# Patient Record
Sex: Male | Born: 1960 | Race: White | Hispanic: No | Marital: Single | Smoking: Current every day smoker
Health system: Southern US, Community
[De-identification: ages and names within clinical notes are randomized; demographics above are authoritative.]

## PROBLEM LIST (undated history)

## (undated) DIAGNOSIS — F419 Anxiety disorder, unspecified: Secondary | ICD-10-CM

## (undated) DIAGNOSIS — E119 Type 2 diabetes mellitus without complications: Secondary | ICD-10-CM

## (undated) DIAGNOSIS — I10 Essential (primary) hypertension: Secondary | ICD-10-CM

## (undated) DIAGNOSIS — G373 Acute transverse myelitis in demyelinating disease of central nervous system: Secondary | ICD-10-CM

## (undated) HISTORY — PX: TONSILLECTOMY: SUR1361

## (undated) HISTORY — PX: TOTAL HIP ARTHROPLASTY: SHX124

---
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
## 2008-08-07 ENCOUNTER — Emergency Department: Payer: Self-pay | Admitting: Unknown Physician Specialty
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
## 2020-06-04 ENCOUNTER — Emergency Department: Payer: No Typology Code available for payment source

## 2020-06-04 ENCOUNTER — Encounter: Payer: Self-pay | Admitting: Internal Medicine

## 2020-06-04 ENCOUNTER — Other Ambulatory Visit: Payer: Self-pay

## 2020-06-04 ENCOUNTER — Inpatient Hospital Stay
Admission: EM | Admit: 2020-06-04 | Discharge: 2020-08-02 | DRG: 870 | Disposition: A | Discharge status: Skilled Nursing Facility | Payer: No Typology Code available for payment source | Attending: Internal Medicine | Admitting: Internal Medicine

## 2020-06-04 DIAGNOSIS — N170 Acute kidney failure with tubular necrosis: Secondary | ICD-10-CM | POA: Diagnosis present

## 2020-06-04 DIAGNOSIS — J9601 Acute respiratory failure with hypoxia: Secondary | ICD-10-CM | POA: Diagnosis present

## 2020-06-04 DIAGNOSIS — F419 Anxiety disorder, unspecified: Secondary | ICD-10-CM | POA: Diagnosis present

## 2020-06-04 DIAGNOSIS — F1120 Opioid dependence, uncomplicated: Secondary | ICD-10-CM | POA: Diagnosis present

## 2020-06-04 DIAGNOSIS — I5021 Acute systolic (congestive) heart failure: Secondary | ICD-10-CM | POA: Diagnosis present

## 2020-06-04 DIAGNOSIS — R0603 Acute respiratory distress: Secondary | ICD-10-CM | POA: Diagnosis not present

## 2020-06-04 DIAGNOSIS — N179 Acute kidney failure, unspecified: Secondary | ICD-10-CM

## 2020-06-04 DIAGNOSIS — G931 Anoxic brain damage, not elsewhere classified: Secondary | ICD-10-CM | POA: Diagnosis not present

## 2020-06-04 DIAGNOSIS — J9602 Acute respiratory failure with hypercapnia: Secondary | ICD-10-CM | POA: Diagnosis present

## 2020-06-04 DIAGNOSIS — L8990 Pressure ulcer of unspecified site, unspecified stage: Secondary | ICD-10-CM | POA: Insufficient documentation

## 2020-06-04 DIAGNOSIS — R110 Nausea: Secondary | ICD-10-CM

## 2020-06-04 DIAGNOSIS — E1122 Type 2 diabetes mellitus with diabetic chronic kidney disease: Secondary | ICD-10-CM | POA: Diagnosis present

## 2020-06-04 DIAGNOSIS — E1169 Type 2 diabetes mellitus with other specified complication: Secondary | ICD-10-CM

## 2020-06-04 DIAGNOSIS — N390 Urinary tract infection, site not specified: Secondary | ICD-10-CM

## 2020-06-04 DIAGNOSIS — E1151 Type 2 diabetes mellitus with diabetic peripheral angiopathy without gangrene: Secondary | ICD-10-CM | POA: Diagnosis present

## 2020-06-04 DIAGNOSIS — G928 Other toxic encephalopathy: Secondary | ICD-10-CM | POA: Diagnosis present

## 2020-06-04 DIAGNOSIS — G43909 Migraine, unspecified, not intractable, without status migrainosus: Secondary | ICD-10-CM

## 2020-06-04 DIAGNOSIS — R4182 Altered mental status, unspecified: Secondary | ICD-10-CM | POA: Diagnosis not present

## 2020-06-04 DIAGNOSIS — Z79899 Other long term (current) drug therapy: Secondary | ICD-10-CM

## 2020-06-04 DIAGNOSIS — A419 Sepsis, unspecified organism: Principal | ICD-10-CM | POA: Insufficient documentation

## 2020-06-04 DIAGNOSIS — D696 Thrombocytopenia, unspecified: Secondary | ICD-10-CM | POA: Diagnosis present

## 2020-06-04 DIAGNOSIS — E119 Type 2 diabetes mellitus without complications: Secondary | ICD-10-CM

## 2020-06-04 DIAGNOSIS — E43 Unspecified severe protein-calorie malnutrition: Secondary | ICD-10-CM | POA: Diagnosis not present

## 2020-06-04 DIAGNOSIS — E669 Obesity, unspecified: Secondary | ICD-10-CM | POA: Diagnosis present

## 2020-06-04 DIAGNOSIS — E440 Moderate protein-calorie malnutrition: Secondary | ICD-10-CM | POA: Insufficient documentation

## 2020-06-04 DIAGNOSIS — Z4659 Encounter for fitting and adjustment of other gastrointestinal appliance and device: Secondary | ICD-10-CM

## 2020-06-04 DIAGNOSIS — M545 Low back pain, unspecified: Secondary | ICD-10-CM | POA: Diagnosis not present

## 2020-06-04 DIAGNOSIS — N189 Chronic kidney disease, unspecified: Secondary | ICD-10-CM | POA: Diagnosis present

## 2020-06-04 DIAGNOSIS — F32A Depression, unspecified: Secondary | ICD-10-CM | POA: Diagnosis present

## 2020-06-04 DIAGNOSIS — R1310 Dysphagia, unspecified: Secondary | ICD-10-CM | POA: Diagnosis not present

## 2020-06-04 DIAGNOSIS — Z794 Long term (current) use of insulin: Secondary | ICD-10-CM

## 2020-06-04 DIAGNOSIS — E870 Hyperosmolality and hypernatremia: Secondary | ICD-10-CM | POA: Diagnosis not present

## 2020-06-04 DIAGNOSIS — R338 Other retention of urine: Secondary | ICD-10-CM

## 2020-06-04 DIAGNOSIS — Q549 Hypospadias, unspecified: Secondary | ICD-10-CM

## 2020-06-04 DIAGNOSIS — J440 Chronic obstructive pulmonary disease with acute lower respiratory infection: Secondary | ICD-10-CM | POA: Diagnosis present

## 2020-06-04 DIAGNOSIS — R7401 Elevation of levels of liver transaminase levels: Secondary | ICD-10-CM

## 2020-06-04 DIAGNOSIS — Z01818 Encounter for other preprocedural examination: Secondary | ICD-10-CM

## 2020-06-04 DIAGNOSIS — F4310 Post-traumatic stress disorder, unspecified: Secondary | ICD-10-CM | POA: Diagnosis present

## 2020-06-04 DIAGNOSIS — Z789 Other specified health status: Secondary | ICD-10-CM

## 2020-06-04 DIAGNOSIS — Z66 Do not resuscitate: Secondary | ICD-10-CM | POA: Diagnosis present

## 2020-06-04 DIAGNOSIS — E1165 Type 2 diabetes mellitus with hyperglycemia: Secondary | ICD-10-CM | POA: Diagnosis present

## 2020-06-04 DIAGNOSIS — E871 Hypo-osmolality and hyponatremia: Secondary | ICD-10-CM | POA: Diagnosis not present

## 2020-06-04 DIAGNOSIS — Z833 Family history of diabetes mellitus: Secondary | ICD-10-CM

## 2020-06-04 DIAGNOSIS — Z7189 Other specified counseling: Secondary | ICD-10-CM | POA: Diagnosis not present

## 2020-06-04 DIAGNOSIS — N319 Neuromuscular dysfunction of bladder, unspecified: Secondary | ICD-10-CM

## 2020-06-04 DIAGNOSIS — E876 Hypokalemia: Secondary | ICD-10-CM | POA: Diagnosis not present

## 2020-06-04 DIAGNOSIS — I130 Hypertensive heart and chronic kidney disease with heart failure and stage 1 through stage 4 chronic kidney disease, or unspecified chronic kidney disease: Secondary | ICD-10-CM | POA: Diagnosis present

## 2020-06-04 DIAGNOSIS — Z7984 Long term (current) use of oral hypoglycemic drugs: Secondary | ICD-10-CM

## 2020-06-04 DIAGNOSIS — R509 Fever, unspecified: Secondary | ICD-10-CM

## 2020-06-04 DIAGNOSIS — Z20822 Contact with and (suspected) exposure to covid-19: Secondary | ICD-10-CM | POA: Diagnosis present

## 2020-06-04 DIAGNOSIS — G9341 Metabolic encephalopathy: Secondary | ICD-10-CM

## 2020-06-04 DIAGNOSIS — F1190 Opioid use, unspecified, uncomplicated: Secondary | ICD-10-CM

## 2020-06-04 DIAGNOSIS — Z9911 Dependence on respirator [ventilator] status: Secondary | ICD-10-CM

## 2020-06-04 DIAGNOSIS — R6521 Severe sepsis with septic shock: Secondary | ICD-10-CM | POA: Diagnosis present

## 2020-06-04 DIAGNOSIS — J441 Chronic obstructive pulmonary disease with (acute) exacerbation: Secondary | ICD-10-CM | POA: Diagnosis present

## 2020-06-04 DIAGNOSIS — Z515 Encounter for palliative care: Secondary | ICD-10-CM | POA: Diagnosis not present

## 2020-06-04 DIAGNOSIS — G8929 Other chronic pain: Secondary | ICD-10-CM | POA: Diagnosis not present

## 2020-06-04 DIAGNOSIS — Z860100 Personal history of colon polyps, unspecified: Secondary | ICD-10-CM

## 2020-06-04 DIAGNOSIS — Q558 Other specified congenital malformations of male genital organs: Secondary | ICD-10-CM

## 2020-06-04 DIAGNOSIS — E785 Hyperlipidemia, unspecified: Secondary | ICD-10-CM

## 2020-06-04 DIAGNOSIS — G373 Acute transverse myelitis in demyelinating disease of central nervous system: Secondary | ICD-10-CM | POA: Diagnosis present

## 2020-06-04 DIAGNOSIS — F17200 Nicotine dependence, unspecified, uncomplicated: Secondary | ICD-10-CM | POA: Diagnosis present

## 2020-06-04 DIAGNOSIS — K5900 Constipation, unspecified: Secondary | ICD-10-CM | POA: Diagnosis not present

## 2020-06-04 DIAGNOSIS — Z635 Disruption of family by separation and divorce: Secondary | ICD-10-CM

## 2020-06-04 DIAGNOSIS — Z0189 Encounter for other specified special examinations: Secondary | ICD-10-CM

## 2020-06-04 DIAGNOSIS — L89152 Pressure ulcer of sacral region, stage 2: Secondary | ICD-10-CM | POA: Diagnosis present

## 2020-06-04 DIAGNOSIS — Z931 Gastrostomy status: Secondary | ICD-10-CM | POA: Diagnosis not present

## 2020-06-04 DIAGNOSIS — D631 Anemia in chronic kidney disease: Secondary | ICD-10-CM | POA: Diagnosis present

## 2020-06-04 DIAGNOSIS — Z8249 Family history of ischemic heart disease and other diseases of the circulatory system: Secondary | ICD-10-CM

## 2020-06-04 DIAGNOSIS — K219 Gastro-esophageal reflux disease without esophagitis: Secondary | ICD-10-CM | POA: Diagnosis present

## 2020-06-04 DIAGNOSIS — J189 Pneumonia, unspecified organism: Secondary | ICD-10-CM | POA: Diagnosis present

## 2020-06-04 DIAGNOSIS — Z87440 Personal history of urinary (tract) infections: Secondary | ICD-10-CM

## 2020-06-04 DIAGNOSIS — G894 Chronic pain syndrome: Secondary | ICD-10-CM | POA: Diagnosis present

## 2020-06-04 DIAGNOSIS — Z452 Encounter for adjustment and management of vascular access device: Secondary | ICD-10-CM

## 2020-06-04 DIAGNOSIS — Z7982 Long term (current) use of aspirin: Secondary | ICD-10-CM

## 2020-06-04 DIAGNOSIS — R339 Retention of urine, unspecified: Secondary | ICD-10-CM

## 2020-06-04 DIAGNOSIS — R0602 Shortness of breath: Secondary | ICD-10-CM

## 2020-06-04 DIAGNOSIS — E875 Hyperkalemia: Secondary | ICD-10-CM | POA: Diagnosis not present

## 2020-06-04 DIAGNOSIS — Z09 Encounter for follow-up examination after completed treatment for conditions other than malignant neoplasm: Secondary | ICD-10-CM | POA: Diagnosis not present

## 2020-06-04 HISTORY — DX: Essential (primary) hypertension: I10

## 2020-06-04 HISTORY — DX: Acute transverse myelitis in demyelinating disease of central nervous system: G37.3

## 2020-06-04 HISTORY — DX: Type 2 diabetes mellitus without complications: E11.9

## 2020-06-04 HISTORY — DX: Anxiety disorder, unspecified: F41.9

## 2020-06-04 LAB — LACTIC ACID, PLASMA
Lactic Acid, Venous: 1.2 mmol/L (ref 0.5–1.9)
Lactic Acid, Venous: 3.1 mmol/L (ref 0.5–1.9)

## 2020-06-04 LAB — URINALYSIS, COMPLETE (UACMP) WITH MICROSCOPIC
Bilirubin Urine: NEGATIVE
Glucose, UA: 150 mg/dL — AB
Ketones, ur: 80 mg/dL — AB
Leukocytes,Ua: NEGATIVE
Nitrite: NEGATIVE
Protein, ur: >=300 mg/dL — AB
Specific Gravity, Urine: 1.023 (ref 1.005–1.030)
pH: 5 (ref 5.0–8.0)

## 2020-06-04 LAB — COMPREHENSIVE METABOLIC PANEL
ALT: 11 U/L (ref 0–44)
AST: 14 U/L — ABNORMAL LOW (ref 15–41)
Albumin: 3.4 g/dL — ABNORMAL LOW (ref 3.5–5.0)
Alkaline Phosphatase: 61 U/L (ref 38–126)
Anion gap: 14 (ref 5–15)
BUN: 20 mg/dL (ref 8–23)
CO2: 21 mmol/L — ABNORMAL LOW (ref 22–32)
Calcium: 8.8 mg/dL — ABNORMAL LOW (ref 8.9–10.3)
Chloride: 95 mmol/L — ABNORMAL LOW (ref 98–111)
Creatinine, Ser: 1.06 mg/dL (ref 0.61–1.24)
GFR, Estimated: >60 mL/min (ref >60)
Glucose, Bld: 249 mg/dL — ABNORMAL HIGH (ref 70–99)
Potassium: 3.2 mmol/L — ABNORMAL LOW (ref 3.5–5.1)
Sodium: 130 mmol/L — ABNORMAL LOW (ref 135–145)
Total Bilirubin: 1 mg/dL (ref 0.3–1.2)
Total Protein: 6.9 g/dL (ref 6.5–8.1)

## 2020-06-04 LAB — URINE DRUG SCREEN, QUALITATIVE (ARMC ONLY)
Amphetamines, Ur Screen: NOT DETECTED
Barbiturates, Ur Screen: NOT DETECTED
Benzodiazepine, Ur Scrn: NOT DETECTED
Cannabinoid 50 Ng, Ur ~~LOC~~: NOT DETECTED
Cocaine Metabolite,Ur ~~LOC~~: NOT DETECTED
MDMA (Ecstasy)Ur Screen: NOT DETECTED
Methadone Scn, Ur: NOT DETECTED
Opiate, Ur Screen: POSITIVE — AB
Phencyclidine (PCP) Ur S: NOT DETECTED
Tricyclic, Ur Screen: POSITIVE — AB

## 2020-06-04 LAB — RESP PANEL BY RT-PCR (FLU A&B, COVID) ARPGX2
Influenza A by PCR: NEGATIVE
Influenza B by PCR: NEGATIVE
SARS Coronavirus 2 by RT PCR: NEGATIVE

## 2020-06-04 LAB — CBC
HCT: 38.9 % — ABNORMAL LOW (ref 39.0–52.0)
Hemoglobin: 13.6 g/dL (ref 13.0–17.0)
MCH: 30 pg (ref 26.0–34.0)
MCHC: 35 g/dL (ref 30.0–36.0)
MCV: 85.9 fL (ref 80.0–100.0)
Platelets: 165 10*3/uL (ref 150–400)
RBC: 4.53 MIL/uL (ref 4.22–5.81)
RDW: 13.8 % (ref 11.5–15.5)
WBC: 13.6 10*3/uL — ABNORMAL HIGH (ref 4.0–10.5)
nRBC: 0 % (ref 0.0–0.2)

## 2020-06-04 LAB — AMMONIA: Ammonia: 15 umol/L (ref 9–35)

## 2020-06-04 LAB — PROCALCITONIN: Procalcitonin: 0.45 ng/mL

## 2020-06-04 LAB — ETHANOL: Alcohol, Ethyl (B): <10 mg/dL (ref <10)

## 2020-06-04 IMAGING — CR DG CHEST 1V
1 series · 1 of 1 positions shown · non-contrast
Comparison: [DATE]

CLINICAL DATA: Altered mental status

EXAM:
CHEST  1 VIEW

[chest pa]
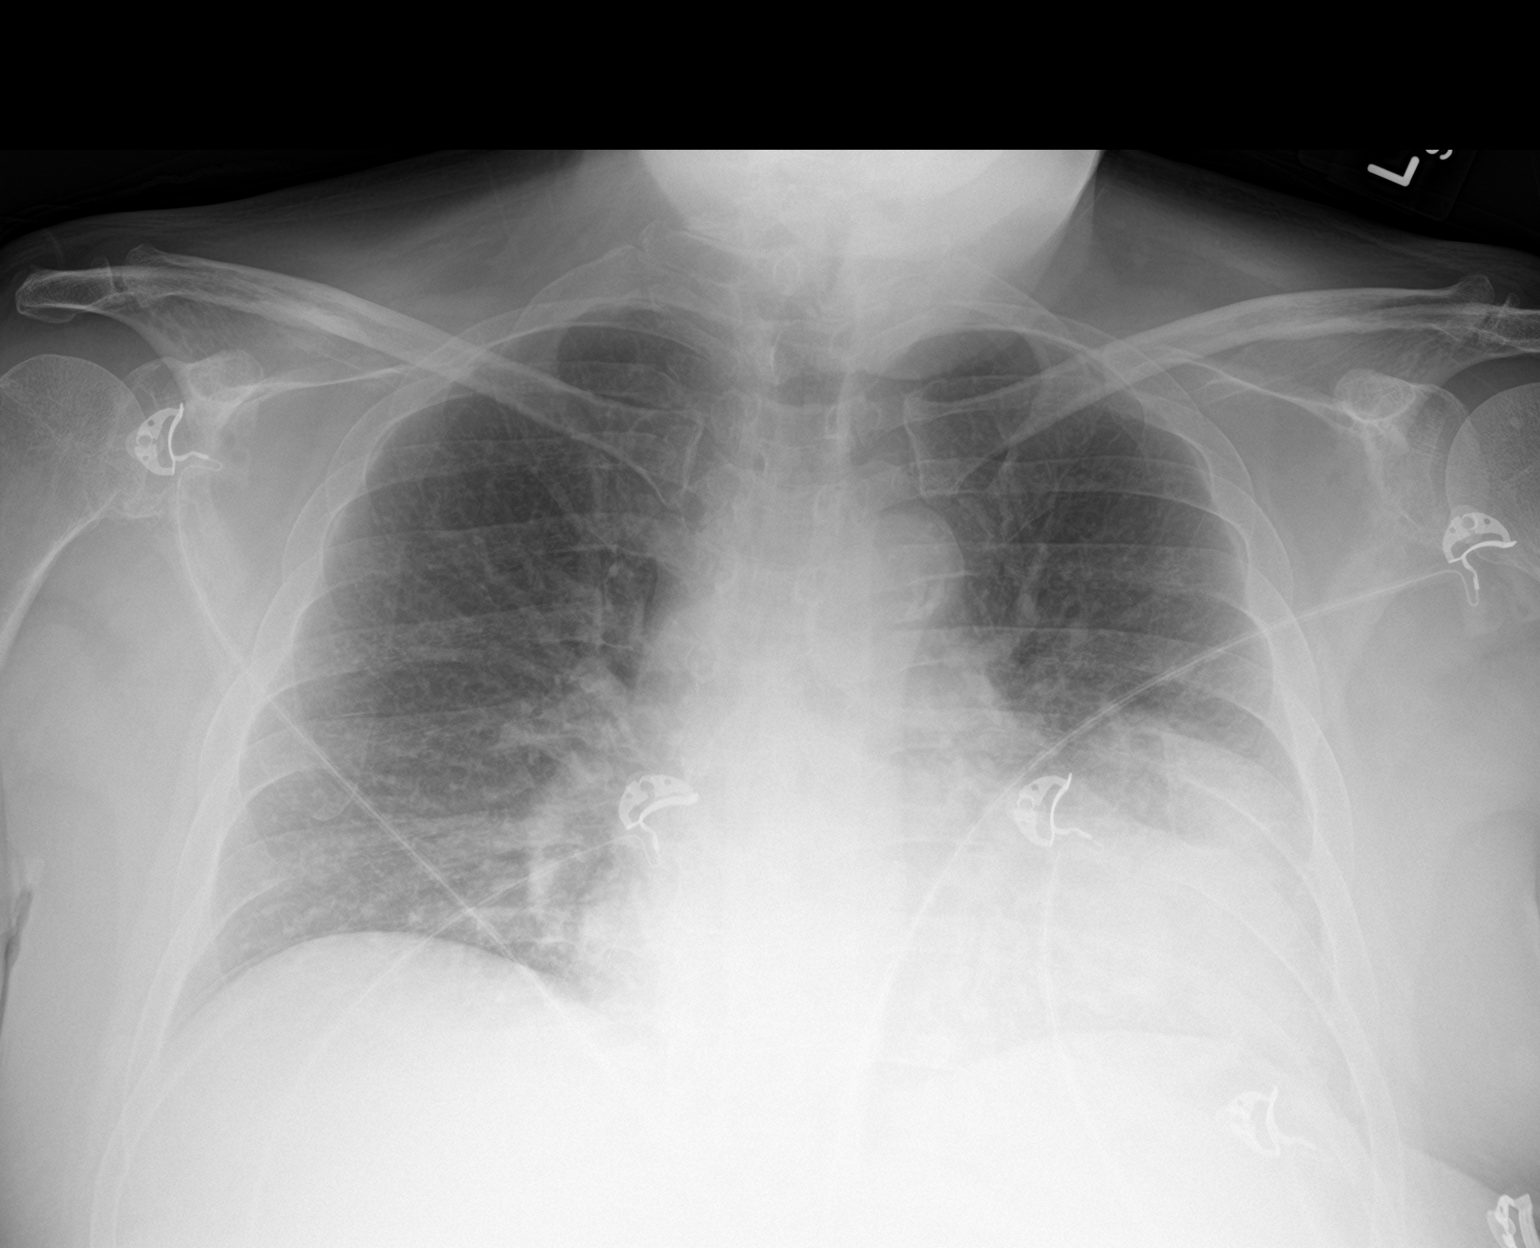

[1 of 1 positions shown; findings below may reference images not displayed]

FINDINGS: Cardiac shadow is enlarged. Aortic calcifications are noted. Diffuse
left basilar airspace opacity is noted consistent with acute
pneumonia. No sizable effusion is seen. No bony abnormality is
noted.
IMPRESSION: Left basilar pneumonia.

## 2020-06-04 IMAGING — CT CT HEAD W/O CM
3 series · 14 of 47 positions shown, 16 images · non-contrast
Comparison: Head CT dated [DATE].

CLINICAL DATA: 60-year-old male with altered mental status.

EXAM:
CT HEAD WITHOUT CONTRAST
CT CERVICAL SPINE WITHOUT CONTRAST
TECHNIQUE: Multidetector CT imaging of the head and cervical spine was
performed following the standard protocol without intravenous
contrast. Multiplanar CT image reconstructions of the cervical spine
were also generated.

[Series 3: head wo · axial · 0.45mm/px · z∈[+130,+265]mm · 8 of 33 slices shown, 10 images]
[im 3/33  brain]
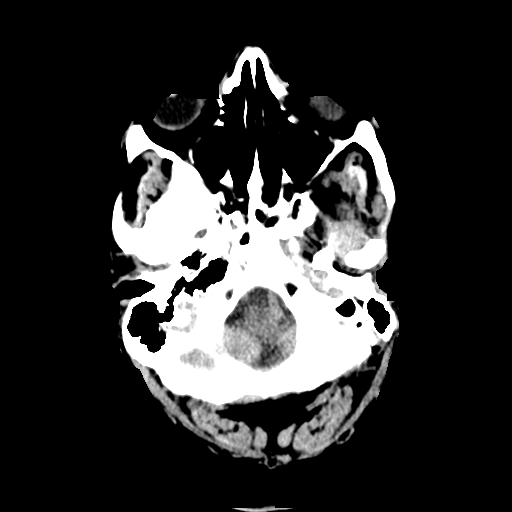
[im 3/33  bone]
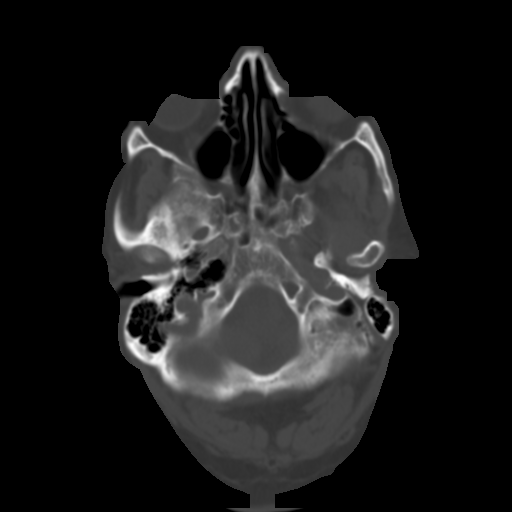
[im 7/33  brain]
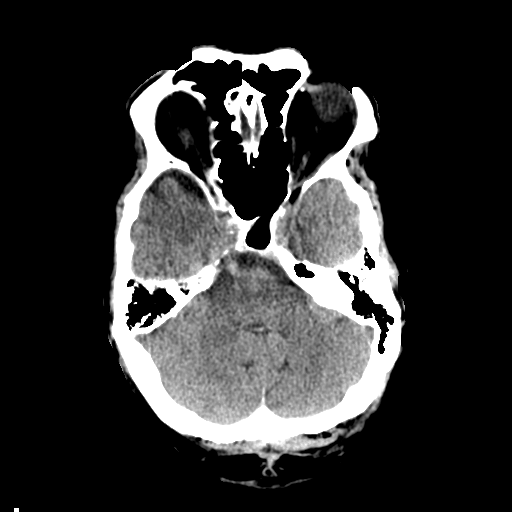
[im 10/33  brain]
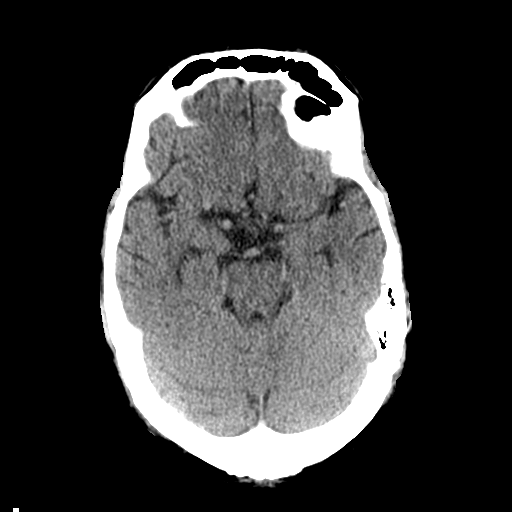
[im 15/33  brain]
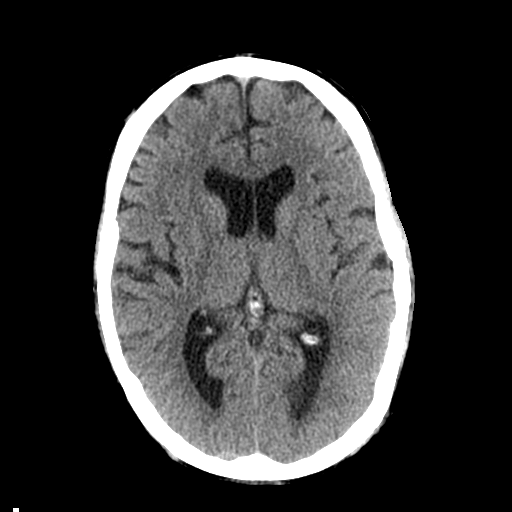
[im 18/33  brain]
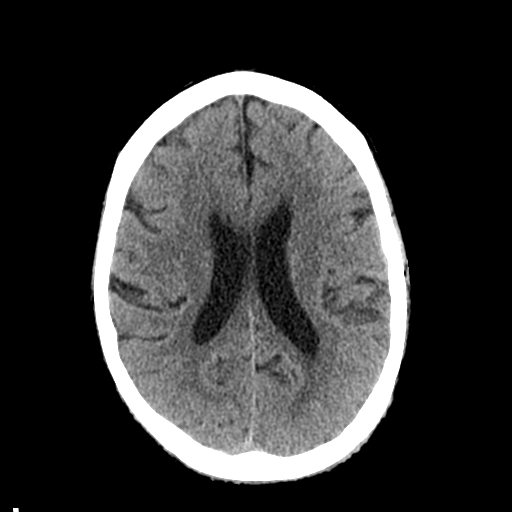
[im 18/33  bone]
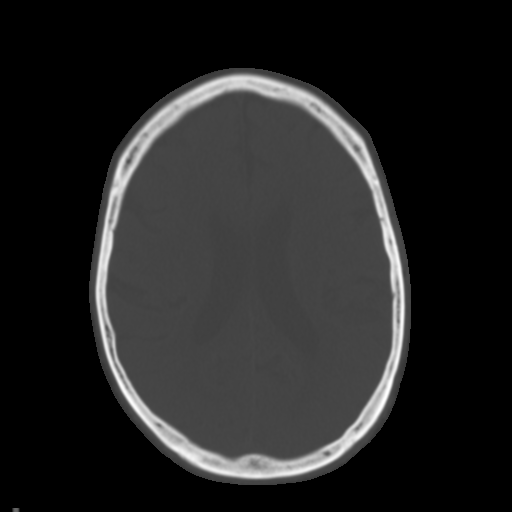
[im 23/33  brain]
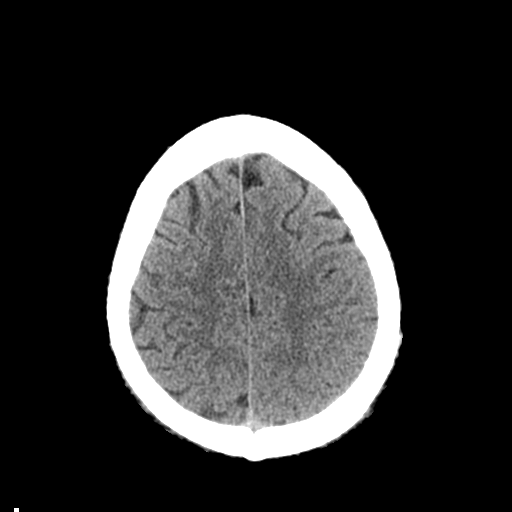
[im 26/33  brain]
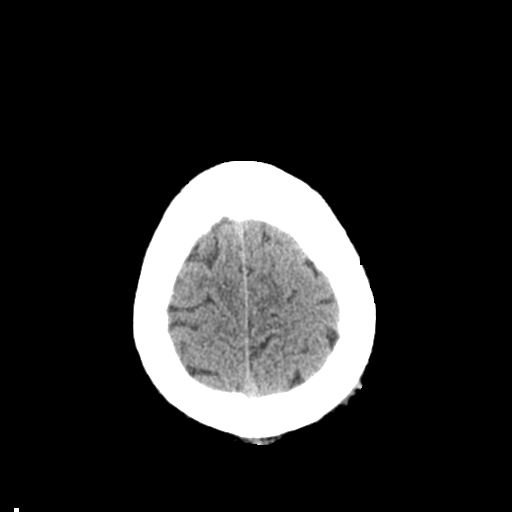
[im 30/33  brain]
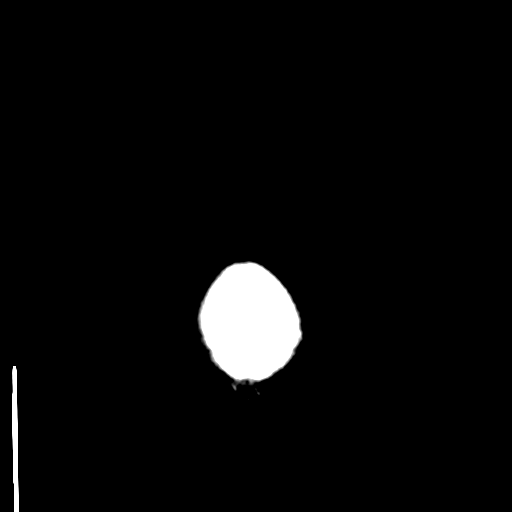

[Series 4: coronal soft tissue · coronal · 0.33mm/px · 3 of 65 slices shown]
[im 22/65  brain]
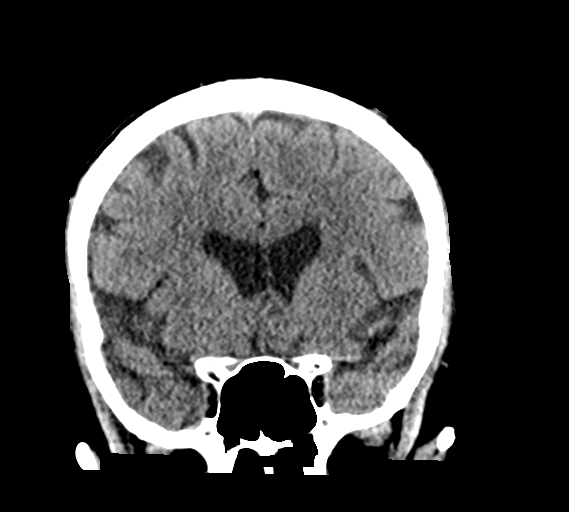
[im 29/65  brain]
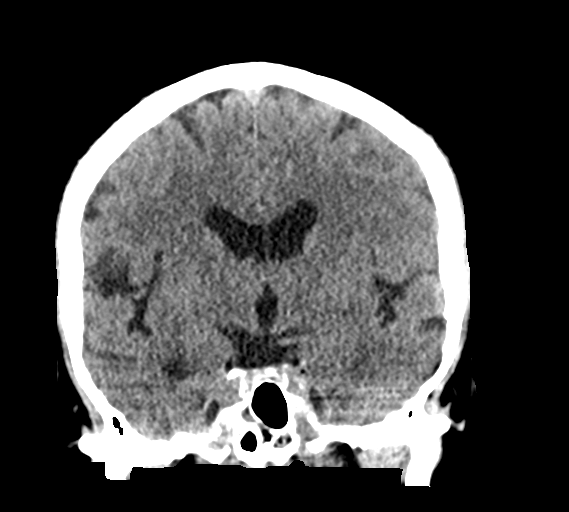
[im 36/65  brain]
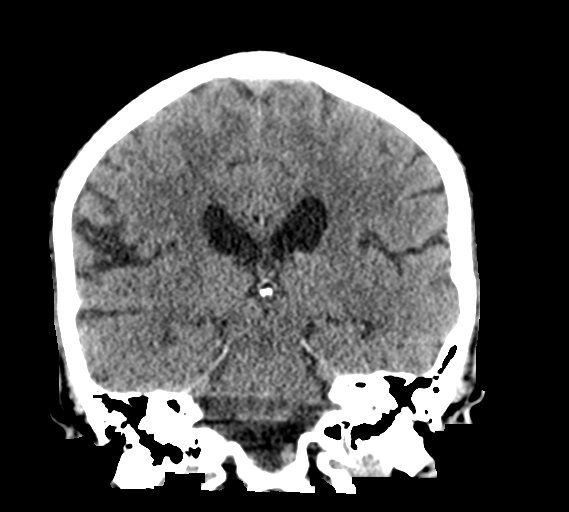

[Series 5: sagittal soft tissue · sagittal · 0.33mm/px · 3 of 55 slices shown]
[im 19/55  brain]
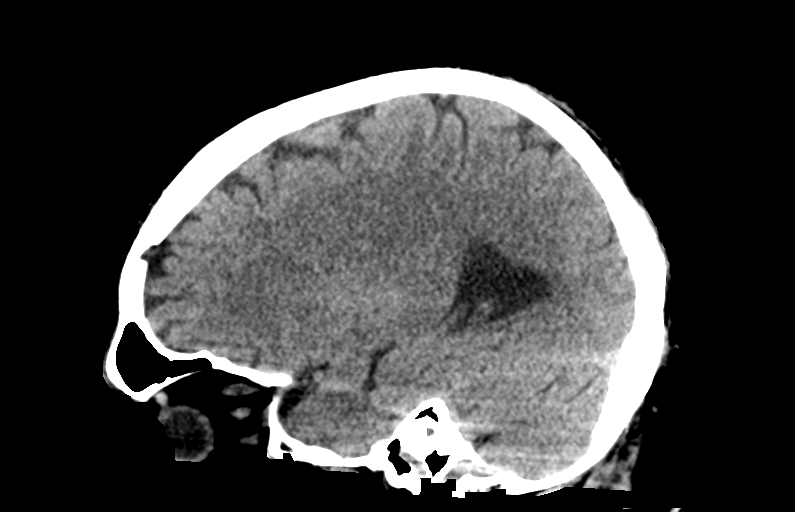
[im 28/55  brain]
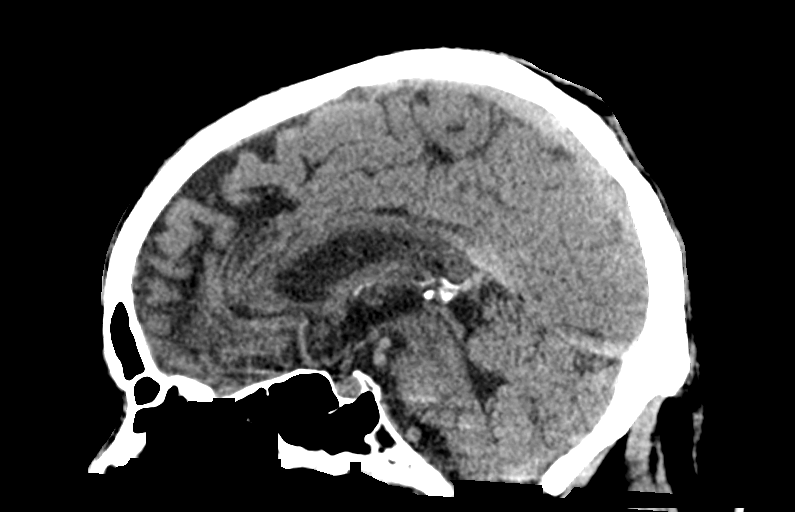
[im 37/55  brain]
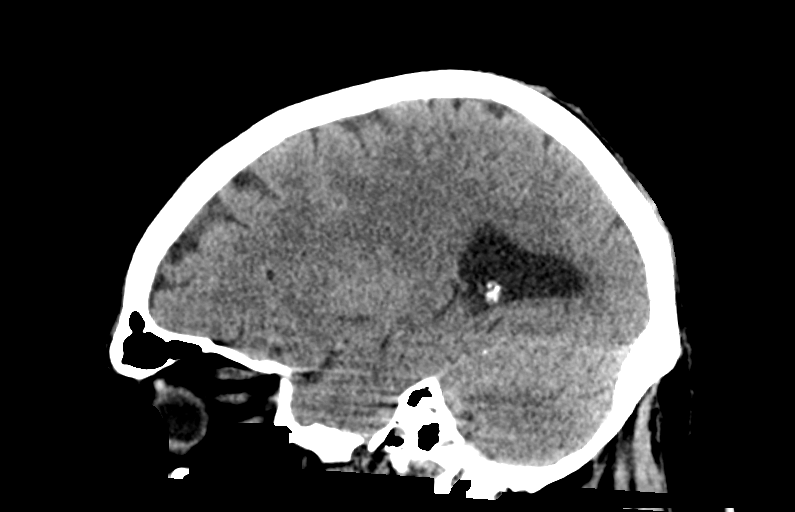

[14 of 47 positions shown; findings below may reference images not displayed]

FINDINGS: CT HEAD FINDINGS

Brain: The ventricles and sulci appropriate size for patient's age.
The gray-white matter discrimination is preserved. There is no acute
intracranial hemorrhage. No mass effect or midline shift. No
extra-axial fluid collection.

Vascular: No hyperdense vessel or unexpected calcification.

Skull: Normal. Negative for fracture or focal lesion.

Sinuses/Orbits: No acute finding.

Other: None

CT CERVICAL SPINE FINDINGS

Alignment: No acute subluxation. There is straightening of normal
cervical lordosis which may be positional or due to muscle spasm.

Skull base and vertebrae: No acute fracture. Osteopenia.

Soft tissues and spinal canal: No prevertebral fluid or swelling. No
visible canal hematoma.

Disc levels: Degenerative changes with disc space narrowing and
endplate irregularity.

Upper chest: Negative.

Other: Exophytic thyroid tissue versus a 15 mm exophytic thyroid or
parathyroid nodule on the right. Recommend thyroid US (ref: [HOSPITAL]. [DATE]): 143-50).
IMPRESSION: 1. No acute intracranial pathology.
2. No acute/traumatic cervical spine pathology.
3. Possible 15 mm exophytic thyroid or parathyroid nodule on the
right. Further evaluation with ultrasound on a
nonemergent/outpatient basis is recommended.

## 2020-06-04 IMAGING — CT CT CHEST-ABD-PELV W/O CM
2 of 4 series · 14 of 36 positions shown, 16 images · non-contrast
Comparison: Chest x-ray [DATE]

CLINICAL DATA: Altered mental status febrile cough

EXAM:
CT CHEST, ABDOMEN AND PELVIS WITHOUT CONTRAST
TECHNIQUE: Multidetector CT imaging of the chest, abdomen and pelvis was
performed following the standard protocol without IV contrast.

[Series 2: cap (person_name) (person_name) · axial · 0.89mm/px · z∈[-712,-142]mm · 11 of 138 slices shown, 13 images]
[im 12/138  mediastinal]
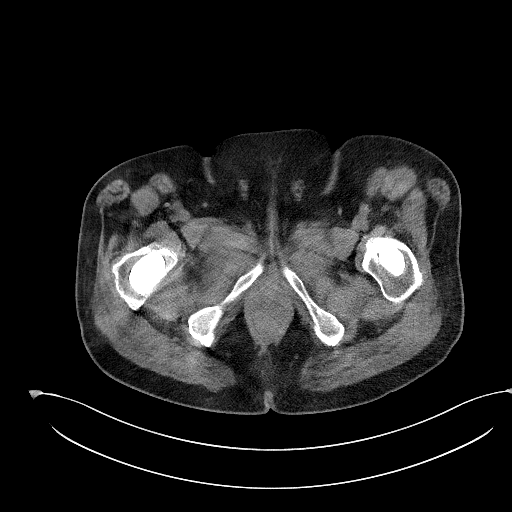
[im 12/138  bone]
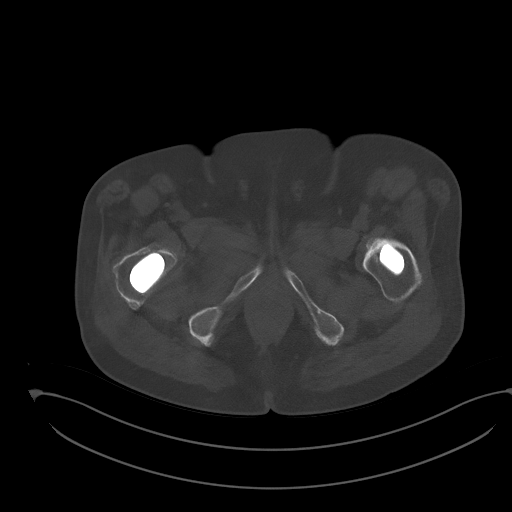
[im 23/138  mediastinal]
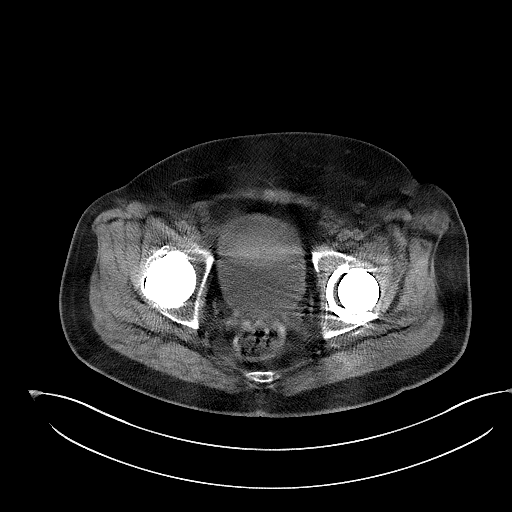
[im 35/138  mediastinal]
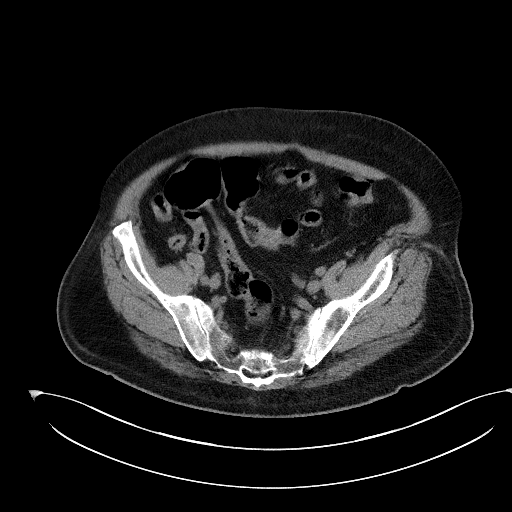
[im 46/138  mediastinal]
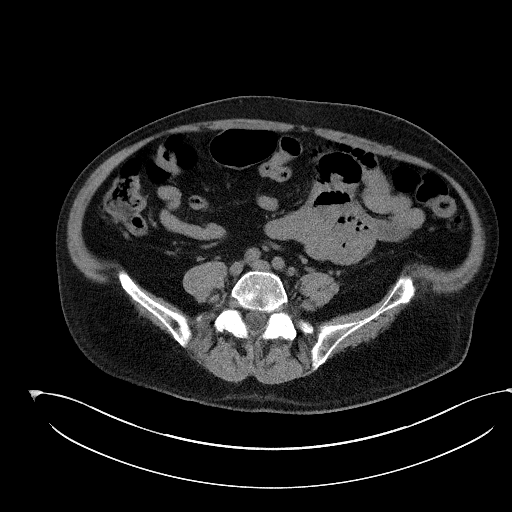
[im 58/138  mediastinal]
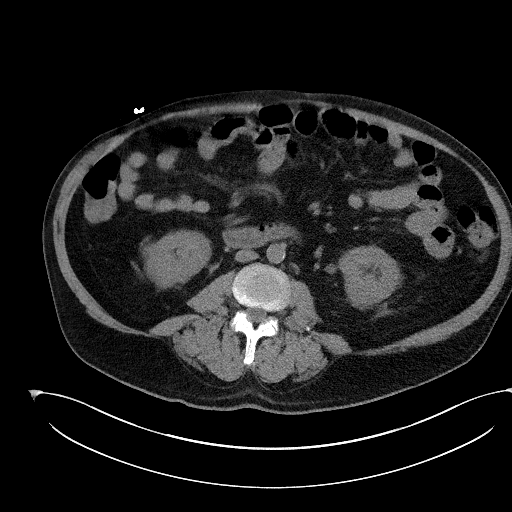
[im 69/138  mediastinal]
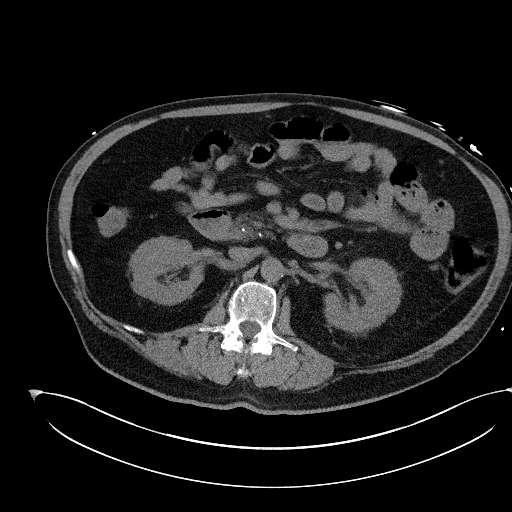
[im 80/138  mediastinal]
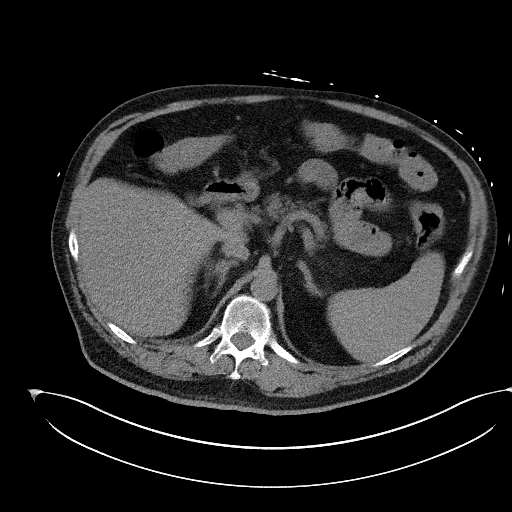
[im 92/138  mediastinal]
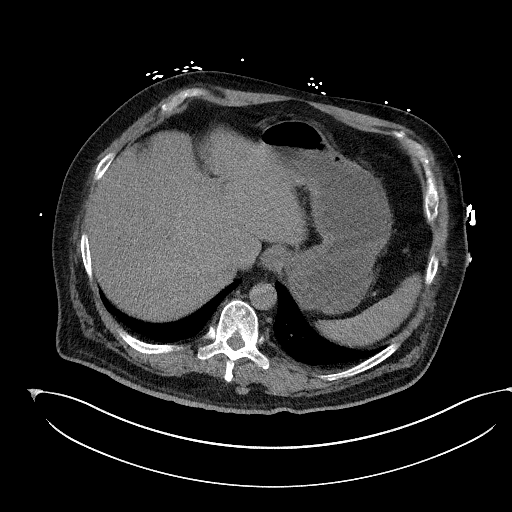
[im 103/138  mediastinal]
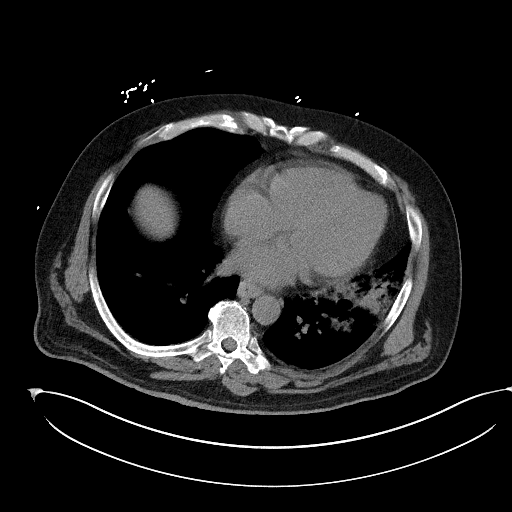
[im 103/138  bone]
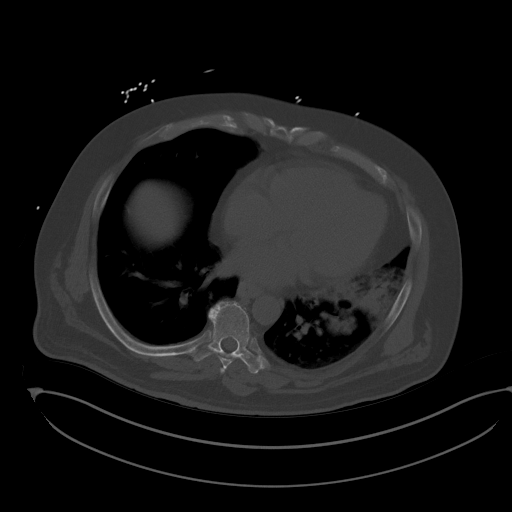
[im 115/138  mediastinal]
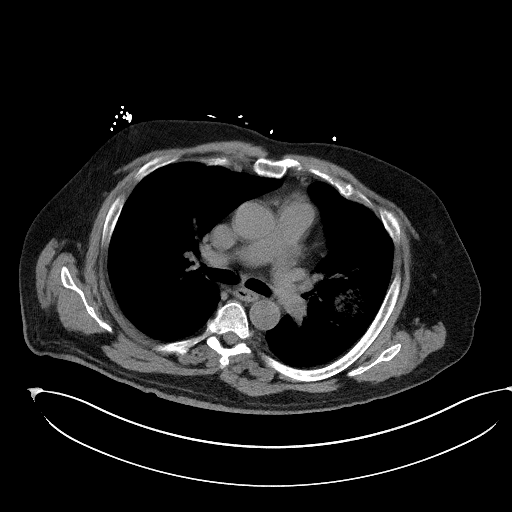
[im 126/138  mediastinal]
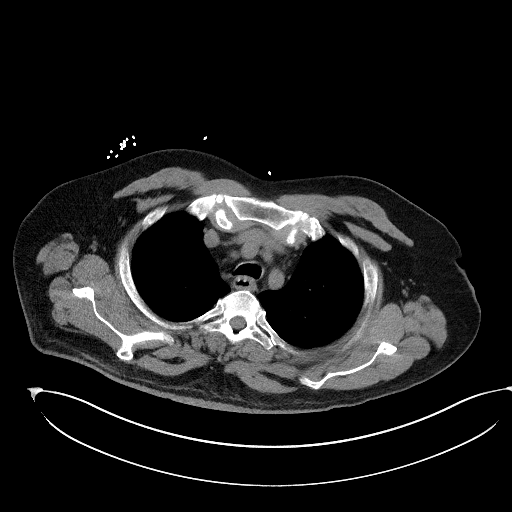

[Series 5: coronal · coronal · 0.98mm/px · 3 of 156 slices shown]
[im 32/156  mediastinal]
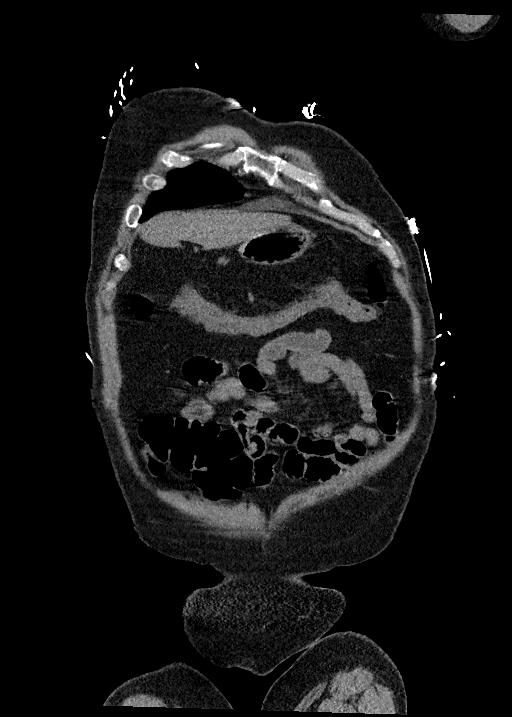
[im 63/156  mediastinal]
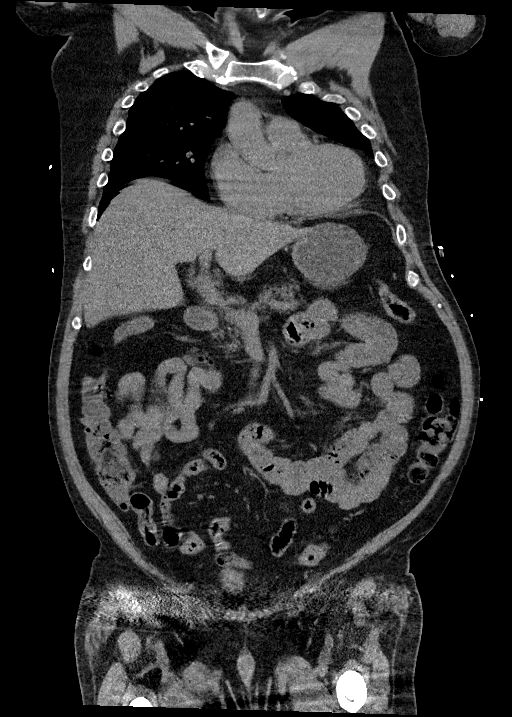
[im 94/156  mediastinal]
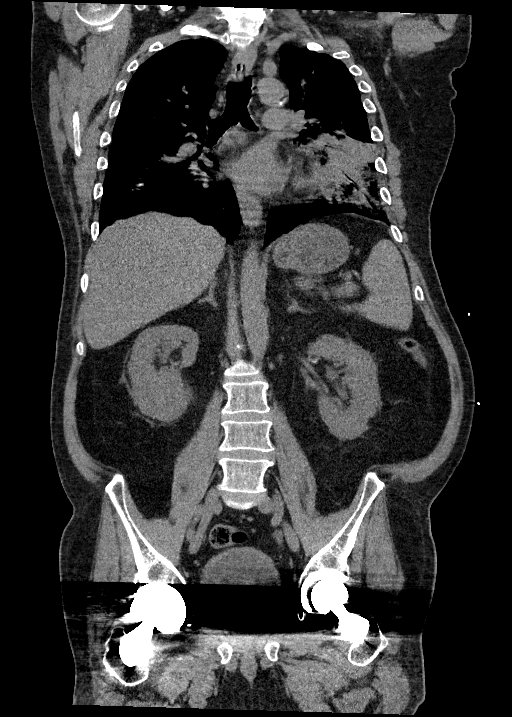

[14 of 36 positions shown; findings below may reference images not displayed]

FINDINGS: CT CHEST FINDINGS

Cardiovascular: Limited evaluation without intravenous contrast.
Mild aortic atherosclerosis without aneurysmal dilatation. Coronary
vascular calcification. Mild cardiomegaly. Trace pericardial
effusion

Mediastinum/Nodes: Midline trachea. No thyroid mass. Multiple
subcentimeter mediastinal lymph nodes. Esophagus within normal
limits

Lungs/Pleura: No pleural effusion or pneumothorax. Consolidation
with ground-glass density in the lingula, posterior left upper lobe
and superior segment of left lower lobe. Multiple small nodules or
nodular foci of airspace disease in the left upper lobe likely
infectious or inflammatory.

Musculoskeletal: Sternum is intact.  No acute osseous abnormality.

CT ABDOMEN PELVIS FINDINGS

Hepatobiliary: No focal liver abnormality is seen. No gallstones,
gallbladder wall thickening, or biliary dilatation.

Pancreas: Unremarkable. No pancreatic ductal dilatation or
surrounding inflammatory changes.

Spleen: Normal in size without focal abnormality.

Adrenals/Urinary Tract: Adrenal glands are normal. Nonspecific
perinephric fat stranding. No hydronephrosis. The bladder is
unremarkable

Stomach/Bowel: Stomach is within normal limits. Appendix appears
normal. No evidence of bowel wall thickening, distention, or
inflammatory changes.

Vascular/Lymphatic: Mild aortic atherosclerosis without aneurysm. No
suspicious nodes.

Reproductive: Prostate is unremarkable.

Other: Negative for free air or free fluid.

Musculoskeletal: Bilateral hip replacements with artifact. No acute
osseous abnormality.
IMPRESSION: 1. Dense consolidation with surrounded ground-glass density in the
left upper and lower lobes consistent with pneumonia. Multiple small
nodules or nodular foci of airspace disease in the left upper lobe
likely infectious or inflammatory. Imaging follow-up to resolution
is recommended.
2. No CT evidence for acute intra-abdominal or intrapelvic
abnormality. There is nonspecific perinephric fat stranding but no
hydronephrosis.
3. Cardiomegaly with trace pericardial effusion

## 2020-06-04 IMAGING — CT CT CERVICAL SPINE W/O CM
3 of 4 series · 10 of 33 positions shown, 12 images · non-contrast
Comparison: Head CT dated [DATE].

CLINICAL DATA: 60-year-old male with altered mental status.

EXAM:
CT HEAD WITHOUT CONTRAST
CT CERVICAL SPINE WITHOUT CONTRAST
TECHNIQUE: Multidetector CT imaging of the head and cervical spine was
performed following the standard protocol without intravenous
contrast. Multiplanar CT image reconstructions of the cervical spine
were also generated.

[Series 6: sagittal bone · sagittal · 0.26mm/px · 5 of 61 slices shown, 6 images]
[im 21/61  bone]
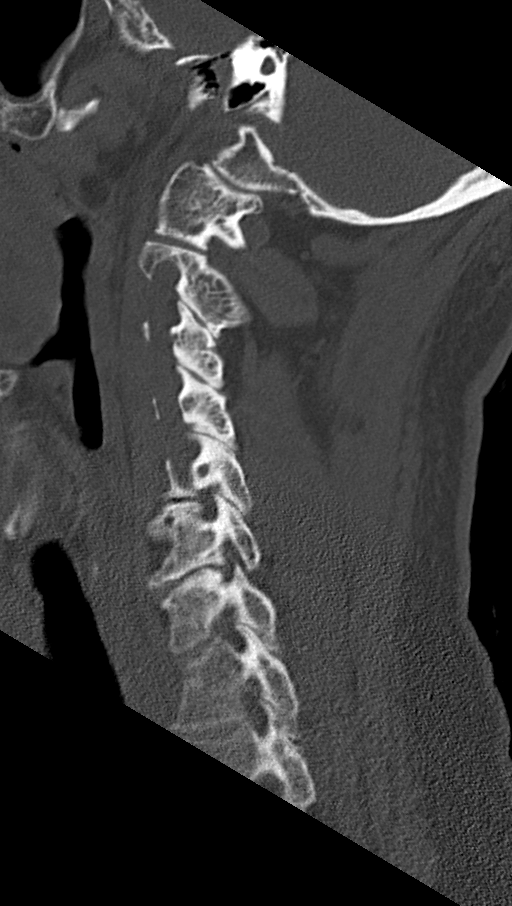
[im 26/61  bone]
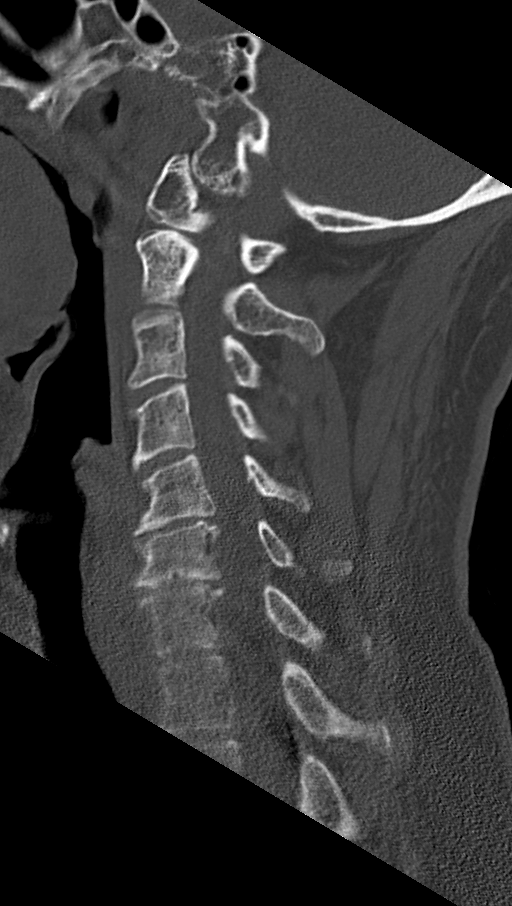
[im 31/61  soft-tissue]
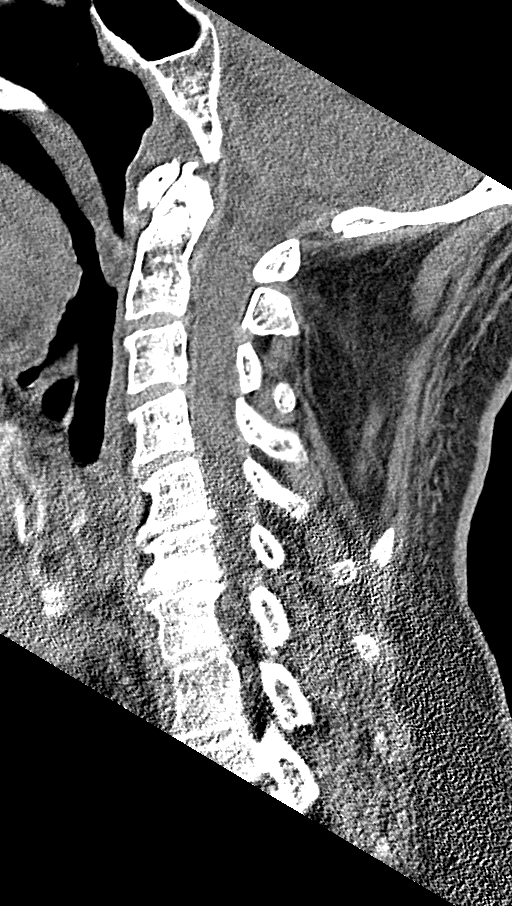
[im 31/61  bone]
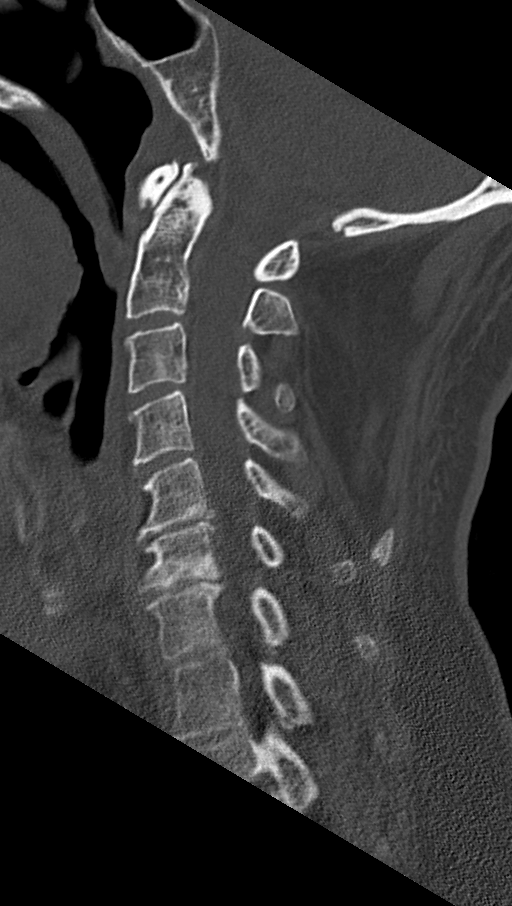
[im 36/61  bone]
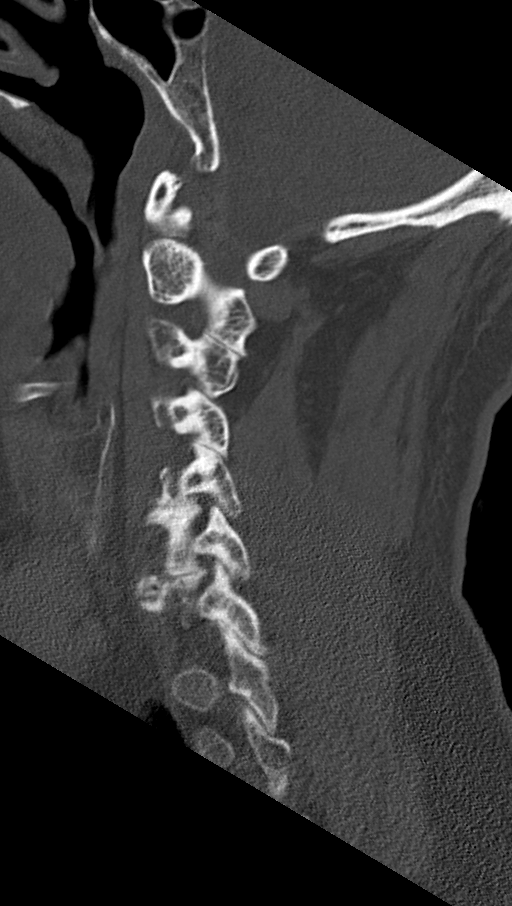
[im 41/61  bone]
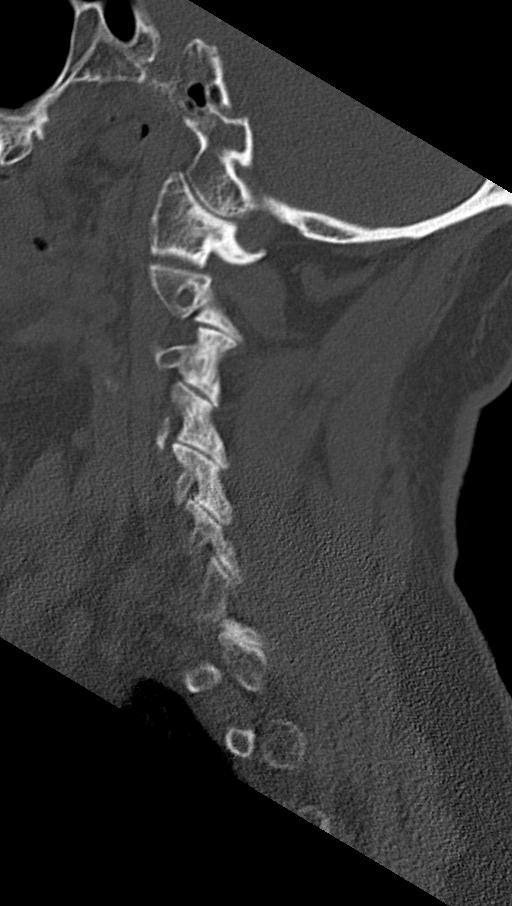

[Series 7: coronal bone · coronal · 0.28mm/px · 3 of 61 slices shown]
[im 15/61  bone]
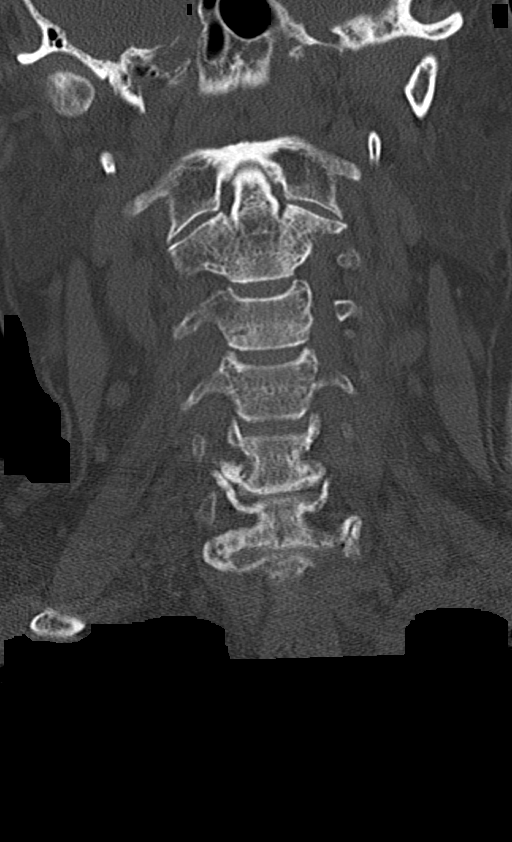
[im 25/61  bone]
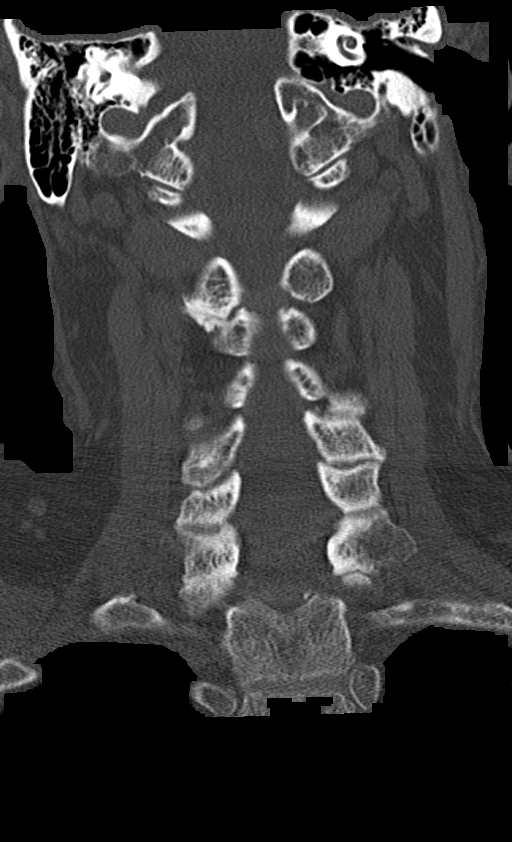
[im 36/61  bone]
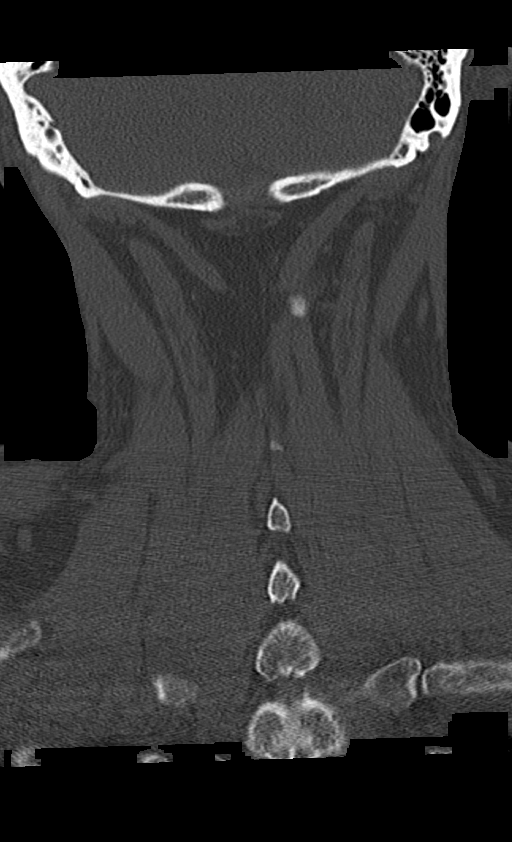

[Series 8: orthogonal bone · axial · 0.25mm/px · z∈[-41,+44]mm · 2 of 112 slices shown, 3 images]
[im 32/112  soft-tissue]
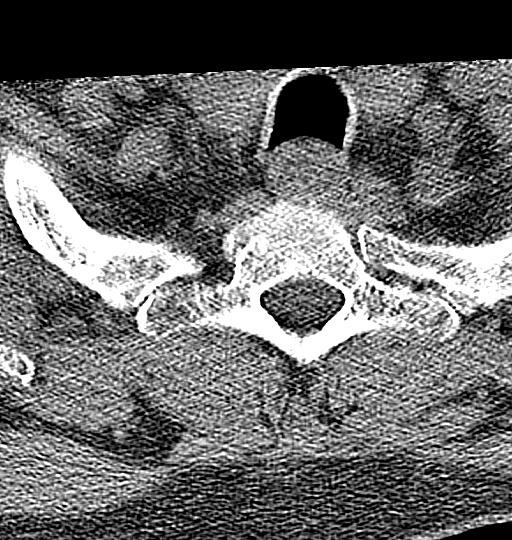
[im 32/112  bone]
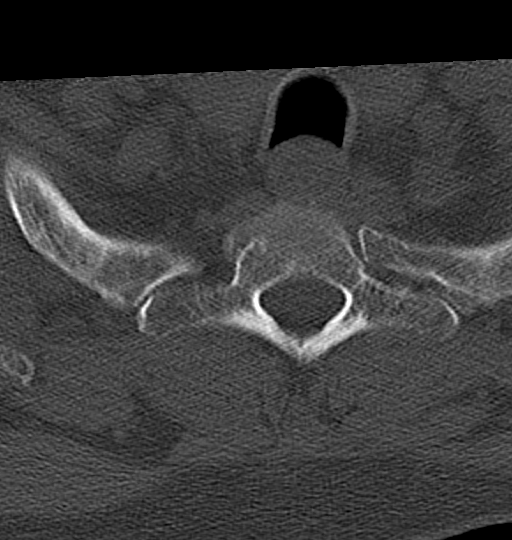
[im 80/112  bone]
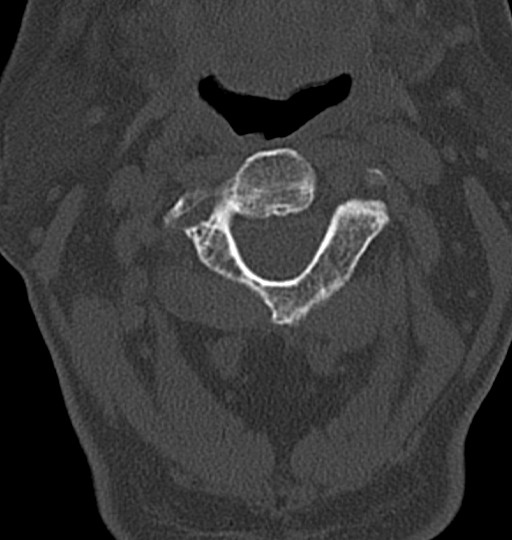

[10 of 33 positions shown; findings below may reference images not displayed]

FINDINGS: CT HEAD FINDINGS

Brain: The ventricles and sulci appropriate size for patient's age.
The gray-white matter discrimination is preserved. There is no acute
intracranial hemorrhage. No mass effect or midline shift. No
extra-axial fluid collection.

Vascular: No hyperdense vessel or unexpected calcification.

Skull: Normal. Negative for fracture or focal lesion.

Sinuses/Orbits: No acute finding.

Other: None

CT CERVICAL SPINE FINDINGS

Alignment: No acute subluxation. There is straightening of normal
cervical lordosis which may be positional or due to muscle spasm.

Skull base and vertebrae: No acute fracture. Osteopenia.

Soft tissues and spinal canal: No prevertebral fluid or swelling. No
visible canal hematoma.

Disc levels: Degenerative changes with disc space narrowing and
endplate irregularity.

Upper chest: Negative.

Other: Exophytic thyroid tissue versus a 15 mm exophytic thyroid or
parathyroid nodule on the right. Recommend thyroid US (ref: [HOSPITAL]. [DATE]): 143-50).
IMPRESSION: 1. No acute intracranial pathology.
2. No acute/traumatic cervical spine pathology.
3. Possible 15 mm exophytic thyroid or parathyroid nodule on the
right. Further evaluation with ultrasound on a
nonemergent/outpatient basis is recommended.

## 2020-06-04 MED ORDER — LACTATED RINGERS IV SOLN: INTRAVENOUS | Status: DC

## 2020-06-04 MED ORDER — ONDANSETRON HCL 4 MG/2ML IJ SOLN: 4.0000 mg | Freq: Four times a day (QID) | INTRAMUSCULAR | Status: DC | PRN

## 2020-06-04 MED ORDER — ONDANSETRON HCL 4 MG PO TABS: 4.0000 mg | ORAL_TABLET | Freq: Four times a day (QID) | ORAL | Status: DC | PRN

## 2020-06-04 MED ORDER — ENOXAPARIN SODIUM 60 MG/0.6ML IJ SOSY
0.5000 mg/kg | PREFILLED_SYRINGE | INTRAMUSCULAR | Status: DC
  Administered 2020-06-04 – 2020-06-08 (×5): 45 mg via SUBCUTANEOUS
  Filled 2020-06-04 (×4): qty 0.45
  Filled 2020-06-04 (×2): qty 0.6

## 2020-06-04 MED ORDER — SODIUM CHLORIDE 0.9 % IV BOLUS (SEPSIS)
1000.0000 mL | Freq: Once | INTRAVENOUS | Status: AC
  Administered 2020-06-04: 1000 mL via INTRAVENOUS

## 2020-06-04 MED ORDER — SODIUM CHLORIDE 0.9 % IV BOLUS (SEPSIS)
500.0000 mL | Freq: Once | INTRAVENOUS | Status: AC
  Administered 2020-06-04: 500 mL via INTRAVENOUS

## 2020-06-04 MED ORDER — VANCOMYCIN HCL IN DEXTROSE 1-5 GM/200ML-% IV SOLN: 1000.0000 mg | Freq: Once | INTRAVENOUS | Status: DC

## 2020-06-04 MED ORDER — ACETAMINOPHEN 650 MG RE SUPP
650.0000 mg | Freq: Four times a day (QID) | RECTAL | Status: DC | PRN
  Administered 2020-06-05 – 2020-06-06 (×4): 650 mg via RECTAL
  Filled 2020-06-04 (×4): qty 1

## 2020-06-04 MED ORDER — METRONIDAZOLE 500 MG/100ML IV SOLN
500.0000 mg | Freq: Once | INTRAVENOUS | Status: AC
  Administered 2020-06-04: 500 mg via INTRAVENOUS
  Filled 2020-06-04: qty 100

## 2020-06-04 MED ORDER — SODIUM CHLORIDE 0.9 % IV SOLN
2.0000 g | Freq: Once | INTRAVENOUS | Status: AC
  Administered 2020-06-04: 2 g via INTRAVENOUS
  Filled 2020-06-04: qty 2

## 2020-06-04 MED ORDER — SODIUM CHLORIDE 0.9 % IV SOLN
2.0000 g | INTRAVENOUS | Status: DC
  Administered 2020-06-05 – 2020-06-07 (×3): 2 g via INTRAVENOUS
  Filled 2020-06-04: qty 2
  Filled 2020-06-04 (×3): qty 20

## 2020-06-04 MED ORDER — VANCOMYCIN HCL 1750 MG/350ML IV SOLN
1750.0000 mg | Freq: Once | INTRAVENOUS | Status: AC
  Administered 2020-06-04: 1750 mg via INTRAVENOUS
  Filled 2020-06-04: qty 350

## 2020-06-04 MED ORDER — INSULIN ASPART 100 UNIT/ML IJ SOLN
0.0000 [IU] | INTRAMUSCULAR | Status: DC
  Administered 2020-06-05: 5 [IU] via SUBCUTANEOUS
  Administered 2020-06-05: 2 [IU] via SUBCUTANEOUS
  Administered 2020-06-05: 5 [IU] via SUBCUTANEOUS
  Administered 2020-06-05: 3 [IU] via SUBCUTANEOUS
  Administered 2020-06-05: 5 [IU] via SUBCUTANEOUS
  Administered 2020-06-06: 3 [IU] via SUBCUTANEOUS
  Administered 2020-06-06: 5 [IU] via SUBCUTANEOUS
  Administered 2020-06-06: 3 [IU] via SUBCUTANEOUS
  Administered 2020-06-06: 5 [IU] via SUBCUTANEOUS
  Administered 2020-06-06 – 2020-06-07 (×3): 3 [IU] via SUBCUTANEOUS
  Administered 2020-06-07 (×2): 2 [IU] via SUBCUTANEOUS
  Administered 2020-06-07 – 2020-06-08 (×2): 3 [IU] via SUBCUTANEOUS
  Administered 2020-06-08: 5 [IU] via SUBCUTANEOUS
  Administered 2020-06-08 (×2): 3 [IU] via SUBCUTANEOUS
  Administered 2020-06-08: 2 [IU] via SUBCUTANEOUS
  Administered 2020-06-08 – 2020-06-09 (×2): 3 [IU] via SUBCUTANEOUS
  Administered 2020-06-09 (×2): 2 [IU] via SUBCUTANEOUS
  Administered 2020-06-09 (×3): 3 [IU] via SUBCUTANEOUS
  Administered 2020-06-09: 2 [IU] via SUBCUTANEOUS
  Administered 2020-06-10: 3 [IU] via SUBCUTANEOUS
  Administered 2020-06-10: 2 [IU] via SUBCUTANEOUS
  Administered 2020-06-10 (×2): 5 [IU] via SUBCUTANEOUS
  Administered 2020-06-10: 3 [IU] via SUBCUTANEOUS
  Filled 2020-06-04 (×30): qty 1

## 2020-06-04 MED ORDER — ACETAMINOPHEN 325 MG PO TABS
650.0000 mg | ORAL_TABLET | Freq: Four times a day (QID) | ORAL | Status: DC | PRN
  Administered 2020-06-04: 650 mg via ORAL
  Filled 2020-06-04: qty 2

## 2020-06-04 MED ORDER — SODIUM CHLORIDE 0.9 % IV SOLN
500.0000 mg | INTRAVENOUS | Status: AC
  Administered 2020-06-05 – 2020-06-06 (×3): 500 mg via INTRAVENOUS
  Filled 2020-06-04 (×4): qty 500

## 2020-06-04 MED ORDER — ENOXAPARIN SODIUM 40 MG/0.4ML IJ SOSY: 40.0000 mg | PREFILLED_SYRINGE | INTRAMUSCULAR | Status: DC

## 2020-06-04 MED ORDER — HYDROCODONE-ACETAMINOPHEN 5-325 MG PO TABS: 1.0000 | ORAL_TABLET | ORAL | Status: DC | PRN

## 2020-06-04 NOTE — ED Triage Notes (Signed)
Pt to ED [facility] for AMS x3 days. Normally A&O x4.  Febrile with EMS.  Cough present.  4mg  zofran given PTA, 20g to right hand, 500 mL NS given PTA  Brother reports recurrent UTIs  Pt not answering all questions appropriately. Oriented to person. Cannot state situation or place. Localizes pain to neck. Unsure of fall, just states "I need help"   Triage and hx limited d/t AMS

## 2020-06-04 NOTE — ED Notes (Signed)
Pt in CT.

## 2020-06-04 NOTE — H&P (Addendum)
History and Physical    [employer] MMI:[id number] DOB: 04-Aug-1960 DOA: 06/04/2020  PCP: System, Provider Not In   Patient coming from: Home  I have personally briefly reviewed patient's old medical records in [facility]  Chief Complaint: Confusion  HPI: Wyatt Ball. is a 60 y.o. 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  History taken from his brother over the phone. Patient had a ill with a cough, shortness of breath and fever as well as vomiting and diarrhea and started becoming increasingly confused in the 24 hours prior to arrival.  He had no complaints of chest pain but said everything hurt.  Unable to get any meaningful history from patient. ED course: On arrival, temperature 99.3, BP 167/67, pulse 99, respirations 18 with O2 sat 96% on room air.  Blood work significant for leukocytosis of 13,600 with lactic acid of 1.2.  Ammonia level normal at 15, EtOH less than 10.  Sodium 130, potassium 3.2, glucose 249 creatinine 1.06.  Urinalysis with 80 ketones and rare bacteria.  UDS positive for opiates and tricyclic's.  COVID and flu negative EKG, personally viewed and interpreted: Sinus rhythm at 91 with no acute ST-T wave changes Imaging: CT head and C-spine no acute intracranial or cervical spine pathology.  15 mm exophytic thyroid or parathyroid nodule on the right CT chest abdomen and pelvis without contrast consistent with pneumonia.  Multiple nodules left upper lobe likely infectious or inflammatory with follow-up imaging recommended to document resolution.  No intra-abdominal or intrapelvic abnormality.   Cardiomegaly with trace pericardial effusion  Patient was started on sepsis fluids and broad-spectrum antibiotics initially for sepsis of unknown source.  Hospitalist consulted for admission.    Review of Systems: Unable to get any meaningful history from patient due to confusion   Past Medical History:  Diagnosis Date  . Anxiety   . DM (diabetes mellitus) (HCC)   . HTN (hypertension)   . Transverse myelitis [facility])     Past Surgical History:  Procedure Laterality Date  . TONSILLECTOMY    . TOTAL HIP ARTHROPLASTY       reports that he has been smoking. He has never used smokeless tobacco. He reports that he does not drink alcohol. No history on file for drug use.  No Known Allergies  Family History  Problem Relation Age of Onset  . CAD Mother   . Diabetes Mother   . CAD Father   . Diabetes Father   . CAD Brother   . Diabetes Brother       Prior to Admission medications   Not on File    Physical Exam: Vitals:   06/04/20 1730 06/04/20 1830 06/04/20 1838 06/04/20 2000  BP: (!) 171/67  (!) 175/82 (!) 171/82  Pulse: 99 93 95 94  Resp: 16 15 18 20   Temp:      TempSrc:      SpO2: 96% 97% 97% 97%  Weight:      Height:         Vitals:   06/04/20 1730 06/04/20 1830 06/04/20 1838 06/04/20 2000  BP: (!) 171/67  (!) 175/82 (!) 171/82  Pulse: 99  93 95 94  Resp: 16 15 18 20   Temp:      TempSrc:      SpO2: 96% 97% 97% 97%  Weight:      Height:        Constitutional:  Awake, restless in bed, oriented to person only. Not in any apparent distress HEENT:      Head: Normocephalic and atraumatic.         Eyes: PERLA, EOMI, Conjunctivae are normal. Sclera is non-icteric.       Mouth/Throat: Mucous membranes are moist.       Neck: Supple with no signs of meningismus. Cardiovascular: Regular rate and rhythm. No murmurs, gallops, or rubs. 2+ symmetrical distal pulses are present . No JVD. No LE edema Respiratory: Respiratory effort increased with diminished lung sounds  bilaterally.  Coarse breath sounds bilaterally.  Gastrointestinal: Soft, non tender, and non distended with positive bowel sounds.  Genitourinary: No CVA tenderness. Musculoskeletal: Nontender with normal range of motion in all extremities. No cyanosis, or erythema of extremities. Neurologic:  Face is symmetric. Moving all extremities. No gross focal neurologic deficits . Skin: Skin is warm, dry.  No rash or ulcers Psychiatric: Restless, mildly agitated   Labs on Admission: I have personally reviewed following labs and imaging studies  CBC: Recent Labs  Lab 06/04/20 1718  WBC 13.6*  HGB 13.6  HCT 38.9*  MCV 85.9  PLT 165   Basic Metabolic Panel: Recent Labs  Lab 06/04/20 1718  NA 130*  K 3.2*  CL 95*  CO2 21*  GLUCOSE 249*  BUN 20  CREATININE 1.06  CALCIUM 8.8*   GFR: Estimated Creatinine Clearance: 77.2 mL/min (by C-G formula based on SCr of 1.06 mg/dL). Liver Function Tests: Recent Labs  Lab 06/04/20 1718  AST 14*  ALT 11  ALKPHOS 61  BILITOT 1.0  PROT 6.9  ALBUMIN 3.4*   No results for input(s): LIPASE, AMYLASE in the last 168 hours. Recent Labs  Lab 06/04/20 1718  AMMONIA 15   Coagulation Profile: No results for input(s): INR, PROTIME in the last 168 hours. Cardiac Enzymes: No results for input(s): CKTOTAL, CKMB, CKMBINDEX, TROPONINI in the last 168 hours. BNP (last 3 results) No results for input(s): PROBNP in the last 8760 hours. HbA1C: No results for input(s): HGBA1C in the last 72 hours. CBG: No results for input(s): GLUCAP in the last 168 hours. Lipid Profile: No results for input(s): CHOL, HDL, LDLCALC, TRIG, CHOLHDL, LDLDIRECT in the last 72 hours. Thyroid Function Tests: No results for input(s): TSH, T4TOTAL, FREET4, T3FREE, THYROIDAB in the last 72 hours. Anemia Panel: No results for input(s): VITAMINB12, FOLATE, FERRITIN, TIBC, IRON, RETICCTPCT in the last 72 hours. Urine analysis:    Component Value Date/Time   COLORURINE YELLOW  (A) 06/04/2020 1942   APPEARANCEUR HAZY (A) 06/04/2020 1942   LABSPEC 1.023 06/04/2020 1942   PHURINE 5.0 06/04/2020 1942   GLUCOSEU 150 (A) 06/04/2020 1942   HGBUR MODERATE (A) 06/04/2020 1942   BILIRUBINUR NEGATIVE 06/04/2020 1942   KETONESUR 80 (A) 06/04/2020 1942   PROTEINUR >=300 (A) 06/04/2020 1942   NITRITE NEGATIVE 06/04/2020 1942   LEUKOCYTESUR NEGATIVE 06/04/2020 1942    Radiological Exams on Admission: DG Chest 1 View  Result Date: 06/04/2020 CLINICAL DATA:  Altered mental status EXAM: CHEST  1 VIEW COMPARISON:  08/07/2008 FINDINGS: Cardiac shadow is enlarged. Aortic calcifications are noted. Diffuse left basilar airspace opacity is noted consistent with acute pneumonia. No sizable effusion is seen. No bony abnormality is  noted. IMPRESSION: Left basilar pneumonia. Electronically Signed   By: Alcide CleverMark  Lukens M.D.   On: 06/04/2020 18:27   CT Head Wo Contrast  Result Date: 06/04/2020 CLINICAL DATA:  60 year old male with altered mental status. EXAM: CT HEAD WITHOUT CONTRAST CT CERVICAL SPINE WITHOUT CONTRAST TECHNIQUE: Multidetector CT imaging of the head and cervical spine was performed following the standard protocol without intravenous contrast. Multiplanar CT image reconstructions of the cervical spine were also generated. COMPARISON:  Head CT dated 08/07/2008. FINDINGS: CT HEAD FINDINGS Brain: The ventricles and sulci appropriate size for patient's age. The gray-white matter discrimination is preserved. There is no acute intracranial hemorrhage. No mass effect or midline shift. No extra-axial fluid collection. Vascular: No hyperdense vessel or unexpected calcification. Skull: Normal. Negative for fracture or focal lesion. Sinuses/Orbits: No acute finding. Other: None CT CERVICAL SPINE FINDINGS Alignment: No acute subluxation. There is straightening of normal cervical lordosis which may be positional or due to muscle spasm. Skull base and vertebrae: No acute fracture. Osteopenia. Soft  tissues and spinal canal: No prevertebral fluid or swelling. No visible canal hematoma. Disc levels: Degenerative changes with disc space narrowing and endplate irregularity. Upper chest: Negative. Other: Exophytic thyroid tissue versus a 15 mm exophytic thyroid or parathyroid nodule on the right. Recommend thyroid US (ref: J Am Coll Radiol. 2015 Feb;12(2): 143-50). IMPRESSION: 1. No acute intracranial pathology. 2. No acute/traumatic cervical spine pathology. 3. Possible 15 mm exophytic thyroid or parathyroid nodule on the right. Further evaluation with ultrasound on a nonemergent/outpatient basis is recommended. Electronically Signed   By: Elgie CollardArash  Radparvar M.D.   On: 06/04/2020 18:19   CT Cervical Spine Wo Contrast  Result Date: 06/04/2020 CLINICAL DATA:  60 year old male with altered mental status. EXAM: CT HEAD WITHOUT CONTRAST CT CERVICAL SPINE WITHOUT CONTRAST TECHNIQUE: Multidetector CT imaging of the head and cervical spine was performed following the standard protocol without intravenous contrast. Multiplanar CT image reconstructions of the cervical spine were also generated. COMPARISON:  Head CT dated 08/07/2008. FINDINGS: CT HEAD FINDINGS Brain: The ventricles and sulci appropriate size for patient's age. The gray-white matter discrimination is preserved. There is no acute intracranial hemorrhage. No mass effect or midline shift. No extra-axial fluid collection. Vascular: No hyperdense vessel or unexpected calcification. Skull: Normal. Negative for fracture or focal lesion. Sinuses/Orbits: No acute finding. Other: None CT CERVICAL SPINE FINDINGS Alignment: No acute subluxation. There is straightening of normal cervical lordosis which may be positional or due to muscle spasm. Skull base and vertebrae: No acute fracture. Osteopenia. Soft tissues and spinal canal: No prevertebral fluid or swelling. No visible canal hematoma. Disc levels: Degenerative changes with disc space narrowing and endplate  irregularity. Upper chest: Negative. Other: Exophytic thyroid tissue versus a 15 mm exophytic thyroid or parathyroid nodule on the right. Recommend thyroid US (ref: J Am Coll Radiol. 2015 Feb;12(2): 143-50). IMPRESSION: 1. No acute intracranial pathology. 2. No acute/traumatic cervical spine pathology. 3. Possible 15 mm exophytic thyroid or parathyroid nodule on the right. Further evaluation with ultrasound on a nonemergent/outpatient basis is recommended. Electronically Signed   By: Elgie CollardArash  Radparvar M.D.   On: 06/04/2020 18:19   CT CHEST ABDOMEN PELVIS WO CONTRAST  Result Date: 06/04/2020 CLINICAL DATA:  Altered mental status febrile cough EXAM: CT CHEST, ABDOMEN AND PELVIS WITHOUT CONTRAST TECHNIQUE: Multidetector CT imaging of the chest, abdomen and pelvis was performed following the standard protocol without IV contrast. COMPARISON:  Chest x-ray 06/04/2020 FINDINGS: CT CHEST FINDINGS Cardiovascular: Limited evaluation without intravenous contrast. Mild aortic  atherosclerosis without aneurysmal dilatation. Coronary vascular calcification. Mild cardiomegaly. Trace pericardial effusion Mediastinum/Nodes: Midline trachea. No thyroid mass. Multiple subcentimeter mediastinal lymph nodes. Esophagus within normal limits Lungs/Pleura: No pleural effusion or pneumothorax. Consolidation with ground-glass density in the lingula, posterior left upper lobe and superior segment of left lower lobe. Multiple small nodules or nodular foci of airspace disease in the left upper lobe likely infectious or inflammatory. Musculoskeletal: Sternum is intact.  No acute osseous abnormality. CT ABDOMEN PELVIS FINDINGS Hepatobiliary: No focal liver abnormality is seen. No gallstones, gallbladder wall thickening, or biliary dilatation. Pancreas: Unremarkable. No pancreatic ductal dilatation or surrounding inflammatory changes. Spleen: Normal in size without focal abnormality. Adrenals/Urinary Tract: Adrenal glands are normal. Nonspecific  perinephric fat stranding. No hydronephrosis. The bladder is unremarkable Stomach/Bowel: Stomach is within normal limits. Appendix appears normal. No evidence of bowel wall thickening, distention, or inflammatory changes. Vascular/Lymphatic: Mild aortic atherosclerosis without aneurysm. No suspicious nodes. Reproductive: Prostate is unremarkable. Other: Negative for free air or free fluid. Musculoskeletal: Bilateral hip replacements with artifact. No acute osseous abnormality. IMPRESSION: 1. Dense consolidation with surrounded ground-glass density in the left upper and lower lobes consistent with pneumonia. Multiple small nodules or nodular foci of airspace disease in the left upper lobe likely infectious or inflammatory. Imaging follow-up to resolution is recommended. 2. No CT evidence for acute intra-abdominal or intrapelvic abnormality. There is nonspecific perinephric fat stranding but no hydronephrosis. 3. Cardiomegaly with trace pericardial effusion Electronically Signed   By: Jasmine Pang M.D.   On: 06/04/2020 19:33     Assessment/Plan 60 year old male with a history of HTN, PTSD, diabetes, migraines, GERD, neurogenic bladder who self catheterizes , frequent UTIs, chronic back pain presenting with cough fever vomiting diarrhea and altered mental status    Sepsis secondary to community-acquired pneumonia    Acute metabolic encephalopathy - Sepsis criteria includes fever, tachycardia leukocytosis, lactic acidosis with left upper and lower lobe pneumonia on chest CT and acute mental status changes - COVID and flu test were negative - Received Vanco, cefepime and Flagyl in the emergency room along with sepsis fluid bolus - Continue sepsis fluids - IV Rocephin and azithromycin - Follow blood cultures - Aspiration precautions, fall precautions - Neurologic checks    Neurogenic bladder post transverse myelitis    History of frequent UTIs    History of transverse myelitis - Patient self  catheterizes - We will place Foley catheter given patient's acute confusion and sepsis presentation - Urinalysis without pyuria    Chronic, continuous use of opioids   Chronic low back pain - Patient opioid dependent - Pain control - Some concern for possible opioid withdrawal symptoms contributing to confusion    Type 2 diabetes mellitus without complication (HCC) - Sliding scale insulin coverage    History of colon polyps - Patient had a recent colonoscopy 4/27 at the [facility] with tubular adenoma  Nicotine dependence - Nicotine patch    DVT prophylaxis: Lovenox  Code Status: full code  Family Communication: Brother over the phone Disposition Plan: Back to previous home environment Consults called: none  Status:At the time of admission, it appears that the appropriate admission status for this patient is INPATIENT. This is judged to be reasonable and necessary in order to provide the required intensity of service to ensure the patient's safety given the presenting symptoms, physical exam findings, and initial radiographic and laboratory data in the context of their  Comorbid conditions.   Patient requires inpatient status due to high intensity of service, high risk  for further deterioration and high frequency of surveillance required.   I certify that at the point of admission it is my clinical judgment that the patient will require inpatient hospital care spanning beyond 2 midnights     Andris Baumann MD [facility]     06/04/2020, 10:09 PM

## 2020-06-04 NOTE — ED Notes (Signed)
Pt placed on bed alarm

## 2020-06-04 NOTE — Sepsis Progress Note (Signed)
Following for sepsis monitoring ?

## 2020-06-04 NOTE — Progress Notes (Signed)
CODE SEPSIS - PHARMACY COMMUNICATION  **Broad Spectrum Antibiotics should be administered within 1 hour of Sepsis diagnosis**  Time Code Sepsis Called/Page Received: 1900  Antibiotics Ordered: Vancomycin, Cefepime, Flagyl  Time of 1st antibiotic administration: 1945  Additional action taken by pharmacy: Contacted RN about starting Cefepime    Gardner Candle, PharmD, BCPS Clinical Pharmacist 06/04/2020 7:18 PM

## 2020-06-04 NOTE — Progress Notes (Signed)
PHARMACY -  BRIEF ANTIBIOTIC NOTE   Pharmacy has received consult(s) for Cefepime and Vancomycin from an ED provider.  The patient's profile has been reviewed for ht/wt/allergies/indication/available labs.    One time order(s) placed for Vancomycin 1750mg  and Cefepime 2g  Further antibiotics/pharmacy consults should be ordered by admitting physician if indicated.                       Thank you, , PharmD, BCPS Clinical Pharmacist 06/04/2020 7:16 PM

## 2020-06-04 NOTE — ED Notes (Signed)
Pt brother [name] [phone]

## 2020-06-04 NOTE — ED Provider Notes (Signed)
[facility] Emergency Department Provider Note ____________________________________________   Event Date/Time   First MD Initiated Contact with Patient 06/04/20 1729     (approximate)  I have reviewed the triage vital signs and the nursing notes.   HISTORY  Chief Complaint Altered Mental Status  HPI Wyatt Espejo Montez Hageman. is a 60 y.o. male with history of hypertension, PTSD, diabetes, depression GERD, anxiety presents to the emergency department for treatment and evaluation of altered mental status x 3 days. No further information available.     Past Medical History:  Diagnosis Date  . Anxiety   . DM (diabetes mellitus) (HCC)   . HTN (hypertension)   . Transverse myelitis [facility])     Patient Active Problem List   Diagnosis Date Noted  . Urinary retention   . Acute metabolic encephalopathy 06/04/2020  . Sepsis (HCC) 06/04/2020  . CAP (community acquired pneumonia) 06/04/2020  . Neurogenic bladder 06/04/2020  . Chronic, continuous use of opioids 06/04/2020  . Chronic low back pain 06/04/2020  . Type 2 diabetes mellitus without complication (HCC) 06/04/2020  . Migraines 06/04/2020  . Recurrent UTI 06/04/2020  . History of colon polyps 06/04/2020   Prior to Admission medications   Medication Sig Start Date End Date Taking? Authorizing Provider  Alogliptin Benzoate 12.5 MG TABS Take 12.5 mg by mouth daily.   Yes [provider]  aspirin 81 MG chewable tablet Chew 81 mg by mouth daily.   Yes [provider]  cyclobenzaprine (FLEXERIL) 10 MG tablet Take 10 mg by mouth 3 (three) times daily as needed for muscle spasms.   Yes [provider]  Docusate Sodium (DSS) 100 MG CAPS Take 200 mg by mouth 2 (two) times daily.   Yes [provider]  DULoxetine (CYMBALTA) 60 MG capsule Take 120 mg by mouth daily.   Yes [provider]  gabapentin (NEURONTIN) 600 MG tablet Take 1,200 mg by mouth 3 (three) times daily.    Yes [provider]  guaiFENesin (MUCINEX) 600 MG 12 hr tablet Take 600 mg by mouth 2 (two) times daily.   Yes [provider]  hydrOXYzine (ATARAX/VISTARIL) 50 MG tablet Take 50 mg by mouth every 6 (six) hours as needed for anxiety.   Yes [provider]  ibuprofen (ADVIL) 800 MG tablet Take 800 mg by mouth every 8 (eight) hours as needed for mild pain.   Yes [provider]  latanoprost (XALATAN) 0.005 % ophthalmic solution Place 1 drop into both eyes at bedtime.   Yes [provider]  lisinopril (ZESTRIL) 40 MG tablet Take 20 mg by mouth 2 (two) times daily.   Yes [provider]  loratadine (CLARITIN) 10 MG tablet Take 10 mg by mouth daily.   Yes [provider]  metFORMIN (GLUCOPHAGE) 1000 MG tablet Take 1,000 mg by mouth 2 (two) times daily.   Yes [provider]  metoprolol (TOPROL-XL) 200 MG 24 hr tablet Take 200 mg by mouth daily.   Yes [provider]  morphine (MS CONTIN) 15 MG 12 hr tablet Take 15 mg by mouth 2 (two) times daily.   Yes [provider]  omeprazole (PRILOSEC) 20 MG capsule Take 20 mg by mouth 2 (two) times daily.   Yes [provider]  oxyCODONE-acetaminophen (PERCOCET/ROXICET) 5-325 MG tablet Take 1 tablet by mouth 2 (two) times daily as needed for breakthrough pain.   Yes [provider]  prazosin (MINIPRESS) 1 MG capsule Take 1 mg by mouth  2 (two) times daily as needed (PTSD symptoms).   Yes [provider]  prazosin (MINIPRESS) 2 MG capsule Take 4 mg by mouth at bedtime.   Yes [provider]  promethazine (PHENERGAN) 25 MG tablet Take 25 mg by mouth 3 (three) times daily as needed for nausea.   Yes [provider]  sildenafil (VIAGRA) 100 MG tablet Take 50 mg by mouth daily as needed for erectile dysfunction.   Yes [provider]  simvastatin (ZOCOR) 80 MG tablet Take 80 mg by mouth every evening.   Yes [provider]  traZODone (DESYREL) 150 MG tablet Take 150 mg by mouth at bedtime.   Yes [provider]    Allergies Patient has no known allergies.  Family History  Problem Relation Age of Onset  . CAD Mother   . Diabetes Mother   . CAD Father   . Diabetes Father   . CAD Brother   . Diabetes Brother     Social History Social History   Tobacco Use  . Smoking status: Current Every Day Smoker  . Smokeless tobacco: Never Used  Substance Use Topics  . Alcohol use: Never    Review of Systems  Constitutional: No fever/chills Eyes: No visual changes. ENT: No sore throat. Cardiovascular: Denies chest pain. Respiratory: Denies shortness of breath. Gastrointestinal: No abdominal pain.  No nausea, no vomiting.  No diarrhea.  No constipation. Genitourinary: Negative for dysuria. Musculoskeletal: Negative for back pain. Skin: Negative for rash. Neurological: Negative for headaches, focal weakness or numbness. ____________________________________________   PHYSICAL EXAM:  VITAL SIGNS: ED Triage Vitals  Enc Vitals Group     BP 06/04/20 1716 (!) 167/67     Pulse Rate 06/04/20 1716 99     Resp 06/04/20 1716 18     Temp 06/04/20 1717 99.3 F (37.4 C)     Temp Source 06/04/20 1717 Oral     SpO2 06/04/20 1716 96 %     Weight 06/04/20 1717 200 lb (90.7 kg)     Height 06/04/20 1717 5\' 6"  (1.676 m)     Head Circumference --      Peak Flow --      Pain Score --      Pain Loc --      Pain Edu? --      Excl. in GC? --     Constitutional: Alert and oriented. Well appearing and in no acute distress. Eyes: Conjunctivae are normal. PERRL. EOMI. Head: Atraumatic. Nose: No congestion/rhinnorhea. Mouth/Throat: Mucous membranes are moist.  Oropharynx non-erythematous. Neck: No stridor.   Hematological/Lymphatic/Immunilogical: No cervical lymphadenopathy. Cardiovascular: Normal rate, regular rhythm. Grossly normal heart sounds.  Good peripheral circulation. Respiratory: Normal  respiratory effort.  No retractions. Lungs CTAB. Gastrointestinal: Soft and nontender. No distention. No abdominal bruits. No CVA tenderness. Genitourinary:  Musculoskeletal: No lower extremity tenderness nor edema.  No joint effusions. Neurologic:  Normal speech and language. No gross focal neurologic deficits are appreciated. No gait instability. Skin:  Skin is warm, dry and intact. No rash noted. Psychiatric: Mood and affect are normal. Speech and behavior are normal.  ____________________________________________   LABS (all labs ordered are listed, but only abnormal results are displayed)  Labs Reviewed  COMPREHENSIVE METABOLIC PANEL - Abnormal; Notable for the following components:      Result Value   Sodium 130 (*)    Potassium 3.2 (*)    Chloride 95 (*)    CO2 21 (*)    Glucose, Bld 249 (*)  Calcium 8.8 (*)    Albumin 3.4 (*)    AST 14 (*)    All other components within normal limits  CBC - Abnormal; Notable for the following components:   WBC 13.6 (*)    HCT 38.9 (*)    All other components within normal limits  URINALYSIS, COMPLETE (UACMP) WITH MICROSCOPIC - Abnormal; Notable for the following components:   Color, Urine YELLOW (*)    APPearance HAZY (*)    Glucose, UA 150 (*)    Hgb urine dipstick MODERATE (*)    Ketones, ur 80 (*)    Protein, ur >=300 (*)    Bacteria, UA RARE (*)    All other components within normal limits  URINE DRUG SCREEN, QUALITATIVE ([facility] ONLY) - Abnormal; Notable for the following components:   Tricyclic, Ur Screen POSITIVE (*)    Opiate, Ur Screen POSITIVE (*)    All other components within normal limits  LACTIC ACID, PLASMA - Abnormal; Notable for the following components:   Lactic Acid, Venous 3.1 (*)    All other components within normal limits  CORTISOL-AM, BLOOD - Abnormal; Notable for the following components:   Cortisol - AM 67.8 (*)    All other components within normal limits  CBC - Abnormal; Notable for the following  components:   WBC 12.3 (*)    Platelets 132 (*)    All other components within normal limits  GLUCOSE, CAPILLARY - Abnormal; Notable for the following components:   Glucose-Capillary 228 (*)    All other components within normal limits  BASIC METABOLIC PANEL - Abnormal; Notable for the following components:   Sodium 133 (*)    Potassium 3.2 (*)    CO2 18 (*)    Glucose, Bld 240 (*)    Calcium 8.3 (*)    All other components within normal limits  GLUCOSE, CAPILLARY - Abnormal; Notable for the following components:   Glucose-Capillary 218 (*)    All other components within normal limits  GLUCOSE, CAPILLARY - Abnormal; Notable for the following components:   Glucose-Capillary 211 (*)    All other components within normal limits  GLUCOSE, CAPILLARY - Abnormal; Notable for the following components:   Glucose-Capillary 151 (*)    All other components within normal limits  CULTURE, BLOOD (SINGLE)  RESP PANEL BY RT-PCR (FLU A&B, COVID) ARPGX2  CULTURE, BLOOD (SINGLE)  EXPECTORATED SPUTUM ASSESSMENT W GRAM STAIN, RFLX TO RESP C  ETHANOL  AMMONIA  LACTIC ACID, PLASMA  HIV ANTIBODY (ROUTINE TESTING W REFLEX)  PROCALCITONIN  LACTIC ACID, PLASMA  LACTIC ACID, PLASMA  PROCALCITONIN  PROTIME-INR  HEMOGLOBIN A1C   ____________________________________________  EKG  ED ECG REPORT I, Mehlani Blankenburg, FNP-BC personally viewed and interpreted this ECG.   Date: 06/04/2020  EKG Time: 1718  Rate: 91  Rhythm: normal sinus rhythm  Axis: normal  Intervals:none  ST&T Change: no ST elevation  ____________________________________________  RADIOLOGY  ED MD interpretation:    Left basilar pneumonia on chest x-ray.  I, Kem Boroughsari Sanyah Molnar, personally viewed and evaluated these images (plain radiographs) as part of my medical decision making, as well as reviewing the written report by the radiologist.  Official radiology report(s): DG Chest 1 View  Result Date: 06/04/2020 CLINICAL DATA:   Altered mental status EXAM: CHEST  1 VIEW COMPARISON:  08/07/2008 FINDINGS: Cardiac shadow is enlarged. Aortic calcifications are noted. Diffuse left basilar airspace opacity is noted consistent with acute pneumonia. No sizable effusion is seen. No bony abnormality is noted. IMPRESSION:  Left basilar pneumonia. Electronically Signed   By: Alcide Clever M.D.   On: 06/04/2020 18:27   CT CHEST ABDOMEN PELVIS WO CONTRAST  Result Date: 06/04/2020 CLINICAL DATA:  Altered mental status febrile cough EXAM: CT CHEST, ABDOMEN AND PELVIS WITHOUT CONTRAST TECHNIQUE: Multidetector CT imaging of the chest, abdomen and pelvis was performed following the standard protocol without IV contrast. COMPARISON:  Chest x-ray 06/04/2020 FINDINGS: CT CHEST FINDINGS Cardiovascular: Limited evaluation without intravenous contrast. Mild aortic atherosclerosis without aneurysmal dilatation. Coronary vascular calcification. Mild cardiomegaly. Trace pericardial effusion Mediastinum/Nodes: Midline trachea. No thyroid mass. Multiple subcentimeter mediastinal lymph nodes. Esophagus within normal limits Lungs/Pleura: No pleural effusion or pneumothorax. Consolidation with ground-glass density in the lingula, posterior left upper lobe and superior segment of left lower lobe. Multiple small nodules or nodular foci of airspace disease in the left upper lobe likely infectious or inflammatory. Musculoskeletal: Sternum is intact.  No acute osseous abnormality. CT ABDOMEN PELVIS FINDINGS Hepatobiliary: No focal liver abnormality is seen. No gallstones, gallbladder wall thickening, or biliary dilatation. Pancreas: Unremarkable. No pancreatic ductal dilatation or surrounding inflammatory changes. Spleen: Normal in size without focal abnormality. Adrenals/Urinary Tract: Adrenal glands are normal. Nonspecific perinephric fat stranding. No hydronephrosis. The bladder is unremarkable Stomach/Bowel: Stomach is within normal limits. Appendix appears normal. No  evidence of bowel wall thickening, distention, or inflammatory changes. Vascular/Lymphatic: Mild aortic atherosclerosis without aneurysm. No suspicious nodes. Reproductive: Prostate is unremarkable. Other: Negative for free air or free fluid. Musculoskeletal: Bilateral hip replacements with artifact. No acute osseous abnormality. IMPRESSION: 1. Dense consolidation with surrounded ground-glass density in the left upper and lower lobes consistent with pneumonia. Multiple small nodules or nodular foci of airspace disease in the left upper lobe likely infectious or inflammatory. Imaging follow-up to resolution is recommended. 2. No CT evidence for acute intra-abdominal or intrapelvic abnormality. There is nonspecific perinephric fat stranding but no hydronephrosis. 3. Cardiomegaly with trace pericardial effusion Electronically Signed   By: Jasmine Pang M.D.   On: 06/04/2020 19:33    ____________________________________________   PROCEDURES  Procedure(s) performed (including Critical Care):  Procedures  ____________________________________________   INITIAL IMPRESSION / ASSESSMENT AND PLAN     60 year old male presenting to the emergency department for treatment and evaluation of altered mental status.  See HPI for further details.  Plan will be to do a work-up for altered mental status and include COVID-19 testing.  DIFFERENTIAL DIAGNOSIS  Differential diagnosis includes, but is not limited to, alcohol, illicit or prescription medications, or other toxic ingestion; intracranial pathology such as stroke or intracerebral hemorrhage; fever or infectious causes including sepsis; hypoxemia and/or hypercarbia; uremia; trauma; endocrine related disorders such as diabetes, hypoglycemia, and thyroid-related diseases; hypertensive encephalopathy; etc.  ED COURSE  Patient typically goes to the [place] for his health care.  No outside records are available at this time.  After speaking with the patient's  brother who he also lives with, patient has not been feeling well for the past several days.  He had some nausea, vomiting, cough, body aches and felt that he was shaking inside.  No relief with Phenergan.  Yesterday he started seeming confused but it worsened significantly today.  He says he was not making much sense today and was trying to drink powder for a colonoscopy prep instead of the Gatorade that was beside of it.  Brother states that he has never seen the patient in this state of mind.  He denies any recent falls or known injuries.  Patient does meet sepsis criteria  based on white count and heart rate.  There is a small basilar pneumonia on x-ray but question if this would actually cause the degree of altered mental status.  Unsure if this is actually the source and will order antibiotics for unidentified source.  He will need to be admitted but is not stable for transport to the [place].  He is also not capable of agreeing to be transferred.  CT of the chest, abdomen, and pelvis shows groundglass appearing densities in the left upper and lower lung fields.  COVID-19 test is negative.  Plan at this point will be to get him admitted. Care transitioned to Dr. Fuller Plan who will discuss with the hospitalist service.      ___________________________________________   FINAL CLINICAL IMPRESSION(S) / ED DIAGNOSES  Final diagnoses:  Sepsis (HCC)  Community acquired pneumonia, unspecified laterality  Altered mental status, unspecified altered mental status type     ED Discharge Orders    None       Wyatt Shehan Montez Hageman. was evaluated in Emergency Department on 06/05/2020 for the symptoms described in the history of present illness. He was evaluated in the context of the global COVID-19 pandemic, which necessitated consideration that the patient might be at risk for infection with the SARS-CoV-2 virus that causes COVID-19. Institutional protocols and algorithms that pertain to the evaluation of  patients at risk for COVID-19 are in a state of rapid change based on information released by regulatory bodies including the CDC and federal and state organizations. These policies and algorithms were followed during the patient's care in the ED.   Note:  This document was prepared using Dragon voice recognition software and may include unintentional dictation errors.   Chinita Pester, FNP 06/05/20 1813    Concha Se, MD 06/05/20 [phone]

## 2020-06-04 NOTE — ED Notes (Signed)
Pt up and wandering around room, RN unable to get to room in time with bed alarm going off. Charge RN informed and sitter to come for safety

## 2020-06-05 ENCOUNTER — Other Ambulatory Visit: Payer: Self-pay

## 2020-06-05 DIAGNOSIS — R338 Other retention of urine: Secondary | ICD-10-CM

## 2020-06-05 DIAGNOSIS — R6520 Severe sepsis without septic shock: Secondary | ICD-10-CM

## 2020-06-05 DIAGNOSIS — G8929 Other chronic pain: Secondary | ICD-10-CM

## 2020-06-05 DIAGNOSIS — A419 Sepsis, unspecified organism: Secondary | ICD-10-CM | POA: Diagnosis not present

## 2020-06-05 DIAGNOSIS — R339 Retention of urine, unspecified: Secondary | ICD-10-CM

## 2020-06-05 DIAGNOSIS — J189 Pneumonia, unspecified organism: Secondary | ICD-10-CM | POA: Diagnosis not present

## 2020-06-05 DIAGNOSIS — M545 Low back pain, unspecified: Secondary | ICD-10-CM

## 2020-06-05 DIAGNOSIS — N319 Neuromuscular dysfunction of bladder, unspecified: Secondary | ICD-10-CM

## 2020-06-05 LAB — GLUCOSE, CAPILLARY
Glucose-Capillary: 228 mg/dL — ABNORMAL HIGH (ref 70–99)
Glucose-Capillary: 218 mg/dL — ABNORMAL HIGH (ref 70–99)
Glucose-Capillary: 211 mg/dL — ABNORMAL HIGH (ref 70–99)
Glucose-Capillary: 151 mg/dL — ABNORMAL HIGH (ref 70–99)
Glucose-Capillary: 147 mg/dL — ABNORMAL HIGH (ref 70–99)

## 2020-06-05 LAB — BASIC METABOLIC PANEL
Anion gap: 15 (ref 5–15)
BUN: 21 mg/dL (ref 8–23)
CO2: 18 mmol/L — ABNORMAL LOW (ref 22–32)
Calcium: 8.3 mg/dL — ABNORMAL LOW (ref 8.9–10.3)
Chloride: 100 mmol/L (ref 98–111)
Creatinine, Ser: 1.06 mg/dL (ref 0.61–1.24)
GFR, Estimated: >60 mL/min (ref >60)
Glucose, Bld: 240 mg/dL — ABNORMAL HIGH (ref 70–99)
Potassium: 3.2 mmol/L — ABNORMAL LOW (ref 3.5–5.1)
Sodium: 133 mmol/L — ABNORMAL LOW (ref 135–145)

## 2020-06-05 LAB — CBC
HCT: 43.1 % (ref 39.0–52.0)
Hemoglobin: 14.6 g/dL (ref 13.0–17.0)
MCH: 29.2 pg (ref 26.0–34.0)
MCHC: 33.9 g/dL (ref 30.0–36.0)
MCV: 86.2 fL (ref 80.0–100.0)
Platelets: 132 10*3/uL — ABNORMAL LOW (ref 150–400)
RBC: 5 MIL/uL (ref 4.22–5.81)
RDW: 14.1 % (ref 11.5–15.5)
WBC: 12.3 10*3/uL — ABNORMAL HIGH (ref 4.0–10.5)
nRBC: 0 % (ref 0.0–0.2)

## 2020-06-05 LAB — HIV ANTIBODY (ROUTINE TESTING W REFLEX): HIV Screen 4th Generation wRfx: NONREACTIVE

## 2020-06-05 LAB — LACTIC ACID, PLASMA
Lactic Acid, Venous: 1.9 mmol/L (ref 0.5–1.9)
Lactic Acid, Venous: 1.5 mmol/L (ref 0.5–1.9)

## 2020-06-05 LAB — PROTIME-INR
INR: 1.2 (ref 0.8–1.2)
Prothrombin Time: 14.9 seconds (ref 11.4–15.2)

## 2020-06-05 LAB — CORTISOL-AM, BLOOD: Cortisol - AM: 67.8 ug/dL — ABNORMAL HIGH (ref 6.7–22.6)

## 2020-06-05 LAB — PROCALCITONIN: Procalcitonin: 4.33 ng/mL

## 2020-06-05 MED ORDER — SODIUM CHLORIDE 0.9 % IV SOLN: INTRAVENOUS | Status: DC

## 2020-06-05 MED ORDER — LABETALOL HCL 5 MG/ML IV SOLN
10.0000 mg | INTRAVENOUS | Status: DC | PRN
  Administered 2020-06-05 – 2020-06-15 (×14): 10 mg via INTRAVENOUS
  Filled 2020-06-05 (×15): qty 4

## 2020-06-05 MED ORDER — HYDRALAZINE HCL 20 MG/ML IJ SOLN
10.0000 mg | INTRAMUSCULAR | Status: DC | PRN
  Administered 2020-06-05 – 2020-06-06 (×2): 10 mg via INTRAVENOUS
  Filled 2020-06-05 (×2): qty 1

## 2020-06-05 MED ORDER — CHLORHEXIDINE GLUCONATE CLOTH 2 % EX PADS
6.0000 | MEDICATED_PAD | Freq: Every day | CUTANEOUS | Status: DC
  Administered 2020-06-05 – 2020-07-21 (×46): 6 via TOPICAL

## 2020-06-05 NOTE — Progress Notes (Signed)
Received call to 2C that pt needed coude catheter. Both myself and a second RN attempted coude catheter and were unsuccessful. Pt's RN notified.

## 2020-06-05 NOTE — Progress Notes (Signed)
BP is currently at 195/79, pulse 111, RR 37, axillary temp 99.4, O2 at 93%. ICU rapid response nurse notified of patient's status. On-call MD notified and requested to address elevated BP.

## 2020-06-05 NOTE — Progress Notes (Signed)
Inpatient Diabetes Program Recommendations  AACE/ADA: New Consensus Statement on Inpatient Glycemic Control (2015)  Target Ranges:  Prepandial:   less than 140 mg/dL      Peak postprandial:   less than 180 mg/dL (1-2 hours)      Critically ill patients:  140 - 180 mg/dL   Lab Results  Component Value Date   GLUCAP 211 (H) 06/05/2020    Review of Glycemic Control Results for Wyatt Ball, Wyatt Ball (MRN [id number]) as of 06/05/2020 14:05  Ref. Range 06/05/2020 04:01 06/05/2020 08:53 06/05/2020 11:21  Glucose-Capillary Latest Ref Range: 70 - 99 mg/dL 416 (H) 606 (H) 301 (H)   Diabetes history: DM 2 Outpatient Diabetes medications: None listed Current orders for Inpatient glycemic control:  Novolog 0-15 units Q4  Inpatient Diabetes Program Recommendations:    -  Consider starting Levemir 10 units  Thanks,  .Christena Deem RN, MSN, BC-ADM Inpatient Diabetes Coordinator Team Pager [phone] (8a-5p)

## 2020-06-05 NOTE — Progress Notes (Signed)
   06/04/20 2259  Assess: MEWS Score  Temp (!) 100.4 F (38 C)  BP (!) 198/74  Pulse Rate 92  Resp (!) 32  SpO2 94 %  O2 Device Room Air  Assess: MEWS Score  MEWS Temp 0  MEWS Systolic 0  MEWS Pulse 0  MEWS RR 2  MEWS LOC 0  MEWS Score 2  MEWS Score Color Yellow  Assess: if the MEWS score is Yellow or Red  Were vital signs taken at a resting state? Yes  Focused Assessment Change from prior assessment (see assessment flowsheet)  Early Detection of Sepsis Score *See Row Information* High  MEWS guidelines implemented *See Row Information* Yes  Treat  MEWS Interventions Escalated (See documentation below)  Take Vital Signs  Increase Vital Sign Frequency  Yellow: Q 2hr X 2 then Q 4hr X 2, if remains yellow, continue Q 4hrs  Escalate  MEWS: Escalate Yellow: discuss with charge nurse/RN and consider discussing with provider and RRT  Notify: Charge Nurse/RN  Name of Charge Nurse/RN Notified Lenise RN  Date Charge Nurse/RN Notified 06/04/20 (Also notified went to a MEWS 3 at 102 on 06/05/2020)  Time Charge Nurse/RN Notified 2338  Notify: Provider  Provider Name/Title Lindajo Royal  Date Provider Notified 06/04/20 (Also notified went to a MEWS 3 at 102 06/05/2020)  Time Provider Notified 2338  Notification Type Call  Notification Reason Change in status;Critical result;Other (Comment) (Lactic was called by lab 3.1 MD notified)  Provider response See new orders  Date of Provider Response 06/04/20  Time of Provider Response [phone]

## 2020-06-05 NOTE — Progress Notes (Signed)
   06/05/20 0057  Assess: MEWS Score  Temp 98 F (36.7 C)  BP (!) 169/88  Pulse Rate (!) 105  Resp (!) 30  SpO2 91 %  O2 Device Room Air  Assess: MEWS Score  MEWS Temp 0  MEWS Systolic 0  MEWS Pulse 1  MEWS RR 2  MEWS LOC 0  MEWS Score 3  MEWS Score Color Yellow  Assess: if the MEWS score is Yellow or Red  Were vital signs taken at a resting state? Yes  Focused Assessment Change from prior assessment (see assessment flowsheet)  Early Detection of Sepsis Score *See Row Information* High  MEWS guidelines implemented *See Row Information* Yes  Treat  Pain Scale 0-10  Pain Score 0  Escalate  MEWS: Escalate Yellow: discuss with charge nurse/RN and consider discussing with provider and RRT  Notify: Charge Nurse/RN  Name of Charge Nurse/RN Notified Lenise RN  Date Charge Nurse/RN Notified 06/05/20 (06/05/2020 at 102 MEWS 3)  Time Charge Nurse/RN Notified 0102  Notify: Provider  Provider Name/Title Lindajo Royal  Date Provider Notified 06/05/20 (06/05/2020 at 102 MEWS 3)  Time Provider Notified 0102  Notification Type Call  Notification Reason Change in status;Other (Comment) (MEWS 3)  Provider response Other (Comment) (Provider noted)  Date of Provider Response 06/05/20  Time of Provider Response [phone]  Document  Patient Outcome Not stable and remains on department (MD notified that MEWS is a 3 at 102am)

## 2020-06-05 NOTE — Sepsis Progress Note (Signed)
Notified provider of need to order repeat lactic acid. ° °

## 2020-06-05 NOTE — Progress Notes (Addendum)
[facility]  - [place] at [facility]   PATIENT NAME: Wyatt Ball    MR#:  [id number]  DATE OF BIRTH:  Apr 28, 1960  SUBJECTIVE:   Patient awake and alert. Appears spaced out. Although answers for questions appropriately. Having fever of 102.9. Little shaky with chills. No family at bedside. Patient drank a whole cup of iced tea given by me. Complains of headache. REVIEW OF SYSTEMS:   Review of Systems  Constitutional: Negative for chills, fever and weight loss.  HENT: Negative for ear discharge, ear pain and nosebleeds.   Eyes: Negative for blurred vision, pain and discharge.  Respiratory: Negative for sputum production, shortness of breath, wheezing and stridor.   Cardiovascular: Negative for chest pain, palpitations, orthopnea and PND.  Gastrointestinal: Negative for abdominal pain, diarrhea, nausea and vomiting.  Genitourinary: Negative for frequency and urgency.  Musculoskeletal: Negative for back pain and joint pain.  Neurological: Negative for sensory change, speech change, focal weakness and weakness.  Psychiatric/Behavioral: Negative for depression and hallucinations. The patient is not nervous/anxious.    Tolerating Diet: Tolerating PT: pending  DRUG ALLERGIES:  No Known Allergies  VITALS:  Blood pressure (!) 174/80, pulse 88, temperature 99.3 F (37.4 C), resp. rate (!) 34, height 5\' 6"  (1.676 m), weight 90.7 kg, SpO2 95 %.  PHYSICAL EXAMINATION:   Physical Exam  GENERAL:  60 y.o.-year-old patient lying in the bed with no acute distress.  LUNGS: Normal breath sounds bilaterally, no wheezing, rales, rhonchi. No use of accessory muscles of respiration.  CARDIOVASCULAR: S1, S2 normal. No murmurs, rubs, or gallops.  ABDOMEN: Soft, nontender, nondistended. Bowel sounds present. No organomegaly or mass.  EXTREMITIES: No cyanosis, clubbing or edema b/l.    NEUROLOGIC: limited exam due to patient not much able to participate. Able to move all  extremities well.  PSYCHIATRIC:  patient is alert  SKIN: No obvious rash, lesion, or ulcer.   LABORATORY PANEL:  CBC Recent Labs  Lab 06/05/20 0716  WBC 12.3*  HGB 14.6  HCT 43.1  PLT 132*    Chemistries  Recent Labs  Lab 06/04/20 1718 06/05/20 0838  NA 130* 133*  K 3.2* 3.2*  CL 95* 100  CO2 21* 18*  GLUCOSE 249* 240*  BUN 20 21  CREATININE 1.06 1.06  CALCIUM 8.8* 8.3*  AST 14*  --   ALT 11  --   ALKPHOS 61  --   BILITOT 1.0  --    Cardiac Enzymes No results for input(s): TROPONINI in the last 168 hours. RADIOLOGY:  DG Chest 1 View  Result Date: 06/04/2020 CLINICAL DATA:  Altered mental status EXAM: CHEST  1 VIEW COMPARISON:  08/07/2008 FINDINGS: Cardiac shadow is enlarged. Aortic calcifications are noted. Diffuse left basilar airspace opacity is noted consistent with acute pneumonia. No sizable effusion is seen. No bony abnormality is noted. IMPRESSION: Left basilar pneumonia. Electronically Signed   By: 10/07/2008 M.D.   On: 06/04/2020 18:27   CT Head Wo Contrast  Result Date: 06/04/2020 CLINICAL DATA:  60 year old male with altered mental status. EXAM: CT HEAD WITHOUT CONTRAST CT CERVICAL SPINE WITHOUT CONTRAST TECHNIQUE: Multidetector CT imaging of the head and cervical spine was performed following the standard protocol without intravenous contrast. Multiplanar CT image reconstructions of the cervical spine were also generated. COMPARISON:  Head CT dated 08/07/2008. FINDINGS: CT HEAD FINDINGS Brain: The ventricles and sulci appropriate size for patient's age. The gray-white matter discrimination is preserved. There is no acute intracranial hemorrhage. No mass effect or  midline shift. No extra-axial fluid collection. Vascular: No hyperdense vessel or unexpected calcification. Skull: Normal. Negative for fracture or focal lesion. Sinuses/Orbits: No acute finding. Other: None CT CERVICAL SPINE FINDINGS Alignment: No acute subluxation. There is straightening of normal  cervical lordosis which may be positional or due to muscle spasm. Skull base and vertebrae: No acute fracture. Osteopenia. Soft tissues and spinal canal: No prevertebral fluid or swelling. No visible canal hematoma. Disc levels: Degenerative changes with disc space narrowing and endplate irregularity. Upper chest: Negative. Other: Exophytic thyroid tissue versus a 15 mm exophytic thyroid or parathyroid nodule on the right. Recommend thyroid US (ref: J Am Coll Radiol. 2015 Feb;12(2): 143-50). IMPRESSION: 1. No acute intracranial pathology. 2. No acute/traumatic cervical spine pathology. 3. Possible 15 mm exophytic thyroid or parathyroid nodule on the right. Further evaluation with ultrasound on a nonemergent/outpatient basis is recommended. Electronically Signed   By: Elgie Collard M.D.   On: 06/04/2020 18:19   CT Cervical Spine Wo Contrast  Result Date: 06/04/2020 CLINICAL DATA:  60 year old male with altered mental status. EXAM: CT HEAD WITHOUT CONTRAST CT CERVICAL SPINE WITHOUT CONTRAST TECHNIQUE: Multidetector CT imaging of the head and cervical spine was performed following the standard protocol without intravenous contrast. Multiplanar CT image reconstructions of the cervical spine were also generated. COMPARISON:  Head CT dated 08/07/2008. FINDINGS: CT HEAD FINDINGS Brain: The ventricles and sulci appropriate size for patient's age. The gray-white matter discrimination is preserved. There is no acute intracranial hemorrhage. No mass effect or midline shift. No extra-axial fluid collection. Vascular: No hyperdense vessel or unexpected calcification. Skull: Normal. Negative for fracture or focal lesion. Sinuses/Orbits: No acute finding. Other: None CT CERVICAL SPINE FINDINGS Alignment: No acute subluxation. There is straightening of normal cervical lordosis which may be positional or due to muscle spasm. Skull base and vertebrae: No acute fracture. Osteopenia. Soft tissues and spinal canal: No  prevertebral fluid or swelling. No visible canal hematoma. Disc levels: Degenerative changes with disc space narrowing and endplate irregularity. Upper chest: Negative. Other: Exophytic thyroid tissue versus a 15 mm exophytic thyroid or parathyroid nodule on the right. Recommend thyroid US (ref: J Am Coll Radiol. 2015 Feb;12(2): 143-50). IMPRESSION: 1. No acute intracranial pathology. 2. No acute/traumatic cervical spine pathology. 3. Possible 15 mm exophytic thyroid or parathyroid nodule on the right. Further evaluation with ultrasound on a nonemergent/outpatient basis is recommended. Electronically Signed   By: Elgie Collard M.D.   On: 06/04/2020 18:19   CT CHEST ABDOMEN PELVIS WO CONTRAST  Result Date: 06/04/2020 CLINICAL DATA:  Altered mental status febrile cough EXAM: CT CHEST, ABDOMEN AND PELVIS WITHOUT CONTRAST TECHNIQUE: Multidetector CT imaging of the chest, abdomen and pelvis was performed following the standard protocol without IV contrast. COMPARISON:  Chest x-ray 06/04/2020 FINDINGS: CT CHEST FINDINGS Cardiovascular: Limited evaluation without intravenous contrast. Mild aortic atherosclerosis without aneurysmal dilatation. Coronary vascular calcification. Mild cardiomegaly. Trace pericardial effusion Mediastinum/Nodes: Midline trachea. No thyroid mass. Multiple subcentimeter mediastinal lymph nodes. Esophagus within normal limits Lungs/Pleura: No pleural effusion or pneumothorax. Consolidation with ground-glass density in the lingula, posterior left upper lobe and superior segment of left lower lobe. Multiple small nodules or nodular foci of airspace disease in the left upper lobe likely infectious or inflammatory. Musculoskeletal: Sternum is intact.  No acute osseous abnormality. CT ABDOMEN PELVIS FINDINGS Hepatobiliary: No focal liver abnormality is seen. No gallstones, gallbladder wall thickening, or biliary dilatation. Pancreas: Unremarkable. No pancreatic ductal dilatation or surrounding  inflammatory changes. Spleen: Normal in size without focal  abnormality. Adrenals/Urinary Tract: Adrenal glands are normal. Nonspecific perinephric fat stranding. No hydronephrosis. The bladder is unremarkable Stomach/Bowel: Stomach is within normal limits. Appendix appears normal. No evidence of bowel wall thickening, distention, or inflammatory changes. Vascular/Lymphatic: Mild aortic atherosclerosis without aneurysm. No suspicious nodes. Reproductive: Prostate is unremarkable. Other: Negative for free air or free fluid. Musculoskeletal: Bilateral hip replacements with artifact. No acute osseous abnormality. IMPRESSION: 1. Dense consolidation with surrounded ground-glass density in the left upper and lower lobes consistent with pneumonia. Multiple small nodules or nodular foci of airspace disease in the left upper lobe likely infectious or inflammatory. Imaging follow-up to resolution is recommended. 2. No CT evidence for acute intra-abdominal or intrapelvic abnormality. There is nonspecific perinephric fat stranding but no hydronephrosis. 3. Cardiomegaly with trace pericardial effusion Electronically Signed   By: Jasmine Pang M.D.   On: 06/04/2020 19:33   ASSESSMENT AND PLAN:  Gaspar Garbe Madkins Montez Hageman. is a 60 y.o. 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   Sever Sepsis secondary to community-acquired pneumonia POA    Acute metabolic encephalopathy - Sepsis criteria includes fever, tachycardia leukocytosis, lactic acidosis with left upper and lower lobe pneumonia on chest CT and acute mental status changes - COVID and flu test were negative - Received  Vanco, cefepime and Flagyl in the emergency room along with sepsis fluid bolus -recievedsepsis fluids - IV Rocephin and azithromycin - Follow blood cultures so far neg - Aspiration precautions, fall precautions -  speech therapy to see patient -- Pro calcitonin 0.45-- 4.33 -- lactic acid 3.1--- 1.5    Neurogenic bladder post transverse myelitis    History of frequent UTIs    History of transverse myelitis - Patient self catheterizes - We will place Foley catheter given patient's acute confusion and sepsis presentation - Urinalysis without pyuria -- consider neurology consultation if patient's mentation does not clear.    Chronic, continuous use of opioids   Chronic low back pain - Patient opioid dependent per patient's brother. Will resume pain meds once med rec completed - Pain control - Some concern for possible opioid withdrawal symptoms contributing to confusion    Type 2 diabetes mellitus without complication (HCC) - Sliding scale insulin coverage    History of colon polyps - Patient had a recent colonoscopy 4/27 at the [facility] with tubular adenoma  Nicotine dependence - Nicotine patch    DVT prophylaxis: Lovenox  Code Status: full code  Family Communication: Brother [name] over the phone Disposition Plan: Back to previous home environment Consults called: none        TOTAL TIME TAKING CARE OF THIS PATIENT: 35 minutes.  >50% time spent on counselling and coordination of care  Note: This dictation was prepared with Dragon dictation along with smaller phrase technology. Any transcriptional errors that result from this process are unintentional.  Enedina Finner M.D    [facility]   CC: Primary care physician; System, Provider Not InPatient ID: Toniann Ket., male   DOB: 1960-05-07, 60 y.o.   MRN: [id number]

## 2020-06-05 NOTE — Progress Notes (Signed)
Blood pressure (!) 189/77, pulse 91, temperature (!) 102.7 F (39.3 C), temperature source Axillary, resp. rate (!) 36, height 5\' 6"  (1.676 m), weight 90.7 kg, SpO2 95 %.  Pt was admitted last night, throughout the night he was a yellow mews of 2 and 3 for RR. Following up on VS q2hr, then q4hr at 1300 VS were obtained by tech that is 1:1 sitting for the pt in room 157. The VS made the pt a red mews, I immediately went to get the charge nurse , RN she said that policy was to call a rapid response for a red mews. Attending doctor was Lucy Antigua, MD, she said not to call a rapid response for his temp. since that was what made him a red mews. She told me to give him a 650mg  tylenol suppository and recheck VS in a hour to hour and half . Placed cool washcloths on forehead and under arms. Will recheck VS, pt is currently resting in bed with tech by bedside.

## 2020-06-05 NOTE — Consult Note (Signed)
Urology Consult Note   Requesting physician: Dr. Damita Dunnings  Reason for consultation: Foley catheter placement   Pinchas Klecker is a 60 y.o. male admitted with sepsis secondary to pneumonia.  History of neurogenic bladder secondary to transverse myelitis and he performs intermittent catheterization.  3-day history of altered mental status and he was unable to provide a history.  Attempts at Foley catheter placement were unsuccessful x2.  His care is provided by the [place].  Bladder scan with an estimated volume >500 mL  Exam: Patient alert but unable to provide history  GU: Phallus with subcoronal hypospadias and patulous meatus.  External genitalia were prepped and draped.  A 14 [place] Coloplast coud catheter was lubricated.  Some resistance was met in the anterior urethra which was overcome with gentle pressure.  The urethra was tight however the catheter was advanced into the bladder without difficulty.  Clear, yellow urine was obtained.  Recommendation:  Leave catheter indwelling until he is back at baseline mental status and able to perform intermittent catheterization   John Giovanni, MD

## 2020-06-06 ENCOUNTER — Inpatient Hospital Stay: Payer: No Typology Code available for payment source

## 2020-06-06 DIAGNOSIS — M545 Low back pain, unspecified: Secondary | ICD-10-CM | POA: Diagnosis not present

## 2020-06-06 DIAGNOSIS — G9341 Metabolic encephalopathy: Secondary | ICD-10-CM | POA: Diagnosis not present

## 2020-06-06 DIAGNOSIS — N319 Neuromuscular dysfunction of bladder, unspecified: Secondary | ICD-10-CM | POA: Diagnosis not present

## 2020-06-06 DIAGNOSIS — R4182 Altered mental status, unspecified: Secondary | ICD-10-CM | POA: Diagnosis not present

## 2020-06-06 DIAGNOSIS — J189 Pneumonia, unspecified organism: Secondary | ICD-10-CM | POA: Diagnosis not present

## 2020-06-06 DIAGNOSIS — A419 Sepsis, unspecified organism: Secondary | ICD-10-CM | POA: Diagnosis not present

## 2020-06-06 LAB — BLOOD CULTURE ID PANEL (REFLEXED) - BCID2

## 2020-06-06 LAB — CK: Total CK: 85 U/L (ref 49–397)

## 2020-06-06 LAB — CBC
HCT: 43.6 % (ref 39.0–52.0)
Hemoglobin: 15.1 g/dL (ref 13.0–17.0)
MCH: 29.5 pg (ref 26.0–34.0)
MCHC: 34.6 g/dL (ref 30.0–36.0)
MCV: 85.3 fL (ref 80.0–100.0)
Platelets: 191 10*3/uL (ref 150–400)
RBC: 5.11 MIL/uL (ref 4.22–5.81)
RDW: 14.7 % (ref 11.5–15.5)
WBC: 12.7 10*3/uL — ABNORMAL HIGH (ref 4.0–10.5)
nRBC: 0 % (ref 0.0–0.2)

## 2020-06-06 LAB — COMPREHENSIVE METABOLIC PANEL
ALT: 110 U/L — ABNORMAL HIGH (ref 0–44)
AST: 191 U/L — ABNORMAL HIGH (ref 15–41)
Albumin: 2.9 g/dL — ABNORMAL LOW (ref 3.5–5.0)
Alkaline Phosphatase: 68 U/L (ref 38–126)
Anion gap: 13 (ref 5–15)
BUN: 23 mg/dL (ref 8–23)
CO2: 21 mmol/L — ABNORMAL LOW (ref 22–32)
Calcium: 8.4 mg/dL — ABNORMAL LOW (ref 8.9–10.3)
Chloride: 106 mmol/L (ref 98–111)
Creatinine, Ser: 1.05 mg/dL (ref 0.61–1.24)
GFR, Estimated: >60 mL/min (ref >60)
Glucose, Bld: 240 mg/dL — ABNORMAL HIGH (ref 70–99)
Potassium: 3.1 mmol/L — ABNORMAL LOW (ref 3.5–5.1)
Sodium: 140 mmol/L (ref 135–145)
Total Bilirubin: 1.4 mg/dL — ABNORMAL HIGH (ref 0.3–1.2)
Total Protein: 6.7 g/dL (ref 6.5–8.1)

## 2020-06-06 LAB — HEMOGLOBIN A1C
Hgb A1c MFr Bld: 7.4 % — ABNORMAL HIGH (ref 4.8–5.6)
Mean Plasma Glucose: 166 mg/dL

## 2020-06-06 LAB — GLUCOSE, CAPILLARY
Glucose-Capillary: 152 mg/dL — ABNORMAL HIGH (ref 70–99)
Glucose-Capillary: 168 mg/dL — ABNORMAL HIGH (ref 70–99)
Glucose-Capillary: 171 mg/dL — ABNORMAL HIGH (ref 70–99)
Glucose-Capillary: 184 mg/dL — ABNORMAL HIGH (ref 70–99)
Glucose-Capillary: 197 mg/dL — ABNORMAL HIGH (ref 70–99)
Glucose-Capillary: 218 mg/dL — ABNORMAL HIGH (ref 70–99)
Glucose-Capillary: 241 mg/dL — ABNORMAL HIGH (ref 70–99)

## 2020-06-06 LAB — MAGNESIUM: Magnesium: 2.1 mg/dL (ref 1.7–2.4)

## 2020-06-06 LAB — MRSA PCR SCREENING: MRSA by PCR: NEGATIVE

## 2020-06-06 IMAGING — DX DG CHEST 1V PORT
1 series · 1 of 1 positions shown · non-contrast
Comparison: Radiograph and CT [DATE]

CLINICAL DATA: Shortness of breath.

EXAM:
PORTABLE CHEST 1 VIEW

[chest ap]
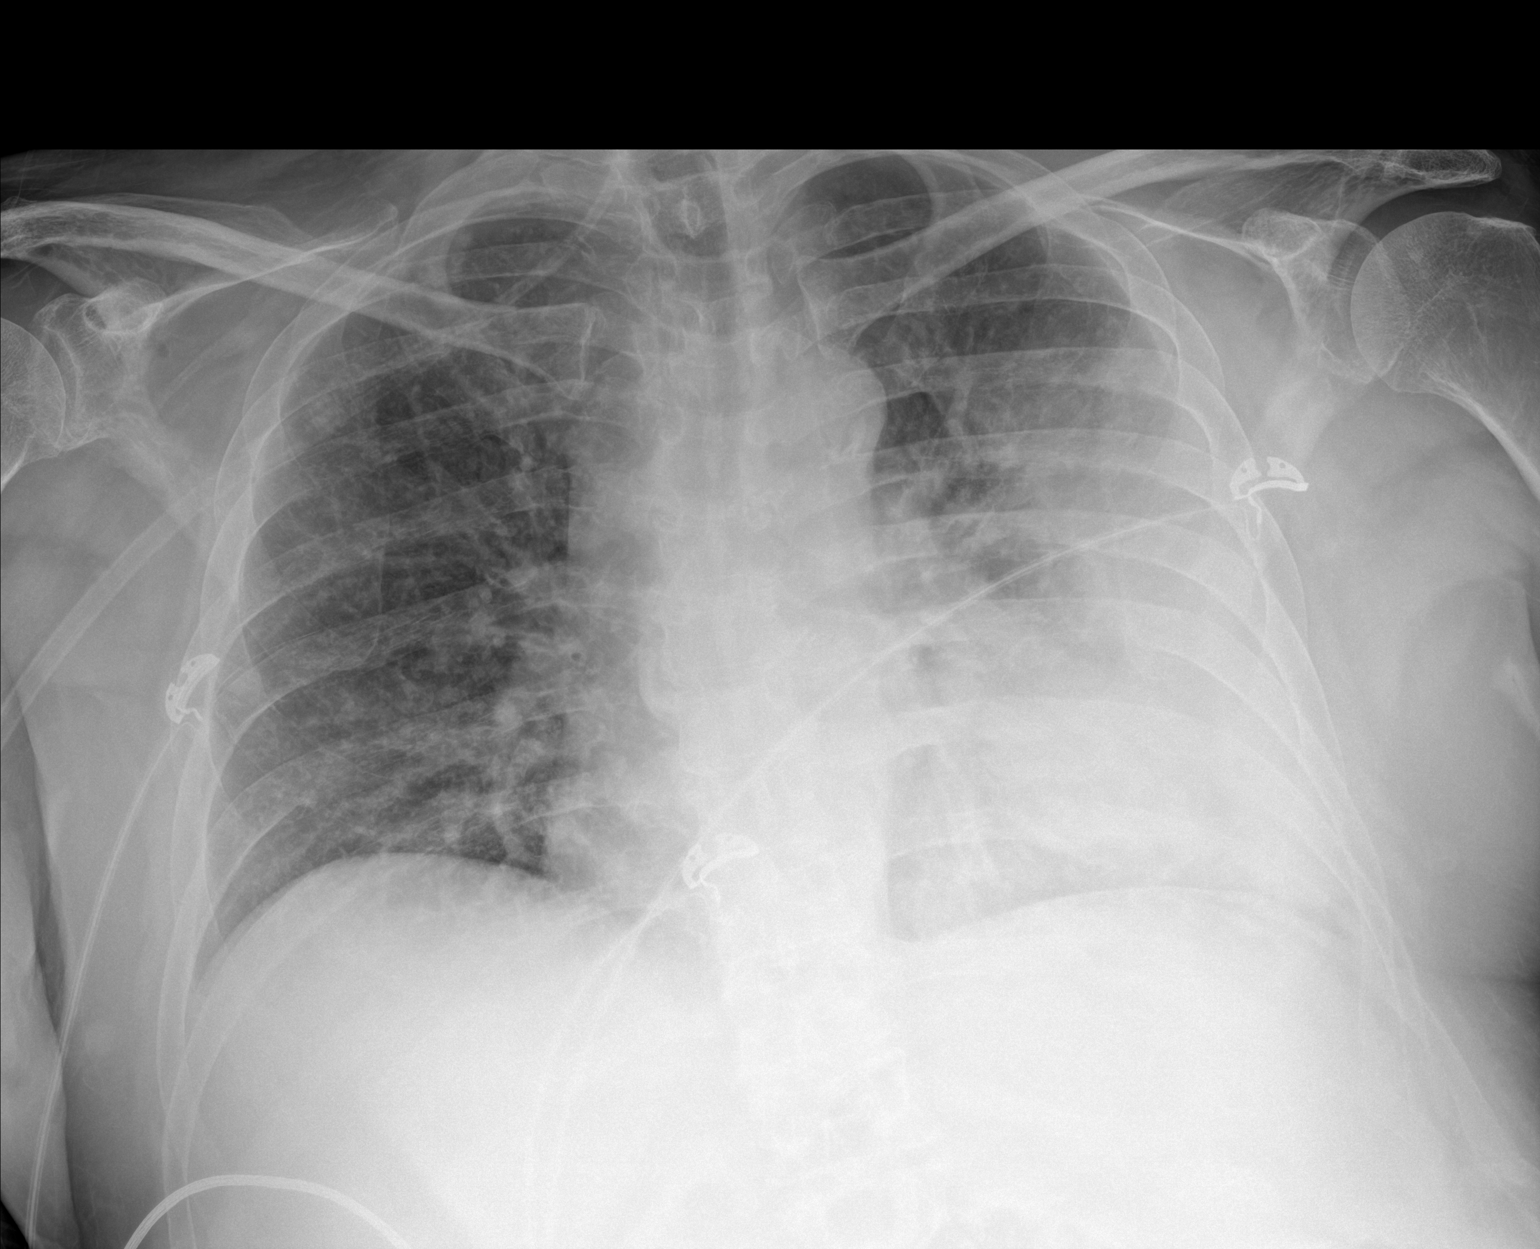

[1 of 1 positions shown; findings below may reference images not displayed]

FINDINGS: Progressive opacification of the lateral left hemithorax suspicious
for worsening pneumonia. No convincing pleural effusion. Mild
vascular congestion is developing. Upper normal heart size. No
pneumothorax.
IMPRESSION: 1. Progressive opacification of the lateral left hemithorax
suspicious for worsening pneumonia.
2. Developing vascular congestion.

## 2020-06-06 IMAGING — DX DG CHEST 1V PORT
1 series · 1 of 1 positions shown · non-contrast
Comparison: [DATE] at [DATE] p.m.

CLINICAL DATA: Intubated, previous abnormal chest x-ray

EXAM:
PORTABLE CHEST 1 VIEW

[chest ap]
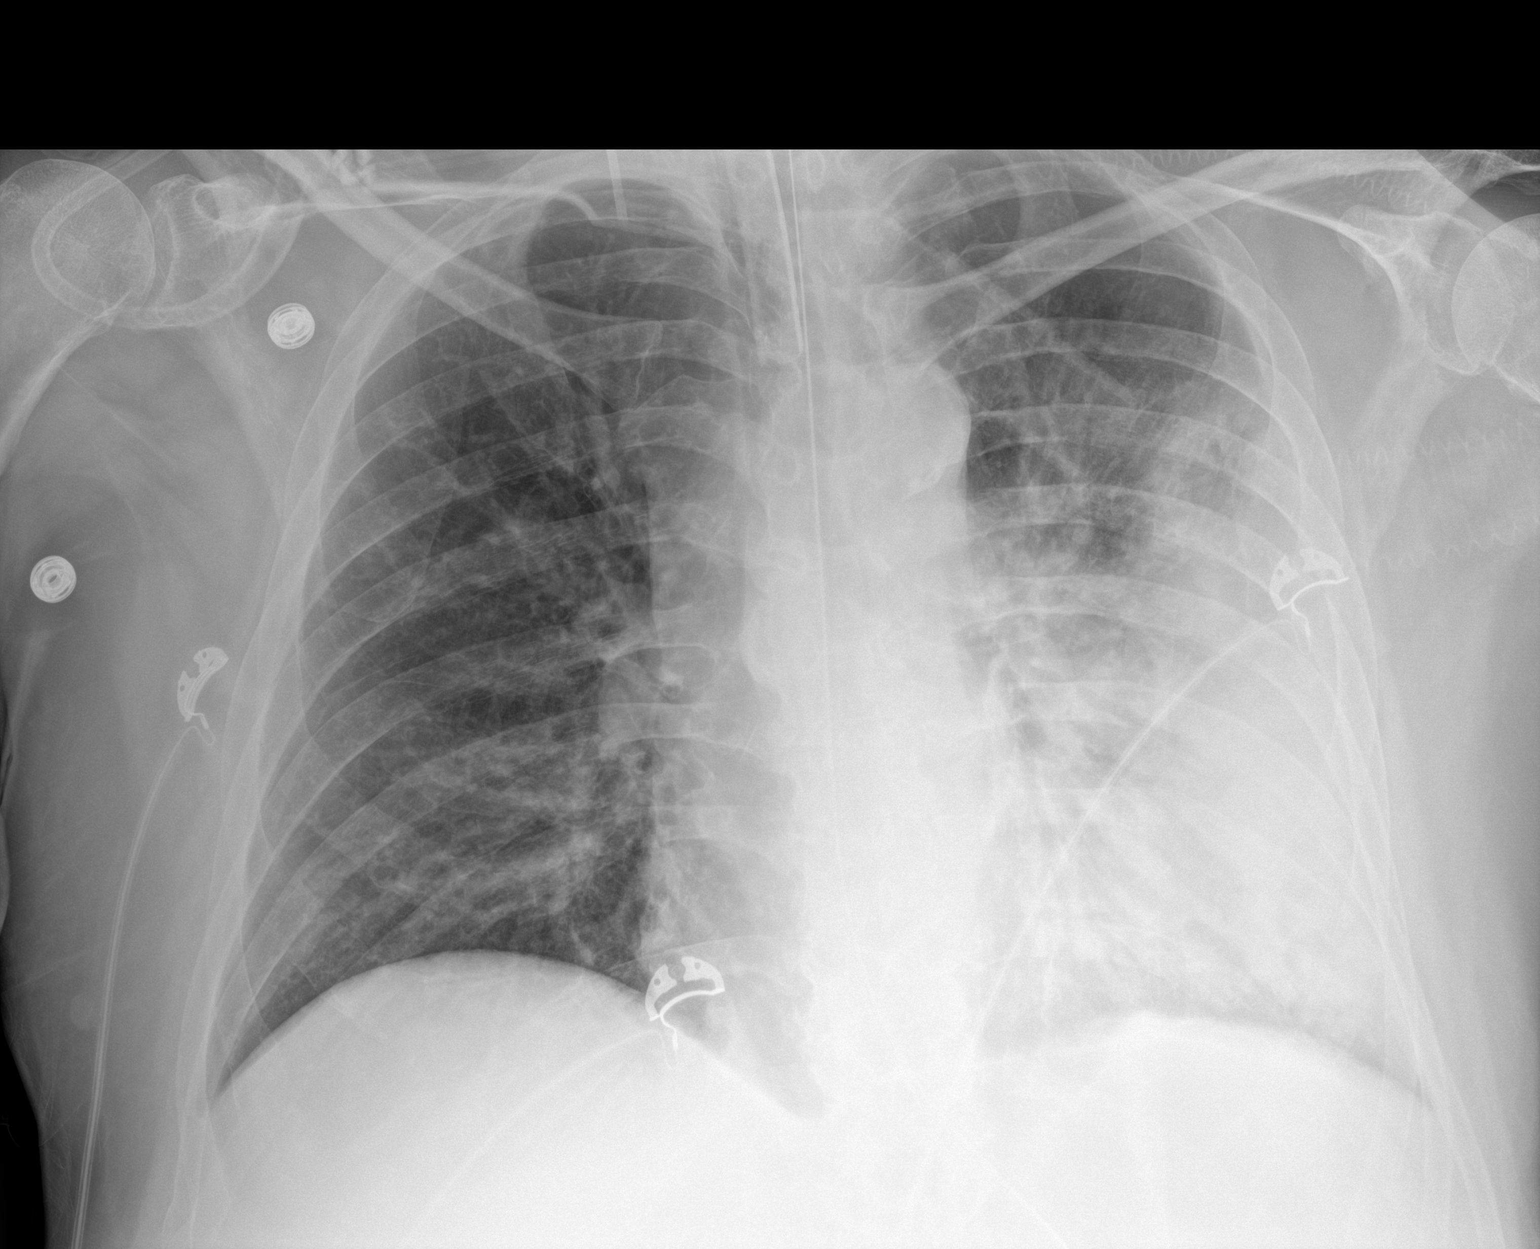

[1 of 1 positions shown; findings below may reference images not displayed]

FINDINGS: Single frontal view of the chest demonstrates interval placement of
an endotracheal tube, tip overlying tracheal air column at level of
thoracic inlet. Enteric catheter passes below diaphragm tip excluded
by collimation. Cardiac silhouette is stable. Continued dense
consolidation within the left lateral hemithorax. No effusion or
pneumothorax.
IMPRESSION: 1. Support devices as above.
2. Persistent left-sided pneumonia.

## 2020-06-06 IMAGING — DX DG ABDOMEN 1V
1 series · 1 of 1 positions shown · non-contrast
Comparison: [DATE]

CLINICAL DATA: Intubated, enteric catheter placement, pneumonia

EXAM:
ABDOMEN - 1 VIEW

[abdomen supine]
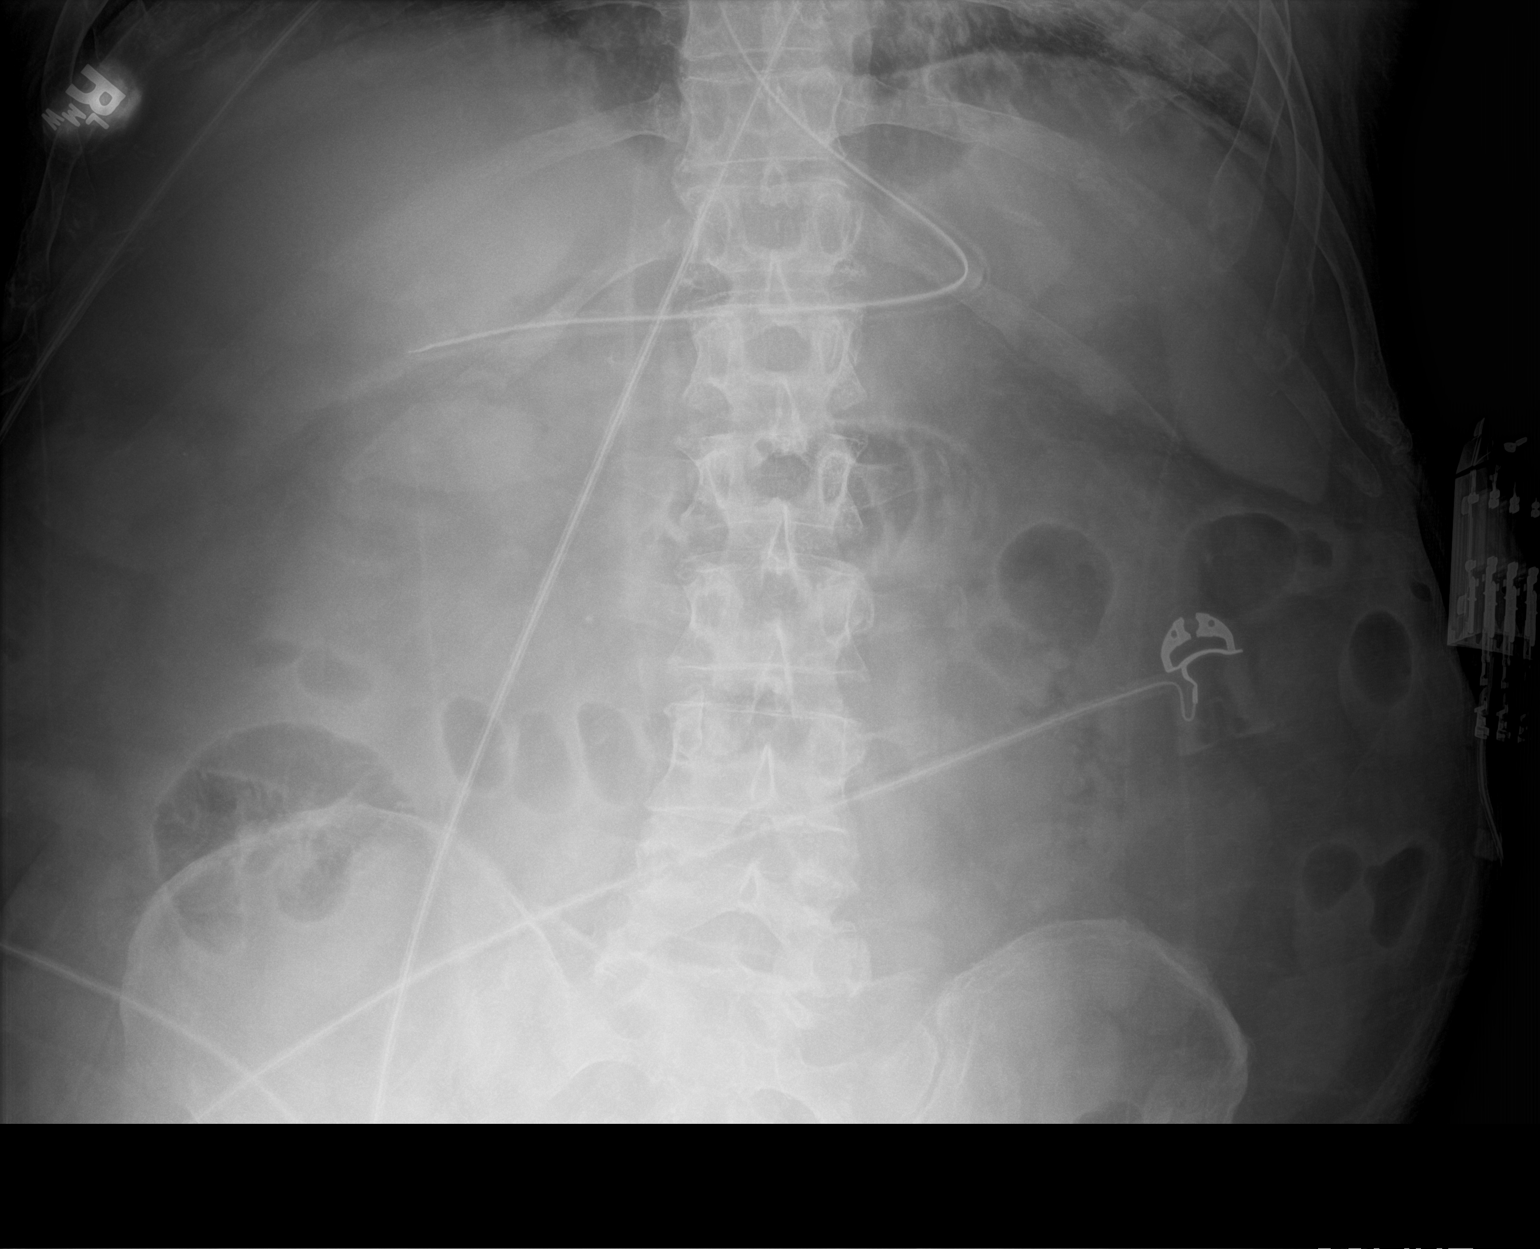

[1 of 1 positions shown; findings below may reference images not displayed]

FINDINGS: Frontal view of the abdomen and pelvis excludes the pubic symphysis
by collimation. Enteric catheter tip projects over the gastric
antrum. Bowel gas pattern is unremarkable. No acute bony
abnormalities.
IMPRESSION: 1. Enteric catheter tip projecting over the gastric antrum.

## 2020-06-06 MED ORDER — VECURONIUM BROMIDE 10 MG IV SOLR: 20.0000 mg | Freq: Once | INTRAVENOUS | Status: AC

## 2020-06-06 MED ORDER — VANCOMYCIN HCL 1500 MG/300ML IV SOLN
1500.0000 mg | Freq: Once | INTRAVENOUS | Status: DC
  Filled 2020-06-06: qty 300

## 2020-06-06 MED ORDER — FENTANYL 2500MCG IN NS 250ML (10MCG/ML) PREMIX INFUSION
0.0000 ug/h | INTRAVENOUS | Status: DC
  Administered 2020-06-06: 25 ug/h via INTRAVENOUS
  Administered 2020-06-07: 300 ug/h via INTRAVENOUS
  Administered 2020-06-07: 250 ug/h via INTRAVENOUS
  Administered 2020-06-08 – 2020-06-11 (×9): 300 ug/h via INTRAVENOUS
  Administered 2020-06-11: 325 ug/h via INTRAVENOUS
  Administered 2020-06-11: 200 ug/h via INTRAVENOUS
  Administered 2020-06-12 (×3): 350 ug/h via INTRAVENOUS
  Administered 2020-06-13: 125 ug/h via INTRAVENOUS
  Administered 2020-06-13: 225 ug/h via INTRAVENOUS
  Administered 2020-06-13: 325 ug/h via INTRAVENOUS
  Administered 2020-06-14 – 2020-06-15 (×3): 200 ug/h via INTRAVENOUS
  Administered 2020-06-16: 10 ug/h via INTRAVENOUS
  Administered 2020-06-19: 100 ug/h via INTRAVENOUS
  Filled 2020-06-06 (×26): qty 250

## 2020-06-06 MED ORDER — FENTANYL CITRATE (PF) 100 MCG/2ML IJ SOLN
INTRAMUSCULAR | Status: AC
  Administered 2020-06-06: 200 ug via INTRAVENOUS
  Filled 2020-06-06: qty 4

## 2020-06-06 MED ORDER — PROPOFOL 1000 MG/100ML IV EMUL
5.0000 ug/kg/min | INTRAVENOUS | Status: DC
Start: 2020-06-06 — End: 2020-06-10
  Administered 2020-06-06: 25 ug/kg/min via INTRAVENOUS
  Administered 2020-06-06: 10 ug/kg/min via INTRAVENOUS
  Administered 2020-06-07 (×4): 70 ug/kg/min via INTRAVENOUS
  Administered 2020-06-07: 35 ug/kg/min via INTRAVENOUS
  Administered 2020-06-07: 50 ug/kg/min via INTRAVENOUS
  Administered 2020-06-07: 40 ug/kg/min via INTRAVENOUS
  Administered 2020-06-07: 70 ug/kg/min via INTRAVENOUS
  Administered 2020-06-08: 50 ug/kg/min via INTRAVENOUS
  Administered 2020-06-08 (×2): 70 ug/kg/min via INTRAVENOUS
  Administered 2020-06-08: 60 ug/kg/min via INTRAVENOUS
  Administered 2020-06-08 (×5): 70 ug/kg/min via INTRAVENOUS
  Administered 2020-06-09: 50 ug/kg/min via INTRAVENOUS
  Administered 2020-06-09 (×2): 45 ug/kg/min via INTRAVENOUS
  Administered 2020-06-09 (×2): 50 ug/kg/min via INTRAVENOUS
  Administered 2020-06-09: 40 ug/kg/min via INTRAVENOUS
  Administered 2020-06-10: 50.035 ug/kg/min via INTRAVENOUS
  Administered 2020-06-10 (×2): 50 ug/kg/min via INTRAVENOUS
  Filled 2020-06-06 (×13): qty 100
  Filled 2020-06-06: qty 200
  Filled 2020-06-06 (×12): qty 100

## 2020-06-06 MED ORDER — HYDRALAZINE HCL 20 MG/ML IJ SOLN
10.0000 mg | Freq: Four times a day (QID) | INTRAMUSCULAR | Status: DC
  Administered 2020-06-06 – 2020-06-07 (×3): 10 mg via INTRAVENOUS
  Filled 2020-06-06 (×3): qty 1

## 2020-06-06 MED ORDER — VECURONIUM BROMIDE 10 MG IV SOLR
INTRAVENOUS | Status: AC
  Administered 2020-06-06: 20 mg via INTRAVENOUS
  Filled 2020-06-06: qty 20

## 2020-06-06 MED ORDER — MIDAZOLAM HCL 2 MG/2ML IJ SOLN
INTRAMUSCULAR | Status: AC
  Administered 2020-06-06: 4 mg via INTRAVENOUS
  Filled 2020-06-06: qty 4

## 2020-06-06 MED ORDER — LABETALOL HCL 5 MG/ML IV SOLN
10.0000 mg | INTRAVENOUS | Status: DC | PRN
  Administered 2020-06-06: 20 mg via INTRAVENOUS
  Filled 2020-06-06: qty 4

## 2020-06-06 MED ORDER — MIDAZOLAM HCL 2 MG/2ML IJ SOLN: 4.0000 mg | Freq: Once | INTRAMUSCULAR | Status: AC

## 2020-06-06 MED ORDER — MORPHINE SULFATE (PF) 2 MG/ML IV SOLN
0.5000 mg | Freq: Four times a day (QID) | INTRAVENOUS | Status: DC | PRN
  Administered 2020-06-06: 0.5 mg via INTRAVENOUS
  Filled 2020-06-06: qty 1

## 2020-06-06 MED ORDER — FENTANYL CITRATE (PF) 100 MCG/2ML IJ SOLN: 200.0000 ug | Freq: Once | INTRAMUSCULAR | Status: AC

## 2020-06-06 MED ORDER — DIPHENHYDRAMINE HCL 50 MG/ML IJ SOLN
25.0000 mg | Freq: Once | INTRAMUSCULAR | Status: AC
  Administered 2020-06-06: 25 mg via INTRAVENOUS
  Filled 2020-06-06: qty 1

## 2020-06-06 MED ORDER — POTASSIUM CHLORIDE CRYS ER 20 MEQ PO TBCR: 40.0000 meq | EXTENDED_RELEASE_TABLET | Freq: Once | ORAL | Status: DC

## 2020-06-06 NOTE — Progress Notes (Signed)
Patient status remains to decline RR's continues to be In the 20s-40s. HR ST 110s-120s. Belly breathing and wheezing/rhonci auscultated. Patient noted to have tremors along with strong cough still. Temperatures have been WNL however remains hot to touch with rash like blotches over body with cool lower left extremity. MD aware and gave orders for STAT chest xray. After results obtained gave orders to transfer patient. Report called by Clinical research associate and given to [employer]. Patient transferred to ICU room 12 with all belongings. Brother [name] made aware of patients declining in status and room change.

## 2020-06-06 NOTE — Progress Notes (Signed)
Writer put in speech eval r/t patient mouth being black in color with strong cough noted and unable to open mouth for meds but continues to ask for water/food. MD aware and speech placed orders for patient to be NPO from findings from speech eval. Patients brother [name] continues to be updated during  Shift of updated interventions pertaining plan of care.

## 2020-06-06 NOTE — Procedures (Signed)
Endotracheal Intubation: Patient required placement of an artificial airway secondary to Respiratory Failure  Consent: Emergent.   Hand washing performed prior to starting the procedure.   Medications administered for sedation prior to procedure:  Midazolam 4 mg IV,  Vecuronium 20 mg IV, Fentanyl 200 mcg IV.    A time out procedure was called and correct patient, name, & ID confirmed. Needed supplies and equipment were assembled and checked to include ETT, 10 ml syringe, Glidescope, Mac and Miller blades, suction, oxygen and bag mask valve, end tidal CO2 monitor.   Patient was positioned to align the mouth and pharynx to facilitate visualization of the glottis.   Heart rate, SpO2 and blood pressure was continuously monitored during the procedure. Pre-oxygenation was conducted prior to intubation and endotracheal tube was placed through the vocal cords into the trachea.     The artificial airway was placed under direct visualization via glidescope route using a 8.5  ETT on the first attempt.  ETT was secured at 24 cm mark.  Placement was confirmed by auscuitation of lungs with good breath sounds bilaterally and no stomach sounds.  Condensation was noted on endotracheal tube.   Pulse ox 98%.  CO2 detector in place with appropriate color change.   Complications: None .   Operator: Vong Garringer.   Chest radiograph ordered and pending.   Comments: OGT placed via glidescope.  Corrin Parker, M.D.  [facility]  Medical Director [place] Director [facility] Cardio-Pulmonary Department

## 2020-06-06 NOTE — Progress Notes (Signed)
Patient ID: Wyatt Garbe Picardi Montez Hageman., male   DOB: June 11, 1960, 60 y.o.   MRN: [id number] Patient more tachypneic and tachycardic Repeat CXR shows worsening Left sided Pneumonia Will transfer to ICU D/w Dr Belia Heman

## 2020-06-06 NOTE — Consult Note (Addendum)
NAME:  Wyatt Higham., MRN:  [id number], DOB:  Feb 04, 1960, LOS: 2 ADMISSION DATE:  06/04/2020 CONSULTATION DATE:  06/06/2020  REFERRING MD:  Allena Katz CHIEF COMPLAINT:  RESP FAILURE   History of Present Illness:  60 y.o. male with medical history significant for HTN, PTSD, diabetes, migraines, GERD, neurogenic bladder secondary to remote history of transverse myelitis,  who self catheterizes and has frequent UTIs as well as chronic back pain on chronic opiates who at baseline is independent - brought to the ER with a 3-day history of altered mental status.  + cough, shortness of breath and fever as well as vomiting and diarrhea and started becoming increasingly confused in the 24 hours prior to arrival.      Unable to get any meaningful history from patient.  ED course: On arrival, temperature 99.3, BP 167/67, pulse 99, respirations 18 with O2 sat 96% on room air.  Blood work significant for leukocytosis of 13,600 with lactic acid of 1.2.  Ammonia level normal at 15, EtOH less than 10.  Sodium 130, potassium 3.2, glucose 249 creatinine 1.06.  Urinalysis with 80 ketones and rare bacteria.  UDS positive for opiates and tricyclic's.  COVID and flu negative    Imaging:   CT chest abdomen and pelvis without contrast consistent with pneumonia.  Multiple nodules left upper lobe likely infectious or inflammatory with follow-up imaging recommended to document resolution.   Cardiomegaly with trace pericardial effusion  Patient was started on sepsis fluids and broad-spectrum antibiotics initially for sepsis of unknown source.     Significant Hospital Events: Including procedures, antibiotic start and stop dates in addition to other pertinent events   . 5/31 admitted for sepsis present on admission for Left lung pneumonia . 6/1 progressive confusion and hypoxia . 6/2 transferred to ICU, plan for emergent intubation      Micro Data:  COVID INF A/B NEG   Antimicrobials:    Antibiotics Given (last 72 hours)    Date/Time Action Medication Dose Rate   06/04/20 1945 New Bag/Given   metroNIDAZOLE (FLAGYL) IVPB 500 mg 500 mg 100 mL/hr   06/04/20 2023 New Bag/Given  [unable to give at schedualed time due to not having any on floor.]   ceFEPIme (MAXIPIME) 2 g in sodium chloride 0.9 % 100 mL IVPB 2 g 200 mL/hr   06/04/20 2057 New Bag/Given   vancomycin (VANCOREADY) IVPB 1750 mg/350 mL 1,750 mg 175 mL/hr   06/05/20 0012 New Bag/Given   azithromycin (ZITHROMAX) 500 mg in sodium chloride 0.9 % 250 mL IVPB 500 mg 250 mL/hr   06/05/20 0800 New Bag/Given   cefTRIAXone (ROCEPHIN) 2 g in sodium chloride 0.9 % 100 mL IVPB 2 g 200 mL/hr   06/05/20 2337 New Bag/Given   azithromycin (ZITHROMAX) 500 mg in sodium chloride 0.9 % 250 mL IVPB 500 mg 250 mL/hr   06/06/20 0454 New Bag/Given   cefTRIAXone (ROCEPHIN) 2 g in sodium chloride 0.9 % 100 mL IVPB 2 g 200 mL/hr          Objective   Blood pressure (!) 191/88, pulse (!) 107, temperature 98.7 F (37.1 C), temperature source Axillary, resp. rate (!) 25, height 5\' 6"  (1.676 m), weight 95.6 kg, SpO2 95 %.        Intake/Output Summary (Last 24 hours) at 06/06/2020 1756 Last data filed at 06/06/2020 1700 Gross per 24 hour  Intake 935.3 ml  Output 2200 ml  Net -1264.7 ml   Filed Weights   06/04/20 1717 06/06/20  0900  Weight: 90.7 kg 95.6 kg      REVIEW OF SYSTEMS  PATIENT IS UNABLE TO PROVIDE COMPLETE REVIEW OF SYSTEMS DUE TO SEVERE CRITICAL ILLNESS AND TOXIC METABOLIC ENCEPHALOPATHY   PHYSICAL EXAMINATION:  GENERAL:critically ill appearing, +resp distress HEAD: Normocephalic, atraumatic.  EYES: Pupils equal, round, reactive to light.  No scleral icterus.  MOUTH: Moist mucosal membrane. NECK: Supple.  PULMONARY: +rhonchi, +wheezing CARDIOVASCULAR: S1 and S2. Regular rate and rhythm. No murmurs, rubs, or gallops.  GASTROINTESTINAL: Soft, nontender, -distended. Positive bowel sounds.  MUSCULOSKELETAL: No  swelling, clubbing, or edema.  NEUROLOGIC: very agitated SKIN:intact,warm,dry   Labs/imaging that I havepersonally reviewed  (right click and "Reselect all SmartList Selections" daily)       ASSESSMENT AND PLAN SYNOPSIS  60 yo white make with acute and severe hypoxic respiratory failure with acute and severe toxic metabolic encephalopathy with acute COPD Exacerbation LUL/LL multifocal pneumonia   Severe ACUTE Hypoxic and Hypercapnic Respiratory Failure Plan for emergent intubation Plan for bronch Plan for BD therapy IV steroids    SEVERE COPD EXACERBATION -continue IV steroids as prescribed -continue NEB THERAPY as prescribed   CARDIAC ICU monitoring  NEUROLOGY Acute toxic metabolic encephalopathy due to hypoxia  SEVERE SEPSIS  -use vasopressors to keep MAP>65 as needed -follow ABG and LA -follow up cultures -emperic ABX  INFECTIOUS DISEASE -continue antibiotics as prescribed -follow up cultures -follow up ID consultation [facility] - ICU hypoglycemic\Hyperglycemia protocol -check FSBS per protocol   GI GI PROPHYLAXIS as indicated  NUTRITIONAL STATUS DIET-->TF's as tolerated Constipation protocol as indicated   ELECTROLYTES -follow labs as needed -replace as needed -pharmacy consultation and following     Best practice (right click and "Reselect all SmartList Selections" daily)  Diet:  NPO Pain/Anxiety/Delirium protocol (if indicated): Yes (RASS goal -2) VAP protocol (if indicated): Yes DVT prophylaxis: Subcutaneous Heparin GI prophylaxis: H2B Glucose control:  SSI Yes Central venous access:  N/A Arterial line:  N/A Foley:  N/A Mobility:  bed rest  PT consulted: N/A Code Status:  full code Disposition: ICU  Labs   CBC: Recent Labs  Lab 06/04/20 1718 06/05/20 0716  WBC 13.6* 12.3*  HGB 13.6 14.6  HCT 38.9* 43.1  MCV 85.9 86.2  PLT 165 132*    Basic Metabolic Panel: Recent Labs  Lab 06/04/20 1718  06/05/20 0838  NA 130* 133*  K 3.2* 3.2*  CL 95* 100  CO2 21* 18*  GLUCOSE 249* 240*  BUN 20 21  CREATININE 1.06 1.06  CALCIUM 8.8* 8.3*   GFR: Estimated Creatinine Clearance: 79.2 mL/min (by C-G formula based on SCr of 1.06 mg/dL). Recent Labs  Lab 06/04/20 1718 06/04/20 2148 06/05/20 0716 06/05/20 0729 06/05/20 0838  PROCALCITON  --  0.45  --   --  4.33  WBC 13.6*  --  12.3*  --   --   LATICACIDVEN 1.2 3.1*  --  1.9 1.5    Liver Function Tests: Recent Labs  Lab 06/04/20 1718  AST 14*  ALT 11  ALKPHOS 61  BILITOT 1.0  PROT 6.9  ALBUMIN 3.4*   No results for input(s): LIPASE, AMYLASE in the last 168 hours. Recent Labs  Lab 06/04/20 1718  AMMONIA 15    ABG No results found for: PHART, PCO2ART, PO2ART, HCO3, TCO2, ACIDBASEDEF, O2SAT   Coagulation Profile: Recent Labs  Lab 06/05/20 0838  INR 1.2    Cardiac Enzymes: Recent Labs  Lab 06/06/20 1619  CKTOTAL 85    HbA1C:  Hgb A1c MFr Bld  Date/Time Value Ref Range Status  06/04/2020 09:48 PM 7.4 (H) 4.8 - 5.6 % Final    Comment:    (NOTE)         Prediabetes: 5.7 - 6.4         Diabetes: >6.4         Glycemic control for adults with diabetes: <7.0     CBG: Recent Labs  Lab 06/06/20 0022 06/06/20 0313 06/06/20 1009 06/06/20 1147 06/06/20 1604  GLUCAP 171* 152* 168* 184* 218*     Past Medical History:  He,  has a past medical history of Anxiety, DM (diabetes mellitus) (HCC), HTN (hypertension), and Transverse myelitis (HCC).   Surgical History:   Past Surgical History:  Procedure Laterality Date  . TONSILLECTOMY    . TOTAL HIP ARTHROPLASTY       Social History:   reports that he has been smoking. He has never used smokeless tobacco. He reports that he does not drink alcohol.   Family History:  His family history includes CAD in his brother, father, and mother; Diabetes in his brother, father, and mother.   Allergies No Known Allergies   Home Medications  Prior to Admission  medications   Medication Sig Start Date End Date Taking? Authorizing Provider  Alogliptin Benzoate 12.5 MG TABS Take 12.5 mg by mouth daily.   Yes [provider]  aspirin 81 MG chewable tablet Chew 81 mg by mouth daily.   Yes [provider]  cyclobenzaprine (FLEXERIL) 10 MG tablet Take 10 mg by mouth 3 (three) times daily as needed for muscle spasms.   Yes [provider]  Docusate Sodium (DSS) 100 MG CAPS Take 200 mg by mouth 2 (two) times daily.   Yes [provider]  DULoxetine (CYMBALTA) 60 MG capsule Take 120 mg by mouth daily.   Yes [provider]  gabapentin (NEURONTIN) 600 MG tablet Take 1,200 mg by mouth 3 (three) times daily.   Yes [provider]  guaiFENesin (MUCINEX) 600 MG 12 hr tablet Take 600 mg by mouth 2 (two) times daily.   Yes [provider]  hydrOXYzine (ATARAX/VISTARIL) 50 MG tablet Take 50 mg by mouth every 6 (six) hours as needed for anxiety.   Yes [provider]  ibuprofen (ADVIL) 800 MG tablet Take 800 mg by mouth every 8 (eight) hours as needed for mild pain.   Yes [provider]  latanoprost (XALATAN) 0.005 % ophthalmic solution Place 1 drop into both eyes at bedtime.   Yes [provider]  lisinopril (ZESTRIL) 40 MG tablet Take 20 mg by mouth 2 (two) times daily.   Yes [provider]  loratadine (CLARITIN) 10 MG tablet Take 10 mg by mouth daily.   Yes [provider]  metFORMIN (GLUCOPHAGE) 1000 MG tablet Take 1,000 mg by mouth 2 (two) times daily.   Yes [provider]  metoprolol (TOPROL-XL) 200 MG 24 hr tablet Take 200 mg by mouth daily.   Yes [provider]  morphine (MS CONTIN) 15 MG 12 hr tablet Take 15 mg by mouth 2 (two) times daily.   Yes [provider]  omeprazole (PRILOSEC) 20 MG capsule Take 20 mg by mouth 2 (two) times daily.   Yes [provider]  oxyCODONE-acetaminophen (PERCOCET/ROXICET) 5-325 MG tablet  Take 1 tablet by mouth 2 (two) times daily as needed for breakthrough pain.   Yes [provider]  prazosin (MINIPRESS) 1 MG capsule Take 1  mg by mouth 2 (two) times daily as needed (PTSD symptoms).   Yes [provider]  prazosin (MINIPRESS) 2 MG capsule Take 4 mg by mouth at bedtime.   Yes [provider]  promethazine (PHENERGAN) 25 MG tablet Take 25 mg by mouth 3 (three) times daily as needed for nausea.   Yes [provider]  sildenafil (VIAGRA) 100 MG tablet Take 50 mg by mouth daily as needed for erectile dysfunction.   Yes [provider]  simvastatin (ZOCOR) 80 MG tablet Take 80 mg by mouth every evening.   Yes [provider]  traZODone (DESYREL) 150 MG tablet Take 150 mg by mouth at bedtime.   Yes [provider]       DVT/GI PRX  assessed I Assessed the need for Labs I Assessed the need for Foley I Assessed the need for Central Venous Line Family Discussion when available I Assessed the need for Mobilization I made an Assessment of medications to be adjusted accordingly Safety Risk assessment completed  CASE DISCUSSED IN MULTIDISCIPLINARY ROUNDS WITH ICU TEAM     Critical Care Time devoted to patient care services described in this note is 65 minutes.  Critical care was necessary to treat /prevent imminent and life-threatening deterioration. Overall, patient is critically ill, prognosis is guarded.  Patient with Multiorgan failure and at high risk for cardiac arrest and death.    Lucie Leather, M.D.  [facility]  Medical Director [facility] Medical Director [facility] Cardio-Pulmonary Department

## 2020-06-06 NOTE — Progress Notes (Signed)
   06/06/20 0121 06/06/20 0132 06/06/20 0234  Assess: MEWS Score  Temp  --  (!) 97.4 F (36.3 C) (!) 103.1 F (39.5 C)  BP (!) 195/72 (!) 191/75 (!) 186/80  Pulse Rate 71 76 (!) 103  Resp (!) 40 (!) 43 (!) 43  SpO2 92 % 98 % 94 %  O2 Device  --  Room Air Room Air    06/06/20 0255  Assess: MEWS Score  Temp  --   BP (!) 179/80  Pulse Rate 90  Resp (!) 41  SpO2 93 %  O2 Device Room Air

## 2020-06-06 NOTE — Progress Notes (Signed)
RT assisted with emergent bedside bronchoscopy. Time out performed by Dr Belia Heman prior to procedure. No complications arose during procedure.

## 2020-06-06 NOTE — TOC Initial Note (Signed)
Transition of Care (TOC) - Initial/Assessment Note    Patient Details  Name: Wyatt Ball. MRN: [id number] Date of Birth: October 03, 1960  Transition of Care [facility]) CM/SW Contact:    Maree Krabbe, LCSW Phone Number: 06/06/2020, 2:30 PM  Clinical Narrative:   CSW spoke with pt's brother [name] via telephone. Pt's brother states pt lives with him however, pt's brother has a heart condition and states he is agreeable for pt to go to SNF prior to returning home. Pt's brother states he is service connected at [facility]. CSW has left a voicemail with SW supervisor Carvel Getting call back.                Expected Discharge Plan: Skilled Nursing Facility Barriers to Discharge: Continued Medical Work up   Patient Goals and CMS Choice Patient states their goals for this hospitalization and ongoing recovery are:: for pt to go to SNF- per brother   Choice offered to / list presented to : Sibling  Expected Discharge Plan and Services Expected Discharge Plan: Skilled Nursing Facility In-house Referral: NA   Post Acute Care Choice: Skilled Nursing Facility Living arrangements for the past 2 months: Single Family Home                                      Prior Living Arrangements/Services Living arrangements for the past 2 months: Single Family Home Lives with:: Siblings Patient language and need for interpreter reviewed:: Yes        Need for Family Participation in Patient Care: Yes (Comment) Care giver support system in place?: Yes (comment)   Criminal Activity/Legal Involvement Pertinent to Current Situation/Hospitalization: No - Comment as needed  Activities of Daily Living   ADL Screening (condition at time of admission) Patient's cognitive ability adequate to safely complete daily activities?: No Is the patient deaf or have difficulty hearing?: No Does the patient have difficulty dressing or bathing?: Yes Independently performs ADLs?: No Communication: Needs  assistance Dressing (OT): Needs assistance Grooming: Needs assistance Is this a change from baseline?: Change from baseline, expected to last >3 days Feeding: Needs assistance Is this a change from baseline?: Change from baseline, expected to last >3 days Bathing: Needs assistance Toileting: Needs assistance Does the patient have difficulty walking or climbing stairs?: Yes Weakness of Legs: Both Weakness of Arms/Hands: Both  Permission Sought/Granted Permission sought to share information with : Family Supports    Share Information with NAME: [name]  Permission granted to share info w AGENCY: [employer]  Permission granted to share info w Relationship: brother     Emotional Assessment Appearance:: Appears stated age Attitude/Demeanor/Rapport: Unable to Assess Affect (typically observed): Unable to Assess Orientation: : Oriented to Self Alcohol / Substance Use: Not Applicable Psych Involvement: No (comment)  Admission diagnosis:  Sepsis (HCC) [A41.9] Altered mental status, unspecified altered mental status type [R41.82] Community acquired pneumonia, unspecified laterality [J18.9] Patient Active Problem List   Diagnosis Date Noted  . Urinary retention   . Acute metabolic encephalopathy 06/04/2020  . Sepsis (HCC) 06/04/2020  . CAP (community acquired pneumonia) 06/04/2020  . Neurogenic bladder 06/04/2020  . Chronic, continuous use of opioids 06/04/2020  . Chronic low back pain 06/04/2020  . Type 2 diabetes mellitus without complication (HCC) 06/04/2020  . Migraines 06/04/2020  . Recurrent UTI 06/04/2020  . History of colon polyps 06/04/2020   PCP:  System, Provider Not In Pharmacy:  [place], [place] [address] [place] [address] Phone: [phone] Fax: [phone]     Social Determinants of Health (SDOH) Interventions    Readmission Risk Interventions No flowsheet data found.

## 2020-06-06 NOTE — Consult Note (Signed)
NAME: Wyatt GarbeAlfred Cablevision SystemsMeierdiercks Jr.  DOB: 10/07/1960  MRN: [id number]  Date/Time: 06/06/2020 10:51 AM  REQUESTING PROVIDER: Dr. Allena KatzPatel  Subjective:  REASON FOR CONSULT: Fever ?No history available from patient.  Spoke to his brother. Wyatt Mikesell Montez HagemanJr. is a 60 y.o. with a history of hypertension, diabetes mellitus, transverse myelitis presented via EMS on 06/04/2020 with altered mental status and fever.  EMS recorded a temperature of 101.9.  Patient lives in the same place as his brother.  As per his patient's brother he has had fever nausea vomiting and cough for the past 3 days.  He has been feeling very tired.  Since yesterday he has been confused.  Patient is a smoker uses 1 pack/day.  He is also on pain management medication. No recent travel, no antibiotic use, no recent hospitalization.  Colonoscopy a month ago. He has 2 dogs  brother has not been sick Patient has had vaccination for COVID  In the ED vitals were BP of 167/67, temperature 99.3, sats 96%, pulse rate of 99. Labs revealed sodium of 130, potassium 3.2, creatinine 1.06, bilirubin 1, lactate of 1.2 which increased to 3.1 and then back to 1.9.  WBC was 13.6, Hb 13.6, platelet 165.  Blood cultures were sent.  CT abdomen and chest revealed dense consolidation with surrounding groundglass density in the left upper and lower lobe consistent with pneumonia.  There was also multiple small nodules and nodular foci of airspace disease in the left upper lobe likely infectious in nature.  Patient was started on ceftriaxone and azithromycin. I am asked to see the patient is 1 of 4 bottles of blood cultures come back a staff epidermidis.   Past Medical History:  Diagnosis Date  . Anxiety   . DM (diabetes mellitus) (HCC)   . HTN (hypertension)   . Transverse myelitis [facility](HCC)     Past Surgical History:  Procedure Laterality Date  . TONSILLECTOMY    . TOTAL HIP ARTHROPLASTY      Social History   Socioeconomic History  . Marital status:  Single    Spouse name: Not on file  . Number of children: Not on file  . Years of education: Not on file  . Highest education level: Not on file  Occupational History  . Not on file  Tobacco Use  . Smoking status: Current Every Day Smoker  . Smokeless tobacco: Never Used  Substance and Sexual Activity  . Alcohol use: Never  . Drug use: Not on file  . Sexual activity: Not on file  Other Topics Concern  . Not on file  Social History Narrative  . Not on file   Social Determinants of Health   Financial Resource Strain: Not on file  Food Insecurity: Not on file  Transportation Needs: Not on file  Physical Activity: Not on file  Stress: Not on file  Social Connections: Not on file  Intimate Partner Violence: Not on file    Family History  Problem Relation Age of Onset  . CAD Mother   . Diabetes Mother   . CAD Father   . Diabetes Father   . CAD Brother   . Diabetes Brother    No Known Allergies I? Current Facility-Administered Medications  Medication Dose Route Frequency Provider Last Rate Last Admin  . 0.9 %  sodium chloride infusion   Intravenous Continuous Enedina FinnerPatel, Sona, MD 100 mL/hr at 06/06/20 0757 Rate Change at 06/06/20 0757  . acetaminophen (TYLENOL) tablet 650 mg  650 mg Oral Q6H PRN Girguis,  Onalee Hua, MD   650 mg at 06/04/20 2313   Or  . acetaminophen (TYLENOL) suppository 650 mg  650 mg Rectal Q6H PRN Lewie Chamber, MD   650 mg at 06/06/20 0728  . azithromycin (ZITHROMAX) 500 mg in sodium chloride 0.9 % 250 mL IVPB  500 mg Intravenous Q24H Ronnald Ramp, [facility]   Stopped at 06/06/20 0037  . cefTRIAXone (ROCEPHIN) 2 g in sodium chloride 0.9 % 100 mL IVPB  2 g Intravenous Q24H Lewie Chamber, MD 200 mL/hr at 06/06/20 0454 2 g at 06/06/20 0454  . Chlorhexidine Gluconate Cloth 2 % PADS 6 each  6 each Topical Daily Lewie Chamber, MD   6 each at 06/06/20 1028  . enoxaparin (LOVENOX) injection 45 mg  0.5 mg/kg Subcutaneous Q24H Lewie Chamber, MD   45 mg at 06/05/20 2039  .  hydrALAZINE (APRESOLINE) injection 10 mg  10 mg Intravenous Q4H PRN Lewie Chamber, MD   10 mg at 06/06/20 0457  . HYDROcodone-acetaminophen (NORCO/VICODIN) 5-325 MG per tablet 1-2 tablet  1-2 tablet Oral Q4H PRN Lewie Chamber, MD      . insulin aspart (novoLOG) injection 0-15 Units  0-15 Units Subcutaneous Q4H Lewie Chamber, MD   3 Units at 06/06/20 0320  . labetalol (NORMODYNE) injection 10 mg  10 mg Intravenous Q4H PRN Lewie Chamber, MD   10 mg at 06/06/20 0728  . ondansetron (ZOFRAN) tablet 4 mg  4 mg Oral Q6H PRN Lewie Chamber, MD       Or  . ondansetron [facility]) injection 4 mg  4 mg Intravenous Q6H PRN Lewie Chamber, MD      . potassium chloride SA (KLOR-CON) CR tablet 40 mEq  40 mEq Oral Once Ronnald Ramp, RPH         Abtx:  Anti-infectives (From admission, onward)   Start     Dose/Rate Route Frequency Ordered Stop   06/05/20 0600  cefTRIAXone (ROCEPHIN) 2 g in sodium chloride 0.9 % 100 mL IVPB        2 g 200 mL/hr over 30 Minutes Intravenous Every 24 hours 06/04/20 2029     06/04/20 2200  azithromycin (ZITHROMAX) 500 mg in sodium chloride 0.9 % 250 mL IVPB        500 mg 250 mL/hr over 60 Minutes Intravenous Every 24 hours 06/04/20 2029 06/07/20 2159   06/04/20 1915  ceFEPIme (MAXIPIME) 2 g in sodium chloride 0.9 % 100 mL IVPB        2 g 200 mL/hr over 30 Minutes Intravenous  Once 06/04/20 1906 06/04/20 2055   06/04/20 1915  metroNIDAZOLE (FLAGYL) IVPB 500 mg        500 mg 100 mL/hr over 60 Minutes Intravenous  Once 06/04/20 1906 06/04/20 2055   06/04/20 1915  vancomycin (VANCOCIN) IVPB 1000 mg/200 mL premix  Status:  Discontinued        1,000 mg 200 mL/hr over 60 Minutes Intravenous  Once 06/04/20 1906 06/04/20 1912   06/04/20 1915  vancomycin (VANCOREADY) IVPB 1750 mg/350 mL        1,750 mg 175 mL/hr over 120 Minutes Intravenous  Once 06/04/20 1912 06/04/20 2257      REVIEW OF SYSTEMS:  NA?  : Objective:  VITALS:  BP (!) 191/79   Pulse 81   Temp 100 F (37.8  C)   Resp (!) 30   Ht 5\' 6"  (1.676 m)   Wt 95.6 kg   SpO2 93%   BMI 34.02 kg/m  PHYSICAL EXAM:  General: awake, non verbal. respiratory distress Followed some commands like turning to the side, opening his mouth Head: Normocephalic, without obvious abnormality, atraumatic. Eyes: Conjunctivae clear, anicteric sclerae. Pupils are equal ENT Nares normal. No drainage or sinus tenderness. Poor dentition Neck: Supple, symmetrical, no adenopathy, thyroid: non tender no carotid bruit and no JVD. Back: No CVA tenderness. Lungs: decreased air entry left side- crepts present. Heart: Tachycardia Abdomen: Soft, non-tender,not distended. Bowel sounds normal. No masses Extremities: atraumatic, no cyanosis. No edema. No clubbing Skin: No rashes or lesions. Or bruising Lymph: Cervical, supraclavicular normal. Neurologic: cannot assess Pertinent Labs Lab Results CBC    Component Value Date/Time   WBC 12.3 (H) 06/05/2020 0716   RBC 5.00 06/05/2020 0716   HGB 14.6 06/05/2020 0716   HCT 43.1 06/05/2020 0716   PLT 132 (L) 06/05/2020 0716   MCV 86.2 06/05/2020 0716   MCH 29.2 06/05/2020 0716   MCHC 33.9 06/05/2020 0716   RDW 14.1 06/05/2020 0716    CMP Latest Ref Rng & Units 06/05/2020 06/04/2020  Glucose 70 - 99 mg/dL 353(I) 144(R)  BUN 8 - 23 mg/dL 21 20  Creatinine 1.54 - 1.24 mg/dL 0.08 6.76  Sodium 195 - 145 mmol/L 133(L) 130(L)  Potassium 3.5 - 5.1 mmol/L 3.2(L) 3.2(L)  Chloride 98 - 111 mmol/L 100 95(L)  CO2 22 - 32 mmol/L 18(L) 21(L)  Calcium 8.9 - 10.3 mg/dL 8.3(L) 8.8(L)  Total Protein 6.5 - 8.1 g/dL - 6.9  Total Bilirubin 0.3 - 1.2 mg/dL - 1.0  Alkaline Phos 38 - 126 U/L - 61  AST 15 - 41 U/L - 14(L)  ALT 0 - 44 U/L - 11      Microbiology: Recent Results (from the past 240 hour(s))  Blood culture (single)     Status: None (Preliminary result)   Collection Time: 06/04/20  5:18 PM   Specimen: BLOOD  Result Value Ref Range Status   Specimen Description BLOOD BLOOD RIGHT  HAND  Final   Special Requests   Final    BOTTLES DRAWN AEROBIC AND ANAEROBIC Blood Culture adequate volume   Culture  Setup Time   Final    GRAM POSITIVE COCCI Organism ID to follow ANAEROBIC BOTTLE ONLY CRITICAL RESULT CALLED TO, READ BACK BY AND VERIFIED WITH: Marisue Brooklyn [id number] 06/06/2020 DLB Performed at [facility], [address], [place], [place] [address]    Culture GRAM POSITIVE COCCI  Final   Report Status PENDING  Incomplete  Resp Panel by RT-PCR (Flu A&B, Covid) Nasopharyngeal Swab     Status: None   Collection Time: 06/04/20  5:18 PM   Specimen: Nasopharyngeal Swab; Nasopharyngeal(NP) swabs in vial transport medium  Result Value Ref Range Status   SARS Coronavirus 2 by RT PCR NEGATIVE NEGATIVE Final    Comment: (NOTE) SARS-CoV-2 target nucleic acids are NOT DETECTED.  The SARS-CoV-2 RNA is generally detectable in upper respiratory specimens during the acute phase of infection. The lowest concentration of SARS-CoV-2 viral copies this assay can detect is 138 copies/mL. A negative result does not preclude SARS-Cov-2 infection and should not be used as the sole basis for treatment or other patient management decisions. A negative result may occur with  improper specimen collection/handling, submission of specimen other than nasopharyngeal swab, presence of viral mutation(s) within the areas targeted by this assay, and inadequate number of viral copies(<138 copies/mL). A negative result must be combined with clinical observations, patient history, and epidemiological information. The expected result is Negative.  Fact Sheet for  Patients:  BloggerCourse.com  Fact Sheet for Healthcare Providers:  SeriousBroker.it  This test is no t yet approved or cleared by the [place] FDA and  has been authorized for detection and/or diagnosis of SARS-CoV-2 by FDA under an Emergency Use Authorization (EUA). This EUA  will remain  in effect (meaning this test can be used) for the duration of the COVID-19 declaration under Section 564(b)(1) of the Act, 21 U.S.C.section 360bbb-3(b)(1), unless the authorization is terminated  or revoked sooner.       Influenza A by PCR NEGATIVE NEGATIVE Final   Influenza B by PCR NEGATIVE NEGATIVE Final    Comment: (NOTE) The Xpert Xpress SARS-CoV-2/FLU/RSV plus assay is intended as an aid in the diagnosis of influenza from Nasopharyngeal swab specimens and should not be used as a sole basis for treatment. Nasal washings and aspirates are unacceptable for Xpert Xpress SARS-CoV-2/FLU/RSV testing.  Fact Sheet for Patients: BloggerCourse.com  Fact Sheet for Healthcare Providers: SeriousBroker.it  This test is not yet approved or cleared by the [place] FDA and has been authorized for detection and/or diagnosis of SARS-CoV-2 by FDA under an Emergency Use Authorization (EUA). This EUA will remain in effect (meaning this test can be used) for the duration of the COVID-19 declaration under Section 564(b)(1) of the Act, 21 U.S.C. section 360bbb-3(b)(1), unless the authorization is terminated or revoked.  Performed at [facility], [address] [place], [place] [address]   Blood Culture ID Panel (Reflexed)     Status: Abnormal   Collection Time: 06/04/20  5:18 PM  Result Value Ref Range Status   Enterococcus faecalis NOT DETECTED NOT DETECTED Final   Enterococcus Faecium NOT DETECTED NOT DETECTED Final   Listeria monocytogenes NOT DETECTED NOT DETECTED Final   Staphylococcus species DETECTED (A) NOT DETECTED Final    Comment: CRITICAL RESULT CALLED TO, READ BACK BY AND VERIFIED WITH: [facility] MITCHELL 0722 06/06/2020 DLB    Staphylococcus aureus (BCID) NOT DETECTED NOT DETECTED Final   Staphylococcus epidermidis DETECTED (A) NOT DETECTED Final    Comment: CRITICAL RESULT CALLED TO, READ BACK BY AND  VERIFIED WITH: Marisue Brooklyn [id number] 06/06/2020 DLB    Staphylococcus lugdunensis NOT DETECTED NOT DETECTED Final   Streptococcus species NOT DETECTED NOT DETECTED Final   Streptococcus agalactiae NOT DETECTED NOT DETECTED Final   Streptococcus pneumoniae NOT DETECTED NOT DETECTED Final   Streptococcus pyogenes NOT DETECTED NOT DETECTED Final   A.calcoaceticus-baumannii NOT DETECTED NOT DETECTED Final   Bacteroides fragilis NOT DETECTED NOT DETECTED Final   Enterobacterales NOT DETECTED NOT DETECTED Final   Enterobacter cloacae complex NOT DETECTED NOT DETECTED Final   Escherichia coli NOT DETECTED NOT DETECTED Final   Klebsiella aerogenes NOT DETECTED NOT DETECTED Final   Klebsiella oxytoca NOT DETECTED NOT DETECTED Final   Klebsiella pneumoniae NOT DETECTED NOT DETECTED Final   Proteus species NOT DETECTED NOT DETECTED Final   Salmonella species NOT DETECTED NOT DETECTED Final   Serratia marcescens NOT DETECTED NOT DETECTED Final   Haemophilus influenzae NOT DETECTED NOT DETECTED Final   Neisseria meningitidis NOT DETECTED NOT DETECTED Final   Pseudomonas aeruginosa NOT DETECTED NOT DETECTED Final   Stenotrophomonas maltophilia NOT DETECTED NOT DETECTED Final   Candida albicans NOT DETECTED NOT DETECTED Final   Candida auris NOT DETECTED NOT DETECTED Final   Candida glabrata NOT DETECTED NOT DETECTED Final   Candida krusei NOT DETECTED NOT DETECTED Final   Candida parapsilosis NOT DETECTED NOT DETECTED Final   Candida tropicalis NOT DETECTED  NOT DETECTED Final   Cryptococcus neoformans/gattii NOT DETECTED NOT DETECTED Final   Methicillin resistance mecA/C NOT DETECTED NOT DETECTED Final    Comment: Performed at [facility], [address] [place], [place] [address]  Blood culture (single)     Status: None (Preliminary result)   Collection Time: 06/04/20  9:48 PM   Specimen: BLOOD  Result Value Ref Range Status   Specimen Description BLOOD BLOOD RIGHT ARM  Final    Special Requests   Final    BOTTLES DRAWN AEROBIC AND ANAEROBIC Blood Culture adequate volume   Culture   Final    NO GROWTH < 12 HOURS Performed at [facility], [address] [place], [place] [address]    Report Status PENDING  Incomplete    IMAGING RESULTS:  I have personally reviewed the films ? Impression/Recommendation Left lung pneumonia with respiratory distress- CAP- no recent hospitalization or antibiotics- MRSA sent and is negative Continue ceftriaxone and azithromycin to cover strep pneumo, H influenza, kleb, atypicals like legionella  ? ?Encephalopathy- could be due to pneumonia- legionella is a concern' Could be from his medications Less likely this is CNS infection  Chronic pain syndrome  Smoker- could have underlying undiagnosed COPD   ? ___________________________________________________ Discussed with brother and care team

## 2020-06-06 NOTE — NC FL2 (Signed)
[place] MEDICAID FL2 LEVEL OF CARE SCREENING TOOL     IDENTIFICATION  Patient Name: Wyatt Ball. Birthdate: [birth year] Sex: male Admission Date (Current Location): 06/04/2020  [place] and [place] Number:  Chiropodist and Address:  [facility], [address], [place], [place] [address]      Provider Number: [id number]  Attending Physician Name and Address:  Enedina Finner, MD  Relative Name and Phone Number:       Current Level of Care: Hospital Recommended Level of Care: Skilled Nursing Facility Prior Approval Number:    Date Approved/Denied:   PASRR Number: [id number]  Discharge Plan: SNF    Current Diagnoses: Patient Active Problem List   Diagnosis Date Noted  . Urinary retention   . Acute metabolic encephalopathy 06/04/2020  . Sepsis (HCC) 06/04/2020  . CAP (community acquired pneumonia) 06/04/2020  . Neurogenic bladder 06/04/2020  . Chronic, continuous use of opioids 06/04/2020  . Chronic low back pain 06/04/2020  . Type 2 diabetes mellitus without complication (HCC) 06/04/2020  . Migraines 06/04/2020  . Recurrent UTI 06/04/2020  . History of colon polyps 06/04/2020    Orientation RESPIRATION BLADDER Height & Weight     Self  Normal Indwelling catheter,Incontinent (placed 6/1) Weight: 210 lb 12.2 oz (95.6 kg) Height:  5\' 6"  (167.6 cm)  BEHAVIORAL SYMPTOMS/MOOD NEUROLOGICAL BOWEL NUTRITION STATUS      Incontinent Diet (Diet subject to change please see dc summary)  AMBULATORY STATUS COMMUNICATION OF NEEDS Skin   Extensive Assist Verbally Normal                       Personal Care Assistance Level of Assistance  Bathing,Dressing,Feeding Bathing Assistance: Maximum assistance Feeding assistance: Independent Dressing Assistance: Maximum assistance     Functional Limitations Info  Sight,Hearing,Speech Sight Info: Adequate Hearing Info: Adequate Speech Info: Adequate    SPECIAL CARE FACTORS FREQUENCY   PT (By licensed PT),OT (By licensed OT)     PT Frequency: 5x OT Frequency: 5x            Contractures Contractures Info: Not present    Additional Factors Info  Code Status,Allergies Code Status Info: Full Code Allergies Info: no known allergies           Current Medications (06/06/2020):  This is the current hospital active medication list Current Facility-Administered Medications  Medication Dose Route Frequency Provider Last Rate Last Admin  . 0.9 %  sodium chloride infusion   Intravenous Continuous 08/06/2020, MD 100 mL/hr at 06/06/20 0757 Rate Change at 06/06/20 0757  . acetaminophen (TYLENOL) tablet 650 mg  650 mg Oral Q6H PRN 08/06/20, MD   650 mg at 06/04/20 2313   Or  . acetaminophen (TYLENOL) suppository 650 mg  650 mg Rectal Q6H PRN 06/06/20, MD   650 mg at 06/06/20 0728  . azithromycin (ZITHROMAX) 500 mg in sodium chloride 0.9 % 250 mL IVPB  500 mg Intravenous Q24H 08/06/20, [facility]   Stopped at 06/06/20 0037  . cefTRIAXone (ROCEPHIN) 2 g in sodium chloride 0.9 % 100 mL IVPB  2 g Intravenous Q24H 08/06/20, MD   Stopped at 06/06/20 0600  . Chlorhexidine Gluconate Cloth 2 % PADS 6 each  6 each Topical Daily 08/06/20, MD   6 each at 06/06/20 1028  . enoxaparin (LOVENOX) injection 45 mg  0.5 mg/kg Subcutaneous Q24H 08/06/20, MD   45 mg at 06/05/20 2039  . hydrALAZINE (APRESOLINE)  injection 10 mg  10 mg Intravenous QID Enedina Finner, MD   10 mg at 06/06/20 1448  . HYDROcodone-acetaminophen (NORCO/VICODIN) 5-325 MG per tablet 1-2 tablet  1-2 tablet Oral Q4H PRN Lewie Chamber, MD      . insulin aspart (novoLOG) injection 0-15 Units  0-15 Units Subcutaneous Q4H Lewie Chamber, MD   3 Units at 06/06/20 0320  . labetalol (NORMODYNE) injection 10 mg  10 mg Intravenous Q4H PRN Lewie Chamber, MD   10 mg at 06/06/20 0728  . morphine 2 MG/ML injection 0.5 mg  0.5 mg Intravenous Q6H PRN Enedina Finner, MD   0.5 mg at 06/06/20 1449  . ondansetron  (ZOFRAN) tablet 4 mg  4 mg Oral Q6H PRN Lewie Chamber, MD       Or  . ondansetron [facility]) injection 4 mg  4 mg Intravenous Q6H PRN Lewie Chamber, MD      . potassium chloride SA (KLOR-CON) CR tablet 40 mEq  40 mEq Oral Once Ronnald Ramp, [facility]         Discharge Medications: Please see discharge summary for a list of discharge medications.  Relevant Imaging Results:  Relevant Lab Results:   Additional Information [id number]  Maree Krabbe, LCSW

## 2020-06-06 NOTE — Progress Notes (Signed)
[facility]  - [place] at [facility]   PATIENT NAME: Wyatt Ball    MR#:  [id number]  DATE OF BIRTH:  September 19, 1960  SUBJECTIVE:   Patient awake and alert. Appears spaced out. Although answers for questions appropriately. Having fever of 101.3 unable to obtain any other history. Speech therapist was not able to work with him given his cognitive decline REVIEW OF SYSTEMS:   Review of Systems  Constitutional: Negative for chills, fever and weight loss.  HENT: Negative for ear discharge, ear pain and nosebleeds.   Eyes: Negative for blurred vision, pain and discharge.  Respiratory: Negative for sputum production, shortness of breath, wheezing and stridor.   Cardiovascular: Negative for chest pain, palpitations, orthopnea and PND.  Gastrointestinal: Negative for abdominal pain, diarrhea, nausea and vomiting.  Genitourinary: Negative for frequency and urgency.  Musculoskeletal: Negative for back pain and joint pain.  Neurological: Negative for sensory change, speech change, focal weakness and weakness.  Psychiatric/Behavioral: Negative for depression and hallucinations. The patient is not nervous/anxious.    Tolerating Diet:npo Tolerating PT: SNF  DRUG ALLERGIES:  No Known Allergies  VITALS:  Blood pressure (!) 176/80, pulse 85, temperature 99.1 F (37.3 C), resp. rate (!) 40, height 5\' 6"  (1.676 m), weight 95.6 kg, SpO2 94 %.  PHYSICAL EXAMINATION:   Physical Exam Limited GENERAL:  60 y.o.-year-old patient lying in the bed with no acute distress. Disheveled Poor oral hygiene LUNGS: decreased breath sounds bilaterally, no wheezing, rales, rhonchi. No use of accessory muscles of respiration.  CARDIOVASCULAR: S1, S2 normal. No murmurs, rubs, or gallops. Tachycardia ABDOMEN: Soft, nontender, nondistended. Bowel sounds present. No organomegaly or mass.  EXTREMITIES: No cyanosis, clubbing or edema b/l.    NEUROLOGIC: limited exam due to patient not much able to  participate. Able to move all extremities well.  PSYCHIATRIC:  patient is alert  SKIN: No obvious rash, lesion, or ulcer.   LABORATORY PANEL:  CBC Recent Labs  Lab 06/05/20 0716  WBC 12.3*  HGB 14.6  HCT 43.1  PLT 132*    Chemistries  Recent Labs  Lab 06/04/20 1718 06/05/20 0838  NA 130* 133*  K 3.2* 3.2*  CL 95* 100  CO2 21* 18*  GLUCOSE 249* 240*  BUN 20 21  CREATININE 1.06 1.06  CALCIUM 8.8* 8.3*  AST 14*  --   ALT 11  --   ALKPHOS 61  --   BILITOT 1.0  --    Cardiac Enzymes No results for input(s): TROPONINI in the last 168 hours. RADIOLOGY:  DG Chest 1 View  Result Date: 06/04/2020 CLINICAL DATA:  Altered mental status EXAM: CHEST  1 VIEW COMPARISON:  08/07/2008 FINDINGS: Cardiac shadow is enlarged. Aortic calcifications are noted. Diffuse left basilar airspace opacity is noted consistent with acute pneumonia. No sizable effusion is seen. No bony abnormality is noted. IMPRESSION: Left basilar pneumonia. Electronically Signed   By: 10/07/2008 M.D.   On: 06/04/2020 18:27   CT Head Wo Contrast  Result Date: 06/04/2020 CLINICAL DATA:  60 year old male with altered mental status. EXAM: CT HEAD WITHOUT CONTRAST CT CERVICAL SPINE WITHOUT CONTRAST TECHNIQUE: Multidetector CT imaging of the head and cervical spine was performed following the standard protocol without intravenous contrast. Multiplanar CT image reconstructions of the cervical spine were also generated. COMPARISON:  Head CT dated 08/07/2008. FINDINGS: CT HEAD FINDINGS Brain: The ventricles and sulci appropriate size for patient's age. The gray-white matter discrimination is preserved. There is no acute intracranial hemorrhage. No mass effect or  midline shift. No extra-axial fluid collection. Vascular: No hyperdense vessel or unexpected calcification. Skull: Normal. Negative for fracture or focal lesion. Sinuses/Orbits: No acute finding. Other: None CT CERVICAL SPINE FINDINGS Alignment: No acute subluxation. There  is straightening of normal cervical lordosis which may be positional or due to muscle spasm. Skull base and vertebrae: No acute fracture. Osteopenia. Soft tissues and spinal canal: No prevertebral fluid or swelling. No visible canal hematoma. Disc levels: Degenerative changes with disc space narrowing and endplate irregularity. Upper chest: Negative. Other: Exophytic thyroid tissue versus a 15 mm exophytic thyroid or parathyroid nodule on the right. Recommend thyroid US (ref: J Am Coll Radiol. 2015 Feb;12(2): 143-50). IMPRESSION: 1. No acute intracranial pathology. 2. No acute/traumatic cervical spine pathology. 3. Possible 15 mm exophytic thyroid or parathyroid nodule on the right. Further evaluation with ultrasound on a nonemergent/outpatient basis is recommended. Electronically Signed   By: Elgie Collard M.D.   On: 06/04/2020 18:19   CT Cervical Spine Wo Contrast  Result Date: 06/04/2020 CLINICAL DATA:  60 year old male with altered mental status. EXAM: CT HEAD WITHOUT CONTRAST CT CERVICAL SPINE WITHOUT CONTRAST TECHNIQUE: Multidetector CT imaging of the head and cervical spine was performed following the standard protocol without intravenous contrast. Multiplanar CT image reconstructions of the cervical spine were also generated. COMPARISON:  Head CT dated 08/07/2008. FINDINGS: CT HEAD FINDINGS Brain: The ventricles and sulci appropriate size for patient's age. The gray-white matter discrimination is preserved. There is no acute intracranial hemorrhage. No mass effect or midline shift. No extra-axial fluid collection. Vascular: No hyperdense vessel or unexpected calcification. Skull: Normal. Negative for fracture or focal lesion. Sinuses/Orbits: No acute finding. Other: None CT CERVICAL SPINE FINDINGS Alignment: No acute subluxation. There is straightening of normal cervical lordosis which may be positional or due to muscle spasm. Skull base and vertebrae: No acute fracture. Osteopenia. Soft tissues and  spinal canal: No prevertebral fluid or swelling. No visible canal hematoma. Disc levels: Degenerative changes with disc space narrowing and endplate irregularity. Upper chest: Negative. Other: Exophytic thyroid tissue versus a 15 mm exophytic thyroid or parathyroid nodule on the right. Recommend thyroid US (ref: J Am Coll Radiol. 2015 Feb;12(2): 143-50). IMPRESSION: 1. No acute intracranial pathology. 2. No acute/traumatic cervical spine pathology. 3. Possible 15 mm exophytic thyroid or parathyroid nodule on the right. Further evaluation with ultrasound on a nonemergent/outpatient basis is recommended. Electronically Signed   By: Elgie Collard M.D.   On: 06/04/2020 18:19   CT CHEST ABDOMEN PELVIS WO CONTRAST  Result Date: 06/04/2020 CLINICAL DATA:  Altered mental status febrile cough EXAM: CT CHEST, ABDOMEN AND PELVIS WITHOUT CONTRAST TECHNIQUE: Multidetector CT imaging of the chest, abdomen and pelvis was performed following the standard protocol without IV contrast. COMPARISON:  Chest x-ray 06/04/2020 FINDINGS: CT CHEST FINDINGS Cardiovascular: Limited evaluation without intravenous contrast. Mild aortic atherosclerosis without aneurysmal dilatation. Coronary vascular calcification. Mild cardiomegaly. Trace pericardial effusion Mediastinum/Nodes: Midline trachea. No thyroid mass. Multiple subcentimeter mediastinal lymph nodes. Esophagus within normal limits Lungs/Pleura: No pleural effusion or pneumothorax. Consolidation with ground-glass density in the lingula, posterior left upper lobe and superior segment of left lower lobe. Multiple small nodules or nodular foci of airspace disease in the left upper lobe likely infectious or inflammatory. Musculoskeletal: Sternum is intact.  No acute osseous abnormality. CT ABDOMEN PELVIS FINDINGS Hepatobiliary: No focal liver abnormality is seen. No gallstones, gallbladder wall thickening, or biliary dilatation. Pancreas: Unremarkable. No pancreatic ductal dilatation  or surrounding inflammatory changes. Spleen: Normal in size without focal  abnormality. Adrenals/Urinary Tract: Adrenal glands are normal. Nonspecific perinephric fat stranding. No hydronephrosis. The bladder is unremarkable Stomach/Bowel: Stomach is within normal limits. Appendix appears normal. No evidence of bowel wall thickening, distention, or inflammatory changes. Vascular/Lymphatic: Mild aortic atherosclerosis without aneurysm. No suspicious nodes. Reproductive: Prostate is unremarkable. Other: Negative for free air or free fluid. Musculoskeletal: Bilateral hip replacements with artifact. No acute osseous abnormality. IMPRESSION: 1. Dense consolidation with surrounded ground-glass density in the left upper and lower lobes consistent with pneumonia. Multiple small nodules or nodular foci of airspace disease in the left upper lobe likely infectious or inflammatory. Imaging follow-up to resolution is recommended. 2. No CT evidence for acute intra-abdominal or intrapelvic abnormality. There is nonspecific perinephric fat stranding but no hydronephrosis. 3. Cardiomegaly with trace pericardial effusion Electronically Signed   By: Jasmine Pang M.D.   On: 06/04/2020 19:33   ASSESSMENT AND PLAN:  Wyatt Garbe Lumbra Montez Hageman. is a 60 y.o. 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   Severe Sepsis secondary to community-acquired pneumonia POA    Acute metabolic encephalopathy - Sepsis criteria includes fever, tachycardia leukocytosis, lactic acidosis with left upper and lower lobe pneumonia on chest CT and acute mental status changes - COVID and flu test were  negative - Received Vanco, cefepime and Flagyl in the emergency room along with sepsis fluid bolus -received sepsis fluids - IV Rocephin and azithromycin - Follow blood cultures 1/3 Staph epi--likley contamination - Aspiration precautions, fall precautions -  speech therapy to see patient--unable to participate -- Pro calcitonin 0.45-- 4.33 -- lactic acid 3.1--- 1.5 -cont to spike fvere --ID consultation    Neurogenic bladder post transverse myelitis    History of frequent UTIs    History of transverse myelitis - Patient self catheterizes - Coude catheter placed by Dr Lonna Cobb. UC was not sent - Urinalysis without pyuria -- consider neurology consultation since patient's mentation does not clear. --?LP, MRI    Chronic, continuous use of opioids   Chronic low back pain - Patient opioid dependent per patient's brother - Pain control    Type 2 diabetes mellitus without complication (HCC) - Sliding scale insulin coverage    History of colon polyps - Patient had a recent colonoscopy 4/27 at the [facility] with tubular adenoma  Nicotine dependence - Nicotine patch   Uncontrolled HTN --IV Hydralazine schedule  DVT prophylaxis: Lovenox  Code Status: full code  Family Communication: Brother [name] over the phone 6/2 Disposition Plan: Back to previous home environment Consults called: none   TOC for d/c planning. PT recommneds Rehab     TOTAL TIME TAKING CARE OF THIS PATIENT:34minutes.  >50% time spent on counselling and coordination of care  Note: This dictation was prepared with Dragon dictation along with smaller phrase technology. Any transcriptional errors that result from this process are unintentional.  Enedina Finner M.D    [facility]   CC: Primary care physician; System, Provider Not InPatient ID: Toniann Ket., male   DOB: [birth year], 60 y.o.   MRN: [id number]

## 2020-06-06 NOTE — Progress Notes (Signed)
PHARMACY - PHYSICIAN COMMUNICATION CRITICAL VALUE ALERT - BLOOD CULTURE IDENTIFICATION (BCID)  Wyatt Ball. is an 60 y.o. male who presented to [facility] on 06/04/2020 with a chief complaint of confusion and fevers  Assessment:  5/31 blood cultures with GPC in 1 of 4 bottles, BCID = MSSE.  Patient febrile. Concerns for CAP.  PMH of neurogenic bladder and self-caths.    Name of physician (or Provider) Contacted: Dr Eliane Decree  Current antibiotics: Ceftriaxone/azithromycin   Changes to prescribed antibiotics recommended:  Patient is on recommended antibiotics - No changes needed.  Will ask ID to further evaluate ongoing fevers  Results for orders placed or performed during the hospital encounter of 06/04/20  Blood Culture ID Panel (Reflexed) (Collected: 06/04/2020  5:18 PM)  Result Value Ref Range   Enterococcus faecalis NOT DETECTED NOT DETECTED   Enterococcus Faecium NOT DETECTED NOT DETECTED   Listeria monocytogenes NOT DETECTED NOT DETECTED   Staphylococcus species DETECTED (A) NOT DETECTED   Staphylococcus aureus (BCID) NOT DETECTED NOT DETECTED   Staphylococcus epidermidis DETECTED (A) NOT DETECTED   Staphylococcus lugdunensis NOT DETECTED NOT DETECTED   Streptococcus species NOT DETECTED NOT DETECTED   Streptococcus agalactiae NOT DETECTED NOT DETECTED   Streptococcus pneumoniae NOT DETECTED NOT DETECTED   Streptococcus pyogenes NOT DETECTED NOT DETECTED   A.calcoaceticus-baumannii NOT DETECTED NOT DETECTED   Bacteroides fragilis NOT DETECTED NOT DETECTED   Enterobacterales NOT DETECTED NOT DETECTED   Enterobacter cloacae complex NOT DETECTED NOT DETECTED   Escherichia coli NOT DETECTED NOT DETECTED   Klebsiella aerogenes NOT DETECTED NOT DETECTED   Klebsiella oxytoca NOT DETECTED NOT DETECTED   Klebsiella pneumoniae NOT DETECTED NOT DETECTED   Proteus species NOT DETECTED NOT DETECTED   Salmonella species NOT DETECTED NOT DETECTED   Serratia marcescens NOT  DETECTED NOT DETECTED   Haemophilus influenzae NOT DETECTED NOT DETECTED   Neisseria meningitidis NOT DETECTED NOT DETECTED   Pseudomonas aeruginosa NOT DETECTED NOT DETECTED   Stenotrophomonas maltophilia NOT DETECTED NOT DETECTED   Candida albicans NOT DETECTED NOT DETECTED   Candida auris NOT DETECTED NOT DETECTED   Candida glabrata NOT DETECTED NOT DETECTED   Candida krusei NOT DETECTED NOT DETECTED   Candida parapsilosis NOT DETECTED NOT DETECTED   Candida tropicalis NOT DETECTED NOT DETECTED   Cryptococcus neoformans/gattii NOT DETECTED NOT DETECTED   Methicillin resistance mecA/C NOT DETECTED NOT DETECTED   Juliette Alcide, PharmD, BCPS.   Work Cell: [phone] 06/06/2020 10:14 AM

## 2020-06-06 NOTE — Progress Notes (Signed)
Per Dr. Belia Heman prepare patient for intubation.

## 2020-06-06 NOTE — Evaluation (Signed)
Physical Therapy Evaluation Patient Details Name: Wyatt Ball. MRN: [id number] DOB: 1960-05-02 Today's Date: 06/06/2020   History of Present Illness  Wyatt Garbe Wehmeyer Montez Hageman. is a 60 y.o. 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     Clinical Impression  Pt received in Semi-Fowler's position and asking for help.  Pt is able to recall his first name the month of his birthday, however is unable to communicate anything else beyond that.  Pt often requests for help, but is unable to ask what he needs help with.  Sitter is in room with him upon arrival monitoring pt.  Pt is able to lift UE's against gravity, but unable to push against any resistance.  Pt then fatigued quickly and required AAROM for mobilization of the UE's and LE's.  Pt required AAROM for all LE exercises performed.  PT trial will be attempted to assess progress throughout stay during hospital.  Pt will benefit from skilled PT intervention to increase independence and safety with basic mobility in preparation for discharge to the venue listed below.       Follow Up Recommendations SNF    Equipment Recommendations       Recommendations for Other Services       Precautions / Restrictions Restrictions Weight Bearing Restrictions: No      Mobility  Bed Mobility               General bed mobility comments: Unable to perform due to weakness in LE's.    Transfers                    Ambulation/Gait                Stairs            Wheelchair Mobility    Modified Rankin (Stroke Patients Only)       Balance                                              Pertinent Vitals/Pain Pain Assessment:  (no response from pt)    Home Living Family/patient expects to be discharged to:: Other (Comment)                 Additional Comments: Pt unable to communicate effectively to for history taking.    Prior Function                 Hand Dominance        Extremity/Trunk Assessment   Upper Extremity Assessment Upper Extremity Assessment: Generalized weakness    Lower Extremity Assessment Lower Extremity Assessment: Generalized weakness       Communication   Communication: Other (comment) (Difficulty with communication.)  Cognition   Behavior During Therapy: Flat affect Overall Cognitive Status: Difficult to assess                                        General Comments  Exercises Total Joint Exercises Ankle Circles/Pumps: AAROM;Both;10 reps;Supine Hip ABduction/ADduction: AAROM;Both;5 reps;Supine Straight Leg Raises: AAROM;Both;5 reps;Supine General Exercises - Upper Extremity Shoulder Flexion: AAROM;Both;5 reps Shoulder ABduction: AAROM;Both;5 reps Wrist Flexion: AAROM;Both;5 reps Other Exercises Other Exercises: Pt educated on role of PT and services provided during hospital stay.   Assessment/Plan    PT Assessment Patient needs continued PT services  PT Problem List Decreased strength;Decreased activity tolerance;Decreased mobility;Decreased cognition       PT Treatment Interventions DME instruction;Gait training;Functional mobility training;Therapeutic activities;Therapeutic exercise    PT Goals (Current goals can be found in the Care Plan section)  Acute Rehab PT Goals PT Goal Formulation: Patient unable to participate in goal setting    Frequency Min 2X/week   Barriers to discharge        Co-evaluation               AM-PAC PT "6 Clicks" Mobility  Outcome Measure Help needed turning from your back to your side while in a  flat bed without using bedrails?: A Lot Help needed moving from lying on your back to sitting on the side of a flat bed without using bedrails?: A Lot Help needed moving to and from a bed to a chair (including a wheelchair)?: Total Help needed standing up from a chair using your arms (e.g., wheelchair or bedside chair)?: Total Help needed to walk in hospital room?: Total Help needed climbing 3-5 steps with a railing? : Total 6 Click Score: 8    End of Session Equipment Utilized During Treatment: Oxygen Activity Tolerance: Treatment limited secondary to medical complications (Comment) Patient left: in bed;with call bell/phone within reach;with bed alarm set;with nursing/sitter in room Nurse Communication: Mobility status PT Visit Diagnosis: Unsteadiness on feet (R26.81);Muscle weakness (generalized) (M62.81);Difficulty in walking, not elsewhere classified (R26.2);Adult, failure to thrive (R62.7)    Time: 0100-7121 PT Time Calculation (min) (ACUTE ONLY): 16 min   Charges:   PT Evaluation $PT Eval Low Complexity: 1 Low PT Treatments $Therapeutic Exercise: 8-22 mins        Nolon Bussing, PT, DPT 06/06/20, 1:24 PM   Phineas Real 06/06/2020, 1:11 PM

## 2020-06-06 NOTE — Progress Notes (Signed)
Patient with HR 150-160 and BP 222/94.  PRN labetalol administered.  Per Dr. Belia Heman

## 2020-06-06 NOTE — Progress Notes (Signed)
Patient accepted from room 236. Patient tachypnic, tachycardic, hypertensive, and stating "help me". Patient follows some simple commands.  Dr. Belia Heman at bedside to assess patient.

## 2020-06-06 NOTE — Procedures (Signed)
PROCEDURE: BRONCHOSCOPY Therapeutic Aspiration of Tracheobronchial Tree  PROCEDURE DATE: 06/06/2020  TIME:  NAME:  Wyatt Lido Brubacher Jr.  DOB:1960-03-17  MRN: [id number] LOC:  IC12A/IC12A-AA    HOSP DAY:  CODE STATUS:      Code Status Orders  (From admission, onward)         Start     Ordered   06/04/20 2027  Full code  Continuous        06/04/20 2029        Code Status History    This patient has a current code status but no historical code status.   Advance Care Planning Activity          Indications/Preliminary Diagnosis: PNEUMONIA  Consent: (Place X beside choice/s below)  The benefits, risks and possible complications of the procedure were        explained to:  ___ patient  ___ patient's family  ___ other:___________  who verbalized understanding and gave:  ___ verbal  ___ written  ___ verbal and written  ___ telephone  ___ other:________ consent.    X  Unable to obtain consent; procedure performed on emergent basis.     Other:       PRESEDATION ASSESSMENT: History and Physical has been performed. Patient meds and allergies have been reviewed. Presedation airway examination has been performed and documented. Baseline vital signs, sedation score, oxygenation status, and cardiac rhythm were reviewed. Patient was deemed to be in satisfactory condition to undergo the procedure.      PROCEDURE DETAILS: Timeout performed and correct patient, name, & ID confirmed. Following prep per Pulmonary policy, appropriate sedation was administered. The Bronchoscope was inserted in to oral cavity with bite block in place. Therapeutic aspiration of Tracheobronchial tree was performed.  Airway exam proceeded with findings, technical procedures, and specimen collection as noted below. At the end of exam the scope was withdrawn without incident. Impression and Plan as noted below.           Airway Prep (Place X beside choice below)   1% Transtracheal Lidocaine  Anesthetization 7 cc   Patient prepped per Bronchoscopy Lab Policy       Insertion Route (Place X beside choice below)   Nasal   Oral  x Endotracheal Tube   Tracheostomy   INTRAPROCEDURE MEDICATIONS:  Sedative/Narcotic Amt Dose   Versed infusion mg   Fentanyl infusion mcg  Diprivan  mg       Medication Amt Dose  Medication Amt Dose  Lidocaine 1%  cc  Epinephrine 1:10,000 sol  cc  Xylocaine 4%  cc  Cocaine  cc   TECHNICAL PROCEDURES: (Place X beside choice below)   Procedures  Description    None     Electrocautery     Cryotherapy     Balloon Dilatation     Bronchography     Stent Placement   x  Therapeutic Aspiration 20-30 CC's instilled into LUL lingula to obtain BAL samples for cultures    Laser/Argon Plasma    Brachytherapy Catheter Placement    Foreign Body Removal         SPECIMENS (Sites): (Place X beside choice below)  Specimens Description   No Specimens Obtained     Washings   x Lavage Some blood tinged thin secretions noted on BAL, very minimal mucus plugs, cleared with progressive BAL   Biopsies    Fine Needle Aspirates    Brushings    Sputum    FINDINGS: thin, blood tinged secretions, minimal  mucus plugs, AIR WAYS ARE WNL, no edema no inflammation, NO purulent secretions ESTIMATED BLOOD LOSS: none COMPLICATIONS/RESOLUTION: none      IMPRESSION:POST-PROCEDURE DX:   Pneumonia   RECOMMENDATION/PLAN:   Check AFB Check FUNGAL CULTURES    Corrin Parker, M.D.  [facility]  Medical Director [place] Director [place] Department

## 2020-06-06 NOTE — Evaluation (Signed)
Clinical/Bedside Swallow Evaluation Patient Details  Name: Wyatt Ball. MRN: [id number] Date of Birth: 15-Oct-1960  Today's Date: 06/06/2020 Time: SLP Start Time (ACUTE ONLY): 0840 SLP Stop Time (ACUTE ONLY): 0904 SLP Time Calculation (min) (ACUTE ONLY): 24 min  Past Medical History:  Past Medical History:  Diagnosis Date  . Anxiety   . DM (diabetes mellitus) (HCC)   . HTN (hypertension)   . Transverse myelitis [facility])    Past Surgical History:  Past Surgical History:  Procedure Laterality Date  . TONSILLECTOMY    . TOTAL HIP ARTHROPLASTY     HPI:  Wyatt Ball. is a 60 y.o. 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 CT chest abdomen and pelvis without contrast consistent with pneumonia. Multiple nodules left upper lobe likely infectious or inflammatory with follow-up imaging recommended to document resolution. Cardiomegaly with trace pericardial effusion.   Assessment / Plan / Recommendation Clinical Impression  Pt presents with severe encephalopathy c/b decreased attention, inability to problem solve taking food from spoon or drinking from a straw, inability to follow basic simple directions, speaks with either falsetto or no voicing, no awareness of deficits. In addition, at rest, pt's vitals continue to be extremely elevated: RR 38; O2 94%, HR 110 and BP 176/27. When offered trial of water, pt stated "leave me alone." Given pt's current mentation and vitals, recommend pt be NPO. ST to follow for PO readiness. SLP Visit Diagnosis: Dysphagia, unspecified (R13.10)    Aspiration Risk  Severe aspiration risk;Risk for  inadequate nutrition/hydration    Diet Recommendation NPO   Medication Administration: Via alternative means    Other  Recommendations Oral Care Recommendations: Oral care QID   Follow up Recommendations  (TBD)      Frequency and Duration min 2x/week  2 weeks       Prognosis Prognosis for Safe Diet Advancement: Fair Barriers to Reach Goals: Cognitive deficits;Severity of deficits;Behavior      Swallow Study   General Date of Onset: 06/04/20 HPI: Wyatt Ball. is a 60 y.o. 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 CT chest abdomen and pelvis without contrast consistent with pneumonia. Multiple nodules left upper lobe likely infectious or inflammatory with follow-up imaging recommended to document resolution. Cardiomegaly with trace pericardial effusion. Type of Study: Bedside Swallow Evaluation Previous Swallow Assessment: none in chart Diet Prior to this Study: Regular;Thin liquids Temperature Spikes Noted: Yes Respiratory Status: Nasal cannula History of Recent Intubation: No Behavior/Cognition: Confused;Agitated;Distractible;Doesn't follow directions;Uncooperative Oral Cavity Assessment: Dried secretions (extremely poor) Oral Care Completed by SLP:  (pt unwilling) Oral Cavity - Dentition: Poor condition Self-Feeding Abilities: Total assist Patient Positioning: Upright in bed Baseline Vocal Quality:  (falsetto to aphonic) Volitional Cough: Cognitively unable to elicit Volitional Swallow: Unable to elicit    Oral/Motor/Sensory Function Overall Oral Motor/Sensory Function:  (unable to assess)  Ice Chips Ice  chips: Not tested Other Comments:  (pt refused)   Thin Liquid Thin Liquid: Not tested Other Comments:  (pt refused)          Puree Puree: Not tested Other Comments:  (pt refused)      Jameca Chumley B. Dreama Saa M.S., CCC-SLP, [facility] Speech-Language Pathologist Rehabilitation Services Office [phone]         Kerrianne Jeng Dreama Saa 06/06/2020,12:53 PM

## 2020-06-06 NOTE — Progress Notes (Signed)
Patient MEWS score continues to change between yellow and red. Patient alert to self, yells words periodically however no complete sentences. MD informed during start of shift. SBP elevated 180s-190s still. Elevated temps 101-102 axillary. Writer given rectal antipyretic and IV BP medications per MAR. MD aware, charge aware and charge informed RRT of patients status. No new orders given at this time.

## 2020-06-06 NOTE — Progress Notes (Signed)
Patient remains with elevated SBP and tachypneic. RRs 30s-40s still. Labored breathing noted. Saturations 97-98% on 2L acutely.  CNA reported to Clinical research associate that patient coughed up what appeared to look like a blood clot. Charge aware and MD aware, no new orders given at this time.

## 2020-06-06 NOTE — Consult Note (Signed)
Neurology Consultation  Reason for Consult: Fevers, altered mental status Referring Physician: Dr. Enedina Finner  CC: Altered mental status, fevers  History is obtained from: Chart  HPI: Wyatt Ball. is a 60 y.o. male past medical history of diabetes, anxiety, hypertension, prior history of transverse myelitis and ensuing neurogenic bladder, PTSD, history of self-catheterization frequent UTIs, chronic back pain on chronic opiates, patient of the [place] system, brought into the ER on 06/04/2020 with about 3-day history of altered mental status.  His brother is 1 who provided the history over the phone to the admitting doctors.  They said that he was ill with a cough and shortness of breath and fever as well as vomiting and diarrhea and started becoming increasingly confused about a day or so prior to presentation.  Patient was unable to pinpoint any complaints but said everything hurts.  Unable to provide meaningful history. Admitted to the medicine service, and continues to spike fevers.  Being treated for sepsis secondary to community-acquired pneumonia prior to arrival but in spite of treatment still remains altered and no clear source of fevers found. Blood cultures 1 out of 4 bottles with MSSE.  CT abdomen chest revealed dense consolidation and groundglass density in the left upper and lower lobe consistent with pneumonia. Urinalysis unremarkable  Later I was able to speak with the brother Nedra Hai over the phone.  He reports that they live together but he is responsible for his own medications and the brother does not interfere with that.  He has been on the same medications according to the brother for quite some time.  He has been feeling quite depressed but he was not suicidal.  He is going through a separation that has kept him quite depressed.  He goes out of the house only once every 2 to 3 weeks.  Mostly sits on the couch and watches TV.  No history of ethanol or drug abuse.  No SI or  HI.   Of note his medication list includes Flexeril, Cymbalta, Neurontin, Atarax, morphine, oxycodone, Phenergan, trazodone. Alogliptin, aspirin 81, docusate, Mucinex, ibuprofen, lisinopril, loratadine, metformin, metoprolol, omeprazole, prazosin, sildenafil  LKW: Multiple days ago tpa given?: no, outside the window Premorbid modified Rankin scale (mRS): 0  ROS: Full ROS was performed and is negative except as noted in the HPI.  Unable to obtain due to altered mental status.   Past Medical History:  Diagnosis Date  . Anxiety   . DM (diabetes mellitus) (HCC)   . HTN (hypertension)   . Transverse myelitis (HCC)    Family History  Problem Relation Age of Onset  . CAD Mother   . Diabetes Mother   . CAD Father   . Diabetes Father   . CAD Brother   . Diabetes Brother    Social History:   reports that he has been smoking. He has never used smokeless tobacco. He reports that he does not drink alcohol. No history on file for drug use.  Medications  Current Facility-Administered Medications:  .  0.9 %  sodium chloride infusion, , Intravenous, Continuous, Enedina Finner, MD, Last Rate: 100 mL/hr at 06/06/20 0757, Rate Change at 06/06/20 0757 .  acetaminophen (TYLENOL) tablet 650 mg, 650 mg, Oral, Q6H PRN, 650 mg at 06/04/20 2313 **OR** acetaminophen (TYLENOL) suppository 650 mg, 650 mg, Rectal, Q6H PRN, Lewie Chamber, MD, 650 mg at 06/06/20 0728 .  azithromycin (ZITHROMAX) 500 mg in sodium chloride 0.9 % 250 mL IVPB, 500 mg, Intravenous, Q24H, Ronnald Ramp, RPH,  Stopped at 06/06/20 0037 .  cefTRIAXone (ROCEPHIN) 2 g in sodium chloride 0.9 % 100 mL IVPB, 2 g, Intravenous, Q24H, Lewie Chamber, MD, Stopped at 06/06/20 0600 .  Chlorhexidine Gluconate Cloth 2 % PADS 6 each, 6 each, Topical, Daily, Lewie Chamber, MD, 6 each at 06/06/20 1028 .  enoxaparin (LOVENOX) injection 45 mg, 0.5 mg/kg, Subcutaneous, Q24H, Lewie Chamber, MD, 45 mg at 06/05/20 2039 .  hydrALAZINE (APRESOLINE)  injection 10 mg, 10 mg, Intravenous, QID, Enedina Finner, MD .  HYDROcodone-acetaminophen (NORCO/VICODIN) 5-325 MG per tablet 1-2 tablet, 1-2 tablet, Oral, Q4H PRN, Lewie Chamber, MD .  insulin aspart (novoLOG) injection 0-15 Units, 0-15 Units, Subcutaneous, Q4H, Lewie Chamber, MD, 3 Units at 06/06/20 0320 .  labetalol (NORMODYNE) injection 10 mg, 10 mg, Intravenous, Q4H PRN, Lewie Chamber, MD, 10 mg at 06/06/20 0728 .  ondansetron (ZOFRAN) tablet 4 mg, 4 mg, Oral, Q6H PRN **OR** ondansetron (ZOFRAN) injection 4 mg, 4 mg, Intravenous, Q6H PRN, Lewie Chamber, MD .  potassium chloride SA (KLOR-CON) CR tablet 40 mEq, 40 mEq, Oral, Once, Ronnald Ramp, RPH   Exam: Current vital signs: BP (!) 179/86   Pulse 86   Temp 99.1 F (37.3 C)   Resp (!) 30   Ht 5\' 6"  (1.676 m)   Wt 95.6 kg   SpO2 96%   BMI 34.02 kg/m  Vital signs in last 24 hours: Temp:  [97.4 F (36.3 C)-103.2 F (39.6 C)] 99.1 F (37.3 C) (06/02 1100) Pulse Rate:  [68-108] 86 (06/02 1400) Resp:  [20-48] 30 (06/02 1400) BP: (157-195)/(72-146) 179/86 (06/02 1400) SpO2:  [92 %-99 %] 96 % (06/02 1400) Weight:  [95.6 kg] 95.6 kg (06/02 0900) General: Awake, diaphoretic HEENT: Normocephalic, atraumatic, extremely dry oral mucous membranes and dry tongue with crusting on the lips and on the tongue. CVS: Regular rhythm Respiratory: Breathing well saturating normally on supplemental oxygen Extremities: Warm well perfused without edema Neurological exam Is awake, alert, has poor attention concentration. Extremely dysarthric-unclear if it is true dysarthria or bad crusting in his mouth- he has extremely crusted lips and mouth at this time. No evidence of aphasia-was able to name simple objects. Was able to follow commands albeit very slowly. cranial nerves II to XII intact Motor examination: Antigravity in both upper extremities as well as in both lower extremities with coaching.  Normal tone.  No rigidity Sensation intact to  light touch all over Coordination: Difficult to assess due to his cooperation and slow movements DTRs: Absent in all 4 extremities.  No clonus.  Labs I have reviewed labs in epic and the results pertinent to this consultation are:   CBC    Component Value Date/Time   WBC 12.3 (H) 06/05/2020 0716   RBC 5.00 06/05/2020 0716   HGB 14.6 06/05/2020 0716   HCT 43.1 06/05/2020 0716   PLT 132 (L) 06/05/2020 0716   MCV 86.2 06/05/2020 0716   MCH 29.2 06/05/2020 0716   MCHC 33.9 06/05/2020 0716   RDW 14.1 06/05/2020 0716    CMP     Component Value Date/Time   NA 133 (L) 06/05/2020 0838   K 3.2 (L) 06/05/2020 0838   CL 100 06/05/2020 0838   CO2 18 (L) 06/05/2020 0838   GLUCOSE 240 (H) 06/05/2020 0838   BUN 21 06/05/2020 0838   CREATININE 1.06 06/05/2020 0838   CALCIUM 8.3 (L) 06/05/2020 0838   PROT 6.9 06/04/2020 1718   ALBUMIN 3.4 (L) 06/04/2020 1718   AST 14 (L) 06/04/2020 1718  ALT 11 06/04/2020 1718   ALKPHOS 61 06/04/2020 1718   BILITOT 1.0 06/04/2020 1718   GFRNONAA >60 06/05/2020 0454   Urine toxicology screen positive for opiates and tricyclic's.  He is not on any tricyclic antidepressants but Flexeril can sometimes turn out positive on urinary toxicology screens as TCAs as false positives.   Imaging I have reviewed the images obtained:  CT-scan of the brain-06/04/2020 with no acute intracranial pathology.  15 mm exophytic thyroid/parathyroid nodule on the right.  Further evaluation with ultrasound on a nonemergent basis.   Assessment: 60 year old with past history of diabetes, anxiety, hypertension, prior history of transverse myelitis with ensuing neurogenic bladder, PTSD history of self-catheterization frequent UTIs chronic back pain on chronic opiates brought into the ER on 06/04/2020 with 3-day history of not feeling well and possible fevers.  Continues to have fevers-chest x-ray did reveal pneumonia, he is on treatment. Remains more altered from what would be  expected at this time. Systemic infection/pneumonia definitely remain on top of the differentials but thinking outside the box--- could have neuroleptic malignant syndrome caused by promethazine. Other differentials include strokes.  Seizures less likely. I would pursue an LP for a CNS infection only if there is no other clear source-he has no meningitic signs at this time. Also to be kept in mind is that he has an extensive psychiatric  history-which might be contributing to the current presentation as well but looking for reversible causes other than psychiatry causes first would be prudent.   Recommendations: Check CK Check MRI brain w/o contrast to r/o stroke Low suspicion for CNS infection- we will consider recommending an LP if all else remains unremarkable. Would be helpful to get records including psychiatry records from the [place] system.  Discussed with Dr. Allena Katz.  -- Milon Dikes, MD Neurologist [facility] Pager: [phone]

## 2020-06-06 NOTE — Progress Notes (Signed)
Patient intubated with size 8 ETT. 25cm at lip by respiratory therapist Carollee Herter.  Dr. Belia Heman at bedside. Equal bilateral breath sounds, condensation noted in ETT, positive color change on c02 detector.  ETT secured with commercial holder.

## 2020-06-07 ENCOUNTER — Inpatient Hospital Stay: Payer: Self-pay

## 2020-06-07 ENCOUNTER — Inpatient Hospital Stay: Payer: No Typology Code available for payment source

## 2020-06-07 DIAGNOSIS — A419 Sepsis, unspecified organism: Secondary | ICD-10-CM | POA: Diagnosis not present

## 2020-06-07 DIAGNOSIS — G8929 Other chronic pain: Secondary | ICD-10-CM

## 2020-06-07 DIAGNOSIS — N390 Urinary tract infection, site not specified: Secondary | ICD-10-CM

## 2020-06-07 DIAGNOSIS — R4182 Altered mental status, unspecified: Secondary | ICD-10-CM

## 2020-06-07 DIAGNOSIS — M545 Low back pain, unspecified: Secondary | ICD-10-CM

## 2020-06-07 DIAGNOSIS — N329 Bladder disorder, unspecified: Secondary | ICD-10-CM | POA: Diagnosis not present

## 2020-06-07 DIAGNOSIS — E119 Type 2 diabetes mellitus without complications: Secondary | ICD-10-CM

## 2020-06-07 DIAGNOSIS — J189 Pneumonia, unspecified organism: Secondary | ICD-10-CM

## 2020-06-07 DIAGNOSIS — F1190 Opioid use, unspecified, uncomplicated: Secondary | ICD-10-CM

## 2020-06-07 DIAGNOSIS — G43009 Migraine without aura, not intractable, without status migrainosus: Secondary | ICD-10-CM

## 2020-06-07 DIAGNOSIS — G9341 Metabolic encephalopathy: Secondary | ICD-10-CM

## 2020-06-07 LAB — STREP PNEUMONIAE URINARY ANTIGEN: Strep Pneumo Urinary Antigen: NEGATIVE

## 2020-06-07 LAB — BASIC METABOLIC PANEL
Anion gap: 9 (ref 5–15)
BUN: 29 mg/dL — ABNORMAL HIGH (ref 8–23)
CO2: 23 mmol/L (ref 22–32)
Calcium: 8.3 mg/dL — ABNORMAL LOW (ref 8.9–10.3)
Chloride: 111 mmol/L (ref 98–111)
Creatinine, Ser: 1.38 mg/dL — ABNORMAL HIGH (ref 0.61–1.24)
GFR, Estimated: 58 mL/min — ABNORMAL LOW (ref >60)
Glucose, Bld: 153 mg/dL — ABNORMAL HIGH (ref 70–99)
Potassium: 2.8 mmol/L — ABNORMAL LOW (ref 3.5–5.1)
Sodium: 143 mmol/L (ref 135–145)

## 2020-06-07 LAB — GLUCOSE, CAPILLARY
Glucose-Capillary: 123 mg/dL — ABNORMAL HIGH (ref 70–99)
Glucose-Capillary: 146 mg/dL — ABNORMAL HIGH (ref 70–99)
Glucose-Capillary: 152 mg/dL — ABNORMAL HIGH (ref 70–99)
Glucose-Capillary: 160 mg/dL — ABNORMAL HIGH (ref 70–99)
Glucose-Capillary: 169 mg/dL — ABNORMAL HIGH (ref 70–99)
Glucose-Capillary: 206 mg/dL — ABNORMAL HIGH (ref 70–99)
Glucose-Capillary: 207 mg/dL — ABNORMAL HIGH (ref 70–99)

## 2020-06-07 LAB — CBC
HCT: 37.2 % — ABNORMAL LOW (ref 39.0–52.0)
Hemoglobin: 12.8 g/dL — ABNORMAL LOW (ref 13.0–17.0)
MCH: 29.2 pg (ref 26.0–34.0)
MCHC: 34.4 g/dL (ref 30.0–36.0)
MCV: 84.7 fL (ref 80.0–100.0)
Platelets: 150 10*3/uL (ref 150–400)
RBC: 4.39 MIL/uL (ref 4.22–5.81)
RDW: 15 % (ref 11.5–15.5)
WBC: 7.7 10*3/uL (ref 4.0–10.5)
nRBC: 0 % (ref 0.0–0.2)

## 2020-06-07 LAB — TRIGLYCERIDES: Triglycerides: 192 mg/dL — ABNORMAL HIGH (ref <150)

## 2020-06-07 LAB — PHOSPHORUS: Phosphorus: 2.7 mg/dL (ref 2.5–4.6)

## 2020-06-07 LAB — MAGNESIUM: Magnesium: 2.2 mg/dL (ref 1.7–2.4)

## 2020-06-07 LAB — POTASSIUM: Potassium: 3.3 mmol/L — ABNORMAL LOW (ref 3.5–5.1)

## 2020-06-07 IMAGING — DX DG CHEST 1V PORT
1 series · 1 of 1 positions shown · non-contrast
Comparison: [DATE]

CLINICAL DATA: Status post intubation and OG placement

EXAM:
PORTABLE CHEST 1 VIEW

[chest ap]
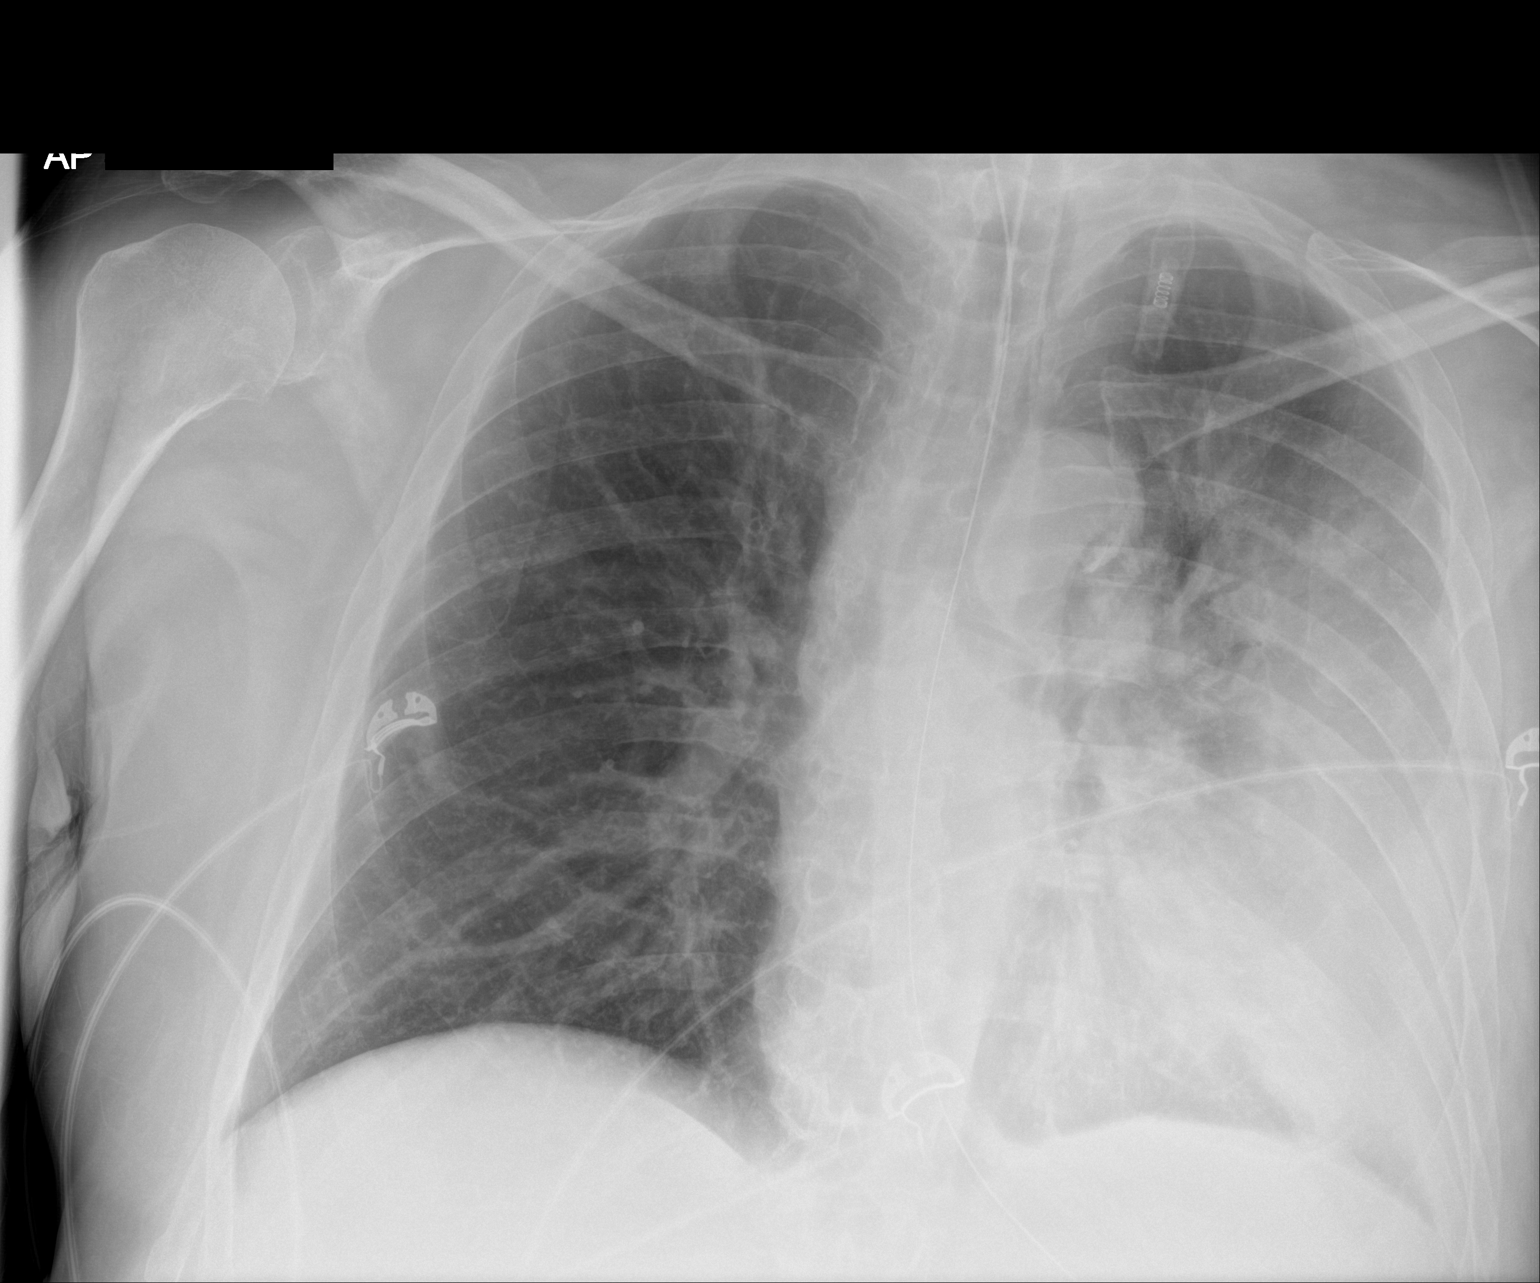

[1 of 1 positions shown; findings below may reference images not displayed]

FINDINGS: Endotracheal tube remains in good position. Gastric tube enters the
stomach.

Dense consolidation in the left lower lobe is unchanged. Small left
effusion. Right lung clear.
IMPRESSION: Endotracheal tube in good position

Dense consolidation left lower lobe unchanged

## 2020-06-07 IMAGING — MR MR HEAD WO/W CM
14 series · 48 of 48 positions shown · IV contrast (gadavist)
Comparison: Prior head CT examinations [DATE] and earlier.

CLINICAL DATA: Provided history: Neuro deficit, acute, stroke
suspected. Additional history provided: 3 days of altered mental
status, cough, shortness of breath and fever with vomiting and
diarrhea, increasing confusion.

EXAM:
MRI HEAD WITHOUT AND WITH CONTRAST
TECHNIQUE: Multiplanar, multiecho pulse sequences of the brain and surrounding
structures were obtained without and with intravenous contrast.
CONTRAST:  10mL GADAVIST GADOBUTROL 1 MMOL/ML IV SOLN

[Series 5: ax dwi_tracew · axial · 3.0mm · 0.65mm/px · z∈[-23,+114]mm · 3 of 48 slices shown]
[im 1/48]
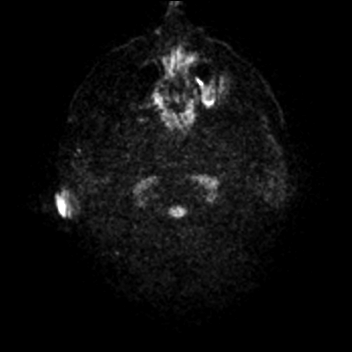
[im 24/48]
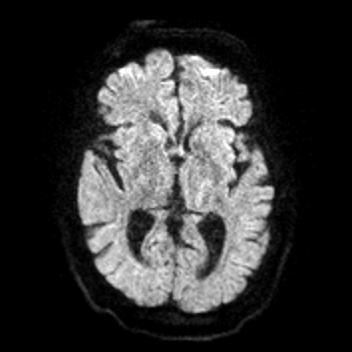
[im 48/48]
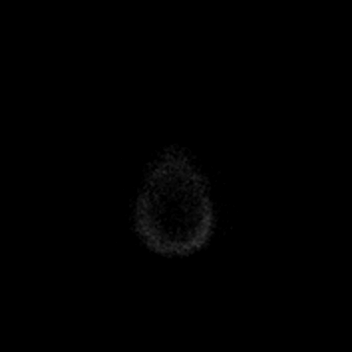

[Series 6: ax dwi_adc · axial · 3.0mm · 0.65mm/px · z∈[-23,+114]mm · 3 of 48 slices shown]
[im 1/48]
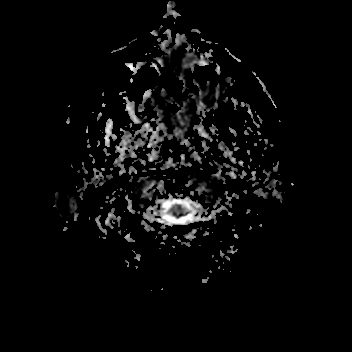
[im 24/48]
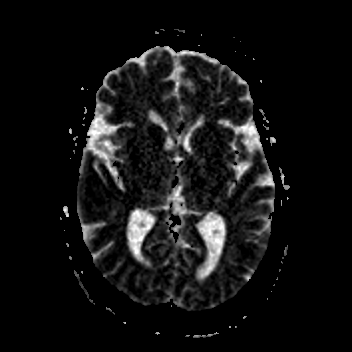
[im 48/48]
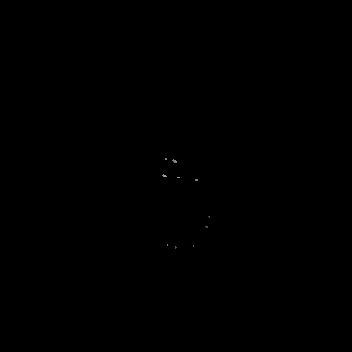

[Series 7: cor dwi_tracew · coronal · 5.0mm · 1.80mm/px · 2 of 41 slices shown]
[im 1/41]
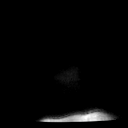
[im 41/41]
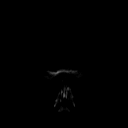

[Series 8: cor dwi_adc · coronal · 5.0mm · 1.80mm/px · 2 of 41 slices shown]
[im 1/41]
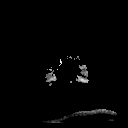
[im 41/41]
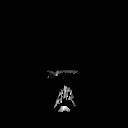

[Series 9: T1 · sagittal · 5.0mm · 0.62mm/px · 1 of 23 slices shown (1 of 2)]
[im 1/23]
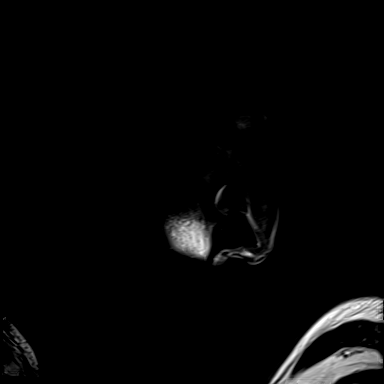

[Series 10: T2 · axial · 5.0mm · 0.86mm/px · z∈[-26,+112]mm · 2 of 27 slices shown (1 of 2)]
[im 1/27]
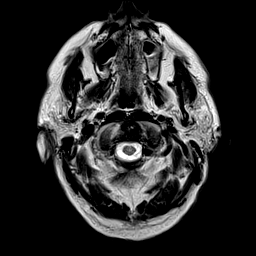
[im 27/27]
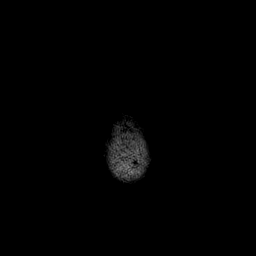

[Series 12: pha_images · axial · 3.0mm · 0.90mm/px · z∈[-20,+112]mm · 3 of 51 slices shown]
[im 1/51]
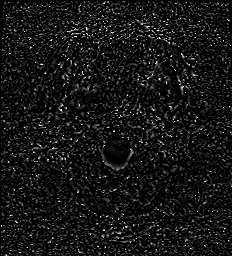
[im 26/51]
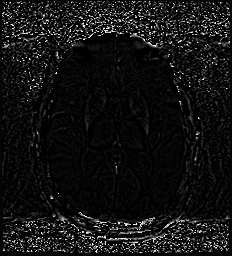
[im 51/51]
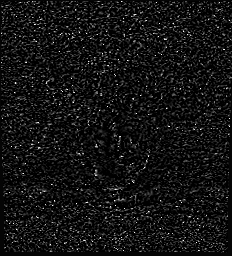

[Series 13: swi_images · axial · 3.0mm · 0.90mm/px · z∈[-23,+112]mm · 3 of 52 slices shown]
[im 1/52]
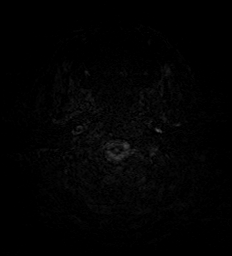
[im 26/52]
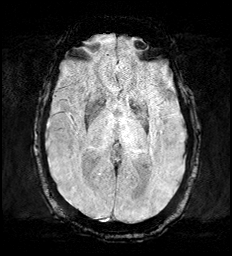
[im 52/52]
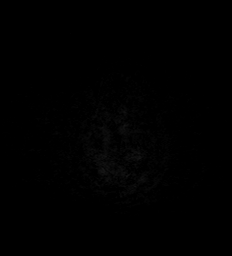

[Series 15: FLAIR · axial · 5.0mm · 0.90mm/px · z∈[-23,+114]mm · 2 of 27 slices shown]
[im 1/27]
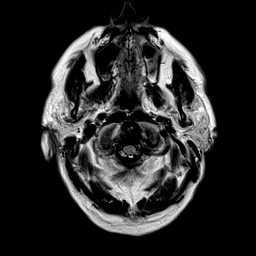
[im 27/27]
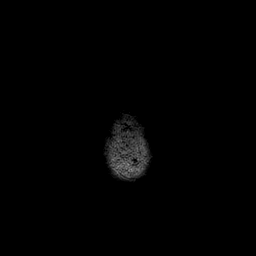

[Series 16: T1 · axial · 1.0mm · 0.98mm/px · z∈[-21,+133]mm · 11 of 176 slices shown (2 of 2)]
[im 1/176]
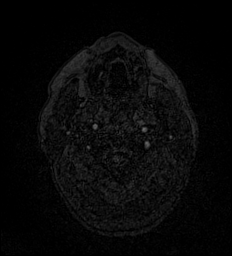
[im 18/176]
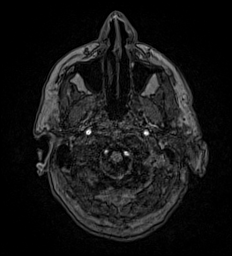
[im 36/176]
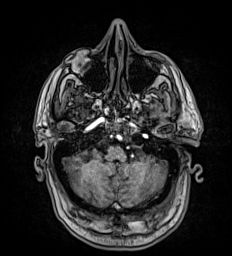
[im 53/176]
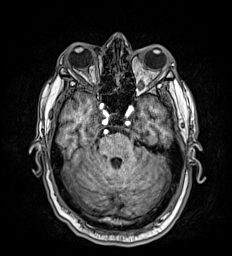
[im 71/176]
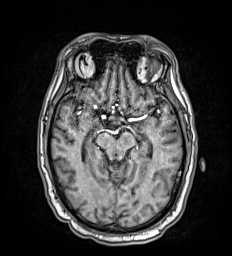
[im 88/176]
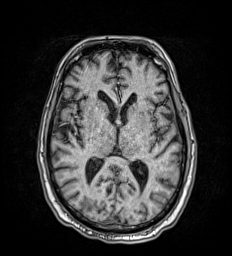
[im 106/176]
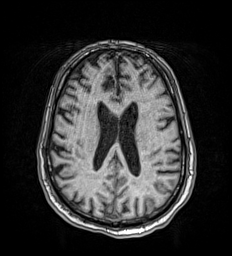
[im 123/176]
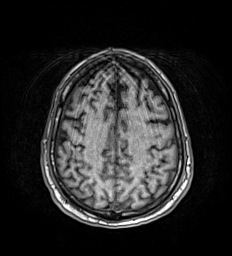
[im 141/176]
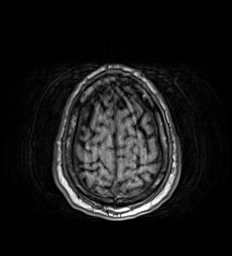
[im 158/176]
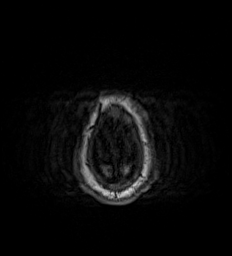
[im 176/176]
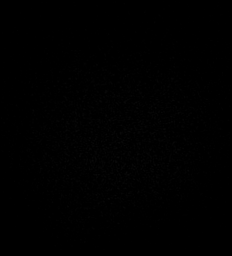

[Series 17: T2 · coronal · 5.0mm · 0.86mm/px · 2 of 31 slices shown (2 of 2)]
[im 1/31]
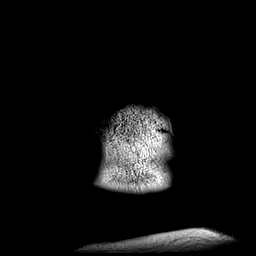
[im 31/31]
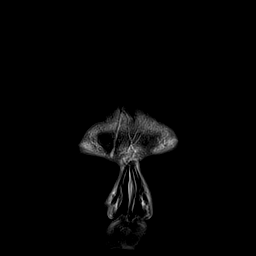

[Series 18: T1 post-contrast · axial · 1.0mm · 0.98mm/px · z∈[-21,+133]mm · 11 of 176 slices shown (1 of 3)]
[im 1/176]
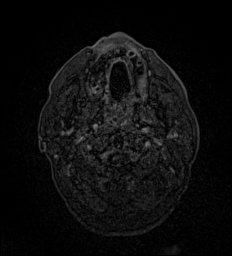
[im 18/176]
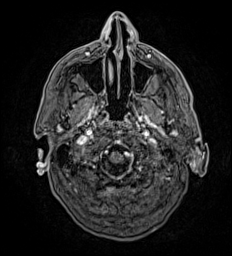
[im 36/176]
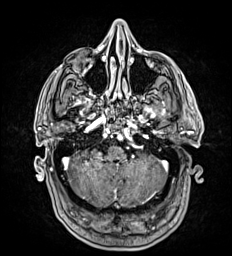
[im 53/176]
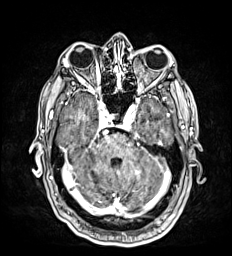
[im 71/176]
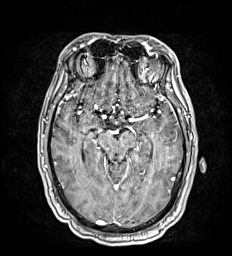
[im 88/176]
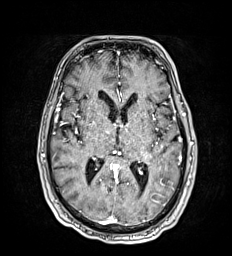
[im 106/176]
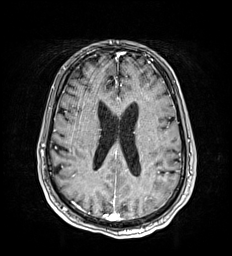
[im 123/176]
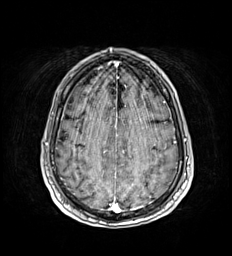
[im 141/176]
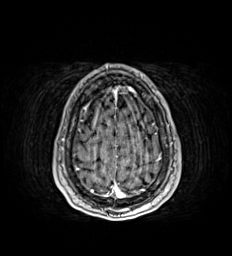
[im 158/176]
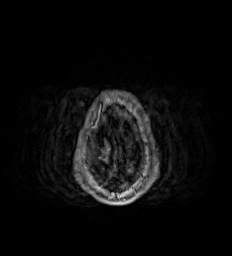
[im 176/176]
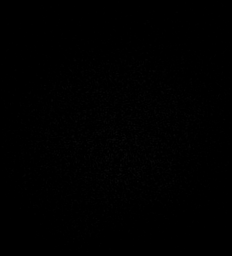

[Series 19: T1 post-contrast · coronal · 5.0mm · 0.86mm/px · 2 of 31 slices shown (2 of 3)]
[im 1/31]
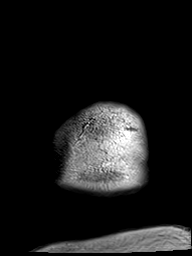
[im 31/31]
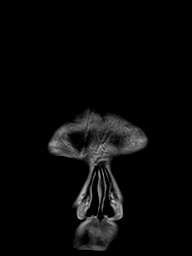

[Series 20: T1 post-contrast · sagittal · 5.0mm · 0.62mm/px · 1 of 23 slices shown (3 of 3)]
[im 1/23]
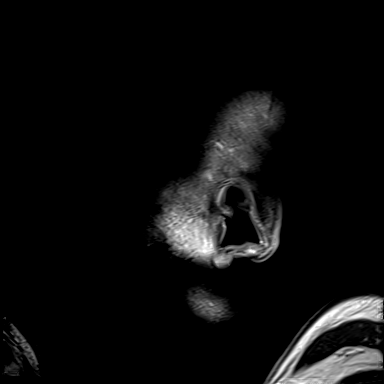

[48 of 48 positions shown; findings below may reference images not displayed]

FINDINGS: Brain:

Intermittently motion degraded examination, limiting evaluation.
Most notably, there is mild motion degradation of the axial
diffusion-weighted sequence, moderate motion degradation of the
sagittal T1 weighted sequence, mild-to-moderate motion degradation
of the axial T1 postcontrast sequence, moderate motion degradation
of the coronal T1 postcontrast sequence and moderate/severe motion
degradation of the sagittal T1 postcontrast sequence.

Mild-to-moderate generalized parenchymal atrophy.

No cortical encephalomalacia is identified.

No significant white matter disease.

There is no acute infarct.

No evidence of intracranial mass.

No chronic intracranial blood products.

No extra-axial fluid collection.

No midline shift.

No abnormal intracranial enhancement.

Vascular: Expected proximal arterial flow voids.

Skull and upper cervical spine: No focal marrow lesion is identified

Sinuses/Orbits: Visualized orbits show no acute finding. Mild
bilateral ethmoid, sphenoid and maxillary sinus mucosal thickening.
IMPRESSION: Motion degraded examination, as described and limiting evaluation.

No evidence of acute intracranial abnormality.

Mild-to-moderate generalized parenchymal atrophy.

Mild paranasal sinus mucosal thickening.

## 2020-06-07 IMAGING — CT CT ANGIO CHEST
2 of 6 series · 18 of 46 positions shown · IV contrast (omnipaque)
Comparison: Single-view of the chest [DATE], [DATE] and
[DATE]. CT chest, abdomen and pelvis [DATE].

CLINICAL DATA: Acute respiratory failure.  Pneumonia.

EXAM:
CT ANGIOGRAPHY CHEST WITH CONTRAST
TECHNIQUE: Multidetector CT imaging of the chest was performed using the
standard protocol during bolus administration of intravenous
contrast. Multiplanar CT image reconstructions and MIPs were
obtained to evaluate the vascular anatomy.
CONTRAST:  75 mL OMNIPAQUE IOHEXOL 350 MG/ML SOLN

[Series 5: thins · axial · 0.71mm/px · z∈[-942,-703]mm · 16 of 263 slices shown]
[im 12/263  lung]
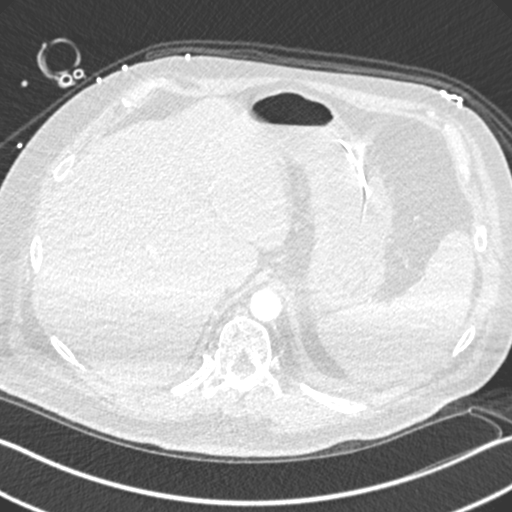
[im 35/263  soft-tissue]
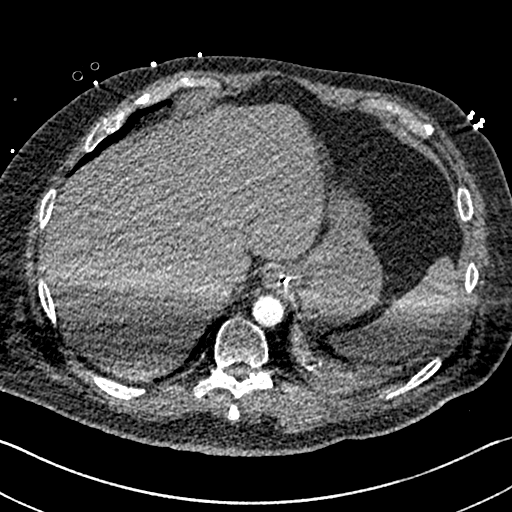
[im 46/263  lung]
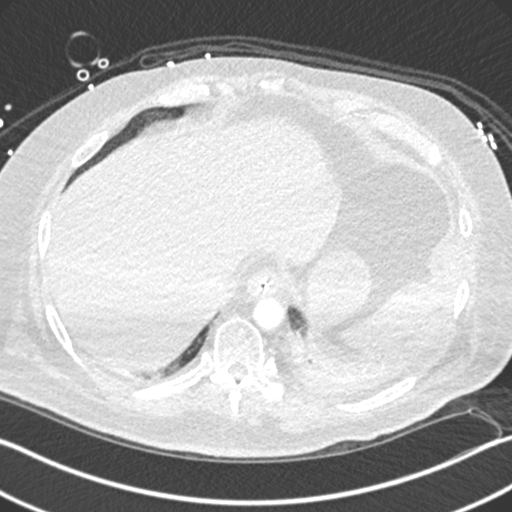
[im 57/263  soft-tissue]
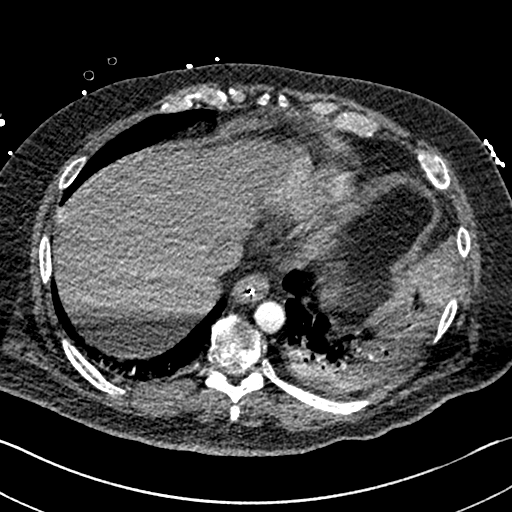
[im 80/263  lung]
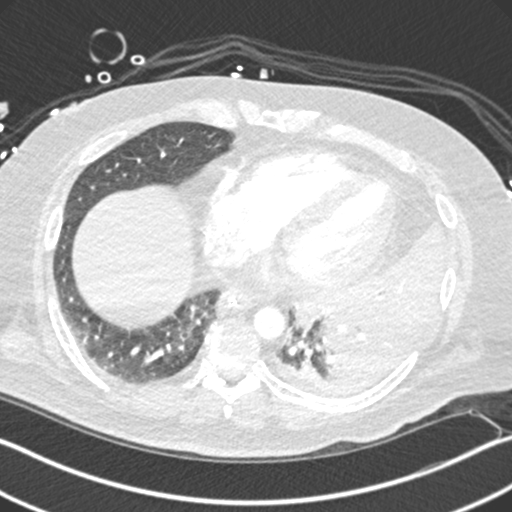
[im 92/263  soft-tissue]
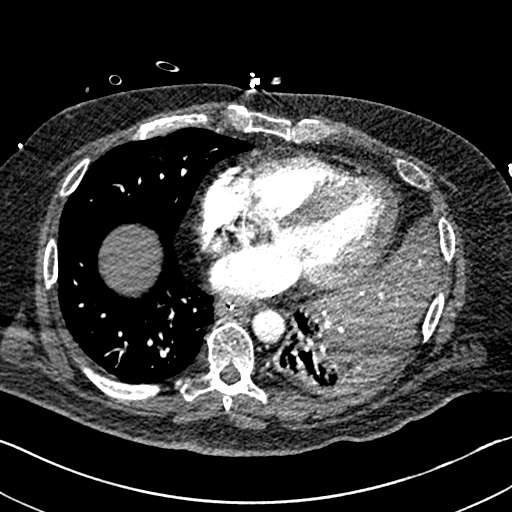
[im 103/263  lung]
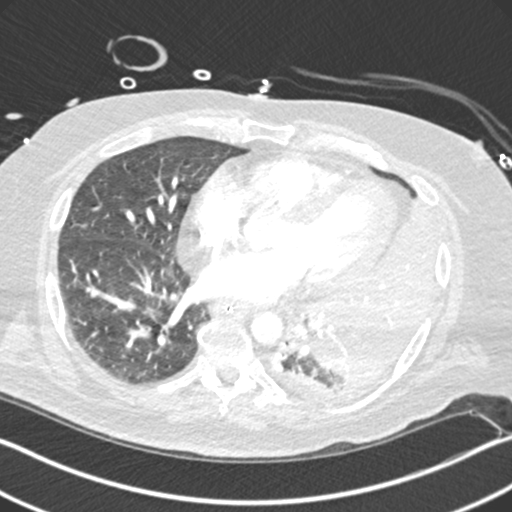
[im 126/263  soft-tissue]
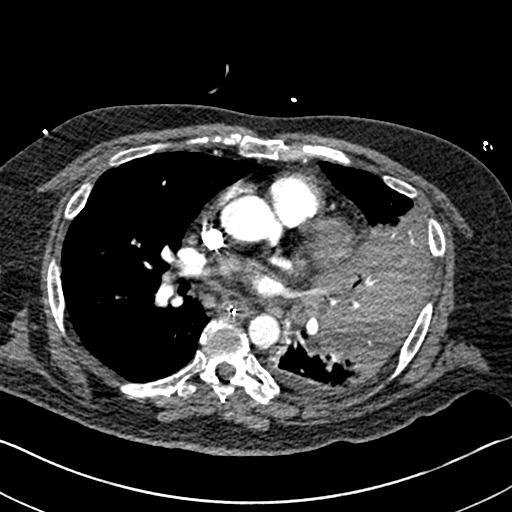
[im 137/263  lung]
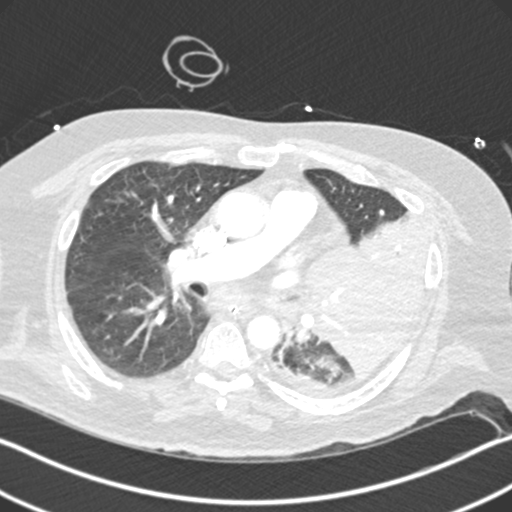
[im 160/263  soft-tissue]
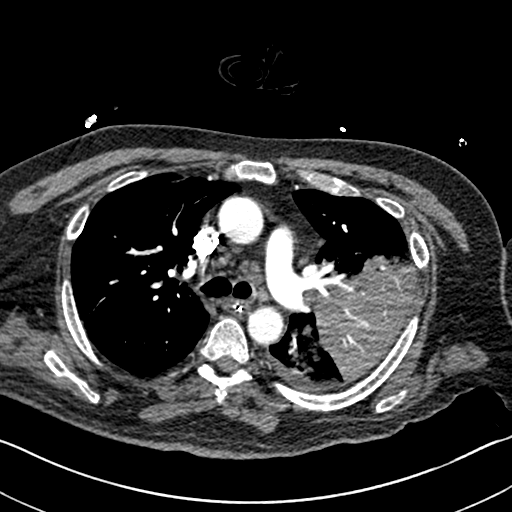
[im 171/263  lung]
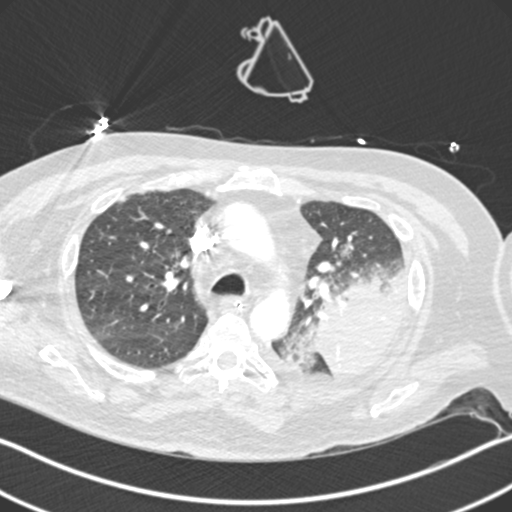
[im 183/263  soft-tissue]
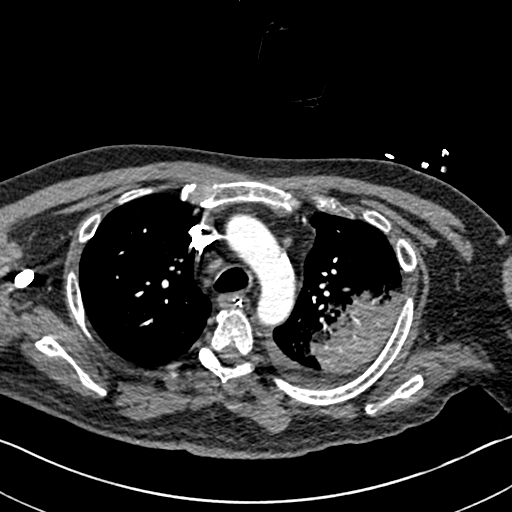
[im 206/263  lung]
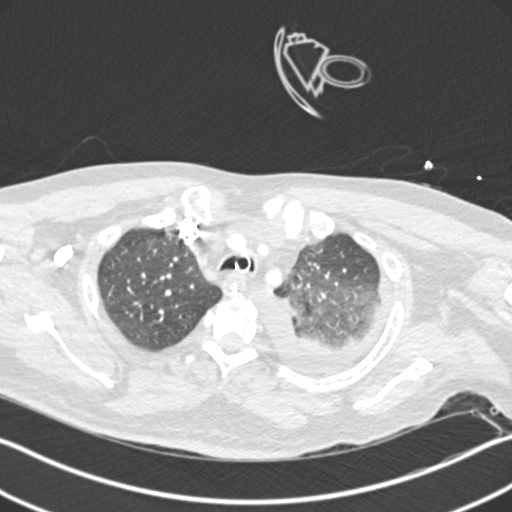
[im 217/263  soft-tissue]
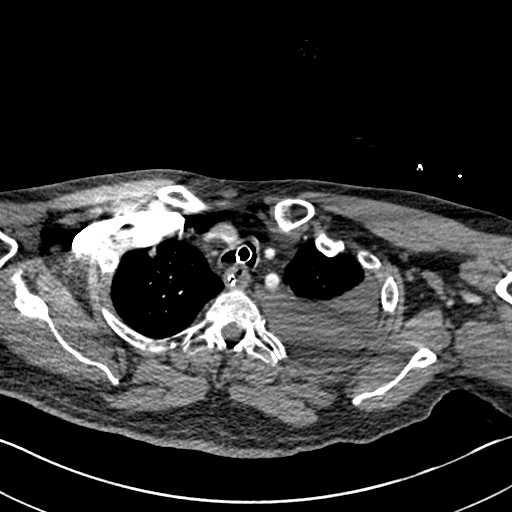
[im 228/263  lung]
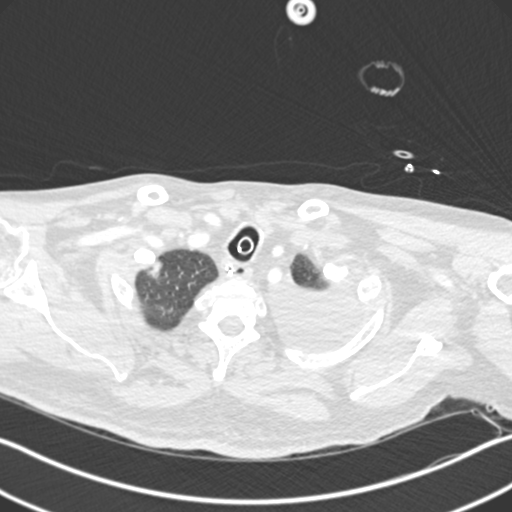
[im 251/263  soft-tissue]
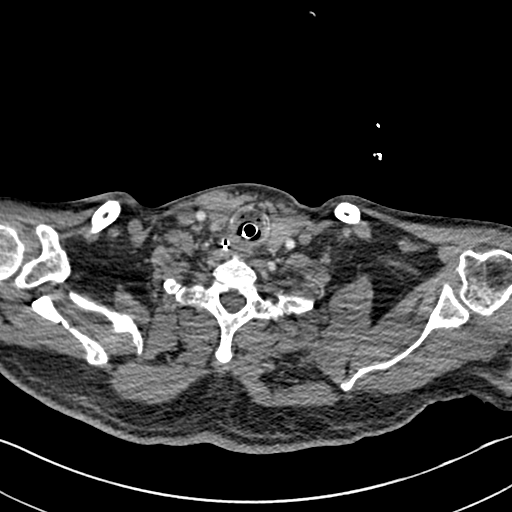

[Series 7: coronal mpr · coronal · 0.52mm/px · 2 of 92 slices shown]
[im 31/92  soft-tissue]
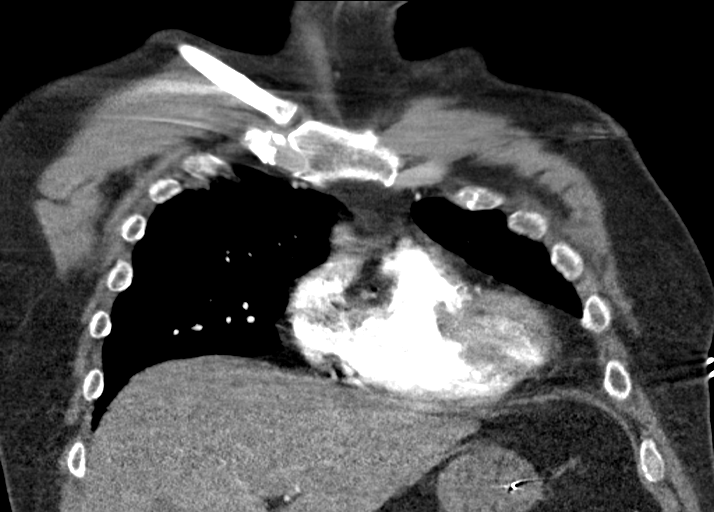
[im 61/92  soft-tissue]
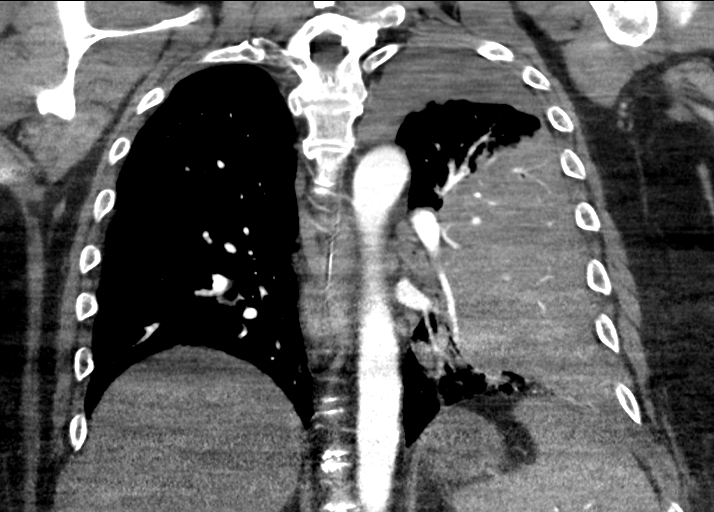

[18 of 46 positions shown; findings below may reference images not displayed]

FINDINGS: Cardiovascular: No pulmonary embolus. There is cardiomegaly. No
aortic aneurysm. Calcific aortic atherosclerosis noted.

Mediastinum/Nodes: No enlarged mediastinal, hilar, or axillary lymph
nodes. Thyroid gland, trachea, and esophagus demonstrate no
significant findings.

Lungs/Pleura: Small to moderate left apical pleural effusion is
seen. Dense airspace disease is present in the left upper and lower
lobes. The appearance is much worse than on the prior CT. Small
nodular densities in the left upper lobe seen on the prior CT are
not visible today and likely obscured by pleural fluid and
consolidation. Minimal dependent atelectasis on the left.

Upper Abdomen: No acute or focal abnormality.

Musculoskeletal: Negative.

Review of the MIP images confirms the above findings.
IMPRESSION: Negative for pulmonary embolus.

Marked worsening of airspace disease in the left chest most
consistent with progressive pneumonia. New small to moderate left
pleural effusion is present.

Cardiomegaly.

Aortic Atherosclerosis ([Y7]-[Y7]).

## 2020-06-07 IMAGING — DX DG ABDOMEN 1V
1 series · 1 of 1 positions shown · non-contrast
Comparison: [DATE]

CLINICAL DATA: OG placement

EXAM:
ABDOMEN - 1 VIEW

[abdomen supine]
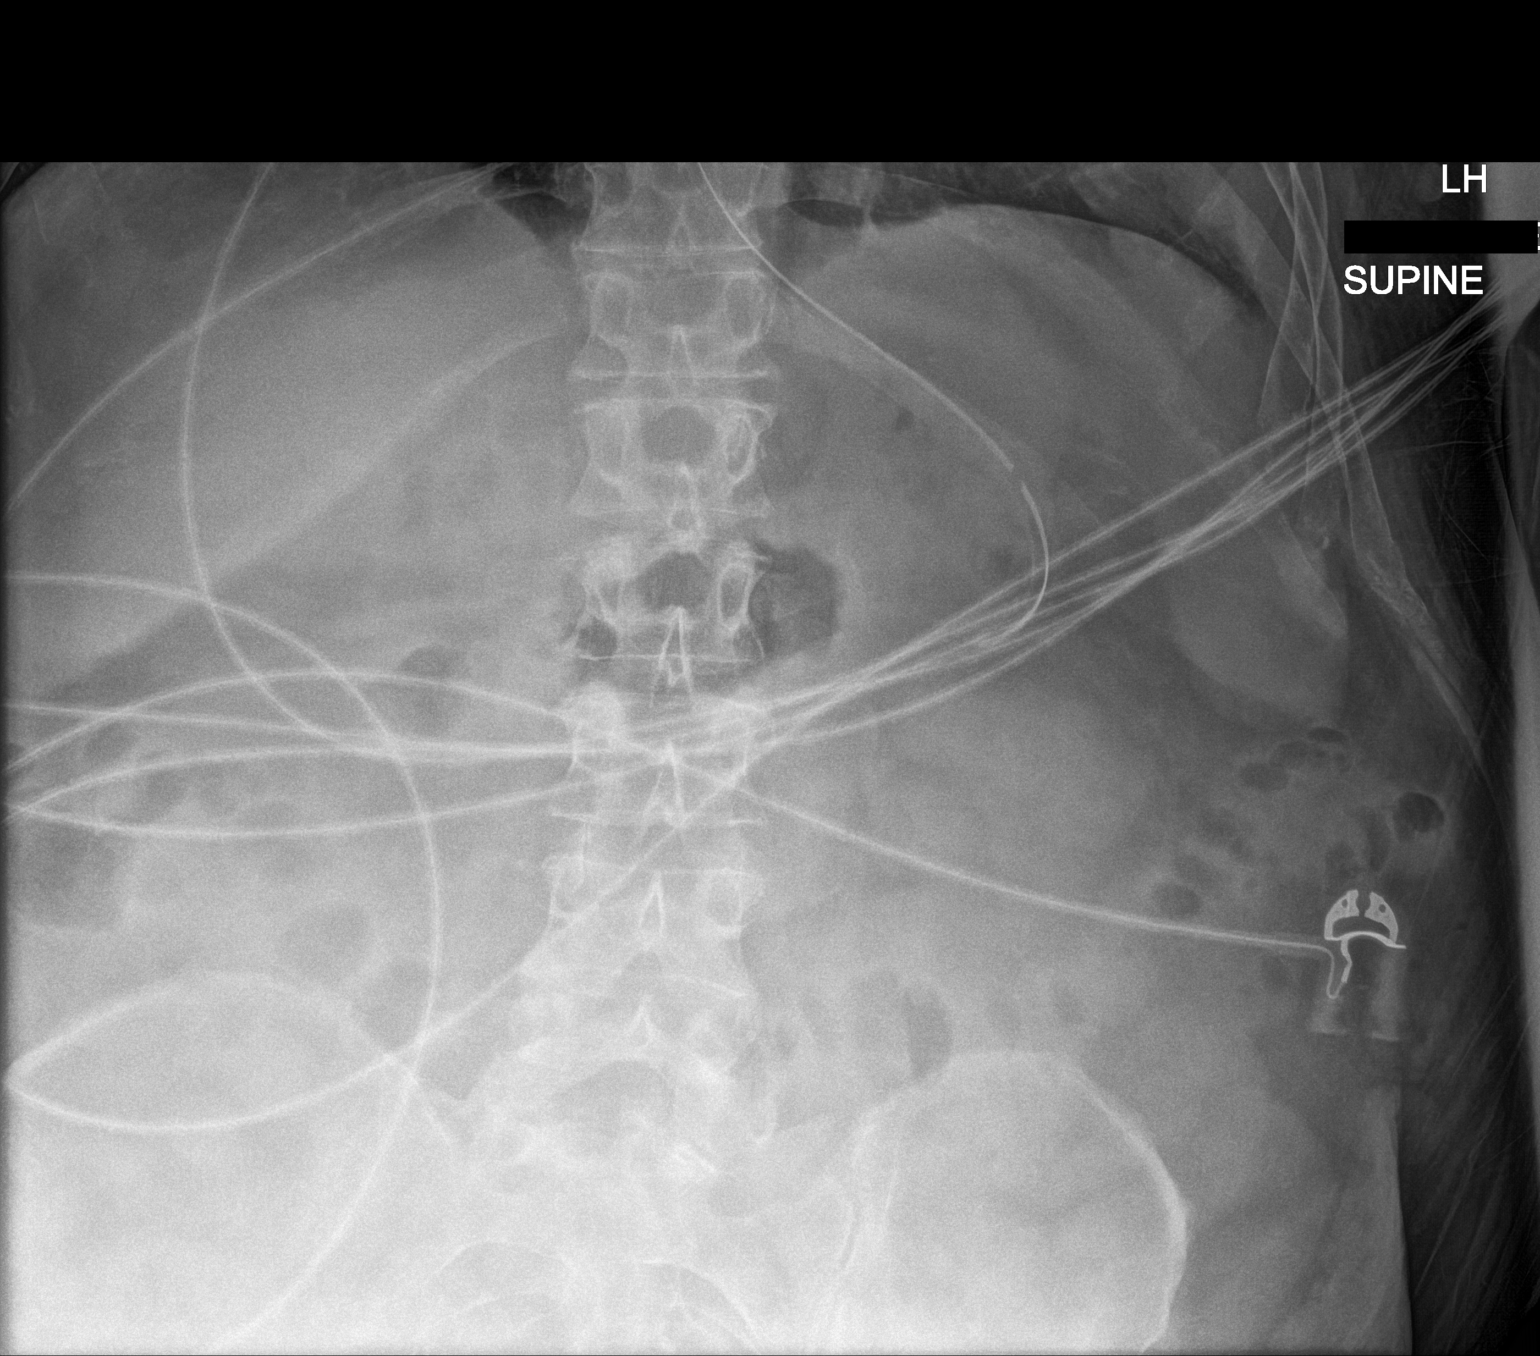

[1 of 1 positions shown; findings below may reference images not displayed]

FINDINGS: Gastric tube enters the stomach with the tip near the antrum. Normal
bowel gas pattern. No significant change from yesterday.
IMPRESSION: Gastric tube tip in the antrum.  Normal bowel gas pattern.

## 2020-06-07 MED ORDER — CHLORHEXIDINE GLUCONATE 0.12% ORAL RINSE (MEDLINE KIT)
15.0000 mL | Freq: Two times a day (BID) | OROMUCOSAL | Status: DC
  Administered 2020-06-07 – 2020-07-30 (×101): 15 mL via OROMUCOSAL

## 2020-06-07 MED ORDER — LACTATED RINGERS IV SOLN: INTRAVENOUS | Status: DC

## 2020-06-07 MED ORDER — POTASSIUM CHLORIDE 20 MEQ PO PACK
40.0000 meq | PACK | Freq: Once | ORAL | Status: AC
  Administered 2020-06-07: 40 meq via ORAL
  Filled 2020-06-07: qty 2

## 2020-06-07 MED ORDER — ROCURONIUM BROMIDE 50 MG/5ML IV SOLN: 50.0000 mg | Freq: Once | INTRAVENOUS | Status: AC

## 2020-06-07 MED ORDER — IBUPROFEN 100 MG/5ML PO SUSP
400.0000 mg | Freq: Three times a day (TID) | ORAL | Status: DC | PRN
  Administered 2020-06-07: 400 mg
  Filled 2020-06-07 (×5): qty 20

## 2020-06-07 MED ORDER — LACTATED RINGERS IV BOLUS
1000.0000 mL | Freq: Once | INTRAVENOUS | Status: AC
  Administered 2020-06-07: 1000 mL via INTRAVENOUS

## 2020-06-07 MED ORDER — SODIUM CHLORIDE 0.9 % IV SOLN
500.0000 mg | INTRAVENOUS | Status: AC
  Administered 2020-06-07 – 2020-06-11 (×5): 500 mg via INTRAVENOUS
  Filled 2020-06-07 (×5): qty 500

## 2020-06-07 MED ORDER — FREE WATER
100.0000 mL | Status: DC
  Administered 2020-06-07 – 2020-06-19 (×71): 100 mL

## 2020-06-07 MED ORDER — LABETALOL HCL 5 MG/ML IV SOLN
10.0000 mg | Freq: Once | INTRAVENOUS | Status: AC
  Administered 2020-06-07: 10 mg via INTRAVENOUS

## 2020-06-07 MED ORDER — SODIUM CHLORIDE 0.9% FLUSH
10.0000 mL | Freq: Two times a day (BID) | INTRAVENOUS | Status: DC
  Administered 2020-06-07 – 2020-06-10 (×6): 10 mL
  Administered 2020-06-10 – 2020-06-11 (×2): 30 mL
  Administered 2020-06-11 – 2020-06-19 (×17): 10 mL
  Administered 2020-06-20: 30 mL
  Administered 2020-06-20 – 2020-06-26 (×12): 10 mL
  Administered 2020-06-27: 30 mL
  Administered 2020-06-27 (×2): 10 mL
  Administered 2020-06-28: 30 mL
  Administered 2020-06-28 – 2020-07-01 (×5): 10 mL
  Administered 2020-07-01 (×2): 40 mL
  Administered 2020-07-02 – 2020-07-10 (×18): 10 mL
  Administered 2020-07-11: 30 mL
  Administered 2020-07-11: 10 mL
  Administered 2020-07-12: 22:00:00 30 mL
  Administered 2020-07-12: 14:00:00 10 mL
  Administered 2020-07-13 (×2): 30 mL
  Administered 2020-07-14: 21:00:00 10 mL
  Administered 2020-07-14: 09:00:00 30 mL
  Administered 2020-07-15 – 2020-07-18 (×7): 10 mL
  Administered 2020-07-18: 08:00:00 40 mL
  Administered 2020-07-19: 21:00:00 10 mL
  Administered 2020-07-19: 30 mL
  Administered 2020-07-21: 11:00:00 10 mL

## 2020-06-07 MED ORDER — POTASSIUM CHLORIDE 20 MEQ PO PACK
20.0000 meq | PACK | Freq: Once | ORAL | Status: AC
  Administered 2020-06-07: 20 meq
  Filled 2020-06-07: qty 1

## 2020-06-07 MED ORDER — ETOMIDATE 2 MG/ML IV SOLN
20.0000 mg | Freq: Once | INTRAVENOUS | Status: AC
  Administered 2020-06-07: 20 mg via INTRAVENOUS
  Filled 2020-06-07: qty 10

## 2020-06-07 MED ORDER — SODIUM CHLORIDE 0.9 % IV SOLN
2.0000 g | Freq: Three times a day (TID) | INTRAVENOUS | Status: DC
  Filled 2020-06-07 (×2): qty 2

## 2020-06-07 MED ORDER — TRAZODONE HCL 50 MG PO TABS
150.0000 mg | ORAL_TABLET | Freq: Every day | ORAL | Status: DC
  Administered 2020-06-07 – 2020-06-18 (×12): 150 mg
  Filled 2020-06-07 (×11): qty 1

## 2020-06-07 MED ORDER — PROSOURCE TF PO LIQD
90.0000 mL | Freq: Four times a day (QID) | ORAL | Status: DC
  Administered 2020-06-07 – 2020-06-10 (×12): 90 mL
  Filled 2020-06-07 (×15): qty 90

## 2020-06-07 MED ORDER — PRAZOSIN HCL 2 MG PO CAPS
4.0000 mg | ORAL_CAPSULE | Freq: Every day | ORAL | Status: DC
  Administered 2020-06-07 – 2020-06-13 (×7): 4 mg
  Filled 2020-06-07 (×7): qty 2

## 2020-06-07 MED ORDER — FENTANYL CITRATE (PF) 100 MCG/2ML IJ SOLN
100.0000 ug | Freq: Once | INTRAMUSCULAR | Status: AC
  Administered 2020-06-07: 100 ug via INTRAVENOUS

## 2020-06-07 MED ORDER — IOHEXOL 350 MG/ML SOLN
75.0000 mL | Freq: Once | INTRAVENOUS | Status: AC | PRN
  Administered 2020-06-07: 75 mL via INTRAVENOUS

## 2020-06-07 MED ORDER — LACTATED RINGERS IV BOLUS
500.0000 mL | Freq: Once | INTRAVENOUS | Status: AC
  Administered 2020-06-07: 500 mL via INTRAVENOUS

## 2020-06-07 MED ORDER — ADULT MULTIVITAMIN W/MINERALS CH
1.0000 | ORAL_TABLET | Freq: Every day | ORAL | Status: DC
  Administered 2020-06-07 – 2020-06-10 (×4): 1
  Filled 2020-06-07 (×4): qty 1

## 2020-06-07 MED ORDER — ORAL CARE MOUTH RINSE
15.0000 mL | OROMUCOSAL | Status: DC
  Administered 2020-06-07 – 2020-06-21 (×137): 15 mL via OROMUCOSAL

## 2020-06-07 MED ORDER — TRAZODONE HCL 50 MG PO TABS: 150.0000 mg | ORAL_TABLET | Freq: Every day | ORAL | Status: DC

## 2020-06-07 MED ORDER — VITAL HIGH PROTEIN PO LIQD
1000.0000 mL | ORAL | Status: DC
  Administered 2020-06-07 – 2020-06-09 (×4): 1000 mL

## 2020-06-07 MED ORDER — FENTANYL CITRATE (PF) 100 MCG/2ML IJ SOLN
INTRAMUSCULAR | Status: AC
  Filled 2020-06-07: qty 2

## 2020-06-07 MED ORDER — GADOBUTROL 1 MMOL/ML IV SOLN
10.0000 mL | Freq: Once | INTRAVENOUS | Status: AC | PRN
  Administered 2020-06-07: 10 mL via INTRAVENOUS

## 2020-06-07 MED ORDER — IBUPROFEN 100 MG/5ML PO SUSP
400.0000 mg | Freq: Three times a day (TID) | ORAL | Status: DC | PRN
  Administered 2020-06-07: 400 mg
  Filled 2020-06-07 (×2): qty 20

## 2020-06-07 MED ORDER — PANTOPRAZOLE SODIUM 40 MG IV SOLR
40.0000 mg | INTRAVENOUS | Status: DC
  Administered 2020-06-08 – 2020-07-04 (×26): 40 mg via INTRAVENOUS
  Filled 2020-06-07 (×26): qty 40

## 2020-06-07 MED ORDER — IBUPROFEN 100 MG/5ML PO SUSP
400.0000 mg | Freq: Three times a day (TID) | ORAL | Status: DC | PRN
  Filled 2020-06-07 (×2): qty 20

## 2020-06-07 MED ORDER — GABAPENTIN 250 MG/5ML PO SOLN
600.0000 mg | Freq: Three times a day (TID) | ORAL | Status: DC
  Administered 2020-06-07 – 2020-06-08 (×2): 600 mg
  Filled 2020-06-07 (×4): qty 12

## 2020-06-07 MED ORDER — SODIUM CHLORIDE 0.9% FLUSH
10.0000 mL | INTRAVENOUS | Status: DC | PRN
Start: 2020-06-07 — End: 2020-08-02
  Administered 2020-07-01: 30 mL

## 2020-06-07 MED ORDER — SODIUM CHLORIDE 0.9 % IV SOLN
1.0000 g | Freq: Three times a day (TID) | INTRAVENOUS | Status: DC
  Administered 2020-06-07 – 2020-06-08 (×3): 1 g via INTRAVENOUS
  Filled 2020-06-07 (×5): qty 1

## 2020-06-07 MED ORDER — ROCURONIUM BROMIDE 50 MG/5ML IV SOLN
INTRAVENOUS | Status: AC
  Administered 2020-06-07: 50 mg via INTRAVENOUS
  Filled 2020-06-07: qty 1

## 2020-06-07 NOTE — Consult Note (Signed)
PHARMACY CONSULT NOTE  Pharmacy Consult for Electrolyte Monitoring and Replacement   Recent Labs: Potassium (mmol/L)  Date Value  06/07/2020 2.8 (L)   Magnesium (mg/dL)  Date Value  56/38/9373 2.1   Calcium (mg/dL)  Date Value  42/87/6811 8.3 (L)   Albumin (g/dL)  Date Value  57/26/2035 2.9 (L)   Sodium (mmol/L)  Date Value  06/07/2020 143   Assessment: Patient is a 60 y/o M with medical history including diabetes, migraines, GERD, neurogenic bladder, HTN, chronic opioid use who is admitted with sepsis secondary to pneumonia. Patient is admitted to the ICU and is currently intubated, sedated, and on mechanical ventilation. Pharmacy consulted to assist with electrolyte monitoring and replacement as indicated.  Nutrition: Tube feeds + free water flushes 100 mL q4h (600 mL/day)  Scr trending up (1.06 >> 1.38), also with transaminitis  Goal of Therapy:  Electrolytes within normal limits  Plan:  --With persistent hypokalemia this admission. K+ 2.8 today which was replaced with KCl 40 mEq x 1 --Re-check potassium at 1700 along with magnesium and phosphorous as ordered --Continue to follow  Tressie Ellis 06/07/2020 4:06 PM

## 2020-06-07 NOTE — Progress Notes (Signed)
NAME:  Wyatt Umar., MRN:  [id number], DOB:  1960-01-25, LOS: 3 ADMISSION DATE:  06/04/2020   History of Present Illness:  60 y.o.malewith medical history significant forHTN, PTSD, diabetes,migraines,GERD,neurogenic bladder secondary to remote history of transverse myelitis,  who self catheterizes and has frequent UTIsas well aschronic back pain on chronic opiates who at baselineis independent - brought to the ER with a 3-day history of altered mental status.  +cough,shortness of breathand feveras well asvomiting and diarrhea and started becoming increasingly confused in the 24 hours prior to arrival.    Unable to get any meaningful history from patient.  ED course: On arrival, temperature 99.3, BP 167/67, pulse 99, respirations 18 with O2 sat 96% on room air. Blood work significant for leukocytosis of 13,600 with lactic acid of 1.2. Ammonia level normal at 15, EtOH less than 10. Sodium 130, potassium 3.2, glucose 249 creatinine 1.06. Urinalysis with 80 ketones and rare bacteria. UDS positive for opiates and tricyclic's. COVID and flu negative    Imaging:   CT chest abdomen and pelvis without contrast consistent with pneumonia. Multiple nodules left upper lobe likely infectious or inflammatory with follow-up imaging recommended to document resolution.  Cardiomegaly with trace pericardial effusion  Patient was started on sepsis fluids and broad-spectrum antibiotics initially for sepsis of unknown source.    Significant Hospital Events: Including procedures, antibiotic start and stop dates in addition to other pertinent events    5/31 admitted for sepsis present on admission for Left lung pneumonia  6/1 progressive confusion and hypoxia  6/2 transferred to ICU, INTUBATED  6/3 high fevers, remains ill   MICRO DATA COVID INF A/B NEG MRSA NEG BAL CULTURES PENDING  Antibiotics Given (last 72 hours)    Date/Time Action Medication Dose  Rate   06/04/20 1945 New Bag/Given   metroNIDAZOLE (FLAGYL) IVPB 500 mg 500 mg 100 mL/hr   06/04/20 2023 New Bag/Given  [unable to give at schedualed time due to not having any on floor.]   ceFEPIme (MAXIPIME) 2 g in sodium chloride 0.9 % 100 mL IVPB 2 g 200 mL/hr   06/04/20 2057 New Bag/Given   vancomycin (VANCOREADY) IVPB 1750 mg/350 mL 1,750 mg 175 mL/hr   06/05/20 0012 New Bag/Given   azithromycin (ZITHROMAX) 500 mg in sodium chloride 0.9 % 250 mL IVPB 500 mg 250 mL/hr   06/05/20 0800 New Bag/Given   cefTRIAXone (ROCEPHIN) 2 g in sodium chloride 0.9 % 100 mL IVPB 2 g 200 mL/hr   06/05/20 2337 New Bag/Given   azithromycin (ZITHROMAX) 500 mg in sodium chloride 0.9 % 250 mL IVPB 500 mg 250 mL/hr   06/06/20 0454 New Bag/Given   cefTRIAXone (ROCEPHIN) 2 g in sodium chloride 0.9 % 100 mL IVPB 2 g 200 mL/hr   06/06/20 2257 New Bag/Given   azithromycin (ZITHROMAX) 500 mg in sodium chloride 0.9 % 250 mL IVPB 500 mg 250 mL/hr   06/07/20 0547 New Bag/Given   cefTRIAXone (ROCEPHIN) 2 g in sodium chloride 0.9 % 100 mL IVPB 2 g 200 mL/hr      Interim History / Subjective:  Severe resp failure Severe hypoxia Remains critically ill High fevers       Objective   Blood pressure 118/64, pulse 93, temperature 98.42 F (36.9 C), temperature source Esophageal, resp. rate 17, height _0  (1.676 m), weight 95.6 kg, SpO2 95 %.    Vent Mode: PRVC FiO2 (%):  [40 %] 40 % Set Rate:  [16 bmp] 16 bmp Vt Set:  [  520 mL] 520 mL PEEP:  [10 cmH20] 10 cmH20 Plateau Pressure:  [19 cmH20-20 cmH20] 20 cmH20   Intake/Output Summary (Last 24 hours) at 06/07/2020 0745 Last data filed at 06/07/2020 0600 Gross per 24 hour  Intake 1701.3 ml  Output 1175 ml  Net 526.3 ml   Filed Weights   06/04/20 1717 06/06/20 0900  Weight: 90.7 kg 95.6 kg      REVIEW OF SYSTEMS  PATIENT IS UNABLE TO PROVIDE COMPLETE REVIEW OF SYSTEMS DUE TO SEVERE CRITICAL ILLNESS AND TOXIC METABOLIC ENCEPHALOPATHY   PHYSICAL  EXAMINATION:  GENERAL:critically ill appearing, +resp distress HEAD: Normocephalic, atraumatic.  EYES: Pupils equal, round, reactive to light.  No scleral icterus.  MOUTH: Moist mucosal membrane. NECK: Supple. PULMONARY: +rhonchi, +wheezing CARDIOVASCULAR: S1 and S2. Regular rate and rhythm. No murmurs, rubs, or gallops.  GASTROINTESTINAL: Soft, nontender, -distended. Positive bowel sounds.  MUSCULOSKELETAL: No swelling, clubbing, or edema.  NEUROLOGIC: obtunded SKIN:intact,warm,dry   Labs/imaging that I havepersonally reviewed  (right click and "Reselect all SmartList Selections" daily)       ASSESSMENT AND PLAN SYNOPSIS    60 yo white make with acute and severe hypoxic respiratory failure with acute and severe toxic metabolic encephalopathy with acute COPD Exacerbation LUL/LL multifocal pneumonia    Severe ACUTE Hypoxic and Hypercapnic Respiratory Failure -continue Mechanical Ventilator support -continue Bronchodilator Therapy -Wean Fio2 and PEEP as tolerated -VAP/VENT bundle implementation   SEVERE COPD EXACERBATION -continue IV steroids as prescribed -continue NEB THERAPY as prescribed -morphine as needed -wean fio2 as needed and tolerated   CARDIAC ICU monitoring   NEUROLOGY Acute toxic metabolic encephalopathy, need for sedation Goal RASS -2 to -3   INFECTIOUS DISEASE -continue antibiotics as prescribed -follow up cultures -follow up ID consultation S/p BRONCH all cultures pending  ENDO - ICU hypoglycemic\Hyperglycemia protocol -check FSBS per protocol   GI GI PROPHYLAXIS as indicated  NUTRITIONAL STATUS DIET-->TF's as tolerated Constipation protocol as indicated   ELECTROLYTES -follow labs as needed -replace as needed -pharmacy consultation and following     Best practice (right click and "Reselect all SmartList Selections" daily)  Diet:  NPO Pain/Anxiety/Delirium protocol (if indicated): Yes (RASS goal -2) VAP protocol (if  indicated): Yes DVT prophylaxis: Subcutaneous Heparin GI prophylaxis: H2B Glucose control:  SSI Yes Central venous access:  N/A Arterial line:  N/A Foley:  N/A Mobility:  bed rest  PT consulted: N/A Code Status:  full code Disposition: ICU    Labs   CBC: Recent Labs  Lab 06/04/20 1718 06/05/20 0716 06/06/20 1918 06/07/20 0516  WBC 13.6* 12.3* 12.7* 7.7  HGB 13.6 14.6 15.1 12.8*  HCT 38.9* 43.1 43.6 37.2*  MCV 85.9 86.2 85.3 84.7  PLT 165 132* 191 765    Basic Metabolic Panel: Recent Labs  Lab 06/04/20 1718 06/05/20 0838 06/06/20 1918 06/07/20 0516  NA 130* 133* 140 143  K 3.2* 3.2* 3.1* 2.8*  CL 95* 100 106 111  CO2 21* 18* 21* 23  GLUCOSE 249* 240* 240* 153*  BUN _0 29*  CREATININE 1.06 1.06 1.05 1.38*  CALCIUM 8.8* 8.3* 8.4* 8.3*  MG  --   --  2.1  --    GFR: Estimated Creatinine Clearance: 60.8 mL/min (A) (by C-G formula based on SCr of 1.38 mg/dL (H)). Recent Labs  Lab 06/04/20 1718 06/04/20 2148 06/05/20 0716 06/05/20 0729 06/05/20 0838 06/06/20 1918 06/07/20 0516  PROCALCITON  --  0.45  --   --  4.33  --   --  WBC 13.6*  --  12.3*  --   --  12.7* 7.7  LATICACIDVEN 1.2 3.1*  --  1.9 1.5  --   --     Liver Function Tests: Recent Labs  Lab 06/04/20 1718 06/06/20 1918  AST 14* 191*  ALT 11 110*  ALKPHOS 61 68  BILITOT 1.0 1.4*  PROT 6.9 6.7  ALBUMIN 3.4* 2.9*   No results for input(s): LIPASE, AMYLASE in the last 168 hours. Recent Labs  Lab 06/04/20 1718  AMMONIA 15    ABG    Component Value Date/Time   PHART 7.36 06/06/2020 2053   PCO2ART 40 06/06/2020 2053   PO2ART 104 06/06/2020 2053   HCO3 22.6 06/06/2020 2053   ACIDBASEDEF 2.7 (H) 06/06/2020 2053   O2SAT 97.8 06/06/2020 2053     Coagulation Profile: Recent Labs  Lab 06/05/20 0838  INR 1.2    Cardiac Enzymes: Recent Labs  Lab 06/06/20 1619  CKTOTAL 85    HbA1C: Hgb A1c MFr Bld  Date/Time Value Ref Range Status  06/04/2020 09:48 PM 7.4 (H) 4.8 -  5.6 % Final    Comment:    (NOTE)         Prediabetes: 5.7 - 6.4         Diabetes: >6.4         Glycemic control for adults with diabetes: <7.0     CBG: Recent Labs  Lab 06/06/20 1604 06/06/20 1929 06/06/20 2325 06/07/20 0405 06/07/20 0743  GLUCAP 218* 241* 197* 169* 123*    Allergies No Known Allergies     DVT/GI PRX  assessed I Assessed the need for Labs I Assessed the need for Foley I Assessed the need for Central Venous Line Family Discussion when available I Assessed the need for Mobilization I made an Assessment of medications to be adjusted accordingly Safety Risk assessment completed  CASE DISCUSSED IN MULTIDISCIPLINARY ROUNDS WITH ICU TEAM     Critical Care Time devoted to patient care services described in this note is 55  minutes.  Critical care was necessary to treat or prevent imminent or life-threatening deterioration. Overall, patient is critically ill, prognosis is guarded.  Patient with Multiorgan failure and at high risk for cardiac arrest and death.    Corrin Parker, M.D.  [facility]  Medical Director [place] Director [facility] Cardio-Pulmonary Department

## 2020-06-07 NOTE — Progress Notes (Signed)
RT assisted with patient transport to MRI and back to ICU with no complications. Patient was ventilated with the Trilogy ventilator for transport.

## 2020-06-07 NOTE — Procedures (Signed)
Intubation Procedure Note  [name]  [id number]  [birth year]  Date:06/07/20  Time:9:29 AM   Provider Performing:Ilanna Deihl D Elvina Sidle    Procedure: Intubation (31500)  Indication(s) Respiratory Failure  Consent Unable to obtain consent due to emergent nature of procedure.   Anesthesia Etomidate, Fentanyl and Rocuronium   Time Out Verified patient identification, verified procedure, site/side was marked, verified correct patient position, special equipment/implants available, medications/allergies/relevant history reviewed, required imaging and test results available.   Sterile Technique Usual hand hygeine, masks, and gloves were used   Procedure Description Patient positioned in bed supine.  Sedation given as noted above.  Patient was intubated with endotracheal tube using Glidescope.  View was Grade 1 full glottis .  Number of attempts was 1.  Colorimetric CO2 detector was consistent with tracheal placement.   Complications/Tolerance None; patient tolerated the procedure well. Chest X-ray is ordered to verify placement.   EBL Minimal   Specimen(s) None   Size 8.0 cm ETT  Secured at 23 cm at lip.    Harlon Ditty, AGACNP-BC [place] Prefer epic messenger for cross cover needs If after hours, please call E-link

## 2020-06-07 NOTE — Progress Notes (Signed)
PT Cancellation Note  Patient Details Name: Wyatt Ball. MRN: [id number] DOB: 1960-10-05   Cancelled Treatment:    Reason Eval/Treat Not Completed: Medical issues which prohibited therapy (Per chart review, patient transferred to CCU, intubated/sedated due to medical decline.  Will complete order at this time; please reconsult as medically appropriate.)  Taylon Louison H. Manson Passey, PT, DPT, NCS 06/07/20, 11:10 PM [phone]

## 2020-06-07 NOTE — Progress Notes (Signed)
SLP Cancellation Note  Patient Details Name: Wyatt Ball. MRN: [id number] DOB: February 04, 1960   Cancelled treatment:       Reason Eval/Treat Not Completed: Medical issues which prohibited therapy  Pt is currently intubated. Per attending, there are not any immediate plans for extubation. ST will sign off, please reconsult when pt is appropriate.   Karaline Buresh B. Dreama Saa M.S., CCC-SLP, [facility] Speech-Language Pathologist Rehabilitation Services Office [phone]  Ahlaya Ende Dreama Saa 06/07/2020, 11:17 AM

## 2020-06-07 NOTE — Consult Note (Signed)
PHARMACY CONSULT NOTE  Pharmacy Consult for Electrolyte Monitoring and Replacement   Recent Labs: Potassium (mmol/L)  Date Value  06/07/2020 3.3 (L)   Magnesium (mg/dL)  Date Value  28/31/5176 2.2   Calcium (mg/dL)  Date Value  16/07/3708 8.3 (L)   Albumin (g/dL)  Date Value  62/69/4854 2.9 (L)   Phosphorus (mg/dL)  Date Value  62/70/3500 2.7   Sodium (mmol/L)  Date Value  06/07/2020 143   Assessment: Patient is a 60 y/o M with medical history including diabetes, migraines, GERD, neurogenic bladder, HTN, chronic opioid use who is admitted with sepsis secondary to pneumonia. Patient is admitted to the ICU and is currently intubated, sedated, and on mechanical ventilation. Pharmacy consulted to assist with electrolyte monitoring and replacement as indicated.  Nutrition: Tube feeds @ 25 mL/hr + free water flushes 100 mL q4h (600 mL/day)  Scr trending up (1.06 >> 1.38), also with transaminitis  Goal of Therapy:  Electrolytes within normal limits  Plan:  --Repeat K 3.3 - ordered KCl 20 mEq PO x 1 dose  --Monitor electrolytes with AM labs  Reatha Armour, PharmD Pharmacy Resident  06/07/2020 6:37 PM

## 2020-06-07 NOTE — Progress Notes (Signed)
Peripherally Inserted Central Catheter Placement  The IV Nurse has discussed with the patient and/or persons authorized to consent for the patient, the purpose of this procedure and the potential benefits and risks involved with this procedure.  The benefits include less needle sticks, lab draws from the catheter, and the patient may be discharged home with the catheter. Risks include, but not limited to, infection, bleeding, blood clot (thrombus formation), and puncture of an artery; nerve damage and irregular heartbeat and possibility to perform a PICC exchange if needed/ordered by physician.  Alternatives to this procedure were also discussed.  Bard Power PICC patient education guide, fact sheet on infection prevention and patient information card has been provided to patient /or left at bedside.    PICC Placement Documentation  PICC Triple Lumen 06/07/20 PICC Right Basilic 38 cm 0 cm (Active)  Exposed Catheter (cm) 0 cm 06/07/20 1530  Site Assessment Clean;Dry;Intact 06/07/20 1530  Lumen #1 Status Flushed;Saline locked;Blood return noted 06/07/20 1530  Lumen #2 Status Flushed;Saline locked;Blood return noted 06/07/20 1530  Lumen #3 Status Flushed;Saline locked;Blood return noted 06/07/20 1530  Dressing Type Transparent;Securing device 06/07/20 1530  Dressing Status Clean;Dry;Intact 06/07/20 1530  Antimicrobial disc in place? Yes 06/07/20 1530  Safety Lock Not Applicable 06/07/20 1530  Dressing Change Due 06/14/20 06/07/20 1530       Romie Jumper 06/07/2020, 3:33 PM

## 2020-06-07 NOTE — Progress Notes (Signed)
   06/06/20 0445  Assess: MEWS Score  Temp (!) 102.9 F (39.4 C)  Assess: MEWS Score  MEWS Temp 2  MEWS Systolic 0  MEWS Pulse 0  MEWS RR 3  MEWS LOC 0  MEWS Score 5  MEWS Score Color Red  Assess: if the MEWS score is Yellow or Red  Were vital signs taken at a resting state? Yes  Focused Assessment Change from prior assessment (see assessment flowsheet)  Early Detection of Sepsis Score *See Row Information* High  MEWS guidelines implemented *See Row Information* Yes  Treat  MEWS Interventions Administered scheduled meds/treatments  Pain Scale PAINAD  Breathing 2  Negative Vocalization 1  Facial Expression 1  Body Language 1  Consolability 1  PAINAD Score 6  Take Vital Signs  Increase Vital Sign Frequency  Red: Q 1hr X 4 then Q 4hr X 4, if remains red, continue Q 4hrs  Escalate  MEWS: Escalate Red: discuss with charge nurse/RN and provider, consider discussing with RRT  Notify: Charge Nurse/RN  Name of Charge Nurse/RN Notified Lillia Abed, RN  Date Charge Nurse/RN Notified 06/06/20  Time Charge Nurse/RN Notified 0430  Notify: Provider  Provider Name/Title Lewie Chamber  Date Provider Notified 06/06/20  Time Provider Notified [phone]  Notification Type Page  Notification Reason Change in status  Provider response See new orders  Date of Provider Response 06/06/20  Time of Provider Response 0440  Document  Patient Outcome Not stable and remains on department  Inserted for Alea Millirons RN

## 2020-06-07 NOTE — Progress Notes (Signed)
Neurology Progress Note   S:// Patient was intubated yesterday for acute respiratory failure.  Chest x-ray shows worsening pneumonia He underwent a emergent bronchoscopy, with BAL sent for AFB and fungal cultures.   O:// Current vital signs: BP (!) 177/82   Pulse (!) 125   Temp 99.32 F (37.4 C)   Resp 16   Ht _0  (1.676 m)   Wt 95.6 kg   SpO2 98%   BMI 34.02 kg/m  Vital signs in last 24 hours: Temp:  [97.9 F (36.6 C)-102.74 F (39.3 C)] 99.32 F (37.4 C) (06/03 0900) Pulse Rate:  [78-169] 125 (06/03 0945) Resp:  [16-58] 16 (06/03 0945) BP: (109-249)/(60-125) 177/82 (06/03 0947) SpO2:  [93 %-99 %] 98 % (06/03 0945) FiO2 (%):  [40 %] 40 % (06/03 0733) General: Sedated on propofol intubated HEENT: Normocephalic atraumatic Lungs: Vented next cardiovascular: Regular rate rhythm Neurological examination sedated intubated No spontaneous movements but on reducing sedation, moves all 4 extremities equally Does not follow commands Pupils are equal round reactive to light and has present corneals bilaterally  Medications  Current Facility-Administered Medications:  .  cefTRIAXone (ROCEPHIN) 2 g in sodium chloride 0.9 % 100 mL IVPB, 2 g, Intravenous, Q24H, Dwyane Dee, MD, Stopped at 06/07/20 5397 .  chlorhexidine gluconate (MEDLINE KIT) (PERIDEX) 0.12 % solution 15 mL, 15 mL, Mouth Rinse, BID, Kasa, Kurian, MD, 15 mL at 06/07/20 0800 .  Chlorhexidine Gluconate Cloth 2 % PADS 6 each, 6 each, Topical, Daily, Dwyane Dee, MD, 6 each at 06/07/20 0950 .  enoxaparin (LOVENOX) injection 45 mg, 0.5 mg/kg, Subcutaneous, Q24H, Dwyane Dee, MD, 45 mg at 06/06/20 2249 .  fentaNYL 2574mg in NS 2519m(1082mml) infusion-PREMIX, 0-400 mcg/hr, Intravenous, Continuous, Kasa, Kurian, MD, Last Rate: 20 mL/hr at 06/07/20 1000, 200 mcg/hr at 06/07/20 1000 .  hydrALAZINE (APRESOLINE) injection 10 mg, 10 mg, Intravenous, QID, PatFritzi MandesD, 10 mg at 06/07/20 0947 .   HYDROcodone-acetaminophen (NORCO/VICODIN) 5-325 MG per tablet 1-2 tablet, 1-2 tablet, Oral, Q4H PRN, GirDwyane DeeD .  ibuprofen (ADVIL) 100 MG/5ML suspension 400 mg, 400 mg, Per Tube, Q8H PRN, BelRenda RollsPH, 400 mg at 06/07/20 0209 .  insulin aspart (novoLOG) injection 0-15 Units, 0-15 Units, Subcutaneous, Q4H, GirDwyane DeeD, 2 Units at 06/07/20 0800 .  labetalol (NORMODYNE) injection 10 mg, 10 mg, Intravenous, Q4H PRN, GirDwyane DeeD, 10 mg at 06/06/20 1827 .  MEDLINE mouth rinse, 15 mL, Mouth Rinse, 10 times per day, KasFlora LippsD, 15 mL at 06/07/20 0934 .  morphine 2 MG/ML injection 0.5 mg, 0.5 mg, Intravenous, Q6H PRN, PatFritzi MandesD, 0.5 mg at 06/06/20 1449 .  ondansetron (ZOFRAN) tablet 4 mg, 4 mg, Oral, Q6H PRN **OR** ondansetron (ZOFRAN) injection 4 mg, 4 mg, Intravenous, Q6H PRN, GirDwyane DeeD .  potassium chloride (KLOR-CON) packet 40 mEq, 40 mEq, Oral, Once, KasMortimer Friesurian, MD .  propofol (DIPRIVAN) 1000 MG/100ML infusion, 5-80 mcg/kg/min, Intravenous, Titrated, Kasa, Kurian, MD, Last Rate: 22.9 mL/hr at 06/07/20 1000, 40 mcg/kg/min at 06/07/20 1000 Labs CBC    Component Value Date/Time   WBC 7.7 06/07/2020 0516   RBC 4.39 06/07/2020 0516   HGB 12.8 (L) 06/07/2020 0516   HCT 37.2 (L) 06/07/2020 0516   PLT 150 06/07/2020 0516   MCV 84.7 06/07/2020 0516   MCH 29.2 06/07/2020 0516   MCHC 34.4 06/07/2020 0516   RDW 15.0 06/07/2020 0516    CMP     Component Value Date/Time   NA 143  06/07/2020 0516   K 2.8 (L) 06/07/2020 0516   CL 111 06/07/2020 0516   CO2 23 06/07/2020 0516   GLUCOSE 153 (H) 06/07/2020 0516   BUN 29 (H) 06/07/2020 0516   CREATININE 1.38 (H) 06/07/2020 0516   CALCIUM 8.3 (L) 06/07/2020 0516   PROT 6.7 06/06/2020 1918   ALBUMIN 2.9 (L) 06/06/2020 1918   AST 191 (H) 06/06/2020 1918   ALT 110 (H) 06/06/2020 1918   ALKPHOS 68 06/06/2020 1918   BILITOT 1.4 (H) 06/06/2020 1918   GFRNONAA 34 (L) 06/07/2020 0516    Imaging I have  reviewed images in epic and the results pertinent to this consultation are: CT head on arrival with no acute changes MR brain ordered and pending  Assessment: 60 year old with history of diabetes, anxiety, hypertension, prior history of transverse myelitis with ensuing neurogenic bladder requiring self-catheterization, PTSD, chronic back pain on opiates, presenting for multiple days of fevers and feeling unwell-admitted since 06/04/2020.  Being treated for pneumonia which was seen on chest x-ray but continues to be febrile and somewhat altered. Had respiratory failure yesterday requiring emergent intubation. Multiple medications on list- question of whether this was neuroleptic malignant syndrome due to promethazine.  CK normal, exam not suggestive of NMS. Other differentials to be considered are strokes as well. CNS infection although less likely should be kept in differentials but he has no meningitic signs on examination. 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 No suspicion for CNS infection but can consider acyclovir coverage for HSV PCR and plan the MRI brain with and without contrast followed by LP. Other thoughts would be to look for any sources of DVT due to unexplained fevers.  Lower extremity Dopplers should be looked at as well. Discussed the case in detail with Dr. Mortimer Fries  in the ICU.  -- Amie Portland, MD Neurologist [facility] Pager: [phone]  CRITICAL CARE ATTESTATION Performed by: Amie Portland, MD Total critical care time: 31 minutes Critical care time was exclusive of separately billable procedures and treating other patients and/or supervising APPs/Residents/Students Critical care was necessary to treat or prevent imminent or  life-threatening deterioration due to multifactorial toxic metabolic encephalopathy This patient is critically ill and at significant risk for neurological worsening and/or death and care requires constant monitoring. Critical care was time spent personally by me on the following activities: development of treatment plan with patient and/or surrogate as well as nursing, discussions with consultants, evaluation of patient's response to treatment, examination of patient, obtaining history from patient or surrogate, ordering and performing treatments and interventions, ordering and review of laboratory studies, ordering and review of radiographic studies, pulse oximetry, re-evaluation of patient's condition, participation in multidisciplinary rounds and medical decision making of high complexity in the care of this patient.

## 2020-06-07 NOTE — Progress Notes (Signed)
Initial Nutrition Assessment  DOCUMENTATION CODES:  Obesity unspecified  INTERVENTION:  Initiate tube feeding via OGT:  Vital High Protein at 25 ml/h (600 ml per day)  Prosource TF: 2 packets q6h (8 packets total) each provides 40kcal and 11g of protein  Free Water: q4h  Provides 1678 kcal, 141 gm protein, 1102 ml free water daily (TF+flush)  MVI with minerals via tube daily  NUTRITION DIAGNOSIS:  Inadequate oral intake related to inability to eat as evidenced by NPO status.  GOAL:  Provide needs based on ASPEN/SCCM guidelines  MONITOR:  TF tolerance,Vent status,Labs,I & O's  REASON FOR ASSESSMENT:  Ventilator    ASSESSMENT:  Pt presented to ED 5/31 with AMS x 3 days. Pt lives with brother who reports that pt has not felt well for several days PTA. Nausea, vomiting, cough, and aches. Confusion worsening over the last few days as well. Pt found to be septic on admission and imagine suggestive of pneumonia. PMH relevant for DM type 2, HTN, GERD.  Pt initially admitted to floor bed. Sustained fevers and poor mentation while on floor. SLP evaluated 6/2 and made pt NPO due to mental status. Neurology and ID consulted and worked-up. Imaging on 6/2 showed worsening pneumonia and pt more SOB and tachycardic. Moved to ICU and intubated 6/2  Pt intubated and sedated at the time of assessment. No family at bedside. Propofol rate increased over the course of the morning. Discussed in IDT, ok to feed. Will enter recommendations and monitor propofol rate for need to adjust TF rate.  6/2 - Intubated, bronchoscopy performed   Patient is currently intubated on ventilator support MV: 9.2 L/min Temp (24hrs), Avg:100.1 F (37.8 C), Min:97.9 F (36.6 C), Max:102.74 F (39.3 C)  Propofol: 28.7 ml/hr (758 kcal/d)   Intake/Output Summary (Last 24 hours) at 06/07/2020 0951 Last data filed at 06/07/2020 0800 Gross per 24 hour  Intake 1823.16 ml  Output 1175 ml  Net 648.16 ml   Net IO  Since Admission: -1,764.77 mL [06/07/20 0951]  Average Meal Intake prior to intubation: . 5/31-6/2: 0% intake x 2 recorded meals  Nutritionally Relevant Medications: Scheduled Meds: . fentaNYL (SUBLIMAZE) injection  100 mcg Intravenous Once  . insulin aspart  0-15 Units Subcutaneous Q4H   Continuous Infusions: . fentaNYL infusion INTRAVENOUS 100 mcg/hr (06/07/20 0800)  . propofol (DIPRIVAN) infusion 35 mcg/kg/min (06/07/20 0800)   PRN Meds: ondansetron  Labs Reviewed:  K 2.8  BUN 29, Creatinine 1.38  SBG ranges from 123-241 mg/dL over the last 24 hours  HgbA1c 7.4% (5/31)  NUTRITION - FOCUSED PHYSICAL EXAM: Flowsheet Row Most Recent Value  Orbital Region No depletion  Upper Arm Region No depletion  Thoracic and Lumbar Region No depletion  Buccal Region No depletion  Temple Region Mild depletion  Clavicle Bone Region Mild depletion  Clavicle and Acromion Bone Region Mild depletion  Scapular Bone Region Mild depletion  Dorsal Hand Unable to assess  [mittens in place]  Patellar Region No depletion  Anterior Thigh Region No depletion  Posterior Calf Region No depletion  Edema (RD Assessment) Mild  Hair Reviewed  Eyes Unable to assess  Mouth Unable to assess  Skin Reviewed  Nails Unable to assess     Diet Order:   Diet Order            Diet NPO time specified  Diet effective now                EDUCATION NEEDS:  No education needs  have been identified at this time  Skin:  Skin Assessment: Reviewed RN Assessment  Last BM:  6/1 per RN documentation  Height:  Ht Readings from Last 1 Encounters:  06/07/20 5\' 6"  (1.676 m)    Weight:  Wt Readings from Last 1 Encounters:  06/06/20 95.6 kg    Ideal Body Weight:  64.5 kg  BMI:  Body mass index is 34.02 kg/m.  Estimated Nutritional Needs:   Kcal:  1100-1400 kcal/d  Protein:  130-160 g/d  Fluid:  >1.5 L/d   08/06/20, RD, LDN Clinical Dietitian Pager on Amion

## 2020-06-07 NOTE — Progress Notes (Addendum)
Date of Admission:  06/04/2020     ID: Wyatt Ball. is a 60 y.o. male  Principal Problem:   CAP (community acquired pneumonia) Active Problems:   Acute metabolic encephalopathy   Sepsis ([place])   Neurogenic bladder   Chronic, continuous use of opioids   Chronic low back pain   Type 2 diabetes mellitus without complication (HCC)   Migraines   Recurrent UTI   History of colon polyps   Urinary retention   Altered mental status    Subjective: intubated  Medications:  . chlorhexidine gluconate (MEDLINE KIT)  15 mL Mouth Rinse BID  . Chlorhexidine Gluconate Cloth  6 each Topical Daily  . enoxaparin (LOVENOX) injection  0.5 mg/kg Subcutaneous Q24H  . feeding supplement (PROSource TF)  90 mL Per Tube QID  . feeding supplement (VITAL HIGH PROTEIN)  1,000 mL Per Tube Q24H  . free water  100 mL Per Tube Q4H  . insulin aspart  0-15 Units Subcutaneous Q4H  . mouth rinse  15 mL Mouth Rinse 10 times per day  . multivitamin with minerals  1 tablet Per Tube Daily    Objective: Vital signs in last 24 hours: Temp:  [97.9 F (36.6 C)-102.74 F (39.3 C)] 100.6 F (38.1 C) (06/03 1230) Pulse Rate:  [86-169] 106 (06/03 1245) Resp:  [16-58] 17 (06/03 1245) BP: (109-249)/(60-125) 124/72 (06/03 1245) SpO2:  [93 %-99 %] 96 % (06/03 1245) FiO2 (%):  [40 %] 40 % (06/03 0733)  PHYSICAL EXAM:  General: intubated and sedated Lungs: decreased air entry left side Heart: tachycardia Abdomen: Soft, non-tender,not distended. Bowel sounds normal. No masses Extremities: atraumatic, no cyanosis. No edema. No clubbing Skin: No rashes or lesions. Or bruising Lymph: Cervical, supraclavicular normal. Neurologic: cannot assess Lab Results Recent Labs    06/06/20 1918 06/07/20 0516  WBC 12.7* 7.7  HGB 15.1 12.8*  HCT 43.6 37.2*  NA 140 143  K 3.1* 2.8*  CL 106 111  CO2 21* 23  BUN 23 29*  CREATININE 1.05 1.38*   Liver Panel Recent Labs    06/04/20 1718 06/06/20 1918  PROT  6.9 6.7  ALBUMIN 3.4* 2.9*  AST 14* 191*  ALT 11 110*  ALKPHOS 61 68  BILITOT 1.0 1.4*   Sedimentation Rate No results for input(s): ESRSEDRATE in the last 72 hours. C-Reactive Protein No results for input(s): CRP in the last 72 hours.  Microbiology:  Studies/Results: DG Abd 1 View  Result Date: 06/07/2020 CLINICAL DATA:  OG placement EXAM: ABDOMEN - 1 VIEW COMPARISON:  06/06/2020 FINDINGS: Gastric tube enters the stomach with the tip near the antrum. Normal bowel gas pattern. No significant change from yesterday. IMPRESSION: Gastric tube tip in the antrum.  Normal bowel gas pattern. Electronically Signed   By: Franchot Gallo M.D.   On: 06/07/2020 10:09   DG Abd 1 View  Result Date: 06/06/2020 CLINICAL DATA:  Intubated, enteric catheter placement, pneumonia EXAM: ABDOMEN - 1 VIEW COMPARISON:  06/04/2020 FINDINGS: Frontal view of the abdomen and pelvis excludes the pubic symphysis by collimation. Enteric catheter tip projects over the gastric antrum. Bowel gas pattern is unremarkable. No acute bony abnormalities. IMPRESSION: 1. Enteric catheter tip projecting over the gastric antrum. Electronically Signed   By: Randa Ngo M.D.   On: 06/06/2020 19:05   CT ANGIO CHEST PE W OR WO CONTRAST  Result Date: 06/07/2020 CLINICAL DATA:  Acute respiratory failure.  Pneumonia. EXAM: CT ANGIOGRAPHY CHEST WITH CONTRAST TECHNIQUE: Multidetector CT imaging of the  chest was performed using the standard protocol during bolus administration of intravenous contrast. Multiplanar CT image reconstructions and MIPs were obtained to evaluate the vascular anatomy. CONTRAST:  75 mL OMNIPAQUE IOHEXOL 350 MG/ML SOLN COMPARISON:  Single-view of the chest 06/07/2020, 06/06/2020 and 06/04/2020. CT chest, abdomen and pelvis 06/04/2020. FINDINGS: Cardiovascular: No pulmonary embolus. There is cardiomegaly. No aortic aneurysm. Calcific aortic atherosclerosis noted. Mediastinum/Nodes: No enlarged mediastinal, hilar, or axillary  lymph nodes. Thyroid gland, trachea, and esophagus demonstrate no significant findings. Lungs/Pleura: Small to moderate left apical pleural effusion is seen. Dense airspace disease is present in the left upper and lower lobes. The appearance is much worse than on the prior CT. Small nodular densities in the left upper lobe seen on the prior CT are not visible today and likely obscured by pleural fluid and consolidation. Minimal dependent atelectasis on the left. Upper Abdomen: No acute or focal abnormality. Musculoskeletal: Negative. Review of the MIP images confirms the above findings. IMPRESSION: Negative for pulmonary embolus. Marked worsening of airspace disease in the left chest most consistent with progressive pneumonia. New small to moderate left pleural effusion is present. Cardiomegaly. Aortic Atherosclerosis (ICD10-I70.0). Electronically Signed   By: Inge Rise M.D.   On: 06/07/2020 11:50   DG Chest Port 1 View  Result Date: 06/07/2020 CLINICAL DATA:  Status post intubation and OG placement EXAM: PORTABLE CHEST 1 VIEW COMPARISON:  06/06/2020 FINDINGS: Endotracheal tube remains in good position. Gastric tube enters the stomach. Dense consolidation in the left lower lobe is unchanged. Small left effusion. Right lung clear. IMPRESSION: Endotracheal tube in good position Dense consolidation left lower lobe unchanged Electronically Signed   By: Franchot Gallo M.D.   On: 06/07/2020 10:11   DG CHEST PORT 1 VIEW  Result Date: 06/06/2020 CLINICAL DATA:  Intubated, previous abnormal chest x-ray EXAM: PORTABLE CHEST 1 VIEW COMPARISON:  06/06/2020 at 5:06 p.m. FINDINGS: Single frontal view of the chest demonstrates interval placement of an endotracheal tube, tip overlying tracheal air column at level of thoracic inlet. Enteric catheter passes below diaphragm tip excluded by collimation. Cardiac silhouette is stable. Continued dense consolidation within the left lateral hemithorax. No effusion or  pneumothorax. IMPRESSION: 1. Support devices as above. 2. Persistent left-sided pneumonia. Electronically Signed   By: Randa Ngo M.D.   On: 06/06/2020 19:04   DG Chest Port 1 View  Result Date: 06/06/2020 CLINICAL DATA:  Shortness of breath. EXAM: PORTABLE CHEST 1 VIEW COMPARISON:  Radiograph and CT 06/04/2020 FINDINGS: Progressive opacification of the lateral left hemithorax suspicious for worsening pneumonia. No convincing pleural effusion. Mild vascular congestion is developing. Upper normal heart size. No pneumothorax. IMPRESSION: 1. Progressive opacification of the lateral left hemithorax suspicious for worsening pneumonia. 2. Developing vascular congestion. Electronically Signed   By: Keith Rake M.D.   On: 06/06/2020 17:16   [place] EKG SITE RITE  Result Date: 06/07/2020 If Site Rite image not attached, placement could not be confirmed due to current cardiac rhythm.    Assessment/Plan: Acute hypoxic respiratory failure with respiratory distress secondary to left lung pneumonia.  Community onset pneumonia.  Patient has been intubated.  Continues to have fever.  Worsening infiltrate.  Currently on ceftriaxone and azithromycin.  Differential diagnosis likely atypical pneumonia like Legionella.  But will need to rule out Pseudomonas or other gram-negative's.  Hence we will change ceftriaxone to meropenem and continue azithromycin.  MRSA nares PCR is negative and hence we will MRSA is less likely  Satph epidermidis in 1 blood bottle is  a contaminant  Encephalopathy could be due to the pneumonia. Currently there is no concern for herpes encephalitis or meningitis.  MRI pending.  Chronic pain syndrome on opioids.  Smoker.  Discussed the management with the care team.

## 2020-06-08 ENCOUNTER — Inpatient Hospital Stay (HOSPITAL_COMMUNITY)
Admit: 2020-06-08 | Discharge: 2020-06-08 | Disposition: A | Discharge status: Home / Self Care | Payer: No Typology Code available for payment source | Attending: Internal Medicine | Admitting: Internal Medicine

## 2020-06-08 DIAGNOSIS — J189 Pneumonia, unspecified organism: Secondary | ICD-10-CM | POA: Diagnosis not present

## 2020-06-08 DIAGNOSIS — G9341 Metabolic encephalopathy: Secondary | ICD-10-CM | POA: Diagnosis not present

## 2020-06-08 DIAGNOSIS — R0603 Acute respiratory distress: Secondary | ICD-10-CM

## 2020-06-08 LAB — CBC
HCT: 35.6 % — ABNORMAL LOW (ref 39.0–52.0)
Hemoglobin: 11.8 g/dL — ABNORMAL LOW (ref 13.0–17.0)
MCH: 29.9 pg (ref 26.0–34.0)
MCHC: 33.1 g/dL (ref 30.0–36.0)
MCV: 90.4 fL (ref 80.0–100.0)
Platelets: 128 10*3/uL — ABNORMAL LOW (ref 150–400)
RBC: 3.94 MIL/uL — ABNORMAL LOW (ref 4.22–5.81)
RDW: 15.8 % — ABNORMAL HIGH (ref 11.5–15.5)
WBC: 7 10*3/uL (ref 4.0–10.5)
nRBC: 0 % (ref 0.0–0.2)

## 2020-06-08 LAB — BASIC METABOLIC PANEL
Anion gap: 8 (ref 5–15)
BUN: 42 mg/dL — ABNORMAL HIGH (ref 8–23)
CO2: 22 mmol/L (ref 22–32)
Calcium: 8.3 mg/dL — ABNORMAL LOW (ref 8.9–10.3)
Chloride: 112 mmol/L — ABNORMAL HIGH (ref 98–111)
Creatinine, Ser: 2.16 mg/dL — ABNORMAL HIGH (ref 0.61–1.24)
GFR, Estimated: 34 mL/min — ABNORMAL LOW (ref >60)
Glucose, Bld: 199 mg/dL — ABNORMAL HIGH (ref 70–99)
Potassium: 3.3 mmol/L — ABNORMAL LOW (ref 3.5–5.1)
Sodium: 142 mmol/L (ref 135–145)

## 2020-06-08 LAB — RESPIRATORY PANEL BY PCR

## 2020-06-08 LAB — URINE CULTURE: Culture: NO GROWTH

## 2020-06-08 LAB — PHOSPHORUS
Phosphorus: 4 mg/dL (ref 2.5–4.6)
Phosphorus: 4.3 mg/dL (ref 2.5–4.6)

## 2020-06-08 LAB — ACID FAST SMEAR (AFB, MYCOBACTERIA): Acid Fast Smear: NEGATIVE

## 2020-06-08 LAB — GLUCOSE, CAPILLARY
Glucose-Capillary: 149 mg/dL — ABNORMAL HIGH (ref 70–99)
Glucose-Capillary: 171 mg/dL — ABNORMAL HIGH (ref 70–99)
Glucose-Capillary: 178 mg/dL — ABNORMAL HIGH (ref 70–99)
Glucose-Capillary: 187 mg/dL — ABNORMAL HIGH (ref 70–99)
Glucose-Capillary: 196 mg/dL — ABNORMAL HIGH (ref 70–99)
Glucose-Capillary: 196 mg/dL — ABNORMAL HIGH (ref 70–99)

## 2020-06-08 LAB — MAGNESIUM
Magnesium: 2.4 mg/dL (ref 1.7–2.4)
Magnesium: 2.5 mg/dL — ABNORMAL HIGH (ref 1.7–2.4)

## 2020-06-08 MED ORDER — PERFLUTREN LIPID MICROSPHERE
1.0000 mL | INTRAVENOUS | Status: AC | PRN
  Filled 2020-06-08: qty 10

## 2020-06-08 MED ORDER — MIDAZOLAM HCL 2 MG/2ML IJ SOLN
2.0000 mg | INTRAMUSCULAR | Status: DC | PRN
  Administered 2020-06-08 – 2020-06-17 (×5): 2 mg via INTRAVENOUS
  Filled 2020-06-08 (×4): qty 2

## 2020-06-08 MED ORDER — POTASSIUM CHLORIDE 20 MEQ PO PACK
20.0000 meq | PACK | Freq: Once | ORAL | Status: AC
  Administered 2020-06-08: 20 meq
  Filled 2020-06-08: qty 1

## 2020-06-08 MED ORDER — IBUPROFEN 100 MG/5ML PO SUSP
400.0000 mg | Freq: Three times a day (TID) | ORAL | Status: DC | PRN
  Administered 2020-06-08: 400 mg
  Filled 2020-06-08 (×2): qty 20

## 2020-06-08 MED ORDER — SODIUM CHLORIDE 0.9 % IV SOLN
1.0000 g | Freq: Two times a day (BID) | INTRAVENOUS | Status: DC
  Administered 2020-06-08: 1 g via INTRAVENOUS
  Filled 2020-06-08 (×3): qty 1

## 2020-06-08 MED ORDER — FENTANYL BOLUS VIA INFUSION
25.0000 ug | INTRAVENOUS | Status: DC | PRN
  Administered 2020-06-08 – 2020-06-17 (×6): 25 ug via INTRAVENOUS
  Filled 2020-06-08: qty 25

## 2020-06-08 NOTE — Plan of Care (Signed)
Neuro: stable on sedation, tolerating adjustments well Resp: stable on ventilator CV: TMAX 38.3-placed on cooling blanket, vital signs otherwise stable,  GIGU: tolerating tube feeds well, BM x 1, coude cath in place Skin: clean dry and intact, intermittently clammy Social: Daughter visited this afternoon, all questions and concerns addressed.  Problem: Education: Goal: Knowledge of General Education information will improve Description: Including pain rating scale, medication(s)/side effects and non-pharmacologic comfort measures Outcome: Not Progressing   Problem: Health Behavior/Discharge Planning: Goal: Ability to manage health-related needs will improve Outcome: Not Progressing   Problem: Clinical Measurements: Goal: Ability to maintain clinical measurements within normal limits will improve Outcome: Not Progressing Goal: Will remain free from infection Outcome: Not Progressing Goal: Diagnostic test results will improve Outcome: Not Progressing Goal: Respiratory complications will improve Outcome: Not Progressing Goal: Cardiovascular complication will be avoided Outcome: Not Progressing   Problem: Activity: Goal: Risk for activity intolerance will decrease Outcome: Not Progressing   Problem: Nutrition: Goal: Adequate nutrition will be maintained Outcome: Not Progressing   Problem: Coping: Goal: Level of anxiety will decrease Outcome: Not Progressing   Problem: Elimination: Goal: Will not experience complications related to bowel motility Outcome: Not Progressing Goal: Will not experience complications related to urinary retention Outcome: Not Progressing   Problem: Pain Managment: Goal: General experience of comfort will improve Outcome: Not Progressing   Problem: Safety: Goal: Ability to remain free from injury will improve Outcome: Not Progressing   Problem: Skin Integrity: Goal: Risk for impaired skin integrity will decrease Outcome: Not Progressing

## 2020-06-08 NOTE — Progress Notes (Signed)
Currently being managed for acute hypoxic resp failure as well as CAP/COPDex Not likely a CNS infection. ID also on board No further neurological w/u Recall if needed D/W E. Anna Genre, NP in the ICU  -- Milon Dikes, MD Neurologist [facility] Pager: [phone]

## 2020-06-08 NOTE — Progress Notes (Signed)
NAME:  Wyatt Curran., MRN:  [id number], DOB:  Oct 31, 1960, LOS: 4 ADMISSION DATE:  06/04/2020 60 y.o.malewith medical history significant forHTN, PTSD, diabetes,migraines,GERD,neurogenic bladder secondary to remote history of transverse myelitis,  who self catheterizes and has frequent UTIsas well aschronic back pain on chronic opiates who at baselineis independent -brought to the ER with a 3-day history of altered mental status.  +cough,shortness of breathand feveras well asvomiting and diarrhea and started becoming increasingly confused in the 24 hours prior to arrival.   Unable to get any meaningful history from patient.  ED course: On arrival, temperature 99.3, BP 167/67, pulse 99, respirations 18 with O2 sat 96% on room air. Blood work significant for leukocytosis of 13,600 with lactic acid of 1.2. Ammonia level normal at 15, EtOH less than 10. Sodium 130, potassium 3.2, glucose 249 creatinine 1.06. Urinalysis with 80 ketones and rare bacteria. UDS positive for opiates and tricyclic's. COVID and flu negative    Imaging:   CT chest abdomen and pelvis without contrast consistent with pneumonia. Multiple nodules left upper lobe likely infectious or inflammatory with follow-up imaging recommended to document resolution. Cardiomegaly with trace pericardial effusion  Patient was started on sepsis fluids and broad-spectrum antibiotics initially for sepsis of unknown source.   Significant Hospital Events: Including procedures, antibiotic start and stop dates in addition to other pertinent events   5/31 admitted for sepsis present on admission for Left lung pneumonia  6/1 progressive confusion and hypoxia  6/2 transferred to ICU, INTUBATED, S/P BRONCH  6/3 high fevers, remains ill, SELF EXTUBATED  6/3 emergently re-intubated   MICRO DATA COVID INF A/B NEG MRSA NEG BAL CULTURES PENDING     Interim History / Subjective:  Remains  critically ill Severe resp failure Remains on vent Therapy for pneumonia Cultures pending    Objective   Blood pressure (!) 110/54, pulse (!) 120, temperature 99.14 F (37.3 C), temperature source Esophageal, resp. rate 15, height _0  (1.676 m), weight 94.9 kg, SpO2 95 %.    Vent Mode: PRVC FiO2 (%):  [40 %] 40 % Set Rate:  [16 bmp] 16 bmp Vt Set:  [520 mL] 520 mL PEEP:  [10 cmH20] 10 cmH20   Intake/Output Summary (Last 24 hours) at 06/08/2020 0747 Last data filed at 06/08/2020 0600 Gross per 24 hour  Intake 2976.29 ml  Output 625 ml  Net 2351.29 ml   Filed Weights   06/04/20 1717 06/06/20 0900 06/08/20 0500  Weight: 90.7 kg 95.6 kg 94.9 kg      REVIEW OF SYSTEMS  PATIENT IS UNABLE TO PROVIDE COMPLETE REVIEW OF SYSTEMS DUE TO SEVERE CRITICAL ILLNESS AND TOXIC METABOLIC ENCEPHALOPATHY   PHYSICAL EXAMINATION:  GENERAL:critically ill appearing, +resp distress HEAD: Normocephalic, atraumatic.  EYES: Pupils equal, round, reactive to light.  No scleral icterus.  MOUTH: Moist mucosal membrane. NECK: Supple. PULMONARY: +rhonchi, +wheezing CARDIOVASCULAR: S1 and S2. Regular rate and rhythm. No murmurs, rubs, or gallops.  GASTROINTESTINAL: Soft, nontender, -distended. Positive bowel sounds.  MUSCULOSKELETAL: No swelling, clubbing, or edema.  NEUROLOGIC: obtunded SKIN:intact,warm,dry   Labs/imaging that I havepersonally reviewed  (right click and "Reselect all SmartList Selections" daily)       ASSESSMENT AND PLAN SYNOPSIS   60 yo white make with acute and severe hypoxic respiratory failure with acute and severe toxic metabolic encephalopathy with acute COPD ExacerbationLUL/LL multifocal pneumonia Self extubated and re intubated emergently  Severe ACUTE Hypoxic and Hypercapnic Respiratory Failure -continue Mechanical Ventilator support -continue Bronchodilator Therapy -Wean Fio2 and PEEP  as tolerated -VAP/VENT bundle implementation -will NOT perform SAT/SBT  due to severe resp failure   SEVERE COPD EXACERBATION -continue IV steroids as prescribed -continue NEB THERAPY as prescribed -morphine as needed -wean fio2 as needed and tolerated   CARDIAC ICU monitoring     NEUROLOGY Acute toxic metabolic encephalopathy, need for sedation Goal RASS -2 to -3   INFECTIOUS DISEASE -continue antibiotics as prescribed -follow up cultures -follow up ID consultation  ENDO - ICU hypoglycemic\Hyperglycemia protocol -check FSBS per protocol   GI GI PROPHYLAXIS as indicated  NUTRITIONAL STATUS DIET-->TF's as tolerated Constipation protocol as indicated   ELECTROLYTES -follow labs as needed -replace as needed -pharmacy consultation and following    Best practice (right click and "Reselect all SmartList Selections" daily)  Diet:NPO Pain/Anxiety/Delirium protocol (if indicated):Yes(RASS goal -2) VAP protocol (if indicated):Yes DVT prophylaxis:Subcutaneous Heparin GI prophylaxis:H2B Glucose control:SSIYes Central venous access:N/A Arterial line:N/A Foley:N/A Mobility:bed rest PT consulted:N/A Code Status:full code Disposition:ICU      Labs   CBC: Recent Labs  Lab 06/04/20 1718 06/05/20 0716 06/06/20 1918 06/07/20 0516 06/08/20 0534  WBC 13.6* 12.3* 12.7* 7.7 7.0  HGB 13.6 14.6 15.1 12.8* 11.8*  HCT 38.9* 43.1 43.6 37.2* 35.6*  MCV 85.9 86.2 85.3 84.7 90.4  PLT 165 132* 191 150 128*    Basic Metabolic Panel: Recent Labs  Lab 06/04/20 1718 06/05/20 0838 06/06/20 1918 06/07/20 0516 06/07/20 1710 06/08/20 0534  NA 130* 133* 140 143  --  142  K 3.2* 3.2* 3.1* 2.8* 3.3* 3.3*  CL 95* 100 106 111  --  112*  CO2 21* 18* 21* 23  --  22  GLUCOSE 249* 240* 240* 153*  --  199*  BUN _0 29*  --  42*  CREATININE 1.06 1.06 1.05 1.38*  --  2.16*  CALCIUM 8.8* 8.3* 8.4* 8.3*  --  8.3*  MG  --   --  2.1  --  2.2 2.4  PHOS  --   --   --   --  2.7 4.0   GFR: Estimated Creatinine  Clearance: 38.7 mL/min (A) (by C-G formula based on SCr of 2.16 mg/dL (H)). Recent Labs  Lab 06/04/20 1718 06/04/20 2148 06/05/20 0716 06/05/20 0729 06/05/20 0838 06/06/20 1918 06/07/20 0516 06/08/20 0534  PROCALCITON  --  0.45  --   --  4.33  --   --   --   WBC 13.6*  --  12.3*  --   --  12.7* 7.7 7.0  LATICACIDVEN 1.2 3.1*  --  1.9 1.5  --   --   --     Liver Function Tests: Recent Labs  Lab 06/04/20 1718 06/06/20 1918  AST 14* 191*  ALT 11 110*  ALKPHOS 61 68  BILITOT 1.0 1.4*  PROT 6.9 6.7  ALBUMIN 3.4* 2.9*   No results for input(s): LIPASE, AMYLASE in the last 168 hours. Recent Labs  Lab 06/04/20 1718  AMMONIA 15    ABG    Component Value Date/Time   PHART 7.33 (L) 06/08/2020 0500   PCO2ART 40 06/08/2020 0500   PO2ART 99 06/08/2020 0500   HCO3 21.1 06/08/2020 0500   ACIDBASEDEF 4.5 (H) 06/08/2020 0500   O2SAT 97.2 06/08/2020 0500     Coagulation Profile: Recent Labs  Lab 06/05/20 0838  INR 1.2    Cardiac Enzymes: Recent Labs  Lab 06/06/20 1619  CKTOTAL 85    HbA1C: Hgb A1c MFr Bld  Date/Time Value Ref Range Status  06/04/2020  09:48 PM 7.4 (H) 4.8 - 5.6 % Final    Comment:    (NOTE)         Prediabetes: 5.7 - 6.4         Diabetes: >6.4         Glycemic control for adults with diabetes: <7.0     CBG: Recent Labs  Lab 06/07/20 1146 06/07/20 1555 06/07/20 1925 06/07/20 2332 06/08/20 0354  GLUCAP 146* 152* 160* 207* 196*    Allergies No Known Allergies     DVT/GI PRX  assessed I Assessed the need for Labs I Assessed the need for Foley I Assessed the need for Central Venous Line Family Discussion when available I Assessed the need for Mobilization I made an Assessment of medications to be adjusted accordingly Safety Risk assessment completed  CASE DISCUSSED IN MULTIDISCIPLINARY ROUNDS WITH ICU TEAM     Critical Care Time devoted to patient care services described in this note is 55  minutes.  Critical care was  necessary to treat or prevent imminent or life-threatening deterioration. Overall, patient is critically ill, prognosis is guarded.    I anticipate prolonged ICU LOS   Corrin Parker, M.D.  [facility]  Medical Director [place] Director [facility] Cardio-Pulmonary Department

## 2020-06-08 NOTE — Consult Note (Signed)
PHARMACY CONSULT NOTE  Pharmacy Consult for Electrolyte Monitoring and Replacement   Recent Labs: Potassium (mmol/L)  Date Value  06/08/2020 3.3 (L)   Magnesium (mg/dL)  Date Value  59/93/5701 2.4   Calcium (mg/dL)  Date Value  77/93/9030 8.3 (L)   Albumin (g/dL)  Date Value  09/27/3005 2.9 (L)   Phosphorus (mg/dL)  Date Value  62/26/3335 4.0   Sodium (mmol/L)  Date Value  06/08/2020 142   Assessment: Patient is a 60 y/o M with medical history including diabetes, migraines, GERD, neurogenic bladder, HTN, chronic opioid use who is admitted with sepsis secondary to pneumonia. Patient is admitted to the ICU and is currently intubated, sedated, and on mechanical ventilation.   Pharmacy consulted to assist with electrolyte monitoring and replacement as indicated.  Nutrition: Tube feeds @ 25 mL/hr + free water flushes 100 mL q4h (600 mL/day)  Scr trending up (1.06 > 1.38 > 2.16), also with transaminitis  Goal of Therapy:  Electrolytes within normal limits  Plan:  --K 3.3 - ordered KCl 20 mEq PO x 1 dose  --Monitor electrolytes with AM labs  Albina Billet, PharmD, BCPS Clinical Pharmacist 06/08/2020 8:43 AM

## 2020-06-08 NOTE — Progress Notes (Signed)
PHARMACY NOTE:  ANTIMICROBIAL RENAL DOSAGE ADJUSTMENT  Current antimicrobial regimen includes a mismatch between antimicrobial dosage and estimated renal function.  As per policy approved by the Pharmacy & Therapeutics and Medical Executive Committees, the antimicrobial dosage will be adjusted accordingly.  Current antimicrobial dosage:  Meropenem 1g q8h  Indication: PNA  Renal Function:  Estimated Creatinine Clearance: 38.7 mL/min (A) (by C-G formula based on SCr of 2.16 mg/dL (H)). []      On intermittent HD, scheduled: []      On CRRT    Antimicrobial dosage has been changed to:  Meropenem 1g q12  Additional comments:   Thank you for allowing pharmacy to be a part of this patient's care.  , PharmD, BCPS Clinical Pharmacist 06/08/2020 8:52 AM

## 2020-06-08 NOTE — Progress Notes (Signed)
GOALS OF CARE DISCUSSION  The Clinical status was relayed to family in detail. Daughter at Bedside  Updated and notified of patients medical condition.    Patient remains unresponsive and will not open eyes to command.   Patient is having a weak cough and struggling to remove secretions.   Patient with increased WOB and using accessory muscles to breathe Explained to family course of therapy and the modalities  Severe PNEUMONIA WITH SEPSIS AND SEVERE RESP FAILURE WITH COPD  Patient with Progressive multiorgan failure with a very high probablity of a very minimal chance of meaningful recovery despite all aggressive and optimal medical therapy.  PATIENT REMAINS FULL CODE  Family understands the situation. Continue vent support Follow up ID recs I anticipate prolonged ICU LOS  Family are satisfied with Plan of action and management. All questions answered  Additional CC time 35 mins   Beverly Suriano Santiago Glad, M.D.  [facility]  Medical Director [facility] Medical Director [facility] Cardio-Pulmonary Department

## 2020-06-09 DIAGNOSIS — J189 Pneumonia, unspecified organism: Secondary | ICD-10-CM | POA: Diagnosis not present

## 2020-06-09 DIAGNOSIS — R4182 Altered mental status, unspecified: Secondary | ICD-10-CM | POA: Diagnosis not present

## 2020-06-09 DIAGNOSIS — G9341 Metabolic encephalopathy: Secondary | ICD-10-CM | POA: Diagnosis not present

## 2020-06-09 LAB — URINALYSIS, ROUTINE W REFLEX MICROSCOPIC
Bacteria, UA: NONE SEEN
Bilirubin Urine: NEGATIVE
Glucose, UA: NEGATIVE mg/dL
Ketones, ur: NEGATIVE mg/dL
Leukocytes,Ua: NEGATIVE
Nitrite: NEGATIVE
Protein, ur: 100 mg/dL — AB
Specific Gravity, Urine: 1.015 (ref 1.005–1.030)
pH: 5 (ref 5.0–8.0)

## 2020-06-09 LAB — CBC
HCT: 34.1 % — ABNORMAL LOW (ref 39.0–52.0)
Hemoglobin: 11.4 g/dL — ABNORMAL LOW (ref 13.0–17.0)
MCH: 29.6 pg (ref 26.0–34.0)
MCHC: 33.4 g/dL (ref 30.0–36.0)
MCV: 88.6 fL (ref 80.0–100.0)
Platelets: 122 10*3/uL — ABNORMAL LOW (ref 150–400)
RBC: 3.85 MIL/uL — ABNORMAL LOW (ref 4.22–5.81)
RDW: 16 % — ABNORMAL HIGH (ref 11.5–15.5)
WBC: 5.8 10*3/uL (ref 4.0–10.5)
nRBC: 0 % (ref 0.0–0.2)

## 2020-06-09 LAB — GLUCOSE, CAPILLARY
Glucose-Capillary: 154 mg/dL — ABNORMAL HIGH (ref 70–99)
Glucose-Capillary: 153 mg/dL — ABNORMAL HIGH (ref 70–99)
Glucose-Capillary: 143 mg/dL — ABNORMAL HIGH (ref 70–99)
Glucose-Capillary: 143 mg/dL — ABNORMAL HIGH (ref 70–99)
Glucose-Capillary: 142 mg/dL — ABNORMAL HIGH (ref 70–99)
Glucose-Capillary: 185 mg/dL — ABNORMAL HIGH (ref 70–99)

## 2020-06-09 LAB — HEPATIC FUNCTION PANEL
ALT: 68 U/L — ABNORMAL HIGH (ref 0–44)
AST: 80 U/L — ABNORMAL HIGH (ref 15–41)
Albumin: 1.7 g/dL — ABNORMAL LOW (ref 3.5–5.0)
Alkaline Phosphatase: 93 U/L (ref 38–126)
Bilirubin, Direct: 0.1 mg/dL (ref 0.0–0.2)
Indirect Bilirubin: 0.8 mg/dL (ref 0.3–0.9)
Total Bilirubin: 0.9 mg/dL (ref 0.3–1.2)
Total Protein: 4.8 g/dL — ABNORMAL LOW (ref 6.5–8.1)

## 2020-06-09 LAB — MAGNESIUM: Magnesium: 2.5 mg/dL — ABNORMAL HIGH (ref 1.7–2.4)

## 2020-06-09 LAB — ECHOCARDIOGRAM COMPLETE
AR max vel: 2.53 cm2
AV Area VTI: 2.76 cm2
AV Area mean vel: 2.67 cm2
AV Mean grad: 4 mmHg
AV Peak grad: 6.7 mmHg
Ao pk vel: 1.29 m/s
Area-P 1/2: 4.68 cm2
Height: 66 in
Weight: 3347.46 oz

## 2020-06-09 LAB — CULTURE, RESPIRATORY W GRAM STAIN: Culture: NO GROWTH

## 2020-06-09 LAB — BASIC METABOLIC PANEL
Anion gap: 11 (ref 5–15)
BUN: 61 mg/dL — ABNORMAL HIGH (ref 8–23)
CO2: 19 mmol/L — ABNORMAL LOW (ref 22–32)
Calcium: 8.3 mg/dL — ABNORMAL LOW (ref 8.9–10.3)
Chloride: 112 mmol/L — ABNORMAL HIGH (ref 98–111)
Creatinine, Ser: 3.82 mg/dL — ABNORMAL HIGH (ref 0.61–1.24)
GFR, Estimated: 17 mL/min — ABNORMAL LOW (ref >60)
Glucose, Bld: 174 mg/dL — ABNORMAL HIGH (ref 70–99)
Potassium: 3.2 mmol/L — ABNORMAL LOW (ref 3.5–5.1)
Sodium: 142 mmol/L (ref 135–145)

## 2020-06-09 LAB — HIV ANTIBODY (ROUTINE TESTING W REFLEX): HIV Screen 4th Generation wRfx: NONREACTIVE

## 2020-06-09 LAB — PROTEIN / CREATININE RATIO, URINE
Creatinine, Urine: 62 mg/dL
Protein Creatinine Ratio: 2.55 mg/mg{Cre} — ABNORMAL HIGH (ref 0.00–0.15)
Total Protein, Urine: 158 mg/dL

## 2020-06-09 LAB — HEPATITIS C ANTIBODY: HCV Ab: NONREACTIVE

## 2020-06-09 LAB — URIC ACID: Uric Acid, Serum: 6.2 mg/dL (ref 3.7–8.6)

## 2020-06-09 LAB — HEPATITIS B SURFACE ANTIGEN: Hepatitis B Surface Ag: NONREACTIVE

## 2020-06-09 LAB — CULTURE, BLOOD (SINGLE)
Culture: NO GROWTH
Special Requests: ADEQUATE

## 2020-06-09 LAB — LEGIONELLA PNEUMOPHILA SEROGP 1 UR AG: L. pneumophila Serogp 1 Ur Ag: NEGATIVE

## 2020-06-09 LAB — PHOSPHORUS: Phosphorus: 5.9 mg/dL — ABNORMAL HIGH (ref 2.5–4.6)

## 2020-06-09 LAB — CK: Total CK: 506 U/L — ABNORMAL HIGH (ref 49–397)

## 2020-06-09 LAB — HEPATITIS B CORE ANTIBODY, TOTAL: Hep B Core Total Ab: NONREACTIVE

## 2020-06-09 LAB — HEPATITIS B SURFACE ANTIBODY,QUALITATIVE: Hep B S Ab: NONREACTIVE

## 2020-06-09 MED ORDER — POTASSIUM CHLORIDE 20 MEQ PO PACK
20.0000 meq | PACK | Freq: Once | ORAL | Status: AC
  Administered 2020-06-09: 20 meq
  Filled 2020-06-09: qty 1

## 2020-06-09 MED ORDER — SODIUM BICARBONATE 650 MG PO TABS
1300.0000 mg | ORAL_TABLET | Freq: Two times a day (BID) | ORAL | Status: DC
  Administered 2020-06-09 – 2020-06-19 (×21): 1300 mg
  Filled 2020-06-09 (×23): qty 2

## 2020-06-09 MED ORDER — SODIUM BICARBONATE 650 MG PO TABS
1300.0000 mg | ORAL_TABLET | Freq: Two times a day (BID) | ORAL | Status: DC
  Filled 2020-06-09: qty 2

## 2020-06-09 MED ORDER — ENOXAPARIN SODIUM 40 MG/0.4ML IJ SOSY
40.0000 mg | PREFILLED_SYRINGE | INTRAMUSCULAR | Status: DC
  Administered 2020-06-09: 40 mg via SUBCUTANEOUS
  Filled 2020-06-09: qty 0.4

## 2020-06-09 MED ORDER — SODIUM CHLORIDE 0.9 % IV SOLN
500.0000 mg | Freq: Two times a day (BID) | INTRAVENOUS | Status: AC
  Administered 2020-06-09 – 2020-06-11 (×4): 500 mg via INTRAVENOUS
  Filled 2020-06-09 (×2): qty 500
  Filled 2020-06-09: qty 0.5
  Filled 2020-06-09 (×2): qty 500
  Filled 2020-06-09: qty 0.5

## 2020-06-09 NOTE — Progress Notes (Signed)
NAME:  Wyatt Wandel., MRN:  [id number], DOB:  [birth year], LOS: 5 ADMISSION DATE:  06/04/2020 60 y.o.malewith medical history significant forHTN, PTSD, diabetes,migraines,GERD,neurogenic bladder secondary to remote history of transverse myelitis,  who self catheterizes and has frequent UTIsas well aschronic back pain on chronic opiates who at baselineis independent -brought to the ER with a 3-day history of altered mental status.  +cough,shortness of breathand feveras well asvomiting and diarrhea and started becoming increasingly confused in the 24 hours prior to arrival.   Unable to get any meaningful history from patient.  ED course: On arrival, temperature 99.3, BP 167/67, pulse 99, respirations 18 with O2 sat 96% on room air. Blood work significant for leukocytosis of 13,600 with lactic acid of 1.2. Ammonia level normal at 15, EtOH less than 10. Sodium 130, potassium 3.2, glucose 249 creatinine 1.06. Urinalysis with 80 ketones and rare bacteria. UDS positive for opiates and tricyclic's. COVID and flu negative    Imaging:   CT chest abdomen and pelvis without contrast consistent with pneumonia. Multiple nodules left upper lobe likely infectious or inflammatory with follow-up imaging recommended to document resolution. Cardiomegaly with trace pericardial effusion  Patient was started on sepsis fluids and broad-spectrum antibiotics initially for sepsis of unknown source.   Significant Hospital Events: Including procedures, antibiotic start and stop dates in addition to other pertinent events   5/31 admitted for sepsis present on admission for Left lung pneumonia  6/1 progressive confusion and hypoxia  6/2 transferred to [facility]  6/3 high fevers, remains ill, SELF EXTUBATED  6/3 emergently re-intubated   MICRO DATA COVID INF A/B NEG MRSA NEG BAL CULTURES PENDING    Antibiotics Given (last 72 hours)     Date/Time Action Medication Dose Rate   06/06/20 2257 New Bag/Given   azithromycin (ZITHROMAX) 500 mg in sodium chloride 0.9 % 250 mL IVPB 500 mg 250 mL/hr   06/07/20 0547 New Bag/Given   cefTRIAXone (ROCEPHIN) 2 g in sodium chloride 0.9 % 100 mL IVPB 2 g 200 mL/hr   06/07/20 1802 New Bag/Given   meropenem (MERREM) 1 g in sodium chloride 0.9 % 100 mL IVPB 1 g 200 mL/hr   06/07/20 2154 New Bag/Given   azithromycin (ZITHROMAX) 500 mg in sodium chloride 0.9 % 250 mL IVPB 500 mg 250 mL/hr   06/07/20 2225 New Bag/Given   meropenem (MERREM) 1 g in sodium chloride 0.9 % 100 mL IVPB 1 g 200 mL/hr   06/08/20 0540 New Bag/Given   meropenem (MERREM) 1 g in sodium chloride 0.9 % 100 mL IVPB 1 g 200 mL/hr   06/08/20 2157 New Bag/Given   meropenem (MERREM) 1 g in sodium chloride 0.9 % 100 mL IVPB 1 g 200 mL/hr   06/08/20 2238 New Bag/Given   azithromycin (ZITHROMAX) 500 mg in sodium chloride 0.9 % 250 mL IVPB 500 mg 250 mL/hr         Interim History / Subjective:  Remains critically ill Severe resp failure Remains on vent Therapy for pneumonia Progressive renal failure Severe hypoxia       Objective   Blood pressure (!) 143/70, pulse (!) 117, temperature 99.5 F (37.5 C), temperature source Esophageal, resp. rate 18, height _0  (1.676 m), weight 100.8 kg, SpO2 93 %.    Vent Mode: PRVC FiO2 (%):  [40 %] 40 % Set Rate:  [16 bmp] 16 bmp Vt Set:  [520 mL] 520 mL PEEP:  [10 cmH20] 10 cmH20 Plateau Pressure:  [17 ZYY48-25 cmH20]  20 cmH20   Intake/Output Summary (Last 24 hours) at 06/09/2020 0726 Last data filed at 06/09/2020 0600 Gross per 24 hour  Intake 2468.15 ml  Output 316 ml  Net 2152.15 ml   Filed Weights   06/06/20 0900 06/08/20 0500 06/09/20 0500  Weight: 95.6 kg 94.9 kg 100.8 kg      REVIEW OF SYSTEMS  PATIENT IS UNABLE TO PROVIDE COMPLETE REVIEW OF SYSTEMS DUE TO SEVERE CRITICAL ILLNESS AND TOXIC METABOLIC ENCEPHALOPATHY   PHYSICAL  EXAMINATION:  GENERAL:critically ill appearing, +resp distress intubated NECK: Supple. PULMONARY: +rhonchi, +wheezing CARDIOVASCULAR: S1 and S2. Regular rate and rhythm. No murmurs, rubs, or gallops.  GASTROINTESTINAL: Soft, nontender, -distended. Positive bowel sounds.  MUSCULOSKELETAL: No swelling, clubbing, or edema.  NEUROLOGIC: obtunded SKIN:intact,warm,dry   Labs/imaging that I havepersonally reviewed  (right click and "Reselect all SmartList Selections" daily)       ASSESSMENT AND PLAN SYNOPSIS   60 yo white make with acute and severe hypoxic respiratory failure with acute and severe toxic metabolic encephalopathy with acute COPD ExacerbationLUL/LL multifocal pneumonia Self extubated and re intubated emergently  Severe ACUTE Hypoxic and Hypercapnic Respiratory Failure -continue Mechanical Ventilator support -continue Bronchodilator Therapy -Wean Fio2 and PEEP as tolerated -VAP/VENT bundle implementation -will  NOT  perform SAT/SBT   SEVERE COPD EXACERBATION -continue IV steroids as prescribed -continue NEB THERAPY as prescribed  CARDIAC ICU monitoring   ACUTE KIDNEY INJURY/Renal Failure -continue Foley Catheter-assess need -Avoid nephrotoxic agents -Follow urine output, BMP -Ensure adequate renal perfusion, optimize oxygenation -Renal dose medications Nephrology consulted   NEUROLOGY Acute toxic metabolic encephalopathy, need for sedation Goal RASS -2 to -3   SEPTIC SHOCK -use vasopressors to keep MAP>65 as needed -follow ABG and LA -follow up cultures -emperic ABX -consider stress dose steroids -aggressive IV fluid resuscitation  INFECTIOUS DISEASE -continue antibiotics as prescribed -follow up cultures -follow up ID consultation MRSA NEG RVP NEGATIVE COVID NEG INF AB NEG SPUTUM CX NG AFB SMEAR NEG HIV NEG   ENDO - ICU hypoglycemic\Hyperglycemia protocol -check FSBS per protocol   GI GI PROPHYLAXIS as indicated  NUTRITIONAL  STATUS DIET-->TF's as tolerated Constipation protocol as indicated   ELECTROLYTES -follow labs as needed -replace as needed -pharmacy consultation and following     Best practice (right click and "Reselect all SmartList Selections" daily)  Diet:NPO Pain/Anxiety/Delirium protocol (if indicated):Yes(RASS goal -2) VAP protocol (if indicated):Yes DVT prophylaxis:Subcutaneous Heparin GI prophylaxis:H2B Glucose control:SSIYes Central venous access:N/A Arterial line:N/A Foley:N/A Mobility:bed rest PT consulted:N/A Code Status:full code Disposition:ICU      Labs   CBC: Recent Labs  Lab 06/05/20 0716 06/06/20 1918 06/07/20 0516 06/08/20 0534 06/09/20 0511  WBC 12.3* 12.7* 7.7 7.0 5.8  HGB 14.6 15.1 12.8* 11.8* 11.4*  HCT 43.1 43.6 37.2* 35.6* 34.1*  MCV 86.2 85.3 84.7 90.4 88.6  PLT 132* 191 150 128* 122*    Basic Metabolic Panel: Recent Labs  Lab 06/05/20 0838 06/06/20 1918 06/07/20 0516 06/07/20 1710 06/08/20 0534 06/08/20 1729 06/09/20 0511  NA 133* 140 143  --  142  --  142  K 3.2* 3.1* 2.8* 3.3* 3.3*  --  3.2*  CL 100 106 111  --  112*  --  112*  CO2 18* 21* 23  --  22  --  19*  GLUCOSE 240* 240* 153*  --  199*  --  174*  BUN 21 23 29*  --  42*  --  61*  CREATININE 1.06 1.05 1.38*  --  2.16*  --  3.82*  CALCIUM 8.3* 8.4* 8.3*  --  8.3*  --  8.3*  MG  --  2.1  --  2.2 2.4 2.5* 2.5*  PHOS  --   --   --  2.7 4.0 4.3 5.9*   GFR: Estimated Creatinine Clearance: 22.6 mL/min (A) (by C-G formula based on SCr of 3.82 mg/dL (H)). Recent Labs  Lab 06/04/20 1718 06/04/20 2148 06/05/20 0716 06/05/20 0729 06/05/20 8811 06/06/20 1918 06/07/20 0516 06/08/20 0534 06/09/20 0511  PROCALCITON  --  0.45  --   --  4.33  --   --   --   --   WBC 13.6*  --    < >  --   --  12.7* 7.7 7.0 5.8  LATICACIDVEN 1.2 3.1*  --  1.9 1.5  --   --   --   --    < > = values in this interval not displayed.    Liver Function Tests: Recent Labs   Lab 06/04/20 1718 06/06/20 1918  AST 14* 191*  ALT 11 110*  ALKPHOS 61 68  BILITOT 1.0 1.4*  PROT 6.9 6.7  ALBUMIN 3.4* 2.9*   No results for input(s): LIPASE, AMYLASE in the last 168 hours. Recent Labs  Lab 06/04/20 1718  AMMONIA 15    ABG    Component Value Date/Time   PHART 7.33 (L) 06/08/2020 0500   PCO2ART 40 06/08/2020 0500   PO2ART 99 06/08/2020 0500   HCO3 21.1 06/08/2020 0500   ACIDBASEDEF 4.5 (H) 06/08/2020 0500   O2SAT 97.2 06/08/2020 0500     Coagulation Profile: Recent Labs  Lab 06/05/20 0838  INR 1.2    Cardiac Enzymes: Recent Labs  Lab 06/06/20 1619  CKTOTAL 85    HbA1C: Hgb A1c MFr Bld  Date/Time Value Ref Range Status  06/04/2020 09:48 PM 7.4 (H) 4.8 - 5.6 % Final    Comment:    (NOTE)         Prediabetes: 5.7 - 6.4         Diabetes: >6.4         Glycemic control for adults with diabetes: <7.0     CBG: Recent Labs  Lab 06/08/20 1145 06/08/20 1541 06/08/20 1914 06/08/20 2345 06/09/20 0351  GLUCAP 187* 178* 149* 171* 154*    Allergies No Known Allergies     DVT/GI PRX  assessed I Assessed the need for Labs I Assessed the need for Foley I Assessed the need for Central Venous Line Family Discussion when available I Assessed the need for Mobilization I made an Assessment of medications to be adjusted accordingly Safety Risk assessment completed      Critical Care Time devoted to patient care services described in this note is 60 minutes.  Critical care was necessary to treat or prevent imminent or life-threatening deterioration. Overall, patient is critically ill, prognosis is guarded.  Patient with Multiorgan failure and at high risk for cardiac arrest and death.    Corrin Parker, M.D.  [facility]  Medical Director [facility] Director [facility] Cardio-Pulmonary Department

## 2020-06-09 NOTE — Consult Note (Signed)
[facility] Consult Note: 06/09/2020    Date of Admission:  06/04/2020           Reason for Consult: Acute kidney injury    Referring Provider: Flora Lipps, MD Primary Care Provider: System, Provider Not In   History of Presenting Illness:  Wyatt Ball. is a 60 y.o. male  Patient was brought to the emergency room on May 31 for altered mental status for 3 days prior to admission.  Per notes, normally he is alert and oriented.  He was noted to be febrile in the ER. Patient has medical problems of hypertension, PTSD, diabetes, migraines, GERD, neurogenic bladder secondary to remote history of transverse myelitis, requires self-catheterization and has frequent UTIs, requires chronic opiates for pain control. Patient was diagnosed with sepsis secondary to multifocal pneumonia and admitted for further evaluation and management A Coloplast including catheter was placed by urologist on June 1st, due to difficulty with Foley catheter placement On June 2, patient's clinical condition worsened and he was transferred to ICU for closer monitoring. He was intubated on the evening of June 2.  He underwent emergent bronchoscopy.  Thin blood-tinged secretions were noted, minimal mucous plugs, airways within normal limits no edema or inflammation noted.  No purulent secretions noted.  Pertinent labs: Baseline creatinine of 1.05 at admission Progressively increased since June 2 and has worsened to 3.82 today Potassium is low at 3.2 Patient is acidotic with bicarb of 19 Patient had IV contrast exposure on June 3 in the form of CT Angiogram.  Study was negative for pulmonary embolus.  Marked worsening of airspace disease on the left was noted consistent with progressive pneumonia.  Cardiomegaly and aortic atherosclerosis were also noted. 2D echo performed on June 2.  Results are pending  Currently, patient is intubated and sedated.  Unable to provide any information.  Nurse  at bedside.  Reports that fever curve is improving.  He is sedated with propofol and fentanyl.  He has a Foley catheter and rectal tube Not requiring pressors Tube feeds are going at 25 cc/h He is tachycardic with regular rhythm  Review of Systems: Unavailable due to critical illness/intubated sedated ROS  Past Medical History:  Diagnosis Date  . Anxiety   . DM (diabetes mellitus) ([place])   . HTN (hypertension)   . Transverse myelitis ([place])     Social History   Tobacco Use  . Smoking status: Current Every Day Smoker  . Smokeless tobacco: Never Used  Substance Use Topics  . Alcohol use: Never    Family History  Problem Relation Age of Onset  . CAD Mother   . Diabetes Mother   . CAD Father   . Diabetes Father   . CAD Brother   . Diabetes Brother      OBJECTIVE: Blood pressure (!) 143/70, pulse (!) 117, temperature 99.5 F (37.5 C), temperature source Esophageal, resp. rate 18, height _0  (1.676 m), weight 100.8 kg, SpO2 93 %.  Physical Exam   Physical Exam: General:  No acute distress, laying in the bed, critically ill-appearing  HEENT  anicteric, moist oral mucous membrane, ETT, OG tube  Pulm/lungs  coarse breath sounds, ventilator assisted  CVS/Heart  regular rhythm, tachycardic  Abdomen:   Soft, nontender  Extremities:  No peripheral edema  Neurologic:  Sedated  Skin:  No acute rashes  Coud catheter in place Rectal tube in place   Lab Results Lab Results  Component Value Date   WBC 5.8 06/09/2020  HGB 11.4 (L) 06/09/2020   HCT 34.1 (L) 06/09/2020   MCV 88.6 06/09/2020   PLT 122 (L) 06/09/2020    Lab Results  Component Value Date   CREATININE 3.82 (H) 06/09/2020   BUN 61 (H) 06/09/2020   NA 142 06/09/2020   K 3.2 (L) 06/09/2020   CL 112 (H) 06/09/2020   CO2 19 (L) 06/09/2020    Lab Results  Component Value Date   ALT 68 (H) 06/09/2020   AST 80 (H) 06/09/2020   ALKPHOS 93 06/09/2020   BILITOT 0.9 06/09/2020     Microbiology: Recent  Results (from the past 240 hour(s))  Blood culture (single)     Status: Abnormal (Preliminary result)   Collection Time: 06/04/20  5:18 PM   Specimen: BLOOD  Result Value Ref Range Status   Specimen Description   Final    BLOOD BLOOD RIGHT HAND Performed at [facility], [address], [place], [place] [address]    Special Requests   Final    BOTTLES DRAWN AEROBIC AND ANAEROBIC Blood Culture adequate volume Performed at [facility], [address] [place], [place] [address]    Culture  Setup Time   Final    GRAM POSITIVE COCCI IN BOTH AEROBIC AND ANAEROBIC BOTTLES CRITICAL RESULT CALLED TO, READ BACK BY AND VERIFIED WITH: Elvera Maria [id number] 06/06/2020 DLB Performed at [facility], [address] [place], [place] [address]    Culture (A)  Final    STAPHYLOCOCCUS EPIDERMIDIS THE SIGNIFICANCE OF ISOLATING THIS ORGANISM FROM A SINGLE SET OF BLOOD CULTURES WHEN MULTIPLE SETS ARE DRAWN IS UNCERTAIN. PLEASE NOTIFY THE MICROBIOLOGY DEPARTMENT WITHIN ONE WEEK IF SPECIATION AND SENSITIVITIES ARE REQUIRED. MICROCOCCUS SPECIES Standardized susceptibility testing for this organism is not available. Performed at [facility], [place] [address] [place], [place] [address]    Report Status PENDING  Incomplete  Resp Panel by RT-PCR (Flu A&B, Covid) Nasopharyngeal Swab     Status: None   Collection Time: 06/04/20  5:18 PM   Specimen: Nasopharyngeal Swab; Nasopharyngeal(NP) swabs in vial transport medium  Result Value Ref Range Status   SARS Coronavirus 2 by RT PCR NEGATIVE NEGATIVE Final    Comment: (NOTE) SARS-CoV-2 target nucleic acids are NOT DETECTED.  The SARS-CoV-2 RNA is generally detectable in upper respiratory specimens during the acute phase of infection. The lowest concentration of SARS-CoV-2 viral copies this assay can detect is 138 copies/mL. A negative result does not preclude SARS-Cov-2 infection and should not be used as the sole basis for  treatment or other patient management decisions. A negative result may occur with  improper specimen collection/handling, submission of specimen other than nasopharyngeal swab, presence of viral mutation(s) within the areas targeted by this assay, and inadequate number of viral copies(<138 copies/mL). A negative result must be combined with clinical observations, patient history, and epidemiological information. The expected result is Negative.  Fact Sheet for Patients:  EntrepreneurPulse.com.au  Fact Sheet for Healthcare Providers:  IncredibleEmployment.be  This test is no t yet approved or cleared by the [place] FDA and  has been authorized for detection and/or diagnosis of SARS-CoV-2 by FDA under an Emergency Use Authorization (EUA). This EUA will remain  in effect (meaning this test can be used) for the duration of the COVID-19 declaration under Section 564(b)(1) of the Act, 21 U.S.C.section 360bbb-3(b)(1), unless the authorization is terminated  or revoked sooner.       Influenza A by PCR NEGATIVE NEGATIVE Final  Influenza B by PCR NEGATIVE NEGATIVE Final    Comment: (NOTE) The Xpert Xpress SARS-CoV-2/FLU/RSV plus assay is intended as an aid in the diagnosis of influenza from Nasopharyngeal swab specimens and should not be used as a sole basis for treatment. Nasal washings and aspirates are unacceptable for Xpert Xpress SARS-CoV-2/FLU/RSV testing.  Fact Sheet for Patients: EntrepreneurPulse.com.au  Fact Sheet for Healthcare Providers: IncredibleEmployment.be  This test is not yet approved or cleared by the [place] FDA and has been authorized for detection and/or diagnosis of SARS-CoV-2 by FDA under an Emergency Use Authorization (EUA). This EUA will remain in effect (meaning this test can be used) for the duration of the COVID-19 declaration under Section 564(b)(1) of the Act, 21  U.S.C. section 360bbb-3(b)(1), unless the authorization is terminated or revoked.  Performed at [facility], [address] [place], [place] [address]   Blood Culture ID Panel (Reflexed)     Status: Abnormal   Collection Time: 06/04/20  5:18 PM  Result Value Ref Range Status   Enterococcus faecalis NOT DETECTED NOT DETECTED Final   Enterococcus Faecium NOT DETECTED NOT DETECTED Final   Listeria monocytogenes NOT DETECTED NOT DETECTED Final   Staphylococcus species DETECTED (A) NOT DETECTED Final    Comment: CRITICAL RESULT CALLED TO, READ BACK BY AND VERIFIED WITH: [facility] MITCHELL [id number] 06/06/2020 DLB    Staphylococcus aureus (BCID) NOT DETECTED NOT DETECTED Final   Staphylococcus epidermidis DETECTED (A) NOT DETECTED Final    Comment: CRITICAL RESULT CALLED TO, READ BACK BY AND VERIFIED WITH: Elvera Maria [id number] 06/06/2020 DLB    Staphylococcus lugdunensis NOT DETECTED NOT DETECTED Final   Streptococcus species NOT DETECTED NOT DETECTED Final   Streptococcus agalactiae NOT DETECTED NOT DETECTED Final   Streptococcus pneumoniae NOT DETECTED NOT DETECTED Final   Streptococcus pyogenes NOT DETECTED NOT DETECTED Final   A.calcoaceticus-baumannii NOT DETECTED NOT DETECTED Final   Bacteroides fragilis NOT DETECTED NOT DETECTED Final   Enterobacterales NOT DETECTED NOT DETECTED Final   Enterobacter cloacae complex NOT DETECTED NOT DETECTED Final   Escherichia coli NOT DETECTED NOT DETECTED Final   Klebsiella aerogenes NOT DETECTED NOT DETECTED Final   Klebsiella oxytoca NOT DETECTED NOT DETECTED Final   Klebsiella pneumoniae NOT DETECTED NOT DETECTED Final   Proteus species NOT DETECTED NOT DETECTED Final   Salmonella species NOT DETECTED NOT DETECTED Final   Serratia marcescens NOT DETECTED NOT DETECTED Final   Haemophilus influenzae NOT DETECTED NOT DETECTED Final   Neisseria meningitidis NOT DETECTED NOT DETECTED Final   Pseudomonas aeruginosa NOT DETECTED NOT DETECTED  Final   Stenotrophomonas maltophilia NOT DETECTED NOT DETECTED Final   Candida albicans NOT DETECTED NOT DETECTED Final   Candida auris NOT DETECTED NOT DETECTED Final   Candida glabrata NOT DETECTED NOT DETECTED Final   Candida krusei NOT DETECTED NOT DETECTED Final   Candida parapsilosis NOT DETECTED NOT DETECTED Final   Candida tropicalis NOT DETECTED NOT DETECTED Final   Cryptococcus neoformans/gattii NOT DETECTED NOT DETECTED Final   Methicillin resistance mecA/C NOT DETECTED NOT DETECTED Final    Comment: Performed at [facility], [address] [place], [place] [address]  Blood culture (single)     Status: None   Collection Time: 06/04/20  9:48 PM   Specimen: BLOOD  Result Value Ref Range Status   Specimen Description BLOOD BLOOD RIGHT ARM  Final   Special Requests   Final    BOTTLES DRAWN AEROBIC AND ANAEROBIC Blood Culture adequate volume   Culture  Final    NO GROWTH 5 DAYS Performed at [facility], [address] [place], [place] [address]    Report Status 06/09/2020 FINAL  Final  Urine Culture     Status: None   Collection Time: 06/06/20  2:37 PM   Specimen: Urine, Random  Result Value Ref Range Status   Specimen Description   Final    URINE, RANDOM Performed at [facility], [address], [place], [place] [address]    Special Requests   Final    NONE Performed at [facility], [address] [place], [place] [address]    Culture   Final    NO GROWTH Performed at [facility], [place] [address] [place], [place] [address]    Report Status 06/08/2020 FINAL  Final  MRSA PCR Screening     Status: None   Collection Time: 06/06/20  2:40 PM   Specimen: Nasopharyngeal  Result Value Ref Range Status   MRSA by PCR NEGATIVE NEGATIVE Final    Comment:        The GeneXpert MRSA Assay (FDA approved for NASAL specimens only), is one component of a comprehensive MRSA colonization surveillance program. It is  not intended to diagnose MRSA infection nor to guide or monitor treatment for MRSA infections. Performed at [facility], [place], [place] [address]   Acid Fast Smear (AFB)     Status: None   Collection Time: 06/06/20  6:30 PM   Specimen: Bronchoalveolar Lavage; Sputum  Result Value Ref Range Status   AFB Specimen Processing Concentration  Final   Acid Fast Smear Negative  Final    Comment: (NOTE) Performed At: [facility] [place], [place] [id number] Rush Farmer MD FG:[id number]    Source (AFB) BRONCHIAL ALVEOLAR LAVAGE  Corrected    Comment: Performed at [facility], [address] [place], [place] [address] CORRECTED ON 06/02 AT 2239: PREVIOUSLY REPORTED AS SPUTUM   Culture, Respiratory w Gram Stain     Status: None (Preliminary result)   Collection Time: 06/06/20  6:30 PM   Specimen: Bronchoalveolar Lavage  Result Value Ref Range Status   Specimen Description   Final    BRONCHIAL ALVEOLAR LAVAGE Performed at [facility], [address], [place], [place] [address]    Special Requests   Final    NONE Performed at [facility], [address] [place], [place] [address]    Gram Stain   Final    FEW WBC PRESENT,BOTH PMN AND MONONUCLEAR NO ORGANISMS SEEN    Culture   Final    NO GROWTH 2 DAYS Performed at [facility], [place] [address] [place],  [address]    Report Status PENDING  Incomplete  Respiratory (~20 pathogens) panel by PCR     Status: None   Collection Time: 06/07/20  5:50 AM   Specimen: Nasopharyngeal Swab; Respiratory  Result Value Ref Range Status   Adenovirus NOT DETECTED NOT DETECTED Final   Coronavirus 229E NOT DETECTED NOT DETECTED Final    Comment: (NOTE) The Coronavirus on the Respiratory Panel, DOES NOT test for the novel  Coronavirus (2019 nCoV)    Coronavirus HKU1 NOT DETECTED NOT DETECTED Final   Coronavirus NL63 NOT DETECTED NOT DETECTED Final    Coronavirus OC43 NOT DETECTED NOT DETECTED Final   Metapneumovirus NOT DETECTED NOT DETECTED Final   Rhinovirus / Enterovirus NOT DETECTED NOT DETECTED Final   Influenza A NOT  DETECTED NOT DETECTED Final   Influenza B NOT DETECTED NOT DETECTED Final   Parainfluenza Virus 1 NOT DETECTED NOT DETECTED Final   Parainfluenza Virus 2 NOT DETECTED NOT DETECTED Final   Parainfluenza Virus 3 NOT DETECTED NOT DETECTED Final   Parainfluenza Virus 4 NOT DETECTED NOT DETECTED Final   Respiratory Syncytial Virus NOT DETECTED NOT DETECTED Final   Bordetella pertussis NOT DETECTED NOT DETECTED Final   Bordetella Parapertussis NOT DETECTED NOT DETECTED Final   Chlamydophila pneumoniae NOT DETECTED NOT DETECTED Final   Mycoplasma pneumoniae NOT DETECTED NOT DETECTED Final    Comment: Performed at [facility], [place] [address] [place], [place] [address]    Medications: Scheduled Meds: . chlorhexidine gluconate (MEDLINE KIT)  15 mL Mouth Rinse BID  . Chlorhexidine Gluconate Cloth  6 each Topical Daily  . enoxaparin (LOVENOX) injection  0.5 mg/kg Subcutaneous Q24H  . feeding supplement (PROSource TF)  90 mL Per Tube QID  . feeding supplement (VITAL HIGH PROTEIN)  1,000 mL Per Tube Q24H  . free water  100 mL Per Tube Q4H  . insulin aspart  0-15 Units Subcutaneous Q4H  . mouth rinse  15 mL Mouth Rinse 10 times per day  . multivitamin with minerals  1 tablet Per Tube Daily  . pantoprazole (PROTONIX) IV  40 mg Intravenous Q24H  . prazosin  4 mg Per Tube QHS  . sodium chloride flush  10-40 mL Intracatheter Q12H  . traZODone  150 mg Per Tube QHS   Continuous Infusions: . azithromycin Stopped (06/08/20 2340)  . fentaNYL infusion INTRAVENOUS 300 mcg/hr (06/09/20 0600)  . meropenem (MERREM) IV Stopped (06/08/20 2227)  . propofol (DIPRIVAN) infusion 45 mcg/kg/min (06/09/20 0600)   PRN Meds:.fentaNYL, labetalol, midazolam, ondansetron **OR** ondansetron (ZOFRAN) IV, sodium chloride flush  No Known  Allergies  Urinalysis: No results for input(s): COLORURINE, LABSPEC, PHURINE, GLUCOSEU, HGBUR, BILIRUBINUR, KETONESUR, PROTEINUR, UROBILINOGEN, NITRITE, LEUKOCYTESUR in the last 72 hours.  Invalid input(s): APPERANCEUR    Imaging: DG Abd 1 View  Result Date: 06/07/2020 CLINICAL DATA:  OG placement EXAM: ABDOMEN - 1 VIEW COMPARISON:  06/06/2020 FINDINGS: Gastric tube enters the stomach with the tip near the antrum. Normal bowel gas pattern. No significant change from yesterday. IMPRESSION: Gastric tube tip in the antrum.  Normal bowel gas pattern. Electronically Signed   By: Franchot Gallo M.D.   On: 06/07/2020 10:09   CT ANGIO CHEST PE W OR WO CONTRAST  Result Date: 06/07/2020 CLINICAL DATA:  Acute respiratory failure.  Pneumonia. EXAM: CT ANGIOGRAPHY CHEST WITH CONTRAST TECHNIQUE: Multidetector CT imaging of the chest was performed using the standard protocol during bolus administration of intravenous contrast. Multiplanar CT image reconstructions and MIPs were obtained to evaluate the vascular anatomy. CONTRAST:  75 mL OMNIPAQUE IOHEXOL 350 MG/ML SOLN COMPARISON:  Single-view of the chest 06/07/2020, 06/06/2020 and 06/04/2020. CT chest, abdomen and pelvis 06/04/2020. FINDINGS: Cardiovascular: No pulmonary embolus. There is cardiomegaly. No aortic aneurysm. Calcific aortic atherosclerosis noted. Mediastinum/Nodes: No enlarged mediastinal, hilar, or axillary lymph nodes. Thyroid gland, trachea, and esophagus demonstrate no significant findings. Lungs/Pleura: Small to moderate left apical pleural effusion is seen. Dense airspace disease is present in the left upper and lower lobes. The appearance is much worse than on the prior CT. Small nodular densities in the left upper lobe seen on the prior CT are not visible today and likely obscured by pleural fluid and consolidation. Minimal dependent atelectasis on the left. Upper Abdomen: No acute or focal abnormality. Musculoskeletal:  Negative. Review of the  MIP images confirms the above findings. IMPRESSION: Negative for pulmonary embolus. Marked worsening of airspace disease in the left chest most consistent with progressive pneumonia. New small to moderate left pleural effusion is present. Cardiomegaly. Aortic Atherosclerosis (ICD10-I70.0). Electronically Signed   By: Inge Rise M.D.   On: 06/07/2020 11:50   MR BRAIN W WO CONTRAST  Result Date: 06/07/2020 CLINICAL DATA:  Provided history: Neuro deficit, acute, stroke suspected. Additional history provided: 3 days of altered mental status, cough, shortness of breath and fever with vomiting and diarrhea, increasing confusion. EXAM: MRI HEAD WITHOUT AND WITH CONTRAST TECHNIQUE: Multiplanar, multiecho pulse sequences of the brain and surrounding structures were obtained without and with intravenous contrast. CONTRAST:  31m GADAVIST GADOBUTROL 1 MMOL/ML IV SOLN COMPARISON:  Prior head CT examinations 06/04/2020 and earlier. FINDINGS: Brain: Intermittently motion degraded examination, limiting evaluation. Most notably, there is mild motion degradation of the axial diffusion-weighted sequence, moderate motion degradation of the sagittal T1 weighted sequence, mild-to-moderate motion degradation of the axial T1 postcontrast sequence, moderate motion degradation of the coronal T1 postcontrast sequence and moderate/severe motion degradation of the sagittal T1 postcontrast sequence. Mild-to-moderate generalized parenchymal atrophy. No cortical encephalomalacia is identified. No significant white matter disease. There is no acute infarct. No evidence of intracranial mass. No chronic intracranial blood products. No extra-axial fluid collection. No midline shift. No abnormal intracranial enhancement. Vascular: Expected proximal arterial flow voids. Skull and upper cervical spine: No focal marrow lesion is identified Sinuses/Orbits: Visualized orbits show no acute finding. Mild bilateral ethmoid, sphenoid and maxillary  sinus mucosal thickening. IMPRESSION: Motion degraded examination, as described and limiting evaluation. No evidence of acute intracranial abnormality. Mild-to-moderate generalized parenchymal atrophy. Mild paranasal sinus mucosal thickening. Electronically Signed   By: KKellie SimmeringDO   On: 06/07/2020 17:53   DG Chest Port 1 View  Result Date: 06/07/2020 CLINICAL DATA:  Status post intubation and OG placement EXAM: PORTABLE CHEST 1 VIEW COMPARISON:  06/06/2020 FINDINGS: Endotracheal tube remains in good position. Gastric tube enters the stomach. Dense consolidation in the left lower lobe is unchanged. Small left effusion. Right lung clear. IMPRESSION: Endotracheal tube in good position Dense consolidation left lower lobe unchanged Electronically Signed   By: CFranchot GalloM.D.   On: 06/07/2020 10:11   [place] SITE RITE  Result Date: 06/07/2020 If Site Rite image not attached, placement could not be confirmed due to current cardiac rhythm.     Assessment/Plan:  ADelrae AlfredMeierdiercks JBrooke Bonito is a 60y.o. male with medical problems of   Hypertension, PTSD, diabetes, migraines, GERD, neurogenic bladder secondary to remote history of transverse myelitis, requires self-catheterization and has frequent UTIs, opioids for chronic pain   was admitted on 06/04/2020 for :  Sepsis ([place] [A41.9] Altered mental status, unspecified altered mental status type [R41.82] Community acquired pneumonia, unspecified laterality [J18.9]  #Acute kidney injury, proteinuria, hematuria Baseline creatinine of 1.26 at admission Increasing creatinine trends since June 3. AKI is likely multifactorial secondary to ongoing sepsis as well as IV contrast exposure on June 3 Urinalysis at admission showed glucosuria, greater than 300 proteinuria, 6-10 RBCs, moderate hemoglobin noted on dipstick, 11-20 WBCs Renal imaging as noted above.  Patient had CT abdomen pelvis without contrast.  No hydronephrosis.  Bladder was  unremarkable.  Plan: Will obtain repeat urinalysis, urine protein to creatinine ratio In context of hematuria and lung disease, will work-up for vasculitis Urine output is dwindling Electrolytes and volume status are acceptable.  No acute indication for  dialysis at present but will monitor daily. Will start low dose bicarb supplement  #Acute respiratory failure Currently requiring ventilator support FiO2 40%, PEEP 10   Mujtaba Bollig 06/09/20

## 2020-06-09 NOTE — Plan of Care (Signed)
Neuro: stable on sedation, weak cough reflex present with deep suctioning Resp: stable on ventilator settings CV: afebrile, vital signs stable, edema increasing GIGU: coude foley in place, flexiseal in place, OG with tube feeds-all well tolerated Skin: red, mottled, pale and intact Social: Brother and niece visited today, all questions and concerns addressed.   Problem: Education: Goal: Knowledge of General Education information will improve Description: Including pain rating scale, medication(s)/side effects and non-pharmacologic comfort measures Outcome: Not Progressing   Problem: Health Behavior/Discharge Planning: Goal: Ability to manage health-related needs will improve Outcome: Not Progressing   Problem: Clinical Measurements: Goal: Ability to maintain clinical measurements within normal limits will improve Outcome: Not Progressing Goal: Will remain free from infection Outcome: Not Progressing Goal: Diagnostic test results will improve Outcome: Not Progressing Goal: Respiratory complications will improve Outcome: Not Progressing Goal: Cardiovascular complication will be avoided Outcome: Not Progressing   Problem: Activity: Goal: Risk for activity intolerance will decrease Outcome: Not Progressing   Problem: Nutrition: Goal: Adequate nutrition will be maintained Outcome: Not Progressing   Problem: Coping: Goal: Level of anxiety will decrease Outcome: Not Progressing   Problem: Elimination: Goal: Will not experience complications related to bowel motility Outcome: Not Progressing Goal: Will not experience complications related to urinary retention Outcome: Not Progressing   Problem: Pain Managment: Goal: General experience of comfort will improve Outcome: Not Progressing   Problem: Safety: Goal: Ability to remain free from injury will improve Outcome: Not Progressing   Problem: Skin Integrity: Goal: Risk for impaired skin integrity will decrease Outcome:  Not Progressing

## 2020-06-09 NOTE — Consult Note (Signed)
PHARMACY CONSULT NOTE  Pharmacy Consult for Electrolyte Monitoring and Replacement   Recent Labs: Potassium (mmol/L)  Date Value  06/09/2020 3.2 (L)   Magnesium (mg/dL)  Date Value  70/17/7939 2.5 (H)   Calcium (mg/dL)  Date Value  03/00/9233 8.3 (L)   Albumin (g/dL)  Date Value  00/76/2263 1.7 (L)   Phosphorus (mg/dL)  Date Value  33/54/5625 5.9 (H)   Sodium (mmol/L)  Date Value  06/09/2020 142   Assessment: Patient is a 60 y/o M with medical history including diabetes, migraines, GERD, neurogenic bladder, HTN, chronic opioid use who is admitted with sepsis secondary to pneumonia. Patient is admitted to the ICU and is currently intubated, sedated, and on mechanical ventilation.   Pharmacy consulted to assist with electrolyte monitoring and replacement as indicated.  Nutrition: Tube feeds @ 25 mL/hr + free water flushes 100 mL q4h (600 mL/day)  Scr trending up (1.06 > 1.38 > 2.16>3.82), also with transaminitis Phos now elevated @ 5.9  Goal of Therapy:  Electrolytes within normal limits  Plan:  --K 3.2 - ordered KCl 20 mEq PO x 1 dose (still being cautious per deterioration of renal function)  --Monitor electrolytes with AM labs  Albina Billet, PharmD, BCPS Clinical Pharmacist 06/09/2020 9:26 AM

## 2020-06-09 NOTE — Progress Notes (Signed)
GOALS OF CARE DISCUSSION  The Clinical status was relayed to family in detail. Brother at bedside  Updated and notified of patients medical condition.    Patient remains unresponsive and will not open eyes to command.   Patient is having a weak cough and struggling to remove secretions.   Patient with increased WOB and using accessory muscles to breathe Explained to family course of therapy and the modalities    Patient with Progressive multiorgan failure with a very high probablity of a very minimal chance of meaningful recovery despite all aggressive and optimal medical therapy.  PATIENT REMAINS FULL CODE  Family understands the situation. Patient with multiorgan failure Nephrology consulted I anticipate prolonged ICU LOS   Family are satisfied with Plan of action and management. All questions answered  Additional CC time 35 mins   Cloee Dunwoody Santiago Glad, M.D.  [facility]  Medical Director [facility] Medical Director [facility] Cardio-Pulmonary Department

## 2020-06-09 NOTE — Progress Notes (Signed)
PHARMACY NOTE:  ANTIMICROBIAL RENAL DOSAGE ADJUSTMENT  Current antimicrobial regimen includes a mismatch between antimicrobial dosage and estimated renal function.  As per policy approved by the Pharmacy & Therapeutics and Medical Executive Committees, the antimicrobial dosage will be adjusted accordingly.  Current antimicrobial dosage:  Meropenem 1g q12h  Indication: PNA  Renal Function:  Estimated Creatinine Clearance: 22.6 mL/min (A) (by C-G formula based on SCr of 3.82 mg/dL (H)). []      On intermittent HD, scheduled: []      On CRRT    Antimicrobial dosage has been changed to:  Meropenem 500mg  q12  Additional comments:   Thank you for allowing pharmacy to be a part of this patient's care.  , PharmD, BCPS Clinical Pharmacist 06/09/2020 9:22 AM

## 2020-06-10 ENCOUNTER — Inpatient Hospital Stay: Payer: No Typology Code available for payment source

## 2020-06-10 DIAGNOSIS — J189 Pneumonia, unspecified organism: Secondary | ICD-10-CM | POA: Diagnosis not present

## 2020-06-10 DIAGNOSIS — A419 Sepsis, unspecified organism: Secondary | ICD-10-CM | POA: Diagnosis not present

## 2020-06-10 DIAGNOSIS — G9341 Metabolic encephalopathy: Secondary | ICD-10-CM | POA: Diagnosis not present

## 2020-06-10 DIAGNOSIS — R4182 Altered mental status, unspecified: Secondary | ICD-10-CM | POA: Diagnosis not present

## 2020-06-10 LAB — BLOOD GAS, ARTERIAL
Acid-base deficit: 2.7 mmol/L — ABNORMAL HIGH (ref 0.0–2.0)
Acid-base deficit: 4.5 mmol/L — ABNORMAL HIGH (ref 0.0–2.0)
Bicarbonate: 22.6 mmol/L (ref 20.0–28.0)
Bicarbonate: 21.1 mmol/L (ref 20.0–28.0)
FIO2: 0.4
FIO2: 0.4
O2 Saturation: 97.8 %
O2 Saturation: 97.2 %
Patient temperature: 37
Patient temperature: 37
RATE: 16 resp/min
pCO2 arterial: 40 mmHg (ref 32.0–48.0)
pCO2 arterial: 40 mmHg (ref 32.0–48.0)
pH, Arterial: 7.36 (ref 7.350–7.450)
pH, Arterial: 7.33 — ABNORMAL LOW (ref 7.350–7.450)
pO2, Arterial: 104 mmHg (ref 83.0–108.0)
pO2, Arterial: 99 mmHg (ref 83.0–108.0)

## 2020-06-10 LAB — KAPPA/LAMBDA LIGHT CHAINS
Kappa free light chain: 116.6 mg/L — ABNORMAL HIGH (ref 3.3–19.4)
Kappa, lambda light chain ratio: 1.17 (ref 0.26–1.65)
Lambda free light chains: 99.7 mg/L — ABNORMAL HIGH (ref 5.7–26.3)

## 2020-06-10 LAB — CULTURE, BLOOD (SINGLE): Special Requests: ADEQUATE

## 2020-06-10 LAB — RENAL FUNCTION PANEL
Albumin: 1.6 g/dL — ABNORMAL LOW (ref 3.5–5.0)
Anion gap: 12 (ref 5–15)
BUN: 81 mg/dL — ABNORMAL HIGH (ref 8–23)
CO2: 18 mmol/L — ABNORMAL LOW (ref 22–32)
Calcium: 8 mg/dL — ABNORMAL LOW (ref 8.9–10.3)
Chloride: 111 mmol/L (ref 98–111)
Creatinine, Ser: 5.47 mg/dL — ABNORMAL HIGH (ref 0.61–1.24)
GFR, Estimated: 11 mL/min — ABNORMAL LOW (ref >60)
Glucose, Bld: 154 mg/dL — ABNORMAL HIGH (ref 70–99)
Phosphorus: 7.7 mg/dL — ABNORMAL HIGH (ref 2.5–4.6)
Potassium: 3.3 mmol/L — ABNORMAL LOW (ref 3.5–5.1)
Sodium: 141 mmol/L (ref 135–145)

## 2020-06-10 LAB — CBC
HCT: 31.7 % — ABNORMAL LOW (ref 39.0–52.0)
Hemoglobin: 10.5 g/dL — ABNORMAL LOW (ref 13.0–17.0)
MCH: 29.7 pg (ref 26.0–34.0)
MCHC: 33.1 g/dL (ref 30.0–36.0)
MCV: 89.5 fL (ref 80.0–100.0)
Platelets: 134 10*3/uL — ABNORMAL LOW (ref 150–400)
RBC: 3.54 MIL/uL — ABNORMAL LOW (ref 4.22–5.81)
RDW: 16.4 % — ABNORMAL HIGH (ref 11.5–15.5)
WBC: 6.8 10*3/uL (ref 4.0–10.5)
nRBC: 0 % (ref 0.0–0.2)

## 2020-06-10 LAB — GLUCOSE, CAPILLARY
Glucose-Capillary: 148 mg/dL — ABNORMAL HIGH (ref 70–99)
Glucose-Capillary: 149 mg/dL — ABNORMAL HIGH (ref 70–99)
Glucose-Capillary: 165 mg/dL — ABNORMAL HIGH (ref 70–99)
Glucose-Capillary: 190 mg/dL — ABNORMAL HIGH (ref 70–99)
Glucose-Capillary: 211 mg/dL — ABNORMAL HIGH (ref 70–99)
Glucose-Capillary: 248 mg/dL — ABNORMAL HIGH (ref 70–99)
Glucose-Capillary: 249 mg/dL — ABNORMAL HIGH (ref 70–99)

## 2020-06-10 LAB — C3 COMPLEMENT: C3 Complement: 143 mg/dL (ref 82–167)

## 2020-06-10 LAB — MAGNESIUM: Magnesium: 2.6 mg/dL — ABNORMAL HIGH (ref 1.7–2.4)

## 2020-06-10 LAB — C4 COMPLEMENT: Complement C4, Body Fluid: 44 mg/dL — ABNORMAL HIGH (ref 12–38)

## 2020-06-10 LAB — TRIGLYCERIDES: Triglycerides: 840 mg/dL — ABNORMAL HIGH (ref <150)

## 2020-06-10 LAB — CRYPTOCOCCAL ANTIGEN: Crypto Ag: NEGATIVE

## 2020-06-10 LAB — ANA W/REFLEX IF POSITIVE: Anti Nuclear Antibody (ANA): NEGATIVE

## 2020-06-10 IMAGING — DX DG CHEST 1V PORT
1 series · 1 of 1 positions shown · non-contrast
Comparison: [DATE]

CLINICAL DATA: Central line placement.

EXAM:
PORTABLE CHEST 1 VIEW

[chest ap]
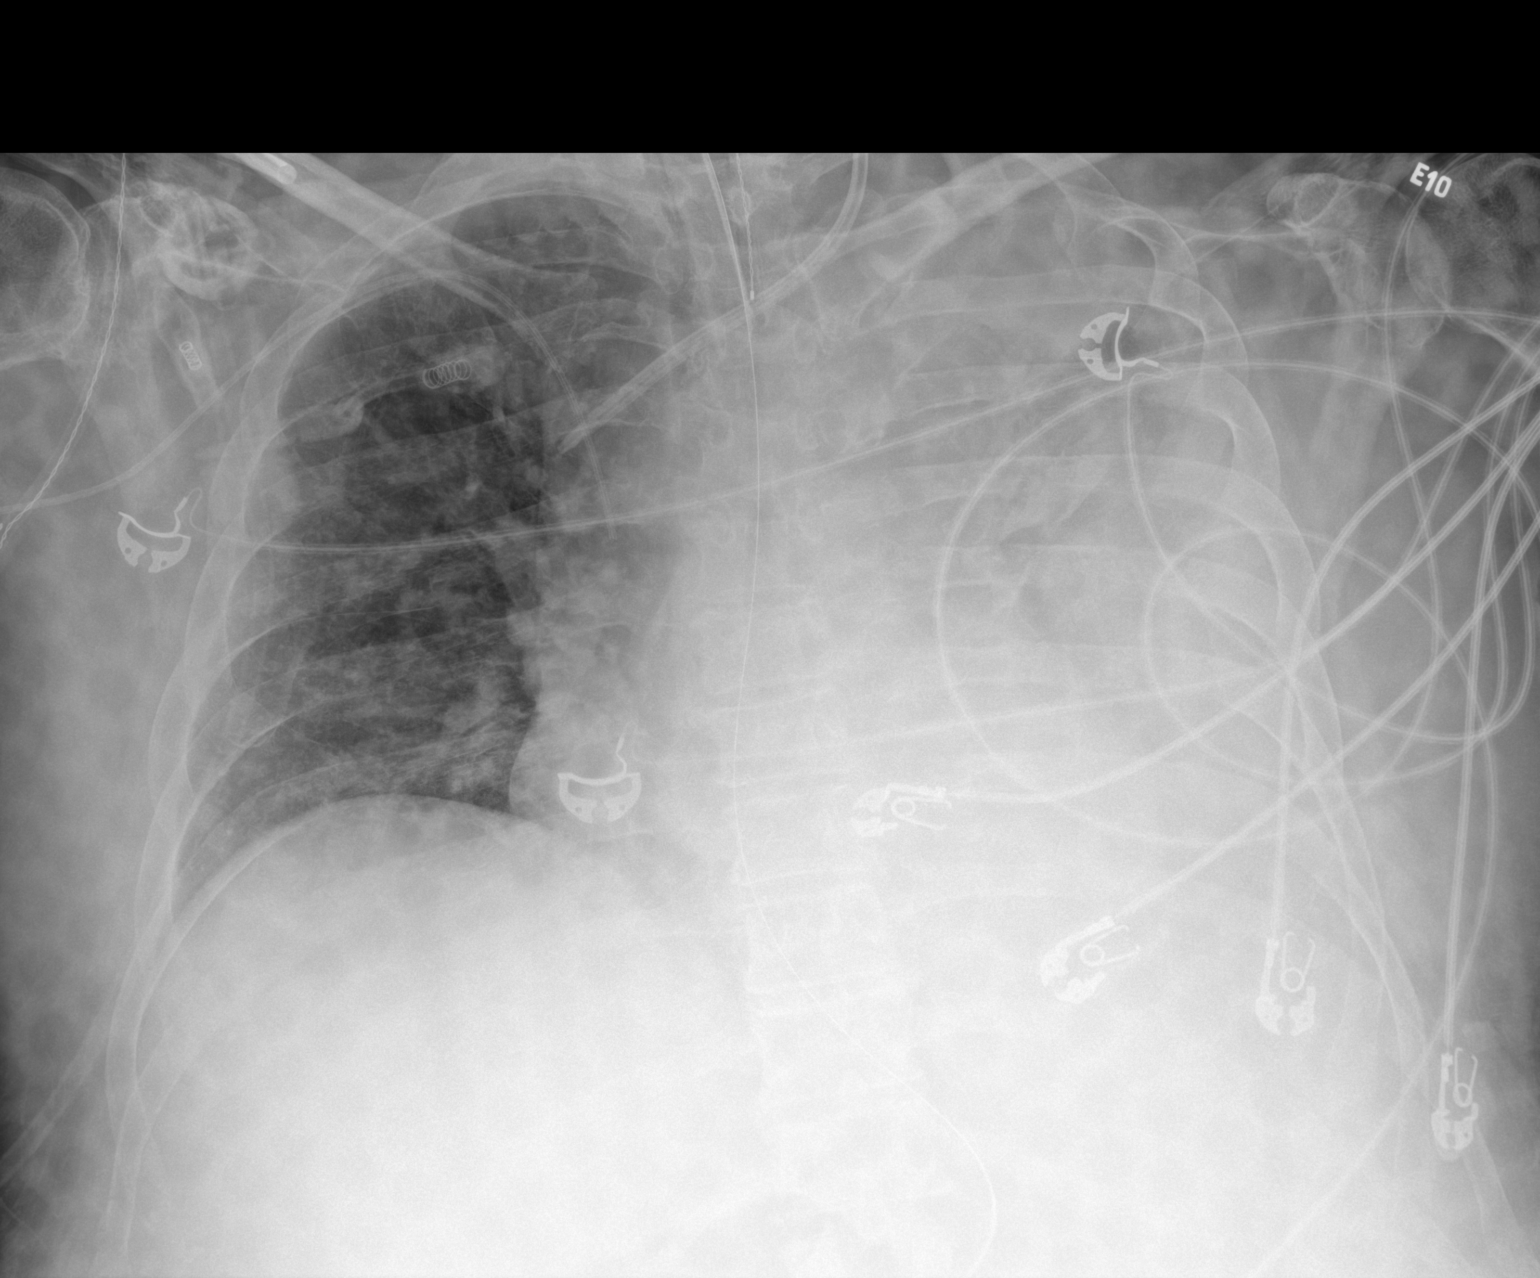

[1 of 1 positions shown; findings below may reference images not displayed]

FINDINGS: The endotracheal tube is 4 cm above the carina. The NG tube is
coursing down the esophagus and into the stomach.

Right PICC line tip is in the mid SVC.

Left IJ central venous catheter tip is in the SVC just below the
brachiocephalic SVC junction.

Persistent and progressive opacification of the left hemithorax.
IMPRESSION: 1. Support apparatus in good position without complicating features.
2. Persistent and progressive opacification of the left hemithorax.

## 2020-06-10 MED ORDER — HEPARIN SODIUM (PORCINE) 1000 UNIT/ML DIALYSIS
1000.0000 [IU] | INTRAMUSCULAR | Status: DC | PRN
  Filled 2020-06-10: qty 6

## 2020-06-10 MED ORDER — SODIUM CHLORIDE 0.9 % IV SOLN
500.0000 mg | INTRAVENOUS | Status: DC
  Administered 2020-06-11: 500 mg via INTRAVENOUS
  Filled 2020-06-10: qty 500

## 2020-06-10 MED ORDER — VECURONIUM BROMIDE 10 MG IV SOLR: 10.0000 mg | Freq: Once | INTRAVENOUS | Status: AC

## 2020-06-10 MED ORDER — RENA-VITE PO TABS
1.0000 | ORAL_TABLET | Freq: Every day | ORAL | Status: DC
  Administered 2020-06-10 – 2020-06-18 (×9): 1
  Filled 2020-06-10 (×9): qty 1

## 2020-06-10 MED ORDER — METHYLPREDNISOLONE SODIUM SUCC 40 MG IJ SOLR
20.0000 mg | Freq: Two times a day (BID) | INTRAMUSCULAR | Status: DC
  Administered 2020-06-10 – 2020-06-11 (×3): 20 mg via INTRAVENOUS
  Filled 2020-06-10 (×3): qty 1

## 2020-06-10 MED ORDER — VECURONIUM BROMIDE 10 MG IV SOLR
INTRAVENOUS | Status: AC
  Administered 2020-06-10: 10 mg via INTRAVENOUS
  Filled 2020-06-10: qty 10

## 2020-06-10 MED ORDER — HEPARIN SODIUM (PORCINE) 5000 UNIT/ML IJ SOLN
5000.0000 [IU] | Freq: Three times a day (TID) | INTRAMUSCULAR | Status: DC
  Administered 2020-06-10 – 2020-08-02 (×154): 5000 [IU] via SUBCUTANEOUS
  Filled 2020-06-10 (×152): qty 1

## 2020-06-10 MED ORDER — SODIUM CHLORIDE 0.9 % IV SOLN
100.0000 mg | Freq: Two times a day (BID) | INTRAVENOUS | Status: DC
  Administered 2020-06-10 – 2020-06-17 (×15): 100 mg via INTRAVENOUS
  Filled 2020-06-10 (×17): qty 100

## 2020-06-10 MED ORDER — PROSOURCE TF PO LIQD
45.0000 mL | Freq: Every day | ORAL | Status: DC
  Administered 2020-06-11 – 2020-06-19 (×9): 45 mL
  Filled 2020-06-10 (×9): qty 45

## 2020-06-10 MED ORDER — INSULIN ASPART 100 UNIT/ML IJ SOLN
0.0000 [IU] | INTRAMUSCULAR | Status: DC
  Administered 2020-06-11: 4 [IU] via SUBCUTANEOUS
  Administered 2020-06-11 (×3): 7 [IU] via SUBCUTANEOUS
  Administered 2020-06-11: 11 [IU] via SUBCUTANEOUS
  Administered 2020-06-11 – 2020-06-12 (×3): 7 [IU] via SUBCUTANEOUS
  Administered 2020-06-12: 11 [IU] via SUBCUTANEOUS
  Administered 2020-06-12: 7 [IU] via SUBCUTANEOUS
  Administered 2020-06-12: 4 [IU] via SUBCUTANEOUS
  Administered 2020-06-12: 7 [IU] via SUBCUTANEOUS
  Administered 2020-06-12 – 2020-06-13 (×2): 4 [IU] via SUBCUTANEOUS
  Administered 2020-06-13 (×2): 7 [IU] via SUBCUTANEOUS
  Administered 2020-06-13: 4 [IU] via SUBCUTANEOUS
  Administered 2020-06-13: 7 [IU] via SUBCUTANEOUS
  Administered 2020-06-13 – 2020-06-14 (×2): 4 [IU] via SUBCUTANEOUS
  Administered 2020-06-14: 7 [IU] via SUBCUTANEOUS
  Administered 2020-06-14: 11 [IU] via SUBCUTANEOUS
  Administered 2020-06-14 (×2): 7 [IU] via SUBCUTANEOUS
  Administered 2020-06-14 – 2020-06-15 (×2): 11 [IU] via SUBCUTANEOUS
  Administered 2020-06-15: 15 [IU] via SUBCUTANEOUS
  Administered 2020-06-15 (×2): 4 [IU] via SUBCUTANEOUS
  Administered 2020-06-15: 11 [IU] via SUBCUTANEOUS
  Administered 2020-06-16: 7 [IU] via SUBCUTANEOUS
  Administered 2020-06-16: 4 [IU] via SUBCUTANEOUS
  Administered 2020-06-16: 7 [IU] via SUBCUTANEOUS
  Administered 2020-06-16: 11 [IU] via SUBCUTANEOUS
  Administered 2020-06-16: 15 [IU] via SUBCUTANEOUS
  Administered 2020-06-16: 4 [IU] via SUBCUTANEOUS
  Administered 2020-06-16: 7 [IU] via SUBCUTANEOUS
  Administered 2020-06-17 (×3): 4 [IU] via SUBCUTANEOUS
  Administered 2020-06-17: 7 [IU] via SUBCUTANEOUS
  Administered 2020-06-17: 4 [IU] via SUBCUTANEOUS
  Administered 2020-06-18 (×2): 7 [IU] via SUBCUTANEOUS
  Administered 2020-06-18: 4 [IU] via SUBCUTANEOUS
  Administered 2020-06-18: 3 [IU] via SUBCUTANEOUS
  Administered 2020-06-18 (×2): 4 [IU] via SUBCUTANEOUS
  Administered 2020-06-19: 3 [IU] via SUBCUTANEOUS
  Administered 2020-06-19 (×2): 4 [IU] via SUBCUTANEOUS
  Administered 2020-06-19 – 2020-06-20 (×2): 3 [IU] via SUBCUTANEOUS
  Administered 2020-06-20: 4 [IU] via SUBCUTANEOUS
  Administered 2020-06-21: 3 [IU] via SUBCUTANEOUS
  Administered 2020-06-21: 4 [IU] via SUBCUTANEOUS
  Administered 2020-06-21: 3 [IU] via SUBCUTANEOUS
  Administered 2020-06-22 (×4): 4 [IU] via SUBCUTANEOUS
  Administered 2020-06-22: 7 [IU] via SUBCUTANEOUS
  Administered 2020-06-22: 4 [IU] via SUBCUTANEOUS
  Administered 2020-06-23: 3 [IU] via SUBCUTANEOUS
  Administered 2020-06-23 (×3): 4 [IU] via SUBCUTANEOUS
  Administered 2020-06-23: 7 [IU] via SUBCUTANEOUS
  Administered 2020-06-23 – 2020-06-24 (×4): 4 [IU] via SUBCUTANEOUS
  Administered 2020-06-24: 3 [IU] via SUBCUTANEOUS
  Administered 2020-06-24: 4 [IU] via SUBCUTANEOUS
  Administered 2020-06-25 (×2): 3 [IU] via SUBCUTANEOUS
  Administered 2020-06-25: 4 [IU] via SUBCUTANEOUS
  Administered 2020-06-25 – 2020-06-26 (×2): 3 [IU] via SUBCUTANEOUS
  Administered 2020-06-26 (×3): 4 [IU] via SUBCUTANEOUS
  Administered 2020-06-26: 3 [IU] via SUBCUTANEOUS
  Administered 2020-06-27 – 2020-06-28 (×9): 4 [IU] via SUBCUTANEOUS
  Administered 2020-06-28: 7 [IU] via SUBCUTANEOUS
  Administered 2020-06-28: 4 [IU] via SUBCUTANEOUS
  Administered 2020-06-29: 3 [IU] via SUBCUTANEOUS
  Administered 2020-06-29: 4 [IU] via SUBCUTANEOUS
  Administered 2020-06-29: 7 [IU] via SUBCUTANEOUS
  Administered 2020-06-30 (×4): 3 [IU] via SUBCUTANEOUS
  Administered 2020-07-01 (×2): 4 [IU] via SUBCUTANEOUS
  Administered 2020-07-02: 13:00:00 3 [IU] via SUBCUTANEOUS
  Administered 2020-07-02 (×3): 4 [IU] via SUBCUTANEOUS
  Administered 2020-07-02 – 2020-07-04 (×7): 3 [IU] via SUBCUTANEOUS
  Administered 2020-07-05 (×2): 4 [IU] via SUBCUTANEOUS
  Administered 2020-07-05 (×2): 3 [IU] via SUBCUTANEOUS
  Administered 2020-07-06: 08:00:00 4 [IU] via SUBCUTANEOUS
  Administered 2020-07-06: 3 [IU] via SUBCUTANEOUS
  Administered 2020-07-06: 17:00:00 4 [IU] via SUBCUTANEOUS
  Administered 2020-07-06 – 2020-07-07 (×3): 3 [IU] via SUBCUTANEOUS
  Administered 2020-07-07 (×3): 4 [IU] via SUBCUTANEOUS
  Administered 2020-07-08: 3 [IU] via SUBCUTANEOUS
  Administered 2020-07-08: 4 [IU] via SUBCUTANEOUS
  Administered 2020-07-08 – 2020-07-09 (×4): 3 [IU] via SUBCUTANEOUS
  Administered 2020-07-09 (×2): 4 [IU] via SUBCUTANEOUS
  Administered 2020-07-09 – 2020-07-10 (×2): 3 [IU] via SUBCUTANEOUS
  Administered 2020-07-10 (×3): 4 [IU] via SUBCUTANEOUS
  Administered 2020-07-10 – 2020-07-11 (×2): 3 [IU] via SUBCUTANEOUS
  Administered 2020-07-11: 4 [IU] via SUBCUTANEOUS
  Administered 2020-07-11 (×3): 3 [IU] via SUBCUTANEOUS
  Administered 2020-07-11 – 2020-07-12 (×2): 4 [IU] via SUBCUTANEOUS
  Administered 2020-07-12: 14:00:00 3 [IU] via SUBCUTANEOUS
  Administered 2020-07-12: 4 [IU] via SUBCUTANEOUS
  Administered 2020-07-12 – 2020-07-13 (×3): 3 [IU] via SUBCUTANEOUS
  Administered 2020-07-13 (×2): 4 [IU] via SUBCUTANEOUS
  Administered 2020-07-13: 15:00:00 3 [IU] via SUBCUTANEOUS
  Administered 2020-07-13 – 2020-07-14 (×4): 4 [IU] via SUBCUTANEOUS
  Administered 2020-07-14: 3 [IU] via SUBCUTANEOUS
  Administered 2020-07-14 (×2): 4 [IU] via SUBCUTANEOUS
  Administered 2020-07-15 (×2): 3 [IU] via SUBCUTANEOUS
  Administered 2020-07-15 (×3): 4 [IU] via SUBCUTANEOUS
  Administered 2020-07-16: 7 [IU] via SUBCUTANEOUS
  Administered 2020-07-16: 3 [IU] via SUBCUTANEOUS
  Administered 2020-07-16 – 2020-07-17 (×4): 4 [IU] via SUBCUTANEOUS
  Administered 2020-07-17 (×2): 3 [IU] via SUBCUTANEOUS
  Administered 2020-07-17 – 2020-07-18 (×4): 4 [IU] via SUBCUTANEOUS
  Administered 2020-07-18 (×3): 3 [IU] via SUBCUTANEOUS
  Administered 2020-07-18: 4 [IU] via SUBCUTANEOUS
  Administered 2020-07-18: 7 [IU] via SUBCUTANEOUS
  Administered 2020-07-19: 08:00:00 3 [IU] via SUBCUTANEOUS
  Administered 2020-07-19: 05:00:00 4 [IU] via SUBCUTANEOUS
  Administered 2020-07-19: 13:00:00 3 [IU] via SUBCUTANEOUS
  Administered 2020-07-19 (×2): 4 [IU] via SUBCUTANEOUS
  Administered 2020-07-19: 21:00:00 3 [IU] via SUBCUTANEOUS
  Administered 2020-07-20: 09:00:00 4 [IU] via SUBCUTANEOUS
  Administered 2020-07-20 (×2): 3 [IU] via SUBCUTANEOUS
  Administered 2020-07-20 (×2): 4 [IU] via SUBCUTANEOUS
  Administered 2020-07-21: 04:00:00 3 [IU] via SUBCUTANEOUS
  Administered 2020-07-21: 4 [IU] via SUBCUTANEOUS
  Administered 2020-07-21: 3 [IU] via SUBCUTANEOUS
  Administered 2020-07-21 (×2): 4 [IU] via SUBCUTANEOUS
  Administered 2020-07-21 – 2020-07-22 (×2): 3 [IU] via SUBCUTANEOUS
  Administered 2020-07-22: 11 [IU] via SUBCUTANEOUS
  Administered 2020-07-22 – 2020-07-23 (×5): 4 [IU] via SUBCUTANEOUS
  Administered 2020-07-24 (×2): 3 [IU] via SUBCUTANEOUS
  Administered 2020-07-24: 4 [IU] via SUBCUTANEOUS
  Filled 2020-06-10 (×205): qty 1

## 2020-06-10 MED ORDER — SODIUM CHLORIDE 0.9 % IV SOLN
INTRAVENOUS | Status: DC | PRN
  Administered 2020-06-10 – 2020-06-21 (×6): 250 mL via INTRAVENOUS

## 2020-06-10 MED ORDER — BUDESONIDE 0.5 MG/2ML IN SUSP
0.5000 mg | Freq: Two times a day (BID) | RESPIRATORY_TRACT | Status: DC
  Administered 2020-06-10 – 2020-06-20 (×20): 0.5 mg via RESPIRATORY_TRACT
  Filled 2020-06-10 (×20): qty 2

## 2020-06-10 MED ORDER — ALBUMIN HUMAN 25 % IV SOLN
25.0000 g | Freq: Four times a day (QID) | INTRAVENOUS | Status: DC
  Administered 2020-06-10 – 2020-06-11 (×3): 25 g via INTRAVENOUS
  Filled 2020-06-10 (×4): qty 100

## 2020-06-10 MED ORDER — MIDAZOLAM 50MG/50ML (1MG/ML) PREMIX INFUSION
0.5000 mg/h | INTRAVENOUS | Status: DC
  Administered 2020-06-10: 2 mg/h via INTRAVENOUS
  Administered 2020-06-10: 4 mg/h via INTRAVENOUS
  Administered 2020-06-11: 7.5 mg/h via INTRAVENOUS
  Administered 2020-06-11: 5 mg/h via INTRAVENOUS
  Administered 2020-06-11: 7 mg/h via INTRAVENOUS
  Administered 2020-06-12: 8 mg/h via INTRAVENOUS
  Administered 2020-06-12 (×2): 7.5 mg/h via INTRAVENOUS
  Filled 2020-06-10 (×9): qty 50

## 2020-06-10 MED ORDER — VITAL HIGH PROTEIN PO LIQD
1000.0000 mL | ORAL | Status: DC
  Administered 2020-06-10 – 2020-06-18 (×7): 1000 mL

## 2020-06-10 MED ORDER — IPRATROPIUM-ALBUTEROL 0.5-2.5 (3) MG/3ML IN SOLN
3.0000 mL | RESPIRATORY_TRACT | Status: DC
  Administered 2020-06-10 – 2020-06-17 (×40): 3 mL via RESPIRATORY_TRACT
  Filled 2020-06-10 (×39): qty 3

## 2020-06-10 NOTE — Progress Notes (Signed)
Pre HD assessment  

## 2020-06-10 NOTE — Progress Notes (Signed)
HD tx end at 1729. Pt sedated, on vent, no changes, stable, bp 152/70, map 93. 0L removed. Report to primary RN.

## 2020-06-10 NOTE — Progress Notes (Signed)
Date of Admission:  06/04/2020     ID: Delrae Holston Benton Brooke Bonito. is a 60 y.o. male Principal Problem:   CAP (community acquired pneumonia) Active Problems:   Acute metabolic encephalopathy   Sepsis ([place])   Neurogenic bladder   Chronic, continuous use of opioids   Chronic low back pain   Type 2 diabetes mellitus without complication (HCC)   Migraines   Recurrent UTI   History of colon polyps   Urinary retention   Altered mental status    Subjective: Remains intubated   Medications:  . chlorhexidine gluconate (MEDLINE KIT)  15 mL Mouth Rinse BID  . Chlorhexidine Gluconate Cloth  6 each Topical Daily  . feeding supplement (PROSource TF)  90 mL Per Tube QID  . feeding supplement (VITAL HIGH PROTEIN)  1,000 mL Per Tube Q24H  . free water  100 mL Per Tube Q4H  . heparin injection (subcutaneous)  5,000 Units Subcutaneous Q8H  . insulin aspart  0-15 Units Subcutaneous Q4H  . mouth rinse  15 mL Mouth Rinse 10 times per day  . multivitamin with minerals  1 tablet Per Tube Daily  . pantoprazole (PROTONIX) IV  40 mg Intravenous Q24H  . prazosin  4 mg Per Tube QHS  . sodium bicarbonate  1,300 mg Per Tube BID  . sodium chloride flush  10-40 mL Intracatheter Q12H  . traZODone  150 mg Per Tube QHS    Objective: Patient Vitals for the past 24 hrs:  BP Temp Temp src Pulse Resp SpO2 Weight  06/10/20 0925 -- 99.68 F (37.6 C) -- (!) 107 15 97 % --  06/10/20 0915 139/63 -- -- -- -- -- --  06/10/20 0900 (!) 137/55 99.68 F (37.6 C) -- (!) 106 15 96 % --  06/10/20 0830 (!) 139/55 99.32 F (37.4 C) -- (!) 109 14 95 % --  06/10/20 0823 -- -- -- -- -- 96 % --  06/10/20 0800 (!) 148/59 99.32 F (37.4 C) Esophageal (!) 109 15 96 % --  06/10/20 0730 (!) 141/63 99.5 F (37.5 C) -- (!) 116 18 98 % --  06/10/20 0600 138/79 99.5 F (37.5 C) -- (!) 113 16 94 % --  06/10/20 0500 (!) 138/59 99.86 F (37.7 C) -- (!) 108 14 96 % --  06/10/20 0448 -- -- -- -- -- -- 95.5 kg  06/10/20 0400 (!)  131/54 99.68 F (37.6 C) Esophageal (!) 109 16 96 % --  06/10/20 0300 (!) 131/56 99.86 F (37.7 C) -- (!) 111 15 96 % --  06/10/20 0200 (!) 140/56 99.86 F (37.7 C) -- (!) 112 15 97 % --  06/10/20 0100 (!) 133/57 100.04 F (37.8 C) -- (!) 113 16 97 % --  06/10/20 0000 (!) 151/60 (!) 100.4 F (38 C) -- (!) 118 17 96 % --  06/09/20 2300 (!) 129/56 100.22 F (37.9 C) -- (!) 117 16 95 % --  06/09/20 2200 (!) 142/61 100.04 F (37.8 C) -- (!) 111 15 97 % --  06/09/20 2100 129/60 100.04 F (37.8 C) -- (!) 111 13 96 % --  06/09/20 2000 (!) 156/62 99.68 F (37.6 C) Esophageal (!) 114 15 96 % --  06/09/20 1900 136/61 99.5 F (37.5 C) -- (!) 109 18 97 % --  06/09/20 1800 130/62 99.86 F (37.7 C) -- (!) 108 15 97 % --  06/09/20 1700 (!) 141/61 99.68 F (37.6 C) -- (!) 110 16 97 % --  06/09/20 1600 129/62  99.5 F (37.5 C) Esophageal (!) 108 18 96 % --  06/09/20 1500 (!) 154/72 99.5 F (37.5 C) -- (!) 116 14 96 % --  06/09/20 1400 (!) 122/54 99.5 F (37.5 C) -- (!) 109 16 96 % --  06/09/20 1300 (!) 115/56 99.32 F (37.4 C) -- (!) 111 16 96 % --  06/09/20 1200 (!) 124/58 99.5 F (37.5 C) Esophageal (!) 112 18 96 % --  06/09/20 1100 130/62 99.32 F (37.4 C) -- (!) 113 17 97 % --  06/09/20 1000 133/60 99.14 F (37.3 C) -- (!) 112 14 97 % --   PHYSICAL EXAM:  General:intubated and sedated Lungs: b/l air entry Heart: tachycardia Abdomen: Soft, non-tender,not distended. Bowel sounds normal. No masses Extremities: atraumatic, no cyanosis. No edema. No clubbing Skin: blotchiness over knees, chest wall and arms hemangiomal left forearm Lymph: Cervical, supraclavicular normal. Neurologic:cannot assess  Lab Results Recent Labs    06/09/20 0511 06/10/20 0450  WBC 5.8 6.8  HGB 11.4* 10.5*  HCT 34.1* 31.7*  NA 142 141  K 3.2* 3.3*  CL 112* 111  CO2 19* 18*  BUN 61* 81*  CREATININE 3.82* 5.47*   Liver Panel Recent Labs    06/09/20 0511 06/10/20 0450  PROT 4.8*  --   ALBUMIN  1.7* 1.6*  AST 80*  --   ALT 68*  --   ALKPHOS 93  --   BILITOT 0.9  --   BILIDIR 0.1  --   IBILI 0.8  --      Microbiology: 06/06/20- BAL culture neg Afb neg Urine legionella negative Strep pneumo antigen neg   Studies/Results: ECHOCARDIOGRAM COMPLETE  Result Date: 06/09/2020    ECHOCARDIOGRAM REPORT   Patient Name:   Delrae Jayceon Rader Jr. Date of Exam: 06/08/2020 Medical Rec #:  [id number]               Height:       66.0 in Accession #:    [id number]              Weight:       209.2 lb Date of Birth:  04/06/1960                BSA:          2.038 m Patient Age:    39 years                BP:           175/82 mmHg Patient Gender: M                       HR:           122 bpm. Exam Location:  [facility] Procedure: 2D Echo and Intracardiac Opacification Agent Indications:     Acute Respiratory Distress  History:         Patient has no prior history of Echocardiogram examinations.                  Risk Factors:Hypertension and Diabetes.  Sonographer:     L Thornton-Maynard Referring Phys:  [id number] Flora Lipps Diagnosing Phys: Nelva Bush MD  Sonographer Comments: Suboptimal apical window and echo performed with patient supine and on artificial respirator. IMPRESSIONS  1. Left ventricular ejection fraction, by estimation, is 65 to 70%. The left ventricle has normal function. The left ventricle has no regional wall motion abnormalities. There is moderate left ventricular hypertrophy. Left ventricular diastolic parameters are consistent  with Grade I diastolic dysfunction (impaired relaxation). Elevated left atrial pressure.  2. Right ventricular systolic function is normal. The right ventricular size is mildly enlarged.  3. The mitral valve is grossly normal. Trivial mitral valve regurgitation. No evidence of mitral stenosis.  4. The aortic valve was not well visualized. Aortic valve regurgitation is not visualized. No aortic stenosis is present. FINDINGS  Left Ventricle: Left ventricular ejection fraction,  by estimation, is 65 to 70%. The left ventricle has normal function. The left ventricle has no regional wall motion abnormalities. Definity contrast agent was given IV to delineate the left ventricular  endocardial borders. The left ventricular internal cavity size was normal in size. There is moderate left ventricular hypertrophy. Left ventricular diastolic parameters are consistent with Grade I diastolic dysfunction (impaired relaxation). Elevated left atrial pressure. Right Ventricle: Pulmonary artery pressure is normal (PASP 25 mmHg plus central venous pressure). The right ventricular size is mildly enlarged. No increase in right ventricular wall thickness. Right ventricular systolic function is normal. Left Atrium: Left atrial size was normal in size. Right Atrium: Right atrial size was normal in size. Pericardium: There is no evidence of pericardial effusion. Mitral Valve: The mitral valve is grossly normal. Trivial mitral valve regurgitation. No evidence of mitral valve stenosis. Tricuspid Valve: The tricuspid valve is grossly normal. Tricuspid valve regurgitation is mild. Aortic Valve: The aortic valve was not well visualized. Aortic valve regurgitation is not visualized. No aortic stenosis is present. Aortic valve mean gradient measures 4.0 mmHg. Aortic valve peak gradient measures 6.7 mmHg. Aortic valve area, by VTI measures 2.76 cm. Pulmonic Valve: The pulmonic valve was not well visualized. Pulmonic valve regurgitation is not visualized. No evidence of pulmonic stenosis. Aorta: The aortic root is normal in size and structure. Pulmonary Artery: The pulmonary artery is not well seen. Venous: IVC assessment for right atrial pressure unable to be performed due to mechanical ventilation. IAS/Shunts: No atrial level shunt detected by color flow Doppler.  LEFT VENTRICLE PLAX 2D LVIDd:         3.25 cm  Diastology LV PW:         1.35 cm  LV e' medial:    3.57 cm/s LV IVS:        1.52 cm  LV E/e' medial:  16.4  LVOT diam:     2.00 cm  LV e' lateral:   6.49 cm/s LV SV:         48       LV E/e' lateral: 9.0 LV SV Index:   24 LVOT Area:     3.14 cm  RIGHT VENTRICLE RV Basal diam:  4.26 cm RV S prime:     15.20 cm/s TAPSE (M-mode): 2.1 cm LEFT ATRIUM             Index       RIGHT ATRIUM          Index LA diam:        2.80 cm 1.37 cm/m  RA Area:     6.87 cm 3.37 cm/m LA Vol (A2C):   22.4 ml 10.99 ml/m LA Vol (A4C):   17.1 ml 8.39 ml/m LA Biplane Vol: 20.9 ml 10.25 ml/m  AORTIC VALVE                   PULMONIC VALVE AV Area (Vmax):    2.53 cm    PV Vmax:       1.07 m/s AV Area (Vmean):   2.67 cm  PV Peak grad:  4.6 mmHg AV Area (VTI):     2.76 cm AV Vmax:           129.00 cm/s AV Vmean:          91.500 cm/s AV VTI:            0.175 m AV Peak Grad:      6.7 mmHg AV Mean Grad:      4.0 mmHg LVOT Vmax:         104.00 cm/s LVOT Vmean:        77.700 cm/s LVOT VTI:          0.154 m LVOT/AV VTI ratio: 0.88  AORTA Ao Root diam: 2.95 cm MITRAL VALVE               TRICUSPID VALVE MV Area (PHT): 4.68 cm    TR Peak grad:   24.4 mmHg MV E velocity: 58.50 cm/s  TR Vmax:        247.00 cm/s MV A velocity: 65.60 cm/s MV E/A ratio:  0.89        SHUNTS                            Systemic VTI:  0.15 m                            Systemic Diam: 2.00 cm Nelva Bush MD Electronically signed by Nelva Bush MD Signature Date/Time: 06/09/2020/3:11:36 PM    Final      Assessment/Plan:  Acute hypoxic resp failure due to left sided pneumonia BAL culture neg On meropenem and azithromycin Doxy added ( for tickborne illness)  Altered mental status- MRI no focal lesion  AKI- likely due to contrast with underlying sepsis- seen by nephrologist, hemodialysis today  Smoker  Chronic pain syndrome  H/o transverse myelitis  Discussed the management with care team

## 2020-06-10 NOTE — Progress Notes (Signed)
NAME:  Wyatt Kluth., MRN:  [id number], DOB:  06-26-1960, LOS: 6 ADMISSION DATE:  06/04/2020 60 y.o.malewith medical history significant forHTN, PTSD, diabetes,migraines,GERD,neurogenic bladder secondary to remote history of transverse myelitis,  who self catheterizes and has frequent UTIsas well aschronic back pain on chronic opiates who at baselineis independent -brought to the ER with a 3-day history of altered mental status.  +cough,shortness of breathand feveras well asvomiting and diarrhea and started becoming increasingly confused in the 24 hours prior to arrival.   Unable to get any meaningful history from patient.  ED course: On arrival, temperature 99.3, BP 167/67, pulse 99, respirations 18 with O2 sat 96% on room air. Blood work significant for leukocytosis of 13,600 with lactic acid of 1.2. Ammonia level normal at 15, EtOH less than 10. Sodium 130, potassium 3.2, glucose 249 creatinine 1.06. Urinalysis with 80 ketones and rare bacteria. UDS positive for opiates and tricyclic's. COVID and flu negative    Imaging:   CT chest abdomen and pelvis without contrast consistent with pneumonia. Multiple nodules left upper lobe likely infectious or inflammatory with follow-up imaging recommended to document resolution. Cardiomegaly with trace pericardial effusion  Patient was started on sepsis fluids and broad-spectrum antibiotics initially for sepsis of unknown source.   Significant Hospital Events: Including procedures, antibiotic start and stop dates in addition to other pertinent events   5/31 admitted for sepsis present on admission for Left lung pneumonia  6/1 progressive confusion and hypoxia  6/2 transferred to [facility]  6/3 high fevers, remains ill, SELF EXTUBATED  6/3 emergently re-intubated  6/5 failure to wean from v ent  6/6 failure to wean from vent   MICRO DATA COVID INF A/B NEG MRSA  NEG BAL CULTURES PENDING   Antibiotics Given (last 72 hours)    Date/Time Action Medication Dose Rate   06/07/20 1802 New Bag/Given   meropenem (MERREM) 1 g in sodium chloride 0.9 % 100 mL IVPB 1 g 200 mL/hr   06/07/20 2154 New Bag/Given   azithromycin (ZITHROMAX) 500 mg in sodium chloride 0.9 % 250 mL IVPB 500 mg 250 mL/hr   06/07/20 2225 New Bag/Given   meropenem (MERREM) 1 g in sodium chloride 0.9 % 100 mL IVPB 1 g 200 mL/hr   06/08/20 0540 New Bag/Given   meropenem (MERREM) 1 g in sodium chloride 0.9 % 100 mL IVPB 1 g 200 mL/hr   06/08/20 2157 New Bag/Given   meropenem (MERREM) 1 g in sodium chloride 0.9 % 100 mL IVPB 1 g 200 mL/hr   06/08/20 2238 New Bag/Given   azithromycin (ZITHROMAX) 500 mg in sodium chloride 0.9 % 250 mL IVPB 500 mg 250 mL/hr   06/09/20 1320 New Bag/Given   meropenem (MERREM) 500 mg in sodium chloride 0.9 % 100 mL IVPB 500 mg 200 mL/hr   06/09/20 2143 New Bag/Given   azithromycin (ZITHROMAX) 500 mg in sodium chloride 0.9 % 250 mL IVPB 500 mg 250 mL/hr   06/10/20 0031 New Bag/Given   meropenem (MERREM) 500 mg in sodium chloride 0.9 % 100 mL IVPB 500 mg 200 mL/hr        Interim History / Subjective:  Remains intubated Severe resp failure Progressive renal failure       Objective   Blood pressure 138/79, pulse (!) 113, temperature 99.5 F (37.5 C), resp. rate 16, height _0  (1.676 m), weight 95.5 kg, SpO2 94 %.    Vent Mode: PRVC FiO2 (%):  [35 %-40 %] 35 % Set  Rate:  [16 bmp] 16 bmp Vt Set:  [520 mL] 520 mL PEEP:  [10 cmH20] 10 cmH20 Plateau Pressure:  [18 cmH20-23 cmH20] 18 cmH20   Intake/Output Summary (Last 24 hours) at 06/10/2020 0727 Last data filed at 06/10/2020 0600 Gross per 24 hour  Intake 2514.33 ml  Output 1110 ml  Net 1404.33 ml   Filed Weights   06/08/20 0500 06/09/20 0500 06/10/20 0448  Weight: 94.9 kg 100.8 kg 95.5 kg      REVIEW OF SYSTEMS  PATIENT IS UNABLE TO PROVIDE COMPLETE REVIEW OF SYSTEMS DUE TO SEVERE  CRITICAL ILLNESS AND TOXIC METABOLIC ENCEPHALOPATHY   PHYSICAL EXAMINATION:  GENERAL:critically ill appearing, +resp distress HEAD: Normocephalic, atraumatic.  EYES: Pupils equal, round, reactive to light.  No scleral icterus.  MOUTH: Moist mucosal membrane. NECK: Supple. PULMONARY: +rhonchi, +wheezing CARDIOVASCULAR: S1 and S2. Regular rate and rhythm. No murmurs, rubs, or gallops.  GASTROINTESTINAL: Soft, nontender, -distended. Positive bowel sounds.  MUSCULOSKELETAL: No swelling, clubbing, or edema.  NEUROLOGIC: obtunded SKIN:intact,warm,dry   Labs/imaging that I havepersonally reviewed  (right click and "Reselect all SmartList Selections" daily)      ASSESSMENT AND PLAN SYNOPSIS     60 yo white make with acute and severe hypoxic respiratory failure with acute and severe toxic metabolic encephalopathy with acute COPD ExacerbationLUL/LL multifocal pneumonia Self extubated and re intubated emergently, now with progressive renal failure    Severe ACUTE Hypoxic and Hypercapnic Respiratory Failure -continue Mechanical Ventilator support -continue Bronchodilator Therapy -Wean Fio2 and PEEP as tolerated -VAP/VENT bundle implementation  SEVERE COPD EXACERBATION -continue IV steroids as prescribed -continue NEB THERAPY as prescribed -morphine as needed -wean fio2 as needed and tolerated   CARDIAC ICU monitoring   ACUTE KIDNEY INJURY/Renal Failure -continue Foley Catheter-assess need -Avoid nephrotoxic agents -Follow urine output, BMP -Ensure adequate renal perfusion, optimize oxygenation -Renal dose medications Follow Nephrology recs Vasculitis work up pending   NEUROLOGY Acute toxic metabolic encephalopathy, need for sedation Goal RASS -2 to -3   SEPTIC SHOCK -use vasopressors to keep MAP>65 as needed -follow ABG and LA -follow up cultures -emperic ABX  INFECTIOUS DISEASE -continue antibiotics as prescribed -follow up cultures -follow up ID  consultation MRSA NEG RVP NEGATIVE COVID NEG INF AB NEG SPUTUM CX NG AFB SMEAR NEG HIV NEG     ENDO - ICU hypoglycemic\Hyperglycemia protocol -check FSBS per protocol   GI GI PROPHYLAXIS as indicated  NUTRITIONAL STATUS DIET-->TF's as tolerated Constipation protocol as indicated   ELECTROLYTES -follow labs as needed -replace as needed -pharmacy consultation and following     Best practice (right click and "Reselect all SmartList Selections" daily)  Diet:NPO Pain/Anxiety/Delirium protocol (if indicated):Yes(RASS goal -2) VAP protocol (if indicated):Yes DVT prophylaxis:Subcutaneous Heparin GI prophylaxis:H2B Glucose control:SSIYes Central venous access:N/A Arterial line:N/A Foley:N/A Mobility:bed rest PT consulted:N/A Code Status:full code Disposition:ICU    Labs   CBC: Recent Labs  Lab 06/06/20 1918 06/07/20 0516 06/08/20 0534 06/09/20 0511 06/10/20 0450  WBC 12.7* 7.7 7.0 5.8 6.8  HGB 15.1 12.8* 11.8* 11.4* 10.5*  HCT 43.6 37.2* 35.6* 34.1* 31.7*  MCV 85.3 84.7 90.4 88.6 89.5  PLT 191 150 128* 122* 134*    Basic Metabolic Panel: Recent Labs  Lab 06/06/20 1918 06/07/20 0516 06/07/20 1710 06/08/20 0534 06/08/20 1729 06/09/20 0511 06/10/20 0450  NA 140 143  --  142  --  142 141  K 3.1* 2.8* 3.3* 3.3*  --  3.2* 3.3*  CL 106 111  --  112*  --  112* 111  CO2 21* 23  --  22  --  19* 18*  GLUCOSE 240* 153*  --  199*  --  174* 154*  BUN 23 29*  --  42*  --  61* 81*  CREATININE 1.05 1.38*  --  2.16*  --  3.82* 5.47*  CALCIUM 8.4* 8.3*  --  8.3*  --  8.3* 8.0*  MG 2.1  --  2.2 2.4 2.5* 2.5* 2.6*  PHOS  --   --  2.7 4.0 4.3 5.9* 7.7*   GFR: Estimated Creatinine Clearance: 15.3 mL/min (A) (by C-G formula based on SCr of 5.47 mg/dL (H)). Recent Labs  Lab 06/04/20 1718 06/04/20 2148 06/05/20 0716 06/05/20 0729 06/05/20 3382 06/06/20 1918 06/07/20 0516 06/08/20 0534 06/09/20 0511 06/10/20 0450  PROCALCITON   --  0.45  --   --  4.33  --   --   --   --   --   WBC 13.6*  --    < >  --   --    < > 7.7 7.0 5.8 6.8  LATICACIDVEN 1.2 3.1*  --  1.9 1.5  --   --   --   --   --    < > = values in this interval not displayed.    Liver Function Tests: Recent Labs  Lab 06/04/20 1718 06/06/20 1918 06/09/20 0511 06/10/20 0450  AST 14* 191* 80*  --   ALT 11 110* 68*  --   ALKPHOS 61 68 93  --   BILITOT 1.0 1.4* 0.9  --   PROT 6.9 6.7 4.8*  --   ALBUMIN 3.4* 2.9* 1.7* 1.6*   No results for input(s): LIPASE, AMYLASE in the last 168 hours. Recent Labs  Lab 06/04/20 1718  AMMONIA 15    ABG    Component Value Date/Time   PHART 7.33 (L) 06/08/2020 0500   PCO2ART 40 06/08/2020 0500   PO2ART 99 06/08/2020 0500   HCO3 21.1 06/08/2020 0500   ACIDBASEDEF 4.5 (H) 06/08/2020 0500   O2SAT 97.2 06/08/2020 0500     Coagulation Profile: Recent Labs  Lab 06/05/20 0838  INR 1.2    Cardiac Enzymes: Recent Labs  Lab 06/06/20 1619 06/09/20 0901  CKTOTAL 85 506*    HbA1C: Hgb A1c MFr Bld  Date/Time Value Ref Range Status  06/04/2020 09:48 PM 7.4 (H) 4.8 - 5.6 % Final    Comment:    (NOTE)         Prediabetes: 5.7 - 6.4         Diabetes: >6.4         Glycemic control for adults with diabetes: <7.0     CBG: Recent Labs  Lab 06/09/20 1528 06/09/20 1930 06/09/20 2325 06/10/20 0310 06/10/20 0711  GLUCAP 143* 142* 185* 165* 149*    Allergies No Known Allergies     DVT/GI PRX  assessed I Assessed the need for Labs I Assessed the need for Foley I Assessed the need for Central Venous Line Family Discussion when available I Assessed the need for Mobilization I made an Assessment of medications to be adjusted accordingly Safety Risk assessment completed  CASE DISCUSSED IN MULTIDISCIPLINARY ROUNDS WITH ICU TEAM     Critical Care Time devoted to patient care services described in this note is 65 minutes.  Critical care was necessary to treat or prevent imminent or  life-threatening deterioration. Overall, patient is critically ill, prognosis is guarded.  Patient with Multiorgan failure and at high risk for  cardiac arrest and death.    Corrin Parker, M.D.  [facility]  Medical Director [place] Director [facility] Cardio-Pulmonary Department

## 2020-06-10 NOTE — Consult Note (Signed)
PHARMACY CONSULT NOTE  Pharmacy Consult for Electrolyte Monitoring and Replacement   Recent Labs: Potassium (mmol/L)  Date Value  06/10/2020 3.3 (L)   Magnesium (mg/dL)  Date Value  53/29/9242 2.6 (H)   Calcium (mg/dL)  Date Value  68/34/1962 8.0 (L)   Albumin (g/dL)  Date Value  22/97/9892 1.6 (L)   Phosphorus (mg/dL)  Date Value  11/94/1740 7.7 (H)   Sodium (mmol/L)  Date Value  06/10/2020 141   Assessment: Patient is a 60 y/o M with medical history including diabetes, migraines, GERD, neurogenic bladder, HTN, chronic opioid use who is admitted with sepsis secondary to pneumonia. Patient is admitted to the ICU and is currently intubated, sedated, and on mechanical ventilation.   Pharmacy consulted to assist with electrolyte monitoring and replacement as indicated.  Nutrition: Trickle feeds + free water flushes 100 mL q4h  Scr trending up (1.06 > 1.38 > 2.16 > 3.82 > 5.47)  Goal of Therapy:  Electrolytes within normal limits  Plan:  --K 3.3. Will defer replacement decision to nephrology --Corrected calcium 9.9 mg/dL. Phosphorous 7.7. Ca x Phos product = 76.23 --Monitor electrolytes with AM labs  Tressie Ellis 06/10/2020 8:09 AM

## 2020-06-10 NOTE — Progress Notes (Signed)
[facility], [place] 06/10/20  Subjective:   Hospital day # 6 Remains sedated No acute events UOP low Phos high, S Creatinine is significantly higher   Renal: 06/05 0701 - 06/06 0700 In: 2514.3 [I.V.:1379.3; NG/GT:600; IV Piggyback:475] Out: 1110 [Urine:560; Stool:550] Lab Results  Component Value Date   CREATININE 5.47 (H) 06/10/2020   CREATININE 3.82 (H) 06/09/2020   CREATININE 2.16 (H) 06/08/2020     Objective:  Vital signs in last 24 hours:  Temp:  [99.14 F (37.3 C)-100.4 F (38 C)] 99.32 F (37.4 C) (06/06 0830) Pulse Rate:  [108-118] 109 (06/06 0830) Resp:  [13-18] 14 (06/06 0830) BP: (115-156)/(54-79) 139/55 (06/06 0830) SpO2:  [94 %-98 %] 95 % (06/06 0830) FiO2 (%):  [35 %] 35 % (06/06 0823) Weight:  [95.5 kg] 95.5 kg (06/06 0448)  Weight change: -5.3 kg Filed Weights   06/08/20 0500 06/09/20 0500 06/10/20 0448  Weight: 94.9 kg 100.8 kg 95.5 kg    Intake/Output:    Intake/Output Summary (Last 24 hours) at 06/10/2020 0903 Last data filed at 06/10/2020 4259 Gross per 24 hour  Intake 2182.1 ml  Output 1365 ml  Net 817.1 ml    Physical Exam:  General: No acute distress, laying in the bed, critically ill-appearing  HEENT anicteric,  ETT, OG tube  Pulm/lungs coarse breath sounds, ventilator assisted  CVS/Heart regular rhythm, tachycardic  Abdomen:  Soft, nontender  Extremities: No peripheral edema  Neurologic: Sedated  Skin: Reddish rash diffusely over arms and legs, left forearm blanchable demarcated rash  Coud catheter in place  Rectal tube in place   Basic Metabolic Panel:  Recent Labs  Lab 06/06/20 1918 06/07/20 0516 06/07/20 1710 06/08/20 0534 06/08/20 1729 06/09/20 0511 06/10/20 0450  NA 140 143  --  142  --  142 141  K 3.1* 2.8* 3.3* 3.3*  --  3.2* 3.3*  CL 106 111  --  112*  --  112* 111  CO2 21* 23  --  22  --  19* 18*  GLUCOSE 240* 153*  --  199*  --  174* 154*  BUN 23 29*  --  42*  --  61* 81*   CREATININE 1.05 1.38*  --  2.16*  --  3.82* 5.47*  CALCIUM 8.4* 8.3*  --  8.3*  --  8.3* 8.0*  MG 2.1  --  2.2 2.4 2.5* 2.5* 2.6*  PHOS  --   --  2.7 4.0 4.3 5.9* 7.7*     CBC: Recent Labs  Lab 06/06/20 1918 06/07/20 0516 06/08/20 0534 06/09/20 0511 06/10/20 0450  WBC 12.7* 7.7 7.0 5.8 6.8  HGB 15.1 12.8* 11.8* 11.4* 10.5*  HCT 43.6 37.2* 35.6* 34.1* 31.7*  MCV 85.3 84.7 90.4 88.6 89.5  PLT 191 150 128* 122* 134*      Lab Results  Component Value Date   HEPBSAG NON REACTIVE 06/09/2020   HEPBSAB NON REACTIVE 06/09/2020      Microbiology:  Recent Results (from the past 240 hour(s))  Blood culture (single)     Status: Abnormal   Collection Time: 06/04/20  5:18 PM   Specimen: BLOOD  Result Value Ref Range Status   Specimen Description   Final    BLOOD BLOOD RIGHT HAND Performed at [facility], [address], [place], [place] [address]    Special Requests   Final    BOTTLES DRAWN AEROBIC AND ANAEROBIC Blood Culture adequate volume Performed at [facility], [place],  [place], [place] [address]    Culture  Setup Time   Final    GRAM POSITIVE COCCI IN BOTH AEROBIC AND ANAEROBIC BOTTLES CRITICAL RESULT CALLED TO, READ BACK BY AND VERIFIED WITH: Elvera Maria [id number] 06/06/2020 DLB Performed at [facility], [address] [place], [place] [address]    Culture (A)  Final    STAPHYLOCOCCUS EPIDERMIDIS THE SIGNIFICANCE OF ISOLATING THIS ORGANISM FROM A SINGLE SET OF BLOOD CULTURES WHEN MULTIPLE SETS ARE DRAWN IS UNCERTAIN. PLEASE NOTIFY THE MICROBIOLOGY DEPARTMENT WITHIN ONE WEEK IF SPECIATION AND SENSITIVITIES ARE REQUIRED. MICROCOCCUS SPECIES Standardized susceptibility testing for this organism is not available. KOCURIA RHIZOPHILIA Performed at [facility], [place] [address] [place], [place] [phone]    Report Status 06/10/2020 FINAL  Final  Resp Panel by RT-PCR (Flu A&B, Covid) Nasopharyngeal Swab     Status: None    Collection Time: 06/04/20  5:18 PM   Specimen: Nasopharyngeal Swab; Nasopharyngeal(NP) swabs in vial transport medium  Result Value Ref Range Status   SARS Coronavirus 2 by RT PCR NEGATIVE NEGATIVE Final    Comment: (NOTE) SARS-CoV-2 target nucleic acids are NOT DETECTED.  The SARS-CoV-2 RNA is generally detectable in upper respiratory specimens during the acute phase of infection. The lowest concentration of SARS-CoV-2 viral copies this assay can detect is 138 copies/mL. A negative result does not preclude SARS-Cov-2 infection and should not be used as the sole basis for treatment or other patient management decisions. A negative result may occur with  improper specimen collection/handling, submission of specimen other than nasopharyngeal swab, presence of viral mutation(s) within the areas targeted by this assay, and inadequate number of viral copies(<138 copies/mL). A negative result must be combined with clinical observations, patient history, and epidemiological information. The expected result is Negative.  Fact Sheet for Patients:  EntrepreneurPulse.com.au  Fact Sheet for Healthcare Providers:  IncredibleEmployment.be  This test is no t yet approved or cleared by the [place] FDA and  has been authorized for detection and/or diagnosis of SARS-CoV-2 by FDA under an Emergency Use Authorization (EUA). This EUA will remain  in effect (meaning this test can be used) for the duration of the COVID-19 declaration under Section 564(b)(1) of the Act, 21 U.S.C.section 360bbb-3(b)(1), unless the authorization is terminated  or revoked sooner.       Influenza A by PCR NEGATIVE NEGATIVE Final   Influenza B by PCR NEGATIVE NEGATIVE Final    Comment: (NOTE) The Xpert Xpress SARS-CoV-2/FLU/RSV plus assay is intended as an aid in the diagnosis of influenza from Nasopharyngeal swab specimens and should not be used as a sole basis for treatment.  Nasal washings and aspirates are unacceptable for Xpert Xpress SARS-CoV-2/FLU/RSV testing.  Fact Sheet for Patients: EntrepreneurPulse.com.au  Fact Sheet for Healthcare Providers: IncredibleEmployment.be  This test is not yet approved or cleared by the [place] FDA and has been authorized for detection and/or diagnosis of SARS-CoV-2 by FDA under an Emergency Use Authorization (EUA). This EUA will remain in effect (meaning this test can be used) for the duration of the COVID-19 declaration under Section 564(b)(1) of the Act, 21 U.S.C. section 360bbb-3(b)(1), unless the authorization is terminated or revoked.  Performed at [facility], [address] [place], [place] [address]   Blood Culture ID Panel (Reflexed)     Status: Abnormal   Collection Time: 06/04/20  5:18 PM  Result Value Ref Range Status   Enterococcus faecalis NOT DETECTED NOT DETECTED Final   Enterococcus Faecium NOT DETECTED  NOT DETECTED Final   Listeria monocytogenes NOT DETECTED NOT DETECTED Final   Staphylococcus species DETECTED (A) NOT DETECTED Final    Comment: CRITICAL RESULT CALLED TO, READ BACK BY AND VERIFIED WITH: [facility] MITCHELL [id number] 06/06/2020 DLB    Staphylococcus aureus (BCID) NOT DETECTED NOT DETECTED Final   Staphylococcus epidermidis DETECTED (A) NOT DETECTED Final    Comment: CRITICAL RESULT CALLED TO, READ BACK BY AND VERIFIED WITH: Elvera Maria [id number] 06/06/2020 DLB    Staphylococcus lugdunensis NOT DETECTED NOT DETECTED Final   Streptococcus species NOT DETECTED NOT DETECTED Final   Streptococcus agalactiae NOT DETECTED NOT DETECTED Final   Streptococcus pneumoniae NOT DETECTED NOT DETECTED Final   Streptococcus pyogenes NOT DETECTED NOT DETECTED Final   A.calcoaceticus-baumannii NOT DETECTED NOT DETECTED Final   Bacteroides fragilis NOT DETECTED NOT DETECTED Final   Enterobacterales NOT DETECTED NOT DETECTED Final   Enterobacter cloacae  complex NOT DETECTED NOT DETECTED Final   Escherichia coli NOT DETECTED NOT DETECTED Final   Klebsiella aerogenes NOT DETECTED NOT DETECTED Final   Klebsiella oxytoca NOT DETECTED NOT DETECTED Final   Klebsiella pneumoniae NOT DETECTED NOT DETECTED Final   Proteus species NOT DETECTED NOT DETECTED Final   Salmonella species NOT DETECTED NOT DETECTED Final   Serratia marcescens NOT DETECTED NOT DETECTED Final   Haemophilus influenzae NOT DETECTED NOT DETECTED Final   Neisseria meningitidis NOT DETECTED NOT DETECTED Final   Pseudomonas aeruginosa NOT DETECTED NOT DETECTED Final   Stenotrophomonas maltophilia NOT DETECTED NOT DETECTED Final   Candida albicans NOT DETECTED NOT DETECTED Final   Candida auris NOT DETECTED NOT DETECTED Final   Candida glabrata NOT DETECTED NOT DETECTED Final   Candida krusei NOT DETECTED NOT DETECTED Final   Candida parapsilosis NOT DETECTED NOT DETECTED Final   Candida tropicalis NOT DETECTED NOT DETECTED Final   Cryptococcus neoformans/gattii NOT DETECTED NOT DETECTED Final   Methicillin resistance mecA/C NOT DETECTED NOT DETECTED Final    Comment: Performed at [facility], [address] [place], [place] [address]  Blood culture (single)     Status: None   Collection Time: 06/04/20  9:48 PM   Specimen: BLOOD  Result Value Ref Range Status   Specimen Description BLOOD BLOOD RIGHT ARM  Final   Special Requests   Final    BOTTLES DRAWN AEROBIC AND ANAEROBIC Blood Culture adequate volume   Culture   Final    NO GROWTH 5 DAYS Performed at [facility], [address] [place], [place] [address]    Report Status 06/09/2020 FINAL  Final  Urine Culture     Status: None   Collection Time: 06/06/20  2:37 PM   Specimen: Urine, Random  Result Value Ref Range Status   Specimen Description   Final    URINE, RANDOM Performed at [facility], [address], [place], [place] [address]    Special Requests   Final     NONE Performed at [facility], [address], [place], [place] [address]    Culture   Final    NO GROWTH Performed at [facility], [address] [place], [place] [address]    Report Status 06/08/2020 FINAL  Final  MRSA PCR Screening     Status: None   Collection Time: 06/06/20  2:40 PM   Specimen: Nasopharyngeal  Result Value Ref Range Status   MRSA by PCR NEGATIVE NEGATIVE Final    Comment:        The GeneXpert  MRSA Assay (FDA approved for NASAL specimens only), is one component of a comprehensive MRSA colonization surveillance program. It is not intended to diagnose MRSA infection nor to guide or monitor treatment for MRSA infections. Performed at [facility], [place], [place] [address]   Acid Fast Smear (AFB)     Status: None   Collection Time: 06/06/20  6:30 PM   Specimen: Bronchoalveolar Lavage; Sputum  Result Value Ref Range Status   AFB Specimen Processing Concentration  Final   Acid Fast Smear Negative  Final    Comment: (NOTE) Performed At: [facility] [place], [place] [id number] Rush Farmer MD QM:[id number]    Source (AFB) BRONCHIAL ALVEOLAR LAVAGE  Corrected    Comment: Performed at [facility], [address] [place], [place] [address] CORRECTED ON 06/02 AT 2239: PREVIOUSLY REPORTED AS SPUTUM   Culture, Respiratory w Gram Stain     Status: None   Collection Time: 06/06/20  6:30 PM   Specimen: Bronchoalveolar Lavage  Result Value Ref Range Status   Specimen Description   Final    BRONCHIAL ALVEOLAR LAVAGE Performed at [facility], [address], [place], [place] [address]    Special Requests   Final    NONE Performed at [facility], [address] [place], [place] [address]    Gram Stain   Final    FEW WBC PRESENT,BOTH PMN AND MONONUCLEAR NO ORGANISMS SEEN    Culture   Final    NO GROWTH 3 DAYS Performed at [facility], [place]  [address] [place], [place] [address]    Report Status 06/09/2020 FINAL  Final  Respiratory (~20 pathogens) panel by PCR     Status: None   Collection Time: 06/07/20  5:50 AM   Specimen: Nasopharyngeal Swab; Respiratory  Result Value Ref Range Status   Adenovirus NOT DETECTED NOT DETECTED Final   Coronavirus 229E NOT DETECTED NOT DETECTED Final    Comment: (NOTE) The Coronavirus on the Respiratory Panel, DOES NOT test for the novel  Coronavirus (2019 nCoV)    Coronavirus HKU1 NOT DETECTED NOT DETECTED Final   Coronavirus NL63 NOT DETECTED NOT DETECTED Final   Coronavirus OC43 NOT DETECTED NOT DETECTED Final   Metapneumovirus NOT DETECTED NOT DETECTED Final   Rhinovirus / Enterovirus NOT DETECTED NOT DETECTED Final   Influenza A NOT DETECTED NOT DETECTED Final   Influenza B NOT DETECTED NOT DETECTED Final   Parainfluenza Virus 1 NOT DETECTED NOT DETECTED Final   Parainfluenza Virus 2 NOT DETECTED NOT DETECTED Final   Parainfluenza Virus 3 NOT DETECTED NOT DETECTED Final   Parainfluenza Virus 4 NOT DETECTED NOT DETECTED Final   Respiratory Syncytial Virus NOT DETECTED NOT DETECTED Final   Bordetella pertussis NOT DETECTED NOT DETECTED Final   Bordetella Parapertussis NOT DETECTED NOT DETECTED Final   Chlamydophila pneumoniae NOT DETECTED NOT DETECTED Final   Mycoplasma pneumoniae NOT DETECTED NOT DETECTED Final    Comment: Performed at [facility], [place] [address], [place], [place] [address]    Coagulation Studies: No results for input(s): LABPROT, INR in the last 72 hours.  Urinalysis: Recent Labs    06/09/20 0901  COLORURINE YELLOW*  LABSPEC 1.015  PHURINE 5.0  GLUCOSEU NEGATIVE  HGBUR MODERATE*  BILIRUBINUR NEGATIVE  KETONESUR NEGATIVE  PROTEINUR 100*  NITRITE NEGATIVE  LEUKOCYTESUR NEGATIVE      Imaging: ECHOCARDIOGRAM COMPLETE  Result Date: 06/09/2020    ECHOCARDIOGRAM REPORT  Patient Name:   Jovannie Ulibarri. Date of Exam: 06/08/2020 Medical Rec #:   [id number]               Height:       66.0 in Accession #:    [id number]              Weight:       209.2 lb Date of Birth:  [birth year]                BSA:          2.038 m Patient Age:    60 years                BP:           175/82 mmHg Patient Gender: M                       HR:           122 bpm. Exam Location:  [facility] Procedure: 2D Echo and Intracardiac Opacification Agent Indications:     Acute Respiratory Distress  History:         Patient has no prior history of Echocardiogram examinations.                  Risk Factors:Hypertension and Diabetes.  Sonographer:     L Thornton-Maynard Referring Phys:  [id number] Flora Lipps Diagnosing Phys: Nelva Bush MD  Sonographer Comments: Suboptimal apical window and echo performed with patient supine and on artificial respirator. IMPRESSIONS  1. Left ventricular ejection fraction, by estimation, is 65 to 70%. The left ventricle has normal function. The left ventricle has no regional wall motion abnormalities. There is moderate left ventricular hypertrophy. Left ventricular diastolic parameters are consistent with Grade I diastolic dysfunction (impaired relaxation). Elevated left atrial pressure.  2. Right ventricular systolic function is normal. The right ventricular size is mildly enlarged.  3. The mitral valve is grossly normal. Trivial mitral valve regurgitation. No evidence of mitral stenosis.  4. The aortic valve was not well visualized. Aortic valve regurgitation is not visualized. No aortic stenosis is present. FINDINGS  Left Ventricle: Left ventricular ejection fraction, by estimation, is 65 to 70%. The left ventricle has normal function. The left ventricle has no regional wall motion abnormalities. Definity contrast agent was given IV to delineate the left ventricular  endocardial borders. The left ventricular internal cavity size was normal in size. There is moderate left ventricular hypertrophy. Left ventricular diastolic parameters are consistent with Grade I  diastolic dysfunction (impaired relaxation). Elevated left atrial pressure. Right Ventricle: Pulmonary artery pressure is normal (PASP 25 mmHg plus central venous pressure). The right ventricular size is mildly enlarged. No increase in right ventricular wall thickness. Right ventricular systolic function is normal. Left Atrium: Left atrial size was normal in size. Right Atrium: Right atrial size was normal in size. Pericardium: There is no evidence of pericardial effusion. Mitral Valve: The mitral valve is grossly normal. Trivial mitral valve regurgitation. No evidence of mitral valve stenosis. Tricuspid Valve: The tricuspid valve is grossly normal. Tricuspid valve regurgitation is mild. Aortic Valve: The aortic valve was not well visualized. Aortic valve regurgitation is not visualized. No aortic stenosis is present. Aortic valve mean gradient measures 4.0 mmHg. Aortic valve peak gradient measures 6.7 mmHg. Aortic valve area, by VTI measures 2.76 cm. Pulmonic Valve: The pulmonic valve was not well visualized. Pulmonic valve regurgitation is not visualized. No evidence of pulmonic stenosis. Aorta:  The aortic root is normal in size and structure. Pulmonary Artery: The pulmonary artery is not well seen. Venous: IVC assessment for right atrial pressure unable to be performed due to mechanical ventilation. IAS/Shunts: No atrial level shunt detected by color flow Doppler.  LEFT VENTRICLE PLAX 2D LVIDd:         3.25 cm  Diastology LV PW:         1.35 cm  LV e' medial:    3.57 cm/s LV IVS:        1.52 cm  LV E/e' medial:  16.4 LVOT diam:     2.00 cm  LV e' lateral:   6.49 cm/s LV SV:         48       LV E/e' lateral: 9.0 LV SV Index:   24 LVOT Area:     3.14 cm  RIGHT VENTRICLE RV Basal diam:  4.26 cm RV S prime:     15.20 cm/s TAPSE (M-mode): 2.1 cm LEFT ATRIUM             Index       RIGHT ATRIUM          Index LA diam:        2.80 cm 1.37 cm/m  RA Area:     6.87 cm 3.37 cm/m LA Vol (A2C):   22.4 ml 10.99 ml/m LA  Vol (A4C):   17.1 ml 8.39 ml/m LA Biplane Vol: 20.9 ml 10.25 ml/m  AORTIC VALVE                   PULMONIC VALVE AV Area (Vmax):    2.53 cm    PV Vmax:       1.07 m/s AV Area (Vmean):   2.67 cm    PV Peak grad:  4.6 mmHg AV Area (VTI):     2.76 cm AV Vmax:           129.00 cm/s AV Vmean:          91.500 cm/s AV VTI:            0.175 m AV Peak Grad:      6.7 mmHg AV Mean Grad:      4.0 mmHg LVOT Vmax:         104.00 cm/s LVOT Vmean:        77.700 cm/s LVOT VTI:          0.154 m LVOT/AV VTI ratio: 0.88  AORTA Ao Root diam: 2.95 cm MITRAL VALVE               TRICUSPID VALVE MV Area (PHT): 4.68 cm    TR Peak grad:   24.4 mmHg MV E velocity: 58.50 cm/s  TR Vmax:        247.00 cm/s MV A velocity: 65.60 cm/s MV E/A ratio:  0.89        SHUNTS                            Systemic VTI:  0.15 m                            Systemic Diam: 2.00 cm Nelva Bush MD Electronically signed by Nelva Bush MD Signature Date/Time: 06/09/2020/3:11:36 PM    Final      Medications:   . sodium chloride Stopped (06/10/20 0735)  . albumin human 60  mL/hr at 06/10/20 0752  . azithromycin Stopped (06/09/20 2243)  . fentaNYL infusion INTRAVENOUS 300 mcg/hr (06/10/20 0752)  . meropenem (MERREM) IV Stopped (06/10/20 0102)  . midazolam     . chlorhexidine gluconate (MEDLINE KIT)  15 mL Mouth Rinse BID  . Chlorhexidine Gluconate Cloth  6 each Topical Daily  . feeding supplement (PROSource TF)  90 mL Per Tube QID  . feeding supplement (VITAL HIGH PROTEIN)  1,000 mL Per Tube Q24H  . free water  100 mL Per Tube Q4H  . heparin injection (subcutaneous)  5,000 Units Subcutaneous Q8H  . insulin aspart  0-15 Units Subcutaneous Q4H  . mouth rinse  15 mL Mouth Rinse 10 times per day  . multivitamin with minerals  1 tablet Per Tube Daily  . pantoprazole (PROTONIX) IV  40 mg Intravenous Q24H  . prazosin  4 mg Per Tube QHS  . sodium bicarbonate  1,300 mg Per Tube BID  . sodium chloride flush  10-40 mL Intracatheter Q12H  .  traZODone  150 mg Per Tube QHS   sodium chloride, fentaNYL, labetalol, midazolam, ondansetron **OR** ondansetron (ZOFRAN) IV, sodium chloride flush  Assessment/ Plan:  60 y.o. male with  medical problems of   Hypertension, PTSD, diabetes, migraines, GERD, neurogenic bladder secondary to remote history of transverse myelitis, requires self-catheterization and has frequent UTIs, opioids for chronic pain  admitted on 06/04/2020 for Sepsis ([place]) [A41.9] Altered mental status, unspecified altered mental status type [R41.82] Community acquired pneumonia, unspecified laterality [J18.9]   #Acute kidney injury, proteinuria, hematuria Baseline creatinine of 1.26 at admission Increasing creatinine trends since June 3. AKI is likely multifactorial secondary to ongoing sepsis as well as IV contrast exposure on June 3 Urinalysis at admission showed glucosuria, greater than 300 proteinuria, 6-10 RBCs, moderate hemoglobin noted on dipstick, 11-20 WBCs Renal imaging as noted above.  Patient had CT abdomen pelvis without contrast.  No hydronephrosis.  Bladder was unremarkable. UPC 2.55 gm RBC 21-50 cc  Plan: S Creatinine, UOP are worse Phos level is higher Potassium is low- can replace  Recommend HD for metabolic optimization ICU team to place dialysis catheter  #Acute respiratory failure Currently requiring ventilator support FiO2 35%, PEEP 10     LOS: [place] 6/6/20229:03 AM  [place], [place]  Note: This note was prepared with Dragon dictation. Any transcription errors are unintentional

## 2020-06-10 NOTE — Progress Notes (Signed)
Pre HD info 

## 2020-06-10 NOTE — Progress Notes (Signed)
PHARMACY NOTE:  ANTIMICROBIAL RENAL DOSAGE ADJUSTMENT  Current antimicrobial regimen includes a mismatch between antimicrobial dosage and estimated renal function.  As per policy approved by the Pharmacy & Therapeutics and Medical Executive Committees, the antimicrobial dosage will be adjusted accordingly.  Current antimicrobial dosage:  Meropenem 500 mg IV q12h  Indication: Pneumonia   Renal Function:  Estimated Creatinine Clearance: 15.3 mL/min (A) (by C-G formula based on SCr of 5.47 mg/dL (H)). [x]      On intermittent HD    Antimicrobial dosage has been changed to:  Meropenem 500 mg IV q24h   Thank you for allowing pharmacy to be a part of this patient's care.  , [facility] 06/10/2020 2:45 PM

## 2020-06-10 NOTE — Progress Notes (Signed)
   06/10/20 1900  Clinical Encounter Type  Visited With Family  Visit Type Initial;Social support  Referral From Nurse   This chaplain received a referral to support the patient's daughter from the patient's nurse. Upon arrival, the patient was observed to be intubated and sedated. His daughter and his daughter's fiance sat at the bedside. We all engaged in conversation about a wide variety of topics that included some of their personal, familial, and religious histories.   They expressed their appreciation for he conversation and support and welcome additional support. Of note, the patient's daughter is non-Christian Civil Service fast streamer) and her fiance grew up [employer], but is no longer a Museum/gallery conservator. Support provided in the form of active and reflective listening, compassionate ministerial presence, and life-affirming humor.  Clovis Riley, Chaplain

## 2020-06-10 NOTE — Procedures (Signed)
Central Venous Catheter Insertion Procedure Note  Wyatt Ball  [id number]  27-Feb-1960  Date:06/10/20  Time:11:28 AM   Provider Performing:Yona Kosek D Elvina Sidle   Procedure: Insertion of Non-tunneled Central Venous Catheter(36556)with US guidance (76734)    Indication(s) Hemodialysis  Consent Risks of the procedure as well as the alternatives and risks of each were explained to the patient and/or caregiver.  Consent for the procedure was obtained and is signed in the bedside chart  Anesthesia Topical only with 1% lidocaine   Timeout Verified patient identification, verified procedure, site/side was marked, verified correct patient position, special equipment/implants available, medications/allergies/relevant history reviewed, required imaging and test results available.  Sterile Technique Maximal sterile technique including full sterile barrier drape, hand hygiene, sterile gown, sterile gloves, mask, hair covering, sterile ultrasound probe cover (if used).  Procedure Description Area of catheter insertion was cleaned with chlorhexidine and draped in sterile fashion.   With real-time ultrasound guidance a HD catheter was placed into the left internal jugular vein.  Nonpulsatile blood flow and easy flushing noted in all ports.  The catheter was sutured in place and sterile dressing applied.  Complications/Tolerance None; patient tolerated the procedure well. Chest X-ray is ordered to verify placement for internal jugular or subclavian cannulation.  Chest x-ray is not ordered for femoral cannulation.  EBL Minimal  Specimen(s) None  Line secured at the 20 cm mark.  BIOPATCH applied to the insertion site.    Harlon Ditty, AGACNP-BC [facility] Prefer epic messenger for cross cover needs If after hours, please call E-link

## 2020-06-10 NOTE — Progress Notes (Signed)
Patient receiving HD in ICU. Primary RN and RT present w/ HD RN. Pt sedated and on ventilator. Family present pre HD and educated on HD and procedures. Will be in family waiting area until HD tx ends. Pt has a 2hr tx, 4k+ acid bath, 0L fluid removal ordered. Earleen Reaper MD monitored patient during HD as well. No tx complications as of yet.

## 2020-06-10 NOTE — Progress Notes (Signed)
HD end 

## 2020-06-10 NOTE — Progress Notes (Signed)
Nutrition Follow-up  DOCUMENTATION CODES:   Obesity unspecified  INTERVENTION:   Change to Vital HP @60ml /hr + ProSource 80ml daily via tube   Free water flushes 48m q 6 hours per MD  Regimen provides 1480kcal/day, 137g/day protein and 1827ml/day of free water   Rena-vit daily via tube   NUTRITION DIAGNOSIS:   Inadequate oral intake related to inability to eat as evidenced by NPO status.  GOAL:   Provide needs based on ASPEN/SCCM guidelines  MONITOR:   TF tolerance,Vent status,Labs,I & O's  ASSESSMENT:   60 y.o. male with h/o hypertension, PTSD, diabetes, migraines, GERD, neurogenic bladder secondary to remote history of transverse myelitis, requires self-catheterization and has frequent UTIs and opioids for chronic pain who is admitted on 06/04/2020 for CAP and sepsis   Pt remains sedated and ventilated. OGT in place. Pt tolerating tube feeds well at goal rate; RD will adjust tube feeds as propofol now off. Pt remains weight stable since admit. Plan is for possible HD initiation for AKI.   Medications reviewed and include: heparin, insulin, solu-medrol, MVI, protonix, Na bicarbonate, albumin, azithromycin, doxycycline, meropenem  Labs reviewed: K 3.3(L), BUN 81(H), creat 5.47(H), P 7.7(H), Mg 2.6(H), alb 1.6(L) Hgb 10.5(L), Hct 31.7(L) cbgs- 165, 149 x 24 hrs  Patient is currently intubated on ventilator support MV: 8.1 L/min Temp (24hrs), Avg:99.7 F (37.6 C), Min:99.32 F (37.4 C), Max:100.4 F (38 C)  Propofol: none   MAP- >82mmHg  UOP- 77m   Diet Order:    Diet Order            Diet NPO time specified  Diet effective now                EDUCATION NEEDS:   No education needs have been identified at this time  Skin:  Skin Assessment: Reviewed RN Assessment  Last BM:  6/6- type 6  Height:   Ht Readings from Last 1 Encounters:  06/07/20 5\' 6"  (1.676 m)    Weight:   Wt Readings from Last 1 Encounters:  06/10/20 95.5 kg    Ideal Body  Weight:  64.5 kg  BMI:  Body mass index is 33.98 kg/m.  Estimated Nutritional Needs:   Kcal:  1100-1400 kcal/d  Protein:  130-160 g/d  Fluid:  1.9-2.2L/day  MS, RD, LDN Please refer to [facility] for RD and/or RD on-call/weekend/after hours pager

## 2020-06-11 DIAGNOSIS — J441 Chronic obstructive pulmonary disease with (acute) exacerbation: Secondary | ICD-10-CM

## 2020-06-11 DIAGNOSIS — G9341 Metabolic encephalopathy: Secondary | ICD-10-CM | POA: Diagnosis not present

## 2020-06-11 DIAGNOSIS — J189 Pneumonia, unspecified organism: Secondary | ICD-10-CM | POA: Diagnosis not present

## 2020-06-11 DIAGNOSIS — R4182 Altered mental status, unspecified: Secondary | ICD-10-CM | POA: Diagnosis not present

## 2020-06-11 LAB — RENAL FUNCTION PANEL
Albumin: 2.4 g/dL — ABNORMAL LOW (ref 3.5–5.0)
Anion gap: 12 (ref 5–15)
BUN: 86 mg/dL — ABNORMAL HIGH (ref 8–23)
CO2: 22 mmol/L (ref 22–32)
Calcium: 8.2 mg/dL — ABNORMAL LOW (ref 8.9–10.3)
Chloride: 108 mmol/L (ref 98–111)
Creatinine, Ser: 4.98 mg/dL — ABNORMAL HIGH (ref 0.61–1.24)
GFR, Estimated: 12 mL/min — ABNORMAL LOW (ref >60)
Glucose, Bld: 255 mg/dL — ABNORMAL HIGH (ref 70–99)
Phosphorus: 7.5 mg/dL — ABNORMAL HIGH (ref 2.5–4.6)
Potassium: 3.8 mmol/L (ref 3.5–5.1)
Sodium: 142 mmol/L (ref 135–145)

## 2020-06-11 LAB — GLUCOSE, CAPILLARY
Glucose-Capillary: 176 mg/dL — ABNORMAL HIGH (ref 70–99)
Glucose-Capillary: 217 mg/dL — ABNORMAL HIGH (ref 70–99)
Glucose-Capillary: 240 mg/dL — ABNORMAL HIGH (ref 70–99)
Glucose-Capillary: 242 mg/dL — ABNORMAL HIGH (ref 70–99)
Glucose-Capillary: 244 mg/dL — ABNORMAL HIGH (ref 70–99)
Glucose-Capillary: 251 mg/dL — ABNORMAL HIGH (ref 70–99)

## 2020-06-11 LAB — CBC WITH DIFFERENTIAL/PLATELET
Abs Immature Granulocytes: 0.22 10*3/uL — ABNORMAL HIGH (ref 0.00–0.07)
Basophils Absolute: 0 10*3/uL (ref 0.0–0.1)
Basophils Relative: 0 %
Eosinophils Absolute: 0 10*3/uL (ref 0.0–0.5)
Eosinophils Relative: 0 %
HCT: 29.4 % — ABNORMAL LOW (ref 39.0–52.0)
Hemoglobin: 10.1 g/dL — ABNORMAL LOW (ref 13.0–17.0)
Immature Granulocytes: 2 %
Lymphocytes Relative: 4 %
Lymphs Abs: 0.4 10*3/uL — ABNORMAL LOW (ref 0.7–4.0)
MCH: 29.4 pg (ref 26.0–34.0)
MCHC: 34.4 g/dL (ref 30.0–36.0)
MCV: 85.7 fL (ref 80.0–100.0)
Monocytes Absolute: 0.4 10*3/uL (ref 0.1–1.0)
Monocytes Relative: 4 %
Neutro Abs: 9.1 10*3/uL — ABNORMAL HIGH (ref 1.7–7.7)
Neutrophils Relative %: 90 %
Platelets: 167 10*3/uL (ref 150–400)
RBC: 3.43 MIL/uL — ABNORMAL LOW (ref 4.22–5.81)
RDW: 16.2 % — ABNORMAL HIGH (ref 11.5–15.5)
WBC: 10.1 10*3/uL (ref 4.0–10.5)
nRBC: 0 % (ref 0.0–0.2)

## 2020-06-11 LAB — MAGNESIUM: Magnesium: 2.6 mg/dL — ABNORMAL HIGH (ref 1.7–2.4)

## 2020-06-11 LAB — ANTISTREPTOLYSIN O TITER: ASO: 34 IU/mL (ref 0.0–200.0)

## 2020-06-11 LAB — PROTEIN ELECTROPHORESIS, SERUM
A/G Ratio: 0.5 — ABNORMAL LOW (ref 0.7–1.7)
Albumin ELP: 1.5 g/dL — ABNORMAL LOW (ref 2.9–4.4)
Alpha-1-Globulin: 0.5 g/dL — ABNORMAL HIGH (ref 0.0–0.4)
Alpha-2-Globulin: 1.2 g/dL — ABNORMAL HIGH (ref 0.4–1.0)
Beta Globulin: 0.7 g/dL (ref 0.7–1.3)
Gamma Globulin: 0.5 g/dL (ref 0.4–1.8)
Globulin, Total: 2.9 g/dL (ref 2.2–3.9)
M-Spike, %: 0.1 g/dL — ABNORMAL HIGH
Total Protein ELP: 4.4 g/dL — ABNORMAL LOW (ref 6.0–8.5)

## 2020-06-11 LAB — GLOMERULAR BASEMENT MEMBRANE ANTIBODIES: GBM Ab: 3 units (ref 0–20)

## 2020-06-11 MED ORDER — LEVOFLOXACIN IN D5W 750 MG/150ML IV SOLN
750.0000 mg | Freq: Once | INTRAVENOUS | Status: AC
  Administered 2020-06-12: 750 mg via INTRAVENOUS
  Filled 2020-06-11: qty 150

## 2020-06-11 MED ORDER — INSULIN GLARGINE 100 UNIT/ML ~~LOC~~ SOLN
10.0000 [IU] | Freq: Every day | SUBCUTANEOUS | Status: DC
  Administered 2020-06-11 – 2020-06-15 (×5): 10 [IU] via SUBCUTANEOUS
  Filled 2020-06-11 (×7): qty 0.1

## 2020-06-11 MED ORDER — METHYLPREDNISOLONE SODIUM SUCC 40 MG IJ SOLR
20.0000 mg | Freq: Every day | INTRAMUSCULAR | Status: DC
  Administered 2020-06-12 – 2020-06-20 (×9): 20 mg via INTRAVENOUS
  Filled 2020-06-11 (×9): qty 1

## 2020-06-11 NOTE — Progress Notes (Signed)
Patients respirations labored,and becomes dyssynchronous with vent. Fentanyl and Versed bolus, and increased Versed dtt.

## 2020-06-11 NOTE — Progress Notes (Signed)
[place], [place] 06/11/20  Subjective:   Hospital day # 7 Remains sedated No acute events UOP approximately 800 cc as noted below Tolerated first hemodialysis well yesterday Sedated with fentanyl and Versed Getting tube feeds at 50 cc/h  Renal: 06/06 0701 - 06/07 0700 In: 3449.8 [I.V.:1022.7; NG/GT:1030.8; IV Piggyback:1296.3] Out: 1180 [Urine:830; Emesis/NG output:75; Stool:275] Lab Results  Component Value Date   CREATININE 4.98 (H) 06/11/2020   CREATININE 5.47 (H) 06/10/2020   CREATININE 3.82 (H) 06/09/2020     Objective:  Vital signs in last 24 hours:  Temp:  [97.7 F (36.5 C)-100.04 F (37.8 C)] 98.06 F (36.7 C) (06/07 0700) Pulse Rate:  [87-119] 96 (06/07 0700) Resp:  [12-19] 15 (06/07 0700) BP: (125-191)/(57-94) 164/67 (06/07 0700) SpO2:  [91 %-97 %] 95 % (06/07 0700) FiO2 (%):  [35 %-45 %] 35 % (06/07 0052) Weight:  [98.6 kg] 98.6 kg (06/07 0453)  Weight change: 3.1 kg Filed Weights   06/09/20 0500 06/10/20 0448 06/11/20 0453  Weight: 100.8 kg 95.5 kg 98.6 kg    Intake/Output:    Intake/Output Summary (Last 24 hours) at 06/11/2020 0931 Last data filed at 06/11/2020 3295 Gross per 24 hour  Intake 3005.24 ml  Output 1060 ml  Net 1945.24 ml    Physical Exam:  General: No acute distress, laying in the bed, critically ill-appearing  HEENT anicteric,  ETT, OG tube  Pulm/lungs coarse breath sounds, ventilator assisted  CVS/Heart regular rhythm, tachycardic  Abdomen:  Soft, nontender  Extremities: No peripheral edema  Neurologic: Sedated  Skin: Reddish rash diffusely over arms and legs, left forearm blanchable demarcated rash  Coud catheter in place  Rectal tube in place   Basic Metabolic Panel:  Recent Labs  Lab 06/06/20 1918 06/07/20 0516 06/07/20 1710 06/08/20 0534 06/08/20 1729 06/09/20 0511 06/10/20 0450 06/11/20 0449  NA  --  143  --  142  --  142 141 142  K  --  2.8* 3.3* 3.3*  --  3.2* 3.3* 3.8  CL   --  111  --  112*  --  112* 111 108  CO2  --  23  --  22  --  19* 18* 22  GLUCOSE  --  153*  --  199*  --  174* 154* 255*  BUN  --  29*  --  42*  --  61* 81* 86*  CREATININE  --  1.38*  --  2.16*  --  3.82* 5.47* 4.98*  CALCIUM  --  8.3*  --  8.3*  --  8.3* 8.0* 8.2*  MG  --   --  2.2 2.4 2.5* 2.5* 2.6* 2.6*  PHOS   < >  --  2.7 4.0 4.3 5.9* 7.7* 7.5*   < > = values in this interval not displayed.     CBC: Recent Labs  Lab 06/07/20 0516 06/08/20 0534 06/09/20 0511 06/10/20 0450 06/11/20 0449  WBC 7.7 7.0 5.8 6.8 10.1  NEUTROABS  --   --   --   --  9.1*  HGB 12.8* 11.8* 11.4* 10.5* 10.1*  HCT 37.2* 35.6* 34.1* 31.7* 29.4*  MCV 84.7 90.4 88.6 89.5 85.7  PLT 150 128* 122* 134* 167      Lab Results  Component Value Date   HEPBSAG NON REACTIVE 06/09/2020   HEPBSAB NON REACTIVE 06/09/2020      Microbiology:  Recent Results (from the past 240 hour(s))  Blood culture (single)     Status: Abnormal  Collection Time: 06/04/20  5:18 PM   Specimen: BLOOD  Result Value Ref Range Status   Specimen Description   Final    BLOOD BLOOD RIGHT HAND Performed at [facility], [address] [place], [place] [address]    Special Requests   Final    BOTTLES DRAWN AEROBIC AND ANAEROBIC Blood Culture adequate volume Performed at [facility], [address] [place], [place] [address]    Culture  Setup Time   Final    GRAM POSITIVE COCCI IN BOTH AEROBIC AND ANAEROBIC BOTTLES CRITICAL RESULT CALLED TO, READ BACK BY AND VERIFIED WITH: Elvera Maria 0923 06/06/2020 DLB Performed at [facility], [address] [place], [place] [address]    Culture (A)  Final    STAPHYLOCOCCUS EPIDERMIDIS THE SIGNIFICANCE OF ISOLATING THIS ORGANISM FROM A SINGLE SET OF BLOOD CULTURES WHEN MULTIPLE SETS ARE DRAWN IS UNCERTAIN. PLEASE NOTIFY THE MICROBIOLOGY DEPARTMENT WITHIN ONE WEEK IF SPECIATION AND SENSITIVITIES ARE REQUIRED. MICROCOCCUS SPECIES Standardized  susceptibility testing for this organism is not available. KOCURIA RHIZOPHILIA Performed at [facility], [place] [address] [place], [place] [address]    Report Status 06/10/2020 FINAL  Final  Resp Panel by RT-PCR (Flu A&B, Covid) Nasopharyngeal Swab     Status: None   Collection Time: 06/04/20  5:18 PM   Specimen: Nasopharyngeal Swab; Nasopharyngeal(NP) swabs in vial transport medium  Result Value Ref Range Status   SARS Coronavirus 2 by RT PCR NEGATIVE NEGATIVE Final    Comment: (NOTE) SARS-CoV-2 target nucleic acids are NOT DETECTED.  The SARS-CoV-2 RNA is generally detectable in upper respiratory specimens during the acute phase of infection. The lowest concentration of SARS-CoV-2 viral copies this assay can detect is 138 copies/mL. A negative result does not preclude SARS-Cov-2 infection and should not be used as the sole basis for treatment or other patient management decisions. A negative result may occur with  improper specimen collection/handling, submission of specimen other than nasopharyngeal swab, presence of viral mutation(s) within the areas targeted by this assay, and inadequate number of viral copies(<138 copies/mL). A negative result must be combined with clinical observations, patient history, and epidemiological information. The expected result is Negative.  Fact Sheet for Patients:  EntrepreneurPulse.com.au  Fact Sheet for Healthcare Providers:  IncredibleEmployment.be  This test is no t yet approved or cleared by the [place] FDA and  has been authorized for detection and/or diagnosis of SARS-CoV-2 by FDA under an Emergency Use Authorization (EUA). This EUA will remain  in effect (meaning this test can be used) for the duration of the COVID-19 declaration under Section 564(b)(1) of the Act, 21 U.S.C.section 360bbb-3(b)(1), unless the authorization is terminated  or revoked sooner.       Influenza A by PCR  NEGATIVE NEGATIVE Final   Influenza B by PCR NEGATIVE NEGATIVE Final    Comment: (NOTE) The Xpert Xpress SARS-CoV-2/FLU/RSV plus assay is intended as an aid in the diagnosis of influenza from Nasopharyngeal swab specimens and should not be used as a sole basis for treatment. Nasal washings and aspirates are unacceptable for Xpert Xpress SARS-CoV-2/FLU/RSV testing.  Fact Sheet for Patients: EntrepreneurPulse.com.au  Fact Sheet for Healthcare Providers: IncredibleEmployment.be  This test is not yet approved or cleared by the [place] FDA and has been authorized for detection and/or diagnosis of SARS-CoV-2 by FDA under an Emergency Use Authorization (EUA). This EUA will remain in effect (meaning this test can be used) for the duration of the COVID-19 declaration under  Section 564(b)(1) of the Act, 21 U.S.C. section 360bbb-3(b)(1), unless the authorization is terminated or revoked.  Performed at [facility], [address] [place], [place] [address]   Blood Culture ID Panel (Reflexed)     Status: Abnormal   Collection Time: 06/04/20  5:18 PM  Result Value Ref Range Status   Enterococcus faecalis NOT DETECTED NOT DETECTED Final   Enterococcus Faecium NOT DETECTED NOT DETECTED Final   Listeria monocytogenes NOT DETECTED NOT DETECTED Final   Staphylococcus species DETECTED (A) NOT DETECTED Final    Comment: CRITICAL RESULT CALLED TO, READ BACK BY AND VERIFIED WITH: [facility] [id number] 06/06/2020 DLB    Staphylococcus aureus (BCID) NOT DETECTED NOT DETECTED Final   Staphylococcus epidermidis DETECTED (A) NOT DETECTED Final    Comment: CRITICAL RESULT CALLED TO, READ BACK BY AND VERIFIED WITH: Elvera Maria [id number] 06/06/2020 DLB    Staphylococcus lugdunensis NOT DETECTED NOT DETECTED Final   Streptococcus species NOT DETECTED NOT DETECTED Final   Streptococcus agalactiae NOT DETECTED NOT DETECTED Final   Streptococcus pneumoniae NOT  DETECTED NOT DETECTED Final   Streptococcus pyogenes NOT DETECTED NOT DETECTED Final   A.calcoaceticus-baumannii NOT DETECTED NOT DETECTED Final   Bacteroides fragilis NOT DETECTED NOT DETECTED Final   Enterobacterales NOT DETECTED NOT DETECTED Final   Enterobacter cloacae complex NOT DETECTED NOT DETECTED Final   Escherichia coli NOT DETECTED NOT DETECTED Final   Klebsiella aerogenes NOT DETECTED NOT DETECTED Final   Klebsiella oxytoca NOT DETECTED NOT DETECTED Final   Klebsiella pneumoniae NOT DETECTED NOT DETECTED Final   Proteus species NOT DETECTED NOT DETECTED Final   Salmonella species NOT DETECTED NOT DETECTED Final   Serratia marcescens NOT DETECTED NOT DETECTED Final   Haemophilus influenzae NOT DETECTED NOT DETECTED Final   Neisseria meningitidis NOT DETECTED NOT DETECTED Final   Pseudomonas aeruginosa NOT DETECTED NOT DETECTED Final   Stenotrophomonas maltophilia NOT DETECTED NOT DETECTED Final   Candida albicans NOT DETECTED NOT DETECTED Final   Candida auris NOT DETECTED NOT DETECTED Final   Candida glabrata NOT DETECTED NOT DETECTED Final   Candida krusei NOT DETECTED NOT DETECTED Final   Candida parapsilosis NOT DETECTED NOT DETECTED Final   Candida tropicalis NOT DETECTED NOT DETECTED Final   Cryptococcus neoformans/gattii NOT DETECTED NOT DETECTED Final   Methicillin resistance mecA/C NOT DETECTED NOT DETECTED Final    Comment: Performed at [facility], [address] [place], [place] [address]  Blood culture (single)     Status: None   Collection Time: 06/04/20  9:48 PM   Specimen: BLOOD  Result Value Ref Range Status   Specimen Description BLOOD BLOOD RIGHT ARM  Final   Special Requests   Final    BOTTLES DRAWN AEROBIC AND ANAEROBIC Blood Culture adequate volume   Culture   Final    NO GROWTH 5 DAYS Performed at [facility], [address] [place], [place] [address]    Report Status 06/09/2020 FINAL  Final  Urine Culture     Status:  None   Collection Time: 06/06/20  2:37 PM   Specimen: Urine, Random  Result Value Ref Range Status   Specimen Description   Final    URINE, RANDOM Performed at [facility], [address] [place], [place] [address]    Special Requests   Final    NONE Performed at [facility], [address] [place], [place] [address]    Culture   Final    NO GROWTH  Performed at [facility], [place] [address], [place], [place] [address]    Report Status 06/08/2020 FINAL  Final  MRSA PCR Screening     Status: None   Collection Time: 06/06/20  2:40 PM   Specimen: Nasopharyngeal  Result Value Ref Range Status   MRSA by PCR NEGATIVE NEGATIVE Final    Comment:        The GeneXpert MRSA Assay (FDA approved for NASAL specimens only), is one component of a comprehensive MRSA colonization surveillance program. It is not intended to diagnose MRSA infection nor to guide or monitor treatment for MRSA infections. Performed at [facility], [place], [place] [address]   Acid Fast Smear (AFB)     Status: None   Collection Time: 06/06/20  6:30 PM   Specimen: Bronchoalveolar Lavage; Sputum  Result Value Ref Range Status   AFB Specimen Processing Concentration  Final   Acid Fast Smear Negative  Final    Comment: (NOTE) Performed At: [facility] [place], [place] [id number] Rush Farmer MD UD:[id number]    Source (AFB) BRONCHIAL ALVEOLAR LAVAGE  Corrected    Comment: Performed at [facility], [address] [place], [place] [address] CORRECTED ON 06/02 AT 2239: PREVIOUSLY REPORTED AS SPUTUM   Culture, Respiratory w Gram Stain     Status: None   Collection Time: 06/06/20  6:30 PM   Specimen: Bronchoalveolar Lavage  Result Value Ref Range Status   Specimen Description   Final    BRONCHIAL ALVEOLAR LAVAGE Performed at [facility], [address] [place], [place] [address]    Special Requests   Final     NONE Performed at [facility], [address] [place], [place] [address]    Gram Stain   Final    FEW WBC PRESENT,BOTH PMN AND MONONUCLEAR NO ORGANISMS SEEN    Culture   Final    NO GROWTH 3 DAYS Performed at [facility], [place] [address], [place], [place] [address]    Report Status 06/09/2020 FINAL  Final  Respiratory (~20 pathogens) panel by PCR     Status: None   Collection Time: 06/07/20  5:50 AM   Specimen: Nasopharyngeal Swab; Respiratory  Result Value Ref Range Status   Adenovirus NOT DETECTED NOT DETECTED Final   Coronavirus 229E NOT DETECTED NOT DETECTED Final    Comment: (NOTE) The Coronavirus on the Respiratory Panel, DOES NOT test for the novel  Coronavirus (2019 nCoV)    Coronavirus HKU1 NOT DETECTED NOT DETECTED Final   Coronavirus NL63 NOT DETECTED NOT DETECTED Final   Coronavirus OC43 NOT DETECTED NOT DETECTED Final   Metapneumovirus NOT DETECTED NOT DETECTED Final   Rhinovirus / Enterovirus NOT DETECTED NOT DETECTED Final   Influenza A NOT DETECTED NOT DETECTED Final   Influenza B NOT DETECTED NOT DETECTED Final   Parainfluenza Virus 1 NOT DETECTED NOT DETECTED Final   Parainfluenza Virus 2 NOT DETECTED NOT DETECTED Final   Parainfluenza Virus 3 NOT DETECTED NOT DETECTED Final   Parainfluenza Virus 4 NOT DETECTED NOT DETECTED Final   Respiratory Syncytial Virus NOT DETECTED NOT DETECTED Final   Bordetella pertussis NOT DETECTED NOT DETECTED Final   Bordetella Parapertussis NOT DETECTED NOT DETECTED Final   Chlamydophila pneumoniae NOT DETECTED NOT DETECTED Final   Mycoplasma pneumoniae NOT DETECTED NOT DETECTED Final    Comment: Performed at [facility], [place] [address] [place],  [address]  Coagulation Studies: No results for input(s): LABPROT, INR in the last 72 hours.  Urinalysis: Recent Labs    06/09/20 0901  COLORURINE YELLOW*  LABSPEC 1.015  PHURINE 5.0  GLUCOSEU NEGATIVE  HGBUR MODERATE*  BILIRUBINUR  NEGATIVE  KETONESUR NEGATIVE  PROTEINUR 100*  NITRITE NEGATIVE  LEUKOCYTESUR NEGATIVE      Imaging: DG Chest Port 1 View  Result Date: 06/10/2020 CLINICAL DATA:  Central line placement. EXAM: PORTABLE CHEST 1 VIEW COMPARISON:  06/07/2020 FINDINGS: The endotracheal tube is 4 cm above the carina. The NG tube is coursing down the esophagus and into the stomach. Right PICC line tip is in the mid SVC. Left IJ central venous catheter tip is in the SVC just below the brachiocephalic SVC junction. Persistent and progressive opacification of the left hemithorax. IMPRESSION: 1. Support apparatus in good position without complicating features. 2. Persistent and progressive opacification of the left hemithorax. Electronically Signed   By: Marijo Sanes M.D.   On: 06/10/2020 12:02     Medications:   . sodium chloride 5 mL/hr at 06/11/20 0743  . azithromycin Stopped (06/10/20 2304)  . doxycycline (VIBRAMYCIN) IV 100 mg (06/11/20 0919)  . feeding supplement (VITAL HIGH PROTEIN) 60 mL/hr at 06/11/20 0923  . fentaNYL infusion INTRAVENOUS 300 mcg/hr (06/11/20 0743)  . meropenem (MERREM) IV    . midazolam 5 mg/hr (06/11/20 0743)   . budesonide (PULMICORT) nebulizer solution  0.5 mg Nebulization BID  . chlorhexidine gluconate (MEDLINE KIT)  15 mL Mouth Rinse BID  . Chlorhexidine Gluconate Cloth  6 each Topical Daily  . feeding supplement (PROSource TF)  45 mL Per Tube Daily  . free water  100 mL Per Tube Q4H  . heparin injection (subcutaneous)  5,000 Units Subcutaneous Q8H  . insulin aspart  0-20 Units Subcutaneous Q4H  . insulin glargine  10 Units Subcutaneous Daily  . ipratropium-albuterol  3 mL Nebulization Q4H  . mouth rinse  15 mL Mouth Rinse 10 times per day  . methylPREDNISolone (SOLU-MEDROL) injection  20 mg Intravenous BID  . multivitamin  1 tablet Per Tube QHS  . pantoprazole (PROTONIX) IV  40 mg Intravenous Q24H  . prazosin  4 mg Per Tube QHS  . sodium bicarbonate  1,300 mg Per Tube BID   . sodium chloride flush  10-40 mL Intracatheter Q12H  . traZODone  150 mg Per Tube QHS   sodium chloride, fentaNYL, heparin, labetalol, midazolam, ondansetron **OR** ondansetron (ZOFRAN) IV, sodium chloride flush  Assessment/ Plan:  60 y.o. male with  medical problems of   Hypertension, PTSD, diabetes, migraines, GERD, neurogenic bladder secondary to remote history of transverse myelitis, requires self-catheterization and has frequent UTIs, opioids for chronic pain  admitted on 06/04/2020 for Sepsis ([place]) [A41.9] Altered mental status, unspecified altered mental status type [R41.82] Community acquired pneumonia, unspecified laterality [J18.9]   #Acute kidney injury, proteinuria, hematuria Baseline creatinine of 1.26 at admission Increasing creatinine trends since June 3. AKI is likely multifactorial secondary to ongoing sepsis as well as IV contrast exposure on June 3 Urinalysis at admission showed glucosuria, greater than 300 proteinuria, 6-10 RBCs, moderate hemoglobin noted on dipstick, 11-20 WBCs Renal imaging as noted above.  Patient had CT abdomen pelvis without contrast.  No hydronephrosis.  Bladder was unremarkable. UPC 2.55 gm RBC 21-50 cc June 09, 2020: ANA negative, ESR normal, complement C3-C4 normal, kappa lambda ratio normal, hepatitis B and hepatitis C studies negative, HIV negative, crypto antigen negative Hemodialysis started on June 6  Plan: Patient tolerated  his dialysis treatment well.  No volume was removed Electrolytes and volume status are acceptable today.  No acute indication for dialysis at present.  We will continue to monitor on a daily basis for need of further dialysis Continue to monitor urine output as well as electrolytes, closely  #Acute respiratory failure Currently requiring ventilator support FiO2 35%,      LOS: [place] 6/7/20229:31 AM  [place], [place]  Note: This note was prepared  with Dragon dictation. Any transcription errors are unintentional

## 2020-06-11 NOTE — Consult Note (Signed)
PHARMACY CONSULT NOTE  Pharmacy Consult for Electrolyte Monitoring and Replacement   Recent Labs: Potassium (mmol/L)  Date Value  06/11/2020 3.8   Magnesium (mg/dL)  Date Value  78/29/5621 2.6 (H)   Calcium (mg/dL)  Date Value  30/86/5784 8.2 (L)   Albumin (g/dL)  Date Value  69/62/9528 2.4 (L)   Phosphorus (mg/dL)  Date Value  41/32/4401 7.5 (H)   Sodium (mmol/L)  Date Value  06/11/2020 142   Assessment: Patient is a 60 y/o M with medical history including diabetes, migraines, GERD, neurogenic bladder, HTN, chronic opioid use who is admitted with sepsis secondary to pneumonia. Patient is admitted to the ICU and is currently intubated, sedated, and on mechanical ventilation.   Pharmacy consulted to assist with electrolyte monitoring and replacement as indicated. Patient started on HD, first treatment 6/6. Planning to assess daily for HD needs.  Nutrition: tube feeds + free water flushes 100 mL q4h  Goal of Therapy:  Electrolytes within normal limits  Plan:  --No replacement indicated today --Monitor electrolytes with AM labs  Pricilla Riffle, PharmD 06/11/2020 3:20 PM

## 2020-06-11 NOTE — Progress Notes (Signed)
Inpatient Diabetes Program Recommendations  AACE/ADA: New Consensus Statement on Inpatient Glycemic Control (2015)  Target Ranges:  Prepandial:   less than 140 mg/dL      Peak postprandial:   less than 180 mg/dL (1-2 hours)      Critically ill patients:  140 - 180 mg/dL   Results for Wyatt Ball, Wyatt Ball (MRN [id number]) as of 06/11/2020 07:45  Ref. Range 06/09/2020 23:25 06/10/2020 03:10 06/10/2020 07:11 06/10/2020 12:14 06/10/2020 15:33 06/10/2020 19:46  Glucose-Capillary Latest Ref Range: 70 - 99 mg/dL 694 (H) 503 (H) 888 (H) 190 (H) 211 (H) 249 (H)   Results for Wyatt Ball, Wyatt Ball (MRN [id number]) as of 06/11/2020 07:45  Ref. Range 06/10/2020 23:32 06/11/2020 03:16 06/11/2020 07:14  Glucose-Capillary Latest Ref Range: 70 - 99 mg/dL 915 (H)  7 units NOVOLOG  242 (H)  7 units NOVOLOG  251 (H)  11 units NOVOLOG     Home DM Meds: Alogliptin 12.5 mg Daily       Metformin 1000 mg BID  Current Orders: Lantus 10 units Daily      Novolog Resistant Correction Scale/ SSI (0-20 units) Q4 hours    MD- Note Solumedrol started yesterday AM and also note that Tube Feedings increased to 60 cc/hr yesterday.  Lantus 10 units Daily to start this AM.  CBGs have been on the rise since the addition of the Solumedrol and increase of the tube feedings.  In addition to the Lantus that was added this AM, please also consider adding Novolog Tube Feed Coverage:  Novolog 3 units Q4 hours  HOLD if tube feeds HELD for any reason    --Will follow patient during hospitalization--  Ambrose Finland RN, MSN, CDE Diabetes Coordinator Inpatient Glycemic Control Team Team Pager: [phone] (8a-5p)

## 2020-06-11 NOTE — Progress Notes (Signed)
Date of Admission:  06/04/2020      ID: Wyatt Ball. is a 60 y.o. male Principal Problem:   CAP (community acquired pneumonia) Active Problems:   Acute metabolic encephalopathy   Sepsis ([place])   Neurogenic bladder   Chronic, continuous use of opioids   Chronic low back pain   Type 2 diabetes mellitus without complication (HCC)   Migraines   Recurrent UTI   History of colon polyps   Urinary retention   Altered mental status    Subjective: Remains intubated  Medications:  . budesonide (PULMICORT) nebulizer solution  0.5 mg Nebulization BID  . chlorhexidine gluconate (MEDLINE KIT)  15 mL Mouth Rinse BID  . Chlorhexidine Gluconate Cloth  6 each Topical Daily  . feeding supplement (PROSource TF)  45 mL Per Tube Daily  . free water  100 mL Per Tube Q4H  . heparin injection (subcutaneous)  5,000 Units Subcutaneous Q8H  . insulin aspart  0-20 Units Subcutaneous Q4H  . insulin glargine  10 Units Subcutaneous Daily  . ipratropium-albuterol  3 mL Nebulization Q4H  . mouth rinse  15 mL Mouth Rinse 10 times per day  . [START ON 06/12/2020] methylPREDNISolone (SOLU-MEDROL) injection  20 mg Intravenous Daily  . multivitamin  1 tablet Per Tube QHS  . pantoprazole (PROTONIX) IV  40 mg Intravenous Q24H  . prazosin  4 mg Per Tube QHS  . sodium bicarbonate  1,300 mg Per Tube BID  . sodium chloride flush  10-40 mL Intracatheter Q12H  . traZODone  150 mg Per Tube QHS    Objective: Vital signs in last 24 hours: Patient Vitals for the past 24 hrs:  BP Temp Temp src Pulse Resp SpO2 Weight  06/11/20 1645 -- -- -- -- -- 94 % --  06/11/20 1600 (!) 149/62 99.32 F (37.4 C) -- 97 15 95 % --  06/11/20 1500 (!) 150/65 98.96 F (37.2 C) -- 93 15 95 % --  06/11/20 1400 (!) 167/65 (!) 96.8 F (36 C) -- (!) 110 19 93 % --  06/11/20 1300 (!) 158/68 97.7 F (36.5 C) -- (!) 108 20 91 % --  06/11/20 1200 (!) 172/68 97.7 F (36.5 C) Esophageal (!) 104 18 90 % --  06/11/20 1141 -- -- --  -- -- 94 % --  06/11/20 1100 (!) 150/60 98.24 F (36.8 C) -- 95 16 94 % --  06/11/20 1000 (!) 138/58 98.06 F (36.7 C) -- 89 15 93 % --  06/11/20 0900 (!) 167/60 98.24 F (36.8 C) -- (!) 106 17 -- --  06/11/20 0810 -- -- -- -- -- 93 % --  06/11/20 0800 (!) 149/57 97.88 F (36.6 C) Esophageal 94 15 93 % --  06/11/20 0700 (!) 164/67 98.06 F (36.7 C) -- 96 15 95 % --  06/11/20 0600 (!) 145/58 98.06 F (36.7 C) -- 95 15 93 % --  06/11/20 0554 (!) 151/57 -- -- -- -- -- --  06/11/20 0516 (!) 175/62 -- -- 98 16 94 % --  06/11/20 0500 (!) 191/91 98.42 F (36.9 C) -- (!) 110 18 94 % --  06/11/20 0453 -- -- -- -- -- -- 98.6 kg  06/11/20 0400 (!) 162/66 98.42 F (36.9 C) Esophageal 98 18 91 % --  06/11/20 0300 (!) 154/62 98.42 F (36.9 C) -- 93 16 91 % --  06/11/20 0200 (!) 160/66 98.6 F (37 C) -- 95 15 94 % --  06/11/20 0100 (!) 157/68 98.78 F (  37.1 C) -- (!) 102 17 95 % --  06/11/20 0052 -- -- -- -- -- 95 % --  06/11/20 0000 (!) 171/71 98.96 F (37.2 C) -- (!) 115 19 94 % --  06/10/20 2300 (!) 164/62 98.78 F (37.1 C) -- (!) 119 18 94 % --  06/10/20 2200 (!) 164/70 98.96 F (37.2 C) -- (!) 101 16 94 % --  06/10/20 2102 -- -- -- -- -- 97 % --  06/10/20 2100 (!) 155/68 98.42 F (36.9 C) -- 93 13 95 % --  06/10/20 2000 (!) 178/94 98.24 F (36.8 C) -- 96 16 96 % --  06/10/20 1900 (!) 151/65 98.24 F (36.8 C) -- 94 15 96 % --   PHYSICAL EXAM:  General: intubated Left IJ foley Lungs: b/l air entry- crepts  Heart: Tachycardia Abdomen: Soft, non-tender,not distended. Bowel sounds normal. No masses Extremities: atraumatic, no cyanosis. No edema. No clubbing Skin: blotchiness over knees, chest wall Lymph: Cervical, supraclavicular normal. Neurologic: cannot assess  Lab Results Recent Labs    06/10/20 0450 06/11/20 0449  WBC 6.8 10.1  HGB 10.5* 10.1*  HCT 31.7* 29.4*  NA 141 142  K 3.3* 3.8  CL 111 108  CO2 18* 22  BUN 81* 86*  CREATININE 5.47* 4.98*   Liver  Panel Recent Labs    06/09/20 0511 06/10/20 0450 06/11/20 0449  PROT 4.8*  --   --   ALBUMIN 1.7* 1.6* 2.4*  AST 80*  --   --   ALT 68*  --   --   ALKPHOS 93  --   --   BILITOT 0.9  --   --   BILIDIR 0.1  --   --   IBILI 0.8  --   --    Sedimentation Rate No results for input(s): ESRSEDRATE in the last 72 hours. C-Reactive Protein No results for input(s): CRP in the last 72 hours.  Microbiology:  Studies/Results: DG Chest Port 1 View  Result Date: 06/10/2020 CLINICAL DATA:  Central line placement. EXAM: PORTABLE CHEST 1 VIEW COMPARISON:  06/07/2020 FINDINGS: The endotracheal tube is 4 cm above the carina. The NG tube is coursing down the esophagus and into the stomach. Right PICC line tip is in the mid SVC. Left IJ central venous catheter tip is in the SVC just below the brachiocephalic SVC junction. Persistent and progressive opacification of the left hemithorax. IMPRESSION: 1. Support apparatus in good position without complicating features. 2. Persistent and progressive opacification of the left hemithorax. Electronically Signed   By: Marijo Sanes M.D.   On: 06/10/2020 12:02     Assessment/Plan:  Acute hypoxic respiratory failure due to left sided pneumonia  On meropenem and azithromycin ( will change these to levaquin) Doxy added yesterday for possible TBI  Encephalopathy- MRI no focal lesion  AKI- combination of sepsis and contrast Got HD yesterday  Smoker  Neurogenic bladder  H/o transverse myelitis  Blotchy rash over knees /chest- not typical of drug ras. observe  Discussed with care team

## 2020-06-11 NOTE — Progress Notes (Signed)
NAME:  Wyatt Ball., MRN:  [id number], DOB:  Jul 11, 1960, LOS: 7 ADMISSION DATE:  06/04/2020  60 y.o.malewith medical history significant forHTN, PTSD, diabetes,migraines,GERD,neurogenic bladder secondary to remote history of transverse myelitis,  who self catheterizes and has frequent UTIsas well aschronic back pain on chronic opiates who at baselineis independent -brought to the ER with a 3-day history of altered mental status.  +cough,shortness of breathand feveras well asvomiting and diarrhea and started becoming increasingly confused in the 24 hours prior to arrival.   Unable to get any meaningful history from patient.  ED course: On arrival, temperature 99.3, BP 167/67, pulse 99, respirations 18 with O2 sat 96% on room air. Blood work significant for leukocytosis of 13,600 with lactic acid of 1.2. Ammonia level normal at 15, EtOH less than 10. Sodium 130, potassium 3.2, glucose 249 creatinine 1.06. Urinalysis with 80 ketones and rare bacteria. UDS positive for opiates and tricyclic's. COVID and flu negative    Imaging:   CT chest abdomen and pelvis without contrast consistent with pneumonia. Multiple nodules left upper lobe likely infectious or inflammatory with follow-up imaging recommended to document resolution. Cardiomegaly with trace pericardial effusion  Patient was started on sepsis fluids and broad-spectrum antibiotics initially for sepsis of unknown source.   Significant Hospital Events: Including procedures, antibiotic start and stop dates in addition to other pertinent events   5/31 admitted for sepsis present on admission for Left lung pneumonia  6/1 progressive confusion and hypoxia  6/2 transferred to [facility]  6/3 high fevers, remains ill, SELF EXTUBATED  6/3 emergently re-intubated  6/5 failure to wean from v ent  6/6 failure to wean from vent, VASC CATH PLACED  6/7 HD started remains  critically ill     MICRO DATA COVID INF A/B NEG MRSA NEG BAL CULTURES PENDING  Antibiotics Given (last 72 hours)    Date/Time Action Medication Dose Rate   06/08/20 2157 New Bag/Given   meropenem (MERREM) 1 g in sodium chloride 0.9 % 100 mL IVPB 1 g 200 mL/hr   06/08/20 2238 New Bag/Given   azithromycin (ZITHROMAX) 500 mg in sodium chloride 0.9 % 250 mL IVPB 500 mg 250 mL/hr   06/09/20 1320 New Bag/Given   meropenem (MERREM) 500 mg in sodium chloride 0.9 % 100 mL IVPB 500 mg 200 mL/hr   06/09/20 2143 New Bag/Given   azithromycin (ZITHROMAX) 500 mg in sodium chloride 0.9 % 250 mL IVPB 500 mg 250 mL/hr   06/10/20 0031 New Bag/Given   meropenem (MERREM) 500 mg in sodium chloride 0.9 % 100 mL IVPB 500 mg 200 mL/hr   06/10/20 1310 New Bag/Given   doxycycline (VIBRAMYCIN) 100 mg in sodium chloride 0.9 % 250 mL IVPB 100 mg 125 mL/hr   06/10/20 1317 New Bag/Given   meropenem (MERREM) 500 mg in sodium chloride 0.9 % 100 mL IVPB 500 mg 200 mL/hr   06/10/20 2203 New Bag/Given   azithromycin (ZITHROMAX) 500 mg in sodium chloride 0.9 % 250 mL IVPB 500 mg 250 mL/hr   06/11/20 0005 New Bag/Given   doxycycline (VIBRAMYCIN) 100 mg in sodium chloride 0.9 % 250 mL IVPB 100 mg 125 mL/hr   06/11/20 0103 New Bag/Given   meropenem (MERREM) 500 mg in sodium chloride 0.9 % 100 mL IVPB 500 mg 200 mL/hr        Interim History / Subjective:  Remains on vent vasc cath placed for HD Multiorgan failure         Objective  Blood pressure (!) 164/67, pulse 96, temperature 98.06 F (36.7 C), resp. rate 15, height _0  (1.676 m), weight 98.6 kg, SpO2 95 %.    Vent Mode: PRVC FiO2 (%):  [35 %-45 %] 35 % Set Rate:  [16 bmp] 16 bmp Vt Set:  [520 mL] 520 mL PEEP:  [5 cmH20-10 cmH20] 10 cmH20 Plateau Pressure:  [22 cmH20] 22 cmH20   Intake/Output Summary (Last 24 hours) at 06/11/2020 0731 Last data filed at 06/11/2020 0600 Gross per 24 hour  Intake 3399.84 ml  Output 1180 ml  Net 2219.84 ml    Filed Weights   06/09/20 0500 06/10/20 0448 06/11/20 0453  Weight: 100.8 kg 95.5 kg 98.6 kg      REVIEW OF SYSTEMS  PATIENT IS UNABLE TO PROVIDE COMPLETE REVIEW OF SYSTEMS DUE TO SEVERE CRITICAL ILLNESS AND TOXIC METABOLIC ENCEPHALOPATHY   PHYSICAL EXAMINATION:  GENERAL:critically ill appearing, +resp distress HEAD: Normocephalic, atraumatic.  EYES: Pupils equal, round, reactive to light.  No scleral icterus.  MOUTH: Moist mucosal membrane. NECK: Supple. PULMONARY: +rhonchi, +wheezing CARDIOVASCULAR: S1 and S2. Regular rate and rhythm. No murmurs, rubs, or gallops.  GASTROINTESTINAL: Soft, nontender, -distended. Positive bowel sounds.  MUSCULOSKELETAL: No swelling, clubbing, or edema.  NEUROLOGIC: obtunded SKIN:intact,warm,dry   Labs/imaging that I havepersonally reviewed  (right click and "Reselect all SmartList Selections" daily)      ASSESSMENT AND PLAN SYNOPSIS    60 yo white make with acute and severe hypoxic respiratory failure with acute and severe toxic metabolic encephalopathy with acute COPD ExacerbationLUL/LL multifocal pneumonia Self extubated and re intubated emergently, now with progressive renal failure    Severe ACUTE Hypoxic and Hypercapnic Respiratory Failure -continue Mechanical Ventilator support -continue Bronchodilator Therapy -Wean Fio2 and PEEP as tolerated -VAP/VENT bundle implementation -will NOT perform SAT/SBT   SEVERE COPD EXACERBATION -continue IV steroids as prescribed -continue NEB THERAPY as prescribed -morphine as needed -wean fio2 as needed and tolerated    CARDIAC FAILURE-acute combined systolic/diastolic dysfunction -oxygen as needed -Lasix as tolerated -follow up cardiac enzymes as indicated   CARDIAC ICU monitoring   ACUTE KIDNEY INJURY/Renal Failure -continue Foley Catheter-assess need -Avoid nephrotoxic agents -Follow urine output, BMP -Ensure adequate renal perfusion, optimize oxygenation -Renal  dose medications Follow Nephrology recs Vasculitis work up pending HD as needed  NEUROLOGY Acute toxic metabolic encephalopathy, need for sedation Goal RASS -2 to -3   SEPTIC SHOCK -use vasopressors to keep MAP>65 as needed -follow ABG and LA -follow up cultures -emperic ABX  INFECTIOUS DISEASE -continue antibiotics as prescribed -follow up cultures -follow up ID consultation MRSA NEG RVP NEGATIVE COVID NEG INF AB NEG SPUTUM CX NG AFB SMEAR NEG HIV NEG    ENDO - ICU hypoglycemic\Hyperglycemia protocol -check FSBS per protocol   GI GI PROPHYLAXIS as indicated  NUTRITIONAL STATUS DIET-->TF's as tolerated Constipation protocol as indicated   ELECTROLYTES -follow labs as needed -replace as needed -pharmacy consultation and following     Best practice (right click and "Reselect all SmartList Selections" daily)  Diet:NPO Pain/Anxiety/Delirium protocol (if indicated):Yes(RASS goal -2) VAP protocol (if indicated):Yes DVT prophylaxis:Subcutaneous Heparin GI prophylaxis:H2B Glucose control:SSIYes Central venous access:N/A Arterial line:N/A Foley:N/A Mobility:bed rest PT consulted:N/A Code Status:full code Disposition:ICU    Labs   CBC: Recent Labs  Lab 06/07/20 0516 06/08/20 0534 06/09/20 0511 06/10/20 0450 06/11/20 0449  WBC 7.7 7.0 5.8 6.8 10.1  NEUTROABS  --   --   --   --  9.1*  HGB 12.8* 11.8* 11.4* 10.5* 10.1*  HCT  37.2* 35.6* 34.1* 31.7* 29.4*  MCV 84.7 90.4 88.6 89.5 85.7  PLT 150 128* 122* 134* 989    Basic Metabolic Panel: Recent Labs  Lab 06/06/20 1918 06/07/20 0516 06/07/20 1710 06/08/20 0534 06/08/20 1729 06/09/20 0511 06/10/20 0450 06/11/20 0449  NA  --  143  --  142  --  142 141 142  K  --  2.8* 3.3* 3.3*  --  3.2* 3.3* 3.8  CL  --  111  --  112*  --  112* 111 108  CO2  --  23  --  22  --  19* 18* 22  GLUCOSE  --  153*  --  199*  --  174* 154* 255*  BUN  --  29*  --  42*  --  61* 81* 86*   CREATININE  --  1.38*  --  2.16*  --  3.82* 5.47* 4.98*  CALCIUM  --  8.3*  --  8.3*  --  8.3* 8.0* 8.2*  MG  --   --  2.2 2.4 2.5* 2.5* 2.6* 2.6*  PHOS   < >  --  2.7 4.0 4.3 5.9* 7.7* 7.5*   < > = values in this interval not displayed.   GFR: Estimated Creatinine Clearance: 17.1 mL/min (A) (by C-G formula based on SCr of 4.98 mg/dL (H)). Recent Labs  Lab 06/04/20 1718 06/04/20 2148 06/05/20 0716 06/05/20 0729 06/05/20 2119 06/06/20 1918 06/08/20 0534 06/09/20 0511 06/10/20 0450 06/11/20 0449  PROCALCITON  --  0.45  --   --  4.33  --   --   --   --   --   WBC 13.6*  --    < >  --   --    < > 7.0 5.8 6.8 10.1  LATICACIDVEN 1.2 3.1*  --  1.9 1.5  --   --   --   --   --    < > = values in this interval not displayed.    Liver Function Tests: Recent Labs  Lab 06/04/20 1718 06/06/20 1918 06/09/20 0511 06/10/20 0450 06/11/20 0449  AST 14* 191* 80*  --   --   ALT 11 110* 68*  --   --   ALKPHOS 61 68 93  --   --   BILITOT 1.0 1.4* 0.9  --   --   PROT 6.9 6.7 4.8*  --   --   ALBUMIN 3.4* 2.9* 1.7* 1.6* 2.4*   No results for input(s): LIPASE, AMYLASE in the last 168 hours. Recent Labs  Lab 06/04/20 1718  AMMONIA 15    ABG    Component Value Date/Time   PHART 7.33 (L) 06/08/2020 0500   PCO2ART 40 06/08/2020 0500   PO2ART 99 06/08/2020 0500   HCO3 21.1 06/08/2020 0500   ACIDBASEDEF 4.5 (H) 06/08/2020 0500   O2SAT 97.2 06/08/2020 0500     Coagulation Profile: Recent Labs  Lab 06/05/20 0838  INR 1.2    Cardiac Enzymes: Recent Labs  Lab 06/06/20 1619 06/09/20 0901  CKTOTAL 85 506*    HbA1C: Hgb A1c MFr Bld  Date/Time Value Ref Range Status  06/04/2020 09:48 PM 7.4 (H) 4.8 - 5.6 % Final    Comment:    (NOTE)         Prediabetes: 5.7 - 6.4         Diabetes: >6.4         Glycemic control for adults with diabetes: <7.0  CBG: Recent Labs  Lab 06/10/20 1533 06/10/20 1946 06/10/20 2332 06/11/20 0316 06/11/20 0714  GLUCAP 211* 249* 248*  242* 251*    Allergies No Known Allergies     DVT/GI PRX  assessed I Assessed the need for Labs I Assessed the need for Foley I Assessed the need for Central Venous Line Family Discussion when available I Assessed the need for Mobilization I made an Assessment of medications to be adjusted accordingly Safety Risk assessment completed  CASE DISCUSSED IN MULTIDISCIPLINARY ROUNDS WITH ICU TEAM     Critical Care Time devoted to patient care services described in this note is 55  minutes.  Critical care was necessary to treat or prevent imminent or life-threatening deterioration. Overall, patient is critically ill, prognosis is guarded.  Patient with Multiorgan failure and at high risk for cardiac arrest and death.    Corrin Parker, M.D.  [facility]  Medical Director [place] Director [facility] Cardio-Pulmonary Department

## 2020-06-12 DIAGNOSIS — J189 Pneumonia, unspecified organism: Secondary | ICD-10-CM | POA: Diagnosis not present

## 2020-06-12 DIAGNOSIS — G9341 Metabolic encephalopathy: Secondary | ICD-10-CM | POA: Diagnosis not present

## 2020-06-12 LAB — CBC WITH DIFFERENTIAL/PLATELET
Abs Immature Granulocytes: 0.2 10*3/uL — ABNORMAL HIGH (ref 0.00–0.07)
Basophils Absolute: 0 10*3/uL (ref 0.0–0.1)
Basophils Relative: 0 %
Eosinophils Absolute: 0.2 10*3/uL (ref 0.0–0.5)
Eosinophils Relative: 3 %
HCT: 28.8 % — ABNORMAL LOW (ref 39.0–52.0)
Hemoglobin: 9.6 g/dL — ABNORMAL LOW (ref 13.0–17.0)
Immature Granulocytes: 3 %
Lymphocytes Relative: 10 %
Lymphs Abs: 0.8 10*3/uL (ref 0.7–4.0)
MCH: 29.3 pg (ref 26.0–34.0)
MCHC: 33.3 g/dL (ref 30.0–36.0)
MCV: 87.8 fL (ref 80.0–100.0)
Monocytes Absolute: 0.6 10*3/uL (ref 0.1–1.0)
Monocytes Relative: 8 %
Neutro Abs: 5.7 10*3/uL (ref 1.7–7.7)
Neutrophils Relative %: 76 %
Platelets: 205 10*3/uL (ref 150–400)
RBC: 3.28 MIL/uL — ABNORMAL LOW (ref 4.22–5.81)
RDW: 16.4 % — ABNORMAL HIGH (ref 11.5–15.5)
WBC: 7.5 10*3/uL (ref 4.0–10.5)
nRBC: 0 % (ref 0.0–0.2)

## 2020-06-12 LAB — BASIC METABOLIC PANEL
Anion gap: 9 (ref 5–15)
BUN: 112 mg/dL — ABNORMAL HIGH (ref 8–23)
CO2: 22 mmol/L (ref 22–32)
Calcium: 8 mg/dL — ABNORMAL LOW (ref 8.9–10.3)
Chloride: 111 mmol/L (ref 98–111)
Creatinine, Ser: 6.02 mg/dL — ABNORMAL HIGH (ref 0.61–1.24)
GFR, Estimated: 10 mL/min — ABNORMAL LOW (ref >60)
Glucose, Bld: 214 mg/dL — ABNORMAL HIGH (ref 70–99)
Potassium: 3.6 mmol/L (ref 3.5–5.1)
Sodium: 142 mmol/L (ref 135–145)

## 2020-06-12 LAB — MISC LABCORP TEST (SEND OUT): Labcorp test code: 141330

## 2020-06-12 LAB — ANCA TITERS
Atypical P-ANCA titer: <1:20 {titer}
C-ANCA: <1:20 {titer}
P-ANCA: <1:20 {titer}

## 2020-06-12 LAB — GLUCOSE, CAPILLARY
Glucose-Capillary: 211 mg/dL — ABNORMAL HIGH (ref 70–99)
Glucose-Capillary: 187 mg/dL — ABNORMAL HIGH (ref 70–99)
Glucose-Capillary: 194 mg/dL — ABNORMAL HIGH (ref 70–99)
Glucose-Capillary: 252 mg/dL — ABNORMAL HIGH (ref 70–99)
Glucose-Capillary: 240 mg/dL — ABNORMAL HIGH (ref 70–99)
Glucose-Capillary: 246 mg/dL — ABNORMAL HIGH (ref 70–99)

## 2020-06-12 MED ORDER — HYDRALAZINE HCL 20 MG/ML IJ SOLN
INTRAMUSCULAR | Status: AC
  Filled 2020-06-12: qty 1

## 2020-06-12 MED ORDER — DIAZEPAM 2 MG PO TABS
5.0000 mg | ORAL_TABLET | Freq: Four times a day (QID) | ORAL | Status: DC
  Administered 2020-06-12 (×3): 5 mg
  Filled 2020-06-12 (×3): qty 3

## 2020-06-12 MED ORDER — OXYCODONE HCL 5 MG/5ML PO SOLN
10.0000 mg | Freq: Four times a day (QID) | ORAL | Status: DC
Start: 2020-06-12 — End: 2020-06-15
  Administered 2020-06-12 – 2020-06-14 (×11): 10 mg
  Filled 2020-06-12 (×12): qty 10

## 2020-06-12 MED ORDER — LEVOFLOXACIN IN D5W 500 MG/100ML IV SOLN
500.0000 mg | INTRAVENOUS | Status: AC
  Administered 2020-06-14 – 2020-06-18 (×3): 500 mg via INTRAVENOUS
  Filled 2020-06-12 (×3): qty 100

## 2020-06-12 MED ORDER — FUROSEMIDE 10 MG/ML IJ SOLN
60.0000 mg | Freq: Once | INTRAMUSCULAR | Status: AC
  Administered 2020-06-12: 60 mg via INTRAVENOUS
  Filled 2020-06-12: qty 6

## 2020-06-12 MED ORDER — HYDRALAZINE HCL 20 MG/ML IJ SOLN
10.0000 mg | Freq: Once | INTRAMUSCULAR | Status: AC
  Administered 2020-06-12: 10 mg via INTRAVENOUS

## 2020-06-12 NOTE — Progress Notes (Signed)
[place], [place] 06/12/20  Subjective:   Hospital day # 8 Remains sedated with fentanyl and Versed No acute events Urine output seems to be improving.  Total urine output of 1300 last 24 hours Nurse manipulated the Foley, that seem to have helped FiO2 45% Tube feeds going at 60 cc/h   Renal: 06/07 0701 - 06/08 0700 In: 2585.9 [I.V.:1111.1; NG/GT:570; IV Piggyback:874.9] Out: 0998 [Urine:1320; Emesis/NG output:150; Stool:250] Lab Results  Component Value Date   CREATININE 6.02 (H) 06/12/2020   CREATININE 4.98 (H) 06/11/2020   CREATININE 5.47 (H) 06/10/2020     Objective:  Vital signs in last 24 hours:  Temp:  [96.8 F (36 C)-100.5 F (38.1 C)] 98.6 F (37 C) (06/08 0900) Pulse Rate:  [93-120] 103 (06/08 0900) Resp:  [13-22] 16 (06/08 0900) BP: (148-185)/(57-74) 160/63 (06/08 0900) SpO2:  [90 %-98 %] 95 % (06/08 0900) FiO2 (%):  [35 %-45 %] 45 % (06/08 0747) Weight:  [99.4 kg] 99.4 kg (06/08 0429)  Weight change: 0.8 kg Filed Weights   06/10/20 0448 06/11/20 0453 06/12/20 0429  Weight: 95.5 kg 98.6 kg 99.4 kg    Intake/Output:    Intake/Output Summary (Last 24 hours) at 06/12/2020 1020 Last data filed at 06/12/2020 3382 Gross per 24 hour  Intake 2707.39 ml  Output 1805 ml  Net 902.39 ml    Physical Exam:  General: No acute distress, laying in the bed, critically ill-appearing  HEENT anicteric,  ETT, OG tube  Pulm/lungs coarse breath sounds, ventilator assisted  CVS/Heart regular rhythm, tachycardic  Abdomen:  Soft, nontender  Extremities: No peripheral edema  Neurologic: Sedated  Skin:  Warm, dry  Coud catheter in place  Rectal tube in place   Basic Metabolic Panel:  Recent Labs  Lab 06/08/20 0534 06/08/20 1729 06/09/20 0511 06/10/20 0450 06/11/20 0449 06/12/20 0435  NA 142  --  142 141 142 142  K 3.3*  --  3.2* 3.3* 3.8 3.6  CL 112*  --  112* 111 108 111  CO2 22  --  19* 18* 22 22  GLUCOSE 199*  --  174*  154* 255* 214*  BUN 42*  --  61* 81* 86* 112*  CREATININE 2.16*  --  3.82* 5.47* 4.98* 6.02*  CALCIUM 8.3*  --  8.3* 8.0* 8.2* 8.0*  MG 2.4 2.5* 2.5* 2.6* 2.6*  --   PHOS 4.0 4.3 5.9* 7.7* 7.5*  --      CBC: Recent Labs  Lab 06/08/20 0534 06/09/20 0511 06/10/20 0450 06/11/20 0449 06/12/20 0435  WBC 7.0 5.8 6.8 10.1 7.5  NEUTROABS  --   --   --  9.1* 5.7  HGB 11.8* 11.4* 10.5* 10.1* 9.6*  HCT 35.6* 34.1* 31.7* 29.4* 28.8*  MCV 90.4 88.6 89.5 85.7 87.8  PLT 128* 122* 134* 167 205      Lab Results  Component Value Date   HEPBSAG NON REACTIVE 06/09/2020   HEPBSAB NON REACTIVE 06/09/2020      Microbiology:  Recent Results (from the past 240 hour(s))  Blood culture (single)     Status: Abnormal   Collection Time: 06/04/20  5:18 PM   Specimen: BLOOD  Result Value Ref Range Status   Specimen Description   Final    BLOOD BLOOD RIGHT HAND Performed at [facility], [address] [place], [place] [address]    Special Requests   Final    BOTTLES DRAWN AEROBIC AND ANAEROBIC Blood Culture adequate volume Performed at [facility], [address] [place], [place] [address]    Culture  Setup Time   Final    GRAM POSITIVE COCCI IN BOTH AEROBIC AND ANAEROBIC BOTTLES CRITICAL RESULT CALLED TO, READ BACK BY AND VERIFIED WITH: Elvera Maria 0175 06/06/2020 DLB Performed at [facility], [address] [place], [place] [address]    Culture (A)  Final    STAPHYLOCOCCUS EPIDERMIDIS THE SIGNIFICANCE OF ISOLATING THIS ORGANISM FROM A SINGLE SET OF BLOOD CULTURES WHEN MULTIPLE SETS ARE DRAWN IS UNCERTAIN. PLEASE NOTIFY THE MICROBIOLOGY DEPARTMENT WITHIN ONE WEEK IF SPECIATION AND SENSITIVITIES ARE REQUIRED. MICROCOCCUS SPECIES Standardized susceptibility testing for this organism is not available. KOCURIA RHIZOPHILIA Performed at [facility], [place] [address] [place], [place] [address]    Report Status 06/10/2020 FINAL  Final  Resp Panel by  RT-PCR (Flu A&B, Covid) Nasopharyngeal Swab     Status: None   Collection Time: 06/04/20  5:18 PM   Specimen: Nasopharyngeal Swab; Nasopharyngeal(NP) swabs in vial transport medium  Result Value Ref Range Status   SARS Coronavirus 2 by RT PCR NEGATIVE NEGATIVE Final    Comment: (NOTE) SARS-CoV-2 target nucleic acids are NOT DETECTED.  The SARS-CoV-2 RNA is generally detectable in upper respiratory specimens during the acute phase of infection. The lowest concentration of SARS-CoV-2 viral copies this assay can detect is 138 copies/mL. A negative result does not preclude SARS-Cov-2 infection and should not be used as the sole basis for treatment or other patient management decisions. A negative result may occur with  improper specimen collection/handling, submission of specimen other than nasopharyngeal swab, presence of viral mutation(s) within the areas targeted by this assay, and inadequate number of viral copies(<138 copies/mL). A negative result must be combined with clinical observations, patient history, and epidemiological information. The expected result is Negative.  Fact Sheet for Patients:  EntrepreneurPulse.com.au  Fact Sheet for Healthcare Providers:  IncredibleEmployment.be  This test is no t yet approved or cleared by the [place] FDA and  has been authorized for detection and/or diagnosis of SARS-CoV-2 by FDA under an Emergency Use Authorization (EUA). This EUA will remain  in effect (meaning this test can be used) for the duration of the COVID-19 declaration under Section 564(b)(1) of the Act, 21 U.S.C.section 360bbb-3(b)(1), unless the authorization is terminated  or revoked sooner.       Influenza A by PCR NEGATIVE NEGATIVE Final   Influenza B by PCR NEGATIVE NEGATIVE Final    Comment: (NOTE) The Xpert Xpress SARS-CoV-2/FLU/RSV plus assay is intended as an aid in the diagnosis of influenza from Nasopharyngeal swab  specimens and should not be used as a sole basis for treatment. Nasal washings and aspirates are unacceptable for Xpert Xpress SARS-CoV-2/FLU/RSV testing.  Fact Sheet for Patients: EntrepreneurPulse.com.au  Fact Sheet for Healthcare Providers: IncredibleEmployment.be  This test is not yet approved or cleared by the [place] FDA and has been authorized for detection and/or diagnosis of SARS-CoV-2 by FDA under an Emergency Use Authorization (EUA). This EUA will remain in effect (meaning this test can be used) for the duration of the COVID-19 declaration under Section 564(b)(1) of the Act, 21 U.S.C. section 360bbb-3(b)(1), unless the authorization is terminated or revoked.  Performed at [facility], [address] [place],  [address]   Blood Culture ID Panel (Reflexed)     Status: Abnormal   Collection Time: 06/04/20  5:18 PM  Result Value Ref Range Status   Enterococcus faecalis NOT DETECTED NOT DETECTED Final  Enterococcus Faecium NOT DETECTED NOT DETECTED Final   Listeria monocytogenes NOT DETECTED NOT DETECTED Final   Staphylococcus species DETECTED (A) NOT DETECTED Final    Comment: CRITICAL RESULT CALLED TO, READ BACK BY AND VERIFIED WITH: [facility] MITCHELL 1610 06/06/2020 DLB    Staphylococcus aureus (BCID) NOT DETECTED NOT DETECTED Final   Staphylococcus epidermidis DETECTED (A) NOT DETECTED Final    Comment: CRITICAL RESULT CALLED TO, READ BACK BY AND VERIFIED WITH: Elvera Maria [id number] 06/06/2020 DLB    Staphylococcus lugdunensis NOT DETECTED NOT DETECTED Final   Streptococcus species NOT DETECTED NOT DETECTED Final   Streptococcus agalactiae NOT DETECTED NOT DETECTED Final   Streptococcus pneumoniae NOT DETECTED NOT DETECTED Final   Streptococcus pyogenes NOT DETECTED NOT DETECTED Final   A.calcoaceticus-baumannii NOT DETECTED NOT DETECTED Final   Bacteroides fragilis NOT DETECTED NOT DETECTED Final    Enterobacterales NOT DETECTED NOT DETECTED Final   Enterobacter cloacae complex NOT DETECTED NOT DETECTED Final   Escherichia coli NOT DETECTED NOT DETECTED Final   Klebsiella aerogenes NOT DETECTED NOT DETECTED Final   Klebsiella oxytoca NOT DETECTED NOT DETECTED Final   Klebsiella pneumoniae NOT DETECTED NOT DETECTED Final   Proteus species NOT DETECTED NOT DETECTED Final   Salmonella species NOT DETECTED NOT DETECTED Final   Serratia marcescens NOT DETECTED NOT DETECTED Final   Haemophilus influenzae NOT DETECTED NOT DETECTED Final   Neisseria meningitidis NOT DETECTED NOT DETECTED Final   Pseudomonas aeruginosa NOT DETECTED NOT DETECTED Final   Stenotrophomonas maltophilia NOT DETECTED NOT DETECTED Final   Candida albicans NOT DETECTED NOT DETECTED Final   Candida auris NOT DETECTED NOT DETECTED Final   Candida glabrata NOT DETECTED NOT DETECTED Final   Candida krusei NOT DETECTED NOT DETECTED Final   Candida parapsilosis NOT DETECTED NOT DETECTED Final   Candida tropicalis NOT DETECTED NOT DETECTED Final   Cryptococcus neoformans/gattii NOT DETECTED NOT DETECTED Final   Methicillin resistance mecA/C NOT DETECTED NOT DETECTED Final    Comment: Performed at [facility], [address] [place], [place] [address]  Blood culture (single)     Status: None   Collection Time: 06/04/20  9:48 PM   Specimen: BLOOD  Result Value Ref Range Status   Specimen Description BLOOD BLOOD RIGHT ARM  Final   Special Requests   Final    BOTTLES DRAWN AEROBIC AND ANAEROBIC Blood Culture adequate volume   Culture   Final    NO GROWTH 5 DAYS Performed at [facility], [address], [place], [place] [address]    Report Status 06/09/2020 FINAL  Final  Urine Culture     Status: None   Collection Time: 06/06/20  2:37 PM   Specimen: Urine, Random  Result Value Ref Range Status   Specimen Description   Final    URINE, RANDOM Performed at [facility], [address] [place], [place] [address]    Special Requests   Final    NONE Performed at [facility], [address], [place], [place] [address]    Culture   Final    NO GROWTH Performed at [facility], [address] [place], [place] [address]    Report Status 06/08/2020 FINAL  Final  MRSA PCR Screening     Status: None   Collection Time: 06/06/20  2:40 PM   Specimen: Nasopharyngeal  Result Value Ref Range Status   MRSA by PCR NEGATIVE NEGATIVE Final    Comment:  The GeneXpert MRSA Assay (FDA approved for NASAL specimens only), is one component of a comprehensive MRSA colonization surveillance program. It is not intended to diagnose MRSA infection nor to guide or monitor treatment for MRSA infections. Performed at [facility], [place], [place] [address]   Acid Fast Smear (AFB)     Status: None   Collection Time: 06/06/20  6:30 PM   Specimen: Bronchoalveolar Lavage; Sputum  Result Value Ref Range Status   AFB Specimen Processing Concentration  Final   Acid Fast Smear Negative  Final    Comment: (NOTE) Performed At: [facility] [place], [place] [id number] Rush Farmer MD [id number]    Source (AFB) BRONCHIAL ALVEOLAR LAVAGE  Corrected    Comment: Performed at [facility], [address] [place], [place] [address] CORRECTED ON 06/02 AT 2239: PREVIOUSLY REPORTED AS SPUTUM   Culture, Respiratory w Gram Stain     Status: None   Collection Time: 06/06/20  6:30 PM   Specimen: Bronchoalveolar Lavage  Result Value Ref Range Status   Specimen Description   Final    BRONCHIAL ALVEOLAR LAVAGE Performed at [facility], [address] [place], [place] [address]    Special Requests   Final    NONE Performed at [facility], [address] [place], [place] [address]    Gram Stain   Final    FEW WBC PRESENT,BOTH PMN AND MONONUCLEAR NO ORGANISMS SEEN    Culture   Final    NO  GROWTH 3 DAYS Performed at [facility], [place] [address], [place], [place] [address]    Report Status 06/09/2020 FINAL  Final  Fungus Culture With Stain     Status: None (Preliminary result)   Collection Time: 06/06/20  6:30 PM  Result Value Ref Range Status   Fungus Stain Final report  Final    Comment: (NOTE) Performed At: [facility] [place], [place] [id number] Rush Farmer MD [id number]    Fungus (Mycology) Culture PENDING  Incomplete   Fungal Source BRONCHIAL ALVEOLAR LAVAGE  Final    Comment: Performed at [facility], [place] [address], [place], [place] [address]  Fungus Culture Result     Status: None   Collection Time: 06/06/20  6:30 PM  Result Value Ref Range Status   Result 1 Comment  Final    Comment: (NOTE) KOH/Calcofluor preparation:  no fungus observed. Performed At: [facility] [place], [place] [id number] Rush Farmer MD [id number]   Respiratory (~20 pathogens) panel by PCR     Status: None   Collection Time: 06/07/20  5:50 AM   Specimen: Nasopharyngeal Swab; Respiratory  Result Value Ref Range Status   Adenovirus NOT DETECTED NOT DETECTED Final   Coronavirus 229E NOT DETECTED NOT DETECTED Final    Comment: (NOTE) The Coronavirus on the Respiratory Panel, DOES NOT test for the novel  Coronavirus (2019 nCoV)    Coronavirus HKU1 NOT DETECTED NOT DETECTED Final   Coronavirus NL63 NOT DETECTED NOT DETECTED Final   Coronavirus OC43 NOT DETECTED NOT DETECTED Final   Metapneumovirus NOT DETECTED NOT DETECTED Final   Rhinovirus / Enterovirus NOT DETECTED NOT DETECTED Final   Influenza A NOT DETECTED NOT DETECTED Final   Influenza B NOT DETECTED NOT DETECTED Final   Parainfluenza Virus 1 NOT DETECTED NOT DETECTED Final   Parainfluenza Virus 2 NOT DETECTED NOT DETECTED Final   Parainfluenza Virus 3 NOT DETECTED  NOT DETECTED Final   Parainfluenza Virus 4 NOT DETECTED NOT DETECTED Final   Respiratory  Syncytial Virus NOT DETECTED NOT DETECTED Final   Bordetella pertussis NOT DETECTED NOT DETECTED Final   Bordetella Parapertussis NOT DETECTED NOT DETECTED Final   Chlamydophila pneumoniae NOT DETECTED NOT DETECTED Final   Mycoplasma pneumoniae NOT DETECTED NOT DETECTED Final    Comment: Performed at [facility], [place] [address], [place], [place] [address]    Coagulation Studies: No results for input(s): LABPROT, INR in the last 72 hours.  Urinalysis: No results for input(s): COLORURINE, LABSPEC, PHURINE, GLUCOSEU, HGBUR, BILIRUBINUR, KETONESUR, PROTEINUR, UROBILINOGEN, NITRITE, LEUKOCYTESUR in the last 72 hours.  Invalid input(s): APPERANCEUR    Imaging: DG Chest Port 1 View  Result Date: 06/10/2020 CLINICAL DATA:  Central line placement. EXAM: PORTABLE CHEST 1 VIEW COMPARISON:  06/07/2020 FINDINGS: The endotracheal tube is 4 cm above the carina. The NG tube is coursing down the esophagus and into the stomach. Right PICC line tip is in the mid SVC. Left IJ central venous catheter tip is in the SVC just below the brachiocephalic SVC junction. Persistent and progressive opacification of the left hemithorax. IMPRESSION: 1. Support apparatus in good position without complicating features. 2. Persistent and progressive opacification of the left hemithorax. Electronically Signed   By: Marijo Sanes M.D.   On: 06/10/2020 12:02     Medications:   . sodium chloride 5 mL/hr at 06/12/20 0800  . doxycycline (VIBRAMYCIN) IV Stopped (06/11/20 2310)  . feeding supplement (VITAL HIGH PROTEIN) 1,000 mL (06/11/20 1412)  . fentaNYL infusion INTRAVENOUS 350 mcg/hr (06/12/20 0800)  . levofloxacin (LEVAQUIN) IV    . midazolam 7.5 mg/hr (06/12/20 0800)   . budesonide (PULMICORT) nebulizer solution  0.5 mg Nebulization BID  . chlorhexidine gluconate (MEDLINE KIT)  15 mL Mouth Rinse BID  . Chlorhexidine Gluconate Cloth  6 each Topical Daily  . feeding supplement (PROSource TF)  45 mL Per Tube Daily   . free water  100 mL Per Tube Q4H  . heparin injection (subcutaneous)  5,000 Units Subcutaneous Q8H  . insulin aspart  0-20 Units Subcutaneous Q4H  . insulin glargine  10 Units Subcutaneous Daily  . ipratropium-albuterol  3 mL Nebulization Q4H  . mouth rinse  15 mL Mouth Rinse 10 times per day  . methylPREDNISolone (SOLU-MEDROL) injection  20 mg Intravenous Daily  . multivitamin  1 tablet Per Tube QHS  . pantoprazole (PROTONIX) IV  40 mg Intravenous Q24H  . prazosin  4 mg Per Tube QHS  . sodium bicarbonate  1,300 mg Per Tube BID  . sodium chloride flush  10-40 mL Intracatheter Q12H  . traZODone  150 mg Per Tube QHS   sodium chloride, fentaNYL, heparin, labetalol, midazolam, ondansetron **OR** ondansetron (ZOFRAN) IV, sodium chloride flush  Assessment/ Plan:  60 y.o. male with  medical problems of   Hypertension, PTSD, diabetes, migraines, GERD, neurogenic bladder secondary to remote history of transverse myelitis, requires self-catheterization and has frequent UTIs, opioids for chronic pain  admitted on 06/04/2020 for Sepsis ([place]) [A41.9] Altered mental status, unspecified altered mental status type [R41.82] Community acquired pneumonia, unspecified laterality [J18.9]   #Acute kidney injury, proteinuria, hematuria Baseline creatinine of 1.26 at admission Increasing creatinine trends since June 3. AKI is likely multifactorial secondary to ongoing sepsis as well as IV contrast exposure on June 3 Urinalysis at admission showed glucosuria, greater than 300 proteinuria, 6-10 RBCs, moderate hemoglobin noted on dipstick, 11-20 WBCs Renal imaging as noted above.  Patient had CT abdomen pelvis without contrast.  No hydronephrosis.  Bladder was unremarkable. UPC 2.55 gm RBC 21-50 cc June 09, 2020: ANA negative, ESR normal, complement C3-C4 normal, kappa lambda ratio normal, hepatitis B and hepatitis C studies negative, HIV negative, crypto antigen negative Hemodialysis started on June  6  Plan: Patient tolerated his dialysis treatment well on 6/6.  No volume was removed Electrolytes and volume status are acceptable today.  No acute indication for dialysis at present.  We will continue to monitor on a daily basis for need of further dialysis Continue to monitor urine output as well as electrolytes Trial of 1 dose of IV Lasix  #Acute respiratory failure Currently requiring ventilator support FiO2 45%,      LOS: [place] 6/8/202210:20 AM  [place], [place]  Note: This note was prepared with Dragon dictation. Any transcription errors are unintentional

## 2020-06-12 NOTE — Consult Note (Signed)
PHARMACY CONSULT NOTE  Pharmacy Consult for Electrolyte Monitoring and Replacement   Recent Labs: Potassium (mmol/L)  Date Value  06/12/2020 3.6   Magnesium (mg/dL)  Date Value  93/57/0177 2.6 (H)   Calcium (mg/dL)  Date Value  93/90/3009 8.0 (L)   Albumin (g/dL)  Date Value  23/30/0762 2.4 (L)   Phosphorus (mg/dL)  Date Value  26/33/3545 7.5 (H)   Sodium (mmol/L)  Date Value  06/12/2020 142   Assessment: Patient is a 60 y/o M with medical history including diabetes, migraines, GERD, neurogenic bladder, HTN, chronic opioid use who is admitted with sepsis secondary to pneumonia. Patient is admitted to the ICU and is currently intubated, sedated, and on mechanical ventilation.   Pharmacy consulted to assist with electrolyte monitoring and replacement as indicated. Patient received first HD treatment 6/6. Planning to assess daily for HD needs. Furosemide 60 mg IV x 1 given 6/8.  Nutrition: tube feeds + free water flushes 100 mL q4h  Goal of Therapy:  Electrolytes within normal limits  Plan:  --No replacement indicated today --Monitor electrolytes with AM labs  Pricilla Riffle, PharmD 06/12/2020 11:27 AM

## 2020-06-12 NOTE — Plan of Care (Signed)

## 2020-06-12 NOTE — Plan of Care (Signed)
Neuro: stable on sedation Resp: Stable on ventilator settings, secretions increasing, could benefit from regular chest PT CV: TMAX 38.1, initially ice applied to groin and axillary, temp decreased appropriately yet still warm to the touch and diaphoretic, patient given bath and left the the gown on lower half-skin redness resolving, HR decreasing, patient appears more comfortable; Hypertensive throughout the afternoon, Labetalol and sedation PRNs given with short term resolution, Dr. Belia Heman notified, 10mg  Hydralazine given and awaiting results GIGU: tolerating tube feeds well through OG, foley in place-significant scrotal swelling-elevated, flexiseal in place with liquid/soft BM, no emesis Skin: scattered bruising, red and mottled- resolving with environmental changes Social: Brother called for update this afternoon, all questions and concerns addressed.   Problem: Education: Goal: Knowledge of General Education information will improve Description: Including pain rating scale, medication(s)/side effects and non-pharmacologic comfort measures Outcome: Progressing   Problem: Health Behavior/Discharge Planning: Goal: Ability to manage health-related needs will improve Outcome: Progressing   Problem: Clinical Measurements: Goal: Ability to maintain clinical measurements within normal limits will improve Outcome: Progressing Goal: Will remain free from infection Outcome: Progressing Goal: Diagnostic test results will improve Outcome: Progressing Goal: Respiratory complications will improve Outcome: Progressing Goal: Cardiovascular complication will be avoided Outcome: Progressing   Problem: Activity: Goal: Risk for activity intolerance will decrease Outcome: Progressing   Problem: Nutrition: Goal: Adequate nutrition will be maintained Outcome: Progressing   Problem: Coping: Goal: Level of anxiety will decrease Outcome: Progressing   Problem: Elimination: Goal: Will not experience  complications related to bowel motility Outcome: Progressing Goal: Will not experience complications related to urinary retention Outcome: Progressing   Problem: Pain Managment: Goal: General experience of comfort will improve Outcome: Progressing   Problem: Safety: Goal: Ability to remain free from injury will improve Outcome: Progressing   Problem: Skin Integrity: Goal: Risk for impaired skin integrity will decrease Outcome: Progressing

## 2020-06-12 NOTE — Progress Notes (Signed)
Date of Admission:  06/04/2020     ID: Delrae Deeric Wakefield Brooke Bonito. is a 60 y.o. male Principal Problem:   CAP (community acquired pneumonia) Active Problems:   Acute metabolic encephalopathy   Sepsis ([place])   Neurogenic bladder   Chronic, continuous use of opioids   Chronic low back pain   Type 2 diabetes mellitus without complication (HCC)   Migraines   Recurrent UTI   History of colon polyps   Urinary retention   Altered mental status    Subjective: Remains intubated  Medications:  . budesonide (PULMICORT) nebulizer solution  0.5 mg Nebulization BID  . chlorhexidine gluconate (MEDLINE KIT)  15 mL Mouth Rinse BID  . Chlorhexidine Gluconate Cloth  6 each Topical Daily  . diazepam  5 mg Per Tube Q6H  . feeding supplement (PROSource TF)  45 mL Per Tube Daily  . free water  100 mL Per Tube Q4H  . heparin injection (subcutaneous)  5,000 Units Subcutaneous Q8H  . insulin aspart  0-20 Units Subcutaneous Q4H  . insulin glargine  10 Units Subcutaneous Daily  . ipratropium-albuterol  3 mL Nebulization Q4H  . mouth rinse  15 mL Mouth Rinse 10 times per day  . methylPREDNISolone (SOLU-MEDROL) injection  20 mg Intravenous Daily  . multivitamin  1 tablet Per Tube QHS  . oxyCODONE  10 mg Per Tube Q6H  . pantoprazole (PROTONIX) IV  40 mg Intravenous Q24H  . prazosin  4 mg Per Tube QHS  . sodium bicarbonate  1,300 mg Per Tube BID  . sodium chloride flush  10-40 mL Intracatheter Q12H  . traZODone  150 mg Per Tube QHS    Objective: Vital signs in last 24 hours: Temp:  [96.8 F (36 C)-100.5 F (38.1 C)] 100.22 F (37.9 C) (06/08 1200) Pulse Rate:  [93-120] 113 (06/08 1200) Resp:  [13-22] 22 (06/08 1200) BP: (148-185)/(57-74) 172/67 (06/08 1200) SpO2:  [92 %-98 %] 94 % (06/08 1200) FiO2 (%):  [45 %] 45 % (06/08 0747) Weight:  [99.4 kg] 99.4 kg (06/08 0429)  PHYSICAL EXAM:  General: Alert, cooperative, no distress, appears stated age.  Head: Normocephalic, without obvious  abnormality, atraumatic. Eyes: Conjunctivae clear, anicteric sclerae. Pupils are equal ENT Nares normal. No drainage or sinus tenderness. Lips, mucosa, and tongue normal. No Thrush Neck: Supple, symmetrical, no adenopathy, thyroid: non tender no carotid bruit and no JVD. Back: No CVA tenderness. Lungs: Clear to auscultation bilaterally. No Wheezing or Rhonchi. No rales. Heart: Regular rate and rhythm, no murmur, rub or gallop. Abdomen: Soft, non-tender,not distended. Bowel sounds normal. No masses Extremities: atraumatic, no cyanosis. No edema. No clubbing Skin: No rashes or lesions. Or bruising Lymph: Cervical, supraclavicular normal. Neurologic: Grossly non-focal  Lab Results Recent Labs    06/11/20 0449 06/12/20 0435  WBC 10.1 7.5  HGB 10.1* 9.6*  HCT 29.4* 28.8*  NA 142 142  K 3.8 3.6  CL 108 111  CO2 22 22  BUN 86* 112*  CREATININE 4.98* 6.02*   Liver Panel Recent Labs    06/10/20 0450 06/11/20 0449  ALBUMIN 1.6* 2.4*   Sedimentation Rate No results for input(s): ESRSEDRATE in the last 72 hours. C-Reactive Protein No results for input(s): CRP in the last 72 hours.  Microbiology:  Studies/Results: No results found.   Assessment/Plan: Acute hypoxic respiratory failure due to left sided pneumonia  On  levaquin and Doxy (for possible TBI)  Encephalopathy- MRI no focal lesion  AKI- combination of sepsis and contrast Got HD yesterday  Smoker  Neurogenic bladder  H/o transverse myelitis  Blotchy rash over knees /chest- not typical of drug rash. Better today  Discussed the management with care team

## 2020-06-12 NOTE — Progress Notes (Signed)
NAME:  Wyatt Lewinski., MRN:  [id number], DOB:  [birth year], LOS: 8 ADMISSION DATE:  06/04/2020  60 y.o.malewith medical history significant forHTN, PTSD, diabetes,migraines,GERD,neurogenic bladder secondary to remote history of transverse myelitis,  who self catheterizes and has frequent UTIsas well aschronic back pain on chronic opiates who at baselineis independent -brought to the ER with a 3-day history of altered mental status.  +cough,shortness of breathand feveras well asvomiting and diarrhea and started becoming increasingly confused in the 24 hours prior to arrival.   Unable to get any meaningful history from patient.  ED course: On arrival, temperature 99.3, BP 167/67, pulse 99, respirations 18 with O2 sat 96% on room air. Blood work significant for leukocytosis of 13,600 with lactic acid of 1.2. Ammonia level normal at 15, EtOH less than 10. Sodium 130, potassium 3.2, glucose 249 creatinine 1.06. Urinalysis with 80 ketones and rare bacteria. UDS positive for opiates and tricyclic's. COVID and flu negative    Imaging:   CT chest abdomen and pelvis without contrast consistent with pneumonia. Multiple nodules left upper lobe likely infectious or inflammatory with follow-up imaging recommended to document resolution. Cardiomegaly with trace pericardial effusion  Patient was started on sepsis fluids and broad-spectrum antibiotics initially for sepsis of unknown source.   Significant Hospital Events: Including procedures, antibiotic start and stop dates in addition to other pertinent events   5/31 admitted for sepsis present on admission for Left lung pneumonia  6/1 progressive confusion and hypoxia  6/2 transferred to [facility]  6/3 high fevers, remains ill, SELF EXTUBATED  6/3 emergently re-intubated  6/5 failure to wean from v ent  6/6 failure to wean from vent, VASC CATH PLACED  6/7 HD started remains  critically ill  6/8 remains on vent    Antibiotics Given (last 72 hours)    Date/Time Action Medication Dose Rate   06/09/20 1320 New Bag/Given   meropenem (MERREM) 500 mg in sodium chloride 0.9 % 100 mL IVPB 500 mg 200 mL/hr   06/09/20 2143 New Bag/Given   azithromycin (ZITHROMAX) 500 mg in sodium chloride 0.9 % 250 mL IVPB 500 mg 250 mL/hr   06/10/20 0031 New Bag/Given   meropenem (MERREM) 500 mg in sodium chloride 0.9 % 100 mL IVPB 500 mg 200 mL/hr   06/10/20 1310 New Bag/Given   doxycycline (VIBRAMYCIN) 100 mg in sodium chloride 0.9 % 250 mL IVPB 100 mg 125 mL/hr   06/10/20 1317 New Bag/Given   meropenem (MERREM) 500 mg in sodium chloride 0.9 % 100 mL IVPB 500 mg 200 mL/hr   06/10/20 2203 New Bag/Given   azithromycin (ZITHROMAX) 500 mg in sodium chloride 0.9 % 250 mL IVPB 500 mg 250 mL/hr   06/11/20 0005 New Bag/Given   doxycycline (VIBRAMYCIN) 100 mg in sodium chloride 0.9 % 250 mL IVPB 100 mg 125 mL/hr   06/11/20 0103 New Bag/Given   meropenem (MERREM) 500 mg in sodium chloride 0.9 % 100 mL IVPB 500 mg 200 mL/hr   06/11/20 0919 New Bag/Given   doxycycline (VIBRAMYCIN) 100 mg in sodium chloride 0.9 % 250 mL IVPB 100 mg 125 mL/hr   06/11/20 1715 New Bag/Given   meropenem (MERREM) 500 mg in sodium chloride 0.9 % 100 mL IVPB 500 mg 200 mL/hr   06/11/20 2109 New Bag/Given   doxycycline (VIBRAMYCIN) 100 mg in sodium chloride 0.9 % 250 mL IVPB 100 mg 125 mL/hr   06/11/20 2111 New Bag/Given   azithromycin (ZITHROMAX) 500 mg in sodium chloride 0.9 %  250 mL IVPB 500 mg 250 mL/hr      Interim History / Subjective:  Remains on vent critically ill Multiorgan failure ECHO diastolic dysfunction Consider SAT/SBT when family arrives         Objective   Blood pressure (!) 160/69, pulse 100, temperature 97.88 F (36.6 C), resp. rate 15, height $RemoveBe'5\' 6"'WxAzbDwUk$  (1.676 m), weight 99.4 kg, SpO2 96 %.    Vent Mode: PRVC FiO2 (%):  [35 %-45 %] 45 % Set Rate:  [14 bmp-16 bmp] 14 bmp Vt  Set:  [520 mL] 520 mL PEEP:  [5 cmH20] 5 cmH20   Intake/Output Summary (Last 24 hours) at 06/12/2020 0739 Last data filed at 06/12/2020 0600 Gross per 24 hour  Intake 2585.92 ml  Output 1720 ml  Net 865.92 ml   Filed Weights   06/10/20 0448 06/11/20 0453 06/12/20 0429  Weight: 95.5 kg 98.6 kg 99.4 kg      REVIEW OF SYSTEMS  PATIENT IS UNABLE TO PROVIDE COMPLETE REVIEW OF SYSTEMS DUE TO SEVERE CRITICAL ILLNESS AND TOXIC METABOLIC ENCEPHALOPATHY   PHYSICAL EXAMINATION:  GENERAL:critically ill appearing, +resp distress HEAD: Normocephalic, atraumatic.  EYES: Pupils equal, round, reactive to light.  No scleral icterus.  MOUTH: Moist mucosal membrane. NECK: Supple. PULMONARY: +rhonchi, +wheezing CARDIOVASCULAR: S1 and S2. Regular rate and rhythm. No murmurs, rubs, or gallops.  GASTROINTESTINAL: Soft, nontender, -distended. Positive bowel sounds.  MUSCULOSKELETAL: No swelling, clubbing, or edema.  NEUROLOGIC: obtunded SKIN:intact,warm,dry   Labs/imaging that I havepersonally reviewed  (right click and "Reselect all SmartList Selections" daily)        ASSESSMENT AND PLAN SYNOPSIS    60 yo white make with acute and severe hypoxic respiratory failure with acute and severe toxic metabolic encephalopathy with acute COPD ExacerbationLUL/LL multifocal pneumonia Self extubated and re intubated emergently, now with progressive renal failure    Severe ACUTE Hypoxic and Hypercapnic Respiratory Failure -continue Mechanical Ventilator support -continue Bronchodilator Therapy -Wean Fio2 and PEEP as tolerated -VAP/VENT bundle implementation -will perform SAT/SBT when respiratory parameters are met and when family is present   SEVERE COPD EXACERBATION -continue IV steroids as prescribed -continue NEB THERAPY as prescribed     CARDIAC FAILURE-acute siastolic dysfunction -oxygen as needed -Lasix as tolerated -follow up cardiac enzymes as indicated   CARDIAC ICU  monitoring   ACUTE KIDNEY INJURY/Renal Failure -continue Foley Catheter-assess need -Avoid nephrotoxic agents -Follow urine output, BMP -Ensure adequate renal perfusion, optimize oxygenation -Renal dose medications HD as needed    NEUROLOGY Acute toxic metabolic encephalopathy, need for sedation Goal RASS -2 to -3   SEPTIC SHOCK -use vasopressors to keep MAP>65 as needed -follow ABG and LA -follow up cultures -emperic ABX   INFECTIOUS DISEASE -continue antibiotics as prescribed -follow up cultures -follow up ID consultation  MRSA NEG RVP NEGATIVE COVID NEG INF AB NEG SPUTUM CX NG AFB SMEAR NEG HIV NEG   ENDO - ICU hypoglycemic\Hyperglycemia protocol -check FSBS per protocol   GI GI PROPHYLAXIS as indicated  NUTRITIONAL STATUS DIET-->TF's as tolerated Constipation protocol as indicated   ELECTROLYTES -follow labs as needed -replace as needed -pharmacy consultation and following     Best practice (right click and "Reselect all SmartList Selections" daily)  Diet:NPO Pain/Anxiety/Delirium protocol (if indicated):Yes(RASS goal -2) VAP protocol (if indicated):Yes DVT prophylaxis:Subcutaneous Heparin GI prophylaxis:H2B Glucose control:SSIYes Central venous access:N/A Arterial line:N/A Foley:N/A Mobility:bed rest PT consulted:N/A Code Status:full code Disposition:ICU     Labs   CBC: Recent Labs  Lab 06/08/20 0534 06/09/20 0511 06/10/20 0450  06/11/20 0449 06/12/20 0435  WBC 7.0 5.8 6.8 10.1 7.5  NEUTROABS  --   --   --  9.1* 5.7  HGB 11.8* 11.4* 10.5* 10.1* 9.6*  HCT 35.6* 34.1* 31.7* 29.4* 28.8*  MCV 90.4 88.6 89.5 85.7 87.8  PLT 128* 122* 134* 167 353    Basic Metabolic Panel: Recent Labs  Lab 06/08/20 0534 06/08/20 1729 06/09/20 0511 06/10/20 0450 06/11/20 0449 06/12/20 0435  NA 142  --  142 141 142 142  K 3.3*  --  3.2* 3.3* 3.8 3.6  CL 112*  --  112* 111 108 111  CO2 22  --  19* 18* 22 22   GLUCOSE 199*  --  174* 154* 255* 214*  BUN 42*  --  61* 81* 86* 112*  CREATININE 2.16*  --  3.82* 5.47* 4.98* 6.02*  CALCIUM 8.3*  --  8.3* 8.0* 8.2* 8.0*  MG 2.4 2.5* 2.5* 2.6* 2.6*  --   PHOS 4.0 4.3 5.9* 7.7* 7.5*  --    GFR: Estimated Creatinine Clearance: 14.2 mL/min (A) (by C-G formula based on SCr of 6.02 mg/dL (H)). Recent Labs  Lab 06/05/20 0838 06/06/20 1918 06/09/20 0511 06/10/20 0450 06/11/20 0449 06/12/20 0435  PROCALCITON 4.33  --   --   --   --   --   WBC  --    < > 5.8 6.8 10.1 7.5  LATICACIDVEN 1.5  --   --   --   --   --    < > = values in this interval not displayed.    Liver Function Tests: Recent Labs  Lab 06/06/20 1918 06/09/20 0511 06/10/20 0450 06/11/20 0449  AST 191* 80*  --   --   ALT 110* 68*  --   --   ALKPHOS 68 93  --   --   BILITOT 1.4* 0.9  --   --   PROT 6.7 4.8*  --   --   ALBUMIN 2.9* 1.7* 1.6* 2.4*   No results for input(s): LIPASE, AMYLASE in the last 168 hours. No results for input(s): AMMONIA in the last 168 hours.  ABG    Component Value Date/Time   PHART 7.33 (L) 06/08/2020 0500   PCO2ART 40 06/08/2020 0500   PO2ART 99 06/08/2020 0500   HCO3 21.1 06/08/2020 0500   ACIDBASEDEF 4.5 (H) 06/08/2020 0500   O2SAT 97.2 06/08/2020 0500     Coagulation Profile: Recent Labs  Lab 06/05/20 0838  INR 1.2    Cardiac Enzymes: Recent Labs  Lab 06/06/20 1619 06/09/20 0901  CKTOTAL 85 506*    HbA1C: Hgb A1c MFr Bld  Date/Time Value Ref Range Status  06/04/2020 09:48 PM 7.4 (H) 4.8 - 5.6 % Final    Comment:    (NOTE)         Prediabetes: 5.7 - 6.4         Diabetes: >6.4         Glycemic control for adults with diabetes: <7.0     CBG: Recent Labs  Lab 06/11/20 1611 06/11/20 1930 06/11/20 2341 06/12/20 0334 06/12/20 0723  GLUCAP 240* 176* 217* 211* 187*    Allergies No Known Allergies     DVT/GI PRX  assessed I Assessed the need for Labs I Assessed the need for Foley I Assessed the need for Central  Venous Line Family Discussion when available I Assessed the need for Mobilization I made an Assessment of medications to be adjusted accordingly Safety Risk assessment  completed  CASE DISCUSSED IN MULTIDISCIPLINARY ROUNDS WITH ICU TEAM     Critical Care Time devoted to patient care services described in this note is 50 minutes.  Critical care was necessary to treat or prevent imminent or life-threatening deterioration. Overall, patient is critically ill, prognosis is guarded.  Patient with Multiorgan failure and at high risk for cardiac arrest and death.    Corrin Parker, M.D.  [facility]  Medical Director [place] Director [facility] Cardio-Pulmonary Department

## 2020-06-13 ENCOUNTER — Inpatient Hospital Stay: Payer: No Typology Code available for payment source

## 2020-06-13 DIAGNOSIS — G9341 Metabolic encephalopathy: Secondary | ICD-10-CM | POA: Diagnosis not present

## 2020-06-13 DIAGNOSIS — J9601 Acute respiratory failure with hypoxia: Secondary | ICD-10-CM

## 2020-06-13 DIAGNOSIS — J189 Pneumonia, unspecified organism: Secondary | ICD-10-CM | POA: Diagnosis not present

## 2020-06-13 DIAGNOSIS — Z789 Other specified health status: Secondary | ICD-10-CM

## 2020-06-13 LAB — BASIC METABOLIC PANEL
Anion gap: 9 (ref 5–15)
BUN: 119 mg/dL — ABNORMAL HIGH (ref 8–23)
CO2: 20 mmol/L — ABNORMAL LOW (ref 22–32)
Calcium: 7.5 mg/dL — ABNORMAL LOW (ref 8.9–10.3)
Chloride: 113 mmol/L — ABNORMAL HIGH (ref 98–111)
Creatinine, Ser: 5.71 mg/dL — ABNORMAL HIGH (ref 0.61–1.24)
GFR, Estimated: 11 mL/min — ABNORMAL LOW (ref >60)
Glucose, Bld: 188 mg/dL — ABNORMAL HIGH (ref 70–99)
Potassium: 3.7 mmol/L (ref 3.5–5.1)
Sodium: 142 mmol/L (ref 135–145)

## 2020-06-13 LAB — CBC
HCT: 26.2 % — ABNORMAL LOW (ref 39.0–52.0)
Hemoglobin: 8.7 g/dL — ABNORMAL LOW (ref 13.0–17.0)
MCH: 29.5 pg (ref 26.0–34.0)
MCHC: 33.2 g/dL (ref 30.0–36.0)
MCV: 88.8 fL (ref 80.0–100.0)
Platelets: 232 10*3/uL (ref 150–400)
RBC: 2.95 MIL/uL — ABNORMAL LOW (ref 4.22–5.81)
RDW: 16.4 % — ABNORMAL HIGH (ref 11.5–15.5)
WBC: 6.4 10*3/uL (ref 4.0–10.5)
nRBC: 0 % (ref 0.0–0.2)

## 2020-06-13 LAB — GLUCOSE, CAPILLARY
Glucose-Capillary: 202 mg/dL — ABNORMAL HIGH (ref 70–99)
Glucose-Capillary: 192 mg/dL — ABNORMAL HIGH (ref 70–99)
Glucose-Capillary: 194 mg/dL — ABNORMAL HIGH (ref 70–99)
Glucose-Capillary: 233 mg/dL — ABNORMAL HIGH (ref 70–99)
Glucose-Capillary: 243 mg/dL — ABNORMAL HIGH (ref 70–99)
Glucose-Capillary: 194 mg/dL — ABNORMAL HIGH (ref 70–99)

## 2020-06-13 LAB — PHOSPHORUS: Phosphorus: 7.3 mg/dL — ABNORMAL HIGH (ref 2.5–4.6)

## 2020-06-13 LAB — MPO/PR-3 (ANCA) ANTIBODIES
ANCA Proteinase 3: <3.5 U/mL (ref 0.0–3.5)
Myeloperoxidase Abs: <9 U/mL (ref 0.0–9.0)

## 2020-06-13 LAB — MAGNESIUM: Magnesium: 2.5 mg/dL — ABNORMAL HIGH (ref 1.7–2.4)

## 2020-06-13 LAB — FUNGITELL, SERUM

## 2020-06-13 IMAGING — DX DG CHEST 1V PORT
1 series · 1 of 1 positions shown · non-contrast
Comparison: [DATE]

CLINICAL DATA: Acute respiratory failure with hypoxia.

EXAM:
PORTABLE CHEST 1 VIEW

[chest ap]
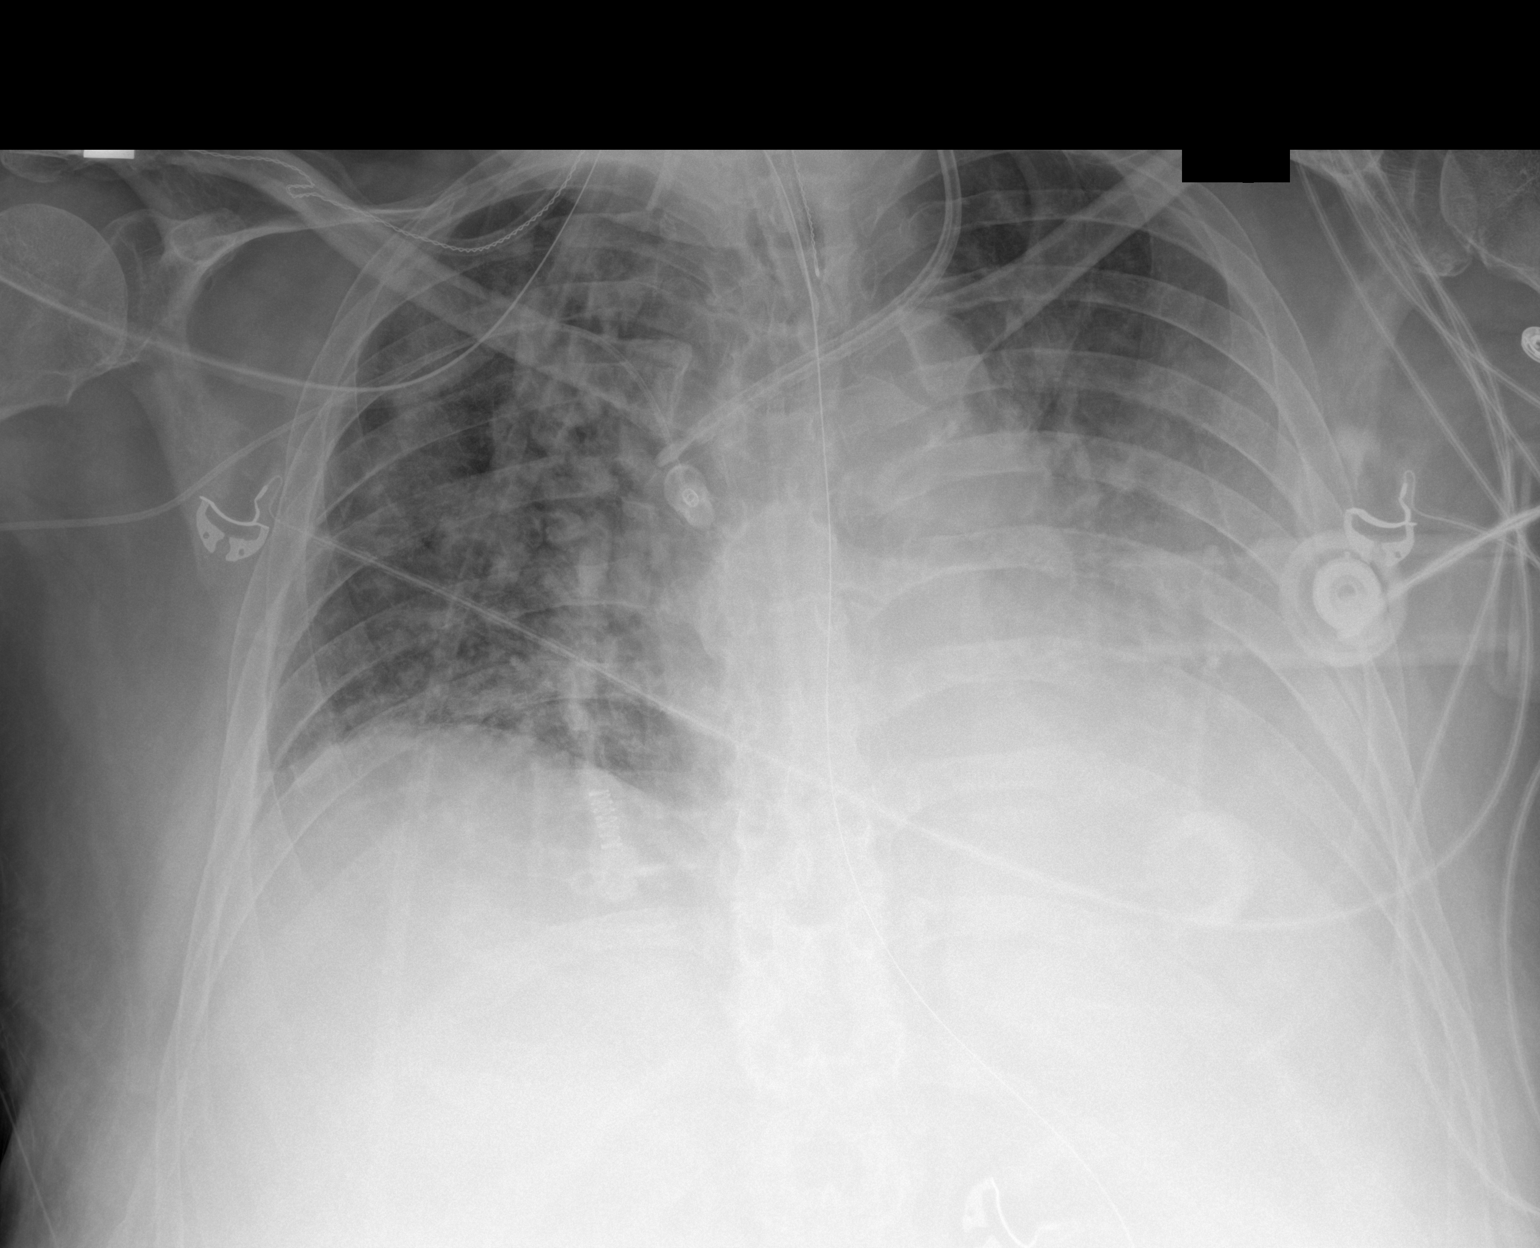

[1 of 1 positions shown; findings below may reference images not displayed]

FINDINGS: Increased pulmonary interstitium is identified in bilateral lungs
consistent with pulmonary edema. There is interval better aeration
of the left upper lobe. Persistent consolidation of the left mid and
lung base is noted. Endotracheal tube is identified with distal tip
5.4 cm from carina. Nasogastric tube is identified with distal tip
not included on film but is at least in the stomach. Right PICC line
is identified distal tip in the superior vena cava.
IMPRESSION: 1. Pulmonary edema.
2. Improved aeration of the left upper lobe. Persistent
consolidation of the left mid and lung base.

## 2020-06-13 MED ORDER — FUROSEMIDE 10 MG/ML IJ SOLN
60.0000 mg | Freq: Once | INTRAMUSCULAR | Status: AC
  Administered 2020-06-13: 60 mg via INTRAVENOUS
  Filled 2020-06-13: qty 6

## 2020-06-13 MED ORDER — DIAZEPAM 5 MG PO TABS
5.0000 mg | ORAL_TABLET | Freq: Four times a day (QID) | ORAL | Status: DC
  Administered 2020-06-13 – 2020-06-18 (×21): 5 mg
  Filled 2020-06-13 (×21): qty 1

## 2020-06-13 MED ORDER — DEXMEDETOMIDINE HCL IN NACL 400 MCG/100ML IV SOLN
0.4000 ug/kg/h | INTRAVENOUS | Status: DC
  Administered 2020-06-13: 0.8 ug/kg/h via INTRAVENOUS
  Administered 2020-06-13: 0.4 ug/kg/h via INTRAVENOUS
  Administered 2020-06-13: 0.8 ug/kg/h via INTRAVENOUS
  Administered 2020-06-13: 0.6 ug/kg/h via INTRAVENOUS
  Administered 2020-06-14: 0.9 ug/kg/h via INTRAVENOUS
  Administered 2020-06-14 – 2020-06-16 (×8): 0.8 ug/kg/h via INTRAVENOUS
  Administered 2020-06-16 – 2020-06-17 (×3): 1.2 ug/kg/h via INTRAVENOUS
  Administered 2020-06-17 – 2020-06-18 (×2): 0.4 ug/kg/h via INTRAVENOUS
  Administered 2020-06-18: 0.8 ug/kg/h via INTRAVENOUS
  Administered 2020-06-19: 0.6 ug/kg/h via INTRAVENOUS
  Filled 2020-06-13 (×23): qty 100

## 2020-06-13 MED ORDER — CHLORHEXIDINE GLUCONATE 0.12 % MT SOLN
OROMUCOSAL | Status: AC
  Administered 2020-06-13: 15 mL via OROMUCOSAL
  Filled 2020-06-13: qty 15

## 2020-06-13 MED ORDER — LABETALOL HCL 5 MG/ML IV SOLN
10.0000 mg | Freq: Once | INTRAVENOUS | Status: AC
  Administered 2020-06-13: 10 mg via INTRAVENOUS

## 2020-06-13 NOTE — Plan of Care (Signed)
Neuro: tolerating sedation weans/changes, intermittently grimaces with oral care  Resp: Tolerating ventilator settings/weans well CV: TMAX 38.1-Ice packs utilized GIGU: foley in place, flexiseal in place, tolerating feeds well Skin: intact Social: Dr. Belia Heman spoke with brother for formal update.  Events: Hemodialysis today, sedation weaned  Problem: Education: Goal: Knowledge of General Education information will improve Description: Including pain rating scale, medication(s)/side effects and non-pharmacologic comfort measures Outcome: Not Progressing   Problem: Health Behavior/Discharge Planning: Goal: Ability to manage health-related needs will improve Outcome: Not Progressing   Problem: Clinical Measurements: Goal: Ability to maintain clinical measurements within normal limits will improve Outcome: Not Progressing Goal: Will remain free from infection Outcome: Not Progressing Goal: Diagnostic test results will improve Outcome: Not Progressing Goal: Respiratory complications will improve Outcome: Not Progressing Goal: Cardiovascular complication will be avoided Outcome: Not Progressing   Problem: Activity: Goal: Risk for activity intolerance will decrease Outcome: Not Progressing   Problem: Nutrition: Goal: Adequate nutrition will be maintained Outcome: Not Progressing   Problem: Coping: Goal: Level of anxiety will decrease Outcome: Not Progressing   Problem: Elimination: Goal: Will not experience complications related to bowel motility Outcome: Not Progressing Goal: Will not experience complications related to urinary retention Outcome: Not Progressing   Problem: Pain Managment: Goal: General experience of comfort will improve Outcome: Not Progressing   Problem: Safety: Goal: Ability to remain free from injury will improve Outcome: Not Progressing   Problem: Skin Integrity: Goal: Risk for impaired skin integrity will decrease Outcome: Not Progressing

## 2020-06-13 NOTE — Progress Notes (Signed)
ID   BP (!) 161/73   Pulse 97   Temp 99.5 F (37.5 C)   Resp (!) 22   Ht 5\' 6"  (1.676 m)   Wt 102.1 kg   SpO2 94%   BMI 36.33 kg/m      Remains intubated Getting Hemodialysis  Chest b/l air entry- decreased left side - crepts left side Tachycardia Foley IJ   CBC Latest Ref Rng & Units 06/13/2020 06/12/2020 06/11/2020  WBC 4.0 - 10.5 K/uL 6.4 7.5 10.1  Hemoglobin 13.0 - 17.0 g/dL 08/11/2020) 3.4(K) 10.1(L)  Hematocrit 39.0 - 52.0 % 26.2(L) 28.8(L) 29.4(L)  Platelets 150 - 400 K/uL 232 205 167     CMP Latest Ref Rng & Units 06/13/2020 06/12/2020 06/11/2020  Glucose 70 - 99 mg/dL 08/11/2020) 811(X) 726(O)  BUN 8 - 23 mg/dL 035(D) 974(B) 638(G)  Creatinine 0.61 - 1.24 mg/dL 53(M) 4.68(E) 3.21(Y)  Sodium 135 - 145 mmol/L 142 142 142  Potassium 3.5 - 5.1 mmol/L 3.7 3.6 3.8  Chloride 98 - 111 mmol/L 113(H) 111 108  CO2 22 - 32 mmol/L 20(L) 22 22  Calcium 8.9 - 10.3 mg/dL 7.5(L) 8.0(L) 8.2(L)  Total Protein 6.5 - 8.1 g/dL - - -  Total Bilirubin 0.3 - 1.2 mg/dL - - -  Alkaline Phos 38 - 126 U/L - - -  AST 15 - 41 U/L - - -  ALT 0 - 44 U/L - - -     Impression/recommendation Acute hypoxic respiratory failure due to left sided pneumonia   On  levaquin and Doxy (for possible TBI) Recommend repeating CXR   Encephalopathy- MRI no focal lesion   AKI- combination of sepsis and contrast Getting HD today   Smoker   Neurogenic bladder   H/o transverse myelitis   Discussed with care team

## 2020-06-13 NOTE — Progress Notes (Signed)
 NAME:  Wyatt Hebdon Jr., MRN:  [id number], DOB:  03/03/1960, LOS: 9 ADMISSION DATE:  06/04/2020 60 y.o. male with medical history significant for HTN, PTSD, diabetes, migraines, GERD, neurogenic bladder secondary to remote history of transverse myelitis, who self catheterizes and has frequent UTIs as well as chronic back pain on chronic opiates who at baseline is independent - brought to the ER with a 3-day history of altered mental status.   + cough, shortness of breath and fever as well as vomiting and diarrhea and started becoming increasingly confused in the 24 hours prior to arrival.       Unable to get any meaningful history from patient.   ED course: On arrival, temperature 99.3, BP 167/67, pulse 99, respirations 18 with O2 sat 96% on room air.  Blood work significant for leukocytosis of 13,600 with lactic acid of 1.2.  Ammonia level normal at 15, EtOH less than 10.  Sodium 130, potassium 3.2, glucose 249 creatinine 1.06.  Urinalysis with 80 ketones and rare bacteria.  UDS positive for opiates and tricyclic's.  COVID and flu negative       Imaging:   CT chest abdomen and pelvis without contrast consistent with pneumonia.  Multiple nodules left upper lobe likely infectious or inflammatory with follow-up imaging recommended to document resolution.   Cardiomegaly with trace pericardial effusion   Patient was started on sepsis fluids and broad-spectrum antibiotics initially for sepsis of unknown source.       Significant Hospital Events: Including procedures, antibiotic start and stop dates in addition to other pertinent events   5/31 admitted for sepsis present on admission for Left lung pneumonia 6/1 progressive confusion and hypoxia 6/2 transferred to ICU, INTUBATED, S/P BRONCH 6/3 high fevers, remains ill, SELF EXTUBATED 6/3 emergently re-intubated 6/5 failure to wean from v ent 6/6 failure to wean from vent, VASC CATH PLACED 6/7 HD started remains critically ill 6/8  remains on vent 6/9 waiting for family for SAT   Interim History / Subjective:  Multiorgan failure Remains critically ill +diastolic dysfunction Plan for precedex and weaning trials  Await for family Consider HD again today   CBC    Component Value Date/Time   WBC 6.4 06/13/2020 0438   RBC 2.95 (L) 06/13/2020 0438   HGB 8.7 (L) 06/13/2020 0438   HCT 26.2 (L) 06/13/2020 0438   PLT 232 06/13/2020 0438   MCV 88.8 06/13/2020 0438   MCH 29.5 06/13/2020 0438   MCHC 33.2 06/13/2020 0438   RDW 16.4 (H) 06/13/2020 0438   LYMPHSABS 0.8 06/12/2020 0435   MONOABS 0.6 06/12/2020 0435   EOSABS 0.2 06/12/2020 0435   BASOSABS 0.0 06/12/2020 0435     BMP Latest Ref Rng & Units 06/13/2020 06/12/2020 06/11/2020  Glucose 70 - 99 mg/dL 188(H) 214(H) 255(H)  BUN 8 - 23 mg/dL 119(H) 112(H) 86(H)  Creatinine 0.61 - 1.24 mg/dL 5.71(H) 6.02(H) 4.98(H)  Sodium 135 - 145 mmol/L 142 142 142  Potassium 3.5 - 5.1 mmol/L 3.7 3.6 3.8  Chloride 98 - 111 mmol/L 113(H) 111 108  CO2 22 - 32 mmol/L 20(L) 22 22  Calcium 8.9 - 10.3 mg/dL 7.5(L) 8.0(L) 8.2(L)       Objective   Blood pressure (!) 172/68, pulse (!) 116, temperature 99.5 F (37.5 C), resp. rate 16, height 5' 6" (1.676 m), weight 102.1 kg, SpO2 95 %.    Vent Mode: PRVC FiO2 (%):  [40 %-45 %] 40 % Set Rate:  [16 bmp] 16 bmp Vt Set:  [  520 mL] 520 mL PEEP:  [5 cmH20] 5 cmH20   Intake/Output Summary (Last 24 hours) at 06/13/2020 0733 Last data filed at 06/13/2020 0613 Gross per 24 hour  Intake 4628.62 ml  Output 2665 ml  Net 1963.62 ml   Filed Weights   06/11/20 0453 06/12/20 0429 06/13/20 0412  Weight: 98.6 kg 99.4 kg 102.1 kg      REVIEW OF SYSTEMS  PATIENT IS UNABLE TO PROVIDE COMPLETE REVIEW OF SYSTEMS DUE TO SEVERE CRITICAL ILLNESS AND TOXIC METABOLIC ENCEPHALOPATHY   PHYSICAL EXAMINATION:  GENERAL:critically ill appearing, +resp distress HEAD: Normocephalic, atraumatic.  EYES: Pupils equal, round, reactive to light.  No  scleral icterus.  MOUTH: Moist mucosal membrane. NECK: Supple. PULMONARY: +rhonchi, +wheezing CARDIOVASCULAR: S1 and S2. Regular rate and rhythm. No murmurs, rubs, or gallops.  GASTROINTESTINAL: Soft, nontender, -distended. Positive bowel sounds.  MUSCULOSKELETAL: No swelling, clubbing, or edema.  NEUROLOGIC: obtunded SKIN:intact,warm,dry   Labs/imaging that I havepersonally reviewed  (right click and "Reselect all SmartList Selections" daily)      ASSESSMENT AND PLAN SYNOPSIS  60 yo white make with acute and severe hypoxic respiratory failure with acute and severe toxic metabolic encephalopathy with acute COPD Exacerbation LUL/LL multifocal pneumonia Self extubated and re intubated emergently, now with progressive renal failure    Severe ACUTE Hypoxic and Hypercapnic Respiratory Failure -continue Mechanical Ventilator support -continue Bronchodilator Therapy -Wean Fio2 and PEEP as tolerated -VAP/VENT bundle implementation -will perform SAT/SBT when respiratory parameters are met   SEVERE COPD EXACERBATION -continue IV steroids as prescribed -continue NEB THERAPY as prescribed   CARDIAC FAILURE-acute  systolic dysfunction -oxygen as needed -follow up cardiac enzymes as indicated   CARDIAC ICU monitoring   ACUTE KIDNEY INJURY/Renal Failure -continue Foley Catheter-assess need -Avoid nephrotoxic agents -Follow urine output, BMP -Ensure adequate renal perfusion, optimize oxygenation -Renal dose medications Probably needs HD today   NEUROLOGY Acute toxic metabolic encephalopathy, need for sedation Goal RASS -2 to -3   SEPTIC SHOCK -use vasopressors to keep MAP>65 as needed -follow ABG and LA -follow up cultures -emperic ABX  INFECTIOUS DISEASE -continue antibiotics as prescribed -follow up cultures -follow up ID consultation  MRSA NEG RVP NEGATIVE COVID NEG INF AB NEG SPUTUM CX NG AFB SMEAR NEG HIV NEG  ENDO - ICU hypoglycemic\Hyperglycemia  protocol -check FSBS per protocol   GI GI PROPHYLAXIS as indicated  NUTRITIONAL STATUS DIET-->TF's as tolerated Constipation protocol as indicated   ELECTROLYTES -follow labs as needed -replace as needed -pharmacy consultation and following     Best practice (right click and "Reselect all SmartList Selections" daily)  Diet:  NPO Pain/Anxiety/Delirium protocol (if indicated): Yes (RASS goal -2) VAP protocol (if indicated): Yes DVT prophylaxis: Subcutaneous Heparin GI prophylaxis: H2B Glucose control:  SSI Yes Central venous access:  N/A Arterial line:  N/A Foley:  N/A Mobility:  bed rest  PT consulted: N/A Code Status:  full code Disposition: ICU   Labs   CBC: Recent Labs  Lab 06/09/20 0511 06/10/20 0450 06/11/20 0449 06/12/20 0435 06/13/20 0438  WBC 5.8 6.8 10.1 7.5 6.4  NEUTROABS  --   --  9.1* 5.7  --   HGB 11.4* 10.5* 10.1* 9.6* 8.7*  HCT 34.1* 31.7* 29.4* 28.8* 26.2*  MCV 88.6 89.5 85.7 87.8 88.8  PLT 122* 134* 167 205 232    Basic Metabolic Panel: Recent Labs  Lab 06/08/20 1729 06/09/20 0511 06/10/20 0450 06/11/20 0449 06/12/20 0435 06/13/20 0438  NA  --  142 141 142 142 142  K  --    3.2* 3.3* 3.8 3.6 3.7  CL  --  112* 111 108 111 113*  CO2  --  19* 18* 22 22 20*  GLUCOSE  --  174* 154* 255* 214* 188*  BUN  --  61* 81* 86* 112* 119*  CREATININE  --  3.82* 5.47* 4.98* 6.02* 5.71*  CALCIUM  --  8.3* 8.0* 8.2* 8.0* 7.5*  MG 2.5* 2.5* 2.6* 2.6*  --  2.5*  PHOS 4.3 5.9* 7.7* 7.5*  --  7.3*   GFR: Estimated Creatinine Clearance: 15.2 mL/min (A) (by C-G formula based on SCr of 5.71 mg/dL (H)). Recent Labs  Lab 06/10/20 0450 06/11/20 0449 06/12/20 0435 06/13/20 0438  WBC 6.8 10.1 7.5 6.4    Liver Function Tests: Recent Labs  Lab 06/06/20 1918 06/09/20 0511 06/10/20 0450 06/11/20 0449  AST 191* 80*  --   --   ALT 110* 68*  --   --   ALKPHOS 68 93  --   --   BILITOT 1.4* 0.9  --   --   PROT 6.7 4.8*  --   --   ALBUMIN 2.9* 1.7*  1.6* 2.4*   No results for input(s): LIPASE, AMYLASE in the last 168 hours. No results for input(s): AMMONIA in the last 168 hours.  ABG    Component Value Date/Time   PHART 7.33 (L) 06/08/2020 0500   PCO2ART 40 06/08/2020 0500   PO2ART 99 06/08/2020 0500   HCO3 21.1 06/08/2020 0500   ACIDBASEDEF 4.5 (H) 06/08/2020 0500   O2SAT 97.2 06/08/2020 0500     Coagulation Profile: No results for input(s): INR, PROTIME in the last 168 hours.  Cardiac Enzymes: Recent Labs  Lab 06/06/20 1619 06/09/20 0901  CKTOTAL 85 506*    HbA1C: Hgb A1c MFr Bld  Date/Time Value Ref Range Status  06/04/2020 09:48 PM 7.4 (H) 4.8 - 5.6 % Final    Comment:    (NOTE)         Prediabetes: 5.7 - 6.4         Diabetes: >6.4         Glycemic control for adults with diabetes: <7.0     CBG: Recent Labs  Lab 06/12/20 1520 06/12/20 1924 06/12/20 2313 06/13/20 0324 06/13/20 0730  GLUCAP 252* 240* 246* 202* 192*    Allergies No Known Allergies     DVT/GI PRX  assessed I Assessed the need for Labs I Assessed the need for Foley I Assessed the need for Central Venous Line Family Discussion when available I Assessed the need for Mobilization I made an Assessment of medications to be adjusted accordingly Safety Risk assessment completed  CASE DISCUSSED IN MULTIDISCIPLINARY ROUNDS WITH ICU TEAM     Critical Care Time devoted to patient care services described in this note is 60 minutes.  Critical care was necessary to treat or prevent imminent or life-threatening deterioration. Overall, patient is critically ill, prognosis is guarded.  Patient with Multiorgan failure and at high risk for cardiac arrest and death.     David , M.D.  [facility]  Medical Director [facility] Medical Director [facility] Cardio-Pulmonary Department        

## 2020-06-13 NOTE — Progress Notes (Signed)
Dialyzed patient for 2.5 hrs via L CVC. Patient tolerated without incident. UF goal 0 as per orders. Remains intubated and sedated.  Full report given to ICU RN

## 2020-06-13 NOTE — Consult Note (Signed)
PHARMACY CONSULT NOTE  Pharmacy Consult for Electrolyte Monitoring and Replacement   Recent Labs: Potassium (mmol/L)  Date Value  06/13/2020 3.7   Magnesium (mg/dL)  Date Value  01/75/1025 2.5 (H)   Calcium (mg/dL)  Date Value  85/27/7824 7.5 (L)   Albumin (g/dL)  Date Value  23/53/6144 2.4 (L)   Phosphorus (mg/dL)  Date Value  31/54/0086 7.3 (H)   Sodium (mmol/L)  Date Value  06/13/2020 142   Assessment: Patient is a 60 y/o M with medical history including diabetes, migraines, GERD, neurogenic bladder, HTN, chronic opioid use who is admitted with sepsis secondary to pneumonia. Patient is admitted to the ICU and is currently intubated, sedated, and on mechanical ventilation.   Pharmacy consulted to assist with electrolyte monitoring and replacement as indicated. Patient received first HD treatment 6/6. Planning to assess daily for HD needs. Furosemide 60 mg IV x 1 given 6/8.  Nutrition: tube feeds + free water flushes 100 mL q4h  Goal of Therapy:  Electrolytes within normal limits  Plan:  --No replacement indicated today --Monitor electrolytes with AM labs  Pricilla Riffle, PharmD 06/13/2020 1:30 PM

## 2020-06-13 NOTE — Progress Notes (Addendum)
GOALS OF CARE DISCUSSION  The Clinical status was relayed to family in detail. [employer] and notified of patients medical condition.    Patient remains unresponsive and will not open eyes to command.   Patient is having a weak cough and struggling to remove secretions.   Patient with increased WOB and using accessory muscles to breathe Explained to family course of therapy and the modalities    Patient with Progressive multiorgan failure with a very high probablity of a very minimal chance of meaningful recovery despite all aggressive and optimal medical therapy.  PATIENT REMAINS FULL CODE  Family understands the situation. Patient with multiorgan failure  I have asked family to come to bedside  to witness SAT/SBT's At this time, family is NOT available and we will plan to perform SAT/SBT's when able  Prognosis seems guarded  Family are satisfied with Plan of action and management. All questions answered  Additional CC time 18 mins   Zion Lint Santiago Glad, M.D.  [facility]  Medical Director [facility] Medical Director [facility] Cardio-Pulmonary Department

## 2020-06-13 NOTE — Progress Notes (Signed)
[place], [place] 06/13/20  Subjective:   Hospital day # 9 Remains sedated with fentanyl and Versed, however patient still does not have adequate response even though sedation is being weaned off. No acute events Good response to IV Lasix yesterday Nurse manipulated the Foley, that seem to have helped Continues to be ventilator dependent.  FiO2 40%  Tube feeds going at 60 cc/h   Renal: 06/08 0701 - 06/09 0700 In: 4628.6 [I.V.:1112.7; TK/PT:4656; IV Piggyback:649.9] Out: 2665 [Urine:2490; Stool:175] Lab Results  Component Value Date   CREATININE 5.71 (H) 06/13/2020   CREATININE 6.02 (H) 06/12/2020   CREATININE 4.98 (H) 06/11/2020   Serum creatinine slightly improved compared to yesterday  Objective:  Vital signs in last 24 hours:  Temp:  [97.2 F (36.2 C)-100.4 F (38 C)] 99.68 F (37.6 C) (06/09 0800) Pulse Rate:  [97-122] 122 (06/09 0800) Resp:  [12-22] 17 (06/09 0800) BP: (145-188)/(59-76) 188/74 (06/09 0800) SpO2:  [91 %-99 %] 95 % (06/09 0800) FiO2 (%):  [40 %] 40 % (06/08 2345) Weight:  [102.1 kg] 102.1 kg (06/09 0412)  Weight change: 2.7 kg Filed Weights   06/11/20 0453 06/12/20 0429 06/13/20 0412  Weight: 98.6 kg 99.4 kg 102.1 kg    Intake/Output:    Intake/Output Summary (Last 24 hours) at 06/13/2020 0842 Last data filed at 06/13/2020 0800 Gross per 24 hour  Intake 4483.72 ml  Output 2645 ml  Net 1838.72 ml     Physical Exam:  General: No acute distress, laying in the bed, critically ill-appearing  HEENT anicteric,  ETT, OG tube  Pulm/lungs coarse breath sounds, ventilator assisted  CVS/Heart regular rhythm, tachycardic  Abdomen:  Soft, nontender  Extremities: + dependent peripheral edema  Neurologic: Sedated  Skin:  Warm, dry  Coud catheter in place  Rectal tube in place, with liquidy stools   Basic Metabolic Panel:  Recent Labs  Lab 06/08/20 1729 06/09/20 0511 06/10/20 0450 06/11/20 0449 06/12/20 0435  06/13/20 0438  NA  --  142 141 142 142 142  K  --  3.2* 3.3* 3.8 3.6 3.7  CL  --  112* 111 108 111 113*  CO2  --  19* 18* 22 22 20*  GLUCOSE  --  174* 154* 255* 214* 188*  BUN  --  61* 81* 86* 112* 119*  CREATININE  --  3.82* 5.47* 4.98* 6.02* 5.71*  CALCIUM  --  8.3* 8.0* 8.2* 8.0* 7.5*  MG 2.5* 2.5* 2.6* 2.6*  --  2.5*  PHOS 4.3 5.9* 7.7* 7.5*  --  7.3*      CBC: Recent Labs  Lab 06/09/20 0511 06/10/20 0450 06/11/20 0449 06/12/20 0435 06/13/20 0438  WBC 5.8 6.8 10.1 7.5 6.4  NEUTROABS  --   --  9.1* 5.7  --   HGB 11.4* 10.5* 10.1* 9.6* 8.7*  HCT 34.1* 31.7* 29.4* 28.8* 26.2*  MCV 88.6 89.5 85.7 87.8 88.8  PLT 122* 134* 167 205 232       Lab Results  Component Value Date   HEPBSAG NON REACTIVE 06/09/2020   HEPBSAB NON REACTIVE 06/09/2020      Microbiology:  Recent Results (from the past 240 hour(s))  Blood culture (single)     Status: Abnormal   Collection Time: 06/04/20  5:18 PM   Specimen: BLOOD  Result Value Ref Range Status   Specimen Description   Final    BLOOD BLOOD RIGHT HAND Performed at [facility], [address], [place], [place] [address]  Special Requests   Final    BOTTLES DRAWN AEROBIC AND ANAEROBIC Blood Culture adequate volume Performed at [facility], [address] [place], [place] [address]    Culture  Setup Time   Final    GRAM POSITIVE COCCI IN BOTH AEROBIC AND ANAEROBIC BOTTLES CRITICAL RESULT CALLED TO, READ BACK BY AND VERIFIED WITH: Elvera Maria [id number] 06/06/2020 DLB Performed at [facility], [address] [place], [place] [address]    Culture (A)  Final    STAPHYLOCOCCUS EPIDERMIDIS THE SIGNIFICANCE OF ISOLATING THIS ORGANISM FROM A SINGLE SET OF BLOOD CULTURES WHEN MULTIPLE SETS ARE DRAWN IS UNCERTAIN. PLEASE NOTIFY THE MICROBIOLOGY DEPARTMENT WITHIN ONE WEEK IF SPECIATION AND SENSITIVITIES ARE REQUIRED. MICROCOCCUS SPECIES Standardized susceptibility testing for this organism is not  available. KOCURIA RHIZOPHILIA Performed at [facility], [place] [address] [place], [place] [address]    Report Status 06/10/2020 FINAL  Final  Resp Panel by RT-PCR (Flu A&B, Covid) Nasopharyngeal Swab     Status: None   Collection Time: 06/04/20  5:18 PM   Specimen: Nasopharyngeal Swab; Nasopharyngeal(NP) swabs in vial transport medium  Result Value Ref Range Status   SARS Coronavirus 2 by RT PCR NEGATIVE NEGATIVE Final    Comment: (NOTE) SARS-CoV-2 target nucleic acids are NOT DETECTED.  The SARS-CoV-2 RNA is generally detectable in upper respiratory specimens during the acute phase of infection. The lowest concentration of SARS-CoV-2 viral copies this assay can detect is 138 copies/mL. A negative result does not preclude SARS-Cov-2 infection and should not be used as the sole basis for treatment or other patient management decisions. A negative result may occur with  improper specimen collection/handling, submission of specimen other than nasopharyngeal swab, presence of viral mutation(s) within the areas targeted by this assay, and inadequate number of viral copies(<138 copies/mL). A negative result must be combined with clinical observations, patient history, and epidemiological information. The expected result is Negative.  Fact Sheet for Patients:  EntrepreneurPulse.com.au  Fact Sheet for Healthcare Providers:  IncredibleEmployment.be  This test is no t yet approved or cleared by the [place] FDA and  has been authorized for detection and/or diagnosis of SARS-CoV-2 by FDA under an Emergency Use Authorization (EUA). This EUA will remain  in effect (meaning this test can be used) for the duration of the COVID-19 declaration under Section 564(b)(1) of the Act, 21 U.S.C.section 360bbb-3(b)(1), unless the authorization is terminated  or revoked sooner.       Influenza A by PCR NEGATIVE NEGATIVE Final   Influenza B by PCR  NEGATIVE NEGATIVE Final    Comment: (NOTE) The Xpert Xpress SARS-CoV-2/FLU/RSV plus assay is intended as an aid in the diagnosis of influenza from Nasopharyngeal swab specimens and should not be used as a sole basis for treatment. Nasal washings and aspirates are unacceptable for Xpert Xpress SARS-CoV-2/FLU/RSV testing.  Fact Sheet for Patients: EntrepreneurPulse.com.au  Fact Sheet for Healthcare Providers: IncredibleEmployment.be  This test is not yet approved or cleared by the [place] FDA and has been authorized for detection and/or diagnosis of SARS-CoV-2 by FDA under an Emergency Use Authorization (EUA). This EUA will remain in effect (meaning this test can be used) for the duration of the COVID-19 declaration under Section 564(b)(1) of the Act, 21 U.S.C. section 360bbb-3(b)(1), unless the authorization is terminated or revoked.  Performed at [facility], [address], [place], [place] [address]   Blood Culture ID Panel (Reflexed)     Status: Abnormal   Collection  Time: 06/04/20  5:18 PM  Result Value Ref Range Status   Enterococcus faecalis NOT DETECTED NOT DETECTED Final   Enterococcus Faecium NOT DETECTED NOT DETECTED Final   Listeria monocytogenes NOT DETECTED NOT DETECTED Final   Staphylococcus species DETECTED (A) NOT DETECTED Final    Comment: CRITICAL RESULT CALLED TO, READ BACK BY AND VERIFIED WITH: [facility] MITCHELL 3149 06/06/2020 DLB    Staphylococcus aureus (BCID) NOT DETECTED NOT DETECTED Final   Staphylococcus epidermidis DETECTED (A) NOT DETECTED Final    Comment: CRITICAL RESULT CALLED TO, READ BACK BY AND VERIFIED WITH: Elvera Maria [id number] 06/06/2020 DLB    Staphylococcus lugdunensis NOT DETECTED NOT DETECTED Final   Streptococcus species NOT DETECTED NOT DETECTED Final   Streptococcus agalactiae NOT DETECTED NOT DETECTED Final   Streptococcus pneumoniae NOT DETECTED NOT DETECTED Final   Streptococcus  pyogenes NOT DETECTED NOT DETECTED Final   A.calcoaceticus-baumannii NOT DETECTED NOT DETECTED Final   Bacteroides fragilis NOT DETECTED NOT DETECTED Final   Enterobacterales NOT DETECTED NOT DETECTED Final   Enterobacter cloacae complex NOT DETECTED NOT DETECTED Final   Escherichia coli NOT DETECTED NOT DETECTED Final   Klebsiella aerogenes NOT DETECTED NOT DETECTED Final   Klebsiella oxytoca NOT DETECTED NOT DETECTED Final   Klebsiella pneumoniae NOT DETECTED NOT DETECTED Final   Proteus species NOT DETECTED NOT DETECTED Final   Salmonella species NOT DETECTED NOT DETECTED Final   Serratia marcescens NOT DETECTED NOT DETECTED Final   Haemophilus influenzae NOT DETECTED NOT DETECTED Final   Neisseria meningitidis NOT DETECTED NOT DETECTED Final   Pseudomonas aeruginosa NOT DETECTED NOT DETECTED Final   Stenotrophomonas maltophilia NOT DETECTED NOT DETECTED Final   Candida albicans NOT DETECTED NOT DETECTED Final   Candida auris NOT DETECTED NOT DETECTED Final   Candida glabrata NOT DETECTED NOT DETECTED Final   Candida krusei NOT DETECTED NOT DETECTED Final   Candida parapsilosis NOT DETECTED NOT DETECTED Final   Candida tropicalis NOT DETECTED NOT DETECTED Final   Cryptococcus neoformans/gattii NOT DETECTED NOT DETECTED Final   Methicillin resistance mecA/C NOT DETECTED NOT DETECTED Final    Comment: Performed at [facility], [address] [place], [place] [address]  Blood culture (single)     Status: None   Collection Time: 06/04/20  9:48 PM   Specimen: BLOOD  Result Value Ref Range Status   Specimen Description BLOOD BLOOD RIGHT ARM  Final   Special Requests   Final    BOTTLES DRAWN AEROBIC AND ANAEROBIC Blood Culture adequate volume   Culture   Final    NO GROWTH 5 DAYS Performed at [facility], [address], [place], [place] [address]    Report Status 06/09/2020 FINAL  Final  Urine Culture     Status: None   Collection Time: 06/06/20  2:37 PM    Specimen: Urine, Random  Result Value Ref Range Status   Specimen Description   Final    URINE, RANDOM Performed at [facility], [address], [place], [place] [address]    Special Requests   Final    NONE Performed at [facility], [address], [place], [place] [address]    Culture   Final    NO GROWTH Performed at [facility], [address] [place], [place] [address]    Report Status 06/08/2020 FINAL  Final  MRSA PCR Screening     Status: None   Collection Time: 06/06/20  2:40 PM   Specimen: Nasopharyngeal  Result Value Ref Range Status   MRSA by PCR NEGATIVE NEGATIVE Final    Comment:        The GeneXpert MRSA Assay (FDA approved for NASAL specimens only), is one component of a comprehensive MRSA colonization surveillance program. It is not intended to diagnose MRSA infection nor to guide or monitor treatment for MRSA infections. Performed at [facility], [place], [place] [address]   Acid Fast Smear (AFB)     Status: None   Collection Time: 06/06/20  6:30 PM   Specimen: Bronchoalveolar Lavage; Sputum  Result Value Ref Range Status   AFB Specimen Processing Concentration  Final   Acid Fast Smear Negative  Final    Comment: (NOTE) Performed At: [facility] [place], [place] [id number] Rush Farmer MD [id number]    Source (AFB) BRONCHIAL ALVEOLAR LAVAGE  Corrected    Comment: Performed at [facility], [address] [place], [place] [address] CORRECTED ON 06/02 AT 2239: PREVIOUSLY REPORTED AS SPUTUM   Culture, Respiratory w Gram Stain     Status: None   Collection Time: 06/06/20  6:30 PM   Specimen: Bronchoalveolar Lavage  Result Value Ref Range Status   Specimen Description   Final    BRONCHIAL ALVEOLAR LAVAGE Performed at [facility], [address] [place], [place] [address]    Special Requests   Final    NONE Performed at [facility],  [address] [place], [place] [address]    Gram Stain   Final    FEW WBC PRESENT,BOTH PMN AND MONONUCLEAR NO ORGANISMS SEEN    Culture   Final    NO GROWTH 3 DAYS Performed at [facility], [place] [address], [place], [place] [address]    Report Status 06/09/2020 FINAL  Final  Fungus Culture With Stain     Status: None (Preliminary result)   Collection Time: 06/06/20  6:30 PM  Result Value Ref Range Status   Fungus Stain Final report  Final    Comment: (NOTE) Performed At: [facility] [place], [place] [id number] Rush Farmer MD [id number]    Fungus (Mycology) Culture PENDING  Incomplete   Fungal Source BRONCHIAL ALVEOLAR LAVAGE  Final    Comment: Performed at [facility], [place] [address], [place], [place] [address]  Fungus Culture Result     Status: None   Collection Time: 06/06/20  6:30 PM  Result Value Ref Range Status   Result 1 Comment  Final    Comment: (NOTE) KOH/Calcofluor preparation:  no fungus observed. Performed At: [facility] [place], [place] [id number] Rush Farmer MD [id number]   Respiratory (~20 pathogens) panel by PCR     Status: None   Collection Time: 06/07/20  5:50 AM   Specimen: Nasopharyngeal Swab; Respiratory  Result Value Ref Range Status   Adenovirus NOT DETECTED NOT DETECTED Final   Coronavirus 229E NOT DETECTED NOT DETECTED Final    Comment: (NOTE) The Coronavirus on the Respiratory Panel, DOES NOT test for the novel  Coronavirus (2019 nCoV)    Coronavirus HKU1 NOT DETECTED NOT DETECTED Final   Coronavirus NL63 NOT DETECTED NOT DETECTED Final   Coronavirus OC43 NOT DETECTED NOT DETECTED Final   Metapneumovirus NOT DETECTED NOT DETECTED Final   Rhinovirus / Enterovirus NOT DETECTED NOT DETECTED Final   Influenza A NOT DETECTED NOT DETECTED Final   Influenza B NOT DETECTED NOT DETECTED Final   Parainfluenza  Virus 1 NOT DETECTED NOT DETECTED Final   Parainfluenza Virus 2  NOT DETECTED NOT DETECTED Final   Parainfluenza Virus 3 NOT DETECTED NOT DETECTED Final   Parainfluenza Virus 4 NOT DETECTED NOT DETECTED Final   Respiratory Syncytial Virus NOT DETECTED NOT DETECTED Final   Bordetella pertussis NOT DETECTED NOT DETECTED Final   Bordetella Parapertussis NOT DETECTED NOT DETECTED Final   Chlamydophila pneumoniae NOT DETECTED NOT DETECTED Final   Mycoplasma pneumoniae NOT DETECTED NOT DETECTED Final    Comment: Performed at [facility], [place] [address], [place], [place] [address]    Coagulation Studies: No results for input(s): LABPROT, INR in the last 72 hours.  Urinalysis: No results for input(s): COLORURINE, LABSPEC, PHURINE, GLUCOSEU, HGBUR, BILIRUBINUR, KETONESUR, PROTEINUR, UROBILINOGEN, NITRITE, LEUKOCYTESUR in the last 72 hours.  Invalid input(s): APPERANCEUR    Imaging: No results found.    Medications:    sodium chloride 10 mL/hr at 06/13/20 0800   dexmedetomidine (PRECEDEX) IV infusion 0.4 mcg/kg/hr (06/13/20 0800)   doxycycline (VIBRAMYCIN) IV Stopped (06/12/20 2339)   feeding supplement (VITAL HIGH PROTEIN) 1,000 mL (06/13/20 9021)   fentaNYL infusion INTRAVENOUS 300 mcg/hr (06/13/20 0800)   [START ON 06/14/2020] levofloxacin (LEVAQUIN) IV     midazolam Stopped (06/13/20 0403)    budesonide (PULMICORT) nebulizer solution  0.5 mg Nebulization BID   chlorhexidine gluconate (MEDLINE KIT)  15 mL Mouth Rinse BID   Chlorhexidine Gluconate Cloth  6 each Topical Daily   diazepam  5 mg Per Tube Q6H   feeding supplement (PROSource TF)  45 mL Per Tube Daily   free water  100 mL Per Tube Q4H   heparin injection (subcutaneous)  5,000 Units Subcutaneous Q8H   insulin aspart  0-20 Units Subcutaneous Q4H   insulin glargine  10 Units Subcutaneous Daily   ipratropium-albuterol  3 mL Nebulization Q4H   mouth rinse  15 mL Mouth Rinse 10 times per day   methylPREDNISolone (SOLU-MEDROL) injection  20 mg Intravenous Daily   multivitamin  1  tablet Per Tube QHS   oxyCODONE  10 mg Per Tube Q6H   pantoprazole (PROTONIX) IV  40 mg Intravenous Q24H   prazosin  4 mg Per Tube QHS   sodium bicarbonate  1,300 mg Per Tube BID   sodium chloride flush  10-40 mL Intracatheter Q12H   traZODone  150 mg Per Tube QHS   sodium chloride, fentaNYL, heparin, labetalol, midazolam, ondansetron **OR** ondansetron (ZOFRAN) IV, sodium chloride flush  Assessment/ Plan:  60 y.o. male with  medical problems of   Hypertension, PTSD, diabetes, migraines, GERD, neurogenic bladder secondary to remote history of transverse myelitis, requires self-catheterization and has frequent UTIs, opioids for chronic pain  admitted on 06/04/2020 for Sepsis ([place]) [A41.9] Altered mental status, unspecified altered mental status type [R41.82] Community acquired pneumonia, unspecified laterality [J18.9]   #Acute kidney injury, proteinuria, hematuria Baseline creatinine of 1.26 at admission Increasing creatinine trends since June 3. AKI is likely multifactorial secondary to ongoing sepsis as well as IV contrast exposure on June 3 Urinalysis at admission showed glucosuria, greater than 300 proteinuria, 6-10 RBCs, moderate hemoglobin noted on dipstick, 11-20 WBCs Renal imaging as noted above.  Patient had CT abdomen pelvis without contrast.  No hydronephrosis.  Bladder was unremarkable. UPC 2.55 gm RBC 21-50 cc June 09, 2020: ANA negative, ESR normal, complement C3-C4 normal, kappa lambda ratio normal, hepatitis B and hepatitis C studies negative, HIV negative, crypto antigen negative Hemodialysis started on June 6   Plan:  Patient tolerated his dialysis treatment well on 6/6.  No volume was removed Patient responded well to IV Lasix yesterday with increased urine output However concern of decreased responsiveness despite less sedation.  Question whether uremia is playing a role.  Plan to dialyze today for metabolic optimization   #Acute respiratory failure Currently  requiring ventilator support FiO2 40%,    #Dependent peripheral edema IV Lasix x1 today    LOS: [place] 6/9/20228:42 AM  [place], [place]  Note: This note was prepared with Dragon dictation. Any transcription errors are unintentional

## 2020-06-13 NOTE — Progress Notes (Signed)
Inpatient Diabetes Program Recommendations  AACE/ADA: New Consensus Statement on Inpatient Glycemic Control (2015)  Target Ranges:  Prepandial:   less than 140 mg/dL      Peak postprandial:   less than 180 mg/dL (1-2 hours)      Critically ill patients:  140 - 180 mg/dL   Lab Results  Component Value Date   GLUCAP 192 (H) 06/13/2020   HGBA1C 7.4 (H) 06/04/2020    Review of Glycemic Control Results for ERAGON, HAMMOND (MRN [id number]) as of 06/13/2020 08:58  Ref. Range 06/12/2020 15:20 06/12/2020 19:24 06/12/2020 23:13 06/13/2020 03:24 06/13/2020 07:30  Glucose-Capillary Latest Ref Range: 70 - 99 mg/dL 921 (H) 194 (H) 174 (H) 202 (H) 192 (H)    Inpatient Diabetes Program Recommendations:    Novolog 3 units Q4H tube feed coverage.  Hold if feeds discontinued or held.  Will continue to follow while inpatient.  Thank you, Dulce Sellar, RN, BSN Diabetes Coordinator Inpatient Diabetes Program [phone] (team pager from 8a-5p)

## 2020-06-13 NOTE — Progress Notes (Signed)
Patient heavily sedated throughout shift. Sedation weaned for neuro assessment. Versed titrated off at 0330. Fentanyl weaned to . Patient will grimace at this rate but does not open his eyes or follow any commands. Able to illicit cough and corneal reflexes after weaning. Temperature readings starting to climb. 750 UOP this shift, minimal output via FMS. Report to be given to Asher Muir, RN and pt care transferred.

## 2020-06-14 DIAGNOSIS — J9601 Acute respiratory failure with hypoxia: Secondary | ICD-10-CM | POA: Diagnosis not present

## 2020-06-14 DIAGNOSIS — J189 Pneumonia, unspecified organism: Secondary | ICD-10-CM | POA: Diagnosis not present

## 2020-06-14 DIAGNOSIS — G9341 Metabolic encephalopathy: Secondary | ICD-10-CM | POA: Diagnosis not present

## 2020-06-14 LAB — BASIC METABOLIC PANEL
Anion gap: 11 (ref 5–15)
BUN: 108 mg/dL — ABNORMAL HIGH (ref 8–23)
CO2: 22 mmol/L (ref 22–32)
Calcium: 7.7 mg/dL — ABNORMAL LOW (ref 8.9–10.3)
Chloride: 109 mmol/L (ref 98–111)
Creatinine, Ser: 4.67 mg/dL — ABNORMAL HIGH (ref 0.61–1.24)
GFR, Estimated: 13 mL/min — ABNORMAL LOW (ref >60)
Glucose, Bld: 222 mg/dL — ABNORMAL HIGH (ref 70–99)
Potassium: 4 mmol/L (ref 3.5–5.1)
Sodium: 142 mmol/L (ref 135–145)

## 2020-06-14 LAB — CBC WITH DIFFERENTIAL/PLATELET
Abs Immature Granulocytes: 0.1 10*3/uL — ABNORMAL HIGH (ref 0.00–0.07)
Basophils Absolute: 0 10*3/uL (ref 0.0–0.1)
Basophils Relative: 0 %
Eosinophils Absolute: 0 10*3/uL (ref 0.0–0.5)
Eosinophils Relative: 0 %
HCT: 26.1 % — ABNORMAL LOW (ref 39.0–52.0)
Hemoglobin: 8.6 g/dL — ABNORMAL LOW (ref 13.0–17.0)
Immature Granulocytes: 1 %
Lymphocytes Relative: 10 %
Lymphs Abs: 0.9 10*3/uL (ref 0.7–4.0)
MCH: 28.8 pg (ref 26.0–34.0)
MCHC: 33 g/dL (ref 30.0–36.0)
MCV: 87.3 fL (ref 80.0–100.0)
Monocytes Absolute: 0.8 10*3/uL (ref 0.1–1.0)
Monocytes Relative: 8 %
Neutro Abs: 7.8 10*3/uL — ABNORMAL HIGH (ref 1.7–7.7)
Neutrophils Relative %: 81 %
Platelets: 313 10*3/uL (ref 150–400)
RBC: 2.99 MIL/uL — ABNORMAL LOW (ref 4.22–5.81)
RDW: 15.9 % — ABNORMAL HIGH (ref 11.5–15.5)
WBC: 9.6 10*3/uL (ref 4.0–10.5)
nRBC: 0 % (ref 0.0–0.2)

## 2020-06-14 LAB — GLUCOSE, CAPILLARY
Glucose-Capillary: 199 mg/dL — ABNORMAL HIGH (ref 70–99)
Glucose-Capillary: 211 mg/dL — ABNORMAL HIGH (ref 70–99)
Glucose-Capillary: 220 mg/dL — ABNORMAL HIGH (ref 70–99)
Glucose-Capillary: 223 mg/dL — ABNORMAL HIGH (ref 70–99)
Glucose-Capillary: 254 mg/dL — ABNORMAL HIGH (ref 70–99)
Glucose-Capillary: 290 mg/dL — ABNORMAL HIGH (ref 70–99)

## 2020-06-14 LAB — TSH: TSH: 3.057 u[IU]/mL (ref 0.350–4.500)

## 2020-06-14 LAB — MAGNESIUM: Magnesium: 2.2 mg/dL (ref 1.7–2.4)

## 2020-06-14 LAB — PHOSPHORUS: Phosphorus: 5.8 mg/dL — ABNORMAL HIGH (ref 2.5–4.6)

## 2020-06-14 LAB — T4, FREE: Free T4: 0.64 ng/dL (ref 0.61–1.12)

## 2020-06-14 LAB — ALBUMIN: Albumin: 1.9 g/dL — ABNORMAL LOW (ref 3.5–5.0)

## 2020-06-14 MED ORDER — FUROSEMIDE 10 MG/ML IJ SOLN
40.0000 mg | Freq: Once | INTRAMUSCULAR | Status: AC
  Administered 2020-06-14: 40 mg via INTRAVENOUS
  Filled 2020-06-14: qty 4

## 2020-06-14 MED ORDER — MIDAZOLAM HCL 2 MG/2ML IJ SOLN
2.0000 mg | INTRAMUSCULAR | Status: AC
  Administered 2020-06-14: 2 mg via INTRAVENOUS
  Filled 2020-06-14: qty 2

## 2020-06-14 MED ORDER — HYDRALAZINE HCL 20 MG/ML IJ SOLN
10.0000 mg | Freq: Once | INTRAMUSCULAR | Status: AC
  Administered 2020-06-14: 10 mg via INTRAVENOUS

## 2020-06-14 MED ORDER — CLONIDINE HCL 0.2 MG/24HR TD PTWK
0.2000 mg | MEDICATED_PATCH | TRANSDERMAL | Status: DC
  Administered 2020-06-14 – 2020-06-21 (×2): 0.2 mg via TRANSDERMAL
  Filled 2020-06-14 (×2): qty 1

## 2020-06-14 MED ORDER — HYDRALAZINE HCL 20 MG/ML IJ SOLN
INTRAMUSCULAR | Status: AC
  Filled 2020-06-14: qty 1

## 2020-06-14 MED ORDER — MIDAZOLAM HCL 2 MG/2ML IJ SOLN
2.0000 mg | Freq: Once | INTRAMUSCULAR | Status: AC
  Administered 2020-06-14: 2 mg via INTRAVENOUS
  Filled 2020-06-14: qty 2

## 2020-06-14 NOTE — Progress Notes (Signed)
Good day. Sedation off x 5 hours before patient responds to commands slowly and tries to open eyes. Very weak with hand grips. Brother and friend in for wakeup. Re sedated at 1300. Output respond to Lasix was 1600 ml of urine.

## 2020-06-14 NOTE — Progress Notes (Signed)
NAME:  Wyatt Ball., MRN:  [id number], DOB:  October 25, 1960, LOS: 27 ADMISSION DATE:  06/04/2020 60 y.o. male with medical history significant for HTN, PTSD, diabetes, migraines, GERD, neurogenic bladder secondary to remote history of transverse myelitis, who self catheterizes and has frequent UTIs as well as chronic back pain on chronic opiates who at baseline is independent - brought to the ER with a 3-day history of altered mental status.   + cough, shortness of breath and fever as well as vomiting and diarrhea and started becoming increasingly confused in the 24 hours prior to arrival.       Unable to get any meaningful history from patient.   ED course: On arrival, temperature 99.3, BP 167/67, pulse 99, respirations 18 with O2 sat 96% on room air.  Blood work significant for leukocytosis of 13,600 with lactic acid of 1.2.  Ammonia level normal at 15, EtOH less than 10.  Sodium 130, potassium 3.2, glucose 249 creatinine 1.06.  Urinalysis with 80 ketones and rare bacteria.  UDS positive for opiates and tricyclic's.  COVID and flu negative       Imaging:   CT chest abdomen and pelvis without contrast consistent with pneumonia.  Multiple nodules left upper lobe likely infectious or inflammatory with follow-up imaging recommended to document resolution.   Cardiomegaly with trace pericardial effusion   Patient was started on sepsis fluids and broad-spectrum antibiotics initially for sepsis of unknown source.       Significant Hospital Events: Including procedures, antibiotic start and stop dates in addition to other pertinent events   5/31 admitted for sepsis present on admission for Left lung pneumonia 6/1 progressive confusion and hypoxia 6/2 transferred to ICU, INTUBATED, S/P BRONCH 6/3 high fevers, remains ill, SELF EXTUBATED 6/3 emergently re-intubated 6/5 failure to wean from v ent 6/6 failure to wean from vent, VASC CATH PLACED 6/7 HD started remains critically ill 6/8  remains on vent 6/9 waiting for family for SAT 6/10 required more sedation overnight, family updated   Interim History / Subjective:  Multiorgan failure Remains critically ill +diastolic dysfunction Plan for precedex and weaning trials  Await for family No indication fr HD per nephrol, UO good > Lasix   CBC    Component Value Date/Time   WBC 9.6 06/14/2020 0304   RBC 2.99 (L) 06/14/2020 0304   HGB 8.6 (L) 06/14/2020 0304   HCT 26.1 (L) 06/14/2020 0304   PLT 313 06/14/2020 0304   MCV 87.3 06/14/2020 0304   MCH 28.8 06/14/2020 0304   MCHC 33.0 06/14/2020 0304   RDW 15.9 (H) 06/14/2020 0304   LYMPHSABS 0.9 06/14/2020 0304   MONOABS 0.8 06/14/2020 0304   EOSABS 0.0 06/14/2020 0304   BASOSABS 0.0 06/14/2020 0304     BMP Latest Ref Rng & Units 06/14/2020 06/13/2020 06/12/2020  Glucose 70 - 99 mg/dL 222(H) 188(H) 214(H)  BUN 8 - 23 mg/dL 108(H) 119(H) 112(H)  Creatinine 0.61 - 1.24 mg/dL 4.67(H) 5.71(H) 6.02(H)  Sodium 135 - 145 mmol/L 142 142 142  Potassium 3.5 - 5.1 mmol/L 4.0 3.7 3.6  Chloride 98 - 111 mmol/L 109 113(H) 111  CO2 22 - 32 mmol/L 22 20(L) 22  Calcium 8.9 - 10.3 mg/dL 7.7(L) 7.5(L) 8.0(L)       Objective   Blood pressure (!) 162/70, pulse 80, temperature 98.24 F (36.8 C), temperature source Esophageal, resp. rate 19, height _0  (1.676 m), weight 103.1 kg, SpO2 95 %.    Vent Mode: PRVC FiO2 (%):  [  30 %] 30 % Set Rate:  [16 bmp] 16 bmp Vt Set:  [520 mL] 520 mL PEEP:  [5 cmH20] 5 cmH20   Intake/Output Summary (Last 24 hours) at 06/14/2020 2224 Last data filed at 06/14/2020 2052 Gross per 24 hour  Intake 4036.88 ml  Output 3385 ml  Net 651.88 ml    Filed Weights   06/12/20 0429 06/13/20 0412 06/14/20 0326  Weight: 99.4 kg 102.1 kg 103.1 kg   Scheduled Meds:  budesonide (PULMICORT) nebulizer solution  0.5 mg Nebulization BID   chlorhexidine gluconate (MEDLINE KIT)  15 mL Mouth Rinse BID   Chlorhexidine Gluconate Cloth  6 each Topical Daily    cloNIDine  0.2 mg Transdermal Weekly   diazepam  5 mg Per Tube Q6H   feeding supplement (PROSource TF)  45 mL Per Tube Daily   free water  100 mL Per Tube Q4H   heparin injection (subcutaneous)  5,000 Units Subcutaneous Q8H   insulin aspart  0-20 Units Subcutaneous Q4H   insulin glargine  10 Units Subcutaneous Daily   ipratropium-albuterol  3 mL Nebulization Q4H   mouth rinse  15 mL Mouth Rinse 10 times per day   methylPREDNISolone (SOLU-MEDROL) injection  20 mg Intravenous Daily   multivitamin  1 tablet Per Tube QHS   oxyCODONE  10 mg Per Tube Q6H   pantoprazole (PROTONIX) IV  40 mg Intravenous Q24H   sodium bicarbonate  1,300 mg Per Tube BID   sodium chloride flush  10-40 mL Intracatheter Q12H   traZODone  150 mg Per Tube QHS   Continuous Infusions:  sodium chloride 10 mL/hr at 06/14/20 2052   dexmedetomidine (PRECEDEX) IV infusion 0.8 mcg/kg/hr (06/14/20 2052)   doxycycline (VIBRAMYCIN) IV 100 mg (06/14/20 2154)   feeding supplement (VITAL HIGH PROTEIN) 1,000 mL (06/13/20 6433)   fentaNYL infusion INTRAVENOUS 200 mcg/hr (06/14/20 2052)   levofloxacin (LEVAQUIN) IV Stopped (06/14/20 1821)   midazolam Stopped (06/13/20 0403)   PRN Meds:.sodium chloride, fentaNYL, heparin, labetalol, midazolam, ondansetron **OR** ondansetron (ZOFRAN) IV, sodium chloride flush    REVIEW OF SYSTEMS  PATIENT IS UNABLE TO PROVIDE COMPLETE REVIEW OF SYSTEMS DUE TO SEVERE CRITICAL ILLNESS AND TOXIC METABOLIC ENCEPHALOPATHY   PHYSICAL EXAMINATION:  GENERAL:critically ill appearing,intubated, sedated HEAD: Normocephalic, atraumatic.  EYES: Pupils equal, round, reactive to light.  No scleral icterus.  MOUTH: ETT, OG in place NECK: Supple. Trachea midline PULMONARY: +rhonchi, no wheezing, even CARDIOVASCULAR: S1 and S2. Regular rate and rhythm. No murmurs, rubs, or gallops.  GASTROINTESTINAL: Soft, non-distended. Positive bowel sounds.  MUSCULOSKELETAL: No swelling, clubbing, or edema.   NEUROLOGIC: sedated SKIN:intact,warm,dry   ASSESSMENT AND PLAN SYNOPSIS  60 yo white make with acute and severe hypoxic respiratory failure with acute and severe toxic metabolic encephalopathy with acute COPD Exacerbation LUL/LL multifocal pneumonia Self extubated and re intubated emergently, now with progressive renal failure    ACUTE Hypoxic and Hypercapnic Respiratory Failure -continue Mechanical Ventilator support -continue Bronchodilator Therapy -Wean Fio2 and PEEP as tolerated -Continue VAP/VENT bundle  -will perform SAT/SBT when respiratory parameters are met -required extra sedation overnight   SEVERE COPD EXACERBATION -continue IV steroids  -continue NEB THERAPY    ACUTE KIDNEY INJURY/Renal Failure -continue Foley Catheter-assess need -Avoid nephrotoxic agents -Follow urine output, BMP -Ensure adequate renal perfusion, optimize oxygenation -Renal dose medications -No HD today   Acute toxic metabolic encephalopathy Goal RASS -2 to -3 Precedex   SEPTIC SHOCK -resolved -use vasopressors to keep MAP>65 as needed -follow ABG and LA -follow up  cultures -continueABX  INFECTIOUS DISEASE -continue antibiotics as prescribed -follow up cultures -follow up ID recs  MRSA NEG RVP NEGATIVE COVID NEG INF AB NEG SPUTUM CX NG AFB SMEAR NEG HIV NEG  ENDO - ICU hypoglycemic\Hyperglycemia protocol -check FSBS per protocol   GI GI PROPHYLAXIS as indicated  NUTRITIONAL STATUS DIET-->TF's as tolerated Constipation protocol as indicated   ELECTROLYTES -follow labs as needed -replace as needed -pharmacy consultation and following  MAR reviewed with clinical pharmacist   Best practice (right click and "Reselect all SmartList Selections" daily)  Diet:  NPO Pain/Anxiety/Delirium protocol (if indicated): Yes (RASS goal -2) VAP protocol (if indicated): Yes DVT prophylaxis: Subcutaneous Heparin GI prophylaxis: H2B Glucose control:  SSI Yes Central  venous access:  N/A Arterial line:  N/A Foley:  N/A Mobility:  bed rest  PT consulted: N/A Code Status:  full code Disposition: ICU   Labs   CBC: Recent Labs  Lab 06/10/20 0450 06/11/20 0449 06/12/20 0435 06/13/20 0438 06/14/20 0304  WBC 6.8 10.1 7.5 6.4 9.6  NEUTROABS  --  9.1* 5.7  --  7.8*  HGB 10.5* 10.1* 9.6* 8.7* 8.6*  HCT 31.7* 29.4* 28.8* 26.2* 26.1*  MCV 89.5 85.7 87.8 88.8 87.3  PLT 134* 167 205 232 313     Basic Metabolic Panel: Recent Labs  Lab 06/09/20 0511 06/10/20 0450 06/11/20 0449 06/12/20 0435 06/13/20 0438 06/14/20 0304  NA 142 141 142 142 142 142  K 3.2* 3.3* 3.8 3.6 3.7 4.0  CL 112* 111 108 111 113* 109  CO2 19* 18* 22 22 20* 22  GLUCOSE 174* 154* 255* 214* 188* 222*  BUN 61* 81* 86* 112* 119* 108*  CREATININE 3.82* 5.47* 4.98* 6.02* 5.71* 4.67*  CALCIUM 8.3* 8.0* 8.2* 8.0* 7.5* 7.7*  MG 2.5* 2.6* 2.6*  --  2.5* 2.2  PHOS 5.9* 7.7* 7.5*  --  7.3* 5.8*    GFR: Estimated Creatinine Clearance: 18.7 mL/min (A) (by C-G formula based on SCr of 4.67 mg/dL (H)). Recent Labs  Lab 06/11/20 0449 06/12/20 0435 06/13/20 0438 06/14/20 0304  WBC 10.1 7.5 6.4 9.6     Liver Function Tests: Recent Labs  Lab 06/09/20 0511 06/10/20 0450 06/11/20 0449 06/14/20 0304  AST 80*  --   --   --   ALT 68*  --   --   --   ALKPHOS 93  --   --   --   BILITOT 0.9  --   --   --   PROT 4.8*  --   --   --   ALBUMIN 1.7* 1.6* 2.4* 1.9*    No results for input(s): LIPASE, AMYLASE in the last 168 hours. No results for input(s): AMMONIA in the last 168 hours.  ABG    Component Value Date/Time   PHART 7.33 (L) 06/08/2020 0500   PCO2ART 40 06/08/2020 0500   PO2ART 99 06/08/2020 0500   HCO3 21.1 06/08/2020 0500   ACIDBASEDEF 4.5 (H) 06/08/2020 0500   O2SAT 97.2 06/08/2020 0500      Coagulation Profile: No results for input(s): INR, PROTIME in the last 168 hours.  Cardiac Enzymes: Recent Labs  Lab 06/09/20 0901  CKTOTAL 506*      HbA1C: Hgb A1c MFr Bld  Date/Time Value Ref Range Status  06/04/2020 09:48 PM 7.4 (H) 4.8 - 5.6 % Final    Comment:    (NOTE)         Prediabetes: 5.7 - 6.4  Diabetes: >6.4         Glycemic control for adults with diabetes: <7.0     CBG: Recent Labs  Lab 06/14/20 0306 06/14/20 0752 06/14/20 1112 06/14/20 1616 06/14/20 2001  GLUCAP 211* 223* 199* 220* 290*     Allergies No Known Allergies    CASE DISCUSSED IN MULTIDISCIPLINARY ROUNDS WITH ICU TEAM  I have updated the family at bedside.    The patient is critically ill with multiple organ systems failure and requires high complexity decision making for assessment and support, frequent evaluation and titration of therapies, application of advanced monitoring technologies and extensive interpretation of multiple databases. Critical Care Time devoted to patient care services described in this note is 40 minutes.  Renold Don, MD [facility]   *This note was dictated using voice recognition software/Dragon.  Despite best efforts to proofread, errors can occur which can change the meaning.  Any change was purely unintentional.

## 2020-06-14 NOTE — Progress Notes (Signed)
[place], [place] 06/14/20  Subjective:   Hospital day # 10  No acute events No sedation today but did not wake up to voice or tactile stimuli Good response to IV Lasix yesterday.  Overall urine output 2800 cc.  Underwent dialysis on June 9 Continues to be ventilator dependent.  FiO2 30%  Tube feeds going at 25 cc/h   Renal: 06/09 0701 - 06/10 0700 In: 1662.3 [I.V.:1062.3; IV Piggyback:500] Out: 3546 [Urine:2855; Stool:300] Lab Results  Component Value Date   CREATININE 4.67 (H) 06/14/2020   CREATININE 5.71 (H) 06/13/2020   CREATININE 6.02 (H) 06/12/2020   Serum creatinine slightly improved compared to yesterday, however this is postdialysis  Objective:  Vital signs in last 24 hours:  Temp:  [98.6 F (37 C)-100.76 F (38.2 C)] 99.9 F (37.7 C) (06/10 0737) Pulse Rate:  [84-114] 93 (06/10 0800) Resp:  [19-27] 20 (06/10 0800) BP: (141-198)/(67-87) 190/72 (06/10 0737) SpO2:  [90 %-96 %] 93 % (06/10 0800) FiO2 (%):  [30 %] 30 % (06/10 0800) Weight:  [103.1 kg] 103.1 kg (06/10 0326)  Weight change: 1 kg Filed Weights   06/12/20 0429 06/13/20 0412 06/14/20 0326  Weight: 99.4 kg 102.1 kg 103.1 kg    Intake/Output:    Intake/Output Summary (Last 24 hours) at 06/14/2020 0906 Last data filed at 06/14/2020 5681 Gross per 24 hour  Intake 1532.05 ml  Output 2865 ml  Net -1332.95 ml     Physical Exam:  General: No acute distress, laying in the bed, critically ill-appearing  HEENT anicteric,  ETT, OG tube  Pulm/lungs coarse breath sounds, ventilator assisted  CVS/Heart regular rhythm, tachycardic  Abdomen:  Soft, nontender  Extremities: + dependent peripheral edema  Neurologic: Sedated  Skin:  Warm, dry  Coud catheter in place  Rectal tube in place, with liquidy stools   Basic Metabolic Panel:  Recent Labs  Lab 06/09/20 0511 06/10/20 0450 06/11/20 0449 06/12/20 0435 06/13/20 0438 06/14/20 0304  NA 142 141 142 142 142 142   K 3.2* 3.3* 3.8 3.6 3.7 4.0  CL 112* 111 108 111 113* 109  CO2 19* 18* 22 22 20* 22  GLUCOSE 174* 154* 255* 214* 188* 222*  BUN 61* 81* 86* 112* 119* 108*  CREATININE 3.82* 5.47* 4.98* 6.02* 5.71* 4.67*  CALCIUM 8.3* 8.0* 8.2* 8.0* 7.5* 7.7*  MG 2.5* 2.6* 2.6*  --  2.5* 2.2  PHOS 5.9* 7.7* 7.5*  --  7.3* 5.8*      CBC: Recent Labs  Lab 06/10/20 0450 06/11/20 0449 06/12/20 0435 06/13/20 0438 06/14/20 0304  WBC 6.8 10.1 7.5 6.4 9.6  NEUTROABS  --  9.1* 5.7  --  7.8*  HGB 10.5* 10.1* 9.6* 8.7* 8.6*  HCT 31.7* 29.4* 28.8* 26.2* 26.1*  MCV 89.5 85.7 87.8 88.8 87.3  PLT 134* 167 205 232 313       Lab Results  Component Value Date   HEPBSAG NON REACTIVE 06/09/2020   HEPBSAB NON REACTIVE 06/09/2020      Microbiology:  Recent Results (from the past 240 hour(s))  Blood culture (single)     Status: Abnormal   Collection Time: 06/04/20  5:18 PM   Specimen: BLOOD  Result Value Ref Range Status   Specimen Description   Final    BLOOD BLOOD RIGHT HAND Performed at [facility], [address] [place], [place] [address]    Special Requests   Final    BOTTLES DRAWN AEROBIC AND ANAEROBIC Blood Culture adequate  volume Performed at [facility], [address] [place], [place] [address]    Culture  Setup Time   Final    GRAM POSITIVE COCCI IN BOTH AEROBIC AND ANAEROBIC BOTTLES CRITICAL RESULT CALLED TO, READ BACK BY AND VERIFIED WITH: Elvera Maria [id number] 06/06/2020 DLB Performed at [facility], [address] [place], [place] [address]    Culture (A)  Final    STAPHYLOCOCCUS EPIDERMIDIS THE SIGNIFICANCE OF ISOLATING THIS ORGANISM FROM A SINGLE SET OF BLOOD CULTURES WHEN MULTIPLE SETS ARE DRAWN IS UNCERTAIN. PLEASE NOTIFY THE MICROBIOLOGY DEPARTMENT WITHIN ONE WEEK IF SPECIATION AND SENSITIVITIES ARE REQUIRED. MICROCOCCUS SPECIES Standardized susceptibility testing for this organism is not available. KOCURIA RHIZOPHILIA Performed at [facility], [place] [address] [place], [place] [address]    Report Status 06/10/2020 FINAL  Final  Resp Panel by RT-PCR (Flu A&B, Covid) Nasopharyngeal Swab     Status: None   Collection Time: 06/04/20  5:18 PM   Specimen: Nasopharyngeal Swab; Nasopharyngeal(NP) swabs in vial transport medium  Result Value Ref Range Status   SARS Coronavirus 2 by RT PCR NEGATIVE NEGATIVE Final    Comment: (NOTE) SARS-CoV-2 target nucleic acids are NOT DETECTED.  The SARS-CoV-2 RNA is generally detectable in upper respiratory specimens during the acute phase of infection. The lowest concentration of SARS-CoV-2 viral copies this assay can detect is 138 copies/mL. A negative result does not preclude SARS-Cov-2 infection and should not be used as the sole basis for treatment or other patient management decisions. A negative result may occur with  improper specimen collection/handling, submission of specimen other than nasopharyngeal swab, presence of viral mutation(s) within the areas targeted by this assay, and inadequate number of viral copies(<138 copies/mL). A negative result must be combined with clinical observations, patient history, and epidemiological information. The expected result is Negative.  Fact Sheet for Patients:  EntrepreneurPulse.com.au  Fact Sheet for Healthcare Providers:  IncredibleEmployment.be  This test is no t yet approved or cleared by the [place] FDA and  has been authorized for detection and/or diagnosis of SARS-CoV-2 by FDA under an Emergency Use Authorization (EUA). This EUA will remain  in effect (meaning this test can be used) for the duration of the COVID-19 declaration under Section 564(b)(1) of the Act, 21 U.S.C.section 360bbb-3(b)(1), unless the authorization is terminated  or revoked sooner.       Influenza A by PCR NEGATIVE NEGATIVE Final   Influenza B by PCR NEGATIVE NEGATIVE Final    Comment: (NOTE) The Xpert  Xpress SARS-CoV-2/FLU/RSV plus assay is intended as an aid in the diagnosis of influenza from Nasopharyngeal swab specimens and should not be used as a sole basis for treatment. Nasal washings and aspirates are unacceptable for Xpert Xpress SARS-CoV-2/FLU/RSV testing.  Fact Sheet for Patients: EntrepreneurPulse.com.au  Fact Sheet for Healthcare Providers: IncredibleEmployment.be  This test is not yet approved or cleared by the [place] FDA and has been authorized for detection and/or diagnosis of SARS-CoV-2 by FDA under an Emergency Use Authorization (EUA). This EUA will remain in effect (meaning this test can be used) for the duration of the COVID-19 declaration under Section 564(b)(1) of the Act, 21 U.S.C. section 360bbb-3(b)(1), unless the authorization is terminated or revoked.  Performed at [facility], [address] [place], [place] [address]   Blood Culture ID Panel (Reflexed)     Status: Abnormal   Collection Time: 06/04/20  5:18 PM  Result Value Ref Range Status   Enterococcus faecalis NOT  DETECTED NOT DETECTED Final   Enterococcus Faecium NOT DETECTED NOT DETECTED Final   Listeria monocytogenes NOT DETECTED NOT DETECTED Final   Staphylococcus species DETECTED (A) NOT DETECTED Final    Comment: CRITICAL RESULT CALLED TO, READ BACK BY AND VERIFIED WITH: [facility] MITCHELL 2202 06/06/2020 DLB    Staphylococcus aureus (BCID) NOT DETECTED NOT DETECTED Final   Staphylococcus epidermidis DETECTED (A) NOT DETECTED Final    Comment: CRITICAL RESULT CALLED TO, READ BACK BY AND VERIFIED WITH: Elvera Maria [id number] 06/06/2020 DLB    Staphylococcus lugdunensis NOT DETECTED NOT DETECTED Final   Streptococcus species NOT DETECTED NOT DETECTED Final   Streptococcus agalactiae NOT DETECTED NOT DETECTED Final   Streptococcus pneumoniae NOT DETECTED NOT DETECTED Final   Streptococcus pyogenes NOT DETECTED NOT DETECTED Final    A.calcoaceticus-baumannii NOT DETECTED NOT DETECTED Final   Bacteroides fragilis NOT DETECTED NOT DETECTED Final   Enterobacterales NOT DETECTED NOT DETECTED Final   Enterobacter cloacae complex NOT DETECTED NOT DETECTED Final   Escherichia coli NOT DETECTED NOT DETECTED Final   Klebsiella aerogenes NOT DETECTED NOT DETECTED Final   Klebsiella oxytoca NOT DETECTED NOT DETECTED Final   Klebsiella pneumoniae NOT DETECTED NOT DETECTED Final   Proteus species NOT DETECTED NOT DETECTED Final   Salmonella species NOT DETECTED NOT DETECTED Final   Serratia marcescens NOT DETECTED NOT DETECTED Final   Haemophilus influenzae NOT DETECTED NOT DETECTED Final   Neisseria meningitidis NOT DETECTED NOT DETECTED Final   Pseudomonas aeruginosa NOT DETECTED NOT DETECTED Final   Stenotrophomonas maltophilia NOT DETECTED NOT DETECTED Final   Candida albicans NOT DETECTED NOT DETECTED Final   Candida auris NOT DETECTED NOT DETECTED Final   Candida glabrata NOT DETECTED NOT DETECTED Final   Candida krusei NOT DETECTED NOT DETECTED Final   Candida parapsilosis NOT DETECTED NOT DETECTED Final   Candida tropicalis NOT DETECTED NOT DETECTED Final   Cryptococcus neoformans/gattii NOT DETECTED NOT DETECTED Final   Methicillin resistance mecA/C NOT DETECTED NOT DETECTED Final    Comment: Performed at [facility], [address] [place], [place] [address]  Blood culture (single)     Status: None   Collection Time: 06/04/20  9:48 PM   Specimen: BLOOD  Result Value Ref Range Status   Specimen Description BLOOD BLOOD RIGHT ARM  Final   Special Requests   Final    BOTTLES DRAWN AEROBIC AND ANAEROBIC Blood Culture adequate volume   Culture   Final    NO GROWTH 5 DAYS Performed at [facility], [address], [place], [place] [address]    Report Status 06/09/2020 FINAL  Final  Urine Culture     Status: None   Collection Time: 06/06/20  2:37 PM   Specimen: Urine, Random  Result Value Ref  Range Status   Specimen Description   Final    URINE, RANDOM Performed at [facility], [address], [place], [place] [address]    Special Requests   Final    NONE Performed at [facility], [address] [place], [place] [address]    Culture   Final    NO GROWTH Performed at [facility], [address], [place], [place] [address]    Report Status 06/08/2020 FINAL  Final  MRSA PCR Screening     Status: None   Collection Time: 06/06/20  2:40 PM   Specimen: Nasopharyngeal  Result Value Ref Range Status   MRSA by PCR NEGATIVE NEGATIVE Final  Comment:        The GeneXpert MRSA Assay (FDA approved for NASAL specimens only), is one component of a comprehensive MRSA colonization surveillance program. It is not intended to diagnose MRSA infection nor to guide or monitor treatment for MRSA infections. Performed at [facility], [place], [place] [address]   Acid Fast Smear (AFB)     Status: None   Collection Time: 06/06/20  6:30 PM   Specimen: Bronchoalveolar Lavage; Sputum  Result Value Ref Range Status   AFB Specimen Processing Concentration  Final   Acid Fast Smear Negative  Final    Comment: (NOTE) Performed At: [facility] [place], [place] [id number] Rush Farmer MD [id number]    Source (AFB) BRONCHIAL ALVEOLAR LAVAGE  Corrected    Comment: Performed at [facility], [address] [place],  [place] [address] CORRECTED ON 06/02 AT 2239: PREVIOUSLY REPORTED AS SPUTUM   Culture, Respiratory w Gram Stain     Status: None   Collection Time: 06/06/20  6:30 PM   Specimen: Bronchoalveolar Lavage  Result Value Ref Range Status   Specimen Description   Final    BRONCHIAL ALVEOLAR LAVAGE Performed at [facility], [address], [place], [place] [address]    Special Requests   Final    NONE Performed at [facility], [address] [place], [place]  [address]    Gram Stain   Final    FEW WBC PRESENT,BOTH PMN AND MONONUCLEAR NO ORGANISMS SEEN    Culture   Final    NO GROWTH 3 DAYS Performed at [facility], [place] [address] [place], [place] [address]    Report Status 06/09/2020 FINAL  Final  Fungus Culture With Stain     Status: None (Preliminary result)   Collection Time: 06/06/20  6:30 PM  Result Value Ref Range Status   Fungus Stain Final report  Final    Comment: (NOTE) Performed At: [facility] [place], [place] [id number] Rush Farmer MD QP:[id number]    Fungus (Mycology) Culture PENDING  Incomplete   Fungal Source BRONCHIAL ALVEOLAR LAVAGE  Final    Comment: Performed at [facility], [place] [address], [place], [place] [address]  Fungus Culture Result     Status: None   Collection Time: 06/06/20  6:30 PM  Result Value Ref Range Status   Result 1 Comment  Final    Comment: (NOTE) KOH/Calcofluor preparation:  no fungus observed. Performed At: [facility] [place], [place] [id number] Rush Farmer MD [id number]   Respiratory (~20 pathogens) panel by PCR     Status: None   Collection Time: 06/07/20  5:50 AM   Specimen: Nasopharyngeal Swab; Respiratory  Result Value Ref Range Status   Adenovirus NOT DETECTED NOT DETECTED Final   Coronavirus 229E NOT DETECTED NOT DETECTED Final    Comment: (NOTE) The Coronavirus on the Respiratory Panel, DOES NOT test for the novel  Coronavirus (2019 nCoV)    Coronavirus HKU1 NOT DETECTED NOT DETECTED Final   Coronavirus NL63 NOT DETECTED NOT DETECTED Final   Coronavirus OC43 NOT DETECTED NOT DETECTED Final   Metapneumovirus NOT DETECTED NOT DETECTED Final   Rhinovirus / Enterovirus NOT DETECTED NOT DETECTED Final   Influenza A NOT DETECTED NOT DETECTED Final   Influenza B NOT DETECTED NOT DETECTED Final   Parainfluenza Virus 1 NOT DETECTED NOT DETECTED Final   Parainfluenza Virus 2 NOT DETECTED NOT DETECTED  Final    Parainfluenza Virus 3 NOT DETECTED NOT DETECTED Final   Parainfluenza Virus 4 NOT DETECTED NOT DETECTED Final   Respiratory Syncytial Virus NOT DETECTED NOT DETECTED Final   Bordetella pertussis NOT DETECTED NOT DETECTED Final   Bordetella Parapertussis NOT DETECTED NOT DETECTED Final   Chlamydophila pneumoniae NOT DETECTED NOT DETECTED Final   Mycoplasma pneumoniae NOT DETECTED NOT DETECTED Final    Comment: Performed at [facility], [place] [address], [place], [place] [address]    Coagulation Studies: No results for input(s): LABPROT, INR in the last 72 hours.  Urinalysis: No results for input(s): COLORURINE, LABSPEC, PHURINE, GLUCOSEU, HGBUR, BILIRUBINUR, KETONESUR, PROTEINUR, UROBILINOGEN, NITRITE, LEUKOCYTESUR in the last 72 hours.  Invalid input(s): APPERANCEUR    Imaging: DG Chest Port 1 View  Result Date: 06/13/2020 CLINICAL DATA:  Acute respiratory failure with hypoxia. EXAM: PORTABLE CHEST 1 VIEW COMPARISON:  June 10, 2020 FINDINGS: Increased pulmonary interstitium is identified in bilateral lungs consistent with pulmonary edema. There is interval better aeration of the left upper lobe. Persistent consolidation of the left mid and lung base is noted. Endotracheal tube is identified with distal tip 5.4 cm from carina. Nasogastric tube is identified with distal tip not included on film but is at least in the stomach. Right PICC line is identified distal tip in the superior vena cava. IMPRESSION: 1. Pulmonary edema. 2. Improved aeration of the left upper lobe. Persistent consolidation of the left mid and lung base. Electronically Signed   By: Abelardo Diesel M.D.   On: 06/13/2020 21:27      Medications:    sodium chloride 10 mL/hr at 06/14/20 0613   dexmedetomidine (PRECEDEX) IV infusion Stopped (06/14/20 0757)   doxycycline (VIBRAMYCIN) IV Stopped (06/13/20 2347)   feeding supplement (VITAL HIGH PROTEIN) 1,000 mL (06/13/20 6734)   fentaNYL infusion INTRAVENOUS Stopped  (06/14/20 0756)   levofloxacin (LEVAQUIN) IV     midazolam Stopped (06/13/20 0403)    budesonide (PULMICORT) nebulizer solution  0.5 mg Nebulization BID   chlorhexidine gluconate (MEDLINE KIT)  15 mL Mouth Rinse BID   Chlorhexidine Gluconate Cloth  6 each Topical Daily   cloNIDine  0.2 mg Transdermal Weekly   diazepam  5 mg Per Tube Q6H   feeding supplement (PROSource TF)  45 mL Per Tube Daily   free water  100 mL Per Tube Q4H   furosemide  40 mg Intravenous Once   heparin injection (subcutaneous)  5,000 Units Subcutaneous Q8H   hydrALAZINE       insulin aspart  0-20 Units Subcutaneous Q4H   insulin glargine  10 Units Subcutaneous Daily   ipratropium-albuterol  3 mL Nebulization Q4H   mouth rinse  15 mL Mouth Rinse 10 times per day   methylPREDNISolone (SOLU-MEDROL) injection  20 mg Intravenous Daily   multivitamin  1 tablet Per Tube QHS   oxyCODONE  10 mg Per Tube Q6H   pantoprazole (PROTONIX) IV  40 mg Intravenous Q24H   sodium bicarbonate  1,300 mg Per Tube BID   sodium chloride flush  10-40 mL Intracatheter Q12H   traZODone  150 mg Per Tube QHS   sodium chloride, fentaNYL, heparin, labetalol, midazolam, ondansetron **OR** ondansetron (ZOFRAN) IV, sodium chloride flush  Assessment/ Plan:  60 y.o. male with  medical problems of   Hypertension, PTSD, diabetes, migraines, GERD, neurogenic bladder secondary to remote history of transverse myelitis, requires self-catheterization and has frequent UTIs, opioids for chronic pain  admitted on 06/04/2020 for Sepsis ([place]) [A41.9]  Altered mental status, unspecified altered mental status type [R41.82] Community acquired pneumonia, unspecified laterality [J18.9]   #Acute kidney injury, proteinuria, hematuria Baseline creatinine of 1.26 at admission Increasing creatinine trends since June 3. AKI is likely multifactorial secondary to ongoing sepsis as well as IV contrast exposure on June 3 Urinalysis at admission showed glucosuria, greater  than 300 proteinuria, 6-10 RBCs, moderate hemoglobin noted on dipstick, 11-20 WBCs Renal imaging as noted above.  Patient had CT abdomen pelvis without contrast.  No hydronephrosis.  Bladder was unremarkable. UPC 2.55 gm RBC 21-50 cc June 09, 2020: ANA negative, ESR normal, complement C3-C4 normal, kappa lambda ratio normal, hepatitis B and hepatitis C studies negative, HIV negative, crypto antigen negative Hemodialysis started on June 6   Plan: Patient tolerated his dialysis treatment well on 6/6 and June 9.  No volume was removed Continue IV Lasix as needed.  40 mg IV x1 dose today Underwent hemodialysis on 6/9; mental status has not improved   #Acute respiratory failure Currently requiring ventilator support FiO2 30%,    #Dependent peripheral edema IV Lasix x1 today    LOS: [place] 6/10/20229:06 AM  [place], [place]  Note: This note was prepared with Dragon dictation. Any transcription errors are unintentional

## 2020-06-14 NOTE — Consult Note (Signed)
PHARMACY CONSULT NOTE  Pharmacy Consult for Electrolyte Monitoring and Replacement   Recent Labs: Potassium (mmol/L)  Date Value  06/14/2020 4.0   Magnesium (mg/dL)  Date Value  80/88/1103 2.2   Calcium (mg/dL)  Date Value  15/94/5859 7.7 (L)   Albumin (g/dL)  Date Value  29/24/4628 1.9 (L)   Phosphorus (mg/dL)  Date Value  63/81/7711 5.8 (H)   Sodium (mmol/L)  Date Value  06/14/2020 142   Assessment: Patient is a 60 y/o M with medical history including diabetes, migraines, GERD, neurogenic bladder, HTN, chronic opioid use who is admitted with sepsis secondary to pneumonia. Patient is admitted to the ICU and is currently intubated, sedated, and on mechanical ventilation.   Pharmacy consulted to assist with electrolyte monitoring and replacement as indicated. Patient received first HD treatment 6/6. Planning to assess daily for HD needs. Patient is receiving intermittent diuresis.  Nutrition: tube feeds + free water flushes 100 mL q4h  Goal of Therapy:  Electrolytes within normal limits  Plan:  --No replacement indicated today --Monitor electrolytes with AM labs  Pricilla Riffle, PharmD 06/14/2020 1:48 PM

## 2020-06-15 ENCOUNTER — Inpatient Hospital Stay: Payer: No Typology Code available for payment source

## 2020-06-15 DIAGNOSIS — J189 Pneumonia, unspecified organism: Secondary | ICD-10-CM | POA: Diagnosis not present

## 2020-06-15 DIAGNOSIS — G9341 Metabolic encephalopathy: Secondary | ICD-10-CM | POA: Diagnosis not present

## 2020-06-15 DIAGNOSIS — J9601 Acute respiratory failure with hypoxia: Secondary | ICD-10-CM | POA: Diagnosis not present

## 2020-06-15 LAB — CBC WITH DIFFERENTIAL/PLATELET
Abs Immature Granulocytes: 0.07 10*3/uL (ref 0.00–0.07)
Basophils Absolute: 0 10*3/uL (ref 0.0–0.1)
Basophils Relative: 0 %
Eosinophils Absolute: 0 10*3/uL (ref 0.0–0.5)
Eosinophils Relative: 0 %
HCT: 25.4 % — ABNORMAL LOW (ref 39.0–52.0)
Hemoglobin: 8.3 g/dL — ABNORMAL LOW (ref 13.0–17.0)
Immature Granulocytes: 1 %
Lymphocytes Relative: 13 %
Lymphs Abs: 1.1 10*3/uL (ref 0.7–4.0)
MCH: 28.8 pg (ref 26.0–34.0)
MCHC: 32.7 g/dL (ref 30.0–36.0)
MCV: 88.2 fL (ref 80.0–100.0)
Monocytes Absolute: 0.8 10*3/uL (ref 0.1–1.0)
Monocytes Relative: 10 %
Neutro Abs: 6.4 10*3/uL (ref 1.7–7.7)
Neutrophils Relative %: 76 %
Platelets: 337 10*3/uL (ref 150–400)
RBC: 2.88 MIL/uL — ABNORMAL LOW (ref 4.22–5.81)
RDW: 15.9 % — ABNORMAL HIGH (ref 11.5–15.5)
WBC: 8.4 10*3/uL (ref 4.0–10.5)
nRBC: 0 % (ref 0.0–0.2)

## 2020-06-15 LAB — BASIC METABOLIC PANEL
Anion gap: 10 (ref 5–15)
BUN: 120 mg/dL — ABNORMAL HIGH (ref 8–23)
CO2: 22 mmol/L (ref 22–32)
Calcium: 7.7 mg/dL — ABNORMAL LOW (ref 8.9–10.3)
Chloride: 110 mmol/L (ref 98–111)
Creatinine, Ser: 5.12 mg/dL — ABNORMAL HIGH (ref 0.61–1.24)
GFR, Estimated: 12 mL/min — ABNORMAL LOW (ref >60)
Glucose, Bld: 199 mg/dL — ABNORMAL HIGH (ref 70–99)
Potassium: 4.3 mmol/L (ref 3.5–5.1)
Sodium: 142 mmol/L (ref 135–145)

## 2020-06-15 LAB — GLUCOSE, CAPILLARY
Glucose-Capillary: 177 mg/dL — ABNORMAL HIGH (ref 70–99)
Glucose-Capillary: 182 mg/dL — ABNORMAL HIGH (ref 70–99)
Glucose-Capillary: 279 mg/dL — ABNORMAL HIGH (ref 70–99)
Glucose-Capillary: 330 mg/dL — ABNORMAL HIGH (ref 70–99)
Glucose-Capillary: 254 mg/dL — ABNORMAL HIGH (ref 70–99)

## 2020-06-15 LAB — MAGNESIUM: Magnesium: 2.4 mg/dL (ref 1.7–2.4)

## 2020-06-15 LAB — PHOSPHORUS: Phosphorus: 9.6 mg/dL — ABNORMAL HIGH (ref 2.5–4.6)

## 2020-06-15 IMAGING — DX DG CHEST 1V
1 series · 1 of 1 positions shown · non-contrast
Comparison: [DATE]

CLINICAL DATA: Sepsis.

EXAM:
CHEST  1 VIEW

[chest ap]
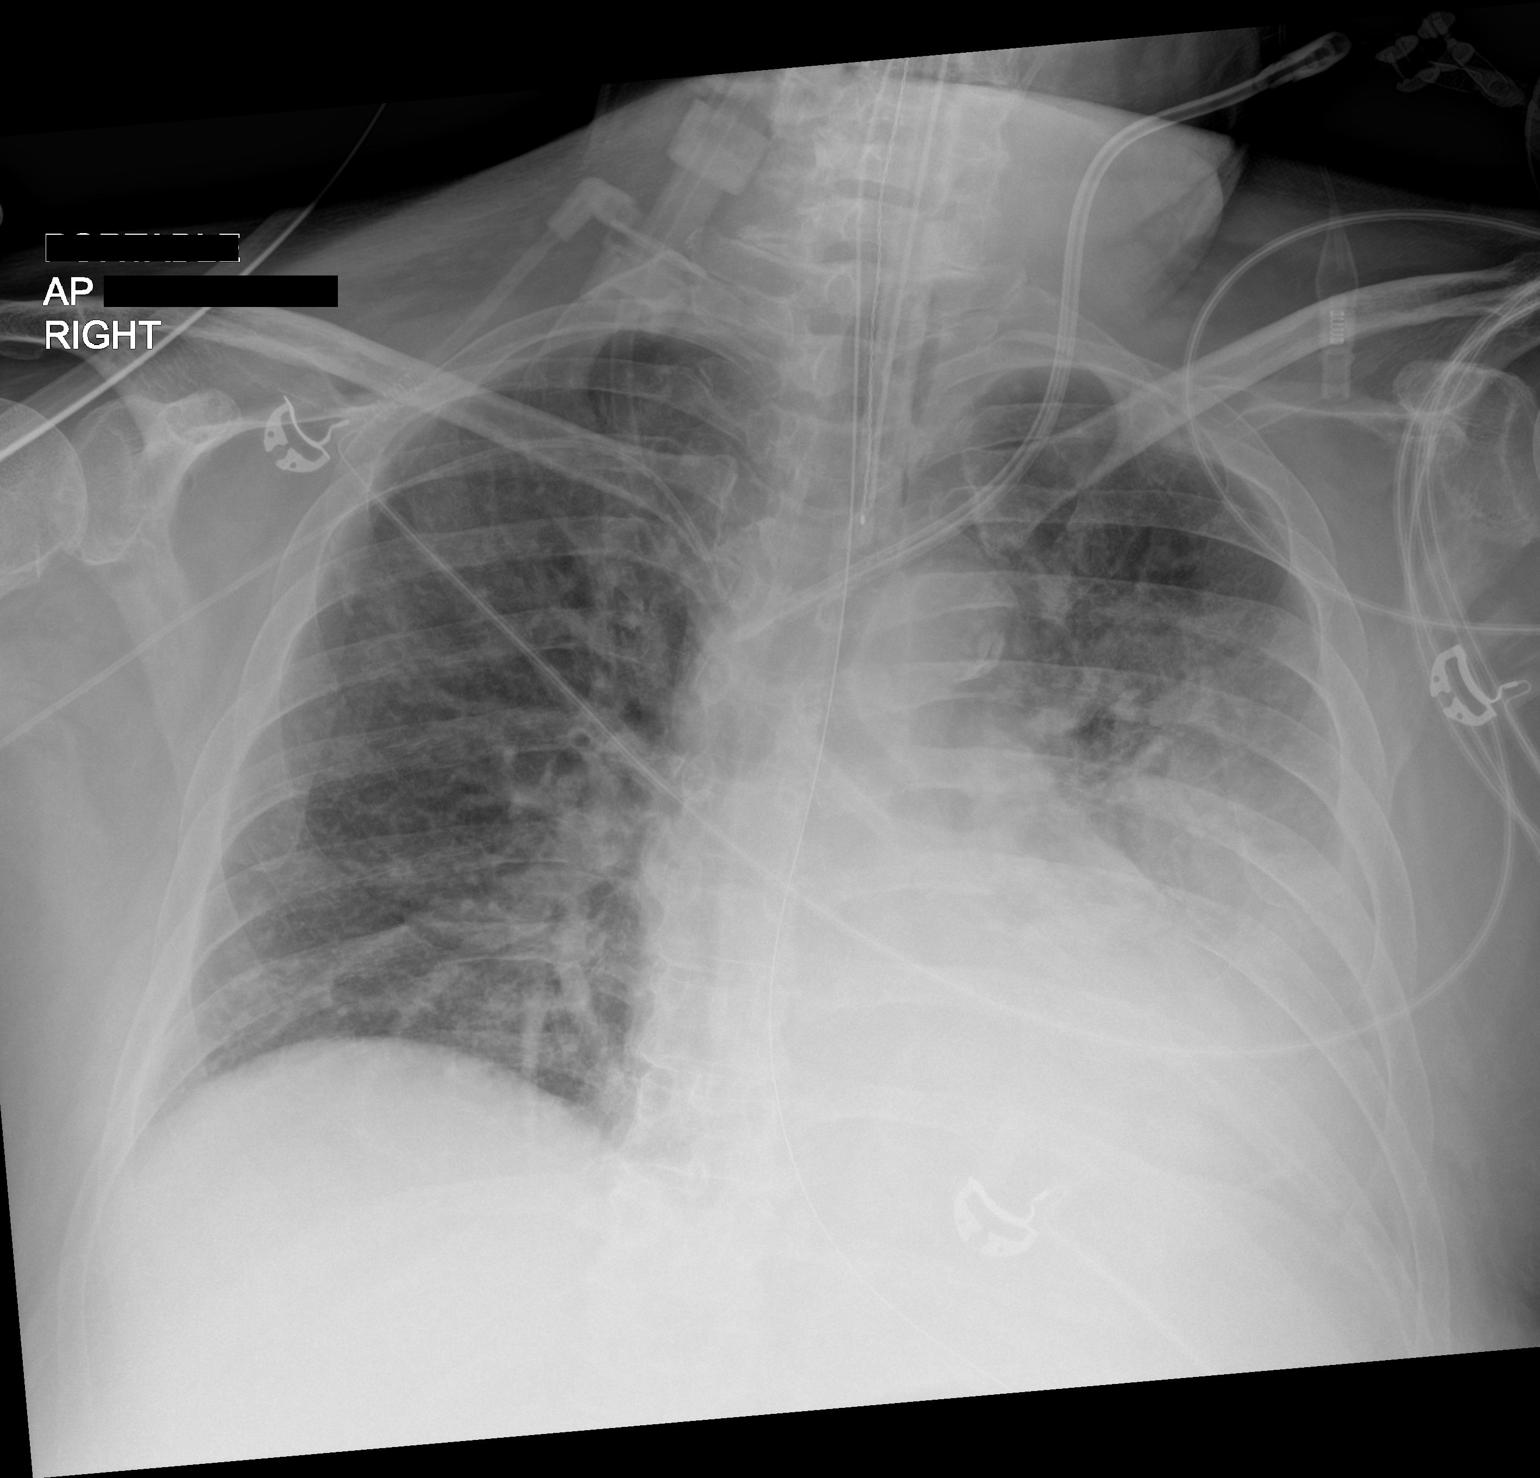

[1 of 1 positions shown; findings below may reference images not displayed]

FINDINGS: Endotracheal tube, esophageal T age probe, left IJ central venous
catheter and right-sided PICC line unchanged. Nasogastric tube
courses into the region of the stomach and off the film as tip is
not visualized.

Patient is slightly rotated to the left. Lungs are adequately
inflated demonstrate persistent hazy opacification over the left mid
to lower lung likely moderate size effusion with associated basilar
atelectasis. Mild hazy prominence of the central pulmonary vessels
likely associated degree of vascular congestion. Cardiomediastinal
silhouette and remainder of the exam is unchanged.
IMPRESSION: Stable hazy opacification over the left mid to lower lung likely
moderate size effusion with associated basilar atelectasis.
Infection over the left mid to lower lung is possible. Suggestion of
mild vascular congestion.

## 2020-06-15 MED ORDER — INSULIN ASPART 100 UNIT/ML IJ SOLN
5.0000 [IU] | INTRAMUSCULAR | Status: DC
  Administered 2020-06-16 – 2020-06-19 (×22): 5 [IU] via SUBCUTANEOUS
  Filled 2020-06-15 (×21): qty 1

## 2020-06-15 MED ORDER — MIDAZOLAM HCL 2 MG/2ML IJ SOLN
2.0000 mg | Freq: Once | INTRAMUSCULAR | Status: AC
  Administered 2020-06-15: 2 mg via INTRAVENOUS

## 2020-06-15 MED ORDER — OXYCODONE HCL 5 MG PO TABS
10.0000 mg | ORAL_TABLET | Freq: Four times a day (QID) | ORAL | Status: DC
  Administered 2020-06-15 – 2020-06-18 (×13): 10 mg
  Filled 2020-06-15 (×13): qty 2

## 2020-06-15 MED ORDER — HYDRALAZINE HCL 20 MG/ML IJ SOLN
10.0000 mg | INTRAMUSCULAR | Status: DC | PRN
  Administered 2020-06-15 – 2020-06-21 (×12): 10 mg via INTRAVENOUS
  Filled 2020-06-15 (×12): qty 1

## 2020-06-15 MED ORDER — LABETALOL HCL 200 MG PO TABS
200.0000 mg | ORAL_TABLET | Freq: Two times a day (BID) | ORAL | Status: DC
  Administered 2020-06-15 – 2020-06-19 (×9): 200 mg
  Filled 2020-06-15 (×10): qty 1

## 2020-06-15 MED ORDER — VECURONIUM BROMIDE 10 MG IV SOLR
INTRAVENOUS | Status: AC
  Administered 2020-06-15: 10 mg
  Filled 2020-06-15: qty 10

## 2020-06-15 MED ORDER — STERILE WATER FOR INJECTION IJ SOLN
INTRAMUSCULAR | Status: AC
  Administered 2020-06-15: 10 mL
  Filled 2020-06-15: qty 10

## 2020-06-15 MED ORDER — VECURONIUM BROMIDE 10 MG IV SOLR
10.0000 mg | Freq: Once | INTRAVENOUS | Status: AC
Start: 2020-06-15 — End: 2020-06-15
  Administered 2020-06-15: 10 mg via INTRAVENOUS

## 2020-06-15 MED ORDER — AMLODIPINE BESYLATE 5 MG PO TABS
5.0000 mg | ORAL_TABLET | Freq: Every day | ORAL | Status: DC
  Administered 2020-06-15 – 2020-06-19 (×5): 5 mg
  Filled 2020-06-15 (×5): qty 1

## 2020-06-15 MED ORDER — FUROSEMIDE 10 MG/ML IJ SOLN
40.0000 mg | Freq: Every day | INTRAMUSCULAR | Status: DC
  Administered 2020-06-15 – 2020-06-21 (×7): 40 mg via INTRAVENOUS
  Filled 2020-06-15 (×7): qty 4

## 2020-06-15 NOTE — Progress Notes (Signed)
[place], [place] 06/15/20  Subjective:   Hospital day # 11  Patient remains intubated and sedated. Creatinine higher at 5.12. Patient did receive Lasix and had good urine output response.   Renal: 06/10 0701 - 06/11 0700 In: 4284.6 [I.V.:744.6; TG/GY:6948; IV Piggyback:600] Out: 5462 [Urine:2835; Stool:630] Lab Results  Component Value Date   CREATININE 5.12 (H) 06/15/2020   CREATININE 4.67 (H) 06/14/2020   CREATININE 5.71 (H) 06/13/2020     Objective:  Vital signs in last 24 hours:  Temp:  [97.52 F (36.4 C)-99.9 F (37.7 C)] 98.24 F (36.8 C) (06/11 0600) Pulse Rate:  [79-96] 86 (06/11 0600) Resp:  [19-27] 19 (06/11 0600) BP: (151-190)/(60-75) 173/73 (06/11 0600) SpO2:  [93 %-96 %] 95 % (06/11 0600) FiO2 (%):  [30 %] 30 % (06/11 0919) Weight:  [101.8 kg] 101.8 kg (06/11 0359)  Weight change: -1.3 kg Filed Weights   06/13/20 0412 06/14/20 0326 06/15/20 0359  Weight: 102.1 kg 103.1 kg 101.8 kg    Intake/Output:    Intake/Output Summary (Last 24 hours) at 06/15/2020 0952 Last data filed at 06/15/2020 0400 Gross per 24 hour  Intake 4214.56 ml  Output 3105 ml  Net 1109.56 ml     Physical Exam:  General: Critically ill-appearing  HEENT anicteric,  ETT, OG tube  Pulm/lungs coarse breath sounds, ventilator assisted  CVS/Heart regular rhythm, no rubs  Abdomen:  Soft, nontender  Extremities: + dependent peripheral edema  Neurologic: Sedated  Skin:  Warm, dry  Coud catheter in place  Rectal tube in place, with liquidy stools   Basic Metabolic Panel:  Recent Labs  Lab 06/10/20 0450 06/11/20 0449 06/12/20 0435 06/13/20 0438 06/14/20 0304 06/15/20 0349  NA 141 142 142 142 142 142  K 3.3* 3.8 3.6 3.7 4.0 4.3  CL 111 108 111 113* 109 110  CO2 18* 22 22 20* 22 22  GLUCOSE 154* 255* 214* 188* 222* 199*  BUN 81* 86* 112* 119* 108* 120*  CREATININE 5.47* 4.98* 6.02* 5.71* 4.67* 5.12*  CALCIUM 8.0* 8.2* 8.0* 7.5* 7.7*  7.7*  MG 2.6* 2.6*  --  2.5* 2.2 2.4  PHOS 7.7* 7.5*  --  7.3* 5.8* 9.6*      CBC: Recent Labs  Lab 06/11/20 0449 06/12/20 0435 06/13/20 0438 06/14/20 0304 06/15/20 0349  WBC 10.1 7.5 6.4 9.6 8.4  NEUTROABS 9.1* 5.7  --  7.8* 6.4  HGB 10.1* 9.6* 8.7* 8.6* 8.3*  HCT 29.4* 28.8* 26.2* 26.1* 25.4*  MCV 85.7 87.8 88.8 87.3 88.2  PLT 167 205 232 313 337       Lab Results  Component Value Date   HEPBSAG NON REACTIVE 06/09/2020   HEPBSAB NON REACTIVE 06/09/2020      Microbiology:  Recent Results (from the past 240 hour(s))  Urine Culture     Status: None   Collection Time: 06/06/20  2:37 PM   Specimen: Urine, Random  Result Value Ref Range Status   Specimen Description   Final    URINE, RANDOM Performed at [facility], [address], [place], [place] [address]    Special Requests   Final    NONE Performed at [facility], [address] [place], [place] [address]    Culture   Final    NO GROWTH Performed at [facility], [address], [place], [place] [address]    Report Status 06/08/2020 FINAL  Final  MRSA PCR Screening     Status: None  Collection Time: 06/06/20  2:40 PM   Specimen: Nasopharyngeal  Result Value Ref Range Status   MRSA by PCR NEGATIVE NEGATIVE Final    Comment:        The GeneXpert MRSA Assay (FDA approved for NASAL specimens only), is one component of a comprehensive MRSA colonization surveillance program. It is not intended to diagnose MRSA infection nor to guide or monitor treatment for MRSA infections. Performed at [facility], [place], [place] [address]   Acid Fast Smear (AFB)     Status: None   Collection Time: 06/06/20  6:30 PM   Specimen: Bronchoalveolar Lavage; Sputum  Result Value Ref Range Status   AFB Specimen Processing Concentration  Final   Acid Fast Smear Negative  Final    Comment: (NOTE) Performed At: [facility] [place], [place]  [id number] Rush Farmer MD [id number]    Source (AFB) BRONCHIAL ALVEOLAR LAVAGE  Corrected    Comment: Performed at [facility], [address] [place], [place] [address] CORRECTED ON 06/02 AT 2239: PREVIOUSLY REPORTED AS SPUTUM   Culture, Respiratory w Gram Stain     Status: None   Collection Time: 06/06/20  6:30 PM   Specimen: Bronchoalveolar Lavage  Result Value Ref Range Status   Specimen Description   Final    BRONCHIAL ALVEOLAR LAVAGE Performed at [facility], [address], [place], [place] [address]    Special Requests   Final    NONE Performed at [facility], [address] [place], [place] [address]    Gram Stain   Final    FEW WBC PRESENT,BOTH PMN AND MONONUCLEAR NO ORGANISMS SEEN    Culture   Final    NO GROWTH 3 DAYS Performed at [facility], [place] [address] [place], [place] [address]    Report Status 06/09/2020 FINAL  Final  Fungus Culture With Stain     Status: None (Preliminary result)   Collection Time: 06/06/20  6:30 PM  Result Value Ref Range Status   Fungus Stain Final report  Final    Comment: (NOTE) Performed At: [facility] [place], [place] [id number] Rush Farmer MD [id number]    Fungus (Mycology) Culture PENDING  Incomplete   Fungal Source BRONCHIAL ALVEOLAR LAVAGE  Final    Comment: Performed at [facility], [place] [address], [place], [place] [address]  Fungus Culture Result     Status: None   Collection Time: 06/06/20  6:30 PM  Result Value Ref Range Status   Result 1 Comment  Final    Comment: (NOTE) KOH/Calcofluor preparation:  no fungus observed. Performed At: [facility] [place], [place] [id number] Rush Farmer MD [id number]   Respiratory (~20 pathogens) panel by PCR     Status: None   Collection Time: 06/07/20  5:50 AM   Specimen: Nasopharyngeal Swab; Respiratory  Result Value Ref Range Status   Adenovirus NOT DETECTED NOT  DETECTED Final   Coronavirus 229E NOT DETECTED NOT DETECTED Final    Comment: (NOTE) The Coronavirus on the Respiratory Panel, DOES NOT test for the novel  Coronavirus (2019 nCoV)    Coronavirus HKU1 NOT DETECTED NOT DETECTED Final   Coronavirus NL63 NOT DETECTED NOT DETECTED Final   Coronavirus OC43 NOT DETECTED NOT DETECTED Final   Metapneumovirus NOT DETECTED NOT DETECTED Final   Rhinovirus / Enterovirus NOT DETECTED NOT DETECTED Final   Influenza A NOT DETECTED NOT DETECTED Final  Influenza B NOT DETECTED NOT DETECTED Final   Parainfluenza Virus 1 NOT DETECTED NOT DETECTED Final   Parainfluenza Virus 2 NOT DETECTED NOT DETECTED Final   Parainfluenza Virus 3 NOT DETECTED NOT DETECTED Final   Parainfluenza Virus 4 NOT DETECTED NOT DETECTED Final   Respiratory Syncytial Virus NOT DETECTED NOT DETECTED Final   Bordetella pertussis NOT DETECTED NOT DETECTED Final   Bordetella Parapertussis NOT DETECTED NOT DETECTED Final   Chlamydophila pneumoniae NOT DETECTED NOT DETECTED Final   Mycoplasma pneumoniae NOT DETECTED NOT DETECTED Final    Comment: Performed at [facility], [place] [address] [place], [place] [address]    Coagulation Studies: No results for input(s): LABPROT, INR in the last 72 hours.  Urinalysis: No results for input(s): COLORURINE, LABSPEC, PHURINE, GLUCOSEU, HGBUR, BILIRUBINUR, KETONESUR, PROTEINUR, UROBILINOGEN, NITRITE, LEUKOCYTESUR in the last 72 hours.  Invalid input(s): APPERANCEUR    Imaging: DG Chest Port 1 View  Result Date: 06/13/2020 CLINICAL DATA:  Acute respiratory failure with hypoxia. EXAM: PORTABLE CHEST 1 VIEW COMPARISON:  June 10, 2020 FINDINGS: Increased pulmonary interstitium is identified in bilateral lungs consistent with pulmonary edema. There is interval better aeration of the left upper lobe. Persistent consolidation of the left mid and lung base is noted. Endotracheal tube is identified with distal tip 5.4 cm from carina. Nasogastric  tube is identified with distal tip not included on film but is at least in the stomach. Right PICC line is identified distal tip in the superior vena cava. IMPRESSION: 1. Pulmonary edema. 2. Improved aeration of the left upper lobe. Persistent consolidation of the left mid and lung base. Electronically Signed   By: Abelardo Diesel M.D.   On: 06/13/2020 21:27      Medications:    sodium chloride 10 mL/hr at 06/15/20 0106   dexmedetomidine (PRECEDEX) IV infusion 0.8 mcg/kg/hr (06/15/20 0106)   doxycycline (VIBRAMYCIN) IV 100 mg (06/15/20 0923)   feeding supplement (VITAL HIGH PROTEIN) 1,000 mL (06/13/20 0315)   fentaNYL infusion INTRAVENOUS 200 mcg/hr (06/15/20 0706)   levofloxacin (LEVAQUIN) IV Stopped (06/14/20 1821)    budesonide (PULMICORT) nebulizer solution  0.5 mg Nebulization BID   chlorhexidine gluconate (MEDLINE KIT)  15 mL Mouth Rinse BID   Chlorhexidine Gluconate Cloth  6 each Topical Daily   cloNIDine  0.2 mg Transdermal Weekly   diazepam  5 mg Per Tube Q6H   feeding supplement (PROSource TF)  45 mL Per Tube Daily   free water  100 mL Per Tube Q4H   heparin injection (subcutaneous)  5,000 Units Subcutaneous Q8H   insulin aspart  0-20 Units Subcutaneous Q4H   insulin glargine  10 Units Subcutaneous Daily   ipratropium-albuterol  3 mL Nebulization Q4H   mouth rinse  15 mL Mouth Rinse 10 times per day   methylPREDNISolone (SOLU-MEDROL) injection  20 mg Intravenous Daily   multivitamin  1 tablet Per Tube QHS   oxyCODONE  10 mg Per Tube Q6H   pantoprazole (PROTONIX) IV  40 mg Intravenous Q24H   sodium bicarbonate  1,300 mg Per Tube BID   sodium chloride flush  10-40 mL Intracatheter Q12H   traZODone  150 mg Per Tube QHS   sodium chloride, fentaNYL, heparin, labetalol, midazolam, ondansetron **OR** ondansetron (ZOFRAN) IV, sodium chloride flush  Assessment/ Plan:  60 y.o. male with  medical problems of   Hypertension, PTSD, diabetes, migraines, GERD, neurogenic bladder  secondary to remote history of transverse myelitis, requires self-catheterization and has frequent UTIs, opioids for chronic  pain  admitted on 06/04/2020 for Sepsis ([place]) [A41.9] Altered mental status, unspecified altered mental status type [R41.82] Community acquired pneumonia, unspecified laterality [J18.9]   #Acute kidney injury, proteinuria, hematuria Baseline creatinine of 1.26 at admission Increasing creatinine trends since June 3. AKI is likely multifactorial secondary to ongoing sepsis as well as IV contrast exposure on June 3 Urinalysis at admission showed glucosuria, greater than 300 proteinuria, 6-10 RBCs, moderate hemoglobin noted on dipstick, 11-20 WBCs Renal imaging as noted above.  Patient had CT abdomen pelvis without contrast.  No hydronephrosis.  Bladder was unremarkable. UPC 2.55 gm RBC 21-50 cc June 09, 2020: ANA negative, ESR normal, complement C3-C4 normal, kappa lambda ratio normal, hepatitis B and hepatitis C studies negative, HIV negative, crypto antigen negative Hemodialysis started on June 6, had treatment on 6/6 and 6/9.   Plan: Good urine output yesterday 2.8 L.  Creatinine is worse.  Hold off on additional dialysis for now.  Administer Lasix 40 mg IV x1.   #Acute respiratory failure Continue ventilatory support at this time in an effort to wean patient from the ventilator.   #Dependent peripheral edema Administer Lasix 40 mg IV x1 again today.    LOS: 11 Jerah Esty 6/11/20229:52 [place], [place]  Note: This note was prepared with Dragon dictation. Any transcription errors are unintentional

## 2020-06-15 NOTE — Progress Notes (Signed)
NAME:  Wyatt Lindon., MRN:  [id number], DOB:  25-Oct-1960, LOS: 68 ADMISSION DATE:  06/04/2020 60 y.o. male with medical history significant for HTN, PTSD, diabetes, migraines, GERD, neurogenic bladder secondary to remote history of transverse myelitis, who self catheterizes and has frequent UTIs as well as chronic back pain on chronic opiates who at baseline is independent - brought to the ER with a 3-day history of altered mental status.   + cough, shortness of breath and fever as well as vomiting and diarrhea and started becoming increasingly confused in the 24 hours prior to arrival.       Unable to get any meaningful history from patient.   ED course: On arrival, temperature 99.3, BP 167/67, pulse 99, respirations 18 with O2 sat 96% on room air.  Blood work significant for leukocytosis of 13,600 with lactic acid of 1.2.  Ammonia level normal at 15, EtOH less than 10.  Sodium 130, potassium 3.2, glucose 249 creatinine 1.06.  Urinalysis with 80 ketones and rare bacteria.  UDS positive for opiates and tricyclic's.  COVID and flu negative       Imaging:   CT chest abdomen and pelvis without contrast consistent with pneumonia.  Multiple nodules left upper lobe likely infectious or inflammatory with follow-up imaging recommended to document resolution.   Cardiomegaly with trace pericardial effusion   Patient was started on sepsis fluids and broad-spectrum antibiotics initially for sepsis of unknown source.       Significant Hospital Events: Including procedures, antibiotic start and stop dates in addition to other pertinent events   5/31 admitted for sepsis present on admission for Left lung pneumonia 6/1 progressive confusion and hypoxia 6/2 transferred to ICU, INTUBATED, S/P BRONCH 6/3 high fevers, remains ill, SELF EXTUBATED 6/3 emergently re-intubated 6/5 failure to wean from v ent 6/6 failure to wean from vent, VASC CATH PLACED 6/7 HD started remains critically ill 6/8  remains on vent 6/9 waiting for family for SAT   Interim History / Subjective:  Multiorgan failure Remains critically ill +diastolic dysfunction Not tolerating SBT today    CBC    Component Value Date/Time   WBC 8.4 06/15/2020 0349   RBC 2.88 (L) 06/15/2020 0349   HGB 8.3 (L) 06/15/2020 0349   HCT 25.4 (L) 06/15/2020 0349   PLT 337 06/15/2020 0349   MCV 88.2 06/15/2020 0349   MCH 28.8 06/15/2020 0349   MCHC 32.7 06/15/2020 0349   RDW 15.9 (H) 06/15/2020 0349   LYMPHSABS 1.1 06/15/2020 0349   MONOABS 0.8 06/15/2020 0349   EOSABS 0.0 06/15/2020 0349   BASOSABS 0.0 06/15/2020 0349     BMP Latest Ref Rng & Units 06/15/2020 06/14/2020 06/13/2020  Glucose 70 - 99 mg/dL 199(H) 222(H) 188(H)  BUN 8 - 23 mg/dL 120(H) 108(H) 119(H)  Creatinine 0.61 - 1.24 mg/dL 5.12(H) 4.67(H) 5.71(H)  Sodium 135 - 145 mmol/L 142 142 142  Potassium 3.5 - 5.1 mmol/L 4.3 4.0 3.7  Chloride 98 - 111 mmol/L 110 109 113(H)  CO2 22 - 32 mmol/L 22 22 20(L)  Calcium 8.9 - 10.3 mg/dL 7.7(L) 7.7(L) 7.5(L)       Objective   Blood pressure (!) 193/86, pulse 100, temperature 99.1 F (37.3 C), resp. rate 18, height 5' 6"  (1.676 m), weight 101.8 kg, SpO2 94 %.    Vent Mode: PRVC FiO2 (%):  [30 %] 30 % Set Rate:  [16 bmp] 16 bmp Vt Set:  [520 mL] 520 mL PEEP:  [5 cmH20] 5 cmH20  Plateau Pressure:  [0 cmH20] 0 cmH20   Intake/Output Summary (Last 24 hours) at 06/15/2020 1058 Last data filed at 06/15/2020 1017 Gross per 24 hour  Intake 4214.56 ml  Output 3955 ml  Net 259.56 ml    Filed Weights   06/13/20 0412 06/14/20 0326 06/15/20 0359  Weight: 102.1 kg 103.1 kg 101.8 kg   Scheduled Meds:  amLODipine  5 mg Per Tube Daily   budesonide (PULMICORT) nebulizer solution  0.5 mg Nebulization BID   chlorhexidine gluconate (MEDLINE KIT)  15 mL Mouth Rinse BID   Chlorhexidine Gluconate Cloth  6 each Topical Daily   cloNIDine  0.2 mg Transdermal Weekly   diazepam  5 mg Per Tube Q6H   feeding supplement  (PROSource TF)  45 mL Per Tube Daily   free water  100 mL Per Tube Q4H   furosemide  40 mg Intravenous Daily   heparin injection (subcutaneous)  5,000 Units Subcutaneous Q8H   insulin aspart  0-20 Units Subcutaneous Q4H   insulin glargine  10 Units Subcutaneous Daily   ipratropium-albuterol  3 mL Nebulization Q4H   labetalol  200 mg Per Tube BID   mouth rinse  15 mL Mouth Rinse 10 times per day   methylPREDNISolone (SOLU-MEDROL) injection  20 mg Intravenous Daily   multivitamin  1 tablet Per Tube QHS   oxyCODONE  10 mg Per Tube Q6H   pantoprazole (PROTONIX) IV  40 mg Intravenous Q24H   sodium bicarbonate  1,300 mg Per Tube BID   sodium chloride flush  10-40 mL Intracatheter Q12H   traZODone  150 mg Per Tube QHS   Continuous Infusions:  sodium chloride 10 mL/hr at 06/15/20 0106   dexmedetomidine (PRECEDEX) IV infusion 0.4 mcg/kg/hr (06/15/20 1018)   doxycycline (VIBRAMYCIN) IV 100 mg (06/15/20 0923)   feeding supplement (VITAL HIGH PROTEIN) 1,000 mL (06/13/20 7544)   fentaNYL infusion INTRAVENOUS 50 mcg/hr (06/15/20 1017)   levofloxacin (LEVAQUIN) IV Stopped (06/14/20 1821)   PRN Meds:.sodium chloride, fentaNYL, heparin, hydrALAZINE, midazolam, ondansetron **OR** ondansetron (ZOFRAN) IV, sodium chloride flush    REVIEW OF SYSTEMS  PATIENT IS UNABLE TO PROVIDE COMPLETE REVIEW OF SYSTEMS DUE TO SEVERE CRITICAL ILLNESS AND TOXIC METABOLIC ENCEPHALOPATHY   PHYSICAL EXAMINATION:  GENERAL:critically ill appearing,intubated, sedated HEAD: Normocephalic, atraumatic. EYES: Pupils equal, round, reactive to light.  No scleral icterus. MOUTH: ETT, OG in place NECK: Supple. Trachea midline PULMONARY: +rhonchi, no wheezing, even CARDIOVASCULAR: S1 and S2. Regular rate and rhythm. No murmurs, rubs, or gallops. GASTROINTESTINAL: Soft, non-distended. Positive bowel sounds. MUSCULOSKELETAL: No swelling, clubbing, or edema. NEUROLOGIC: sedated SKIN:intact,warm,dry  ASSESSMENT AND  PLAN  ACUTE Hypoxic and Hypercapnic Respiratory Failure -continue Mechanical Ventilator support -continue Bronchodilator Therapy -Wean Fio2 and PEEP as tolerated -Continue VAP/VENT bundle -will perform SAT/SBT when respiratory parameters are met -too sedated for SBT -weaning sedatives     SEVERE COPD EXACERBATION -continue IV steroids -continue NEB THERAPY     ACUTE KIDNEY INJURY/Renal Failure -continue Foley Catheter-assess need -Avoid nephrotoxic agents -Follow urine output, BMP -Ensure adequate renal perfusion, optimize oxygenation -Renal dose medications -No HD today per Dr. Holley Raring     Acute toxic metabolic encephalopathy Goal RASS -2 to -3 Precedex     SEPTIC SHOCK -resolved -use vasopressors to keep MAP>65 as needed -follow ABG and LA -follow up cultures -continueABX   INFECTIOUS DISEASE -continue antibiotics as prescribed -follow up cultures -follow up ID recs   MRSA NEG RVP NEGATIVE COVID NEG INF AB NEG SPUTUM CX NG AFB SMEAR  NEG HIV NEG   ENDO - ICU hypoglycemic\Hyperglycemia protocol -check FSBS per protocol     GI GI PROPHYLAXIS as indicated   NUTRITIONAL STATUS DIET-->TF's as tolerated Constipation protocol as indicated     ELECTROLYTES -follow labs as needed -replace as needed -pharmacy consultation and following   MAR reviewed with clinical pharmacist     Best practice (right click and "Reselect all SmartList Selections" daily)  Diet:  NPO Pain/Anxiety/Delirium protocol (if indicated): Yes (RASS goal -2) VAP protocol (if indicated): Yes DVT prophylaxis: Subcutaneous Heparin GI prophylaxis: H2B Glucose control:  SSI Yes Central venous access:  N/A Arterial line:  N/A Foley:  N/A Mobility:  bed rest  PT consulted: N/A Code Status:  full code Disposition: ICU   Labs   CBC: Recent Labs  Lab 06/11/20 0449 06/12/20 0435 06/13/20 0438 06/14/20 0304 06/15/20 0349  WBC 10.1 7.5 6.4 9.6 8.4  NEUTROABS 9.1* 5.7  --   7.8* 6.4  HGB 10.1* 9.6* 8.7* 8.6* 8.3*  HCT 29.4* 28.8* 26.2* 26.1* 25.4*  MCV 85.7 87.8 88.8 87.3 88.2  PLT 167 205 232 313 337     Basic Metabolic Panel: Recent Labs  Lab 06/10/20 0450 06/11/20 0449 06/12/20 0435 06/13/20 0438 06/14/20 0304 06/15/20 0349  NA 141 142 142 142 142 142  K 3.3* 3.8 3.6 3.7 4.0 4.3  CL 111 108 111 113* 109 110  CO2 18* 22 22 20* 22 22  GLUCOSE 154* 255* 214* 188* 222* 199*  BUN 81* 86* 112* 119* 108* 120*  CREATININE 5.47* 4.98* 6.02* 5.71* 4.67* 5.12*  CALCIUM 8.0* 8.2* 8.0* 7.5* 7.7* 7.7*  MG 2.6* 2.6*  --  2.5* 2.2 2.4  PHOS 7.7* 7.5*  --  7.3* 5.8* 9.6*    GFR: Estimated Creatinine Clearance: 16.9 mL/min (A) (by C-G formula based on SCr of 5.12 mg/dL (H)). Recent Labs  Lab 06/12/20 0435 06/13/20 0438 06/14/20 0304 06/15/20 0349  WBC 7.5 6.4 9.6 8.4     Liver Function Tests: Recent Labs  Lab 06/09/20 0511 06/10/20 0450 06/11/20 0449 06/14/20 0304  AST 80*  --   --   --   ALT 68*  --   --   --   ALKPHOS 93  --   --   --   BILITOT 0.9  --   --   --   PROT 4.8*  --   --   --   ALBUMIN 1.7* 1.6* 2.4* 1.9*    No results for input(s): LIPASE, AMYLASE in the last 168 hours. No results for input(s): AMMONIA in the last 168 hours.  ABG    Component Value Date/Time   PHART 7.33 (L) 06/08/2020 0500   PCO2ART 40 06/08/2020 0500   PO2ART 99 06/08/2020 0500   HCO3 21.1 06/08/2020 0500   ACIDBASEDEF 4.5 (H) 06/08/2020 0500   O2SAT 97.2 06/08/2020 0500      Coagulation Profile: No results for input(s): INR, PROTIME in the last 168 hours.  Cardiac Enzymes: Recent Labs  Lab 06/09/20 0901  CKTOTAL 506*     HbA1C: Hgb A1c MFr Bld  Date/Time Value Ref Range Status  06/04/2020 09:48 PM 7.4 (H) 4.8 - 5.6 % Final    Comment:    (NOTE)         Prediabetes: 5.7 - 6.4         Diabetes: >6.4         Glycemic control for adults with diabetes: <7.0     CBG: Recent Labs  Lab 06/14/20 1616 06/14/20 2001 06/14/20 2349  06/15/20 0339 06/15/20 0707  GLUCAP 220* 290* 254* 177* 182*     Allergies No Known Allergies      The patient is critically ill with multiple organ systems failure and requires high complexity decision making for assessment and support, frequent evaluation and titration of therapies, application of advanced monitoring technologies and extensive interpretation of multiple databases. Critical Care Time devoted to patient care services described in this note is 35 minutes.  Renold Don, MD [facility]   *This note was dictated using voice recognition software/Dragon.  Despite best efforts to proofread, errors can occur which can change the meaning.  Any change was purely unintentional.

## 2020-06-15 NOTE — Progress Notes (Addendum)
1130 Patients respiratory rate up to 55 breaths per minute. Heart rate up to1 30s and B/P up to 245/94 after prn for elevated B/P given IVP. Lung sounds are more congested than 0800. Attempted to suction x 2 without any sputum retrieved. Precedex and Fentanyl increased. Bolous of Fentanyl given without effect. RT called for help. Dr. Jayme Cloud consulted. Orders received. Patient given 2mg  Midazolam and Vecuronium 10mg  IVP stat. Portable chest xray also done. Finally patient calmed at 1215. Nurse with patient for entire episode of respiratory distress.1245 Patient much calmer. Heart rate down to106,RR 24 and B/P 163/82.1300-1900 No respiratory distress noted. Patient still has bilateral rhonchi. Chest xray is essentially unchanged.

## 2020-06-15 NOTE — Consult Note (Signed)
PHARMACY CONSULT NOTE  Pharmacy Consult for Electrolyte Monitoring and Replacement   Recent Labs: Potassium (mmol/L)  Date Value  06/15/2020 4.3   Magnesium (mg/dL)  Date Value  37/48/2707 2.4   Calcium (mg/dL)  Date Value  86/75/4492 7.7 (L)   Albumin (g/dL)  Date Value  01/00/7121 1.9 (L)   Phosphorus (mg/dL)  Date Value  97/58/8325 9.6 (H)   Sodium (mmol/L)  Date Value  06/15/2020 142   Assessment: Patient is a 60 y/o M with medical history including diabetes, migraines, GERD, neurogenic bladder, HTN, chronic opioid use who is admitted with sepsis secondary to pneumonia. Patient is admitted to the ICU and is currently intubated, sedated, and on mechanical ventilation.   Pharmacy consulted to assist with electrolyte monitoring and replacement as indicated. Patient received first HD treatment 6/6. Planning to assess daily for HD needs. Patient is receiving intermittent diuresis.  Nutrition: tube feeds + free water flushes 100 mL q4h  Goal of Therapy:  Electrolytes within normal limits  Plan:  --Phos 9.6, defer management of hyperphosphatemia to nephrology --Monitor electrolytes with AM labs  Tressie Ellis 06/15/2020 8:00 AM

## 2020-06-16 DIAGNOSIS — J9601 Acute respiratory failure with hypoxia: Secondary | ICD-10-CM | POA: Diagnosis not present

## 2020-06-16 DIAGNOSIS — R4182 Altered mental status, unspecified: Secondary | ICD-10-CM | POA: Diagnosis not present

## 2020-06-16 LAB — BASIC METABOLIC PANEL
Anion gap: 13 (ref 5–15)
BUN: 161 mg/dL — ABNORMAL HIGH (ref 8–23)
CO2: 22 mmol/L (ref 22–32)
Calcium: 8.4 mg/dL — ABNORMAL LOW (ref 8.9–10.3)
Chloride: 111 mmol/L (ref 98–111)
Creatinine, Ser: 6.03 mg/dL — ABNORMAL HIGH (ref 0.61–1.24)
GFR, Estimated: 10 mL/min — ABNORMAL LOW (ref >60)
Glucose, Bld: 204 mg/dL — ABNORMAL HIGH (ref 70–99)
Potassium: 4.8 mmol/L (ref 3.5–5.1)
Sodium: 146 mmol/L — ABNORMAL HIGH (ref 135–145)

## 2020-06-16 LAB — GLUCOSE, CAPILLARY
Glucose-Capillary: 181 mg/dL — ABNORMAL HIGH (ref 70–99)
Glucose-Capillary: 200 mg/dL — ABNORMAL HIGH (ref 70–99)
Glucose-Capillary: 210 mg/dL — ABNORMAL HIGH (ref 70–99)
Glucose-Capillary: 212 mg/dL — ABNORMAL HIGH (ref 70–99)
Glucose-Capillary: 213 mg/dL — ABNORMAL HIGH (ref 70–99)
Glucose-Capillary: 226 mg/dL — ABNORMAL HIGH (ref 70–99)
Glucose-Capillary: 232 mg/dL — ABNORMAL HIGH (ref 70–99)
Glucose-Capillary: 235 mg/dL — ABNORMAL HIGH (ref 70–99)
Glucose-Capillary: 273 mg/dL — ABNORMAL HIGH (ref 70–99)
Glucose-Capillary: 321 mg/dL — ABNORMAL HIGH (ref 70–99)

## 2020-06-16 LAB — CBC WITH DIFFERENTIAL/PLATELET
Abs Immature Granulocytes: 0.06 10*3/uL (ref 0.00–0.07)
Basophils Absolute: 0 10*3/uL (ref 0.0–0.1)
Basophils Relative: 0 %
Eosinophils Absolute: 0 10*3/uL (ref 0.0–0.5)
Eosinophils Relative: 0 %
HCT: 25.4 % — ABNORMAL LOW (ref 39.0–52.0)
Hemoglobin: 8.6 g/dL — ABNORMAL LOW (ref 13.0–17.0)
Immature Granulocytes: 1 %
Lymphocytes Relative: 13 %
Lymphs Abs: 1 10*3/uL (ref 0.7–4.0)
MCH: 29.3 pg (ref 26.0–34.0)
MCHC: 33.9 g/dL (ref 30.0–36.0)
MCV: 86.4 fL (ref 80.0–100.0)
Monocytes Absolute: 0.6 10*3/uL (ref 0.1–1.0)
Monocytes Relative: 8 %
Neutro Abs: 6.1 10*3/uL (ref 1.7–7.7)
Neutrophils Relative %: 78 %
Platelets: 387 10*3/uL (ref 150–400)
RBC: 2.94 MIL/uL — ABNORMAL LOW (ref 4.22–5.81)
RDW: 15.9 % — ABNORMAL HIGH (ref 11.5–15.5)
WBC: 7.8 10*3/uL (ref 4.0–10.5)
nRBC: 0 % (ref 0.0–0.2)

## 2020-06-16 LAB — PHOSPHORUS: Phosphorus: 11.5 mg/dL — ABNORMAL HIGH (ref 2.5–4.6)

## 2020-06-16 LAB — ALBUMIN: Albumin: 1.9 g/dL — ABNORMAL LOW (ref 3.5–5.0)

## 2020-06-16 LAB — MAGNESIUM: Magnesium: 2.7 mg/dL — ABNORMAL HIGH (ref 1.7–2.4)

## 2020-06-16 MED ORDER — INSULIN GLARGINE 100 UNIT/ML ~~LOC~~ SOLN
15.0000 [IU] | Freq: Every day | SUBCUTANEOUS | Status: DC
  Administered 2020-06-16: 15 [IU] via SUBCUTANEOUS
  Filled 2020-06-16 (×2): qty 0.15

## 2020-06-16 MED ORDER — INSULIN GLARGINE 100 UNIT/ML ~~LOC~~ SOLN
20.0000 [IU] | Freq: Every day | SUBCUTANEOUS | Status: DC
  Administered 2020-06-17 – 2020-08-02 (×45): 20 [IU] via SUBCUTANEOUS
  Filled 2020-06-16 (×48): qty 0.2

## 2020-06-16 NOTE — Progress Notes (Signed)
Better day. Sedation stopped at 0735 and placed on spontaneous at 1000. Awakened to follow very simple commands by 1200. Unable to control head movent or lift head off of pillow. 1540 patient was becoming fatigued so sedation restarted.1630 Placed back on full ventilator support. Given bolus of Precedex and Fentanyl to calm patient. 1800 Patient resting quietly.

## 2020-06-16 NOTE — Consult Note (Signed)
PHARMACY CONSULT NOTE  Pharmacy Consult for Electrolyte Monitoring and Replacement   Recent Labs: Potassium (mmol/L)  Date Value  06/16/2020 4.8   Magnesium (mg/dL)  Date Value  36/06/7701 2.7 (H)   Calcium (mg/dL)  Date Value  40/35/2481 8.4 (L)   Albumin (g/dL)  Date Value  85/90/9311 1.9 (L)   Phosphorus (mg/dL)  Date Value  21/62/4469 11.5 (H)   Sodium (mmol/L)  Date Value  06/16/2020 146 (H)   Assessment: Patient is a 60 y/o M with medical history including diabetes, migraines, GERD, neurogenic bladder, HTN, chronic opioid use who is admitted with sepsis secondary to pneumonia. Patient is admitted to the ICU and is currently intubated, sedated, and on mechanical ventilation.   Pharmacy consulted to assist with electrolyte monitoring and replacement as indicated. Patient received first HD treatment 6/6. Planning to assess daily for HD needs. Patient is receiving intermittent diuresis.  Nutrition: tube feeds + free water flushes 100 mL q4h  Goal of Therapy:  Electrolytes within normal limits  Plan:  --No replacement needed at this time.  --Monitor electrolytes with AM labs  Ronnald Ramp 06/16/2020 7:49 AM

## 2020-06-16 NOTE — Progress Notes (Signed)
1100  NAME:  Wyatt Ball., MRN:  [id number], DOB:  1960/04/28, LOS: 38 ADMISSION DATE:  06/04/2020 60 y.o. male with medical history significant for HTN, PTSD, diabetes, migraines, GERD, neurogenic bladder secondary to remote history of transverse myelitis, who self catheterizes and has frequent UTIs as well as chronic back pain on chronic opiates who at baseline is independent - brought to the ER with a 3-day history of altered mental status.   + cough, shortness of breath and fever as well as vomiting and diarrhea and started becoming increasingly confused in the 24 hours prior to arrival.       Unable to get any meaningful history from patient.   ED course: On arrival, temperature 99.3, BP 167/67, pulse 99, respirations 18 with O2 sat 96% on room air.  Blood work significant for leukocytosis of 13,600 with lactic acid of 1.2.  Ammonia level normal at 15, EtOH less than 10.  Sodium 130, potassium 3.2, glucose 249 creatinine 1.06.  Urinalysis with 80 ketones and rare bacteria.  UDS positive for opiates and tricyclic's.  COVID and flu negative       Imaging:   CT chest abdomen and pelvis without contrast consistent with pneumonia.  Multiple nodules left upper lobe likely infectious or inflammatory with follow-up imaging recommended to document resolution.   Cardiomegaly with trace pericardial effusion   Patient was started on sepsis fluids and broad-spectrum antibiotics initially for sepsis of unknown source.       Significant Hospital Events: Including procedures, antibiotic start and stop dates in addition to other pertinent events   5/31 admitted for sepsis present on admission for Left lung pneumonia 6/1 progressive confusion and hypoxia 6/2 transferred to ICU, INTUBATED, S/P BRONCH 6/3 high fevers, remains ill, SELF EXTUBATED 6/3 emergently re-intubated 6/5 failure to wean from v ent 6/6 failure to wean from vent, VASC CATH PLACED 6/7 HD started remains critically ill 6/8  remains on vent 6/9 waiting for family for SAT 6/10 not tolerating SBT 6/12 tolerating SBT   Interim History / Subjective:  Multiorgan failure Remains critically ill +diastolic dysfunction, diuresing with Lasix Tolerating SBT today but still slow to follow commands   CBC    Component Value Date/Time   WBC 7.8 06/16/2020 0509   RBC 2.94 (L) 06/16/2020 0509   HGB 8.6 (L) 06/16/2020 0509   HCT 25.4 (L) 06/16/2020 0509   PLT 387 06/16/2020 0509   MCV 86.4 06/16/2020 0509   MCH 29.3 06/16/2020 0509   MCHC 33.9 06/16/2020 0509   RDW 15.9 (H) 06/16/2020 0509   LYMPHSABS 1.0 06/16/2020 0509   MONOABS 0.6 06/16/2020 0509   EOSABS 0.0 06/16/2020 0509   BASOSABS 0.0 06/16/2020 0509     BMP Latest Ref Rng & Units 06/16/2020 06/15/2020 06/14/2020  Glucose 70 - 99 mg/dL 204(H) 199(H) 222(H)  BUN 8 - 23 mg/dL 161(H) 120(H) 108(H)  Creatinine 0.61 - 1.24 mg/dL 6.03(H) 5.12(H) 4.67(H)  Sodium 135 - 145 mmol/L 146(H) 142 142  Potassium 3.5 - 5.1 mmol/L 4.8 4.3 4.0  Chloride 98 - 111 mmol/L 111 110 109  CO2 22 - 32 mmol/L _0 Calcium 8.9 - 10.3 mg/dL 8.4(L) 7.7(L) 7.7(L)       Objective   Blood pressure (!) 184/95, pulse (!) 109, temperature 98.7 F (37.1 C), resp. rate 19, height _1  (1.676 m), weight 97.3 kg, SpO2 95 %.    Vent Mode: PRVC FiO2 (%):  [28 %] 28 % Set Rate:  [24  bmp] 24 bmp Vt Set:  [520 mL] 520 mL PEEP:  [5 cmH20] 5 cmH20 Pressure Support:  [8 cmH20] 8 cmH20 Plateau Pressure:  [16 cmH20-18 cmH20] 18 cmH20   Intake/Output Summary (Last 24 hours) at 06/16/2020 2346 Last data filed at 06/16/2020 2148 Gross per 24 hour  Intake 2601.47 ml  Output 3425 ml  Net -823.53 ml    Filed Weights   06/15/20 0359 06/16/20 0500 06/16/20 0715  Weight: 101.8 kg 99.8 kg 97.3 kg   Scheduled Meds:  amLODipine  5 mg Per Tube Daily   budesonide (PULMICORT) nebulizer solution  0.5 mg Nebulization BID   chlorhexidine gluconate (MEDLINE KIT)  15 mL Mouth Rinse BID    Chlorhexidine Gluconate Cloth  6 each Topical Daily   cloNIDine  0.2 mg Transdermal Weekly   diazepam  5 mg Per Tube Q6H   feeding supplement (PROSource TF)  45 mL Per Tube Daily   free water  100 mL Per Tube Q4H   furosemide  40 mg Intravenous Daily   heparin injection (subcutaneous)  5,000 Units Subcutaneous Q8H   insulin aspart  0-20 Units Subcutaneous Q4H   insulin aspart  5 Units Subcutaneous Q4H   [START ON 06/17/2020] insulin glargine  20 Units Subcutaneous Daily   ipratropium-albuterol  3 mL Nebulization Q4H   labetalol  200 mg Per Tube BID   mouth rinse  15 mL Mouth Rinse 10 times per day   methylPREDNISolone (SOLU-MEDROL) injection  20 mg Intravenous Daily   multivitamin  1 tablet Per Tube QHS   oxyCODONE  10 mg Per Tube Q6H   pantoprazole (PROTONIX) IV  40 mg Intravenous Q24H   sodium bicarbonate  1,300 mg Per Tube BID   sodium chloride flush  10-40 mL Intracatheter Q12H   traZODone  150 mg Per Tube QHS   Continuous Infusions:  sodium chloride 250 mL (06/16/20 2146)   dexmedetomidine (PRECEDEX) IV infusion 1.2 mcg/kg/hr (06/16/20 1940)   doxycycline (VIBRAMYCIN) IV 100 mg (06/16/20 2147)   feeding supplement (VITAL HIGH PROTEIN) 1,000 mL (06/16/20 1556)   fentaNYL infusion INTRAVENOUS 10 mcg/hr (06/16/20 2301)   levofloxacin (LEVAQUIN) IV 500 mg (06/16/20 1713)   PRN Meds:.sodium chloride, fentaNYL, heparin, hydrALAZINE, midazolam, ondansetron **OR** ondansetron (ZOFRAN) IV, sodium chloride flush    REVIEW OF SYSTEMS  PATIENT IS UNABLE TO PROVIDE COMPLETE REVIEW OF SYSTEMS DUE TO SEVERE CRITICAL ILLNESS AND TOXIC METABOLIC ENCEPHALOPATHY   PHYSICAL EXAMINATION:  GENERAL:critically ill appearing,intubated, sedated, not following commands reliably HEAD: Normocephalic, atraumatic. EYES: Pupils equal, round, reactive to light.  No scleral icterus. MOUTH: ETT, OG in place NECK: Supple. Trachea midline PULMONARY: +rhonchi, no wheezing, even CARDIOVASCULAR: S1 and S2.  Regular rate and rhythm. No murmurs, rubs, or gallops. GASTROINTESTINAL: Soft, non-distended. Positive bowel sounds. MUSCULOSKELETAL: No swelling, clubbing, or edema. NEUROLOGIC: More responsive on wake up assessment but not following commands reliably SKIN:intact,warm,dry  ASSESSMENT AND PLAN  ACUTE Hypoxic and Hypercapnic Respiratory Failure -continue Mechanical Ventilator support -continue Bronchodilator Therapy -Wean Fio2 and PEEP as tolerated -Continue VAP/VENT bundle -More responsive during wake up assessment -Not following commands reliably -Tolerating SBT -Hopefully, extubate in the next day or so     SEVERE COPD EXACERBATION -continue IV steroids -continue NEB THERAPY     ACUTE KIDNEY INJURY/Renal Failure -continue Foley Catheter-assess need -Avoid nephrotoxic agents -Follow urine output, BMP -Ensure adequate renal perfusion, optimize oxygenation -Renal dose medications -No HD today per Dr. Holley Raring -Continue Lasix     Acute toxic metabolic encephalopathy Goal  RASS -2 to -3 Precedex     SEPTIC SHOCK -resolved -use vasopressors to keep MAP>65 as needed -follow ABG and LA -follow up cultures -continue ABX   INFECTIOUS DISEASE -continue antibiotics as prescribed -follow up cultures -follow up ID recs   MRSA NEG RVP NEGATIVE COVID NEG INF AB NEG SPUTUM CX NG AFB SMEAR NEG HIV NEG   ENDO - ICU hypoglycemic\Hyperglycemia protocol -check FSBS per protocol     GI GI PROPHYLAXIS as indicated   NUTRITIONAL STATUS DIET-->TF's as tolerated Constipation protocol as indicated     ELECTROLYTES -follow labs as needed -replace as needed -pharmacy consultation and following        Best practice (right click and "Reselect all SmartList Selections" daily)  Diet:  NPO Pain/Anxiety/Delirium protocol (if indicated): Yes (RASS goal -2) VAP protocol (if indicated): Yes DVT prophylaxis: Subcutaneous Heparin GI prophylaxis: H2B Glucose control:  SSI  Yes Central venous access:  N/A Arterial line:  N/A Foley:  N/A Mobility:  bed rest  PT consulted: N/A Code Status:  full code Disposition: ICU   Labs   CBC: Recent Labs  Lab 06/11/20 0449 06/12/20 0435 06/13/20 0438 06/14/20 0304 06/15/20 0349 06/16/20 0509  WBC 10.1 7.5 6.4 9.6 8.4 7.8  NEUTROABS 9.1* 5.7  --  7.8* 6.4 6.1  HGB 10.1* 9.6* 8.7* 8.6* 8.3* 8.6*  HCT 29.4* 28.8* 26.2* 26.1* 25.4* 25.4*  MCV 85.7 87.8 88.8 87.3 88.2 86.4  PLT 167 205 232 313 337 387     Basic Metabolic Panel: Recent Labs  Lab 06/11/20 0449 06/12/20 0435 06/13/20 0438 06/14/20 0304 06/15/20 0349 06/16/20 0509  NA 142 142 142 142 142 146*  K 3.8 3.6 3.7 4.0 4.3 4.8  CL 108 111 113* 109 110 111  CO2 22 22 20* _0 GLUCOSE 255* 214* 188* 222* 199* 204*  BUN 86* 112* 119* 108* 120* 161*  CREATININE 4.98* 6.02* 5.71* 4.67* 5.12* 6.03*  CALCIUM 8.2* 8.0* 7.5* 7.7* 7.7* 8.4*  MG 2.6*  --  2.5* 2.2 2.4 2.7*  PHOS 7.5*  --  7.3* 5.8* 9.6* 11.5*    GFR: Estimated Creatinine Clearance: 14 mL/min (A) (by C-G formula based on SCr of 6.03 mg/dL (H)). Recent Labs  Lab 06/13/20 0438 06/14/20 0304 06/15/20 0349 06/16/20 0509  WBC 6.4 9.6 8.4 7.8     Liver Function Tests: Recent Labs  Lab 06/10/20 0450 06/11/20 0449 06/14/20 0304 06/16/20 0509  ALBUMIN 1.6* 2.4* 1.9* 1.9*    No results for input(s): LIPASE, AMYLASE in the last 168 hours. No results for input(s): AMMONIA in the last 168 hours.  ABG    Component Value Date/Time   PHART 7.33 (L) 06/08/2020 0500   PCO2ART 40 06/08/2020 0500   PO2ART 99 06/08/2020 0500   HCO3 21.1 06/08/2020 0500   ACIDBASEDEF 4.5 (H) 06/08/2020 0500   O2SAT 97.2 06/08/2020 0500      Coagulation Profile: No results for input(s): INR, PROTIME in the last 168 hours.  Cardiac Enzymes: No results for input(s): CKTOTAL, CKMB, CKMBINDEX, TROPONINI in the last 168 hours.   HbA1C: Hgb A1c MFr Bld  Date/Time Value Ref Range Status   06/04/2020 09:48 PM 7.4 (H) 4.8 - 5.6 % Final    Comment:    (NOTE)         Prediabetes: 5.7 - 6.4         Diabetes: >6.4         Glycemic control for adults with diabetes: <7.0  CBG: Recent Labs  Lab 06/16/20 1110 06/16/20 1531 06/16/20 1923 06/16/20 1946 06/16/20 2317  GLUCAP 232* 321* 235* 273* 213*     Allergies No Known Allergies      The patient is critically ill with multiple organ systems failure and requires high complexity decision making for assessment and support, frequent evaluation and titration of therapies, application of advanced monitoring technologies and extensive interpretation of multiple databases. Critical Care Time devoted to patient care services described in this note is 35 minutes.  Renold Don, MD  PCCM   *This note was dictated using voice recognition software/Dragon.  Despite best efforts to proofread, errors can occur which can change the meaning.  Any change was purely unintentional.

## 2020-06-16 NOTE — Progress Notes (Signed)
[place], [place] 06/16/20  Subjective:   Hospital day # 12  Patient remains critically ill at the moment. Creatinine up to 6.03. However urine output was good at 4.4 L over the preceding 24 hours.   Renal: 06/11 0701 - 06/12 0700 In: 3861.8 [I.V.:1381.7; NG/GT:1920; IV Piggyback:530] Out: 0370 [Urine:4450; Stool:325] Lab Results  Component Value Date   CREATININE 6.03 (H) 06/16/2020   CREATININE 5.12 (H) 06/15/2020   CREATININE 4.67 (H) 06/14/2020     Objective:  Vital signs in last 24 hours:  Temp:  [95.7 F (35.4 C)-98.78 F (37.1 C)] 98.7 F (37.1 C) (06/12 1200) Pulse Rate:  [81-109] 109 (06/12 1500) Resp:  [0-24] 19 (06/12 1500) BP: (127-184)/(60-95) 184/95 (06/12 1508) SpO2:  [92 %-97 %] 96 % (06/12 1500) FiO2 (%):  [28 %] 28 % (06/12 1500) Weight:  [97.3 kg-99.8 kg] 97.3 kg (06/12 0715)  Weight change: -2 kg Filed Weights   06/15/20 0359 06/16/20 0500 06/16/20 0715  Weight: 101.8 kg 99.8 kg 97.3 kg    Intake/Output:    Intake/Output Summary (Last 24 hours) at 06/16/2020 1550 Last data filed at 06/16/2020 1400 Gross per 24 hour  Intake 4011.77 ml  Output 3200 ml  Net 811.77 ml     Physical Exam:  General: Critically ill-appearing  HEENT anicteric,  ETT, OG tube  Pulm/lungs coarse breath sounds, ventilator assisted  CVS/Heart regular rhythm, no rubs  Abdomen:  Soft, nontender  Extremities: + dependent peripheral edema  Neurologic: Sedated  Skin:  Warm, dry  Coud catheter in place  Rectal tube in place   Basic Metabolic Panel:  Recent Labs  Lab 06/11/20 0449 06/12/20 0435 06/13/20 0438 06/14/20 0304 06/15/20 0349 06/16/20 0509  NA 142 142 142 142 142 146*  K 3.8 3.6 3.7 4.0 4.3 4.8  CL 108 111 113* 109 110 111  CO2 22 22 20* _0 GLUCOSE 255* 214* 188* 222* 199* 204*  BUN 86* 112* 119* 108* 120* 161*  CREATININE 4.98* 6.02* 5.71* 4.67* 5.12* 6.03*  CALCIUM 8.2* 8.0* 7.5* 7.7* 7.7* 8.4*  MG  2.6*  --  2.5* 2.2 2.4 2.7*  PHOS 7.5*  --  7.3* 5.8* 9.6* 11.5*      CBC: Recent Labs  Lab 06/11/20 0449 06/12/20 0435 06/13/20 0438 06/14/20 0304 06/15/20 0349 06/16/20 0509  WBC 10.1 7.5 6.4 9.6 8.4 7.8  NEUTROABS 9.1* 5.7  --  7.8* 6.4 6.1  HGB 10.1* 9.6* 8.7* 8.6* 8.3* 8.6*  HCT 29.4* 28.8* 26.2* 26.1* 25.4* 25.4*  MCV 85.7 87.8 88.8 87.3 88.2 86.4  PLT 167 205 232 313 337 387       Lab Results  Component Value Date   HEPBSAG NON REACTIVE 06/09/2020   HEPBSAB NON REACTIVE 06/09/2020      Microbiology:  Recent Results (from the past 240 hour(s))  Acid Fast Smear (AFB)     Status: None   Collection Time: 06/06/20  6:30 PM   Specimen: Bronchoalveolar Lavage; Sputum  Result Value Ref Range Status   AFB Specimen Processing Concentration  Final   Acid Fast Smear Negative  Final    Comment: (NOTE) Performed At: [facility] [address] [place], [place] [id number] Rush Farmer MD [id number]    Source (AFB) BRONCHIAL ALVEOLAR LAVAGE  Corrected    Comment: Performed at [facility], [address] [place],  [address] CORRECTED ON 06/02 AT 2239: PREVIOUSLY REPORTED AS SPUTUM   Culture, Respiratory w Gram Stain  Status: None   Collection Time: 06/06/20  6:30 PM   Specimen: Bronchoalveolar Lavage  Result Value Ref Range Status   Specimen Description   Final    BRONCHIAL ALVEOLAR LAVAGE Performed at [facility], [address], [place], [place] [address]    Special Requests   Final    NONE Performed at [facility], [address] [place], [place] [address]    Gram Stain   Final    FEW WBC PRESENT,BOTH PMN AND MONONUCLEAR NO ORGANISMS SEEN    Culture   Final    NO GROWTH 3 DAYS Performed at [facility], [place] [address], [place], [place] [address]    Report Status 06/09/2020 FINAL  Final  Fungus Culture With Stain     Status: None (Preliminary result)   Collection Time: 06/06/20  6:30 PM   Result Value Ref Range Status   Fungus Stain Final report  Final    Comment: (NOTE) Performed At: [facility] [place], [place] [id number] Rush Farmer MD [id number]    Fungus (Mycology) Culture PENDING  Incomplete   Fungal Source BRONCHIAL ALVEOLAR LAVAGE  Final    Comment: Performed at [facility], [place] [address] [place], [place] [address]  Fungus Culture Result     Status: None   Collection Time: 06/06/20  6:30 PM  Result Value Ref Range Status   Result 1 Comment  Final    Comment: (NOTE) KOH/Calcofluor preparation:  no fungus observed. Performed At: [facility] [place], [place] [id number] Rush Farmer MD OX:[id number]   Respiratory (~20 pathogens) panel by PCR     Status: None   Collection Time: 06/07/20  5:50 AM   Specimen: Nasopharyngeal Swab; Respiratory  Result Value Ref Range Status   Adenovirus NOT DETECTED NOT DETECTED Final   Coronavirus 229E NOT DETECTED NOT DETECTED Final    Comment: (NOTE) The Coronavirus on the Respiratory Panel, DOES NOT test for the novel  Coronavirus (2019 nCoV)    Coronavirus HKU1 NOT DETECTED NOT DETECTED Final   Coronavirus NL63 NOT DETECTED NOT DETECTED Final   Coronavirus OC43 NOT DETECTED NOT DETECTED Final   Metapneumovirus NOT DETECTED NOT DETECTED Final   Rhinovirus / Enterovirus NOT DETECTED NOT DETECTED Final   Influenza A NOT DETECTED NOT DETECTED Final   Influenza B NOT DETECTED NOT DETECTED Final   Parainfluenza Virus 1 NOT DETECTED NOT DETECTED Final   Parainfluenza Virus 2 NOT DETECTED NOT DETECTED Final   Parainfluenza Virus 3 NOT DETECTED NOT DETECTED Final   Parainfluenza Virus 4 NOT DETECTED NOT DETECTED Final   Respiratory Syncytial Virus NOT DETECTED NOT DETECTED Final   Bordetella pertussis NOT DETECTED NOT DETECTED Final   Bordetella Parapertussis NOT DETECTED NOT DETECTED Final   Chlamydophila pneumoniae NOT DETECTED NOT DETECTED Final   Mycoplasma  pneumoniae NOT DETECTED NOT DETECTED Final    Comment: Performed at [facility], [place]. [address], [place],  [address]    Coagulation Studies: No results for input(s): LABPROT, INR in the last 72 hours.  Urinalysis: No results for input(s): COLORURINE, LABSPEC, PHURINE, GLUCOSEU, HGBUR, BILIRUBINUR, KETONESUR, PROTEINUR, UROBILINOGEN, NITRITE, LEUKOCYTESUR in the last 72 hours.  Invalid input(s): APPERANCEUR    Imaging: DG Chest 1 View  Result Date: 06/15/2020 CLINICAL DATA:  Sepsis. EXAM: CHEST  1 VIEW COMPARISON:  06/13/2020 FINDINGS: Endotracheal tube, esophageal T age probe, left IJ central venous catheter and right-sided PICC line unchanged. Nasogastric tube courses  into the region of the stomach and off the film as tip is not visualized. Patient is slightly rotated to the left. Lungs are adequately inflated demonstrate persistent hazy opacification over the left mid to lower lung likely moderate size effusion with associated basilar atelectasis. Mild hazy prominence of the central pulmonary vessels likely associated degree of vascular congestion. Cardiomediastinal silhouette and remainder of the exam is unchanged. IMPRESSION: Stable hazy opacification over the left mid to lower lung likely moderate size effusion with associated basilar atelectasis. Infection over the left mid to lower lung is possible. Suggestion of mild vascular congestion. Electronically Signed   By: Marin Olp M.D.   On: 06/15/2020 12:57      Medications:    sodium chloride 10 mL/hr at 06/16/20 0700   dexmedetomidine (PRECEDEX) IV infusion 0.8 mcg/kg/hr (06/16/20 1542)   doxycycline (VIBRAMYCIN) IV 100 mg (06/16/20 1006)   feeding supplement (VITAL HIGH PROTEIN) 1,000 mL (06/15/20 2056)   fentaNYL infusion INTRAVENOUS Stopped (06/16/20 0734)   levofloxacin (LEVAQUIN) IV Stopped (06/14/20 1821)    amLODipine  5 mg Per Tube Daily   budesonide (PULMICORT) nebulizer solution  0.5 mg Nebulization BID    chlorhexidine gluconate (MEDLINE KIT)  15 mL Mouth Rinse BID   Chlorhexidine Gluconate Cloth  6 each Topical Daily   cloNIDine  0.2 mg Transdermal Weekly   diazepam  5 mg Per Tube Q6H   feeding supplement (PROSource TF)  45 mL Per Tube Daily   free water  100 mL Per Tube Q4H   furosemide  40 mg Intravenous Daily   heparin injection (subcutaneous)  5,000 Units Subcutaneous Q8H   insulin aspart  0-20 Units Subcutaneous Q4H   insulin aspart  5 Units Subcutaneous Q4H   insulin glargine  15 Units Subcutaneous Daily   ipratropium-albuterol  3 mL Nebulization Q4H   labetalol  200 mg Per Tube BID   mouth rinse  15 mL Mouth Rinse 10 times per day   methylPREDNISolone (SOLU-MEDROL) injection  20 mg Intravenous Daily   multivitamin  1 tablet Per Tube QHS   oxyCODONE  10 mg Per Tube Q6H   pantoprazole (PROTONIX) IV  40 mg Intravenous Q24H   sodium bicarbonate  1,300 mg Per Tube BID   sodium chloride flush  10-40 mL Intracatheter Q12H   traZODone  150 mg Per Tube QHS   sodium chloride, fentaNYL, heparin, hydrALAZINE, midazolam, ondansetron **OR** ondansetron (ZOFRAN) IV, sodium chloride flush  Assessment/ Plan:  59 y.o. male with  medical problems of   Hypertension, PTSD, diabetes, migraines, GERD, neurogenic bladder secondary to remote history of transverse myelitis, requires self-catheterization and has frequent UTIs, opioids for chronic pain  admitted on 06/04/2020 for Sepsis ([facility]) [A41.9] Altered mental status, unspecified altered mental status type [R41.82] Community acquired pneumonia, unspecified laterality [J18.9]   #Acute kidney injury, proteinuria, hematuria Baseline creatinine of 1.26 at admission Increasing creatinine trends since June 3. AKI is likely multifactorial secondary to ongoing sepsis as well as IV contrast exposure on June 3 Urinalysis at admission showed glucosuria, greater than 300 proteinuria, 6-10 RBCs, moderate hemoglobin noted on dipstick, 11-20 WBCs Renal  imaging as noted above.  Patient had CT abdomen pelvis without contrast.  No hydronephrosis.  Bladder was unremarkable. UPC 2.55 gm RBC 21-50 cc June 09, 2020: ANA negative, ESR normal, complement C3-C4 normal, kappa lambda ratio normal, hepatitis B and hepatitis C studies negative, HIV negative, crypto antigen negative Hemodialysis started on June 6, had treatment on 6/6 and 6/9.  Plan: Patient had good urine output of 4.4 L yesterday.  However creatinine worsening.  We will likely plan for hemodialysis treatment again tomorrow given worsening azotemia.   #Acute respiratory failure Patient has had good urine output over the past 2 days in an effort to help wean the patient from the ventilator   #Dependent peripheral edema Hold off on additional Lasix at the moment.    LOS: 12 Amanda Steuart 6/12/20223:50 PM  [place], [place]  Note: This note was prepared with Dragon dictation. Any transcription errors are unintentional

## 2020-06-17 LAB — MISC LABCORP TEST (SEND OUT): Labcorp test code: 138412

## 2020-06-17 LAB — CBC WITH DIFFERENTIAL/PLATELET
Abs Immature Granulocytes: 0.03 10*3/uL (ref 0.00–0.07)
Basophils Absolute: 0 10*3/uL (ref 0.0–0.1)
Basophils Relative: 0 %
Eosinophils Absolute: 0.1 10*3/uL (ref 0.0–0.5)
Eosinophils Relative: 1 %
HCT: 24.7 % — ABNORMAL LOW (ref 39.0–52.0)
Hemoglobin: 8.2 g/dL — ABNORMAL LOW (ref 13.0–17.0)
Immature Granulocytes: 0 %
Lymphocytes Relative: 13 %
Lymphs Abs: 1 10*3/uL (ref 0.7–4.0)
MCH: 29 pg (ref 26.0–34.0)
MCHC: 33.2 g/dL (ref 30.0–36.0)
MCV: 87.3 fL (ref 80.0–100.0)
Monocytes Absolute: 0.6 10*3/uL (ref 0.1–1.0)
Monocytes Relative: 8 %
Neutro Abs: 5.7 10*3/uL (ref 1.7–7.7)
Neutrophils Relative %: 78 %
Platelets: 369 10*3/uL (ref 150–400)
RBC: 2.83 MIL/uL — ABNORMAL LOW (ref 4.22–5.81)
RDW: 16 % — ABNORMAL HIGH (ref 11.5–15.5)
WBC: 7.3 10*3/uL (ref 4.0–10.5)
nRBC: 0 % (ref 0.0–0.2)

## 2020-06-17 LAB — BASIC METABOLIC PANEL
Anion gap: 14 (ref 5–15)
BUN: 185 mg/dL — ABNORMAL HIGH (ref 8–23)
CO2: 22 mmol/L (ref 22–32)
Calcium: 8.5 mg/dL — ABNORMAL LOW (ref 8.9–10.3)
Chloride: 110 mmol/L (ref 98–111)
Creatinine, Ser: 6.06 mg/dL — ABNORMAL HIGH (ref 0.61–1.24)
GFR, Estimated: 10 mL/min — ABNORMAL LOW (ref >60)
Glucose, Bld: 172 mg/dL — ABNORMAL HIGH (ref 70–99)
Potassium: 5.4 mmol/L — ABNORMAL HIGH (ref 3.5–5.1)
Sodium: 146 mmol/L — ABNORMAL HIGH (ref 135–145)

## 2020-06-17 LAB — GLUCOSE, CAPILLARY
Glucose-Capillary: 158 mg/dL — ABNORMAL HIGH (ref 70–99)
Glucose-Capillary: 160 mg/dL — ABNORMAL HIGH (ref 70–99)
Glucose-Capillary: 175 mg/dL — ABNORMAL HIGH (ref 70–99)
Glucose-Capillary: 184 mg/dL — ABNORMAL HIGH (ref 70–99)
Glucose-Capillary: 191 mg/dL — ABNORMAL HIGH (ref 70–99)
Glucose-Capillary: 206 mg/dL — ABNORMAL HIGH (ref 70–99)
Glucose-Capillary: 209 mg/dL — ABNORMAL HIGH (ref 70–99)

## 2020-06-17 LAB — MAGNESIUM: Magnesium: 2.9 mg/dL — ABNORMAL HIGH (ref 1.7–2.4)

## 2020-06-17 LAB — PHOSPHORUS: Phosphorus: 12.1 mg/dL — ABNORMAL HIGH (ref 2.5–4.6)

## 2020-06-17 MED ORDER — IPRATROPIUM-ALBUTEROL 0.5-2.5 (3) MG/3ML IN SOLN
3.0000 mL | Freq: Four times a day (QID) | RESPIRATORY_TRACT | Status: DC
  Administered 2020-06-18 – 2020-06-20 (×10): 3 mL via RESPIRATORY_TRACT
  Filled 2020-06-17 (×10): qty 3

## 2020-06-17 MED ORDER — HEPARIN SODIUM (PORCINE) 1000 UNIT/ML IJ SOLN
2600.0000 [IU] | Freq: Once | INTRAMUSCULAR | Status: AC
  Administered 2020-06-17: 2600 [IU]

## 2020-06-17 NOTE — Progress Notes (Signed)
NAME:  Wyatt Ball., MRN:  [id number], DOB:  [birth year], LOS: 18 ADMISSION DATE:  06/04/2020 60 y.o. male with medical history significant for HTN, PTSD, diabetes, migraines, GERD, neurogenic bladder secondary to remote history of transverse myelitis, who self catheterizes and has frequent UTIs as well as chronic back pain on chronic opiates who at baseline is independent - brought to the ER with a 3-day history of altered mental status.   + cough, shortness of breath and fever as well as vomiting and diarrhea and started becoming increasingly confused in the 24 hours prior to arrival.       Unable to get any meaningful history from patient.   ED course: On arrival, temperature 99.3, BP 167/67, pulse 99, respirations 18 with O2 sat 96% on room air.  Blood work significant for leukocytosis of 13,600 with lactic acid of 1.2.  Ammonia level normal at 15, EtOH less than 10.  Sodium 130, potassium 3.2, glucose 249 creatinine 1.06.  Urinalysis with 80 ketones and rare bacteria.  UDS positive for opiates and tricyclic's.  COVID and flu negative       Imaging:   CT chest abdomen and pelvis without contrast consistent with pneumonia.  Multiple nodules left upper lobe likely infectious or inflammatory with follow-up imaging recommended to document resolution.   Cardiomegaly with trace pericardial effusion   Patient was started on sepsis fluids and broad-spectrum antibiotics initially for sepsis of unknown source.       Significant Hospital Events: Including procedures, antibiotic start and stop dates in addition to other pertinent events   5/31 admitted for sepsis present on admission for Left lung pneumonia 6/1 progressive confusion and hypoxia 6/2 transferred to ICU, INTUBATED, S/P BRONCH 6/3 high fevers, remains ill, SELF EXTUBATED 6/3 emergently re-intubated 6/5 failure to wean from v ent 6/6 failure to wean from vent, VASC CATH PLACED 6/7 HD started remains critically ill 6/8  remains on vent 6/9 waiting for family for SAT 06/17/2020-patient had dialysis today without acute events.  He passed spontaneous breathing trial today and plan is to liberate from ventilator.  Care plan reviewed with infectious disease today.      CBC    Component Value Date/Time   WBC 7.3 06/17/2020 0537   RBC 2.83 (L) 06/17/2020 0537   HGB 8.2 (L) 06/17/2020 0537   HCT 24.7 (L) 06/17/2020 0537   PLT 369 06/17/2020 0537   MCV 87.3 06/17/2020 0537   MCH 29.0 06/17/2020 0537   MCHC 33.2 06/17/2020 0537   RDW 16.0 (H) 06/17/2020 0537   LYMPHSABS 1.0 06/17/2020 0537   MONOABS 0.6 06/17/2020 0537   EOSABS 0.1 06/17/2020 0537   BASOSABS 0.0 06/17/2020 0537     BMP Latest Ref Rng & Units 06/17/2020 06/16/2020 06/15/2020  Glucose 70 - 99 mg/dL 172(H) 204(H) 199(H)  BUN 8 - 23 mg/dL 185(H) 161(H) 120(H)  Creatinine 0.61 - 1.24 mg/dL 6.06(H) 6.03(H) 5.12(H)  Sodium 135 - 145 mmol/L 146(H) 146(H) 142  Potassium 3.5 - 5.1 mmol/L 5.4(H) 4.8 4.3  Chloride 98 - 111 mmol/L 110 111 110  CO2 22 - 32 mmol/L 22 22 22   Calcium 8.9 - 10.3 mg/dL 8.5(L) 8.4(L) 7.7(L)       Objective   Blood pressure 128/61, pulse 73, temperature (!) 97.1 F (36.2 C), temperature source Axillary, resp. rate 19, height 5' 6"  (1.676 m), weight 97.3 kg, SpO2 94 %.    Vent Mode: PRVC FiO2 (%):  [28 %] 28 % Set Rate:  [24  bmp] 24 bmp Vt Set:  [520 mL] 520 mL PEEP:  [5 cmH20] 5 cmH20 Pressure Support:  [8 cmH20] 8 cmH20 Plateau Pressure:  [16 cmH20-18 cmH20] 18 cmH20   Intake/Output Summary (Last 24 hours) at 06/17/2020 0837 Last data filed at 06/17/2020 0700 Gross per 24 hour  Intake 3532.5 ml  Output 3600 ml  Net -67.5 ml    Filed Weights   06/15/20 0359 06/16/20 0500 06/16/20 0715  Weight: 101.8 kg 99.8 kg 97.3 kg   Scheduled Meds:  amLODipine  5 mg Per Tube Daily   budesonide (PULMICORT) nebulizer solution  0.5 mg Nebulization BID   chlorhexidine gluconate (MEDLINE KIT)  15 mL Mouth Rinse BID    Chlorhexidine Gluconate Cloth  6 each Topical Daily   cloNIDine  0.2 mg Transdermal Weekly   diazepam  5 mg Per Tube Q6H   feeding supplement (PROSource TF)  45 mL Per Tube Daily   free water  100 mL Per Tube Q4H   furosemide  40 mg Intravenous Daily   heparin injection (subcutaneous)  5,000 Units Subcutaneous Q8H   insulin aspart  0-20 Units Subcutaneous Q4H   insulin aspart  5 Units Subcutaneous Q4H   insulin glargine  20 Units Subcutaneous Daily   ipratropium-albuterol  3 mL Nebulization Q4H   labetalol  200 mg Per Tube BID   mouth rinse  15 mL Mouth Rinse 10 times per day   methylPREDNISolone (SOLU-MEDROL) injection  20 mg Intravenous Daily   multivitamin  1 tablet Per Tube QHS   oxyCODONE  10 mg Per Tube Q6H   pantoprazole (PROTONIX) IV  40 mg Intravenous Q24H   sodium bicarbonate  1,300 mg Per Tube BID   sodium chloride flush  10-40 mL Intracatheter Q12H   traZODone  150 mg Per Tube QHS   Continuous Infusions:  sodium chloride 5 mL/hr at 06/17/20 0700   dexmedetomidine (PRECEDEX) IV infusion 0.4 mcg/kg/hr (06/17/20 0824)   doxycycline (VIBRAMYCIN) IV Stopped (06/16/20 2347)   feeding supplement (VITAL HIGH PROTEIN) 1,000 mL (06/16/20 1556)   fentaNYL infusion INTRAVENOUS Stopped (06/17/20 0757)   levofloxacin (LEVAQUIN) IV Stopped (06/16/20 1919)   PRN Meds:.sodium chloride, fentaNYL, heparin, hydrALAZINE, midazolam, ondansetron **OR** ondansetron (ZOFRAN) IV, sodium chloride flush    REVIEW OF SYSTEMS  PATIENT IS UNABLE TO PROVIDE COMPLETE REVIEW OF SYSTEMS DUE TO SEVERE CRITICAL ILLNESS AND TOXIC METABOLIC ENCEPHALOPATHY   PHYSICAL EXAMINATION:  GENERAL:critically ill appearing,intubated, sedated HEAD: Normocephalic, atraumatic. EYES: Pupils equal, round, reactive to light.  No scleral icterus. MOUTH: ETT, OG in place NECK: Supple. Trachea midline dialysis catheter on left neck wrapped without exudation or oozing of blood with clean dressing PULMONARY: Mechanical  ventilator audible respiratory motion laterally CARDIOVASCULAR: S1 and S2. Regular rate and rhythm. No murmurs, rubs, or gallops. GASTROINTESTINAL: Soft, non-distended. Positive bowel sounds. MUSCULOSKELETAL: No swelling, clubbing, or edema. NEUROLOGIC: sedated SKIN:intact,warm,dry  ASSESSMENT AND PLAN  ACUTE Hypoxic and Hypercapnic Respiratory Failure -continue Mechanical Ventilator support -continue Bronchodilator Therapy -Wean Fio2 and PEEP as tolerated -Continue VAP/VENT bundle -will perform SAT/SBT when respiratory parameters are met -too sedated for SBT -weaning sedatives     SEVERE COPD EXACERBATION -continue IV steroids -continue NEB THERAPY     ACUTE KIDNEY INJURY/Renal Failure -continue Foley Catheter-assess need -Avoid nephrotoxic agents -Follow urine output, BMP -Ensure adequate renal perfusion, optimize oxygenation -Renal dose medications -No HD today per Dr. Holley Raring     Acute toxic metabolic encephalopathy Goal RASS -2 to -3 Precedex     SEPTIC  SHOCK -resolved -use vasopressors to keep MAP>65 as needed -follow ABG and LA -follow up cultures -continueABX   INFECTIOUS DISEASE -continue antibiotics as prescribed -follow up cultures -follow up ID recs   MRSA NEG RVP NEGATIVE COVID NEG INF AB NEG SPUTUM CX NG AFB SMEAR NEG HIV NEG   ENDO - ICU hypoglycemic\Hyperglycemia protocol -check FSBS per protocol     GI GI PROPHYLAXIS as indicated   NUTRITIONAL STATUS DIET-->TF's as tolerated Constipation protocol as indicated     ELECTROLYTES -follow labs as needed -replace as needed -pharmacy consultation and following   MAR reviewed with clinical pharmacist     Best practice (right click and "Reselect all SmartList Selections" daily)  Diet:  NPO Pain/Anxiety/Delirium protocol (if indicated): Yes (RASS goal -2) VAP protocol (if indicated): Yes DVT prophylaxis: Subcutaneous Heparin GI prophylaxis: H2B Glucose control:  SSI  Yes Central venous access:  N/A Arterial line:  N/A Foley:  N/A Mobility:  bed rest  PT consulted: N/A Code Status:  full code Disposition: ICU   Labs   CBC: Recent Labs  Lab 06/12/20 0435 06/13/20 0438 06/14/20 0304 06/15/20 0349 06/16/20 0509 06/17/20 0537  WBC 7.5 6.4 9.6 8.4 7.8 7.3  NEUTROABS 5.7  --  7.8* 6.4 6.1 5.7  HGB 9.6* 8.7* 8.6* 8.3* 8.6* 8.2*  HCT 28.8* 26.2* 26.1* 25.4* 25.4* 24.7*  MCV 87.8 88.8 87.3 88.2 86.4 87.3  PLT 205 232 313 337 387 369     Basic Metabolic Panel: Recent Labs  Lab 06/13/20 0438 06/14/20 0304 06/15/20 0349 06/16/20 0509 06/17/20 0537  NA 142 142 142 146* 146*  K 3.7 4.0 4.3 4.8 5.4*  CL 113* 109 110 111 110  CO2 20* 22 22 22 22   GLUCOSE 188* 222* 199* 204* 172*  BUN 119* 108* 120* 161* 185*  CREATININE 5.71* 4.67* 5.12* 6.03* 6.06*  CALCIUM 7.5* 7.7* 7.7* 8.4* 8.5*  MG 2.5* 2.2 2.4 2.7* 2.9*  PHOS 7.3* 5.8* 9.6* 11.5* 12.1*    GFR: Estimated Creatinine Clearance: 14 mL/min (A) (by C-G formula based on SCr of 6.06 mg/dL (H)). Recent Labs  Lab 06/14/20 0304 06/15/20 0349 06/16/20 0509 06/17/20 0537  WBC 9.6 8.4 7.8 7.3     Liver Function Tests: Recent Labs  Lab 06/11/20 0449 06/14/20 0304 06/16/20 0509  ALBUMIN 2.4* 1.9* 1.9*    No results for input(s): LIPASE, AMYLASE in the last 168 hours. No results for input(s): AMMONIA in the last 168 hours.  ABG    Component Value Date/Time   PHART 7.33 (L) 06/08/2020 0500   PCO2ART 40 06/08/2020 0500   PO2ART 99 06/08/2020 0500   HCO3 21.1 06/08/2020 0500   ACIDBASEDEF 4.5 (H) 06/08/2020 0500   O2SAT 97.2 06/08/2020 0500      Coagulation Profile: No results for input(s): INR, PROTIME in the last 168 hours.  Cardiac Enzymes: No results for input(s): CKTOTAL, CKMB, CKMBINDEX, TROPONINI in the last 168 hours.   HbA1C: Hgb A1c MFr Bld  Date/Time Value Ref Range Status  06/04/2020 09:48 PM 7.4 (H) 4.8 - 5.6 % Final    Comment:    (NOTE)          Prediabetes: 5.7 - 6.4         Diabetes: >6.4         Glycemic control for adults with diabetes: <7.0     CBG: Recent Labs  Lab 06/16/20 1946 06/16/20 2317 06/16/20 2351 06/17/20 0317 06/17/20 0759  GLUCAP 273* 213* 226* 184* 158*  Allergies No Known Allergies      The patient is critically ill with multiple organ systems failure and requires high complexity decision making for assessment and support, frequent evaluation and titration of therapies, application of advanced monitoring technologies and extensive interpretation of multiple databases. Critical Care Time devoted to patient care services described in this note is 35 minutes.   Ottie Glazier, M.D.  [facility] & [place]    *This note was dictated using voice recognition software/Dragon.  Despite best efforts to proofread, errors can occur which can change the meaning.  Any change was purely unintentional.

## 2020-06-17 NOTE — Progress Notes (Signed)
  SHIFT SUMMARY  0800 Fentanyl and Precedex turned off, patient unable to follow commands on intial assessment, patient placed on 5/5 PS by RT Vitals stable throughout morning  Patient received hemodialysis at the bedside this afternoon   1830 Patient became hypertensive, tachypneic-RR in the 30's, tachycardic-HR in the 130's, unable to follow commands, turning head from side to side with signs of labored breathing. Patient given 25 mcg Fentanyl bolus, PRN versed with no improvement.  Continuous Fentanyl and Precedex gtt restarted at minimal doses (see MAR). RT called to bedside. Patient placed back on PRVC.

## 2020-06-17 NOTE — Progress Notes (Signed)
NAME:  Wyatt Chelf., MRN:  [id number], DOB:  05-16-1960, LOS: 20 ADMISSION DATE:  06/04/2020 60 y.o. male with medical history significant for HTN, PTSD, diabetes, migraines, GERD, neurogenic bladder secondary to remote history of transverse myelitis, who self catheterizes and has frequent UTIs as well as chronic back pain on chronic opiates who at baseline is independent - brought to the ER with a 3-day history of altered mental status.   + cough, shortness of breath and fever as well as vomiting and diarrhea and started becoming increasingly confused in the 24 hours prior to arrival.       Unable to get any meaningful history from patient.   ED course: On arrival, temperature 99.3, BP 167/67, pulse 99, respirations 18 with O2 sat 96% on room air.  Blood work significant for leukocytosis of 13,600 with lactic acid of 1.2.  Ammonia level normal at 15, EtOH less than 10.  Sodium 130, potassium 3.2, glucose 249 creatinine 1.06.  Urinalysis with 80 ketones and rare bacteria.  UDS positive for opiates and tricyclic's.  COVID and flu negative       Imaging:   CT chest abdomen and pelvis without contrast consistent with pneumonia.  Multiple nodules left upper lobe likely infectious or inflammatory with follow-up imaging recommended to document resolution.   Cardiomegaly with trace pericardial effusion   Patient was started on sepsis fluids and broad-spectrum antibiotics initially for sepsis of unknown source.       Significant Hospital Events: Including procedures, antibiotic start and stop dates in addition to other pertinent events   5/31 admitted for sepsis present on admission for Left lung pneumonia 6/1 progressive confusion and hypoxia 6/2 transferred to ICU, INTUBATED, S/P BRONCH 6/3 high fevers, remains ill, SELF EXTUBATED 6/3 emergently re-intubated 6/5 failure to wean from v ent 6/6 failure to wean from vent, VASC CATH PLACED 6/7 HD started remains critically ill 6/8  remains on vent 6/9 waiting for family for SAT 06/17/2020-patient had dialysis today without acute events.  He was unable to pass SBT even post HD due to Care plan reviewed with infectious disease today.      CBC    Component Value Date/Time   WBC 7.3 06/17/2020 0537   RBC 2.83 (L) 06/17/2020 0537   HGB 8.2 (L) 06/17/2020 0537   HCT 24.7 (L) 06/17/2020 0537   PLT 369 06/17/2020 0537   MCV 87.3 06/17/2020 0537   MCH 29.0 06/17/2020 0537   MCHC 33.2 06/17/2020 0537   RDW 16.0 (H) 06/17/2020 0537   LYMPHSABS 1.0 06/17/2020 0537   MONOABS 0.6 06/17/2020 0537   EOSABS 0.1 06/17/2020 0537   BASOSABS 0.0 06/17/2020 0537     BMP Latest Ref Rng & Units 06/17/2020 06/16/2020 06/15/2020  Glucose 70 - 99 mg/dL 172(H) 204(H) 199(H)  BUN 8 - 23 mg/dL 185(H) 161(H) 120(H)  Creatinine 0.61 - 1.24 mg/dL 6.06(H) 6.03(H) 5.12(H)  Sodium 135 - 145 mmol/L 146(H) 146(H) 142  Potassium 3.5 - 5.1 mmol/L 5.4(H) 4.8 4.3  Chloride 98 - 111 mmol/L 110 111 110  CO2 22 - 32 mmol/L _0 Calcium 8.9 - 10.3 mg/dL 8.5(L) 8.4(L) 7.7(L)       Objective   Blood pressure (!) 159/67, pulse 96, temperature (!) 97.2 F (36.2 C), temperature source Temporal, resp. rate 17, height _1  (1.676 m), weight 97.3 kg, SpO2 96 %.    Vent Mode: Spontaneous FiO2 (%):  [28 %] 28 % Set Rate:  [24 bmp] 24  bmp Vt Set:  [520 mL] 520 mL PEEP:  [5 cmH20] 5 cmH20 Pressure Support:  [5 cmH20] 5 cmH20   Intake/Output Summary (Last 24 hours) at 06/17/2020 1632 Last data filed at 06/17/2020 1510 Gross per 24 hour  Intake 3701.13 ml  Output 3600 ml  Net 101.13 ml    Filed Weights   06/15/20 0359 06/16/20 0500 06/16/20 0715  Weight: 101.8 kg 99.8 kg 97.3 kg   Scheduled Meds:  amLODipine  5 mg Per Tube Daily   budesonide (PULMICORT) nebulizer solution  0.5 mg Nebulization BID   chlorhexidine gluconate (MEDLINE KIT)  15 mL Mouth Rinse BID   Chlorhexidine Gluconate Cloth  6 each Topical Daily   cloNIDine  0.2 mg  Transdermal Weekly   diazepam  5 mg Per Tube Q6H   feeding supplement (PROSource TF)  45 mL Per Tube Daily   free water  100 mL Per Tube Q4H   furosemide  40 mg Intravenous Daily   heparin injection (subcutaneous)  5,000 Units Subcutaneous Q8H   insulin aspart  0-20 Units Subcutaneous Q4H   insulin aspart  5 Units Subcutaneous Q4H   insulin glargine  20 Units Subcutaneous Daily   ipratropium-albuterol  3 mL Nebulization Q4H   labetalol  200 mg Per Tube BID   mouth rinse  15 mL Mouth Rinse 10 times per day   methylPREDNISolone (SOLU-MEDROL) injection  20 mg Intravenous Daily   multivitamin  1 tablet Per Tube QHS   oxyCODONE  10 mg Per Tube Q6H   pantoprazole (PROTONIX) IV  40 mg Intravenous Q24H   sodium bicarbonate  1,300 mg Per Tube BID   sodium chloride flush  10-40 mL Intracatheter Q12H   traZODone  150 mg Per Tube QHS   Continuous Infusions:  sodium chloride 5 mL/hr at 06/17/20 1400   dexmedetomidine (PRECEDEX) IV infusion Stopped (06/17/20 0854)   doxycycline (VIBRAMYCIN) IV Stopped (06/17/20 1200)   feeding supplement (VITAL HIGH PROTEIN) 1,000 mL (06/16/20 1556)   fentaNYL infusion INTRAVENOUS Stopped (06/17/20 0755)   levofloxacin (LEVAQUIN) IV Stopped (06/16/20 1919)   PRN Meds:.sodium chloride, fentaNYL, heparin, hydrALAZINE, midazolam, ondansetron **OR** ondansetron (ZOFRAN) IV, sodium chloride flush    REVIEW OF SYSTEMS  PATIENT IS UNABLE TO PROVIDE COMPLETE REVIEW OF SYSTEMS DUE TO SEVERE CRITICAL ILLNESS AND TOXIC METABOLIC ENCEPHALOPATHY on MV   PHYSICAL EXAMINATION:  GENERAL:critically ill appearing,intubated, sedated HEAD: Normocephalic, atraumatic. EYES: Pupils equal, round, reactive to light.  No scleral icterus. MOUTH: ETT, OG in place NECK: Supple. Trachea midline dialysis catheter on left neck wrapped without exudation or oozing of blood with clean dressing PULMONARY: Mechanical ventilator audible respiratory motion laterally CARDIOVASCULAR: S1 and  S2. Regular rate and rhythm. No murmurs, rubs, or gallops. GASTROINTESTINAL: Soft, non-distended. Positive bowel sounds. MUSCULOSKELETAL: No swelling, clubbing, or edema. NEUROLOGIC: sedated SKIN:intact,warm,dry  ASSESSMENT AND PLAN  ACUTE Hypoxic and Hypercapnic Respiratory Failure -continue Mechanical Ventilator support -continue Bronchodilator Therapy -Wean Fio2 and PEEP as tolerated -Continue VAP/VENT bundle -will perform SAT/SBT when respiratory parameters are met -too sedated for SBT -weaning sedatives     SEVERE COPD EXACERBATION -continue IV steroids -continue NEB THERAPY     ACUTE KIDNEY INJURY/Renal Failure -continue Foley Catheter-assess need -Avoid nephrotoxic agents -Follow urine output, BMP -Ensure adequate renal perfusion, optimize oxygenation -Renal dose medications - HD today per Dr. Holley Raring     Acute toxic metabolic encephalopathy Goal RASS -0 Precedex     SEPTIC SHOCK -resolved -use vasopressors to keep MAP>65 as needed -follow ABG  and LA -follow up cultures -continueABX   INFECTIOUS DISEASE -continue antibiotics as prescribed -follow up cultures -follow up ID recs   MRSA NEG RVP NEGATIVE COVID NEG INF AB NEG SPUTUM CX NG AFB SMEAR NEG HIV NEG   ENDO - ICU hypoglycemic\Hyperglycemia protocol -check FSBS per protocol     GI GI PROPHYLAXIS as indicated   NUTRITIONAL STATUS DIET-->TF's as tolerated Constipation protocol as indicated     ELECTROLYTES -follow labs as needed -replace as needed -pharmacy consultation and following   MAR reviewed with clinical pharmacist     Best practice (right click and "Reselect all SmartList Selections" daily)  Diet:  NPO Pain/Anxiety/Delirium protocol (if indicated): Yes (RASS goal -2) VAP protocol (if indicated): Yes DVT prophylaxis: Subcutaneous Heparin GI prophylaxis: H2B Glucose control:  SSI Yes Central venous access:  N/A Arterial line:  N/A Foley:  N/A Mobility:  bed rest   PT consulted: N/A Code Status:  full code Disposition: ICU   Labs   CBC: Recent Labs  Lab 06/12/20 0435 06/13/20 0438 06/14/20 0304 06/15/20 0349 06/16/20 0509 06/17/20 0537  WBC 7.5 6.4 9.6 8.4 7.8 7.3  NEUTROABS 5.7  --  7.8* 6.4 6.1 5.7  HGB 9.6* 8.7* 8.6* 8.3* 8.6* 8.2*  HCT 28.8* 26.2* 26.1* 25.4* 25.4* 24.7*  MCV 87.8 88.8 87.3 88.2 86.4 87.3  PLT 205 232 313 337 387 369     Basic Metabolic Panel: Recent Labs  Lab 06/13/20 0438 06/14/20 0304 06/15/20 0349 06/16/20 0509 06/17/20 0537  NA 142 142 142 146* 146*  K 3.7 4.0 4.3 4.8 5.4*  CL 113* 109 110 111 110  CO2 20* _0 GLUCOSE 188* 222* 199* 204* 172*  BUN 119* 108* 120* 161* 185*  CREATININE 5.71* 4.67* 5.12* 6.03* 6.06*  CALCIUM 7.5* 7.7* 7.7* 8.4* 8.5*  MG 2.5* 2.2 2.4 2.7* 2.9*  PHOS 7.3* 5.8* 9.6* 11.5* 12.1*    GFR: Estimated Creatinine Clearance: 14 mL/min (A) (by C-G formula based on SCr of 6.06 mg/dL (H)). Recent Labs  Lab 06/14/20 0304 06/15/20 0349 06/16/20 0509 06/17/20 0537  WBC 9.6 8.4 7.8 7.3     Liver Function Tests: Recent Labs  Lab 06/11/20 0449 06/14/20 0304 06/16/20 0509  ALBUMIN 2.4* 1.9* 1.9*    No results for input(s): LIPASE, AMYLASE in the last 168 hours. No results for input(s): AMMONIA in the last 168 hours.  ABG    Component Value Date/Time   PHART 7.33 (L) 06/08/2020 0500   PCO2ART 40 06/08/2020 0500   PO2ART 99 06/08/2020 0500   HCO3 21.1 06/08/2020 0500   ACIDBASEDEF 4.5 (H) 06/08/2020 0500   O2SAT 97.2 06/08/2020 0500      Coagulation Profile: No results for input(s): INR, PROTIME in the last 168 hours.  Cardiac Enzymes: No results for input(s): CKTOTAL, CKMB, CKMBINDEX, TROPONINI in the last 168 hours.   HbA1C: Hgb A1c MFr Bld  Date/Time Value Ref Range Status  06/04/2020 09:48 PM 7.4 (H) 4.8 - 5.6 % Final    Comment:    (NOTE)         Prediabetes: 5.7 - 6.4         Diabetes: >6.4         Glycemic control for adults with  diabetes: <7.0     CBG: Recent Labs  Lab 06/16/20 2351 06/17/20 0317 06/17/20 0759 06/17/20 1130 06/17/20 1614  GLUCAP 226* 184* 158* 160* 191*     Allergies No Known Allergies  The patient is critically ill with multiple organ systems failure and requires high complexity decision making for assessment and support, frequent evaluation and titration of therapies, application of advanced monitoring technologies and extensive interpretation of multiple databases. Critical Care Time devoted to patient care services described in this note is 35 minutes.   Ottie Glazier, M.D.  Pulmonary & [place]    *This note was dictated using voice recognition software/Dragon.  Despite best efforts to proofread, errors can occur which can change the meaning.  Any change was purely unintentional.

## 2020-06-17 NOTE — Plan of Care (Signed)
?  Problem: Clinical Measurements: ?Goal: Will remain free from infection ?Outcome: Progressing ?Goal: Diagnostic test results will improve ?Outcome: Progressing ?Goal: Respiratory complications will improve ?Outcome: Progressing ?  ?

## 2020-06-17 NOTE — Progress Notes (Signed)
Nutrition Follow-up  DOCUMENTATION CODES:   Obesity unspecified  INTERVENTION:   Continue Vital HP @60ml /hr + ProSource 57m daily via tube   Free water flushes 1022mq 6 hours per MD  Regimen provides 1480kcal/day, 137g/day protein and 18044may of free water   Rena-vit daily via tube   NUTRITION DIAGNOSIS:   Inadequate oral intake related to inability to eat as evidenced by NPO status.  GOAL:   Provide needs based on ASPEN/SCCM guidelines -met with tube feeds   MONITOR:   TF tolerance, Vent status, Labs, I & O's  ASSESSMENT:   [age]o. male with h/o hypertension, PTSD, diabetes, migraines, GERD, neurogenic bladder secondary to remote history of transverse myelitis, requires self-catheterization and has frequent UTIs and opioids for chronic pain who is admitted on 06/04/2020 for CAP and sepsis   Pt remains sedated and ventilated. OGT in place. Pt tolerating tube feeds well at goal rate. Renal values worsening; nephrology following for possible dialysis. Pt undergoing SBTs. Per chart, pt is up ~14lbs since admit. Pt +7.0L on his I & O's. UOP good.   Medications reviewed and include: lasix, heparin, insulin, solu-medrol, rena-vit, oxycodone, protonix, precedex, doxycycline  Labs reviewed: Na 146(H), K 5.4(H), BUN 185(H), creat 6.06(H), P 12.1(H), Mg 2.9(H) Hgb 8.2(L), Hct 24.7(L) cbgs- 184, 158, 160 x 24 hrs  Patient is currently intubated on ventilator support MV: 10.3 L/min Temp (24hrs), Avg:97.8 F (36.6 C), Min:97.1 F (36.2 C), Max:98.7 F (37.1 C)  Propofol: none   MAP- >48m26m UOP- 2975ml31miet Order:    Diet Order             Diet NPO time specified  Diet effective now                  EDUCATION NEEDS:   No education needs have been identified at this time  Skin:  Skin Assessment: Reviewed RN Assessment  Last BM:  6/13- type 7- 400ml 47mght:   Ht Readings from Last 1 Encounters:  06/16/20 5' 6"  (1.676 m)    Weight:   Wt  Readings from Last 1 Encounters:  06/16/20 97.3 kg    Ideal Body Weight:  64.5 kg  BMI:  Body mass index is 34.62 kg/m.  Estimated Nutritional Needs:   Kcal:  1100-1400 kcal/d  Protein:  130-160 g/d  Fluid:  1.9-2.2L/day  Freeman Borba Koleen DistanceD, LDN Please refer to [facility] and/or RD on-call/weekend/after hours pager

## 2020-06-17 NOTE — Progress Notes (Signed)
[facility], [place] 06/17/20  Subjective:   Hospital day # 13  BUN now up to 185.  Cr about the same at 6.  Good UOP noted however azotemia a concern. K also high at 5.4.  We have decided to perform HD today.    Renal: 06/12 0701 - 06/13 0700 In: 3532.5 [I.V.:709.3; NG/GT:2040; IV Piggyback:633.2] Out: 3600 [Urine:2975; Emesis/NG output:225; Stool:400] Lab Results  Component Value Date   CREATININE 6.06 (H) 06/17/2020   CREATININE 6.03 (H) 06/16/2020   CREATININE 5.12 (H) 06/15/2020     Objective:  Vital signs in last 24 hours:  Temp:  [95.7 F (35.4 C)-98.7 F (37.1 C)] 97.1 F (36.2 C) (06/13 0400) Pulse Rate:  [70-109] 73 (06/13 0700) Resp:  [0-24] 19 (06/13 0700) BP: (110-184)/(49-95) 128/61 (06/13 0700) SpO2:  [93 %-97 %] 94 % (06/13 0700) FiO2 (%):  [28 %] 28 % (06/13 0431)  Weight change: -2.5 kg Filed Weights   06/15/20 0359 06/16/20 0500 06/16/20 0715  Weight: 101.8 kg 99.8 kg 97.3 kg    Intake/Output:    Intake/Output Summary (Last 24 hours) at 06/17/2020 0805 Last data filed at 06/17/2020 0700 Gross per 24 hour  Intake 3532.5 ml  Output 3600 ml  Net -67.5 ml     Physical Exam:  General: Critically ill-appearing  HEENT anicteric,  ETT, OG tube  Pulm/lungs coarse breath sounds, ventilator assisted  CVS/Heart regular rhythm, no rubs  Abdomen:  Soft, nontender  Extremities: 1+ peripheral edema  Neurologic: Sedated  Skin: Warm, dry  Coud catheter in place  Rectal tube in place   Basic Metabolic Panel:  Recent Labs  Lab 06/13/20 0438 06/14/20 0304 06/15/20 0349 06/16/20 0509 06/17/20 0537  NA 142 142 142 146* 146*  K 3.7 4.0 4.3 4.8 5.4*  CL 113* 109 110 111 110  CO2 20* 22 22 22 22   GLUCOSE 188* 222* 199* 204* 172*  BUN 119* 108* 120* 161* 185*  CREATININE 5.71* 4.67* 5.12* 6.03* 6.06*  CALCIUM 7.5* 7.7* 7.7* 8.4* 8.5*  MG 2.5* 2.2 2.4 2.7* 2.9*  PHOS 7.3* 5.8* 9.6* 11.5* 12.1*       CBC: Recent Labs  Lab 06/12/20 0435 06/13/20 0438 06/14/20 0304 06/15/20 0349 06/16/20 0509 06/17/20 0537  WBC 7.5 6.4 9.6 8.4 7.8 7.3  NEUTROABS 5.7  --  7.8* 6.4 6.1 5.7  HGB 9.6* 8.7* 8.6* 8.3* 8.6* 8.2*  HCT 28.8* 26.2* 26.1* 25.4* 25.4* 24.7*  MCV 87.8 88.8 87.3 88.2 86.4 87.3  PLT 205 232 313 337 387 369       Lab Results  Component Value Date   HEPBSAG NON REACTIVE 06/09/2020   HEPBSAB NON REACTIVE 06/09/2020      Microbiology:  No results found for this or any previous visit (from the past 240 hour(s)).   Coagulation Studies: No results for input(s): LABPROT, INR in the last 72 hours.  Urinalysis: No results for input(s): COLORURINE, LABSPEC, PHURINE, GLUCOSEU, HGBUR, BILIRUBINUR, KETONESUR, PROTEINUR, UROBILINOGEN, NITRITE, LEUKOCYTESUR in the last 72 hours.  Invalid input(s): APPERANCEUR    Imaging: DG Chest 1 View  Result Date: 06/15/2020 CLINICAL DATA:  Sepsis. EXAM: CHEST  1 VIEW COMPARISON:  06/13/2020 FINDINGS: Endotracheal tube, esophageal T age probe, left IJ central venous catheter and right-sided PICC line unchanged. Nasogastric tube courses into the region of the stomach and off the film as tip is not visualized. Patient is slightly rotated to the left. Lungs are adequately inflated demonstrate persistent hazy opacification over the  left mid to lower lung likely moderate size effusion with associated basilar atelectasis. Mild hazy prominence of the central pulmonary vessels likely associated degree of vascular congestion. Cardiomediastinal silhouette and remainder of the exam is unchanged. IMPRESSION: Stable hazy opacification over the left mid to lower lung likely moderate size effusion with associated basilar atelectasis. Infection over the left mid to lower lung is possible. Suggestion of mild vascular congestion. Electronically Signed   By: Marin Olp M.D.   On: 06/15/2020 12:57      Medications:    sodium chloride 5 mL/hr at  06/17/20 0700   dexmedetomidine (PRECEDEX) IV infusion 1.2 mcg/kg/hr (06/17/20 0700)   doxycycline (VIBRAMYCIN) IV Stopped (06/16/20 2347)   feeding supplement (VITAL HIGH PROTEIN) 1,000 mL (06/16/20 1556)   fentaNYL infusion INTRAVENOUS Stopped (06/17/20 0757)   levofloxacin (LEVAQUIN) IV Stopped (06/16/20 1919)    amLODipine  5 mg Per Tube Daily   budesonide (PULMICORT) nebulizer solution  0.5 mg Nebulization BID   chlorhexidine gluconate (MEDLINE KIT)  15 mL Mouth Rinse BID   Chlorhexidine Gluconate Cloth  6 each Topical Daily   cloNIDine  0.2 mg Transdermal Weekly   diazepam  5 mg Per Tube Q6H   feeding supplement (PROSource TF)  45 mL Per Tube Daily   free water  100 mL Per Tube Q4H   furosemide  40 mg Intravenous Daily   heparin injection (subcutaneous)  5,000 Units Subcutaneous Q8H   insulin aspart  0-20 Units Subcutaneous Q4H   insulin aspart  5 Units Subcutaneous Q4H   insulin glargine  20 Units Subcutaneous Daily   ipratropium-albuterol  3 mL Nebulization Q4H   labetalol  200 mg Per Tube BID   mouth rinse  15 mL Mouth Rinse 10 times per day   methylPREDNISolone (SOLU-MEDROL) injection  20 mg Intravenous Daily   multivitamin  1 tablet Per Tube QHS   oxyCODONE  10 mg Per Tube Q6H   pantoprazole (PROTONIX) IV  40 mg Intravenous Q24H   sodium bicarbonate  1,300 mg Per Tube BID   sodium chloride flush  10-40 mL Intracatheter Q12H   traZODone  150 mg Per Tube QHS   sodium chloride, fentaNYL, heparin, hydrALAZINE, midazolam, ondansetron **OR** ondansetron (ZOFRAN) IV, sodium chloride flush  Assessment/ Plan:  60 y.o. male with  medical problems of   Hypertension, PTSD, diabetes, migraines, GERD, neurogenic bladder secondary to remote history of transverse myelitis, requires self-catheterization and has frequent UTIs, opioids for chronic pain  admitted on 06/04/2020 for Sepsis ([place]) [A41.9] Altered mental status, unspecified altered mental status type [R41.82] Community acquired  pneumonia, unspecified laterality [J18.9]   #Acute kidney injury, proteinuria, hematuria Baseline creatinine of 1.26 at admission Increasing creatinine trends since June 3. AKI is likely multifactorial secondary to ongoing sepsis as well as IV contrast exposure on June 3 Urinalysis at admission showed glucosuria, greater than 300 proteinuria, 6-10 RBCs, moderate hemoglobin noted on dipstick, 11-20 WBCs Renal imaging as noted above.  Patient had CT abdomen pelvis without contrast.  No hydronephrosis.  Bladder was unremarkable. UPC 2.55 gm RBC 21-50 cc June 09, 2020: ANA negative, ESR normal, complement C3-C4 normal, kappa lambda ratio normal, hepatitis B and hepatitis C studies negative, HIV negative, crypto antigen negative Hemodialysis started on June 6, had treatment on 6/6 and 6/9.   Plan: Patient continues to have reasonable urine output however patient has significant azotemia.  As such we will proceed with dialysis session today.  Reassess serum electrolytes tomorrow.   #Acute respiratory failure  We will perform hemodialysis with ultrafiltration in an effort to wean the patient from the ventilator.   #Dependent peripheral edema Hold off on additional Lasix at the moment.  #Hyperkalemia. Dialysis should help to treat.      LOS: 13 Brilyn Tuller 6/13/20228:05 [place], [place]  Note: This note was prepared with Dragon dictation. Any transcription errors are unintentional

## 2020-06-18 ENCOUNTER — Inpatient Hospital Stay: Payer: No Typology Code available for payment source

## 2020-06-18 LAB — CBC WITH DIFFERENTIAL/PLATELET
Abs Immature Granulocytes: 0.04 10*3/uL (ref 0.00–0.07)
Basophils Absolute: 0 10*3/uL (ref 0.0–0.1)
Basophils Relative: 0 %
Eosinophils Absolute: 0 10*3/uL (ref 0.0–0.5)
Eosinophils Relative: 0 %
HCT: 26.1 % — ABNORMAL LOW (ref 39.0–52.0)
Hemoglobin: 9 g/dL — ABNORMAL LOW (ref 13.0–17.0)
Immature Granulocytes: 1 %
Lymphocytes Relative: 11 %
Lymphs Abs: 1 10*3/uL (ref 0.7–4.0)
MCH: 29.5 pg (ref 26.0–34.0)
MCHC: 34.5 g/dL (ref 30.0–36.0)
MCV: 85.6 fL (ref 80.0–100.0)
Monocytes Absolute: 0.6 10*3/uL (ref 0.1–1.0)
Monocytes Relative: 7 %
Neutro Abs: 7.2 10*3/uL (ref 1.7–7.7)
Neutrophils Relative %: 81 %
Platelets: 391 10*3/uL (ref 150–400)
RBC: 3.05 MIL/uL — ABNORMAL LOW (ref 4.22–5.81)
RDW: 15.9 % — ABNORMAL HIGH (ref 11.5–15.5)
WBC: 8.9 10*3/uL (ref 4.0–10.5)
nRBC: 0 % (ref 0.0–0.2)

## 2020-06-18 LAB — BASIC METABOLIC PANEL
Anion gap: 12 (ref 5–15)
BUN: 152 mg/dL — ABNORMAL HIGH (ref 8–23)
CO2: 23 mmol/L (ref 22–32)
Calcium: 8.5 mg/dL — ABNORMAL LOW (ref 8.9–10.3)
Chloride: 108 mmol/L (ref 98–111)
Creatinine, Ser: 5.14 mg/dL — ABNORMAL HIGH (ref 0.61–1.24)
GFR, Estimated: 12 mL/min — ABNORMAL LOW (ref >60)
Glucose, Bld: 135 mg/dL — ABNORMAL HIGH (ref 70–99)
Potassium: 4.9 mmol/L (ref 3.5–5.1)
Sodium: 143 mmol/L (ref 135–145)

## 2020-06-18 LAB — MAGNESIUM: Magnesium: 2.7 mg/dL — ABNORMAL HIGH (ref 1.7–2.4)

## 2020-06-18 LAB — GLUCOSE, CAPILLARY
Glucose-Capillary: 114 mg/dL — ABNORMAL HIGH (ref 70–99)
Glucose-Capillary: 124 mg/dL — ABNORMAL HIGH (ref 70–99)
Glucose-Capillary: 146 mg/dL — ABNORMAL HIGH (ref 70–99)
Glucose-Capillary: 190 mg/dL — ABNORMAL HIGH (ref 70–99)
Glucose-Capillary: 201 mg/dL — ABNORMAL HIGH (ref 70–99)
Glucose-Capillary: 239 mg/dL — ABNORMAL HIGH (ref 70–99)

## 2020-06-18 LAB — PHOSPHORUS: Phosphorus: 11.1 mg/dL — ABNORMAL HIGH (ref 2.5–4.6)

## 2020-06-18 IMAGING — DX DG ABDOMEN 1V
1 series · 1 of 1 positions shown · non-contrast
Comparison: [DATE]

CLINICAL DATA: OG placement

EXAM:
ABDOMEN - 1 VIEW

[abdomen supine]
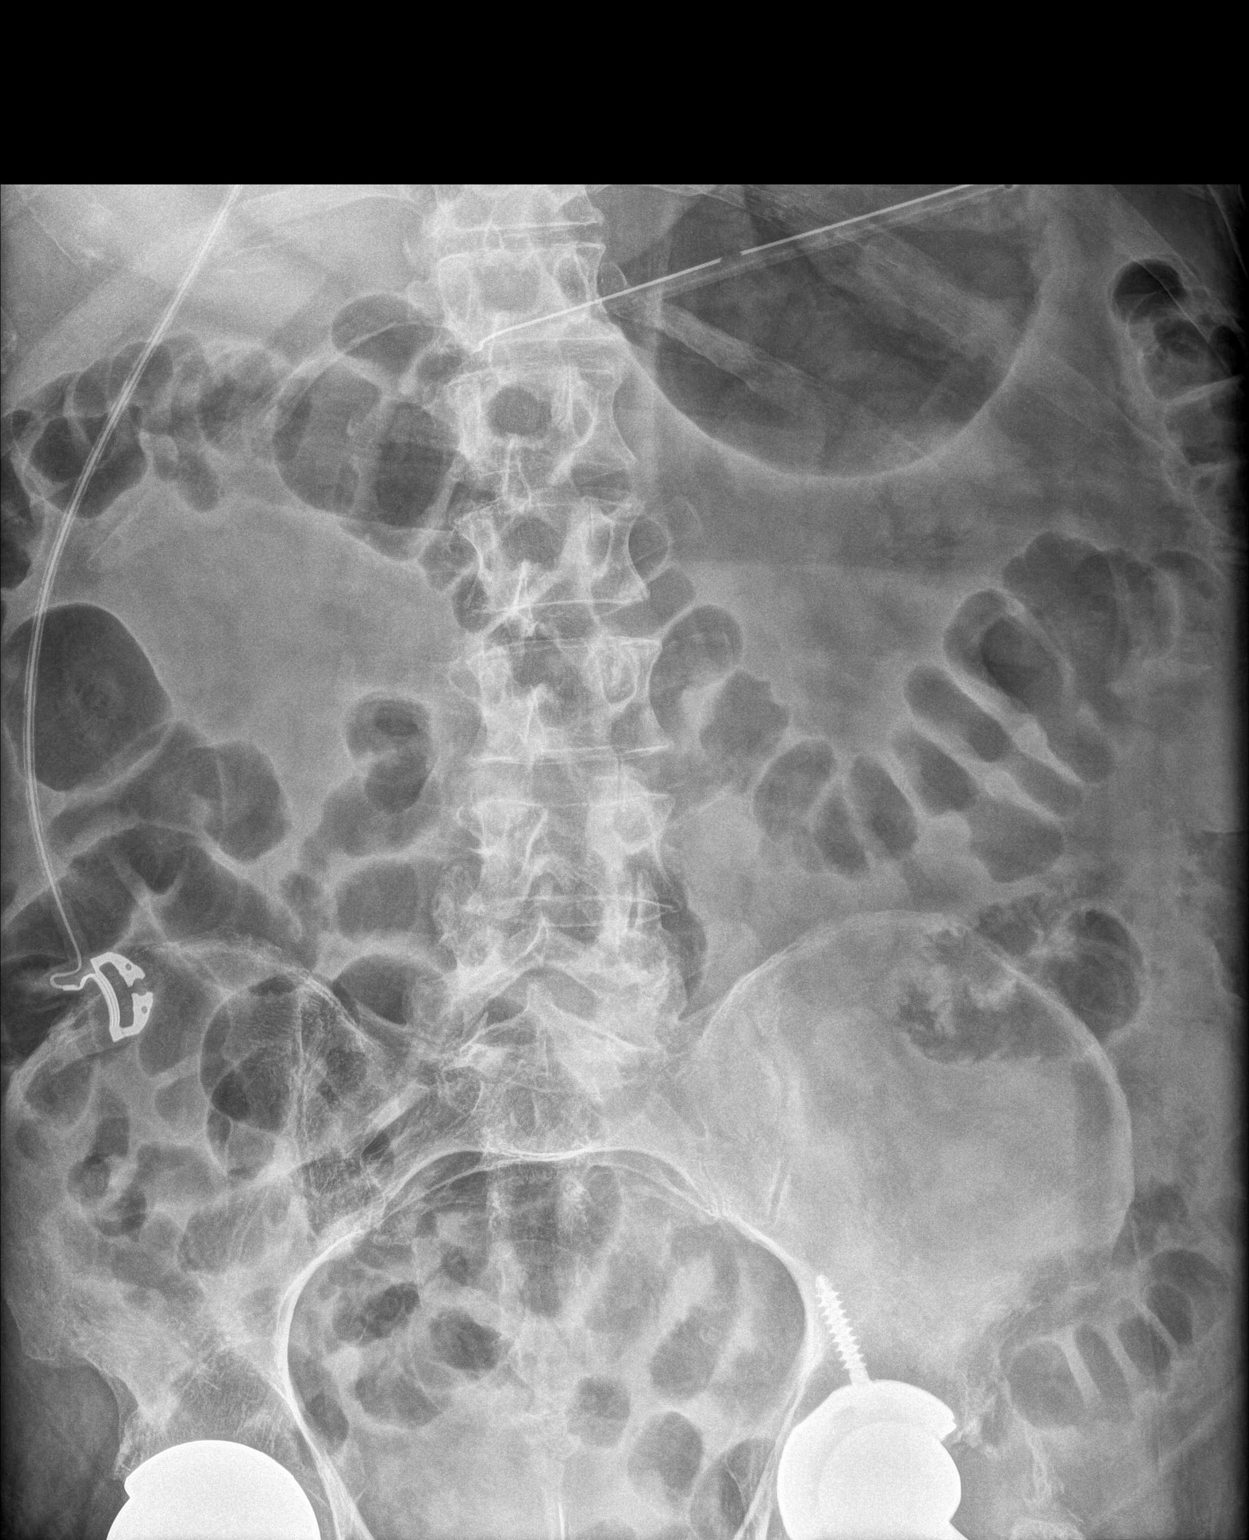

[1 of 1 positions shown; findings below may reference images not displayed]

FINDINGS: Esophageal tube tip and side port overlie the mid to distal stomach.
Mild increased small and large bowel gas without significant
distention. Bilateral hip replacements. No radiopaque calculi.
IMPRESSION: 1. Esophageal tube tip overlies the distal stomach
2. Nonobstructed bowel-gas pattern

## 2020-06-18 IMAGING — DX DG CHEST 1V PORT
2 series · 2 of 2 positions shown · non-contrast
Comparison: Portable exam [NL] hours compared to [DATE]

CLINICAL DATA: Acute respiratory failure

EXAM:
PORTABLE CHEST 1 VIEW

[chest ap (1 of 2)]
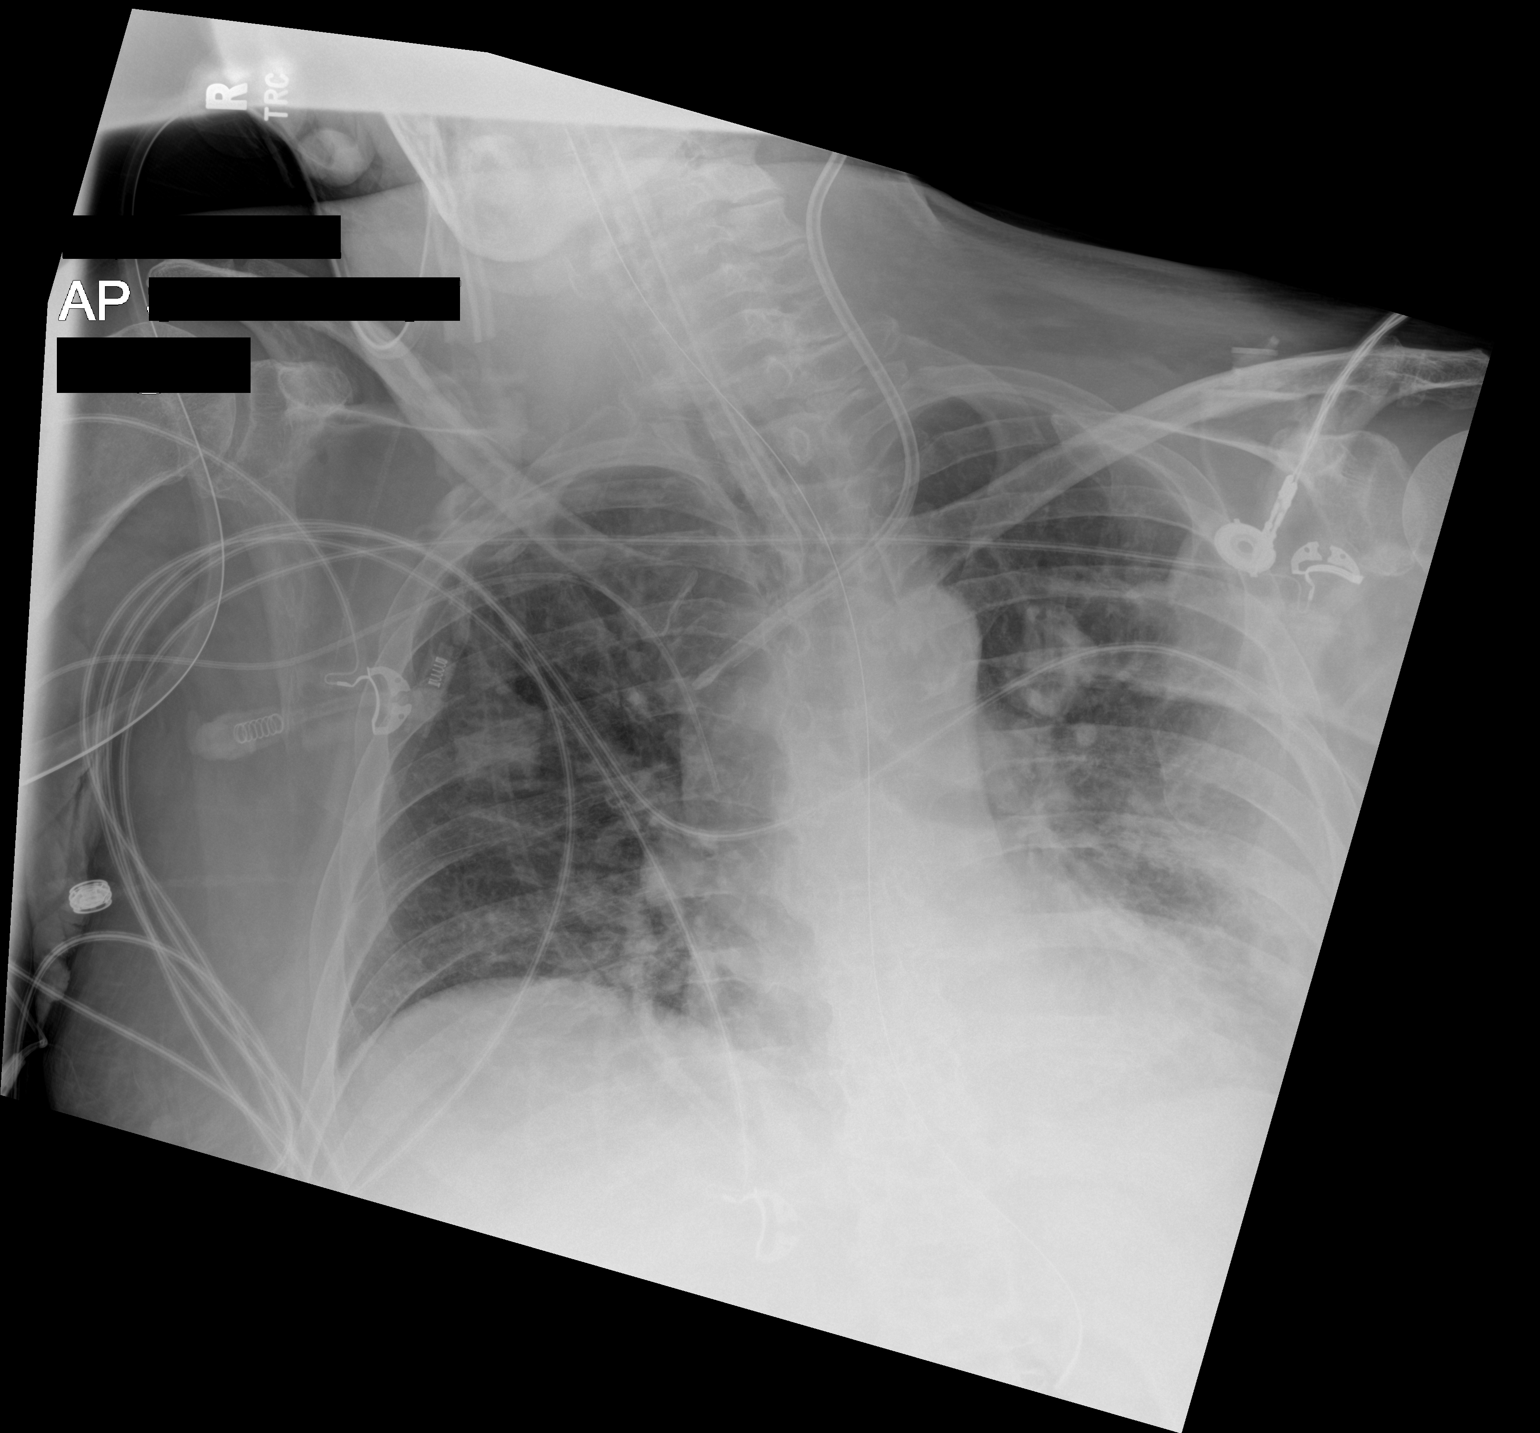

[chest ap (2 of 2)]
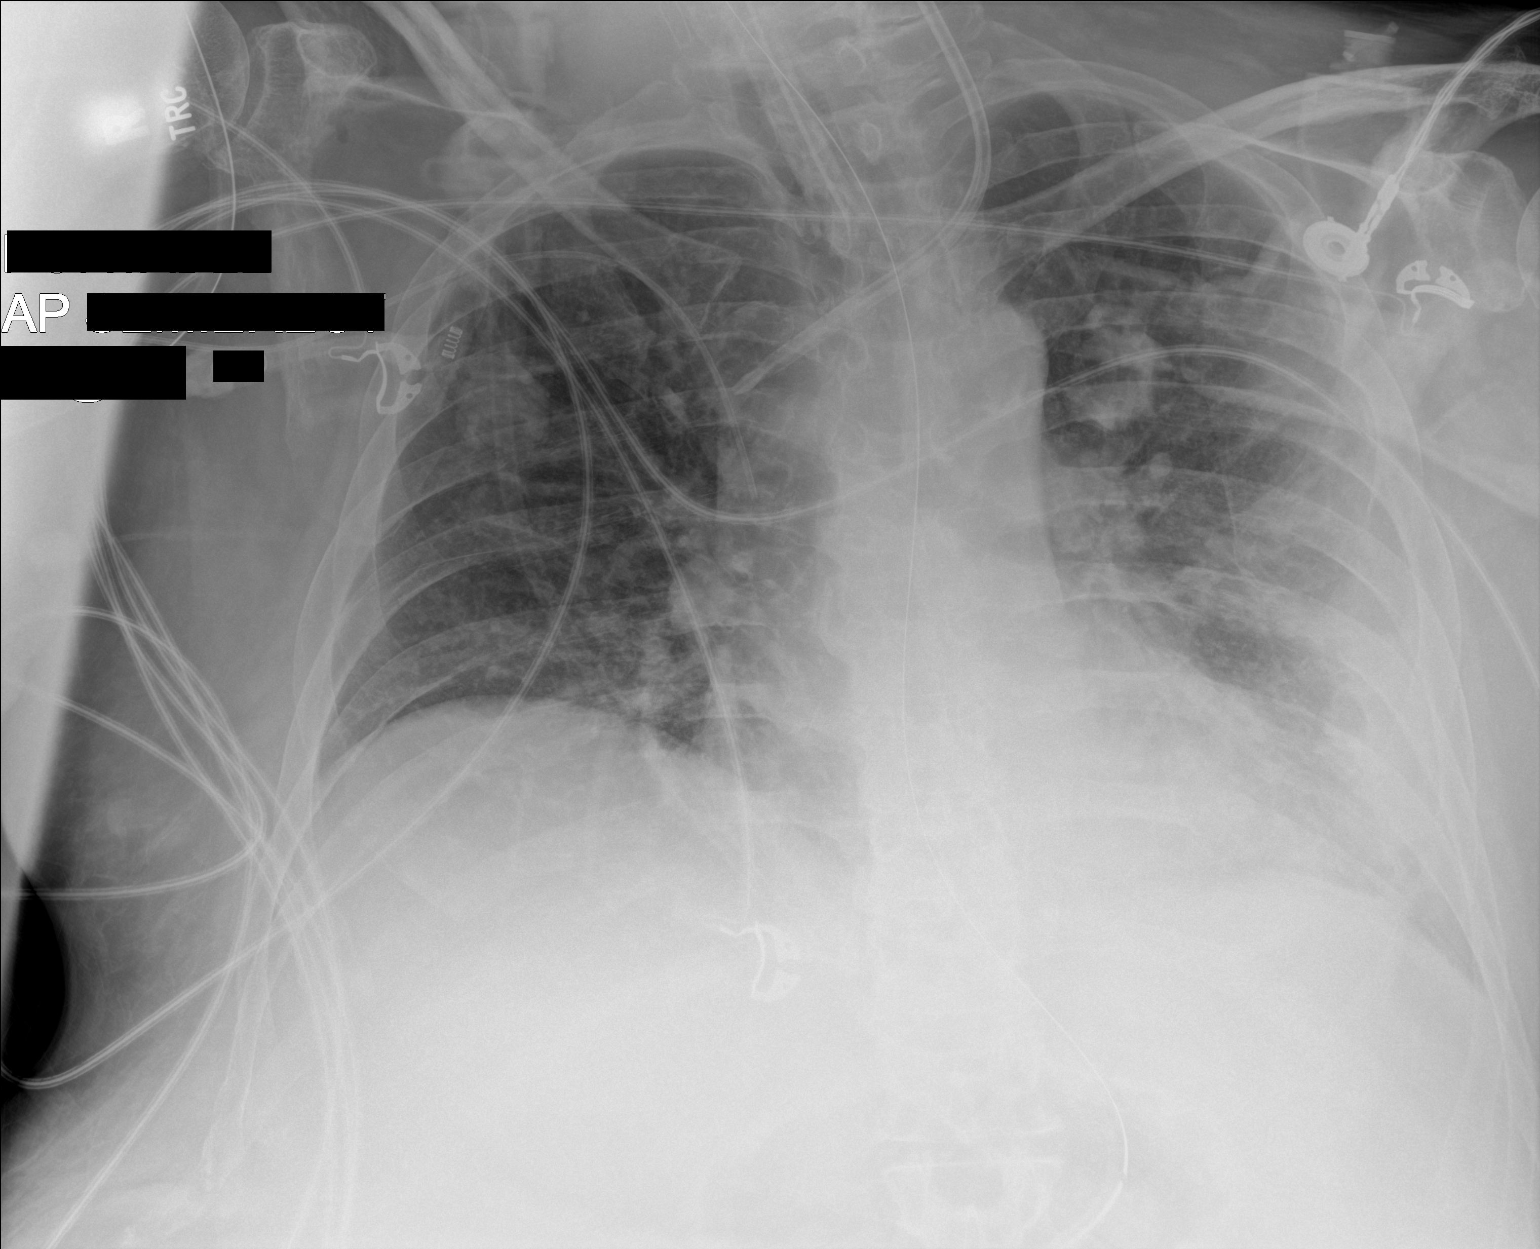

[2 of 2 positions shown; findings below may reference images not displayed]

FINDINGS: Tip of endotracheal tube projects 4.5 cm above carina.

Nasogastric tube extends into stomach.

RIGHT arm PICC line and LEFT jugular line tips project over SVC.

Normal heart size mediastinal contours.

BILATERAL pulmonary infiltrates LEFT greater than RIGHT, improved at
LEFT base since prior exam.

Questionable small loculated lateral LEFT pleural effusion.

No pneumothorax.
IMPRESSION: BILATERAL pulmonary infiltrates LEFT greater than RIGHT, improved on
LEFT since previous exam.

## 2020-06-18 IMAGING — US US ABDOMEN COMPLETE
1 series · 14 of 25 positions shown · non-contrast
Comparison: CT [DATE]

CLINICAL DATA: Transaminitis

EXAM:
ABDOMEN ULTRASOUND COMPLETE

[Series 1: us abdomen complete · 14 of 65 slices shown]
[im 1/65]
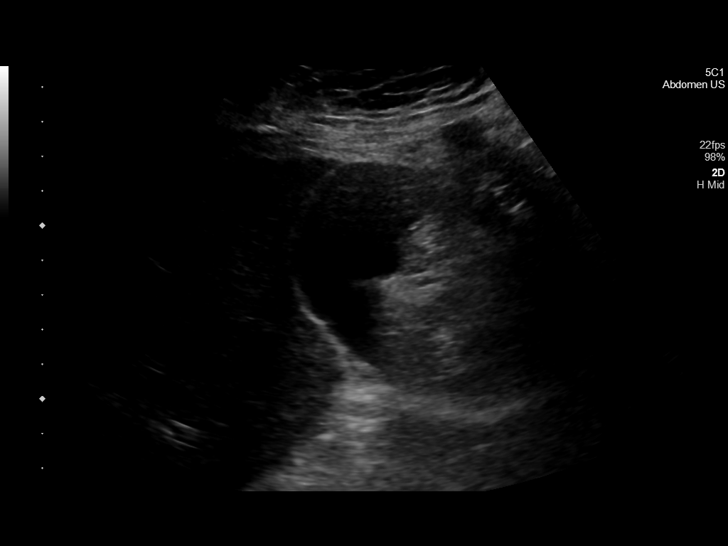
[im 6/65]
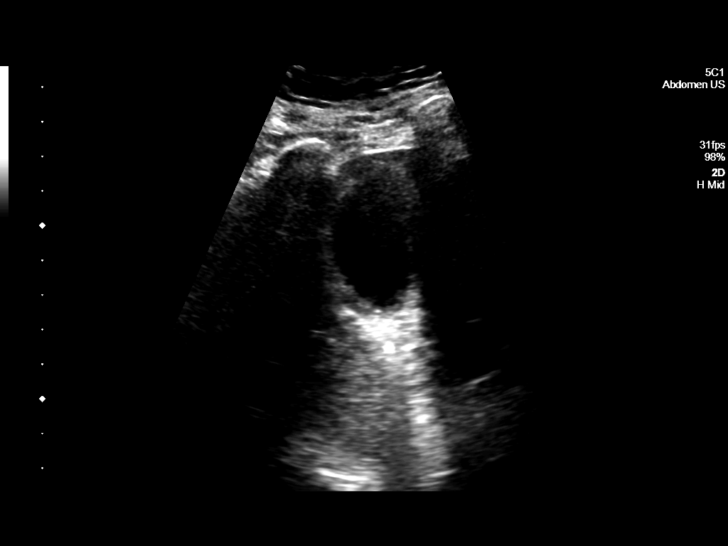
[im 11/65]
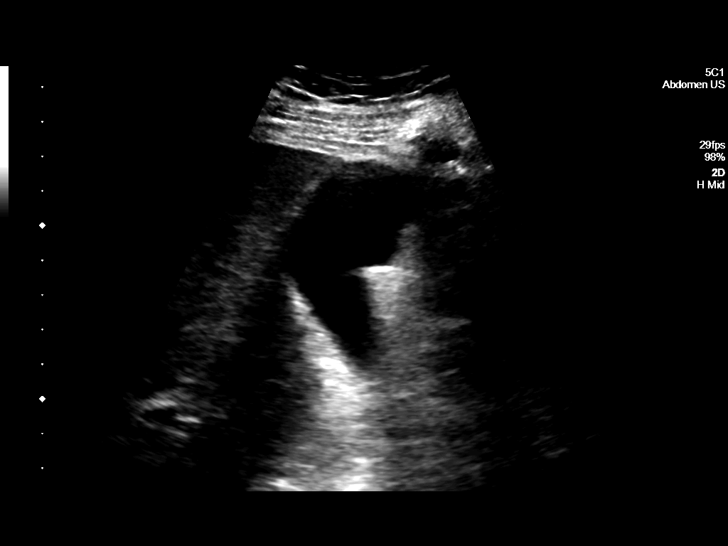
[im 17/65]
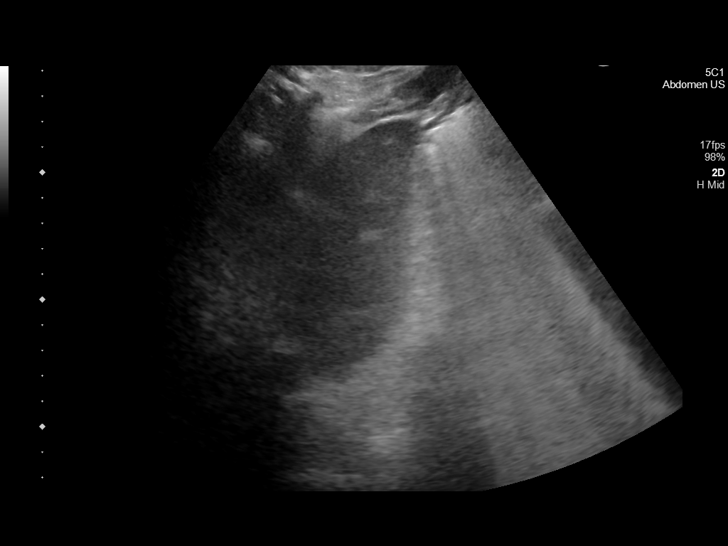
[im 22/65]
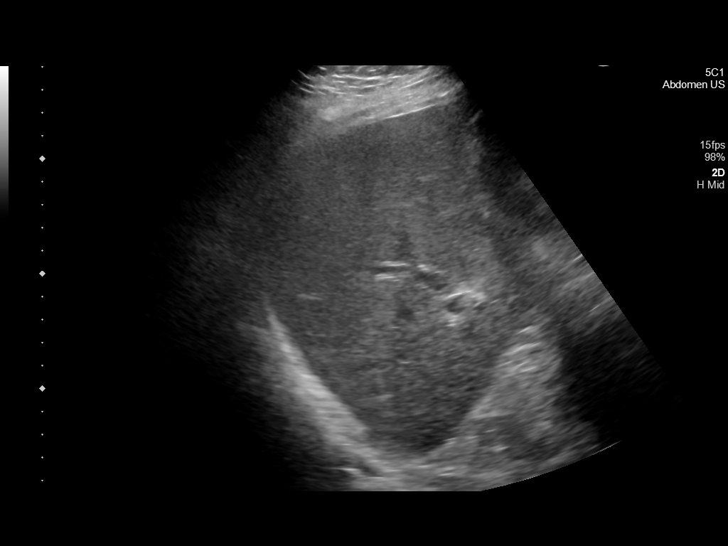
[im 25/65]
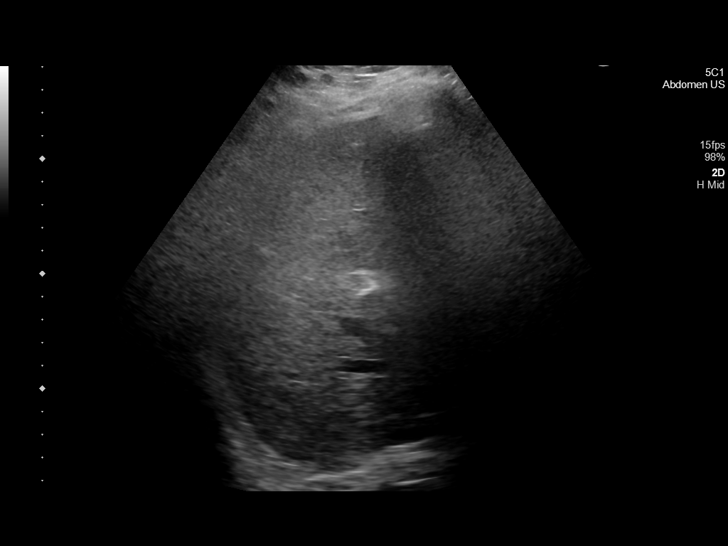
[im 30/65]
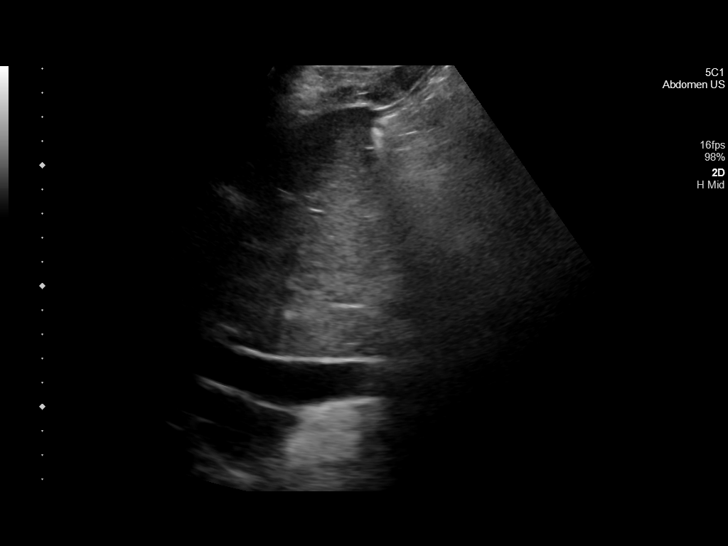
[im 35/65]
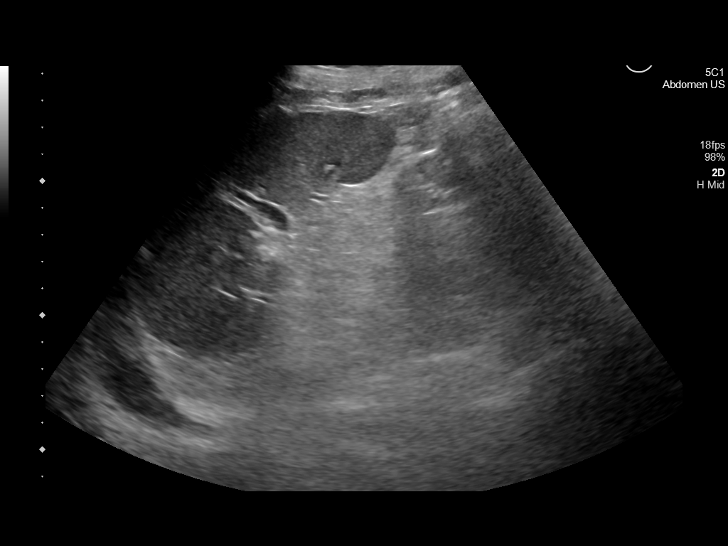
[im 41/65]
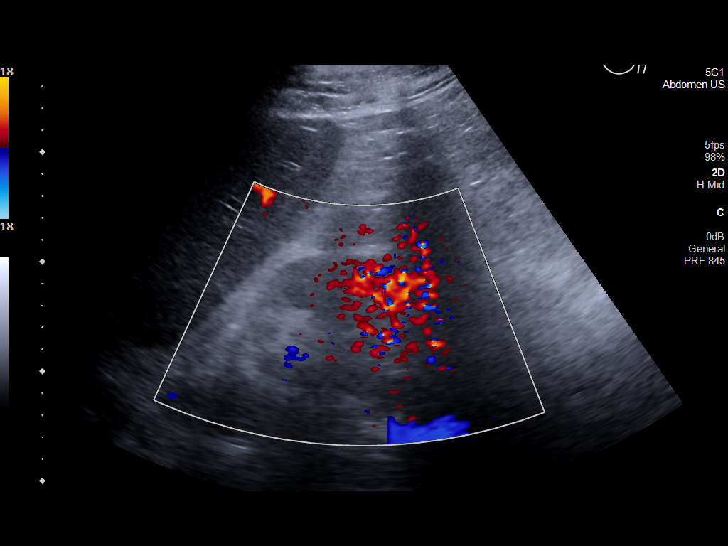
[im 43/65]
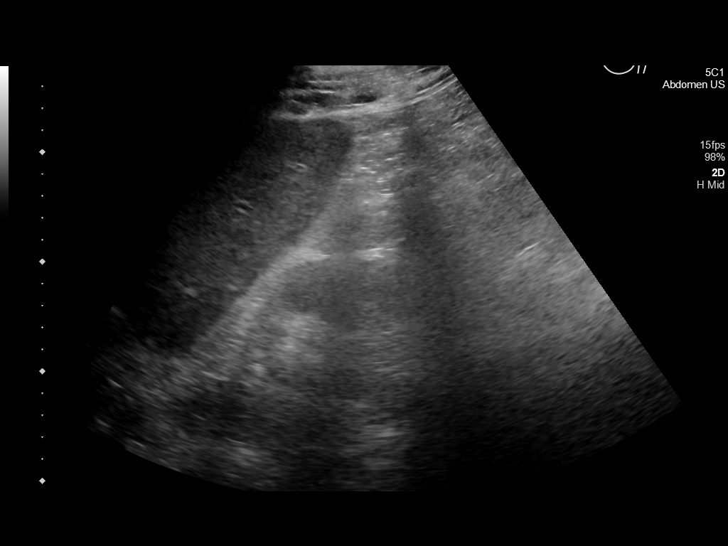
[im 49/65]
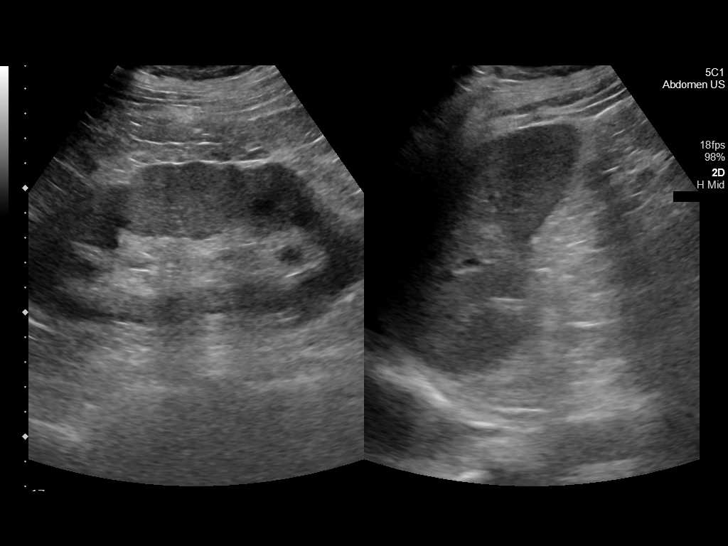
[im 54/65]
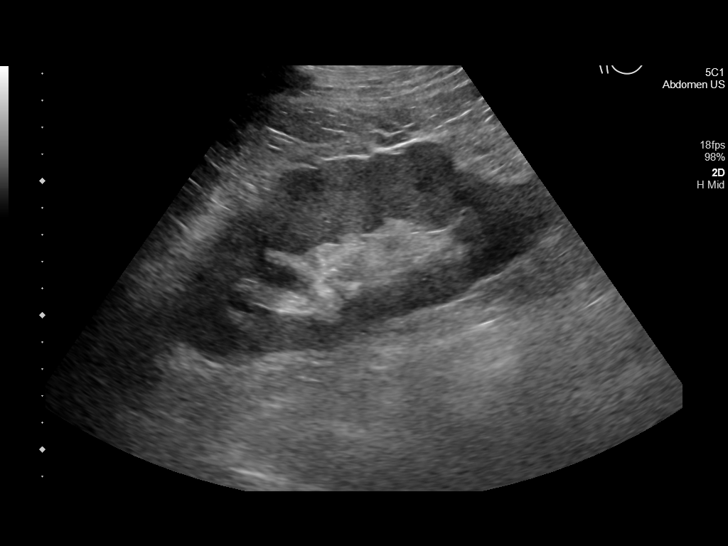
[im 59/65]
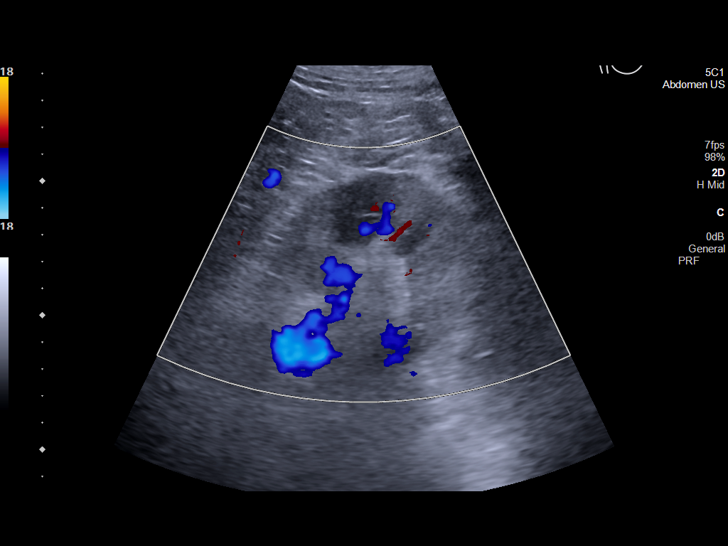
[im 65/65]
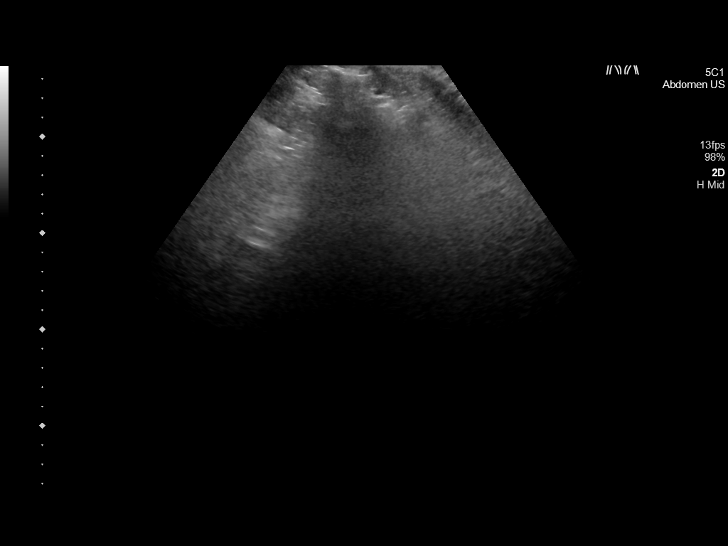

[14 of 25 positions shown; findings below may reference images not displayed]

FINDINGS: Gallbladder: No gallstones or wall thickening visualized. No
sonographic Murphy sign noted by sonographer.

Common bile duct: Diameter: 2 mm

Liver: Evaluation of the hepatic parenchyma is somewhat limited by
patient condition. No focal lesion identified. Within normal limits
in parenchymal echogenicity. Portal vein is patent on color Doppler
imaging with normal direction of blood flow towards the liver.

IVC: No abnormality visualized.

Pancreas: Not well visualized.

Spleen: Size and appearance within normal limits.

Right Kidney: Length: 10.7 cm. Increased echogenicity. No mass or
hydronephrosis visualized.

Left Kidney: Length: 14.4 cm. Increased echogenicity. No mass or
hydronephrosis visualized.

Abdominal aorta: No aneurysm visualized.

Other findings: None.
IMPRESSION: No acute sonographic abnormality identified, on this study which
limited given patient's condition.

## 2020-06-18 MED ORDER — PRAZOSIN HCL 2 MG PO CAPS
4.0000 mg | ORAL_CAPSULE | Freq: Every day | ORAL | Status: DC
  Administered 2020-06-18: 4 mg
  Filled 2020-06-18 (×2): qty 2

## 2020-06-18 MED ORDER — PRAZOSIN HCL 1 MG PO CAPS
1.0000 mg | ORAL_CAPSULE | Freq: Two times a day (BID) | ORAL | Status: DC | PRN
  Administered 2020-06-19: 1 mg
  Filled 2020-06-18 (×2): qty 1

## 2020-06-18 MED ORDER — DIAZEPAM 5 MG PO TABS: 5.0000 mg | ORAL_TABLET | Freq: Four times a day (QID) | ORAL | Status: DC | PRN

## 2020-06-18 MED ORDER — OXYCODONE HCL 5 MG PO TABS
10.0000 mg | ORAL_TABLET | Freq: Four times a day (QID) | ORAL | Status: DC | PRN
  Administered 2020-06-19: 10 mg
  Filled 2020-06-18: qty 2

## 2020-06-18 NOTE — Progress Notes (Signed)
NAME:  Wyatt Ball., MRN:  [id number], DOB:  [birth year], LOS: 16 ADMISSION DATE:  06/04/2020 60 y.o. male with medical history significant for HTN, PTSD, diabetes, migraines, GERD, neurogenic bladder secondary to remote history of transverse myelitis, who self catheterizes and has frequent UTIs as well as chronic back pain on chronic opiates who at baseline is independent - brought to the ER with a 3-day history of altered mental status.   + cough, shortness of breath and fever as well as vomiting and diarrhea and started becoming increasingly confused in the 24 hours prior to arrival.       Unable to get any meaningful history from patient.   ED course: On arrival, temperature 99.3, BP 167/67, pulse 99, respirations 18 with O2 sat 96% on room air.  Blood work significant for leukocytosis of 13,600 with lactic acid of 1.2.  Ammonia level normal at 15, EtOH less than 10.  Sodium 130, potassium 3.2, glucose 249 creatinine 1.06.  Urinalysis with 80 ketones and rare bacteria.  UDS positive for opiates and tricyclic's.  COVID and flu negative       Imaging:   CT chest abdomen and pelvis without contrast consistent with pneumonia.  Multiple nodules left upper lobe likely infectious or inflammatory with follow-up imaging recommended to document resolution.   Cardiomegaly with trace pericardial effusion   Patient was started on sepsis fluids and broad-spectrum antibiotics initially for sepsis of unknown source.       Significant Hospital Events: Including procedures, antibiotic start and stop dates in addition to other pertinent events   5/31 admitted for sepsis present on admission for Left lung pneumonia 6/1 progressive confusion and hypoxia 6/2 transferred to ICU, INTUBATED, S/P BRONCH 6/3 high fevers, remains ill, SELF EXTUBATED 6/3 emergently re-intubated 6/5 failure to wean from v ent 6/6 failure to wean from vent, VASC CATH PLACED 6/7 HD started remains critically ill 6/8  remains on vent 6/9 waiting for family for SAT 06/17/2020-patient had dialysis today without acute events.  He was unable to pass SBT even post HD due to Care plan reviewed with infectious disease today.  06/18/20- patient still unable to follow verbal communication.  Repeat SBT today with goal for extubation     CBC    Component Value Date/Time   WBC 8.9 06/18/2020 0408   RBC 3.05 (L) 06/18/2020 0408   HGB 9.0 (L) 06/18/2020 0408   HCT 26.1 (L) 06/18/2020 0408   PLT 391 06/18/2020 0408   MCV 85.6 06/18/2020 0408   MCH 29.5 06/18/2020 0408   MCHC 34.5 06/18/2020 0408   RDW 15.9 (H) 06/18/2020 0408   LYMPHSABS 1.0 06/18/2020 0408   MONOABS 0.6 06/18/2020 0408   EOSABS 0.0 06/18/2020 0408   BASOSABS 0.0 06/18/2020 0408     BMP Latest Ref Rng & Units 06/18/2020 06/17/2020 06/16/2020  Glucose 70 - 99 mg/dL 135(H) 172(H) 204(H)  BUN 8 - 23 mg/dL 152(H) 185(H) 161(H)  Creatinine 0.61 - 1.24 mg/dL 5.14(H) 6.06(H) 6.03(H)  Sodium 135 - 145 mmol/L 143 146(H) 146(H)  Potassium 3.5 - 5.1 mmol/L 4.9 5.4(H) 4.8  Chloride 98 - 111 mmol/L 108 110 111  CO2 22 - 32 mmol/L _0 Calcium 8.9 - 10.3 mg/dL 8.5(L) 8.5(L) 8.4(L)       Objective   Blood pressure (!) 191/70, pulse (!) 120, temperature 99.4 F (37.4 C), temperature source Axillary, resp. rate (!) 21, height 5' 5.98" (1.676 m), weight 97.9 kg, SpO2 97 %.  Vent Mode: PRVC FiO2 (%):  [28 %] 28 % Set Rate:  [24 bmp] 24 bmp Vt Set:  [520 mL] 520 mL PEEP:  [5 cmH20] 5 cmH20 Pressure Support:  [5 cmH20] 5 cmH20 Plateau Pressure:  [22 cmH20] 22 cmH20   Intake/Output Summary (Last 24 hours) at 06/18/2020 0953 Last data filed at 06/18/2020 0357 Gross per 24 hour  Intake 502.18 ml  Output 3200 ml  Net -2697.82 ml    Filed Weights   06/16/20 0500 06/16/20 0715 06/18/20 0421  Weight: 99.8 kg 97.3 kg 97.9 kg   Scheduled Meds:  amLODipine  5 mg Per Tube Daily   budesonide (PULMICORT) nebulizer solution  0.5 mg Nebulization  BID   chlorhexidine gluconate (MEDLINE KIT)  15 mL Mouth Rinse BID   Chlorhexidine Gluconate Cloth  6 each Topical Daily   cloNIDine  0.2 mg Transdermal Weekly   diazepam  5 mg Per Tube Q6H   feeding supplement (PROSource TF)  45 mL Per Tube Daily   free water  100 mL Per Tube Q4H   furosemide  40 mg Intravenous Daily   heparin injection (subcutaneous)  5,000 Units Subcutaneous Q8H   insulin aspart  0-20 Units Subcutaneous Q4H   insulin aspart  5 Units Subcutaneous Q4H   insulin glargine  20 Units Subcutaneous Daily   ipratropium-albuterol  3 mL Nebulization Q6H   labetalol  200 mg Per Tube BID   mouth rinse  15 mL Mouth Rinse 10 times per day   methylPREDNISolone (SOLU-MEDROL) injection  20 mg Intravenous Daily   multivitamin  1 tablet Per Tube QHS   oxyCODONE  10 mg Per Tube Q6H   pantoprazole (PROTONIX) IV  40 mg Intravenous Q24H   sodium bicarbonate  1,300 mg Per Tube BID   sodium chloride flush  10-40 mL Intracatheter Q12H   traZODone  150 mg Per Tube QHS   Continuous Infusions:  sodium chloride Stopped (06/18/20 0207)   dexmedetomidine (PRECEDEX) IV infusion Stopped (06/18/20 0207)   feeding supplement (VITAL HIGH PROTEIN) 1,000 mL (06/16/20 1556)   fentaNYL infusion INTRAVENOUS 100 mcg/hr (06/18/20 0357)   levofloxacin (LEVAQUIN) IV Stopped (06/16/20 1919)   PRN Meds:.sodium chloride, fentaNYL, heparin, hydrALAZINE, midazolam, ondansetron **OR** ondansetron (ZOFRAN) IV, sodium chloride flush    REVIEW OF SYSTEMS  PATIENT IS UNABLE TO PROVIDE COMPLETE REVIEW OF SYSTEMS DUE TO SEVERE CRITICAL ILLNESS AND TOXIC METABOLIC ENCEPHALOPATHY on MV   PHYSICAL EXAMINATION:  GENERAL:critically ill appearing,intubated, sedated HEAD: Normocephalic, atraumatic. EYES: Pupils equal, round, reactive to light.  No scleral icterus. MOUTH: ETT, OG in place NECK: Supple. Trachea midline dialysis catheter on left neck wrapped without exudation or oozing of blood with clean  dressing PULMONARY: Mechanical ventilator audible respiratory motion laterally CARDIOVASCULAR: S1 and S2. Regular rate and rhythm. No murmurs, rubs, or gallops. GASTROINTESTINAL: Soft, non-distended. Positive bowel sounds. MUSCULOSKELETAL: No swelling, clubbing, or edema. NEUROLOGIC: sedated SKIN:intact,warm,dry  ASSESSMENT AND PLAN  ACUTE Hypoxic and Hypercapnic Respiratory Failure -continue Mechanical Ventilator support -continue Bronchodilator Therapy -Wean Fio2 and PEEP as tolerated -Continue VAP/VENT bundle -will perform SAT/SBT when respiratory parameters are met -too sedated for SBT -weaning sedatives     SEVERE COPD EXACERBATION -continue IV steroids -continue NEB THERAPY     ACUTE KIDNEY INJURY/Renal Failure -continue Foley Catheter-assess need -Avoid nephrotoxic agents -Follow urine output, BMP -Ensure adequate renal perfusion, optimize oxygenation -Renal dose medications - HD today per Dr. Cherylann Ratel     Acute toxic metabolic encephalopathy Goal RASS -0 Precedex  SEPTIC SHOCK -resolved -use vasopressors to keep MAP>65 as needed -follow ABG and LA -follow up cultures -continueABX   INFECTIOUS DISEASE -continue antibiotics as prescribed -follow up cultures -follow up ID recs   MRSA NEG RVP NEGATIVE COVID NEG INF AB NEG SPUTUM CX NG AFB SMEAR NEG HIV NEG   ENDO - ICU hypoglycemic\Hyperglycemia protocol -check FSBS per protocol     GI GI PROPHYLAXIS as indicated   NUTRITIONAL STATUS DIET-->TF's as tolerated Constipation protocol as indicated     ELECTROLYTES -follow labs as needed -replace as needed -pharmacy consultation and following   MAR reviewed with clinical pharmacist     Best practice (right click and "Reselect all SmartList Selections" daily)  Diet:  NPO Pain/Anxiety/Delirium protocol (if indicated): Yes (RASS goal -2) VAP protocol (if indicated): Yes DVT prophylaxis: Subcutaneous Heparin GI prophylaxis: H2B Glucose  control:  SSI Yes Central venous access:  N/A Arterial line:  N/A Foley:  N/A Mobility:  bed rest  PT consulted: N/A Code Status:  full code Disposition: ICU   Labs   CBC: Recent Labs  Lab 06/14/20 0304 06/15/20 0349 06/16/20 0509 06/17/20 0537 06/18/20 0408  WBC 9.6 8.4 7.8 7.3 8.9  NEUTROABS 7.8* 6.4 6.1 5.7 7.2  HGB 8.6* 8.3* 8.6* 8.2* 9.0*  HCT 26.1* 25.4* 25.4* 24.7* 26.1*  MCV 87.3 88.2 86.4 87.3 85.6  PLT 313 337 387 369 391     Basic Metabolic Panel: Recent Labs  Lab 06/14/20 0304 06/15/20 0349 06/16/20 0509 06/17/20 0537 06/18/20 0408  NA 142 142 146* 146* 143  K 4.0 4.3 4.8 5.4* 4.9  CL 109 110 111 110 108  CO2 _0 GLUCOSE 222* 199* 204* 172* 135*  BUN 108* 120* 161* 185* 152*  CREATININE 4.67* 5.12* 6.03* 6.06* 5.14*  CALCIUM 7.7* 7.7* 8.4* 8.5* 8.5*  MG 2.2 2.4 2.7* 2.9* 2.7*  PHOS 5.8* 9.6* 11.5* 12.1* 11.1*    GFR: Estimated Creatinine Clearance: 16.5 mL/min (A) (by C-G formula based on SCr of 5.14 mg/dL (H)). Recent Labs  Lab 06/15/20 0349 06/16/20 0509 06/17/20 0537 06/18/20 0408  WBC 8.4 7.8 7.3 8.9     Liver Function Tests: Recent Labs  Lab 06/14/20 0304 06/16/20 0509  ALBUMIN 1.9* 1.9*    No results for input(s): LIPASE, AMYLASE in the last 168 hours. No results for input(s): AMMONIA in the last 168 hours.  ABG    Component Value Date/Time   PHART 7.33 (L) 06/08/2020 0500   PCO2ART 40 06/08/2020 0500   PO2ART 99 06/08/2020 0500   HCO3 21.1 06/08/2020 0500   ACIDBASEDEF 4.5 (H) 06/08/2020 0500   O2SAT 97.2 06/08/2020 0500      Coagulation Profile: No results for input(s): INR, PROTIME in the last 168 hours.  Cardiac Enzymes: No results for input(s): CKTOTAL, CKMB, CKMBINDEX, TROPONINI in the last 168 hours.   HbA1C: Hgb A1c MFr Bld  Date/Time Value Ref Range Status  06/04/2020 09:48 PM 7.4 (H) 4.8 - 5.6 % Final    Comment:    (NOTE)         Prediabetes: 5.7 - 6.4         Diabetes: >6.4          Glycemic control for adults with diabetes: <7.0     CBG: Recent Labs  Lab 06/17/20 1941 06/17/20 2007 06/17/20 2353 06/18/20 0403 06/18/20 0737  GLUCAP 209* 206* 175* 124* 114*     Allergies No Known Allergies  The patient is critically ill with multiple organ systems failure and requires high complexity decision making for assessment and support, frequent evaluation and titration of therapies, application of advanced monitoring technologies and extensive interpretation of multiple databases. Critical Care Time devoted to patient care services described in this note is 35 minutes.   Ottie Glazier, M.D.  [facility]    *This note was dictated using voice recognition software/Dragon.  Despite best efforts to proofread, errors can occur which can change the meaning.  Any change was purely unintentional.

## 2020-06-18 NOTE — TOC Progression Note (Addendum)
Transition of Care (TOC) - Progression Note    Patient Details  Name: Wyatt Ball. MRN: [id number] Date of Birth: 09-26-1960  Transition of Care [facility]) CM/SW Contact  Marina Goodell Phone Number: [phone] 06/18/2020, 11:11 AM  Clinical Narrative:     Patient's sedation turned off on 06/17/2020. Patient had HD without acute events on 6/13/ 2022 and passed spontaneous breathing trials.  Possible extubation today.  Expected Discharge Plan: Skilled Nursing Facility Barriers to Discharge: Continued Medical Work up  Expected Discharge Plan and Services Expected Discharge Plan: Skilled Nursing Facility In-house Referral: NA   Post Acute Care Choice: Skilled Nursing Facility Living arrangements for the past 2 months: Single Family Home                                       Social Determinants of Health (SDOH) Interventions    Readmission Risk Interventions No flowsheet data found.

## 2020-06-18 NOTE — Progress Notes (Signed)
OT Cancellation Note  Patient Details Name: Wyatt Ball. MRN: [id number] DOB: 1960-07-23   Cancelled Treatment:    Reason Eval/Treat Not Completed: Medical issues which prohibited therapy. Order received and chart reviewed. Pt noted with red MEWS of 5 at this time. Will hold OT evaluation and continue to monitor remotely. Will initiate OT services as available and pt medically appropriate.   Rockney Ghee, M.S., OTR/L Ascom: [phone] 06/18/20, 11:00 AM

## 2020-06-18 NOTE — Progress Notes (Signed)
[place], [place] 06/18/20  Subjective:   Hospital day # 14  Patient underwent hemodialysis treatment yesterday. Creatinine has come down a bit to 5.14. Good urine output at 2.4 L yesterday.   Renal: 06/13 0701 - 06/14 0700 In: 556 [I.V.:306.2; IV Piggyback:249.8] Out: 3400 [Urine:2400] Lab Results  Component Value Date   CREATININE 5.14 (H) 06/18/2020   CREATININE 6.06 (H) 06/17/2020   CREATININE 6.03 (H) 06/16/2020     Objective:  Vital signs in last 24 hours:  Temp:  [97.2 F (36.2 C)-100 F (37.8 C)] 100 F (37.8 C) (06/14 0400) Pulse Rate:  [73-106] 93 (06/14 0600) Resp:  [0-24] 24 (06/14 0600) BP: (120-173)/(53-90) 173/61 (06/14 0600) SpO2:  [92 %-99 %] 96 % (06/14 0600) FiO2 (%):  [28 %] 28 % (06/14 0259) Weight:  [97.9 kg] 97.9 kg (06/14 0421)  Weight change: 0.6 kg Filed Weights   06/16/20 0500 06/16/20 0715 06/18/20 0421  Weight: 99.8 kg 97.3 kg 97.9 kg    Intake/Output:    Intake/Output Summary (Last 24 hours) at 06/18/2020 0658 Last data filed at 06/18/2020 0357 Gross per 24 hour  Intake 735.01 ml  Output 3400 ml  Net -2664.99 ml     Physical Exam:  General: Critically ill-appearing  HEENT anicteric,  ETT, OG tube  Pulm/lungs coarse breath sounds, ventilator assisted  CVS/Heart regular rhythm, no rubs  Abdomen:  Soft, nontender  Extremities: 1+ peripheral edema  Neurologic: Sedated  Skin: Warm, dry  Coud catheter in place  Rectal tube in place   Basic Metabolic Panel:  Recent Labs  Lab 06/14/20 0304 06/15/20 0349 06/16/20 0509 06/17/20 0537 06/18/20 0408  NA 142 142 146* 146* 143  K 4.0 4.3 4.8 5.4* 4.9  CL 109 110 111 110 108  CO2 _0 GLUCOSE 222* 199* 204* 172* 135*  BUN 108* 120* 161* 185* 152*  CREATININE 4.67* 5.12* 6.03* 6.06* 5.14*  CALCIUM 7.7* 7.7* 8.4* 8.5* 8.5*  MG 2.2 2.4 2.7* 2.9* 2.7*  PHOS 5.8* 9.6* 11.5* 12.1* 11.1*      CBC: Recent Labs  Lab  06/14/20 0304 06/15/20 0349 06/16/20 0509 06/17/20 0537 06/18/20 0408  WBC 9.6 8.4 7.8 7.3 8.9  NEUTROABS 7.8* 6.4 6.1 5.7 7.2  HGB 8.6* 8.3* 8.6* 8.2* 9.0*  HCT 26.1* 25.4* 25.4* 24.7* 26.1*  MCV 87.3 88.2 86.4 87.3 85.6  PLT 313 337 387 369 391       Lab Results  Component Value Date   HEPBSAG NON REACTIVE 06/09/2020   HEPBSAB NON REACTIVE 06/09/2020      Microbiology:  No results found for this or any previous visit (from the past 240 hour(s)).   Coagulation Studies: No results for input(s): LABPROT, INR in the last 72 hours.  Urinalysis: No results for input(s): COLORURINE, LABSPEC, PHURINE, GLUCOSEU, HGBUR, BILIRUBINUR, KETONESUR, PROTEINUR, UROBILINOGEN, NITRITE, LEUKOCYTESUR in the last 72 hours.  Invalid input(s): APPERANCEUR    Imaging: No results found.    Medications:    sodium chloride Stopped (06/18/20 0207)   dexmedetomidine (PRECEDEX) IV infusion Stopped (06/18/20 0207)   feeding supplement (VITAL HIGH PROTEIN) 1,000 mL (06/16/20 1556)   fentaNYL infusion INTRAVENOUS 100 mcg/hr (06/18/20 0357)   levofloxacin (LEVAQUIN) IV Stopped (06/16/20 1919)    amLODipine  5 mg Per Tube Daily   budesonide (PULMICORT) nebulizer solution  0.5 mg Nebulization BID   chlorhexidine gluconate (MEDLINE KIT)  15 mL Mouth Rinse BID   Chlorhexidine Gluconate Cloth  6  each Topical Daily   cloNIDine  0.2 mg Transdermal Weekly   diazepam  5 mg Per Tube Q6H   feeding supplement (PROSource TF)  45 mL Per Tube Daily   free water  100 mL Per Tube Q4H   furosemide  40 mg Intravenous Daily   heparin injection (subcutaneous)  5,000 Units Subcutaneous Q8H   insulin aspart  0-20 Units Subcutaneous Q4H   insulin aspart  5 Units Subcutaneous Q4H   insulin glargine  20 Units Subcutaneous Daily   ipratropium-albuterol  3 mL Nebulization Q6H   labetalol  200 mg Per Tube BID   mouth rinse  15 mL Mouth Rinse 10 times per day   methylPREDNISolone (SOLU-MEDROL) injection  20 mg  Intravenous Daily   multivitamin  1 tablet Per Tube QHS   oxyCODONE  10 mg Per Tube Q6H   pantoprazole (PROTONIX) IV  40 mg Intravenous Q24H   sodium bicarbonate  1,300 mg Per Tube BID   sodium chloride flush  10-40 mL Intracatheter Q12H   traZODone  150 mg Per Tube QHS   sodium chloride, fentaNYL, heparin, hydrALAZINE, midazolam, ondansetron **OR** ondansetron (ZOFRAN) IV, sodium chloride flush  Assessment/ Plan:  60 y.o. male with  medical problems of   Hypertension, PTSD, diabetes, migraines, GERD, neurogenic bladder secondary to remote history of transverse myelitis, requires self-catheterization and has frequent UTIs, opioids for chronic pain  admitted on 06/04/2020 for Sepsis ([place]) [A41.9] Altered mental status, unspecified altered mental status type [R41.82] Community acquired pneumonia, unspecified laterality [J18.9]   #Acute kidney injury, proteinuria, hematuria Baseline creatinine of 1.26 at admission Increasing creatinine trends since June 3. AKI is likely multifactorial secondary to ongoing sepsis as well as IV contrast exposure on June 3 Urinalysis at admission showed glucosuria, greater than 300 proteinuria, 6-10 RBCs, moderate hemoglobin noted on dipstick, 11-20 WBCs Renal imaging as noted above.  Patient had CT abdomen pelvis without contrast.  No hydronephrosis.  Bladder was unremarkable. UPC 2.55 gm RBC 21-50 cc June 09, 2020: ANA negative, ESR normal, complement C3-C4 normal, kappa lambda ratio normal, hepatitis B and hepatitis C studies negative, HIV negative, crypto antigen negative Hemodialysis started on June 6, had treatment on 6/6, 6/9, 6/13.   Plan: Patient underwent hemodialysis treatment yesterday.  Tolerated well.  BUN down to 152 with a creatinine of 5.14.   #Acute respiratory failure Hopefully can be weaned from the ventilator today.   #Dependent peripheral edema Who performed ultrafiltration of 1 kg yesterday.  Continue to monitor edema  status.  #Hyperkalemia. Potassium down to 4.9 today.  Hemodialysis was performed yesterday and was effective and draining potassium down.    LOS: 14 Wyatt Ball 6/14/20226:58 AM  [place], [place]  Note: This note was prepared with Dragon dictation. Any transcription errors are unintentional

## 2020-06-18 NOTE — Progress Notes (Signed)
Per Cheryll Cockayne NP sedation turned off at 0200.

## 2020-06-19 LAB — BASIC METABOLIC PANEL
Anion gap: 15 (ref 5–15)
BUN: 174 mg/dL — ABNORMAL HIGH (ref 8–23)
CO2: 23 mmol/L (ref 22–32)
Calcium: 8.5 mg/dL — ABNORMAL LOW (ref 8.9–10.3)
Chloride: 110 mmol/L (ref 98–111)
Creatinine, Ser: 5.87 mg/dL — ABNORMAL HIGH (ref 0.61–1.24)
GFR, Estimated: 10 mL/min — ABNORMAL LOW (ref >60)
Glucose, Bld: 179 mg/dL — ABNORMAL HIGH (ref 70–99)
Potassium: 5.3 mmol/L — ABNORMAL HIGH (ref 3.5–5.1)
Sodium: 148 mmol/L — ABNORMAL HIGH (ref 135–145)

## 2020-06-19 LAB — CBC WITH DIFFERENTIAL/PLATELET
Abs Immature Granulocytes: 0.03 10*3/uL (ref 0.00–0.07)
Basophils Absolute: 0 10*3/uL (ref 0.0–0.1)
Basophils Relative: 0 %
Eosinophils Absolute: 0 10*3/uL (ref 0.0–0.5)
Eosinophils Relative: 0 %
HCT: 23.5 % — ABNORMAL LOW (ref 39.0–52.0)
Hemoglobin: 8.1 g/dL — ABNORMAL LOW (ref 13.0–17.0)
Immature Granulocytes: 0 %
Lymphocytes Relative: 10 %
Lymphs Abs: 0.7 10*3/uL (ref 0.7–4.0)
MCH: 29.9 pg (ref 26.0–34.0)
MCHC: 34.5 g/dL (ref 30.0–36.0)
MCV: 86.7 fL (ref 80.0–100.0)
Monocytes Absolute: 0.5 10*3/uL (ref 0.1–1.0)
Monocytes Relative: 7 %
Neutro Abs: 5.9 10*3/uL (ref 1.7–7.7)
Neutrophils Relative %: 83 %
Platelets: 309 10*3/uL (ref 150–400)
RBC: 2.71 MIL/uL — ABNORMAL LOW (ref 4.22–5.81)
RDW: 15.9 % — ABNORMAL HIGH (ref 11.5–15.5)
WBC: 7.2 10*3/uL (ref 4.0–10.5)
nRBC: 0 % (ref 0.0–0.2)

## 2020-06-19 LAB — GLUCOSE, CAPILLARY
Glucose-Capillary: 157 mg/dL — ABNORMAL HIGH (ref 70–99)
Glucose-Capillary: 148 mg/dL — ABNORMAL HIGH (ref 70–99)
Glucose-Capillary: 154 mg/dL — ABNORMAL HIGH (ref 70–99)
Glucose-Capillary: 105 mg/dL — ABNORMAL HIGH (ref 70–99)
Glucose-Capillary: 136 mg/dL — ABNORMAL HIGH (ref 70–99)
Glucose-Capillary: 91 mg/dL (ref 70–99)

## 2020-06-19 LAB — PHOSPHORUS: Phosphorus: 12.8 mg/dL — ABNORMAL HIGH (ref 2.5–4.6)

## 2020-06-19 LAB — MAGNESIUM: Magnesium: 2.7 mg/dL — ABNORMAL HIGH (ref 1.7–2.4)

## 2020-06-19 MED ORDER — TRAZODONE HCL 50 MG PO TABS: 150.0000 mg | ORAL_TABLET | Freq: Every day | ORAL | Status: DC

## 2020-06-19 MED ORDER — HEPARIN SODIUM (PORCINE) 1000 UNIT/ML IJ SOLN
2600.0000 [IU] | Freq: Once | INTRAMUSCULAR | Status: AC
  Administered 2020-06-19: 2600 [IU]

## 2020-06-19 MED ORDER — RENA-VITE PO TABS: 1.0000 | ORAL_TABLET | Freq: Every day | ORAL | Status: DC

## 2020-06-19 MED ORDER — OXYCODONE HCL 5 MG PO TABS: 10.0000 mg | ORAL_TABLET | Freq: Four times a day (QID) | ORAL | Status: DC | PRN

## 2020-06-19 MED ORDER — DIAZEPAM 5 MG PO TABS: 5.0000 mg | ORAL_TABLET | Freq: Four times a day (QID) | ORAL | Status: DC | PRN

## 2020-06-19 MED ORDER — PRAZOSIN HCL 2 MG PO CAPS
4.0000 mg | ORAL_CAPSULE | Freq: Every day | ORAL | Status: DC
  Filled 2020-06-19 (×3): qty 2

## 2020-06-19 MED ORDER — AMLODIPINE BESYLATE 5 MG PO TABS: 5.0000 mg | ORAL_TABLET | Freq: Every day | ORAL | Status: DC

## 2020-06-19 MED ORDER — SODIUM BICARBONATE 650 MG PO TABS
1300.0000 mg | ORAL_TABLET | Freq: Two times a day (BID) | ORAL | Status: DC
  Filled 2020-06-19 (×6): qty 2

## 2020-06-19 MED ORDER — PRAZOSIN HCL 1 MG PO CAPS
1.0000 mg | ORAL_CAPSULE | Freq: Two times a day (BID) | ORAL | Status: DC | PRN
  Filled 2020-06-19: qty 1

## 2020-06-19 MED ORDER — LABETALOL HCL 200 MG PO TABS
200.0000 mg | ORAL_TABLET | Freq: Two times a day (BID) | ORAL | Status: DC
  Filled 2020-06-19 (×3): qty 1

## 2020-06-19 NOTE — Progress Notes (Signed)
PT Cancellation Note  Patient Details Name: Wyatt Ball. MRN: [id number] DOB: [birth year]   Cancelled Treatment:    Reason Eval/Treat Not Completed: Other (comment) Pt extubated earlier this AM, arrived in pt room this afternoon.  Dialysis was setting up for in-room HD, will continue to follow and attempt to see tomorrow.  Malachi Pro, DPT 06/19/2020, 3:34 PM

## 2020-06-19 NOTE — Progress Notes (Addendum)
0715 All sedation and tube feeding stopped to prepare for extubation.0900 More alert. Dr.Aleskerov in to see patient. 1100 Extubated to nasal cannula. Still slow to respond.

## 2020-06-19 NOTE — Progress Notes (Signed)
Patient extubated to 3L Nasal Cannula per MD request with no complications. Sats 95% on [facility]

## 2020-06-19 NOTE — Progress Notes (Signed)
[place], [place] 06/19/20  Subjective:   Hospital day # 15  Patient now extubated. Still appears to be encephalopathic. Creatinine currently 5.87. We are planning for another dialysis session.   Renal: 06/14 0701 - 06/15 0700 In: 3865 [I.V.:593.2; NG/GT:3200; IV Piggyback:71.8] Out: 2400 [Urine:2400] Lab Results  Component Value Date   CREATININE 5.87 (H) 06/19/2020   CREATININE 5.14 (H) 06/18/2020   CREATININE 6.06 (H) 06/17/2020     Objective:  Vital signs in last 24 hours:  Temp:  [99.2 F (37.3 C)-99.9 F (37.7 C)] 99.9 F (37.7 C) (06/15 0800) Pulse Rate:  [81-116] 90 (06/15 1000) Resp:  [12-46] 19 (06/15 1000) BP: (110-181)/(55-88) 160/69 (06/15 1000) SpO2:  [93 %-98 %] 96 % (06/15 1000) FiO2 (%):  [30 %] 30 % (06/15 0908) Weight:  [98.1 kg] 98.1 kg (06/15 0500)  Weight change: 0.2 kg Filed Weights   06/16/20 0715 06/18/20 0421 06/19/20 0500  Weight: 97.3 kg 97.9 kg 98.1 kg    Intake/Output:    Intake/Output Summary (Last 24 hours) at 06/19/2020 1133 Last data filed at 06/19/2020 1000 Gross per 24 hour  Intake 4164.99 ml  Output 4200 ml  Net -35.01 ml     Physical Exam:  General: No acute distress  HEENT anicteric, oral mucosa moist  Pulm/lungs coarse breath sounds, normal effort  CVS/Heart regular rhythm, no rubs  Abdomen:  Soft, nontender  Extremities: 1+ peripheral edema  Neurologic: Awake, alert, confused  Skin: Warm, dry  Coud catheter in place  Rectal tube in place   Basic Metabolic Panel:  Recent Labs  Lab 06/15/20 0349 06/16/20 0509 06/17/20 0537 06/18/20 0408 06/19/20 0448  NA 142 146* 146* 143 148*  K 4.3 4.8 5.4* 4.9 5.3*  CL 110 111 110 108 110  CO2 _0 GLUCOSE 199* 204* 172* 135* 179*  BUN 120* 161* 185* 152* 174*  CREATININE 5.12* 6.03* 6.06* 5.14* 5.87*  CALCIUM 7.7* 8.4* 8.5* 8.5* 8.5*  MG 2.4 2.7* 2.9* 2.7* 2.7*  PHOS 9.6* 11.5* 12.1* 11.1* 12.8*       CBC: Recent Labs  Lab 06/15/20 0349 06/16/20 0509 06/17/20 0537 06/18/20 0408 06/19/20 0448  WBC 8.4 7.8 7.3 8.9 7.2  NEUTROABS 6.4 6.1 5.7 7.2 5.9  HGB 8.3* 8.6* 8.2* 9.0* 8.1*  HCT 25.4* 25.4* 24.7* 26.1* 23.5*  MCV 88.2 86.4 87.3 85.6 86.7  PLT 337 387 369 391 309       Lab Results  Component Value Date   HEPBSAG NON REACTIVE 06/09/2020   HEPBSAB NON REACTIVE 06/09/2020      Microbiology:  Recent Results (from the past 240 hour(s))  Culture, Respiratory w Gram Stain     Status: None (Preliminary result)   Collection Time: 06/18/20  4:43 PM   Specimen: Tracheal Aspirate; Respiratory  Result Value Ref Range Status   Specimen Description   Final    TRACHEAL ASPIRATE Performed at [facility], [address] [place], [place] [address]    Special Requests   Final    NONE Performed at [facility], [address] [place], [place] [address]    Gram Stain   Final    MODERATE WBC PRESENT,BOTH PMN AND MONONUCLEAR FEW YEAST RARE GRAM NEGATIVE RODS Performed at [facility], [place] [address] [place], [place] [address]    Culture PENDING  Incomplete   Report Status PENDING  Incomplete     Coagulation Studies: No results for input(s): LABPROT,  INR in the last 72 hours.  Urinalysis: No results for input(s): COLORURINE, LABSPEC, PHURINE, GLUCOSEU, HGBUR, BILIRUBINUR, KETONESUR, PROTEINUR, UROBILINOGEN, NITRITE, LEUKOCYTESUR in the last 72 hours.  Invalid input(s): APPERANCEUR    Imaging: DG Abd 1 View  Result Date: 06/18/2020 CLINICAL DATA:  OG placement EXAM: ABDOMEN - 1 VIEW COMPARISON:  06/07/2020 FINDINGS: Esophageal tube tip and side port overlie the mid to distal stomach. Mild increased small and large bowel gas without significant distention. Bilateral hip replacements. No radiopaque calculi. IMPRESSION: 1. Esophageal tube tip overlies the distal stomach 2. Nonobstructed bowel-gas pattern Electronically Signed   By: Donavan Foil M.D.   On: 06/18/2020 17:54   US Abdomen Complete  Result Date: 06/18/2020 CLINICAL DATA:  Transaminitis EXAM: ABDOMEN ULTRASOUND COMPLETE COMPARISON:  CT Jun 04, 2020 FINDINGS: Gallbladder: No gallstones or wall thickening visualized. No sonographic Murphy sign noted by sonographer. Common bile duct: Diameter: 2 mm Liver: Evaluation of the hepatic parenchyma is somewhat limited by patient condition. No focal lesion identified. Within normal limits in parenchymal echogenicity. Portal vein is patent on color Doppler imaging with normal direction of blood flow towards the liver. IVC: No abnormality visualized. Pancreas: Not well visualized. Spleen: Size and appearance within normal limits. Right Kidney: Length: 10.7 cm. Increased echogenicity. No mass or hydronephrosis visualized. Left Kidney: Length: 14.4 cm. Increased echogenicity. No mass or hydronephrosis visualized. Abdominal aorta: No aneurysm visualized. Other findings: None. IMPRESSION: No acute sonographic abnormality identified, on this study which limited given patient's condition. Electronically Signed   By: Dahlia Bailiff MD   On: 06/18/2020 09:45   DG Chest Port 1 View  Result Date: 06/18/2020 CLINICAL DATA:  Acute respiratory failure EXAM: PORTABLE CHEST 1 VIEW COMPARISON:  Portable exam 0424 hours compared to 06/15/2020 FINDINGS: Tip of endotracheal tube projects 4.5 cm above carina. Nasogastric tube extends into stomach. RIGHT arm PICC line and LEFT jugular line tips project over SVC. Normal heart size mediastinal contours. BILATERAL pulmonary infiltrates LEFT greater than RIGHT, improved at LEFT base since prior exam. Questionable small loculated lateral LEFT pleural effusion. No pneumothorax. IMPRESSION: BILATERAL pulmonary infiltrates LEFT greater than RIGHT, improved on LEFT since previous exam. Electronically Signed   By: Lavonia Dana M.D.   On: 06/18/2020 07:41      Medications:    sodium chloride 10 mL/hr at 06/19/20 0533    dexmedetomidine (PRECEDEX) IV infusion 0.6 mcg/kg/hr (06/19/20 0533)   feeding supplement (VITAL HIGH PROTEIN) 1,000 mL (06/18/20 1727)   fentaNYL infusion INTRAVENOUS 100 mcg/hr (06/19/20 0533)    amLODipine  5 mg Per Tube Daily   budesonide (PULMICORT) nebulizer solution  0.5 mg Nebulization BID   chlorhexidine gluconate (MEDLINE KIT)  15 mL Mouth Rinse BID   Chlorhexidine Gluconate Cloth  6 each Topical Daily   cloNIDine  0.2 mg Transdermal Weekly   feeding supplement (PROSource TF)  45 mL Per Tube Daily   free water  100 mL Per Tube Q4H   furosemide  40 mg Intravenous Daily   heparin injection (subcutaneous)  5,000 Units Subcutaneous Q8H   insulin aspart  0-20 Units Subcutaneous Q4H   insulin aspart  5 Units Subcutaneous Q4H   insulin glargine  20 Units Subcutaneous Daily   ipratropium-albuterol  3 mL Nebulization Q6H   labetalol  200 mg Per Tube BID   mouth rinse  15 mL Mouth Rinse 10 times per day   methylPREDNISolone (SOLU-MEDROL) injection  20 mg Intravenous Daily   multivitamin  1 tablet Per Tube QHS  pantoprazole (PROTONIX) IV  40 mg Intravenous Q24H   prazosin  4 mg Per Tube QHS   sodium bicarbonate  1,300 mg Per Tube BID   sodium chloride flush  10-40 mL Intracatheter Q12H   traZODone  150 mg Per Tube QHS   sodium chloride, diazepam, fentaNYL, heparin, hydrALAZINE, midazolam, ondansetron **OR** ondansetron (ZOFRAN) IV, oxyCODONE, prazosin, sodium chloride flush  Assessment/ Plan:  60 y.o. male with  medical problems of   Hypertension, PTSD, diabetes, migraines, GERD, neurogenic bladder secondary to remote history of transverse myelitis, requires self-catheterization and has frequent UTIs, opioids for chronic pain  admitted on 06/04/2020 for Sepsis ([place]) [A41.9] Altered mental status, unspecified altered mental status type [R41.82] Community acquired pneumonia, unspecified laterality [J18.9]   #Acute kidney injury, proteinuria, hematuria Baseline creatinine of 1.26  at admission Increasing creatinine trends since June 3. AKI is likely multifactorial secondary to ongoing sepsis as well as IV contrast exposure on June 3 Urinalysis at admission showed glucosuria, greater than 300 proteinuria, 6-10 RBCs, moderate hemoglobin noted on dipstick, 11-20 WBCs Renal imaging as noted above.  Patient had CT abdomen pelvis without contrast.  No hydronephrosis.  Bladder was unremarkable. UPC 2.55 gm RBC 21-50 cc June 09, 2020: ANA negative, ESR normal, complement C3-C4 normal, kappa lambda ratio normal, hepatitis B and hepatitis C studies negative, HIV negative, crypto antigen negative Hemodialysis started on June 6, had treatment on 6/6, 6/9, 6/13.   Plan: Continues to have significant azotemia.  As such we will plan for another dialysis session today.   #Acute respiratory failure Now extubated.  Breathing comfortably.   #Dependent peripheral edema We will plan for ultrafiltration of 1 kg today.  #Hyperkalemia. Potassium slightly high at 5.3.  Should come down with dialysis treatment.    LOS: 15 Azriella Mattia 6/15/202211:33 [place], [place]  Note: This note was prepared with Dragon dictation. Any transcription errors are unintentional

## 2020-06-19 NOTE — Progress Notes (Signed)
OT Cancellation Note  Patient Details Name: Wyatt Ball. MRN: [id number] DOB: 07/19/1960   Cancelled Treatment:    Reason Eval/Treat Not Completed: Medical issues which prohibited therapy. OT continues to follow remotely. Pt noted with most recent K+ values of 5.3 (outside parameters recommended for exertional activity) and remains emergently intubated. Will hold OT evaluation at this time and initiate services as available and pt medically appropriate. Will continue to follow.  Rockney Ghee, M.S., OTR/L Ascom: [phone] 06/19/20, 8:35 AM

## 2020-06-19 NOTE — Progress Notes (Signed)
Extubated at 1100. Tolerated extubation all day but only spoke after dialysis was ending. Glassy eyed most of day. VSS. B/P in decent range. No issues today.

## 2020-06-19 NOTE — Progress Notes (Signed)
NAME:  Wyatt Dupras., MRN:  [id number], DOB:  [birth year], LOS: 71 ADMISSION DATE:  06/04/2020 60 y.o. male with medical history significant for HTN, PTSD, diabetes, migraines, GERD, neurogenic bladder secondary to remote history of transverse myelitis, who self catheterizes and has frequent UTIs as well as chronic back pain on chronic opiates who at baseline is independent - brought to the ER with a 3-day history of altered mental status.   + cough, shortness of breath and fever as well as vomiting and diarrhea and started becoming increasingly confused in the 24 hours prior to arrival.       Unable to get any meaningful history from patient.   ED course: On arrival, temperature 99.3, BP 167/67, pulse 99, respirations 18 with O2 sat 96% on room air.  Blood work significant for leukocytosis of 13,600 with lactic acid of 1.2.  Ammonia level normal at 15, EtOH less than 10.  Sodium 130, potassium 3.2, glucose 249 creatinine 1.06.  Urinalysis with 80 ketones and rare bacteria.  UDS positive for opiates and tricyclic's.  COVID and flu negative       Imaging:   CT chest abdomen and pelvis without contrast consistent with pneumonia.  Multiple nodules left upper lobe likely infectious or inflammatory with follow-up imaging recommended to document resolution.   Cardiomegaly with trace pericardial effusion   Patient was started on sepsis fluids and broad-spectrum antibiotics initially for sepsis of unknown source.       Significant Hospital Events: Including procedures, antibiotic start and stop dates in addition to other pertinent events   5/31 admitted for sepsis present on admission for Left lung pneumonia 6/1 progressive confusion and hypoxia 6/2 transferred to ICU, INTUBATED, S/P BRONCH 6/3 high fevers, remains ill, SELF EXTUBATED 6/3 emergently re-intubated 6/5 failure to wean from v ent 6/6 failure to wean from vent, VASC CATH PLACED 6/7 HD started remains critically ill 6/8  remains on vent 6/9 waiting for family for SAT 06/17/2020-patient had dialysis today without acute events.  He was unable to pass SBT even post HD due to Care plan reviewed with infectious disease today.  06/18/20- patient still unable to follow verbal communication.  Repeat SBT today with goal for extubation   06/19/20-  patient is improved, will attempt to liberate from MV today post dialysis and SBT.      CBC    Component Value Date/Time   WBC 7.2 06/19/2020 0448   RBC 2.71 (L) 06/19/2020 0448   HGB 8.1 (L) 06/19/2020 0448   HCT 23.5 (L) 06/19/2020 0448   PLT 309 06/19/2020 0448   MCV 86.7 06/19/2020 0448   MCH 29.9 06/19/2020 0448   MCHC 34.5 06/19/2020 0448   RDW 15.9 (H) 06/19/2020 0448   LYMPHSABS 0.7 06/19/2020 0448   MONOABS 0.5 06/19/2020 0448   EOSABS 0.0 06/19/2020 0448   BASOSABS 0.0 06/19/2020 0448     BMP Latest Ref Rng & Units 06/19/2020 06/18/2020 06/17/2020  Glucose 70 - 99 mg/dL 179(H) 135(H) 172(H)  BUN 8 - 23 mg/dL 174(H) 152(H) 185(H)  Creatinine 0.61 - 1.24 mg/dL 5.87(H) 5.14(H) 6.06(H)  Sodium 135 - 145 mmol/L 148(H) 143 146(H)  Potassium 3.5 - 5.1 mmol/L 5.3(H) 4.9 5.4(H)  Chloride 98 - 111 mmol/L 110 108 110  CO2 22 - 32 mmol/L 23 23 22   Calcium 8.9 - 10.3 mg/dL 8.5(L) 8.5(L) 8.5(L)       Objective   Blood pressure 125/60, pulse 83, temperature 99.3 F (37.4 C), resp. rate 12,  height 5' 5.98" (1.676 m), weight 98.1 kg, SpO2 97 %.    Vent Mode: PRVC FiO2 (%):  [30 %] 30 % Set Rate:  [24 bmp] 24 bmp Vt Set:  [520 mL-2520 mL] 2520 mL PEEP:  [5 cmH20] 5 cmH20 Pressure Support:  [8 cmH20] 8 cmH20 Plateau Pressure:  [16 cmH20] 16 cmH20   Intake/Output Summary (Last 24 hours) at 06/19/2020 0832 Last data filed at 06/19/2020 0533 Gross per 24 hour  Intake 3864.99 ml  Output 2400 ml  Net 1464.99 ml    Filed Weights   06/16/20 0715 06/18/20 0421 06/19/20 0500  Weight: 97.3 kg 97.9 kg 98.1 kg   Scheduled Meds:  amLODipine  5 mg Per Tube Daily    budesonide (PULMICORT) nebulizer solution  0.5 mg Nebulization BID   chlorhexidine gluconate (MEDLINE KIT)  15 mL Mouth Rinse BID   Chlorhexidine Gluconate Cloth  6 each Topical Daily   cloNIDine  0.2 mg Transdermal Weekly   feeding supplement (PROSource TF)  45 mL Per Tube Daily   free water  100 mL Per Tube Q4H   furosemide  40 mg Intravenous Daily   heparin injection (subcutaneous)  5,000 Units Subcutaneous Q8H   insulin aspart  0-20 Units Subcutaneous Q4H   insulin aspart  5 Units Subcutaneous Q4H   insulin glargine  20 Units Subcutaneous Daily   ipratropium-albuterol  3 mL Nebulization Q6H   labetalol  200 mg Per Tube BID   mouth rinse  15 mL Mouth Rinse 10 times per day   methylPREDNISolone (SOLU-MEDROL) injection  20 mg Intravenous Daily   multivitamin  1 tablet Per Tube QHS   pantoprazole (PROTONIX) IV  40 mg Intravenous Q24H   prazosin  4 mg Per Tube QHS   sodium bicarbonate  1,300 mg Per Tube BID   sodium chloride flush  10-40 mL Intracatheter Q12H   traZODone  150 mg Per Tube QHS   Continuous Infusions:  sodium chloride 10 mL/hr at 06/19/20 0533   dexmedetomidine (PRECEDEX) IV infusion 0.6 mcg/kg/hr (06/19/20 0533)   feeding supplement (VITAL HIGH PROTEIN) 1,000 mL (06/18/20 1727)   fentaNYL infusion INTRAVENOUS 100 mcg/hr (06/19/20 0533)   PRN Meds:.sodium chloride, diazepam, fentaNYL, heparin, hydrALAZINE, midazolam, ondansetron **OR** ondansetron (ZOFRAN) IV, oxyCODONE, prazosin, sodium chloride flush    REVIEW OF SYSTEMS  PATIENT IS UNABLE TO PROVIDE COMPLETE REVIEW OF SYSTEMS DUE TO SEVERE CRITICAL ILLNESS AND TOXIC METABOLIC ENCEPHALOPATHY on MV   PHYSICAL EXAMINATION:  GENERAL:critically ill appearing,intubated, sedated HEAD: Normocephalic, atraumatic. EYES: Pupils equal, round, reactive to light.  No scleral icterus. MOUTH: ETT, OG in place NECK: Supple. Trachea midline dialysis catheter on left neck wrapped without exudation or oozing of blood with  clean dressing PULMONARY: Mechanical ventilator audible respiratory motion laterally CARDIOVASCULAR: S1 and S2. Regular rate and rhythm. No murmurs, rubs, or gallops. GASTROINTESTINAL: Soft, non-distended. Positive bowel sounds. MUSCULOSKELETAL: No swelling, clubbing, or edema. NEUROLOGIC: sedated SKIN:intact,warm,dry  ASSESSMENT AND PLAN  ACUTE Hypoxic and Hypercapnic Respiratory Failure -continue Mechanical Ventilator support -continue Bronchodilator Therapy -Wean Fio2 and PEEP as tolerated -Continue VAP/VENT bundle -will perform SAT/SBT when respiratory parameters are met -too sedated for SBT -weaning sedatives  -plan to extubate today    SEVERE COPD EXACERBATION -continue IV steroids -continue NEB THERAPY     ACUTE KIDNEY INJURY/Renal Failure -continue Foley Catheter-assess need -Avoid nephrotoxic agents -Follow urine output, BMP -Ensure adequate renal perfusion, optimize oxygenation -Renal dose medications - HD today per Dr. Holley Raring     Acute toxic  metabolic encephalopathy Goal RASS -0 Precedex     SEPTIC SHOCK -resolved -use vasopressors to keep MAP>65 as needed -follow ABG and LA -follow up cultures -continueABX   INFECTIOUS DISEASE -continue antibiotics as prescribed -follow up cultures -follow up ID recs   MRSA NEG RVP NEGATIVE COVID NEG INF AB NEG SPUTUM CX NG AFB SMEAR NEG HIV NEG   ENDO - ICU hypoglycemic\Hyperglycemia protocol -check FSBS per protocol     GI GI PROPHYLAXIS as indicated   NUTRITIONAL STATUS DIET-->TF's as tolerated Constipation protocol as indicated     ELECTROLYTES -follow labs as needed -replace as needed -pharmacy consultation and following   MAR reviewed with clinical pharmacist     Best practice (right click and "Reselect all SmartList Selections" daily)  Diet:  NPO Pain/Anxiety/Delirium protocol (if indicated): Yes (RASS goal -2) VAP protocol (if indicated): Yes DVT prophylaxis: Subcutaneous  Heparin GI prophylaxis: H2B Glucose control:  SSI Yes Central venous access:  N/A Arterial line:  N/A Foley:  N/A Mobility:  bed rest  PT consulted: N/A Code Status:  full code Disposition: ICU   Labs   CBC: Recent Labs  Lab 06/15/20 0349 06/16/20 0509 06/17/20 0537 06/18/20 0408 06/19/20 0448  WBC 8.4 7.8 7.3 8.9 7.2  NEUTROABS 6.4 6.1 5.7 7.2 5.9  HGB 8.3* 8.6* 8.2* 9.0* 8.1*  HCT 25.4* 25.4* 24.7* 26.1* 23.5*  MCV 88.2 86.4 87.3 85.6 86.7  PLT 337 387 369 391 309     Basic Metabolic Panel: Recent Labs  Lab 06/15/20 0349 06/16/20 0509 06/17/20 0537 06/18/20 0408 06/19/20 0448  NA 142 146* 146* 143 148*  K 4.3 4.8 5.4* 4.9 5.3*  CL 110 111 110 108 110  CO2 22 22 22 23 23   GLUCOSE 199* 204* 172* 135* 179*  BUN 120* 161* 185* 152* 174*  CREATININE 5.12* 6.03* 6.06* 5.14* 5.87*  CALCIUM 7.7* 8.4* 8.5* 8.5* 8.5*  MG 2.4 2.7* 2.9* 2.7* 2.7*  PHOS 9.6* 11.5* 12.1* 11.1* 12.8*    GFR: Estimated Creatinine Clearance: 14.5 mL/min (A) (by C-G formula based on SCr of 5.87 mg/dL (H)). Recent Labs  Lab 06/16/20 0509 06/17/20 0537 06/18/20 0408 06/19/20 0448  WBC 7.8 7.3 8.9 7.2     Liver Function Tests: Recent Labs  Lab 06/14/20 0304 06/16/20 0509  ALBUMIN 1.9* 1.9*    No results for input(s): LIPASE, AMYLASE in the last 168 hours. No results for input(s): AMMONIA in the last 168 hours.  ABG    Component Value Date/Time   PHART 7.33 (L) 06/08/2020 0500   PCO2ART 40 06/08/2020 0500   PO2ART 99 06/08/2020 0500   HCO3 21.1 06/08/2020 0500   ACIDBASEDEF 4.5 (H) 06/08/2020 0500   O2SAT 97.2 06/08/2020 0500      Coagulation Profile: No results for input(s): INR, PROTIME in the last 168 hours.  Cardiac Enzymes: No results for input(s): CKTOTAL, CKMB, CKMBINDEX, TROPONINI in the last 168 hours.   HbA1C: Hgb A1c MFr Bld  Date/Time Value Ref Range Status  06/04/2020 09:48 PM 7.4 (H) 4.8 - 5.6 % Final    Comment:    (NOTE)          Prediabetes: 5.7 - 6.4         Diabetes: >6.4         Glycemic control for adults with diabetes: <7.0     CBG: Recent Labs  Lab 06/18/20 1616 06/18/20 1943 06/18/20 2332 06/19/20 0357 06/19/20 0712  GLUCAP 239* 201* 146* 157* 148*  Allergies No Known Allergies     The patient is critically ill with multiple organ systems failure and requires high complexity decision making for assessment and support, frequent evaluation and titration of therapies, application of advanced monitoring technologies and extensive interpretation of multiple databases. Critical Care Time devoted to patient care services described in this note is 35 minutes.   Ottie Glazier, M.D.  [facility] [place]    *This note was dictated using voice recognition software/Dragon.  Despite best efforts to proofread, errors can occur which can change the meaning.  Any change was purely unintentional.

## 2020-06-20 LAB — CBC WITH DIFFERENTIAL/PLATELET
Abs Immature Granulocytes: 0.04 10*3/uL (ref 0.00–0.07)
Basophils Absolute: 0.1 10*3/uL (ref 0.0–0.1)
Basophils Relative: 1 %
Eosinophils Absolute: 0 10*3/uL (ref 0.0–0.5)
Eosinophils Relative: 0 %
HCT: 27.2 % — ABNORMAL LOW (ref 39.0–52.0)
Hemoglobin: 8.9 g/dL — ABNORMAL LOW (ref 13.0–17.0)
Immature Granulocytes: 0 %
Lymphocytes Relative: 8 %
Lymphs Abs: 0.9 10*3/uL (ref 0.7–4.0)
MCH: 29.2 pg (ref 26.0–34.0)
MCHC: 32.7 g/dL (ref 30.0–36.0)
MCV: 89.2 fL (ref 80.0–100.0)
Monocytes Absolute: 0.8 10*3/uL (ref 0.1–1.0)
Monocytes Relative: 7 %
Neutro Abs: 9 10*3/uL — ABNORMAL HIGH (ref 1.7–7.7)
Neutrophils Relative %: 84 %
Platelets: 323 10*3/uL (ref 150–400)
RBC: 3.05 MIL/uL — ABNORMAL LOW (ref 4.22–5.81)
RDW: 15.8 % — ABNORMAL HIGH (ref 11.5–15.5)
WBC: 10.7 10*3/uL — ABNORMAL HIGH (ref 4.0–10.5)
nRBC: 0 % (ref 0.0–0.2)

## 2020-06-20 LAB — BASIC METABOLIC PANEL
Anion gap: 16 — ABNORMAL HIGH (ref 5–15)
BUN: 112 mg/dL — ABNORMAL HIGH (ref 8–23)
CO2: 22 mmol/L (ref 22–32)
Calcium: 8.5 mg/dL — ABNORMAL LOW (ref 8.9–10.3)
Chloride: 106 mmol/L (ref 98–111)
Creatinine, Ser: 4.43 mg/dL — ABNORMAL HIGH (ref 0.61–1.24)
GFR, Estimated: 14 mL/min — ABNORMAL LOW (ref >60)
Glucose, Bld: 119 mg/dL — ABNORMAL HIGH (ref 70–99)
Potassium: 4.4 mmol/L (ref 3.5–5.1)
Sodium: 144 mmol/L (ref 135–145)

## 2020-06-20 LAB — GLUCOSE, CAPILLARY
Glucose-Capillary: 108 mg/dL — ABNORMAL HIGH (ref 70–99)
Glucose-Capillary: 119 mg/dL — ABNORMAL HIGH (ref 70–99)
Glucose-Capillary: 138 mg/dL — ABNORMAL HIGH (ref 70–99)
Glucose-Capillary: 162 mg/dL — ABNORMAL HIGH (ref 70–99)
Glucose-Capillary: 111 mg/dL — ABNORMAL HIGH (ref 70–99)

## 2020-06-20 LAB — AMYLASE: Amylase: 61 U/L (ref 28–100)

## 2020-06-20 LAB — LIPASE, BLOOD: Lipase: 40 U/L (ref 11–51)

## 2020-06-20 LAB — PHOSPHORUS: Phosphorus: 10.6 mg/dL — ABNORMAL HIGH (ref 2.5–4.6)

## 2020-06-20 LAB — MAGNESIUM: Magnesium: 2.5 mg/dL — ABNORMAL HIGH (ref 1.7–2.4)

## 2020-06-20 NOTE — Progress Notes (Signed)
Nutrition Follow-up  DOCUMENTATION CODES:   Obesity unspecified  INTERVENTION:   RD will add supplements once diet advanced  Recommend Nepro Shake po TID, each supplement provides 425 kcal and 19 grams protein  Recommend Ocuvite daily for wound healing (provides zinc, vitamin A, vitamin C, Vitamin E, copper, and selenium)  Rena-vit daily   If nasogastric tube placed, recommend:  Nepro 1.8 @55ml/hr + ProSource TF 45ml daily via tube + Free water flushes 30ml q 4 hours  Regimen provides 2416kcal/day, 118g/day protein and 1140ml/day free water   Juven Fruit Punch BID via tube, each serving provides 95kcal and 2.5g of protein (amino acids glutamine and arginine)  NUTRITION DIAGNOSIS:   Inadequate oral intake related to inability to eat as evidenced by NPO status.  GOAL:   Patient will meet greater than or equal to 90% of their needs -previously met with tube feeds   MONITOR:   Labs, Weight trends, Diet advancement, Skin, I & O's  ASSESSMENT:   60 y.o. male with h/o hypertension, PTSD, diabetes, migraines, GERD, neurogenic bladder secondary to remote history of transverse myelitis, requires self-catheterization and has frequent UTIs and opioids for chronic pain who is admitted on 06/04/2020 for CAP and sepsis   Pt extubated 6/15; pt tolerating well. Pt with AMS today. Pt NPO. SLP evaluation pending. Of note, pt with poor dentition. Per RN, pt did not handle ice chips well this morning. If patient is unable to be placed on a diet, recommend nasogastric tube placement and nutrition support. Pt with sacral wound. Pt continues on HD. Per chart, pt is up ~16lbs since admit. Pt +3.5L on his I & O's.   Medications reviewed and include: lasix, heparin, insulin, rena-vit, protonix, Na bicarbonate, precedex  Labs reviewed: Na 144 wnl, K 4.4 wnl, BUN 112(H), creat 4.43(H), P 10.6(H), Mg 2.5(H) Wbc- 10.7(H), Hgb 8.9(L), Hct 27.2(L) cbgs- 108, 119, 138 x 24 hrs  UOP- 4050ml   Diet  Order:    Diet Order             Diet NPO time specified  Diet effective now                  EDUCATION NEEDS:   No education needs have been identified at this time  Skin:  Skin Assessment: Reviewed RN Assessment  Last BM:  6/16- 400ml  Height:   Ht Readings from Last 1 Encounters:  06/18/20 5' 5.98" (1.676 m)    Weight:   Wt Readings from Last 1 Encounters:  06/20/20 98.1 kg    Ideal Body Weight:  64.5 kg  BMI:  Body mass index is 34.92 kg/m.  Estimated Nutritional Needs:   Kcal:  2100-2400kcal/day  Protein:  105-120g/day  Fluid:  1.9-2.2L/day    MS, RD, LDN Please refer to [facility] for RD and/or RD on-call/weekend/after hours pager  

## 2020-06-20 NOTE — Progress Notes (Signed)
NAME:  Wyatt Corp., MRN:  [id number], DOB:  05-22-1960, LOS: 2 ADMISSION DATE:  06/04/2020 61 y.o. male with medical history significant for HTN, PTSD, diabetes, migraines, GERD, neurogenic bladder secondary to remote history of transverse myelitis, who self catheterizes and has frequent UTIs as well as chronic back pain on chronic opiates who at baseline is independent - brought to the ER with a 3-day history of altered mental status.   + cough, shortness of breath and fever as well as vomiting and diarrhea and started becoming increasingly confused in the 24 hours prior to arrival.       Unable to get any meaningful history from patient.   ED course: On arrival, temperature 99.3, BP 167/67, pulse 99, respirations 18 with O2 sat 96% on room air.  Blood work significant for leukocytosis of 13,600 with lactic acid of 1.2.  Ammonia level normal at 15, EtOH less than 10.  Sodium 130, potassium 3.2, glucose 249 creatinine 1.06.  Urinalysis with 80 ketones and rare bacteria.  UDS positive for opiates and tricyclic's.  COVID and flu negative       Imaging:   CT chest abdomen and pelvis without contrast consistent with pneumonia.  Multiple nodules left upper lobe likely infectious or inflammatory with follow-up imaging recommended to document resolution.   Cardiomegaly with trace pericardial effusion   Patient was started on sepsis fluids and broad-spectrum antibiotics initially for sepsis of unknown source.       Significant Hospital Events: Including procedures, antibiotic start and stop dates in addition to other pertinent events   5/31 admitted for sepsis present on admission for Left lung pneumonia 6/1 progressive confusion and hypoxia 6/2 transferred to ICU, INTUBATED, S/P BRONCH 6/3 high fevers, remains ill, SELF EXTUBATED 6/3 emergently re-intubated 6/5 failure to wean from v ent 6/6 failure to wean from vent, VASC CATH PLACED 6/7 HD started remains critically ill 6/8  remains on vent 6/9 waiting for family for SAT 06/17/2020-patient had dialysis today without acute events.  He was unable to pass SBT even post HD due to Care plan reviewed with infectious disease today.  06/18/20- patient still unable to follow verbal communication.  Repeat SBT today with goal for extubation   06/19/20-  patient is improved, will attempt to liberate from MV today post dialysis and SBT.  06/20/20- patient is improved, he is difficult to engage in conversation, I was able to capture his attention with negotiating food items and he did speak with me some. Unclear if there is psychiatric disease.     CBC    Component Value Date/Time   WBC 10.7 (H) 06/20/2020 0417   RBC 3.05 (L) 06/20/2020 0417   HGB 8.9 (L) 06/20/2020 0417   HCT 27.2 (L) 06/20/2020 0417   PLT 323 06/20/2020 0417   MCV 89.2 06/20/2020 0417   MCH 29.2 06/20/2020 0417   MCHC 32.7 06/20/2020 0417   RDW 15.8 (H) 06/20/2020 0417   LYMPHSABS 0.9 06/20/2020 0417   MONOABS 0.8 06/20/2020 0417   EOSABS 0.0 06/20/2020 0417   BASOSABS 0.1 06/20/2020 0417     BMP Latest Ref Rng & Units 06/20/2020 06/19/2020 06/18/2020  Glucose 70 - 99 mg/dL 119(H) 179(H) 135(H)  BUN 8 - 23 mg/dL 112(H) 174(H) 152(H)  Creatinine 0.61 - 1.24 mg/dL 4.43(H) 5.87(H) 5.14(H)  Sodium 135 - 145 mmol/L 144 148(H) 143  Potassium 3.5 - 5.1 mmol/L 4.4 5.3(H) 4.9  Chloride 98 - 111 mmol/L 106 110 108  CO2 22 -  32 mmol/L _0 Calcium 8.9 - 10.3 mg/dL 8.5(L) 8.5(L) 8.5(L)       Objective   Blood pressure (!) 160/69, pulse (!) 107, temperature 98.7 F (37.1 C), resp. rate 13, height 5' 5.98" (1.676 m), weight 98.1 kg, SpO2 94 %.        Intake/Output Summary (Last 24 hours) at 06/20/2020 1251 Last data filed at 06/20/2020 3005 Gross per 24 hour  Intake 88.96 ml  Output 3400 ml  Net -3311.04 ml    Filed Weights   06/18/20 0421 06/19/20 0500 06/20/20 0500  Weight: 97.9 kg 98.1 kg 98.1 kg   Scheduled Meds:  amLODipine  5 mg  Oral Daily   chlorhexidine gluconate (MEDLINE KIT)  15 mL Mouth Rinse BID   Chlorhexidine Gluconate Cloth  6 each Topical Daily   cloNIDine  0.2 mg Transdermal Weekly   furosemide  40 mg Intravenous Daily   heparin injection (subcutaneous)  5,000 Units Subcutaneous Q8H   insulin aspart  0-20 Units Subcutaneous Q4H   insulin glargine  20 Units Subcutaneous Daily   mouth rinse  15 mL Mouth Rinse 10 times per day   multivitamin  1 tablet Oral QHS   pantoprazole (PROTONIX) IV  40 mg Intravenous Q24H   prazosin  4 mg Oral QHS   sodium bicarbonate  1,300 mg Oral BID   sodium chloride flush  10-40 mL Intracatheter Q12H   traZODone  150 mg Oral QHS   Continuous Infusions:  sodium chloride Stopped (06/19/20 0717)   PRN Meds:.sodium chloride, hydrALAZINE, ondansetron **OR** ondansetron (ZOFRAN) IV, oxyCODONE, prazosin, sodium chloride flush    REVIEW OF SYSTEMS  PATIENT IS UNABLE TO PROVIDE COMPLETE REVIEW OF SYSTEMS DUE TO SEVERE CRITICAL ILLNESS AND TOXIC METABOLIC ENCEPHALOPATHY on MV   PHYSICAL EXAMINATION:  GENERAL: no apparent distress, older then stated age.  HEAD: Normocephalic, atraumatic. EYES: Pupils equal, round, reactive to light.  No scleral icterus. MOUTH: ETT, OG in place NECK: Supple. Trachea midline dialysis catheter on left neck wrapped without exudation or oozing of blood with clean dressing PULMONARY: rhonchi at left lung CARDIOVASCULAR: S1 and S2. Regular rate and rhythm. No murmurs, rubs, or gallops. GASTROINTESTINAL: Soft, non-distended. Positive bowel sounds. MUSCULOSKELETAL: No swelling, clubbing, or edema. NEUROLOGIC: internally preoccupied  SKIN:intact,warm,dry  ASSESSMENT AND PLAN  ACUTE Hypoxic and Hypercapnic Respiratory Failure -continue Mechanical Ventilator support -continue Bronchodilator Therapy -Wean Fio2 and PEEP as tolerated -Continue VAP/VENT bundle -will perform SAT/SBT when respiratory parameters are met -too sedated for SBT -weaning  sedatives  -plan to extubate today    SEVERE COPD EXACERBATION -continue IV steroids -continue NEB THERAPY     ACUTE KIDNEY INJURY/Renal Failure -continue Foley Catheter-assess need -Avoid nephrotoxic agents -Follow urine output, BMP -Ensure adequate renal perfusion, optimize oxygenation -Renal dose medications - HD today per Dr. Holley Raring     Acute toxic metabolic encephalopathy Goal RASS -0 Precedex     SEPTIC SHOCK -resolved -use vasopressors to keep MAP>65 as needed -follow ABG and LA -follow up cultures -continueABX   INFECTIOUS DISEASE -continue antibiotics as prescribed -follow up cultures -follow up ID recs   MRSA NEG RVP NEGATIVE COVID NEG INF AB NEG SPUTUM CX NG AFB SMEAR NEG HIV NEG   ENDO - ICU hypoglycemic\Hyperglycemia protocol -check FSBS per protocol     GI GI PROPHYLAXIS as indicated   NUTRITIONAL STATUS DIET-->TF's as tolerated Constipation protocol as indicated     ELECTROLYTES -follow labs as needed -replace as needed -pharmacy consultation and following  MAR reviewed with clinical pharmacist     Best practice (right click and "Reselect all SmartList Selections" daily)  Diet:  NPO Pain/Anxiety/Delirium protocol (if indicated): Yes (RASS goal -2) VAP protocol (if indicated): Yes DVT prophylaxis: Subcutaneous Heparin GI prophylaxis: H2B Glucose control:  SSI Yes Central venous access:  N/A Arterial line:  N/A Foley:  N/A Mobility:  bed rest  PT consulted: N/A Code Status:  full code Disposition: ICU   Labs   CBC: Recent Labs  Lab 06/16/20 0509 06/17/20 0537 06/18/20 0408 06/19/20 0448 06/20/20 0417  WBC 7.8 7.3 8.9 7.2 10.7*  NEUTROABS 6.1 5.7 7.2 5.9 9.0*  HGB 8.6* 8.2* 9.0* 8.1* 8.9*  HCT 25.4* 24.7* 26.1* 23.5* 27.2*  MCV 86.4 87.3 85.6 86.7 89.2  PLT 387 369 391 309 323     Basic Metabolic Panel: Recent Labs  Lab 06/16/20 0509 06/17/20 0537 06/18/20 0408 06/19/20 0448 06/20/20 0417  NA 146*  146* 143 148* 144  K 4.8 5.4* 4.9 5.3* 4.4  CL 111 110 108 110 106  CO2 _0 GLUCOSE 204* 172* 135* 179* 119*  BUN 161* 185* 152* 174* 112*  CREATININE 6.03* 6.06* 5.14* 5.87* 4.43*  CALCIUM 8.4* 8.5* 8.5* 8.5* 8.5*  MG 2.7* 2.9* 2.7* 2.7* 2.5*  PHOS 11.5* 12.1* 11.1* 12.8* 10.6*    GFR: Estimated Creatinine Clearance: 19.2 mL/min (A) (by C-G formula based on SCr of 4.43 mg/dL (H)). Recent Labs  Lab 06/17/20 0537 06/18/20 0408 06/19/20 0448 06/20/20 0417  WBC 7.3 8.9 7.2 10.7*     Liver Function Tests: Recent Labs  Lab 06/14/20 0304 06/16/20 0509  ALBUMIN 1.9* 1.9*    No results for input(s): LIPASE, AMYLASE in the last 168 hours. No results for input(s): AMMONIA in the last 168 hours.  ABG    Component Value Date/Time   PHART 7.33 (L) 06/08/2020 0500   PCO2ART 40 06/08/2020 0500   PO2ART 99 06/08/2020 0500   HCO3 21.1 06/08/2020 0500   ACIDBASEDEF 4.5 (H) 06/08/2020 0500   O2SAT 97.2 06/08/2020 0500      Coagulation Profile: No results for input(s): INR, PROTIME in the last 168 hours.  Cardiac Enzymes: No results for input(s): CKTOTAL, CKMB, CKMBINDEX, TROPONINI in the last 168 hours.   HbA1C: Hgb A1c MFr Bld  Date/Time Value Ref Range Status  06/04/2020 09:48 PM 7.4 (H) 4.8 - 5.6 % Final    Comment:    (NOTE)         Prediabetes: 5.7 - 6.4         Diabetes: >6.4         Glycemic control for adults with diabetes: <7.0     CBG: Recent Labs  Lab 06/19/20 2011 06/19/20 2329 06/20/20 0316 06/20/20 0711 06/20/20 1118  GLUCAP 136* 91 108* 119* 138*     Allergies No Known Allergies     The patient is critically ill with multiple organ systems failure and requires high complexity decision making for assessment and support, frequent evaluation and titration of therapies, application of advanced monitoring technologies and extensive interpretation of multiple databases. Critical Care Time devoted to patient care services described  in this note is 35 minutes.   Ottie Glazier, M.D.  P[facility] & [place]    *This note was dictated using voice recognition software/Dragon.  Despite best efforts to proofread, errors can occur which can change the meaning.  Any change was purely unintentional.

## 2020-06-20 NOTE — Evaluation (Signed)
Physical Therapy ReEvaluation Patient Details Name: Wyatt Ball. MRN: [id number] DOB: 30-Sep-1960 Today's Date: 06/20/2020   History of Present Illness  Wyatt Ball. is a 60 y.o. 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  Since hospitalization pt has been intubated (>10 days), extubated 6/15.  Clinical Impression  Spoke with nurse before session who reports he is stable for PT but acknowledges that he is still not aware enough and that participation will likely be limited.  This, indeed, was very true and though he was able to open his eyes to his name he was unable to give any verbal response and even with repeated tactile cues/PROM and heavy encouragement he was unable to show any real effort during upper or LE PROM exercises.  If there was fleeting trace effort from time to time it was very inconsistent and limited.  Will pick up for a PT trial to further assess appropriateness of working with PT going forward, again essentially no participation at this time.     Follow Up Recommendations SNF    Equipment Recommendations   (TBD)    Recommendations for Other Services       Precautions / Restrictions Precautions Precautions: Fall Restrictions Weight Bearing Restrictions: No      Mobility  Bed Mobility               General bed mobility comments: Unable to perform due to weakness in LE's.    Transfers                    Ambulation/Gait                Stairs            Wheelchair Mobility    Modified Rankin (Stroke Patients Only)       Balance Overall balance assessment:   (unable/unsafe to attempt sitting)                                           Pertinent Vitals/Pain      Home Living Family/patient expects to be discharged to:: Unsure                 Additional Comments: Pt unable to communicate effectively to for history taking.    Prior Function           Comments: Pt could not formulate words, showed inability to engage with any conversation, unknown PLOF     Hand Dominance        Extremity/Trunk Assessment   Upper Extremity Assessment Upper Extremity Assessment: Generalized weakness (essentially fully flaccid, may have felt a trace grip with heavy cuing)    Lower Extremity Assessment Lower Extremity Assessment: Generalized weakness (essentially fully flaccid, may have felt a trace leg extension with heavy/repeated cuing)       Communication   Communication:  (opens eyes to name, this did not typically last more than 10 seconds before shutting again.  He may have tried to say "Al" when asked for his name, but frankly this  was more of a groan)  Cognition Arousal/Alertness: Lethargic Behavior During Therapy: Flat affect Overall Cognitive Status: Difficult to assess                                        General Comments      Exercises General Exercises - Upper Extremity Shoulder ABduction: PROM;5 reps Digit Composite Flexion: PROM;5 reps General Exercises - Lower Extremity Ankle Circles/Pumps: PROM;10 reps Short Arc Quad: PROM;10 reps Heel Slides: PROM;10 reps (may have felt a trace effort with L leg ext on a rep or 2?) Hip ABduction/ADduction: PROM;10 reps   Assessment/Plan    PT Assessment  (Will initiate PT trial to further assess appropriateness for PT)  PT Problem List Decreased strength;Decreased range of motion;Decreased activity tolerance;Decreased balance;Decreased mobility;Decreased coordination;Decreased cognition;Decreased safety awareness;Decreased knowledge of use of  DME;Cardiopulmonary status limiting activity       PT Treatment Interventions DME instruction;Gait training;Stair training;Functional mobility training;Therapeutic activities;Therapeutic exercise;Balance training;Neuromuscular re-education;Cognitive remediation;Patient/family education;Wheelchair mobility training    PT Goals (Current goals can be found in the Care Plan section)  Acute Rehab PT Goals Patient Stated Goal: unable to state PT Goal Formulation: Patient unable to participate in goal setting Time For Goal Achievement: 07/04/20 Potential to Achieve Goals: Fair    Frequency Min 2X/week   Barriers to discharge        Co-evaluation               AM-PAC PT "6 Clicks" Mobility  Outcome Measure Help needed turning from your back to your side while in a flat bed without using bedrails?: Total Help needed moving from lying on your back to sitting on the side of a flat bed without using bedrails?: Total Help needed moving to and from a bed to a chair (including a wheelchair)?: Total Help needed standing up from a chair using your arms (e.g., wheelchair or bedside chair)?: Total Help needed to walk in hospital room?: Total Help needed climbing 3-5 steps with a railing? : Total 6 Click Score: 6    End of Session Equipment Utilized During Treatment: Oxygen Activity Tolerance: Patient limited by lethargy Patient left: with bed alarm set;with call bell/phone within reach Nurse Communication: Mobility status PT Visit Diagnosis: Unsteadiness on feet (R26.81);Muscle weakness (generalized) (M62.81);Difficulty in walking, not elsewhere classified (R26.2);Adult, failure to thrive (R62.7)    Time: 1005-1031 PT Time Calculation (min) (ACUTE ONLY): 26 min   Charges:   PT Evaluation $PT Re-evaluation: 1 Re-eval PT Treatments $Therapeutic Exercise: 8-22 mins        Malachi Pro, DPT 06/20/2020, 2:58 PM

## 2020-06-20 NOTE — Progress Notes (Signed)
Chaplain Maggie made initial visitation to meet patient who was awake and alert. Patient attempted to communicate verbally without success. He repeatedly said, "I". Space was made for sharing a non-anxious presence and encouragement. Chaplain will follow up and is available as needed.

## 2020-06-20 NOTE — Progress Notes (Signed)
More alert today. Still has garble speech and does not follow commands. Brother [name] staed he was having trouble speaking before he came to the hospital.Dr. Karna Christmas talked with brother.

## 2020-06-20 NOTE — Evaluation (Signed)
Occupational Therapy Evaluation Patient Details Name: Wyatt Ball. MRN: [id number] DOB: 11/28/1960 Today's Date: 06/20/2020    History of Present Illness Wyatt Mehler Montez Hageman. is a 60 y.o. 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  Since hospitalization pt has been intubated (>10 days), extubated 6/15.   Clinical Impression   Pt seen for OT evaluation this date in setting of acute hospitalization d/t AMS requiring intubation. Pt presents this date with general confusion and limited ability to meaningfully participate. PLOF, home setup and support information could not be ascertained at this time d/t pt's impaired communication and no family present. Pt follows ~10% of simple one step commands. Pt visually attends ~40% of the session with increased movement/stimuli. Pt does make attempts to communicate, but is unable to form words or make needs known. Pt is essentially requiring TOTAL A for bed mobility and ADLs at this time. He does appear to attempt to contribute to oral care when prompted by OT and assisted with hand over hand (seems to attempt to grasp oral swab, whether this is only reflexive or intentional is difficulty to ascertain). He is largely confused, but does appear to make some sort of effort to engage and therefore will be picked up for OT on a trial basis to see if stimulation helps improve pt's general interactions and subsequently potentially improve upon his ability to contribute/meaningfully participate in ADLs). Will continue to follow acutely at this time. Should pt progress with his rehab potential, anticipate he will  require extensive rehabilitation services including OT in STR setting.     Follow Up Recommendations  SNF    Equipment Recommendations  Other (comment) (TBD with pt progress and once better home setup/prior equipment information can be addressed)    Recommendations for Other Services       Precautions / Restrictions Precautions Precautions: Fall Restrictions Weight Bearing Restrictions: No      Mobility Bed Mobility               General bed mobility comments: Unable to perform due to weakness in LE's and general decreased ability to follow commands, pt is essentially requirng TOTAL A from staff for rolling as it pertains to essential ADL needs.    Transfers                      Balance                                           ADL either performed or assessed with clinical judgement   ADL Overall ADL's : Needs assistance/impaired                                       General ADL Comments: TOTAL A for all ADLs at this time, does appear to attempt to engage in bed level (high-fowler's position) oral care with swab with OT providing hand over hand. He appears to make an effort to grab the  swab, but is too weak and confused to meaningfully contribute at this time.     Vision   Additional Comments: ~40% visual attn, but unable to formally assess vision or gather baseline d/t cognition     Perception     Praxis      Pertinent Vitals/Pain Pain Assessment: Faces Faces Pain Scale: No hurt     Hand Dominance     Extremity/Trunk Assessment Upper Extremity Assessment Upper Extremity Assessment: Generalized weakness;Difficult to assess due to impaired cognition   Lower Extremity Assessment Lower Extremity Assessment: Generalized weakness;Difficult to assess due to impaired cognition       Communication Communication Communication: Other (comment) (pt does make attempts to verbally communicate including moving his  lips and groaning, but without purposeful communication or any real ability to make needs known at this time.)   Cognition Arousal/Alertness: Lethargic Behavior During Therapy: Flat affect Overall Cognitive Status: Difficult to assess                                 General Comments: pt only follows ~10% of simple one step commands. Pt visually attends ~40% of the session with increased movement/stimuli. Pt does make attempts to communicate, but is unable to form words or make needs known. He is largely confused, but does appear to make some sort of effort to engage and therefore will be picked up for OT on a trial basis to see if stimulation helps improve pt's general interactions and subsequently potentially improve upon his ability to contribute/meaningfully participate in ADLs)   General Comments       Exercises     Shoulder Instructions      Home Living Family/patient expects to be discharged to:: Unsure                                 Additional Comments: Pt unable to communicate effectively to for history taking.      Prior Functioning/Environment          Comments: Pt could not formulate words, showed inability to engage with any conversation, unknown PLOF        OT Problem List: Decreased strength;Decreased range of motion;Decreased activity tolerance;Decreased coordination;Decreased cognition      OT Treatment/Interventions: Self-care/ADL training;DME and/or AE instruction;Therapeutic activities;Balance training;Therapeutic exercise;Energy conservation;Patient/family education;Manual therapy    OT Goals(Current goals can be found in the care plan section) Acute Rehab OT Goals Patient Stated Goal: unable to state OT Goal Formulation: Patient unable to participate in goal setting Time For Goal Achievement: 07/04/20 Potential to Achieve Goals: Poor ADL Goals Additional ADL Goal #1: Pt will follow ~40% of simple one step commands with MAX  cues and increased time to process. Additional ADL Goal #2: Pt will track therapist visuall in the room ~50% of session. Additional ADL Goal #3: Pt will demo some level of attempted participation in bed mobility (at least MAX A +2 with pt contributing on some level such as attempting to reach and hold railing or propulsion off of a lower extremity) to allow for further development of OT POC as it pertains to mobility.  OT Frequency: Min 2X/week   Barriers to D/C:            Co-evaluation              AM-PAC OT "6 Clicks" Daily Activity  Outcome Measure Help from another person eating meals?: Total Help from another person taking care of personal grooming?: Total Help from another person toileting, which includes using toliet, bedpan, or urinal?: Total Help from another person bathing (including washing, rinsing, drying)?: Total Help from another person to put on and taking off regular upper body clothing?: Total Help from another person to put on and taking off regular lower body clothing?: Total 6 Click Score: 6   End of Session    Activity Tolerance:   Patient left: in bed;with call bell/phone within reach;with bed alarm set  OT Visit Diagnosis: Other symptoms and signs involving cognitive function                Time: 0017-4944 OT Time Calculation (min): 13 min Charges:  OT General Charges $OT Visit: 1 Visit OT Evaluation $OT Eval Moderate Complexity: 1 [address] [place], OTR/L ascom [phone] 06/20/20, 6:44 PM

## 2020-06-20 NOTE — Progress Notes (Signed)
[place], [place] 06/20/20  Subjective:   Hospital day # 16  Patient extubated. Still appears to be encephalopathic. Creatinine 4.43. Tolerated dialysis yesterday   Renal: 06/15 0701 - 06/16 0700 In: 359 [I.V.:99; NG/GT:200] Out: 5450 [Urine:4050; Stool:400] Lab Results  Component Value Date   CREATININE 4.43 (H) 06/20/2020   CREATININE 5.87 (H) 06/19/2020   CREATININE 5.14 (H) 06/18/2020     Objective:  Vital signs in last 24 hours:  Temp:  [98.6 F (37 C)-99 F (37.2 C)] 99 F (37.2 C) (06/16 0400) Pulse Rate:  [90-109] 104 (06/16 0500) Resp:  [12-22] 17 (06/16 0100) BP: (141-190)/(63-88) 178/88 (06/16 0500) SpO2:  [95 %-99 %] 98 % (06/16 0734) Weight:  [98.1 kg] 98.1 kg (06/16 0500)  Weight change: 0 kg Filed Weights   06/18/20 0421 06/19/20 0500 06/20/20 0500  Weight: 97.9 kg 98.1 kg 98.1 kg    Intake/Output:    Intake/Output Summary (Last 24 hours) at 06/20/2020 0914 Last data filed at 06/20/2020 0600 Gross per 24 hour  Intake 98.96 ml  Output 4000 ml  Net -3901.04 ml     Physical Exam:  General: No acute distress  HEENT anicteric, oral mucosa moist  Pulm/lungs coarse breath sounds, normal effort  CVS/Heart regular rhythm, no rubs  Abdomen:  Soft, nontender  Extremities: 1+ BUE peripheral edema, trace BLE edema  Neurologic: Awake, alert  Skin: Warm, dry  Coud catheter in place  Rectal tube in place   Basic Metabolic Panel:  Recent Labs  Lab 06/16/20 0509 06/17/20 0537 06/18/20 0408 06/19/20 0448 06/20/20 0417  NA 146* 146* 143 148* 144  K 4.8 5.4* 4.9 5.3* 4.4  CL 111 110 108 110 106  CO2 22 22 23 23 22   GLUCOSE 204* 172* 135* 179* 119*  BUN 161* 185* 152* 174* 112*  CREATININE 6.03* 6.06* 5.14* 5.87* 4.43*  CALCIUM 8.4* 8.5* 8.5* 8.5* 8.5*  MG 2.7* 2.9* 2.7* 2.7* 2.5*  PHOS 11.5* 12.1* 11.1* 12.8* 10.6*      CBC: Recent Labs  Lab 06/16/20 0509 06/17/20 0537 06/18/20 0408 06/19/20 0448  06/20/20 0417  WBC 7.8 7.3 8.9 7.2 10.7*  NEUTROABS 6.1 5.7 7.2 5.9 9.0*  HGB 8.6* 8.2* 9.0* 8.1* 8.9*  HCT 25.4* 24.7* 26.1* 23.5* 27.2*  MCV 86.4 87.3 85.6 86.7 89.2  PLT 387 369 391 309 323       Lab Results  Component Value Date   HEPBSAG NON REACTIVE 06/09/2020   HEPBSAB NON REACTIVE 06/09/2020      Microbiology:  Recent Results (from the past 240 hour(s))  Culture, Respiratory w Gram Stain     Status: None (Preliminary result)   Collection Time: 06/18/20  4:43 PM   Specimen: Tracheal Aspirate; Respiratory  Result Value Ref Range Status   Specimen Description   Final    TRACHEAL ASPIRATE Performed at [facility], [address], [place], [place] [address]    Special Requests   Final    NONE Performed at [facility], [address] [place], [place] [address]    Gram Stain   Final    MODERATE WBC PRESENT,BOTH PMN AND MONONUCLEAR FEW YEAST RARE GRAM NEGATIVE RODS    Culture   Final    NO GROWTH < 12 HOURS Performed at [facility], [place] [address], [place], [place] [address]    Report Status PENDING  Incomplete     Coagulation Studies: No results for input(s): LABPROT, INR in the last  72 hours.  Urinalysis: No results for input(s): COLORURINE, LABSPEC, PHURINE, GLUCOSEU, HGBUR, BILIRUBINUR, KETONESUR, PROTEINUR, UROBILINOGEN, NITRITE, LEUKOCYTESUR in the last 72 hours.  Invalid input(s): APPERANCEUR    Imaging: DG Abd 1 View  Result Date: 06/18/2020 CLINICAL DATA:  OG placement EXAM: ABDOMEN - 1 VIEW COMPARISON:  06/07/2020 FINDINGS: Esophageal tube tip and side port overlie the mid to distal stomach. Mild increased small and large bowel gas without significant distention. Bilateral hip replacements. No radiopaque calculi. IMPRESSION: 1. Esophageal tube tip overlies the distal stomach 2. Nonobstructed bowel-gas pattern Electronically Signed   By: Donavan Foil M.D.   On: 06/18/2020 17:54   US Abdomen Complete  Result Date:  06/18/2020 CLINICAL DATA:  Transaminitis EXAM: ABDOMEN ULTRASOUND COMPLETE COMPARISON:  CT Jun 04, 2020 FINDINGS: Gallbladder: No gallstones or wall thickening visualized. No sonographic Murphy sign noted by sonographer. Common bile duct: Diameter: 2 mm Liver: Evaluation of the hepatic parenchyma is somewhat limited by patient condition. No focal lesion identified. Within normal limits in parenchymal echogenicity. Portal vein is patent on color Doppler imaging with normal direction of blood flow towards the liver. IVC: No abnormality visualized. Pancreas: Not well visualized. Spleen: Size and appearance within normal limits. Right Kidney: Length: 10.7 cm. Increased echogenicity. No mass or hydronephrosis visualized. Left Kidney: Length: 14.4 cm. Increased echogenicity. No mass or hydronephrosis visualized. Abdominal aorta: No aneurysm visualized. Other findings: None. IMPRESSION: No acute sonographic abnormality identified, on this study which limited given patient's condition. Electronically Signed   By: Dahlia Bailiff MD   On: 06/18/2020 09:45      Medications:    sodium chloride Stopped (06/19/20 0717)    amLODipine  5 mg Oral Daily   budesonide (PULMICORT) nebulizer solution  0.5 mg Nebulization BID   chlorhexidine gluconate (MEDLINE KIT)  15 mL Mouth Rinse BID   Chlorhexidine Gluconate Cloth  6 each Topical Daily   cloNIDine  0.2 mg Transdermal Weekly   furosemide  40 mg Intravenous Daily   heparin injection (subcutaneous)  5,000 Units Subcutaneous Q8H   insulin aspart  0-20 Units Subcutaneous Q4H   insulin glargine  20 Units Subcutaneous Daily   ipratropium-albuterol  3 mL Nebulization Q6H   labetalol  200 mg Oral BID   mouth rinse  15 mL Mouth Rinse 10 times per day   methylPREDNISolone (SOLU-MEDROL) injection  20 mg Intravenous Daily   multivitamin  1 tablet Oral QHS   pantoprazole (PROTONIX) IV  40 mg Intravenous Q24H   prazosin  4 mg Oral QHS   sodium bicarbonate  1,300 mg Oral BID    sodium chloride flush  10-40 mL Intracatheter Q12H   traZODone  150 mg Oral QHS   sodium chloride, diazepam, hydrALAZINE, ondansetron **OR** ondansetron (ZOFRAN) IV, oxyCODONE, prazosin, sodium chloride flush  Assessment/ Plan:  60 y.o. male with  medical problems of   Hypertension, PTSD, diabetes, migraines, GERD, neurogenic bladder secondary to remote history of transverse myelitis, requires self-catheterization and has frequent UTIs, opioids for chronic pain  admitted on 06/04/2020 for Sepsis ([place]) [A41.9] Altered mental status, unspecified altered mental status type [R41.82] Community acquired pneumonia, unspecified laterality [J18.9]   #Acute kidney injury, proteinuria, hematuria Baseline creatinine of 1.26 at admission Increasing creatinine trends since June 3. AKI is likely multifactorial secondary to ongoing sepsis as well as IV contrast exposure on June 3 Urinalysis at admission showed glucosuria, greater than 300 proteinuria, 6-10 RBCs, moderate hemoglobin noted on dipstick, 11-20 WBCs Renal imaging as noted above.  Patient had  CT abdomen pelvis without contrast.  No hydronephrosis.  Bladder was unremarkable. UPC 2.55 gm RBC 21-50 cc June 09, 2020: ANA negative, ESR normal, complement C3-C4 normal, kappa lambda ratio normal, hepatitis B and hepatitis C studies negative, HIV negative, crypto antigen negative Hemodialysis started on June 6, had treatment on 6/6, 6/9, 6/13.   Plan: Tolerated dialysis well yesterday Improved BUN and more alert today Will continue to determine need for dialysis daily  #Acute respiratory failure Now extubated.  O2 2L [place]   #Dependent peripheral edema Improved edema in lower extremities  #Hyperkalemia. Potassium at 4.4.      LOS: [place] 6/16/20229:14 [place], [place]  Note: This note was prepared with Dragon dictation. Any transcription errors are unintentional

## 2020-06-21 ENCOUNTER — Inpatient Hospital Stay: Payer: No Typology Code available for payment source

## 2020-06-21 LAB — GLUCOSE, CAPILLARY
Glucose-Capillary: 109 mg/dL — ABNORMAL HIGH (ref 70–99)
Glucose-Capillary: 111 mg/dL — ABNORMAL HIGH (ref 70–99)
Glucose-Capillary: 123 mg/dL — ABNORMAL HIGH (ref 70–99)
Glucose-Capillary: 125 mg/dL — ABNORMAL HIGH (ref 70–99)
Glucose-Capillary: 132 mg/dL — ABNORMAL HIGH (ref 70–99)
Glucose-Capillary: 162 mg/dL — ABNORMAL HIGH (ref 70–99)
Glucose-Capillary: 99 mg/dL (ref 70–99)

## 2020-06-21 LAB — CBC WITH DIFFERENTIAL/PLATELET
Abs Immature Granulocytes: 0.03 10*3/uL (ref 0.00–0.07)
Basophils Absolute: 0.1 10*3/uL (ref 0.0–0.1)
Basophils Relative: 1 %
Eosinophils Absolute: 0 10*3/uL (ref 0.0–0.5)
Eosinophils Relative: 0 %
HCT: 28.9 % — ABNORMAL LOW (ref 39.0–52.0)
Hemoglobin: 9.6 g/dL — ABNORMAL LOW (ref 13.0–17.0)
Immature Granulocytes: 0 %
Lymphocytes Relative: 8 %
Lymphs Abs: 0.7 10*3/uL (ref 0.7–4.0)
MCH: 29.4 pg (ref 26.0–34.0)
MCHC: 33.2 g/dL (ref 30.0–36.0)
MCV: 88.7 fL (ref 80.0–100.0)
Monocytes Absolute: 0.6 10*3/uL (ref 0.1–1.0)
Monocytes Relative: 7 %
Neutro Abs: 7.1 10*3/uL (ref 1.7–7.7)
Neutrophils Relative %: 84 %
Platelets: 322 10*3/uL (ref 150–400)
RBC: 3.26 MIL/uL — ABNORMAL LOW (ref 4.22–5.81)
RDW: 15.6 % — ABNORMAL HIGH (ref 11.5–15.5)
WBC: 8.5 10*3/uL (ref 4.0–10.5)
nRBC: 0 % (ref 0.0–0.2)

## 2020-06-21 LAB — BASIC METABOLIC PANEL
Anion gap: 18 — ABNORMAL HIGH (ref 5–15)
BUN: 131 mg/dL — ABNORMAL HIGH (ref 8–23)
CO2: 21 mmol/L — ABNORMAL LOW (ref 22–32)
Calcium: 8.9 mg/dL (ref 8.9–10.3)
Chloride: 110 mmol/L (ref 98–111)
Creatinine, Ser: 5.19 mg/dL — ABNORMAL HIGH (ref 0.61–1.24)
GFR, Estimated: 12 mL/min — ABNORMAL LOW (ref >60)
Glucose, Bld: 134 mg/dL — ABNORMAL HIGH (ref 70–99)
Potassium: 4.3 mmol/L (ref 3.5–5.1)
Sodium: 149 mmol/L — ABNORMAL HIGH (ref 135–145)

## 2020-06-21 LAB — CULTURE, RESPIRATORY W GRAM STAIN

## 2020-06-21 LAB — MAGNESIUM: Magnesium: 2.8 mg/dL — ABNORMAL HIGH (ref 1.7–2.4)

## 2020-06-21 LAB — PHOSPHORUS: Phosphorus: 11.9 mg/dL — ABNORMAL HIGH (ref 2.5–4.6)

## 2020-06-21 LAB — VITAMIN B12: Vitamin B-12: 612 pg/mL (ref 180–914)

## 2020-06-21 IMAGING — DX DG ABDOMEN 1V
1 series · 1 of 1 positions shown · non-contrast
Comparison: Radiograph [DATE]

CLINICAL DATA: Nasogastric tube placement.

EXAM:
ABDOMEN - 1 VIEW

[abdomen supine]
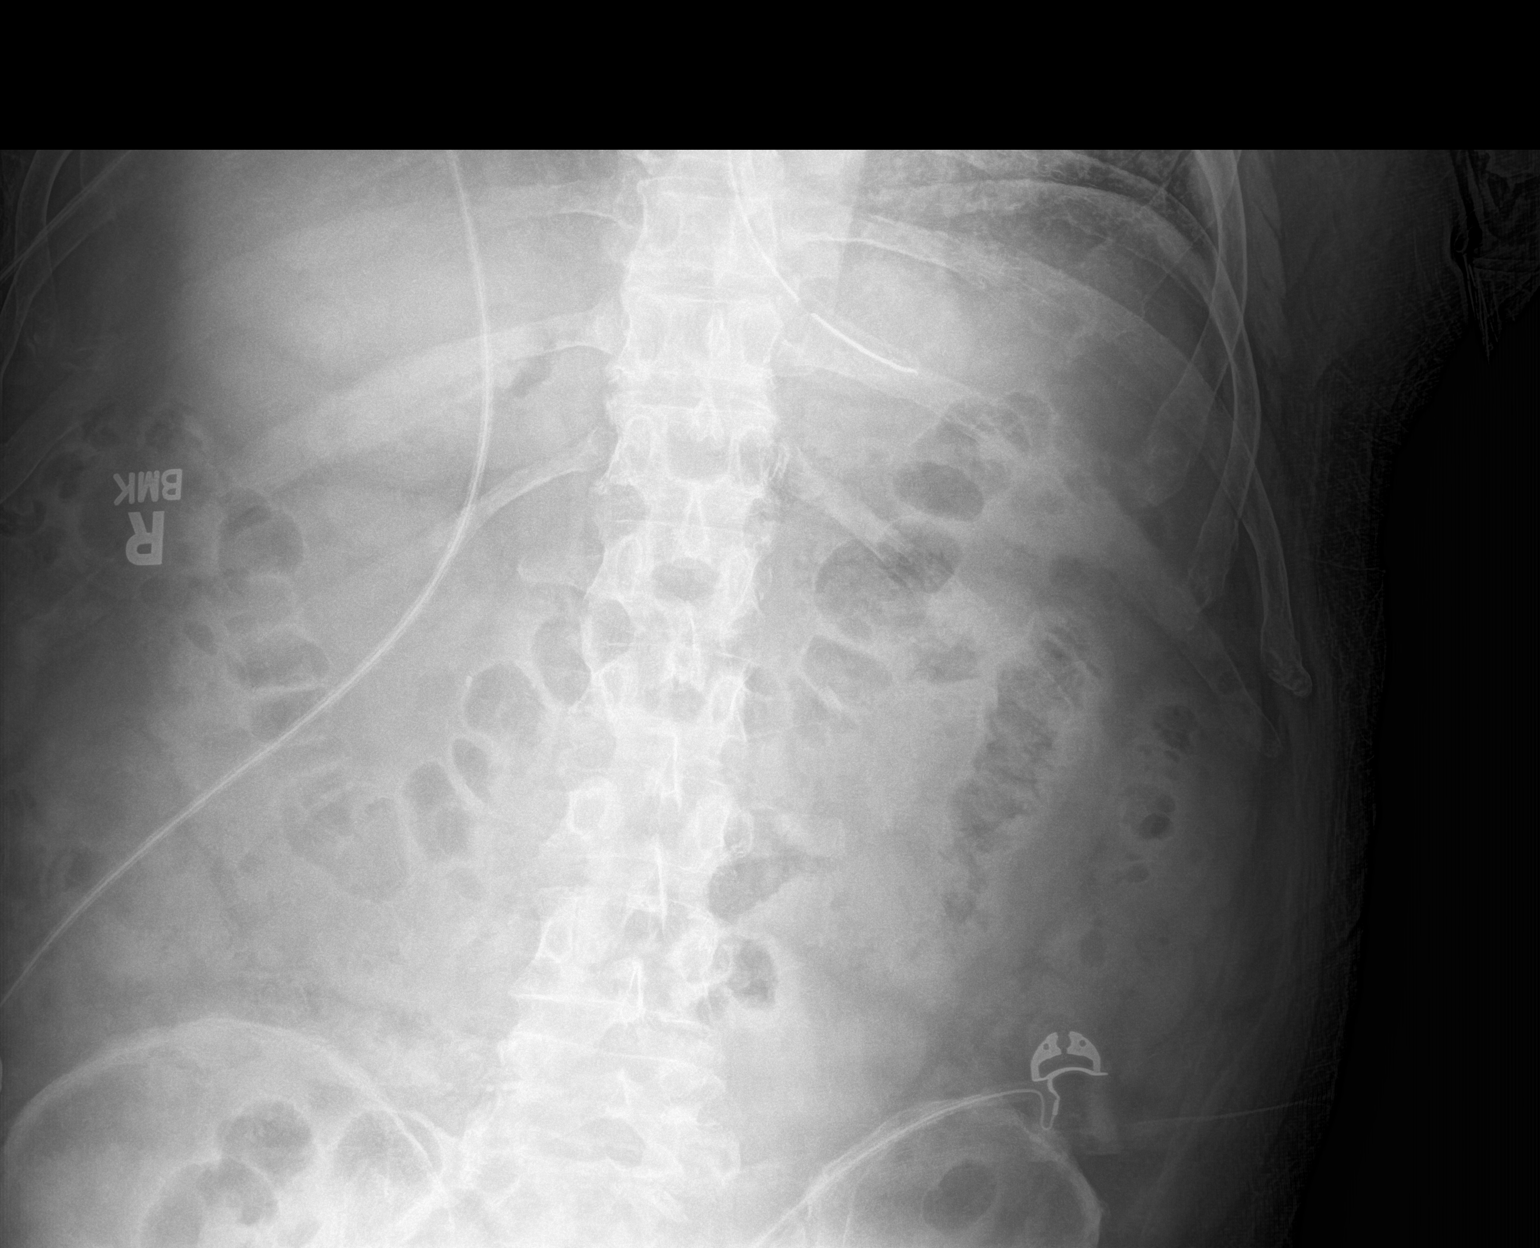

[1 of 1 positions shown; findings below may reference images not displayed]

FINDINGS: Tip of the enteric tube is below the diaphragm in the stomach, the
side port is just beyond the gastroesophageal junction. Decreased
gaseous colonic distension from prior exam. No evidence of
obstruction.
IMPRESSION: Tip of the enteric tube below the diaphragm in the stomach, side
port just beyond the gastroesophageal junction.

## 2020-06-21 MED ORDER — ONDANSETRON HCL 4 MG/2ML IJ SOLN
4.0000 mg | Freq: Four times a day (QID) | INTRAMUSCULAR | Status: DC | PRN
  Administered 2020-07-22: 4 mg via INTRAVENOUS
  Filled 2020-06-21 (×2): qty 2

## 2020-06-21 MED ORDER — SODIUM BICARBONATE 650 MG PO TABS
1300.0000 mg | ORAL_TABLET | Freq: Two times a day (BID) | ORAL | Status: DC
  Administered 2020-06-21: 1300 mg
  Filled 2020-06-21 (×3): qty 2

## 2020-06-21 MED ORDER — PRAZOSIN HCL 2 MG PO CAPS
4.0000 mg | ORAL_CAPSULE | Freq: Every day | ORAL | Status: DC
  Administered 2020-06-21 – 2020-08-01 (×42): 4 mg
  Filled 2020-06-21 (×46): qty 2

## 2020-06-21 MED ORDER — LABETALOL HCL 5 MG/ML IV SOLN
10.0000 mg | INTRAVENOUS | Status: DC | PRN
  Administered 2020-06-21: 10 mg via INTRAVENOUS
  Administered 2020-06-22 – 2020-06-23 (×5): 20 mg via INTRAVENOUS
  Administered 2020-07-04: 10 mg via INTRAVENOUS
  Filled 2020-06-21 (×8): qty 4

## 2020-06-21 MED ORDER — AMLODIPINE BESYLATE 5 MG PO TABS
5.0000 mg | ORAL_TABLET | Freq: Every day | ORAL | Status: DC
  Administered 2020-06-22 – 2020-07-14 (×21): 5 mg
  Filled 2020-06-21 (×21): qty 1

## 2020-06-21 MED ORDER — HYDRALAZINE HCL 20 MG/ML IJ SOLN
10.0000 mg | INTRAMUSCULAR | Status: DC | PRN
  Administered 2020-06-22 (×2): 20 mg via INTRAVENOUS
  Filled 2020-06-21 (×2): qty 1

## 2020-06-21 MED ORDER — NEPRO/CARBSTEADY PO LIQD
1000.0000 mL | ORAL | Status: DC
  Administered 2020-06-22: 1000 mL

## 2020-06-21 MED ORDER — ACETAMINOPHEN 10 MG/ML IV SOLN
1000.0000 mg | Freq: Once | INTRAVENOUS | Status: AC
  Administered 2020-06-21: 1000 mg via INTRAVENOUS
  Filled 2020-06-21: qty 100

## 2020-06-21 MED ORDER — ONDANSETRON HCL 4 MG PO TABS
4.0000 mg | ORAL_TABLET | Freq: Four times a day (QID) | ORAL | Status: DC | PRN
  Administered 2020-07-20 – 2020-07-21 (×2): 4 mg
  Filled 2020-06-21 (×2): qty 1

## 2020-06-21 MED ORDER — PRAZOSIN HCL 1 MG PO CAPS
1.0000 mg | ORAL_CAPSULE | Freq: Two times a day (BID) | ORAL | Status: DC | PRN
  Administered 2020-06-22 – 2020-06-25 (×3): 1 mg
  Filled 2020-06-21 (×5): qty 1

## 2020-06-21 MED ORDER — HEPARIN SODIUM (PORCINE) 1000 UNIT/ML IJ SOLN
1300.0000 [IU] | INTRAMUSCULAR | Status: DC | PRN
  Administered 2020-06-21: 1300 [IU] via INTRAVENOUS
  Filled 2020-06-21: qty 1.3

## 2020-06-21 MED ORDER — LABETALOL HCL 5 MG/ML IV SOLN
10.0000 mg | INTRAVENOUS | Status: DC | PRN
  Administered 2020-06-21: 10 mg via INTRAVENOUS
  Filled 2020-06-21: qty 4

## 2020-06-21 MED ORDER — RENA-VITE PO TABS
1.0000 | ORAL_TABLET | Freq: Every day | ORAL | Status: DC
  Administered 2020-06-21 – 2020-08-01 (×42): 1
  Filled 2020-06-21 (×45): qty 1

## 2020-06-21 MED ORDER — FREE WATER
30.0000 mL | Status: DC
  Administered 2020-06-21 – 2020-06-23 (×10): 30 mL

## 2020-06-21 MED ORDER — PROSOURCE TF PO LIQD
45.0000 mL | Freq: Every day | ORAL | Status: DC
  Administered 2020-06-22 – 2020-07-04 (×11): 45 mL
  Filled 2020-06-21 (×12): qty 45

## 2020-06-21 MED ORDER — JUVEN PO PACK
1.0000 | PACK | Freq: Two times a day (BID) | ORAL | Status: DC
  Administered 2020-06-21 – 2020-08-01 (×67): 1

## 2020-06-21 MED ORDER — CLONIDINE HCL 0.3 MG/24HR TD PTWK
0.3000 mg | MEDICATED_PATCH | TRANSDERMAL | Status: DC
  Administered 2020-06-21 – 2020-07-26 (×6): 0.3 mg via TRANSDERMAL
  Filled 2020-06-21 (×8): qty 1

## 2020-06-21 NOTE — Progress Notes (Signed)
Neuro: alert, not interactive, moves extremity slightly, does not follow any commands Resp: stable on room air CV: afebrile, HD complete today, hypertension throughout the day-hydralazine/labetalol given GIGU:foley in place, no BM, no emesis, NPO-failed swallow eval with speech Skin:sacral wound assessed, scant drainage, otherwise intact with scattered bruising Social: No contact with family today

## 2020-06-21 NOTE — Progress Notes (Signed)
[place], [place] 06/21/20  Subjective:   Hospital day # 17  Patient due for hemodialysis treatment again today. Currently awake but not speaking very much. Creatinine back up to 5.19 today.   Renal: 06/16 0701 - 06/17 0700 In: 30 [I.V.:30] Out: 1475 [Urine:1475] Lab Results  Component Value Date   CREATININE 5.19 (H) 06/21/2020   CREATININE 4.43 (H) 06/20/2020   CREATININE 5.87 (H) 06/19/2020     Objective:  Vital signs in last 24 hours:  Temp:  [97.7 F (36.5 C)-98.8 F (37.1 C)] 97.7 F (36.5 C) (06/17 0530) Pulse Rate:  [86-117] 95 (06/17 0800) Resp:  [10-27] 24 (06/17 0800) BP: (147-191)/(63-98) 191/98 (06/17 0839) SpO2:  [88 %-97 %] 88 % (06/17 0800) Weight:  [85.2 kg] 85.2 kg (06/17 0530)  Weight change: -12.9 kg Filed Weights   06/19/20 0500 06/20/20 0500 06/21/20 0530  Weight: 98.1 kg 98.1 kg 85.2 kg    Intake/Output:    Intake/Output Summary (Last 24 hours) at 06/21/2020 1050 Last data filed at 06/21/2020 0200 Gross per 24 hour  Intake --  Output 1125 ml  Net -1125 ml     Physical Exam:  General: No acute distress  HEENT anicteric, oral mucosa moist  Pulm/lungs Scattered rhonchi, normal effort  CVS/Heart regular rhythm, no rubs  Abdomen:  Soft, nontender  Extremities: 1+ BUE peripheral edema, trace BLE edema  Neurologic: Awake, alert  Skin: Warm, dry  Coud catheter in place  Rectal tube in place   Basic Metabolic Panel:  Recent Labs  Lab 06/17/20 0537 06/18/20 0408 06/19/20 0448 06/20/20 0417 06/21/20 0606  NA 146* 143 148* 144 149*  K 5.4* 4.9 5.3* 4.4 4.3  CL 110 108 110 106 110  CO2 _0 21*  GLUCOSE 172* 135* 179* 119* 134*  BUN 185* 152* 174* 112* 131*  CREATININE 6.06* 5.14* 5.87* 4.43* 5.19*  CALCIUM 8.5* 8.5* 8.5* 8.5* 8.9  MG 2.9* 2.7* 2.7* 2.5* 2.8*  PHOS 12.1* 11.1* 12.8* 10.6* 11.9*      CBC: Recent Labs  Lab 06/17/20 0537 06/18/20 0408 06/19/20 0448 06/20/20 0417  06/21/20 0606  WBC 7.3 8.9 7.2 10.7* 8.5  NEUTROABS 5.7 7.2 5.9 9.0* 7.1  HGB 8.2* 9.0* 8.1* 8.9* 9.6*  HCT 24.7* 26.1* 23.5* 27.2* 28.9*  MCV 87.3 85.6 86.7 89.2 88.7  PLT 369 391 309 323 322       Lab Results  Component Value Date   HEPBSAG NON REACTIVE 06/09/2020   HEPBSAB NON REACTIVE 06/09/2020      Microbiology:  Recent Results (from the past 240 hour(s))  Culture, Respiratory w Gram Stain     Status: None (Preliminary result)   Collection Time: 06/18/20  4:43 PM   Specimen: Tracheal Aspirate; Respiratory  Result Value Ref Range Status   Specimen Description   Final    TRACHEAL ASPIRATE Performed at [facility], [address] [place], [place] [address]    Special Requests   Final    NONE Performed at [facility], [address] [place], [place] [address]    Gram Stain   Final    MODERATE WBC PRESENT,BOTH PMN AND MONONUCLEAR FEW YEAST RARE GRAM NEGATIVE RODS    Culture   Final    CULTURE REINCUBATED FOR BETTER GROWTH Performed at [facility], [place] [address], [place], [place] [address]    Report Status PENDING  Incomplete     Coagulation Studies: No results for input(s): LABPROT,  INR in the last 72 hours.  Urinalysis: No results for input(s): COLORURINE, LABSPEC, PHURINE, GLUCOSEU, HGBUR, BILIRUBINUR, KETONESUR, PROTEINUR, UROBILINOGEN, NITRITE, LEUKOCYTESUR in the last 72 hours.  Invalid input(s): APPERANCEUR    Imaging: No results found.    Medications:    sodium chloride Stopped (06/19/20 0717)    amLODipine  5 mg Oral Daily   chlorhexidine gluconate (MEDLINE KIT)  15 mL Mouth Rinse BID   Chlorhexidine Gluconate Cloth  6 each Topical Daily   cloNIDine  0.3 mg Transdermal Weekly   heparin injection (subcutaneous)  5,000 Units Subcutaneous Q8H   insulin aspart  0-20 Units Subcutaneous Q4H   insulin glargine  20 Units Subcutaneous Daily   multivitamin  1 tablet Oral QHS   pantoprazole (PROTONIX) IV  40 mg  Intravenous Q24H   prazosin  4 mg Oral QHS   sodium bicarbonate  1,300 mg Oral BID   sodium chloride flush  10-40 mL Intracatheter Q12H   traZODone  150 mg Oral QHS   sodium chloride, hydrALAZINE, ondansetron **OR** ondansetron (ZOFRAN) IV, oxyCODONE, prazosin, sodium chloride flush  Assessment/ Plan:  60 y.o. male with  medical problems of   Hypertension, PTSD, diabetes, migraines, GERD, neurogenic bladder secondary to remote history of transverse myelitis, requires self-catheterization and has frequent UTIs, opioids for chronic pain  admitted on 06/04/2020 for Sepsis ([facility]) [A41.9] Altered mental status, unspecified altered mental status type [R41.82] Community acquired pneumonia, unspecified laterality [J18.9]   #Acute kidney injury, proteinuria, hematuria Baseline creatinine of 1.26 at admission Increasing creatinine trends since June 3. AKI is likely multifactorial secondary to ongoing sepsis as well as IV contrast exposure on June 3 Urinalysis at admission showed glucosuria, greater than 300 proteinuria, 6-10 RBCs, moderate hemoglobin noted on dipstick, 11-20 WBCs Renal imaging as noted above.  Patient had CT abdomen pelvis without contrast.  No hydronephrosis.  Bladder was unremarkable. UPC 2.55 gm RBC 21-50 cc June 09, 2020: ANA negative, ESR normal, complement C3-C4 normal, kappa lambda ratio normal, hepatitis B and hepatitis C studies negative, HIV negative, crypto antigen negative Hemodialysis started on June 6, had treatment on 6/6, 6/9, 6/13.   Plan: We will plan for another dialysis session today.  Ultrafiltration target 1 kg.  Performing dialysis mainly for uremia as BUN 131 with a creatinine of 5.19.  #Acute respiratory failure Continues to do well post extubation.   #Dependent peripheral edema Improved edema in lower extremities, ultrafiltration target 1 kg today.  #Hyperkalemia. Now resolved.  Potassium 4.3.    LOS: 89 Franceska Strahm 6/17/202210:50  AM  [place], [place]  Note: This note was prepared with Dragon dictation. Any transcription errors are unintentional

## 2020-06-21 NOTE — Progress Notes (Signed)
NG tube placed by this RN and charge nurse [place] assisting. Initial verification by auscultation and follow up x-ray prior to use. Pt tolerated placement w/o difficulty. NG positioned at left nare 57cm. Post portable KUB- NP [place] verbal to advance 5cm and proceed with normal use.   NG now positioned at 62cm.

## 2020-06-21 NOTE — Evaluation (Addendum)
Clinical/Bedside Swallow Evaluation Patient Details  Name: Wyatt Ball. MRN: [id number] Date of Birth: 1960-03-13  Today's Date: 06/21/2020 Time: SLP Start Time (ACUTE ONLY): 1140 SLP Stop Time (ACUTE ONLY): 1240 SLP Time Calculation (min) (ACUTE ONLY): 60 min  Past Medical History:  Past Medical History:  Diagnosis Date   Anxiety    DM (diabetes mellitus) (HCC)    HTN (hypertension)    Transverse myelitis (HCC)    Past Surgical History:  Past Surgical History:  Procedure Laterality Date   TONSILLECTOMY     TOTAL HIP ARTHROPLASTY     HPI:  Wyatt [place]cl[place] Ball. is a 60 y.o. 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 CT chest abdomen and pelvis without contrast consistent with pneumonia. Multiple nodules left upper lobe likely infectious or inflammatory with follow-up imaging recommended to document resolution. Cardiomegaly with trace pericardial effusion.  Pt had a decline in status and was transferred to CCU on 6//2022 and orally intubated. He extubated/reintubated during this time until finally extubated on 06/19/2020. He is on [place] O2 support now, and though verbal yesterday somewhat, he is non-communicative with encephalopathy currently. MD unsure if psychiatric in nature and is consulted Neurology.   Assessment / Plan / Recommendation Clinical Impression  Pt presents w/ severe encephalopathy c/b decreased attention to task, decreased oral awareness and engagement w/ oral prep tasks, inability to engage w/ functional tasks and problem-solve to take food/drink from spoon or drink from a straw, and inability to follow basic  simple directions. When offered trials of ice chips, thin and nectar liquids, and puree via tsp/pinched straw at lips, pt demonstrated significant Cognitive disengagement often letting the boluses lay anteriorly in mouth, further open mouth allowing anteriorl leakage, or did not open mouth to accept po's. If any bolus residue remained orally from po trials attempted, no lingual-oral prep movements or bolus management noted; no pharyngeal swallowing appreciated.   Pt often turned head to the side w/ increased movements at times. He did open his mouth to the straw 2x but could not draw on the straw to sip liquid. He did not follow any instructions; movements and responses were non-purposeful suggestive of a [place] Level 2: ("Generalized response - Inconsistent, non-purposeful, nonspecific response to all stimuli. Responses may be delayed").  Pt was given Max verbal/tactile/visual cues and encouragement during po tasks. Pt's speech was incoherent; non-intentional to engagement/questions. Pt did not follow basic commands.   Given pt's currently declined mentation and high risk for aspiration/choking w/ any oral intake, recommend pt continue NPO status w/ frequent oral care for hygiene and stimulation of swallowing. ST to follow for PO readiness; trials at bedside. Recommend Dietician f/u for enteral support via NGT d/t pt's significantly declined Cognitive status at this time. MD and NSG updated. SLP Visit Diagnosis: Dysphagia, oropharyngeal phase (R13.12) (Cognitive decline and impact)    Aspiration Risk  Moderate aspiration risk;Risk for inadequate nutrition/hydration    Diet Recommendation  NPO w/ frequent oral care for hygiene and stimulation of swallowing; aspiration precautions.   Medication Administration: Via alternative means    Other  Recommendations Recommended Consults:  (Dietician f/u; Neurology f/u; Palliative care f/u for GOC) Oral  Care Recommendations: Oral care QID;Staff/trained caregiver  to provide oral care Other Recommendations:  (TBD)   Follow up Recommendations  (TBD)      Frequency and Duration min 3x week  2 weeks       Prognosis Prognosis for Safe Diet Advancement: Guarded Barriers to Reach Goals: Cognitive deficits;Language deficits;Time post onset;Severity of deficits;Behavior Barriers/Prognosis Comment: poor cooperation; reduced Cogntiive awareness      Swallow Study   General Date of Onset: 06/04/20 HPI: Wyatt [place] Ball. is a 60 y.o. 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 CT chest abdomen and pelvis without contrast consistent with pneumonia. Multiple nodules left upper lobe likely infectious or inflammatory with follow-up imaging recommended to document resolution. Cardiomegaly with trace pericardial effusion.  Pt had a decline in status and was transferred to CCU on 6//2022 and orally intubated. He extubated/reintubated during this time until finally extubated on 06/19/2020. He is on [place] O2 support now, and though verbal yesterday somewhat, he is non-communicative with encephalopathy currently. MD unsure if psychiatric in nature and is consulted Neurology. Type of Study: Bedside Swallow Evaluation Previous Swallow Assessment: 06/06/2020 Diet Prior to this Study: NPO Temperature Spikes Noted: No (wbc 8.5) Respiratory Status: Nasal cannula (2L) History of Recent Intubation: Yes Length of Intubations (days): 13 days Date extubated: 06/19/20 Behavior/Cognition: Alert;Confused;Distractible;Requires cueing;Doesn't follow directions (nonverbal) Oral Cavity Assessment: Dry (sticky at lips, anteriorly) Oral  Care Completed by SLP:  (attempted but pt noncooperative) Oral Cavity - Dentition: Poor condition Vision:  (n/a) Self-Feeding Abilities: Total assist Patient Positioning: Upright in bed (head forward) Baseline Vocal Quality: Low vocal intensity (mumbled/muttered speech - incoherent) Volitional Cough: Cognitively unable to elicit Volitional Swallow: Unable to elicit    Oral/Motor/Sensory Function Overall Oral Motor/Sensory Function:  (CNT d/t pt's poor cooperation)   [place] chips: Impaired Presentation: Spoon (5 trials fed) Oral Phase Impairments: Poor awareness of bolus;Reduced labial seal;Reduced lingual movement/coordination Other Comments: ecpectorated   Thin Liquid Thin Liquid: Impaired Presentation: Spoon (4 trials; pinched straw - 2) Oral Phase Impairments: Reduced labial seal;Reduced lingual movement/coordination;Poor awareness of bolus Oral Phase Functional Implications:  (bolus loss) Other Comments: no swallow appreciated    Nectar Thick Nectar Thick Liquid: Impaired Presentation: Spoon (2 trials fed) Oral Phase Impairments: Reduced labial seal;Reduced lingual movement/coordination;Poor awareness of bolus Oral phase functional implications:  (bolus loss)   Honey Thick Honey Thick Liquid: Not tested   Puree Puree: Impaired Presentation: Spoon (2 trials fed) Oral Phase Impairments: Reduced labial seal;Reduced lingual movement/coordination;Poor awareness of bolus Oral Phase Functional Implications:  (not accepted) Other Comments: residue on lips   Solid     Solid: Not tested        Jerilynn Som, MS, CCC-SLP Speech Language Pathologist [facility] [phone] Wyatt Ball 06/21/2020,4:56 PM

## 2020-06-21 NOTE — Progress Notes (Signed)
NAME:  Wyatt Ball., MRN:  [id number], DOB:  12/05/1960, LOS: 30 ADMISSION DATE:  06/04/2020 60 y.o. male with medical history significant for HTN, PTSD, diabetes, migraines, GERD, neurogenic bladder secondary to remote history of transverse myelitis, who self catheterizes and has frequent UTIs as well as chronic back pain on chronic opiates who at baseline is independent - brought to the ER with a 3-day history of altered mental status.   + cough, shortness of breath and fever as well as vomiting and diarrhea and started becoming increasingly confused in the 24 hours prior to arrival.       Unable to get any meaningful history from patient.   ED course: On arrival, temperature 99.3, BP 167/67, pulse 99, respirations 18 with O2 sat 96% on room air.  Blood work significant for leukocytosis of 13,600 with lactic acid of 1.2.  Ammonia level normal at 15, EtOH less than 10.  Sodium 130, potassium 3.2, glucose 249 creatinine 1.06.  Urinalysis with 80 ketones and rare bacteria.  UDS positive for opiates and tricyclic's.  COVID and flu negative       Imaging:   CT chest abdomen and pelvis without contrast consistent with pneumonia.  Multiple nodules left upper lobe likely infectious or inflammatory with follow-up imaging recommended to document resolution.   Cardiomegaly with trace pericardial effusion   Patient was started on sepsis fluids and broad-spectrum antibiotics initially for sepsis of unknown source.       Significant Hospital Events: Including procedures, antibiotic start and stop dates in addition to other pertinent events   5/31 admitted for sepsis present on admission for Left lung pneumonia 6/1 progressive confusion and hypoxia 6/2 transferred to ICU, INTUBATED, S/P BRONCH 6/3 high fevers, remains ill, SELF EXTUBATED 6/3 emergently re-intubated 6/5 failure to wean from v ent 6/6 failure to wean from vent, VASC CATH PLACED 6/7 HD started remains critically ill 6/8  remains on vent 6/9 waiting for family for SAT 06/17/2020-patient had dialysis today without acute events.  He was unable to pass SBT even post HD due to Care plan reviewed with infectious disease today.  06/18/20- patient still unable to follow verbal communication.  Repeat SBT today with goal for extubation   06/19/20-  patient is improved, will attempt to liberate from MV today post dialysis and SBT.  06/20/20- patient is improved, he is difficult to engage in conversation, I was able to capture his attention with negotiating food items and he did speak with me some. Unclear if there is psychiatric disease. 06/21/20- patient on room air now, he is non-communicative with encephalopathy, plan for additional dialysis and official swallow evaluation.     CBC    Component Value Date/Time   WBC 8.5 06/21/2020 0606   RBC 3.26 (L) 06/21/2020 0606   HGB 9.6 (L) 06/21/2020 0606   HCT 28.9 (L) 06/21/2020 0606   PLT 322 06/21/2020 0606   MCV 88.7 06/21/2020 0606   MCH 29.4 06/21/2020 0606   MCHC 33.2 06/21/2020 0606   RDW 15.6 (H) 06/21/2020 0606   LYMPHSABS 0.7 06/21/2020 0606   MONOABS 0.6 06/21/2020 0606   EOSABS 0.0 06/21/2020 0606   BASOSABS 0.1 06/21/2020 0606     BMP Latest Ref Rng & Units 06/21/2020 06/20/2020 06/19/2020  Glucose 70 - 99 mg/dL 134(H) 119(H) 179(H)  BUN 8 - 23 mg/dL 131(H) 112(H) 174(H)  Creatinine 0.61 - 1.24 mg/dL 5.19(H) 4.43(H) 5.87(H)  Sodium 135 - 145 mmol/L 149(H) 144 148(H)  Potassium 3.5 -  5.1 mmol/L 4.3 4.4 5.3(H)  Chloride 98 - 111 mmol/L 110 106 110  CO2 22 - 32 mmol/L 21(L) 22 23  Calcium 8.9 - 10.3 mg/dL 8.9 8.5(L) 8.5(L)       Objective   Blood pressure (!) 191/98, pulse 95, temperature 97.7 F (36.5 C), temperature source Axillary, resp. rate (!) 24, height 5' 5.98" (1.676 m), weight 85.2 kg, SpO2 (!) 88 %.        Intake/Output Summary (Last 24 hours) at 06/21/2020 1040 Last data filed at 06/21/2020 0200 Gross per 24 hour  Intake --  Output  1125 ml  Net -1125 ml    Filed Weights   06/19/20 0500 06/20/20 0500 06/21/20 0530  Weight: 98.1 kg 98.1 kg 85.2 kg   Scheduled Meds:  amLODipine  5 mg Oral Daily   chlorhexidine gluconate (MEDLINE KIT)  15 mL Mouth Rinse BID   Chlorhexidine Gluconate Cloth  6 each Topical Daily   cloNIDine  0.2 mg Transdermal Weekly   furosemide  40 mg Intravenous Daily   heparin injection (subcutaneous)  5,000 Units Subcutaneous Q8H   insulin aspart  0-20 Units Subcutaneous Q4H   insulin glargine  20 Units Subcutaneous Daily   multivitamin  1 tablet Oral QHS   pantoprazole (PROTONIX) IV  40 mg Intravenous Q24H   prazosin  4 mg Oral QHS   sodium bicarbonate  1,300 mg Oral BID   sodium chloride flush  10-40 mL Intracatheter Q12H   traZODone  150 mg Oral QHS   Continuous Infusions:  sodium chloride Stopped (06/19/20 0717)   PRN Meds:.sodium chloride, hydrALAZINE, ondansetron **OR** ondansetron (ZOFRAN) IV, oxyCODONE, prazosin, sodium chloride flush    REVIEW OF SYSTEMS  PATIENT IS UNABLE TO PROVIDE COMPLETE REVIEW OF SYSTEMS DUE TO SEVERE CRITICAL ILLNESS AND TOXIC METABOLIC ENCEPHALOPATHY on MV   PHYSICAL EXAMINATION:  GENERAL: no apparent distress, older then stated age.  HEAD: Normocephalic, atraumatic. EYES: Pupils equal, round, reactive to light.  No scleral icterus. MOUTH: ETT, OG in place NECK: Supple. Trachea midline dialysis catheter on left neck wrapped without exudation or oozing of blood with clean dressing PULMONARY: rhonchi at left lung CARDIOVASCULAR: S1 and S2. Regular rate and rhythm. No murmurs, rubs, or gallops. GASTROINTESTINAL: Soft, non-distended. Positive bowel sounds. MUSCULOSKELETAL: No swelling, clubbing, or edema. NEUROLOGIC: internally preoccupied  SKIN:intact,warm,dry  ASSESSMENT AND PLAN  ACUTE Hypoxic and Hypercapnic Respiratory Failure -continue Mechanical Ventilator support -continue Bronchodilator Therapy -Wean Fio2 and PEEP as  tolerated -Continue VAP/VENT bundle -will perform SAT/SBT when respiratory parameters are met -too sedated for SBT -weaning sedatives  -plan to extubate today    SEVERE COPD EXACERBATION -continue IV steroids -continue NEB THERAPY     ACUTE KIDNEY INJURY/Renal Failure -continue Foley Catheter-assess need -Avoid nephrotoxic agents -Follow urine output, BMP -Ensure adequate renal perfusion, optimize oxygenation -Renal dose medications - HD today per Dr. Holley Raring     Acute toxic metabolic encephalopathy Goal RASS -0 Precedex     SEPTIC SHOCK -resolved -use vasopressors to keep MAP>65 as needed -follow ABG and LA -follow up cultures -continueABX   INFECTIOUS DISEASE -continue antibiotics as prescribed -follow up cultures -follow up ID recs   MRSA NEG RVP NEGATIVE COVID NEG INF AB NEG SPUTUM CX NG AFB SMEAR NEG HIV NEG   ENDO - ICU hypoglycemic\Hyperglycemia protocol -check FSBS per protocol     GI GI PROPHYLAXIS as indicated   NUTRITIONAL STATUS DIET-->TF's as tolerated Constipation protocol as indicated     ELECTROLYTES -follow labs as needed -  replace as needed -pharmacy consultation and following   MAR reviewed with clinical pharmacist     Best practice (right click and "Reselect all SmartList Selections" daily)  Diet:  NPO Pain/Anxiety/Delirium protocol (if indicated): Yes (RASS goal -2) VAP protocol (if indicated): Yes DVT prophylaxis: Subcutaneous Heparin GI prophylaxis: H2B Glucose control:  SSI Yes Central venous access:  N/A Arterial line:  N/A Foley:  N/A Mobility:  bed rest  PT consulted: N/A Code Status:  full code Disposition: ICU   Labs   CBC: Recent Labs  Lab 06/17/20 0537 06/18/20 0408 06/19/20 0448 06/20/20 0417 06/21/20 0606  WBC 7.3 8.9 7.2 10.7* 8.5  NEUTROABS 5.7 7.2 5.9 9.0* 7.1  HGB 8.2* 9.0* 8.1* 8.9* 9.6*  HCT 24.7* 26.1* 23.5* 27.2* 28.9*  MCV 87.3 85.6 86.7 89.2 88.7  PLT 369 391 309 323 322      Basic Metabolic Panel: Recent Labs  Lab 06/17/20 0537 06/18/20 0408 06/19/20 0448 06/20/20 0417 06/21/20 0606  NA 146* 143 148* 144 149*  K 5.4* 4.9 5.3* 4.4 4.3  CL 110 108 110 106 110  CO2 22 23 23 22  21*  GLUCOSE 172* 135* 179* 119* 134*  BUN 185* 152* 174* 112* 131*  CREATININE 6.06* 5.14* 5.87* 4.43* 5.19*  CALCIUM 8.5* 8.5* 8.5* 8.5* 8.9  MG 2.9* 2.7* 2.7* 2.5* 2.8*  PHOS 12.1* 11.1* 12.8* 10.6* 11.9*    GFR: Estimated Creatinine Clearance: 15.3 mL/min (A) (by C-G formula based on SCr of 5.19 mg/dL (H)). Recent Labs  Lab 06/18/20 0408 06/19/20 0448 06/20/20 0417 06/21/20 0606  WBC 8.9 7.2 10.7* 8.5     Liver Function Tests: Recent Labs  Lab 06/16/20 0509  ALBUMIN 1.9*    Recent Labs  Lab 06/20/20 1516  LIPASE 40  AMYLASE 61   No results for input(s): AMMONIA in the last 168 hours.  ABG    Component Value Date/Time   PHART 7.33 (L) 06/08/2020 0500   PCO2ART 40 06/08/2020 0500   PO2ART 99 06/08/2020 0500   HCO3 21.1 06/08/2020 0500   ACIDBASEDEF 4.5 (H) 06/08/2020 0500   O2SAT 97.2 06/08/2020 0500      Coagulation Profile: No results for input(s): INR, PROTIME in the last 168 hours.  Cardiac Enzymes: No results for input(s): CKTOTAL, CKMB, CKMBINDEX, TROPONINI in the last 168 hours.   HbA1C: Hgb A1c MFr Bld  Date/Time Value Ref Range Status  06/04/2020 09:48 PM 7.4 (H) 4.8 - 5.6 % Final    Comment:    (NOTE)         Prediabetes: 5.7 - 6.4         Diabetes: >6.4         Glycemic control for adults with diabetes: <7.0     CBG: Recent Labs  Lab 06/20/20 1627 06/20/20 1938 06/21/20 0009 06/21/20 0354 06/21/20 0725  GLUCAP 162* 111* 123* 111* 162*     Allergies No Known Allergies     The patient is critically ill with multiple organ systems failure and requires high complexity decision making for assessment and support, frequent evaluation and titration of therapies, application of advanced monitoring technologies  and extensive interpretation of multiple databases. Critical Care Time devoted to patient care services described in this note is 35 minutes.   Ottie Glazier, M.D.  [facility] [place]    *This note was dictated using voice recognition software/Dragon.  Despite best efforts to proofread, errors can occur which can change the  meaning.  Any change was purely unintentional.

## 2020-06-21 NOTE — Progress Notes (Signed)
Pt's brother called to the unit for an update and was given a general brief of status,. No concerns presented to this RN.

## 2020-06-21 NOTE — Progress Notes (Signed)
[facility]  Pt ran full tx, uneventful. Catheter good pressure and flows at BFR of 300. 1 L removed per order. Pt hypertensive at timed during tx.  Wyatt Ball

## 2020-06-22 ENCOUNTER — Inpatient Hospital Stay: Payer: No Typology Code available for payment source

## 2020-06-22 LAB — GLUCOSE, CAPILLARY
Glucose-Capillary: 167 mg/dL — ABNORMAL HIGH (ref 70–99)
Glucose-Capillary: 174 mg/dL — ABNORMAL HIGH (ref 70–99)
Glucose-Capillary: 197 mg/dL — ABNORMAL HIGH (ref 70–99)
Glucose-Capillary: 199 mg/dL — ABNORMAL HIGH (ref 70–99)
Glucose-Capillary: 206 mg/dL — ABNORMAL HIGH (ref 70–99)
Glucose-Capillary: 199 mg/dL — ABNORMAL HIGH (ref 70–99)

## 2020-06-22 LAB — CBC WITH DIFFERENTIAL/PLATELET
Abs Immature Granulocytes: 0.02 10*3/uL (ref 0.00–0.07)
Basophils Absolute: 0.1 10*3/uL (ref 0.0–0.1)
Basophils Relative: 1 %
Eosinophils Absolute: 0 10*3/uL (ref 0.0–0.5)
Eosinophils Relative: 0 %
HCT: 26.6 % — ABNORMAL LOW (ref 39.0–52.0)
Hemoglobin: 9.1 g/dL — ABNORMAL LOW (ref 13.0–17.0)
Immature Granulocytes: 0 %
Lymphocytes Relative: 13 %
Lymphs Abs: 0.9 10*3/uL (ref 0.7–4.0)
MCH: 30.2 pg (ref 26.0–34.0)
MCHC: 34.2 g/dL (ref 30.0–36.0)
MCV: 88.4 fL (ref 80.0–100.0)
Monocytes Absolute: 0.5 10*3/uL (ref 0.1–1.0)
Monocytes Relative: 8 %
Neutro Abs: 5.4 10*3/uL (ref 1.7–7.7)
Neutrophils Relative %: 78 %
Platelets: 254 10*3/uL (ref 150–400)
RBC: 3.01 MIL/uL — ABNORMAL LOW (ref 4.22–5.81)
RDW: 14.8 % (ref 11.5–15.5)
WBC: 7 10*3/uL (ref 4.0–10.5)
nRBC: 0.3 % — ABNORMAL HIGH (ref 0.0–0.2)

## 2020-06-22 LAB — COMPREHENSIVE METABOLIC PANEL
ALT: 38 U/L (ref 0–44)
AST: 35 U/L (ref 15–41)
Albumin: 2.6 g/dL — ABNORMAL LOW (ref 3.5–5.0)
Alkaline Phosphatase: 66 U/L (ref 38–126)
Anion gap: 10 (ref 5–15)
BUN: 91 mg/dL — ABNORMAL HIGH (ref 8–23)
CO2: 27 mmol/L (ref 22–32)
Calcium: 8.7 mg/dL — ABNORMAL LOW (ref 8.9–10.3)
Chloride: 105 mmol/L (ref 98–111)
Creatinine, Ser: 3.7 mg/dL — ABNORMAL HIGH (ref 0.61–1.24)
GFR, Estimated: 18 mL/min — ABNORMAL LOW (ref >60)
Glucose, Bld: 170 mg/dL — ABNORMAL HIGH (ref 70–99)
Potassium: 3.7 mmol/L (ref 3.5–5.1)
Sodium: 142 mmol/L (ref 135–145)
Total Bilirubin: 0.8 mg/dL (ref 0.3–1.2)
Total Protein: 6.2 g/dL — ABNORMAL LOW (ref 6.5–8.1)

## 2020-06-22 LAB — PHOSPHORUS: Phosphorus: 9.3 mg/dL — ABNORMAL HIGH (ref 2.5–4.6)

## 2020-06-22 LAB — MAGNESIUM: Magnesium: 2.2 mg/dL (ref 1.7–2.4)

## 2020-06-22 IMAGING — CT CT HEAD W/O CM
4 series · 16 of 47 positions shown, 18 images · non-contrast
Comparison: MR head [DATE]. CT head without contrast [DATE]

CLINICAL DATA: Stroke, follow-up. Slow recovery of metabolic
encephalopathy.

EXAM:
CT HEAD WITHOUT CONTRAST
TECHNIQUE: Contiguous axial images were obtained from the base of the skull
through the vertex without intravenous contrast.

[Series 2: head bone · axial · 0.45mm/px · z∈[-99,-65]mm · 3 of 87 slices shown]
[im 9/87  bone]
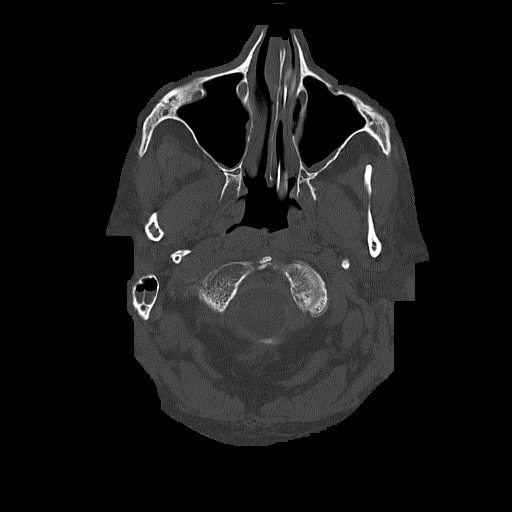
[im 18/87  bone]
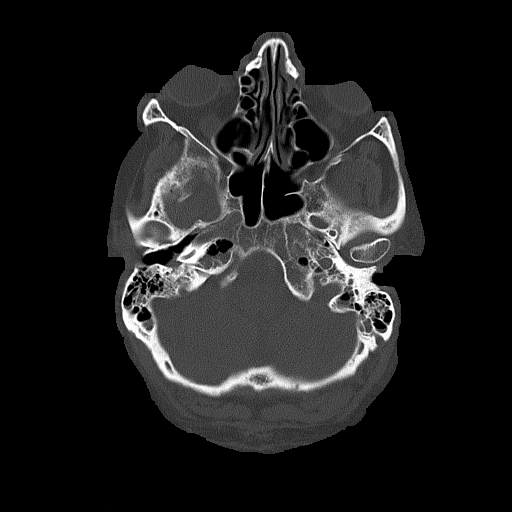
[im 26/87  bone]
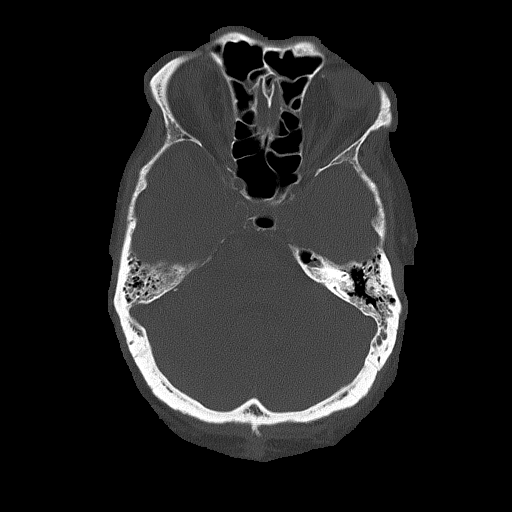

[Series 3: head wo · axial · 0.45mm/px · z∈[-95,+30]mm · 7 of 35 slices shown, 9 images]
[im 5/35  brain]
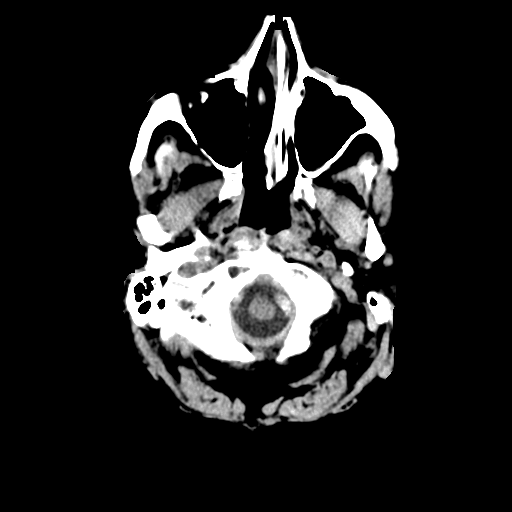
[im 5/35  bone]
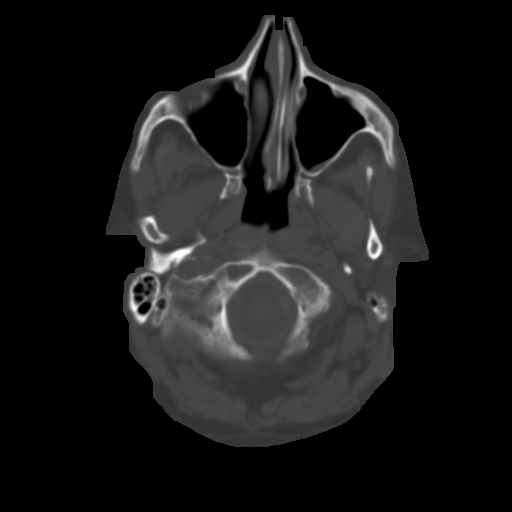
[im 9/35  brain]
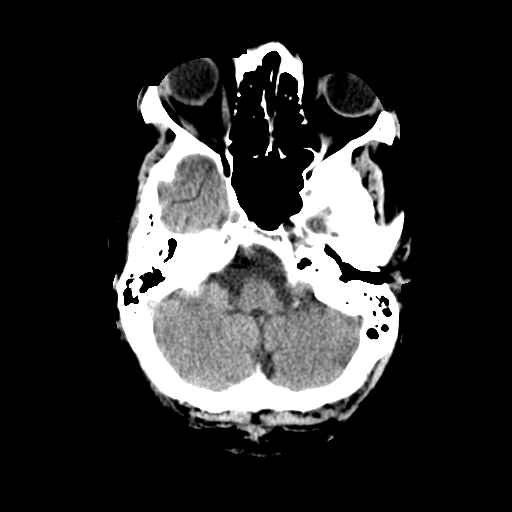
[im 13/35  brain]
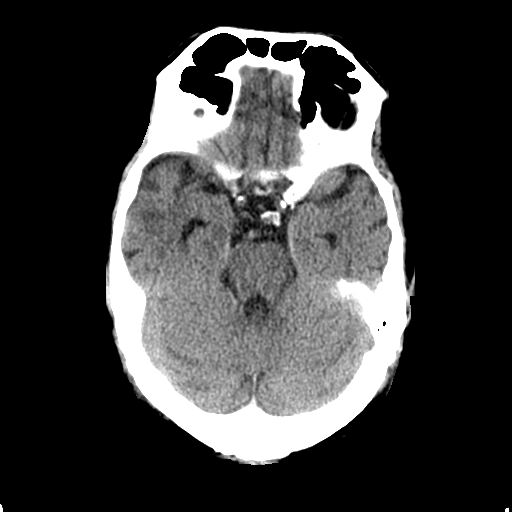
[im 18/35  brain]
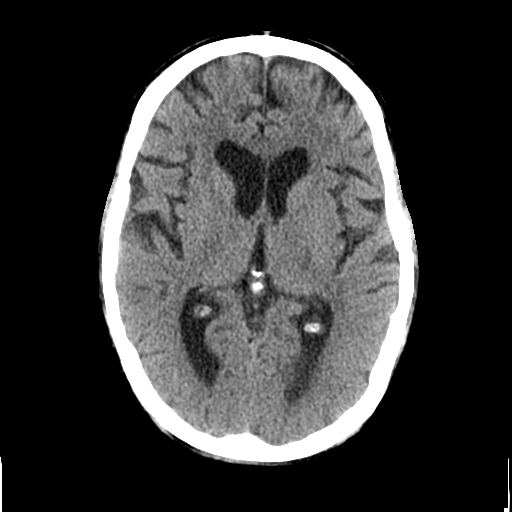
[im 22/35  brain]
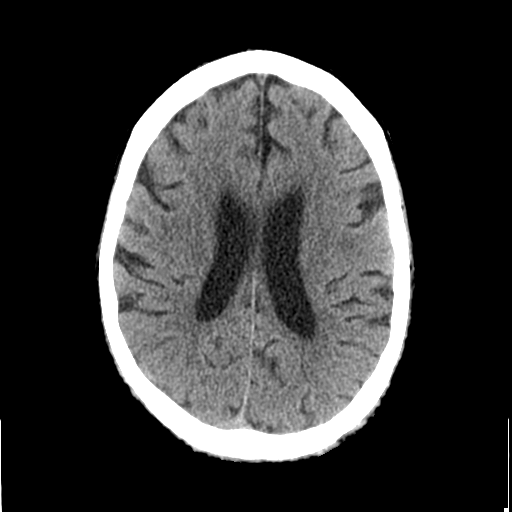
[im 22/35  bone]
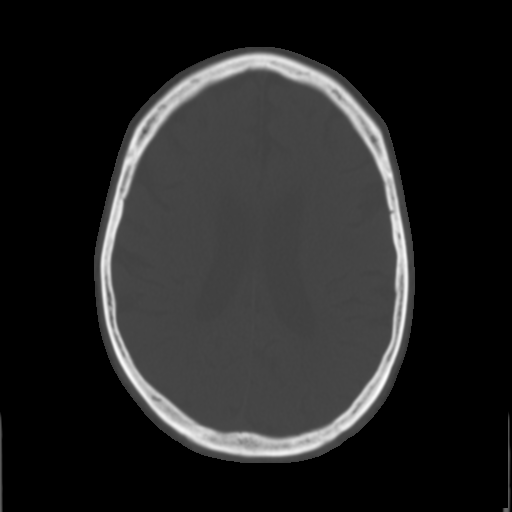
[im 26/35  brain]
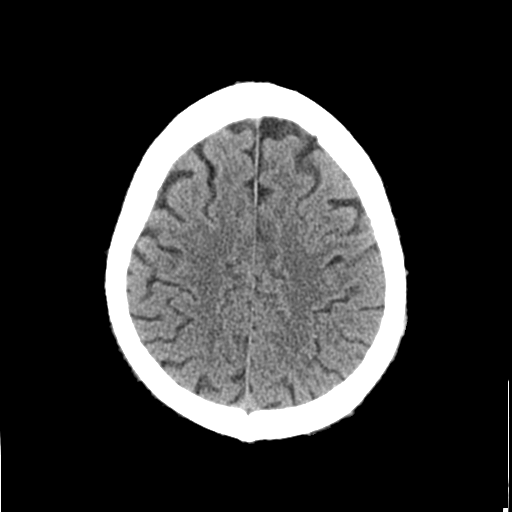
[im 30/35  brain]
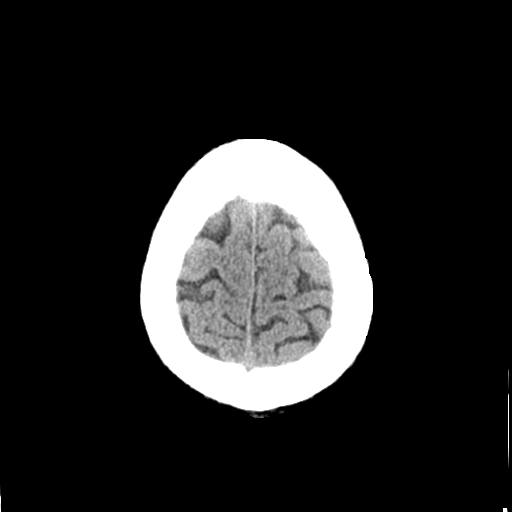

[Series 4: coronal soft tissue · coronal · 0.35mm/px · 3 of 74 slices shown]
[im 25/74  brain]
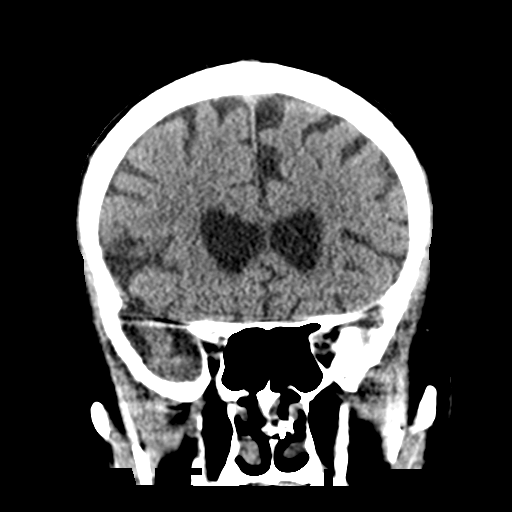
[im 33/74  brain]
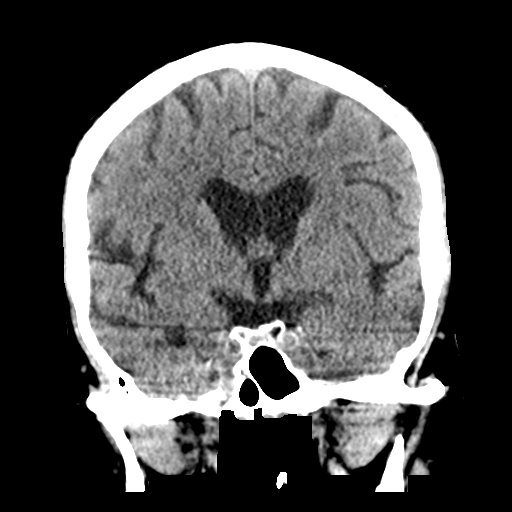
[im 41/74  brain]
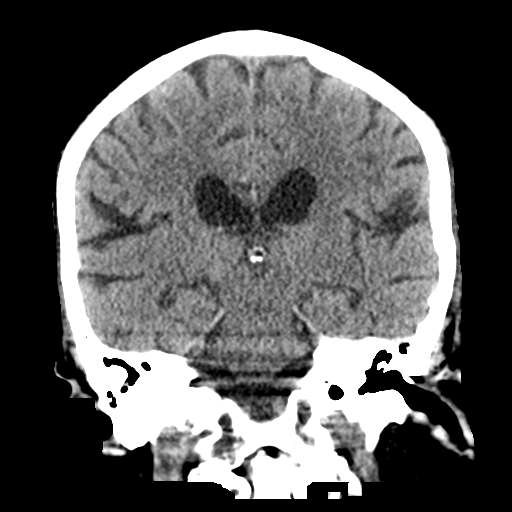

[Series 5: sagittal soft tissue · sagittal · 0.35mm/px · 3 of 57 slices shown]
[im 19/57  brain]
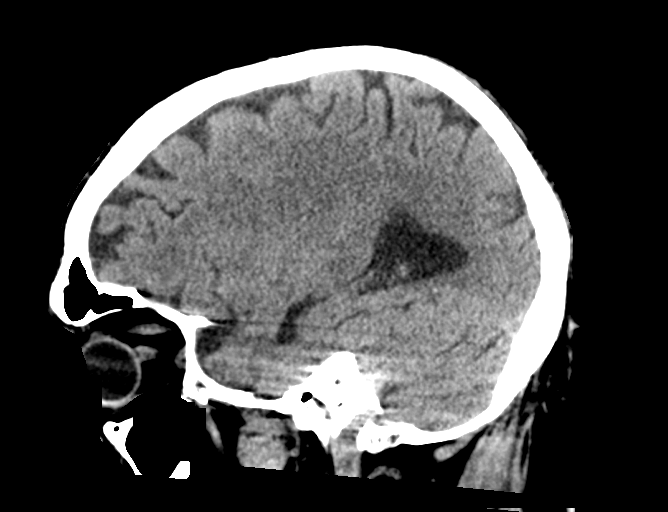
[im 29/57  brain]
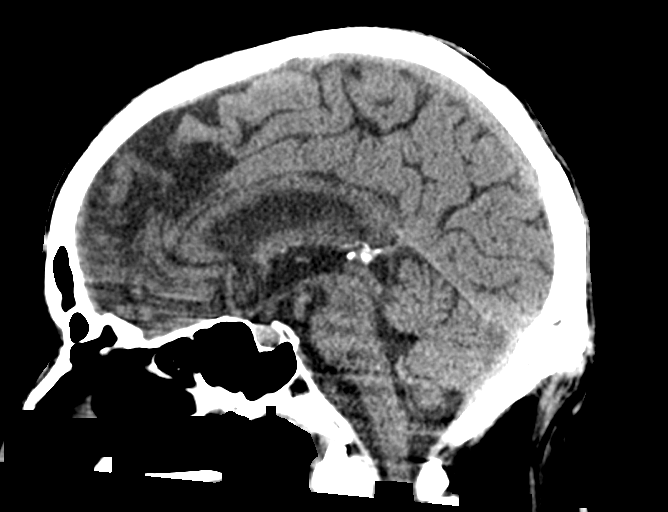
[im 38/57  brain]
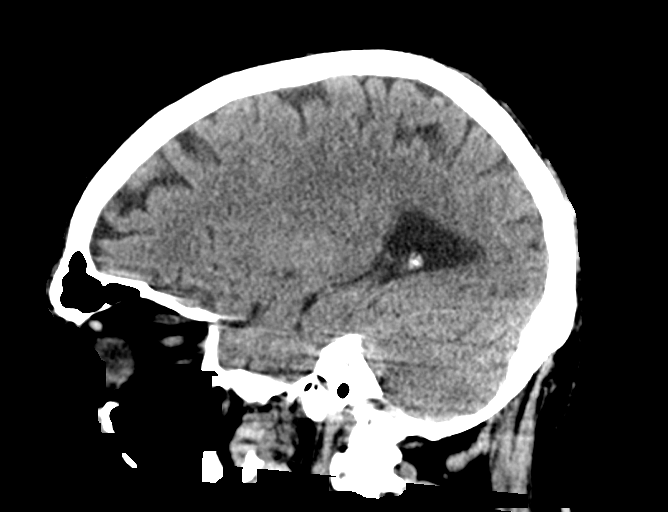

[16 of 47 positions shown; findings below may reference images not displayed]

FINDINGS: Brain: Mild atrophy and white matter changes are stable. No acute
infarct, hemorrhage, or mass lesion is present. Basal ganglia are
intact. Insular ribbon is normal bilaterally. The brainstem and
cerebellum are within normal limits.

Vascular: No hyperdense vessel or unexpected calcification.
Calvarium is intact. No focal lytic or blastic lesions are present.
No significant extracranial soft tissue lesion is present.

Skull: Insert normal skull No significant extracranial soft tissue
lesion is present.

Sinuses/Orbits: Minimal fluid is present in the posterior mastoid
air cells bilaterally. No obstructing nasopharyngeal lesion is
present. NG tube is in place. Minimal mucosal thickening is present
in the inferior maxillary sinuses bilaterally. The paranasal sinuses
and mastoid air cells are otherwise clear. The globes and orbits are
within normal limits.
IMPRESSION: 1. Stable atrophy and white matter disease.
2. No acute intracranial abnormality or significant interval change.

## 2020-06-22 MED ORDER — HYDRALAZINE HCL 20 MG/ML IJ SOLN
10.0000 mg | INTRAMUSCULAR | Status: DC | PRN
  Administered 2020-06-23: 20 mg via INTRAVENOUS
  Administered 2020-06-24: 10 mg via INTRAVENOUS
  Administered 2020-06-25 – 2020-06-27 (×2): 20 mg via INTRAVENOUS
  Administered 2020-07-01: 10 mg via INTRAVENOUS
  Filled 2020-06-22 (×6): qty 1

## 2020-06-22 MED ORDER — INSULIN ASPART 100 UNIT/ML IJ SOLN
3.0000 [IU] | INTRAMUSCULAR | Status: DC
  Administered 2020-06-22 – 2020-07-23 (×165): 3 [IU] via SUBCUTANEOUS
  Filled 2020-06-22 (×158): qty 1

## 2020-06-22 MED ORDER — NEPRO/CARBSTEADY PO LIQD
1000.0000 mL | ORAL | Status: DC
  Administered 2020-06-22 – 2020-07-04 (×9): 1000 mL

## 2020-06-22 NOTE — Progress Notes (Signed)
Neurology Progress Note    HPI: Wyatt Ball. is a 60 y.o. male with a past medical history significant for hypertension, diabetes, anxiety/PTSD, remote transverse myelitis C/B neurogenic bladder requiring self-catheterization, chronic pain on chronic opiates.  He was initially admitted with sepsis and left lung pneumonia on 5/31 but had progressive confusion and hypoxia for which she required intubation on 6/2.  Bronc revealed Staphylococcus species and possible Candida.  He self extubated on 6/3 but was emergently reintubated with gradual improvement, extubated 6/15.  Due to renal failure (felt to be secondary to diabetic nephropathy, sepsis, acute tubular necrosis, IV contrast) he additionally required dialysis, and he has been receiving IV steroids for concern for severe COPD exacerbation  He was initially treated with azithromycin, cefepime, metronidazole, vancomycin (5/31), followed by azithromycin and ceftriaxone (6/26/2), followed by azithromycin and metronidazole (6/3-6/7), with the addition doxycycline for possibility of tick borne illness 6/6-6/13, levofloxacin 6/8-6/16.  He has been afebrile since 6/15, with very gradually improving mentation still far off baseline.   Nursing reported some concern for Ethanol use at home, but patient's brother denies this.  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  He reports they will have a beer or 2 together once or twice a month but denies any heavy alcohol use.  He notes that the transverse myelitis was treated at [facility] approximately 20 years ago and he is unable to access any of these records.  He does note that the patient has had worse pain on the left side since then but is not sure if the weakness was more pronounced on 1 side or another during the initial event.  Patient's brother reports that the  patient is nearly back to the mental status he was just before he came into the hospital, and certainly improved from his nadir.   Exam: Current vital signs: BP (!) 168/73   Pulse 94   Temp 98.4 F (36.9 C) (Oral)   Resp 12   Ht 5' 5.98" (1.676 m)   Wt 85.2 kg   SpO2 97%   BMI 30.33 kg/m  Vital signs in last 24 hours: Temp:  [98.3 F (36.8 C)-99 F (37.2 C)] 98.4 F (36.9 C) (06/18 1400) Pulse Rate:  [86-110] 94 (06/18 1600) Resp:  [3-19] 12 (06/18 1600) BP: (124-175)/(71-102) 168/73 (06/18 1600) SpO2:  [93 %-97 %] 97 % (06/18 1600)   Physical Exam  Constitutional: Appears well-developed and well-nourished.  Psych: Minimally interactive  Eyes: No scleral injection HENT: NGT in place, poor dentition MSK: no joint deformities.  Cardiovascular: Normal rate and regular rhythm.  Respiratory: Effort normal, non-labored breathing, intermittent cough GI: Soft.  No distension. There is no tenderness.  Skin: Warm dry and intact visible skin  Neuro: Mental Status: Alert and tracks examiner, orients to salient stimuli (TV, tech), with apparently accurate saccades. States "Help" and at times, "Help me" mostly grunts/groans, at one point states "What?" In response to a question. Does open mouth and say ah (mimic examiner). At times squeezes hand but not clearly to command. Wiggles right foot possibly to command  Cranial Nerves: II: Visual Fields are full to blink to threat. Pupils are equal, round, and reactive to light.  III,IV, VI: EOMI without ptosis or diploplia.  V: Facial sensation is symmetric to eyelash brush VII: Facial movement is symmetric to grimace.  VIII: hearing  is intact to voice X: Uvula elevates symmetrically XII: tongue is midline (just slightly protrudes at one time in response to command) Sensory/motor: Tone is relatively increased in the left upper and lower extremity compared to the right side, the evaluation is somewhat complicated by peritoneal he does  appear to be more spastic on the left.  Additionally he does spontaneously move all 4 extremities (spontaneous movement is greater than movement to noxious stimulation), but this movement does appear to be greater distally than proximally.  Initially he nearly hits his face with his left upper extremity when it is released above his face, but as he is more awake after noxious stimulation he avoids his face with both left and right upper extremities.  Estimate 2/5 strength in the deltoids and 3/5 distally in the upper extremities, similarly 2/5 estimated in the bilateral hips and 3/5 distally Deep Tendon Reflexes: 1+ in the right biceps, 3+ in the left biceps, 3+ and symmetric in the patellae Plantars: Toes are downgoing bilaterally.  Cerebellar: Unable to assess secondary to patient's mental status   I have reviewed labs in epic and the results pertinent to this consultation are:   Lab Results  Component Value Date   TSH 3.057 06/14/2020     Lab Results  Component Value Date   [id number] 063 06/21/2020    HIV neg 06/04/20 and 06/08/20 Crypto antigen negative 06/10/20 PLA2R antibody negative 0/1/60 Ehrlichia negative 1/0/93  Lab Results  Component Value Date   HGBA1C 7.4 (H) 06/04/2020    Lab Results  Component Value Date   TRIG 840 (H) 06/10/2020    Ref. Range 06/05/2020 07:29  Cortisol - AM Latest Ref Range: 6.7 - 22.6 ug/dL 67.8 (H)   Results for Wyatt Ball (MRN [id number]) as of 06/22/2020 17:02  Ref. Range 06/09/2020 09:01  Anti Nuclear Antibody (ANA) Latest Ref Range: Negative  Negative  ANCA Proteinase 3 Latest Ref Range: 0.0 - 3.5 U/mL <3.5  ASO Latest Ref Range: 0.0 - 200.0 IU/mL 34.0  GBM Ab Latest Ref Range: 0 - 20 units 3  Myeloperoxidase Abs Latest Ref Range: 0.0 - 9.0 U/mL <9.0  Cytoplasmic (C-ANCA) Latest Ref Range: Neg:<1:20 titer <1:20  P-ANCA Latest Ref Range: Neg:<1:20 titer <1:20  Atypical P-ANCA titer Latest Ref Range: Neg:<1:20 titer <1:20  C3  Complement Latest Ref Range: 82 - 167 mg/dL 143  Complement C4, Body Fluid Latest Ref Range: 12 - 38 mg/dL 44 (H)  Kappa free light chain Latest Ref Range: 3.3 - 19.4 mg/L 116.6 (H)  Lamda free light chains Latest Ref Range: 5.7 - 26.3 mg/L 99.7 (H)  Kappa, lamda light chain ratio Latest Ref Range: 0.26 - 1.65  1.17    I have reviewed the images obtained:  06/07/2020 MRI brain with and without contrast personally reviewed.  Agree with radiology, no evidence of acute intracranial process though several of the postcontrast sequences in particular are motion limited  Assessment: Examination and time course of symptoms with gradual improvement as appear to be most consistent with a toxic/metabolic encephalopathy that is slow to clear perhaps due to his renal function and severity of his infection which now appears to be adequately treated. Proximal muscle weakness may be secondary to critical illness myopathy/neuropathy.    Regarding his history of transverse myelitis, do not suspect that this is contributory as this is typically a monophasic disorder.  Suspect given the patient's pain is worse on the left side that his transverse myelitis did affected the left hemicord  more than the right, explaining his spasticity on the left relative to the right.  Additionally, very loath to consider intubation for MRI of the neuraxis given the difficulty in liberating him from the ventilator earlier this hospitalization and overall his slow and gradual improvement.  Given my primary index of suspicion is toxic/metabolic encephalopathy, sedation for scan could significantly impair his recovery and therefore at this time I do not feel the risks of reimaging are outweighed by the benefits.  Scan without sedation is unlikely to be diagnostic given his frequent spontaneous movements and inability to reliably follow commands.  Finally, his MRI brain with and without contrast completed earlier this hospitalization was  reassuring and again it is reassuring that he is recovering, albeit slowly.  Impression: Toxic/metabolic encephalopathy with slow recovery Gradual improvement labs and fever curve suggest no ongoing infection Left-sided spasticity, suspect baseline from prior episode of transverse myelitis Proximal muscle weakness, likely related to acute illness  Recommendations: -Routine EEG to be completed 6/20 -Appreciate PT/OT engagement -Neurology will continue to follow  [place] [phone] [facility] coverage for [facility] is from 8 AM to 4 AM in-house and 4 PM to 8 PM by telephone/video. 8 PM to 8 AM emergent questions or overnight urgent questions should be addressed to Teleneurology On-call or [facility] neurohospitalist; contact information can be found on AMION Greater than 35 minutes were spent in direct care of this patient today, of which over 50% was at bedside examining the patient ordered directly discussing with family as documented above

## 2020-06-22 NOTE — Progress Notes (Signed)
NAME:  Wyatt Tippin., MRN:  [id number], DOB:  [birth year], LOS: 10 ADMISSION DATE:  06/04/2020 60 y.o. male with medical history significant for HTN, PTSD, diabetes, migraines, GERD, neurogenic bladder secondary to remote history of transverse myelitis, who self catheterizes and has frequent UTIs as well as chronic back pain on chronic opiates who at baseline is independent - brought to the ER with a 3-day history of altered mental status.   + cough, shortness of breath and fever as well as vomiting and diarrhea and started becoming increasingly confused in the 24 hours prior to arrival.       Unable to get any meaningful history from patient.   ED course: On arrival, temperature 99.3, BP 167/67, pulse 99, respirations 18 with O2 sat 96% on room air.  Blood work significant for leukocytosis of 13,600 with lactic acid of 1.2.  Ammonia level normal at 15, EtOH less than 10.  Sodium 130, potassium 3.2, glucose 249 creatinine 1.06.  Urinalysis with 80 ketones and rare bacteria.  UDS positive for opiates and tricyclic's.  COVID and flu negative       Imaging:   CT chest abdomen and pelvis without contrast consistent with pneumonia.  Multiple nodules left upper lobe likely infectious or inflammatory with follow-up imaging recommended to document resolution.   Cardiomegaly with trace pericardial effusion   Patient was started on sepsis fluids and broad-spectrum antibiotics initially for sepsis of unknown source.       Significant Hospital Events: Including procedures, antibiotic start and stop dates in addition to other pertinent events   5/31 admitted for sepsis present on admission for Left lung pneumonia 6/1 progressive confusion and hypoxia 6/2 transferred to ICU, INTUBATED, S/P BRONCH 6/3 high fevers, remains ill, SELF EXTUBATED 6/3 emergently re-intubated 6/5 failure to wean from v ent 6/6 failure to wean from vent, VASC CATH PLACED 6/7 HD started remains critically ill 6/8  remains on vent 6/9 waiting for family for SAT 06/17/2020-patient had dialysis today without acute events.  He was unable to pass SBT even post HD due to Care plan reviewed with infectious disease today.  06/18/20- patient still unable to follow verbal communication.  Repeat SBT today with goal for extubation   06/19/20-  patient is improved, will attempt to liberate from MV today post dialysis and SBT.  06/20/20- patient is improved, he is difficult to engage in conversation, I was able to capture his attention with negotiating food items and he did speak with me some. Unclear if there is psychiatric disease. 06/21/20- patient on room air now, he is non-communicative with encephalopathy, plan for additional dialysis and official swallow evaluation.  06/22/20- patient is improved today with ability to move hands sometimes independently.  His metabolic encephalopathy is very slow to improve, will obtain neurology evaluation.     CBC    Component Value Date/Time   WBC 7.0 06/22/2020 0542   RBC 3.01 (L) 06/22/2020 0542   HGB 9.1 (L) 06/22/2020 0542   HCT 26.6 (L) 06/22/2020 0542   PLT 254 06/22/2020 0542   MCV 88.4 06/22/2020 0542   MCH 30.2 06/22/2020 0542   MCHC 34.2 06/22/2020 0542   RDW 14.8 06/22/2020 0542   LYMPHSABS 0.9 06/22/2020 0542   MONOABS 0.5 06/22/2020 0542   EOSABS 0.0 06/22/2020 0542   BASOSABS 0.1 06/22/2020 0542     BMP Latest Ref Rng & Units 06/22/2020 06/21/2020 06/20/2020  Glucose 70 - 99 mg/dL 170(H) 134(H) 119(H)  BUN 8 - 23 mg/dL  91(H) 131(H) 112(H)  Creatinine 0.61 - 1.24 mg/dL 3.70(H) 5.19(H) 4.43(H)  Sodium 135 - 145 mmol/L 142 149(H) 144  Potassium 3.5 - 5.1 mmol/L 3.7 4.3 4.4  Chloride 98 - 111 mmol/L 105 110 106  CO2 22 - 32 mmol/L 27 21(L) 22  Calcium 8.9 - 10.3 mg/dL 8.7(L) 8.9 8.5(L)       Objective   Blood pressure (!) 149/82, pulse (!) 104, temperature 98.8 F (37.1 C), temperature source Oral, resp. rate (!) 3, height 5' 5.98" (1.676 m), weight  85.2 kg, SpO2 95 %.        Intake/Output Summary (Last 24 hours) at 06/22/2020 1155 Last data filed at 06/22/2020 0800 Gross per 24 hour  Intake 1923.57 ml  Output 2705 ml  Net -781.43 ml    Filed Weights   06/19/20 0500 06/20/20 0500 06/21/20 0530  Weight: 98.1 kg 98.1 kg 85.2 kg   Scheduled Meds:  amLODipine  5 mg Per Tube Daily   chlorhexidine gluconate (MEDLINE KIT)  15 mL Mouth Rinse BID   Chlorhexidine Gluconate Cloth  6 each Topical Daily   cloNIDine  0.3 mg Transdermal Weekly   feeding supplement (NEPRO CARB STEADY)  1,000 mL Per Tube Q24H   feeding supplement (PROSource TF)  45 mL Per Tube Daily   free water  30 mL Per Tube Q4H   heparin injection (subcutaneous)  5,000 Units Subcutaneous Q8H   insulin aspart  0-20 Units Subcutaneous Q4H   insulin glargine  20 Units Subcutaneous Daily   multivitamin  1 tablet Per Tube QHS   nutrition supplement (JUVEN)  1 packet Per Tube BID   pantoprazole (PROTONIX) IV  40 mg Intravenous Q24H   prazosin  4 mg Per Tube QHS   sodium chloride flush  10-40 mL Intracatheter Q12H   Continuous Infusions:  sodium chloride 10 mL/hr at 06/22/20 0800   PRN Meds:.sodium chloride, heparin sodium (porcine), heparin sodium (porcine), hydrALAZINE, labetalol, ondansetron **OR** ondansetron (ZOFRAN) IV, prazosin, sodium chloride flush    REVIEW OF SYSTEMS  PATIENT IS UNABLE TO PROVIDE COMPLETE REVIEW OF SYSTEMS DUE TO SEVERE CRITICAL ILLNESS AND TOXIC METABOLIC ENCEPHALOPATHY on MV   PHYSICAL EXAMINATION:  GENERAL: no apparent distress, older then stated age.  HEAD: Normocephalic, atraumatic. EYES: Pupils equal, round, reactive to light.  No scleral icterus. MOUTH: Dry oral mucosa NECK: Supple. Trachea midline dialysis catheter on left neck wrapped without exudation or oozing of blood with clean dressing PULMONARY: rhonchi at left lung CARDIOVASCULAR: S1 and S2. Regular rate and rhythm. No murmurs, rubs, or gallops. GASTROINTESTINAL: Soft,  non-distended. Positive bowel sounds. MUSCULOSKELETAL: No swelling, clubbing, or edema. NEUROLOGIC: Nonverbal randomly moves extremities but not purposeful, sometimes does say words and appears to speak purposefully overall unclear mentation SKIN:intact,warm,dry  ASSESSMENT AND PLAN  ACUTE Hypoxic and Hypercapnic Respiratory Failure Due to pneumonia-present on admission due to staph species versus possible Candida as evidenced by positive tracheal aspirate and bronchoalveolar lavage   -Status postextubation with very slow neurologic recovery still unable to communicate purposefully      SEVERE COPD EXACERBATION -continue IV steroids -continue NEB THERAPY     ACUTE KIDNEY INJURY/Renal Failure -continue Foley Catheter-assess need -Avoid nephrotoxic agents -Follow urine output, BMP -Ensure adequate renal perfusion, optimize oxygenation -Renal dose medications - HD today per Dr. Holley Raring     Acute toxic metabolic encephalopathy Goal RASS -0 Precedex     SEPTIC SHOCK -resolved -use vasopressors to keep MAP>65 as needed -follow ABG and LA -follow up cultures -  continueABX   INFECTIOUS DISEASE -continue antibiotics as prescribed -follow up cultures -follow up ID recs   MRSA NEG RVP NEGATIVE COVID NEG INF AB NEG SPUTUM CX NG AFB SMEAR NEG HIV NEG   ENDO - ICU hypoglycemic\Hyperglycemia protocol -check FSBS per protocol     GI GI PROPHYLAXIS as indicated   NUTRITIONAL STATUS DIET-->TF's as tolerated Constipation protocol as indicated     ELECTROLYTES -follow labs as needed -replace as needed -pharmacy consultation and following   MAR reviewed with clinical pharmacist     Best practice (right click and "Reselect all SmartList Selections" daily)  Diet:  NPO Pain/Anxiety/Delirium protocol (if indicated): Yes (RASS goal -2) VAP protocol (if indicated): Yes DVT prophylaxis: Subcutaneous Heparin GI prophylaxis: H2B Glucose control:  SSI Yes Central  venous access:  N/A Arterial line:  N/A Foley:  N/A Mobility:  bed rest  PT consulted: N/A Code Status:  full code Disposition: ICU   Labs   CBC: Recent Labs  Lab 06/18/20 0408 06/19/20 0448 06/20/20 0417 06/21/20 0606 06/22/20 0542  WBC 8.9 7.2 10.7* 8.5 7.0  NEUTROABS 7.2 5.9 9.0* 7.1 5.4  HGB 9.0* 8.1* 8.9* 9.6* 9.1*  HCT 26.1* 23.5* 27.2* 28.9* 26.6*  MCV 85.6 86.7 89.2 88.7 88.4  PLT 391 309 323 322 254     Basic Metabolic Panel: Recent Labs  Lab 06/18/20 0408 06/19/20 0448 06/20/20 0417 06/21/20 0606 06/22/20 0542  NA 143 148* 144 149* 142  K 4.9 5.3* 4.4 4.3 3.7  CL 108 110 106 110 105  CO2 _0 21* 27  GLUCOSE 135* 179* 119* 134* 170*  BUN 152* 174* 112* 131* 91*  CREATININE 5.14* 5.87* 4.43* 5.19* 3.70*  CALCIUM 8.5* 8.5* 8.5* 8.9 8.7*  MG 2.7* 2.7* 2.5* 2.8* 2.2  PHOS 11.1* 12.8* 10.6* 11.9* 9.3*    GFR: Estimated Creatinine Clearance: 21.5 mL/min (A) (by C-G formula based on SCr of 3.7 mg/dL (H)). Recent Labs  Lab 06/19/20 0448 06/20/20 0417 06/21/20 0606 06/22/20 0542  WBC 7.2 10.7* 8.5 7.0     Liver Function Tests: Recent Labs  Lab 06/16/20 0509 06/22/20 0542  AST  --  35  ALT  --  38  ALKPHOS  --  66  BILITOT  --  0.8  PROT  --  6.2*  ALBUMIN 1.9* 2.6*    Recent Labs  Lab 06/20/20 1516  LIPASE 40  AMYLASE 61    No results for input(s): AMMONIA in the last 168 hours.  ABG    Component Value Date/Time   PHART 7.33 (L) 06/08/2020 0500   PCO2ART 40 06/08/2020 0500   PO2ART 99 06/08/2020 0500   HCO3 21.1 06/08/2020 0500   ACIDBASEDEF 4.5 (H) 06/08/2020 0500   O2SAT 97.2 06/08/2020 0500      Coagulation Profile: No results for input(s): INR, PROTIME in the last 168 hours.  Cardiac Enzymes: No results for input(s): CKTOTAL, CKMB, CKMBINDEX, TROPONINI in the last 168 hours.   HbA1C: Hgb A1c MFr Bld  Date/Time Value Ref Range Status  06/04/2020 09:48 PM 7.4 (H) 4.8 - 5.6 % Final    Comment:    (NOTE)          Prediabetes: 5.7 - 6.4         Diabetes: >6.4         Glycemic control for adults with diabetes: <7.0     CBG: Recent Labs  Lab 06/21/20 1939 06/21/20 2336 06/22/20 0353 06/22/20 0730 06/22/20 1117  GLUCAP 99  109* 167* 174* 197*     Allergies No Known Allergies     The patient is critically ill with multiple organ systems failure and requires high complexity decision making for assessment and support, frequent evaluation and titration of therapies, application of advanced monitoring technologies and extensive interpretation of multiple databases. Critical Care Time devoted to patient care services described in this note is 35 minutes.   Ottie Glazier, M.D.  P[facility] [place]    *This note was dictated using voice recognition software/Dragon.  Despite best efforts to proofread, errors can occur which can change the meaning.  Any change was purely unintentional.

## 2020-06-22 NOTE — Progress Notes (Signed)
[facility]  ROUNDING NOTE   Subjective:   No meaningful communication.  Hemodialysis treatment yesterday. Tolerated treatment well.  UF 1000 mL.   Objective:  Vital signs in last 24 hours:  Temp:  [97.4 F (36.3 C)-99 F (37.2 C)] 98.8 F (37.1 C) (06/18 0800) Pulse Rate:  [89-110] 104 (06/18 0800) Resp:  [3-21] 3 (06/18 0800) BP: (137-181)/(68-121) 149/82 (06/18 0800) SpO2:  [92 %-97 %] 95 % (06/18 0800)  Weight change:  Filed Weights   06/19/20 0500 06/20/20 0500 06/21/20 0530  Weight: 98.1 kg 98.1 kg 85.2 kg    Intake/Output: I/O last 3 completed shifts: In: 1906.1 [I.V.:153.9; NG/GT:1752.3] Out: 2951 [Urine:2330; Other:1000]   Intake/Output this shift:  Total I/O In: 27.5 [I.V.:27.5] Out: -   Physical Exam: General: NAD, laying in bed  Head: Normocephalic, atraumatic. Moist oral mucosal membranes  Eyes: Anicteric, PERRL  Neck: Supple, trachea midline  Lungs:  Clear to auscultation  Heart: Regular rate and rhythm  Abdomen:  Soft, nontender,   Extremities:  no peripheral edema.  Neurologic: Moving all four extremities. Not able to follow commands.   Skin: No lesions  Access: Left IJ temp HD catheter 6/6  GU Foley    Basic Metabolic Panel: Recent Labs  Lab 06/18/20 0408 06/19/20 0448 06/20/20 0417 06/21/20 0606 06/22/20 0542  NA 143 148* 144 149* 142  K 4.9 5.3* 4.4 4.3 3.7  CL 108 110 106 110 105  CO2 _0 21* 27  GLUCOSE 135* 179* 119* 134* 170*  BUN 152* 174* 112* 131* 91*  CREATININE 5.14* 5.87* 4.43* 5.19* 3.70*  CALCIUM 8.5* 8.5* 8.5* 8.9 8.7*  MG 2.7* 2.7* 2.5* 2.8* 2.2  PHOS 11.1* 12.8* 10.6* 11.9* 9.3*    Liver Function Tests: Recent Labs  Lab 06/16/20 0509 06/22/20 0542  AST  --  35  ALT  --  38  ALKPHOS  --  66  BILITOT  --  0.8  PROT  --  6.2*  ALBUMIN 1.9* 2.6*   Recent Labs  Lab 06/20/20 1516  LIPASE 40  AMYLASE 61   No results for input(s): AMMONIA in the last 168 hours.  CBC: Recent Labs   Lab 06/18/20 0408 06/19/20 0448 06/20/20 0417 06/21/20 0606 06/22/20 0542  WBC 8.9 7.2 10.7* 8.5 7.0  NEUTROABS 7.2 5.9 9.0* 7.1 5.4  HGB 9.0* 8.1* 8.9* 9.6* 9.1*  HCT 26.1* 23.5* 27.2* 28.9* 26.6*  MCV 85.6 86.7 89.2 88.7 88.4  PLT 391 309 323 322 254    Cardiac Enzymes: No results for input(s): CKTOTAL, CKMB, CKMBINDEX, TROPONINI in the last 168 hours.  BNP: Invalid input(s): POCBNP  CBG: Recent Labs  Lab 06/21/20 1447 06/21/20 1939 06/21/20 2336 06/22/20 0353 06/22/20 0730  GLUCAP 132* 99 109* 167* 174*    Microbiology: Results for orders placed or performed during the hospital encounter of 06/04/20  Blood culture (single)     Status: Abnormal   Collection Time: 06/04/20  5:18 PM   Specimen: BLOOD  Result Value Ref Range Status   Specimen Description   Final    BLOOD BLOOD RIGHT HAND Performed at [facility], [address], [place], [place] [address]    Special Requests   Final    BOTTLES DRAWN AEROBIC AND ANAEROBIC Blood Culture adequate volume Performed at [facility], [address], [place], [place] [address]    Culture  Setup Time   Final    GRAM POSITIVE COCCI IN BOTH AEROBIC AND ANAEROBIC BOTTLES  CRITICAL RESULT CALLED TO, READ BACK BY AND VERIFIED WITH: Elvera Maria [id number] 06/06/2020 DLB Performed at [facility], [address] [place], [place] [address]    Culture (A)  Final    STAPHYLOCOCCUS EPIDERMIDIS THE SIGNIFICANCE OF ISOLATING THIS ORGANISM FROM A SINGLE SET OF BLOOD CULTURES WHEN MULTIPLE SETS ARE DRAWN IS UNCERTAIN. PLEASE NOTIFY THE MICROBIOLOGY DEPARTMENT WITHIN ONE WEEK IF SPECIATION AND SENSITIVITIES ARE REQUIRED. MICROCOCCUS SPECIES Standardized susceptibility testing for this organism is not available. KOCURIA RHIZOPHILIA Performed at [facility], [place] [address] [place], [place] [address]    Report Status 06/10/2020 FINAL  Final  Resp Panel by RT-PCR (Flu A&B, Covid) Nasopharyngeal Swab      Status: None   Collection Time: 06/04/20  5:18 PM   Specimen: Nasopharyngeal Swab; Nasopharyngeal(NP) swabs in vial transport medium  Result Value Ref Range Status   SARS Coronavirus 2 by RT PCR NEGATIVE NEGATIVE Final    Comment: (NOTE) SARS-CoV-2 target nucleic acids are NOT DETECTED.  The SARS-CoV-2 RNA is generally detectable in upper respiratory specimens during the acute phase of infection. The lowest concentration of SARS-CoV-2 viral copies this assay can detect is 138 copies/mL. A negative result does not preclude SARS-Cov-2 infection and should not be used as the sole basis for treatment or other patient management decisions. A negative result may occur with  improper specimen collection/handling, submission of specimen other than nasopharyngeal swab, presence of viral mutation(s) within the areas targeted by this assay, and inadequate number of viral copies(<138 copies/mL). A negative result must be combined with clinical observations, patient history, and epidemiological information. The expected result is Negative.  Fact Sheet for Patients:  EntrepreneurPulse.com.au  Fact Sheet for Healthcare Providers:  IncredibleEmployment.be  This test is no t yet approved or cleared by the [place] FDA and  has been authorized for detection and/or diagnosis of SARS-CoV-2 by FDA under an Emergency Use Authorization (EUA). This EUA will remain  in effect (meaning this test can be used) for the duration of the COVID-19 declaration under Section 564(b)(1) of the Act, 21 U.S.C.section 360bbb-3(b)(1), unless the authorization is terminated  or revoked sooner.       Influenza A by PCR NEGATIVE NEGATIVE Final   Influenza B by PCR NEGATIVE NEGATIVE Final    Comment: (NOTE) The Xpert Xpress SARS-CoV-2/FLU/RSV plus assay is intended as an aid in the diagnosis of influenza from Nasopharyngeal swab specimens and should not be used as a sole basis  for treatment. Nasal washings and aspirates are unacceptable for Xpert Xpress SARS-CoV-2/FLU/RSV testing.  Fact Sheet for Patients: EntrepreneurPulse.com.au  Fact Sheet for Healthcare Providers: IncredibleEmployment.be  This test is not yet approved or cleared by the [place] FDA and has been authorized for detection and/or diagnosis of SARS-CoV-2 by FDA under an Emergency Use Authorization (EUA). This EUA will remain in effect (meaning this test can be used) for the duration of the COVID-19 declaration under Section 564(b)(1) of the Act, 21 U.S.C. section 360bbb-3(b)(1), unless the authorization is terminated or revoked.  Performed at [facility], [address] [place], [place] [address]   Blood Culture ID Panel (Reflexed)     Status: Abnormal   Collection Time: 06/04/20  5:18 PM  Result Value Ref Range Status   Enterococcus faecalis NOT DETECTED NOT DETECTED Final   Enterococcus Faecium NOT DETECTED NOT DETECTED Final   Listeria monocytogenes NOT DETECTED NOT DETECTED Final   Staphylococcus species DETECTED (A) NOT DETECTED Final    Comment:  CRITICAL RESULT CALLED TO, READ BACK BY AND VERIFIED WITH: Elvera Maria [id number] 06/06/2020 DLB    Staphylococcus aureus (BCID) NOT DETECTED NOT DETECTED Final   Staphylococcus epidermidis DETECTED (A) NOT DETECTED Final    Comment: CRITICAL RESULT CALLED TO, READ BACK BY AND VERIFIED WITH: Elvera Maria [id number] 06/06/2020 DLB    Staphylococcus lugdunensis NOT DETECTED NOT DETECTED Final   Streptococcus species NOT DETECTED NOT DETECTED Final   Streptococcus agalactiae NOT DETECTED NOT DETECTED Final   Streptococcus pneumoniae NOT DETECTED NOT DETECTED Final   Streptococcus pyogenes NOT DETECTED NOT DETECTED Final   A.calcoaceticus-baumannii NOT DETECTED NOT DETECTED Final   Bacteroides fragilis NOT DETECTED NOT DETECTED Final   Enterobacterales NOT DETECTED NOT DETECTED Final    Enterobacter cloacae complex NOT DETECTED NOT DETECTED Final   Escherichia coli NOT DETECTED NOT DETECTED Final   Klebsiella aerogenes NOT DETECTED NOT DETECTED Final   Klebsiella oxytoca NOT DETECTED NOT DETECTED Final   Klebsiella pneumoniae NOT DETECTED NOT DETECTED Final   Proteus species NOT DETECTED NOT DETECTED Final   Salmonella species NOT DETECTED NOT DETECTED Final   Serratia marcescens NOT DETECTED NOT DETECTED Final   Haemophilus influenzae NOT DETECTED NOT DETECTED Final   Neisseria meningitidis NOT DETECTED NOT DETECTED Final   Pseudomonas aeruginosa NOT DETECTED NOT DETECTED Final   Stenotrophomonas maltophilia NOT DETECTED NOT DETECTED Final   Candida albicans NOT DETECTED NOT DETECTED Final   Candida auris NOT DETECTED NOT DETECTED Final   Candida glabrata NOT DETECTED NOT DETECTED Final   Candida krusei NOT DETECTED NOT DETECTED Final   Candida parapsilosis NOT DETECTED NOT DETECTED Final   Candida tropicalis NOT DETECTED NOT DETECTED Final   Cryptococcus neoformans/gattii NOT DETECTED NOT DETECTED Final   Methicillin resistance mecA/C NOT DETECTED NOT DETECTED Final    Comment: Performed at [facility], [address] [place], [place] [address]  Blood culture (single)     Status: None   Collection Time: 06/04/20  9:48 PM   Specimen: BLOOD  Result Value Ref Range Status   Specimen Description BLOOD BLOOD RIGHT ARM  Final   Special Requests   Final    BOTTLES DRAWN AEROBIC AND ANAEROBIC Blood Culture adequate volume   Culture   Final    NO GROWTH 5 DAYS Performed at [facility], [address], [place], [place] [address]    Report Status 06/09/2020 FINAL  Final  Urine Culture     Status: None   Collection Time: 06/06/20  2:37 PM   Specimen: Urine, Random  Result Value Ref Range Status   Specimen Description   Final    URINE, RANDOM Performed at [facility], [address], [place], [place] [address]    Special Requests    Final    NONE Performed at [facility], [address] [place], [place] [address]    Culture   Final    NO GROWTH Performed at [facility], [address] [place], [place] [address]    Report Status 06/08/2020 FINAL  Final  MRSA PCR Screening     Status: None   Collection Time: 06/06/20  2:40 PM   Specimen: Nasopharyngeal  Result Value Ref Range Status   MRSA by PCR NEGATIVE NEGATIVE Final    Comment:        The GeneXpert MRSA Assay (FDA approved for NASAL specimens only), is one component of a comprehensive MRSA colonization surveillance program. It is not intended to diagnose MRSA  infection nor to guide or monitor treatment for MRSA infections. Performed at [facility], [place], [place] [address]   Acid Fast Smear (AFB)     Status: None   Collection Time: 06/06/20  6:30 PM   Specimen: Bronchoalveolar Lavage; Sputum  Result Value Ref Range Status   AFB Specimen Processing Concentration  Final   Acid Fast Smear Negative  Final    Comment: (NOTE) Performed At: [facility] [place], [place] [id number] Rush Farmer MD PY:[id number]    Source (AFB) BRONCHIAL ALVEOLAR LAVAGE  Corrected    Comment: Performed at [facility], [address] [place], [place] [address] CORRECTED ON 06/02 AT 2239: PREVIOUSLY REPORTED AS SPUTUM   Culture, Respiratory w Gram Stain     Status: None   Collection Time: 06/06/20  6:30 PM   Specimen: Bronchoalveolar Lavage  Result Value Ref Range Status   Specimen Description   Final    BRONCHIAL ALVEOLAR LAVAGE Performed at [facility], [address], [place], [place] [address]    Special Requests   Final    NONE Performed at [facility], [address] [place], [place] [address]    Gram Stain   Final    FEW WBC PRESENT,BOTH PMN AND MONONUCLEAR NO ORGANISMS SEEN    Culture   Final    NO GROWTH 3 DAYS Performed at [place], [place] [address] [place], [place] [address]    Report Status 06/09/2020 FINAL  Final  Fungus Culture With Stain     Status: None (Preliminary result)   Collection Time: 06/06/20  6:30 PM  Result Value Ref Range Status   Fungus Stain Final report  Final    Comment: (NOTE) Performed At: [facility] [place], [place] [id number] Rush Farmer MD [id number]    Fungus (Mycology) Culture PENDING  Incomplete   Fungal Source BRONCHIAL ALVEOLAR LAVAGE  Final    Comment: Performed at [facility], [place] [address] [place], [place] [address]  Fungus Culture Result     Status: None   Collection Time: 06/06/20  6:30 PM  Result Value Ref Range Status   Result 1 Comment  Final    Comment: (NOTE) KOH/Calcofluor preparation:  no fungus observed. Performed At: [facility] [place], [place] [id number] Rush Farmer MD GY:[id number]   Respiratory (~20 pathogens) panel by PCR     Status: None   Collection Time: 06/07/20  5:50 AM   Specimen: Nasopharyngeal Swab; Respiratory  Result Value Ref Range Status   Adenovirus NOT DETECTED NOT DETECTED Final   Coronavirus 229E NOT DETECTED NOT DETECTED Final    Comment: (NOTE) The Coronavirus on the Respiratory Panel, DOES NOT test for the novel  Coronavirus (2019 nCoV)    Coronavirus HKU1 NOT DETECTED NOT DETECTED Final   Coronavirus NL63 NOT DETECTED NOT DETECTED Final   Coronavirus OC43 NOT DETECTED NOT DETECTED Final   Metapneumovirus NOT DETECTED NOT DETECTED Final   Rhinovirus / Enterovirus NOT DETECTED NOT DETECTED Final   Influenza A NOT DETECTED NOT DETECTED Final   Influenza B NOT DETECTED NOT DETECTED Final   Parainfluenza Virus 1 NOT DETECTED NOT DETECTED Final   Parainfluenza Virus 2 NOT DETECTED NOT DETECTED Final   Parainfluenza Virus 3 NOT DETECTED NOT DETECTED Final   Parainfluenza Virus 4 NOT DETECTED NOT DETECTED Final   Respiratory Syncytial Virus NOT DETECTED NOT DETECTED Final    Bordetella  pertussis NOT DETECTED NOT DETECTED Final   Bordetella Parapertussis NOT DETECTED NOT DETECTED Final   Chlamydophila pneumoniae NOT DETECTED NOT DETECTED Final   Mycoplasma pneumoniae NOT DETECTED NOT DETECTED Final    Comment: Performed at [facility], [place] [address], [place], [place] [address]  Culture, Respiratory w Gram Stain     Status: None   Collection Time: 06/18/20  4:43 PM   Specimen: Tracheal Aspirate; Respiratory  Result Value Ref Range Status   Specimen Description   Final    TRACHEAL ASPIRATE Performed at [facility], [address], [place], [place] [address]    Special Requests   Final    NONE Performed at [facility], [address] [place], [place] [address]    Gram Stain   Final    MODERATE WBC PRESENT,BOTH PMN AND MONONUCLEAR FEW YEAST RARE GRAM NEGATIVE RODS Performed at [facility], [place] [address], [place], [place] [address]    Culture FEW CANDIDA ALBICANS  Final   Report Status 06/21/2020 FINAL  Final    Coagulation Studies: No results for input(s): LABPROT, INR in the last 72 hours.  Urinalysis: No results for input(s): COLORURINE, LABSPEC, PHURINE, GLUCOSEU, HGBUR, BILIRUBINUR, KETONESUR, PROTEINUR, UROBILINOGEN, NITRITE, LEUKOCYTESUR in the last 72 hours.  Invalid input(s): APPERANCEUR    Imaging: DG Abd 1 View  Result Date: 06/21/2020 CLINICAL DATA:  Nasogastric tube placement. EXAM: ABDOMEN - 1 VIEW COMPARISON:  Radiograph 07/18/2020 FINDINGS: Tip of the enteric tube is below the diaphragm in the stomach, the side port is just beyond the gastroesophageal junction. Decreased gaseous colonic distension from prior exam. No evidence of obstruction. IMPRESSION: Tip of the enteric tube below the diaphragm in the stomach, side port just beyond the gastroesophageal junction. Electronically Signed   By: Keith Rake M.D.   On: 06/21/2020 22:21     Medications:    sodium chloride 10 mL/hr at 06/22/20  0800    amLODipine  5 mg Per Tube Daily   chlorhexidine gluconate (MEDLINE KIT)  15 mL Mouth Rinse BID   Chlorhexidine Gluconate Cloth  6 each Topical Daily   cloNIDine  0.3 mg Transdermal Weekly   feeding supplement (NEPRO CARB STEADY)  1,000 mL Per Tube Q24H   feeding supplement (PROSource TF)  45 mL Per Tube Daily   free water  30 mL Per Tube Q4H   heparin injection (subcutaneous)  5,000 Units Subcutaneous Q8H   insulin aspart  0-20 Units Subcutaneous Q4H   insulin glargine  20 Units Subcutaneous Daily   multivitamin  1 tablet Per Tube QHS   nutrition supplement (JUVEN)  1 packet Per Tube BID   pantoprazole (PROTONIX) IV  40 mg Intravenous Q24H   prazosin  4 mg Per Tube QHS   sodium bicarbonate  1,300 mg Per Tube BID   sodium chloride flush  10-40 mL Intracatheter Q12H   sodium chloride, heparin sodium (porcine), heparin sodium (porcine), hydrALAZINE, labetalol, ondansetron **OR** ondansetron (ZOFRAN) IV, prazosin, sodium chloride flush  Assessment/ Plan:  Mr. Sadat [place] Brooke Ball. is a 60 y.o. white male with hypertension, PTSD, diabetes mellitus type II, migraines, GERD, neurogenic bladder, history of transverse myelitis, who is admitted to [facility] on 06/04/2020 for Sepsis ([place]) [A41.9] Altered mental status, unspecified altered mental status type [R41.82] Community acquired pneumonia, unspecified laterality [J18.9]  First dialysis was 6/6.   Acute kidney injury: admitting creatinine of 1.06 with GFR >60. Underlying proteinuria, hematuria, glycosuria consistent with diabetic nephropathy. Patient was also  taking ibuprofen regularly at home. Patient with the [place], baseline creatinine not available. Acute kidney injury secondary to sepsis, ATN, IV contrast. Serologic work up negative. First hemodialysis treatment on June 6. Last treatment on 6/17. Monitor daily for dialysis need. Next dialysis scheduled for Monday. Will discontinue sodium bicarbonate.  Hypertension: elevated readings,  149/82. Current regimen of prazosin, amlodipine and clonidine patch. Home regimen of lisinopril and metoprolol, both are on hold.  Diabetes mellitus type II with renal manifestations: noninsulin dependent. holding home metformin. Hemoglobin A1c of 7.4%.  Altered mental status: concerning. Especially with history of transverse myelitis. Agree with neurology consultation.    LOS: 18 Wyatt Ball 6/18/20229:48 AM

## 2020-06-23 DIAGNOSIS — G9341 Metabolic encephalopathy: Secondary | ICD-10-CM | POA: Diagnosis not present

## 2020-06-23 DIAGNOSIS — G9340 Encephalopathy, unspecified: Secondary | ICD-10-CM | POA: Diagnosis not present

## 2020-06-23 LAB — BASIC METABOLIC PANEL
Anion gap: 10 (ref 5–15)
BUN: 126 mg/dL — ABNORMAL HIGH (ref 8–23)
CO2: 27 mmol/L (ref 22–32)
Calcium: 9.3 mg/dL (ref 8.9–10.3)
Chloride: 109 mmol/L (ref 98–111)
Creatinine, Ser: 4.52 mg/dL — ABNORMAL HIGH (ref 0.61–1.24)
GFR, Estimated: 14 mL/min — ABNORMAL LOW (ref >60)
Glucose, Bld: 175 mg/dL — ABNORMAL HIGH (ref 70–99)
Potassium: 3.5 mmol/L (ref 3.5–5.1)
Sodium: 146 mmol/L — ABNORMAL HIGH (ref 135–145)

## 2020-06-23 LAB — GLUCOSE, CAPILLARY
Glucose-Capillary: 138 mg/dL — ABNORMAL HIGH (ref 70–99)
Glucose-Capillary: 174 mg/dL — ABNORMAL HIGH (ref 70–99)
Glucose-Capillary: 181 mg/dL — ABNORMAL HIGH (ref 70–99)
Glucose-Capillary: 187 mg/dL — ABNORMAL HIGH (ref 70–99)
Glucose-Capillary: 197 mg/dL — ABNORMAL HIGH (ref 70–99)
Glucose-Capillary: 206 mg/dL — ABNORMAL HIGH (ref 70–99)

## 2020-06-23 LAB — CBC WITH DIFFERENTIAL/PLATELET
Abs Immature Granulocytes: 0.03 10*3/uL (ref 0.00–0.07)
Basophils Absolute: 0.1 10*3/uL (ref 0.0–0.1)
Basophils Relative: 1 %
Eosinophils Absolute: 0 10*3/uL (ref 0.0–0.5)
Eosinophils Relative: 0 %
HCT: 27.2 % — ABNORMAL LOW (ref 39.0–52.0)
Hemoglobin: 8.8 g/dL — ABNORMAL LOW (ref 13.0–17.0)
Immature Granulocytes: 0 %
Lymphocytes Relative: 12 %
Lymphs Abs: 1 10*3/uL (ref 0.7–4.0)
MCH: 29.4 pg (ref 26.0–34.0)
MCHC: 32.4 g/dL (ref 30.0–36.0)
MCV: 91 fL (ref 80.0–100.0)
Monocytes Absolute: 0.5 10*3/uL (ref 0.1–1.0)
Monocytes Relative: 7 %
Neutro Abs: 6.5 10*3/uL (ref 1.7–7.7)
Neutrophils Relative %: 80 %
Platelets: 256 10*3/uL (ref 150–400)
RBC: 2.99 MIL/uL — ABNORMAL LOW (ref 4.22–5.81)
RDW: 14.9 % (ref 11.5–15.5)
WBC: 8.1 10*3/uL (ref 4.0–10.5)
nRBC: 0 % (ref 0.0–0.2)

## 2020-06-23 LAB — MAGNESIUM: Magnesium: 2.5 mg/dL — ABNORMAL HIGH (ref 1.7–2.4)

## 2020-06-23 LAB — PHOSPHORUS: Phosphorus: 8.8 mg/dL — ABNORMAL HIGH (ref 2.5–4.6)

## 2020-06-23 MED ORDER — OXYCODONE-ACETAMINOPHEN 5-325 MG PO TABS
1.0000 | ORAL_TABLET | ORAL | Status: DC | PRN
Start: 2020-06-23 — End: 2020-06-23

## 2020-06-23 MED ORDER — GABAPENTIN 300 MG PO CAPS: 300.0000 mg | ORAL_CAPSULE | Freq: Three times a day (TID) | ORAL | Status: DC

## 2020-06-23 MED ORDER — OXYCODONE-ACETAMINOPHEN 5-325 MG PO TABS
1.0000 | ORAL_TABLET | ORAL | Status: DC | PRN
  Administered 2020-06-23 – 2020-07-27 (×75): 1 via NASOGASTRIC
  Filled 2020-06-23 (×75): qty 1

## 2020-06-23 MED ORDER — DULOXETINE HCL 30 MG PO CPEP: 60.0000 mg | ORAL_CAPSULE | Freq: Every day | ORAL | Status: DC

## 2020-06-23 MED ORDER — THIAMINE HCL 100 MG/ML IJ SOLN
500.0000 mg | Freq: Three times a day (TID) | INTRAVENOUS | Status: DC
  Filled 2020-06-23 (×2): qty 5

## 2020-06-23 MED ORDER — FREE WATER
100.0000 mL | Status: DC
  Administered 2020-06-23 – 2020-07-02 (×51): 100 mL

## 2020-06-23 NOTE — Progress Notes (Signed)
[facility]  ROUNDING NOTE   Subjective:   More awake this morning. Still unable to communicate much.  UOP 1645m BUN 126 (91) Creatinine 4.52 (3.7)  Objective:  Vital signs in last 24 hours:  Temp:  [98.2 F (36.8 C)-98.8 F (37.1 C)] 98.4 F (36.9 C) (06/19 0800) Pulse Rate:  [86-119] 95 (06/19 0900) Resp:  [5-27] 13 (06/19 0900) BP: (117-175)/(59-87) 167/83 (06/19 0900) SpO2:  [90 %-98 %] 96 % (06/19 0900)  Weight change:  Filed Weights   06/19/20 0500 06/20/20 0500 06/21/20 0530  Weight: 98.1 kg 98.1 kg 85.2 kg    Intake/Output: I/O last 3 completed shifts: In: 3304.3 [I.V.:232.1; NG/GT:3072.3] Out: 2525 [Urine:2525]   Intake/Output this shift:  Total I/O In: 110 [NG/GT:110] Out: -   Physical Exam: General: NAD, laying in bed  Head: Normocephalic, atraumatic. Moist oral mucosal membranes  Eyes: Anicteric, PERRL  Neck: Supple, trachea midline  Lungs:  Clear to auscultation  Heart: Regular rate and rhythm  Abdomen:  Soft, nontender  Extremities:  no peripheral edema.  Neurologic: Moving all four extremities. Following commands  Skin: No lesions  Access: Left IJ temp HD catheter 6/6  GU Foley    Basic Metabolic Panel: Recent Labs  Lab 06/19/20 0448 06/20/20 0417 06/21/20 0606 06/22/20 0542 06/23/20 0454  NA 148* 144 149* 142 146*  K 5.3* 4.4 4.3 3.7 3.5  CL 110 106 110 105 109  CO2 23 22 21* 27 27  GLUCOSE 179* 119* 134* 170* 175*  BUN 174* 112* 131* 91* 126*  CREATININE 5.87* 4.43* 5.19* 3.70* 4.52*  CALCIUM 8.5* 8.5* 8.9 8.7* 9.3  MG 2.7* 2.5* 2.8* 2.2 2.5*  PHOS 12.8* 10.6* 11.9* 9.3* 8.8*     Liver Function Tests: Recent Labs  Lab 06/22/20 0542  AST 35  ALT 38  ALKPHOS 66  BILITOT 0.8  PROT 6.2*  ALBUMIN 2.6*    Recent Labs  Lab 06/20/20 1516  LIPASE 40  AMYLASE 61    No results for input(s): AMMONIA in the last 168 hours.  CBC: Recent Labs  Lab 06/19/20 0448 06/20/20 0417 06/21/20 0606  06/22/20 0542 06/23/20 0454  WBC 7.2 10.7* 8.5 7.0 8.1  NEUTROABS 5.9 9.0* 7.1 5.4 6.5  HGB 8.1* 8.9* 9.6* 9.1* 8.8*  HCT 23.5* 27.2* 28.9* 26.6* 27.2*  MCV 86.7 89.2 88.7 88.4 91.0  PLT 309 323 322 254 256     Cardiac Enzymes: No results for input(s): CKTOTAL, CKMB, CKMBINDEX, TROPONINI in the last 168 hours.  BNP: Invalid input(s): POCBNP  CBG: Recent Labs  Lab 06/22/20 1616 06/22/20 1920 06/22/20 2329 06/23/20 0321 06/23/20 0758  GLUCAP 199* 206* 199* 206* 187*     Microbiology: Results for orders placed or performed during the hospital encounter of 06/04/20  Blood culture (single)     Status: Abnormal   Collection Time: 06/04/20  5:18 PM   Specimen: BLOOD  Result Value Ref Range Status   Specimen Description   Final    BLOOD BLOOD RIGHT HAND Performed at [facility] [address], [place] [address]   Special Requests   Final    BOTTLES DRAWN AEROBIC AND ANAEROBIC Blood Culture adequate volume Performed at [facility] [address], [place] [address]   Culture  Setup Time   Final    GRAM POSITIVE COCCI IN BOTH AEROBIC AND ANAEROBIC BOTTLES CRITICAL RESULT CALLED TO, READ BACK BY AND VERIFIED WITH: DElvera [id number]/02/2020 DLB Performed  at [facility], [address] [place], [place] [address]    Culture (A)  Final    STAPHYLOCOCCUS EPIDERMIDIS THE SIGNIFICANCE OF ISOLATING THIS ORGANISM FROM A SINGLE SET OF BLOOD CULTURES WHEN MULTIPLE SETS ARE DRAWN IS UNCERTAIN. PLEASE NOTIFY THE MICROBIOLOGY DEPARTMENT WITHIN ONE WEEK IF SPECIATION AND SENSITIVITIES ARE REQUIRED. MICROCOCCUS SPECIES Standardized susceptibility testing for this organism is not available. KOCURIA RHIZOPHILIA Performed at [facility], [place] [address], [place], [place] [address]    Report Status 06/10/2020 FINAL  Final  Resp Panel by RT-PCR (Flu A&B, Covid) Nasopharyngeal Swab     Status: None   Collection Time: 06/04/20   5:18 PM   Specimen: Nasopharyngeal Swab; Nasopharyngeal(NP) swabs in vial transport medium  Result Value Ref Range Status   SARS Coronavirus 2 by RT PCR NEGATIVE NEGATIVE Final    Comment: (NOTE) SARS-CoV-2 target nucleic acids are NOT DETECTED.  The SARS-CoV-2 RNA is generally detectable in upper respiratory specimens during the acute phase of infection. The lowest concentration of SARS-CoV-2 viral copies this assay can detect is 138 copies/mL. A negative result does not preclude SARS-Cov-2 infection and should not be used as the sole basis for treatment or other patient management decisions. A negative result may occur with  improper specimen collection/handling, submission of specimen other than nasopharyngeal swab, presence of viral mutation(s) within the areas targeted by this assay, and inadequate number of viral copies(<138 copies/mL). A negative result must be combined with clinical observations, patient history, and epidemiological information. The expected result is Negative.  Fact Sheet for Patients:  EntrepreneurPulse.com.au  Fact Sheet for Healthcare Providers:  IncredibleEmployment.be  This test is no t yet approved or cleared by the [place] FDA and  has been authorized for detection and/or diagnosis of SARS-CoV-2 by FDA under an Emergency Use Authorization (EUA). This EUA will remain  in effect (meaning this test can be used) for the duration of the COVID-19 declaration under Section 564(b)(1) of the Act, 21 U.S.C.section 360bbb-3(b)(1), unless the authorization is terminated  or revoked sooner.       Influenza A by PCR NEGATIVE NEGATIVE Final   Influenza B by PCR NEGATIVE NEGATIVE Final    Comment: (NOTE) The Xpert Xpress SARS-CoV-2/FLU/RSV plus assay is intended as an aid in the diagnosis of influenza from Nasopharyngeal swab specimens and should not be used as a sole basis for treatment. Nasal washings and aspirates  are unacceptable for Xpert Xpress SARS-CoV-2/FLU/RSV testing.  Fact Sheet for Patients: EntrepreneurPulse.com.au  Fact Sheet for Healthcare Providers: IncredibleEmployment.be  This test is not yet approved or cleared by the [place] FDA and has been authorized for detection and/or diagnosis of SARS-CoV-2 by FDA under an Emergency Use Authorization (EUA). This EUA will remain in effect (meaning this test can be used) for the duration of the COVID-19 declaration under Section 564(b)(1) of the Act, 21 U.S.C. section 360bbb-3(b)(1), unless the authorization is terminated or revoked.  Performed at [facility], [address] [place],  [address]   Blood Culture ID Panel (Reflexed)     Status: Abnormal   Collection Time: 06/04/20  5:18 PM  Result Value Ref Range Status   Enterococcus faecalis NOT DETECTED NOT DETECTED Final   Enterococcus Faecium NOT DETECTED NOT DETECTED Final   Listeria monocytogenes NOT DETECTED NOT DETECTED Final   Staphylococcus species DETECTED (A) NOT DETECTED Final    Comment: CRITICAL RESULT CALLED TO, READ BACK BY AND VERIFIED WITH: Elvera Maria [id number] 06/06/2020 DLB  Staphylococcus aureus (BCID) NOT DETECTED NOT DETECTED Final   Staphylococcus epidermidis DETECTED (A) NOT DETECTED Final    Comment: CRITICAL RESULT CALLED TO, READ BACK BY AND VERIFIED WITH: Elvera Maria [id number] 06/06/2020 DLB    Staphylococcus lugdunensis NOT DETECTED NOT DETECTED Final   Streptococcus species NOT DETECTED NOT DETECTED Final   Streptococcus agalactiae NOT DETECTED NOT DETECTED Final   Streptococcus pneumoniae NOT DETECTED NOT DETECTED Final   Streptococcus pyogenes NOT DETECTED NOT DETECTED Final   A.calcoaceticus-baumannii NOT DETECTED NOT DETECTED Final   Bacteroides fragilis NOT DETECTED NOT DETECTED Final   Enterobacterales NOT DETECTED NOT DETECTED Final   Enterobacter cloacae complex NOT DETECTED NOT DETECTED  Final   Escherichia coli NOT DETECTED NOT DETECTED Final   Klebsiella aerogenes NOT DETECTED NOT DETECTED Final   Klebsiella oxytoca NOT DETECTED NOT DETECTED Final   Klebsiella pneumoniae NOT DETECTED NOT DETECTED Final   Proteus species NOT DETECTED NOT DETECTED Final   Salmonella species NOT DETECTED NOT DETECTED Final   Serratia marcescens NOT DETECTED NOT DETECTED Final   Haemophilus influenzae NOT DETECTED NOT DETECTED Final   Neisseria meningitidis NOT DETECTED NOT DETECTED Final   Pseudomonas aeruginosa NOT DETECTED NOT DETECTED Final   Stenotrophomonas maltophilia NOT DETECTED NOT DETECTED Final   Candida albicans NOT DETECTED NOT DETECTED Final   Candida auris NOT DETECTED NOT DETECTED Final   Candida glabrata NOT DETECTED NOT DETECTED Final   Candida krusei NOT DETECTED NOT DETECTED Final   Candida parapsilosis NOT DETECTED NOT DETECTED Final   Candida tropicalis NOT DETECTED NOT DETECTED Final   Cryptococcus neoformans/gattii NOT DETECTED NOT DETECTED Final   Methicillin resistance mecA/C NOT DETECTED NOT DETECTED Final    Comment: Performed at [facility], [address] [place], [place] [address]  Blood culture (single)     Status: None   Collection Time: 06/04/20  9:48 PM   Specimen: BLOOD  Result Value Ref Range Status   Specimen Description BLOOD BLOOD RIGHT ARM  Final   Special Requests   Final    BOTTLES DRAWN AEROBIC AND ANAEROBIC Blood Culture adequate volume   Culture   Final    NO GROWTH 5 DAYS Performed at [facility], [address], [place], [place] [address]    Report Status 06/09/2020 FINAL  Final  Urine Culture     Status: None   Collection Time: 06/06/20  2:37 PM   Specimen: Urine, Random  Result Value Ref Range Status   Specimen Description   Final    URINE, RANDOM Performed at [facility], [address], [place], [place] [address]    Special Requests   Final    NONE Performed at [facility], [address] [place], [place] [address]    Culture   Final    NO GROWTH Performed at [facility], [address] [place], [place] [address]    Report Status 06/08/2020 FINAL  Final  MRSA PCR Screening     Status: None   Collection Time: 06/06/20  2:40 PM   Specimen: Nasopharyngeal  Result Value Ref Range Status   MRSA by PCR NEGATIVE NEGATIVE Final    Comment:        The GeneXpert MRSA Assay (FDA approved for NASAL specimens only), is one component of a comprehensive MRSA colonization surveillance program. It is not intended to diagnose MRSA infection nor to guide or monitor treatment for MRSA infections. Performed at [facility], [address] [place], [place] [address]   Acid Fast Smear (AFB)     Status: None   Collection Time: 06/06/20  6:30 PM   Specimen: Bronchoalveolar Lavage; Sputum  Result Value Ref Range Status   AFB Specimen Processing Concentration  Final   Acid Fast Smear Negative  Final    Comment: (NOTE) Performed At: [facility] [place], [place] [id number] Rush Farmer MD [id number]    Source (AFB) BRONCHIAL ALVEOLAR LAVAGE  Corrected    Comment: Performed at [facility], [address] [place], [place] [address] CORRECTED ON 06/02 AT 2239: PREVIOUSLY REPORTED AS SPUTUM   Culture, Respiratory w Gram Stain     Status: None   Collection Time: 06/06/20  6:30 PM   Specimen: Bronchoalveolar Lavage  Result Value Ref Range Status   Specimen Description   Final    BRONCHIAL ALVEOLAR LAVAGE Performed at [facility], [address], [place], [place] [address]    Special Requests   Final    NONE Performed at [facility], [address] [place], [place] [address]    Gram Stain   Final    FEW WBC PRESENT,BOTH PMN AND MONONUCLEAR NO ORGANISMS SEEN    Culture   Final    NO GROWTH 3 DAYS Performed at [facility], [place] [address], [place], [place] [address]    Report  Status 06/09/2020 FINAL  Final  Fungus Culture With Stain     Status: None (Preliminary result)   Collection Time: 06/06/20  6:30 PM  Result Value Ref Range Status   Fungus Stain Final report  Final    Comment: (NOTE) Performed At: [facility] [place], [place] [id number] Rush Farmer MD [id number]:[id number]    Fungus (Mycology) Culture PENDING  Incomplete   Fungal Source BRONCHIAL ALVEOLAR LAVAGE  Final    Comment: Performed at [facility], [place] [address], [place], [place] [address]  Fungus Culture Result     Status: None   Collection Time: 06/06/20  6:30 PM  Result Value Ref Range Status   Result 1 Comment  Final    Comment: (NOTE) KOH/Calcofluor preparation:  no fungus observed. Performed At: [facility] [place], [place] [id number] Rush Farmer MD EM:[id number]   Respiratory (~20 pathogens) panel by PCR     Status: None   Collection Time: 06/07/20  5:50 AM   Specimen: Nasopharyngeal Swab; Respiratory  Result Value Ref Range Status   Adenovirus NOT DETECTED NOT DETECTED Final   Coronavirus 229E NOT DETECTED NOT DETECTED Final    Comment: (NOTE) The Coronavirus on the Respiratory Panel, DOES NOT test for the novel  Coronavirus (2019 nCoV)    Coronavirus HKU1 NOT DETECTED NOT DETECTED Final   Coronavirus NL63 NOT DETECTED NOT DETECTED Final   Coronavirus OC43 NOT DETECTED NOT DETECTED Final   Metapneumovirus NOT DETECTED NOT DETECTED Final   Rhinovirus / Enterovirus NOT DETECTED NOT DETECTED Final   Influenza A NOT DETECTED NOT DETECTED Final   Influenza B NOT DETECTED NOT DETECTED Final   Parainfluenza Virus 1 NOT DETECTED NOT DETECTED Final   Parainfluenza Virus 2 NOT DETECTED NOT DETECTED Final   Parainfluenza Virus 3 NOT DETECTED NOT DETECTED Final   Parainfluenza Virus 4 NOT DETECTED NOT DETECTED Final   Respiratory Syncytial Virus NOT DETECTED NOT DETECTED Final   Bordetella pertussis NOT DETECTED NOT DETECTED Final    Bordetella Parapertussis NOT DETECTED NOT DETECTED Final   Chlamydophila  pneumoniae NOT DETECTED NOT DETECTED Final   Mycoplasma pneumoniae NOT DETECTED NOT DETECTED Final    Comment: Performed at [facility], [place] [address] [place], [place] [address]  Culture, Respiratory w Gram Stain     Status: None   Collection Time: 06/18/20  4:43 PM   Specimen: Tracheal Aspirate; Respiratory  Result Value Ref Range Status   Specimen Description   Final    TRACHEAL ASPIRATE Performed at [facility], [address] [place], [place] [address]    Special Requests   Final    NONE Performed at [facility], [address] [place], [place] [address]    Gram Stain   Final    MODERATE WBC PRESENT,BOTH PMN AND MONONUCLEAR FEW YEAST RARE GRAM NEGATIVE RODS Performed at [facility], [place] [address] [place], [place] [address]    Culture FEW CANDIDA ALBICANS  Final   Report Status 06/21/2020 FINAL  Final    Coagulation Studies: No results for input(s): LABPROT, INR in the last 72 hours.  Urinalysis: No results for input(s): COLORURINE, LABSPEC, PHURINE, GLUCOSEU, HGBUR, BILIRUBINUR, KETONESUR, PROTEINUR, UROBILINOGEN, NITRITE, LEUKOCYTESUR in the last 72 hours.  Invalid input(s): APPERANCEUR    Imaging: DG Abd 1 View  Result Date: 06/21/2020 CLINICAL DATA:  Nasogastric tube placement. EXAM: ABDOMEN - 1 VIEW COMPARISON:  Radiograph 07/18/2020 FINDINGS: Tip of the enteric tube is below the diaphragm in the stomach, the side port is just beyond the gastroesophageal junction. Decreased gaseous colonic distension from prior exam. No evidence of obstruction. IMPRESSION: Tip of the enteric tube below the diaphragm in the stomach, side port just beyond the gastroesophageal junction. Electronically Signed   By: Keith Rake M.D.   On: 06/21/2020 22:21   CT HEAD WO CONTRAST  Result Date: 06/22/2020 CLINICAL DATA:  Stroke, follow-up. Slow recovery of metabolic  encephalopathy. EXAM: CT HEAD WITHOUT CONTRAST TECHNIQUE: Contiguous axial images were obtained from the base of the skull through the vertex without intravenous contrast. COMPARISON:  MR head 06/07/2020. CT head without contrast 06/04/2020 FINDINGS: Brain: Mild atrophy and white matter changes are stable. No acute infarct, hemorrhage, or mass lesion is present. Basal ganglia are intact. Insular ribbon is normal bilaterally. The brainstem and cerebellum are within normal limits. Vascular: No hyperdense vessel or unexpected calcification. Calvarium is intact. No focal lytic or blastic lesions are present. No significant extracranial soft tissue lesion is present. Skull: Insert normal skull No significant extracranial soft tissue lesion is present. Sinuses/Orbits: Minimal fluid is present in the posterior mastoid air cells bilaterally. No obstructing nasopharyngeal lesion is present. NG tube is in place. Minimal mucosal thickening is present in the inferior maxillary sinuses bilaterally. The paranasal sinuses and mastoid air cells are otherwise clear. The globes and orbits are within normal limits. IMPRESSION: 1. Stable atrophy and white matter disease. 2. No acute intracranial abnormality or significant interval change. Electronically Signed   By: San Morelle M.D.   On: 06/22/2020 16:49     Medications:    sodium chloride Stopped (06/22/20 1614)   feeding supplement (NEPRO CARB STEADY) 1,000 mL (06/22/20 2030)    amLODipine  5 mg Per Tube Daily   chlorhexidine gluconate (MEDLINE KIT)  15 mL Mouth Rinse BID   Chlorhexidine Gluconate Cloth  6 each Topical Daily   cloNIDine  0.3 mg Transdermal Weekly   feeding supplement (PROSource TF)  45 mL Per Tube Daily   free water  30 mL Per Tube Q4H   heparin  injection (subcutaneous)  5,000 Units Subcutaneous Q8H   insulin aspart  0-20 Units Subcutaneous Q4H   insulin aspart  3 Units Subcutaneous Q4H   insulin glargine  20 Units Subcutaneous Daily    multivitamin  1 tablet Per Tube QHS   nutrition supplement (JUVEN)  1 packet Per Tube BID   pantoprazole (PROTONIX) IV  40 mg Intravenous Q24H   prazosin  4 mg Per Tube QHS   sodium chloride flush  10-40 mL Intracatheter Q12H   sodium chloride, heparin sodium (porcine), heparin sodium (porcine), hydrALAZINE, labetalol, ondansetron **OR** ondansetron (ZOFRAN) IV, prazosin, sodium chloride flush  Assessment/ Plan:  Mr. Wyatt Ball Brooke Bonito. is a 60 y.o. white male with hypertension, PTSD, diabetes mellitus type II, migraines, GERD, neurogenic bladder, history of transverse myelitis, who is admitted to [facility] on 06/04/2020 for Sepsis ([place]) [A41.9] Altered mental status, unspecified altered mental status type [R41.82] Community acquired pneumonia, unspecified laterality [J18.9]  First dialysis was 6/6.   Acute kidney injury: admitting creatinine of 1.06 with GFR >60. Underlying proteinuria, hematuria, glycosuria consistent with diabetic nephropathy. Patient was also taking ibuprofen regularly at home. Patient with the [facility], baseline creatinine not available. Acute kidney injury secondary to sepsis, ATN, IV contrast. Serologic work up negative. First hemodialysis treatment on June 6. Last treatment on 6/17. Monitor daily for dialysis need. Next dialysis scheduled for Monday.  Hypertension: elevated readings, 167/83. Current regimen of prazosin, amlodipine and clonidine patch. Home regimen of lisinopril and metoprolol, both are on hold.  Diabetes mellitus type II with renal manifestations: noninsulin dependent. holding home metformin. Hemoglobin A1c of 7.4%.  Altered mental status: concerning. Especially with history of transverse myelitis. Agree with neurology consultation.    LOS: 19 Wacey Zieger 6/19/202211:08 AM

## 2020-06-23 NOTE — Progress Notes (Signed)
NAME:  Wyatt Ball., MRN:  [id number], DOB:  [birth year], LOS: 13 ADMISSION DATE:  06/04/2020 60 y.o. male with medical history significant for HTN, PTSD, diabetes, migraines, GERD, neurogenic bladder secondary to remote history of transverse myelitis, who self catheterizes and has frequent UTIs as well as chronic back pain on chronic opiates who at baseline is independent - brought to the ER with a 3-day history of altered mental status.   + cough, shortness of breath and fever as well as vomiting and diarrhea and started becoming increasingly confused in the 24 hours prior to arrival.       Unable to get any meaningful history from patient.   ED course: On arrival, temperature 99.3, BP 167/67, pulse 99, respirations 18 with O2 sat 96% on room air.  Blood work significant for leukocytosis of 13,600 with lactic acid of 1.2.  Ammonia level normal at 15, EtOH less than 10.  Sodium 130, potassium 3.2, glucose 249 creatinine 1.06.  Urinalysis with 80 ketones and rare bacteria.  UDS positive for opiates and tricyclic's.  COVID and flu negative       Imaging:   CT chest abdomen and pelvis without contrast consistent with pneumonia.  Multiple nodules left upper lobe likely infectious or inflammatory with follow-up imaging recommended to document resolution.   Cardiomegaly with trace pericardial effusion   Patient was started on sepsis fluids and broad-spectrum antibiotics initially for sepsis of unknown source.       Significant Hospital Events: Including procedures, antibiotic start and stop dates in addition to other pertinent events   5/31 admitted for sepsis present on admission for Left lung pneumonia 6/1 progressive confusion and hypoxia 6/2 transferred to ICU, INTUBATED, S/P BRONCH 6/3 high fevers, remains ill, SELF EXTUBATED 6/3 emergently re-intubated 6/5 failure to wean from v ent 6/6 failure to wean from vent, VASC CATH PLACED 6/7 HD started remains critically ill 6/8  remains on vent 6/9 waiting for family for SAT 06/17/2020-patient had dialysis today without acute events.  He was unable to pass SBT even post HD due to Care plan reviewed with infectious disease today.  06/18/20- patient still unable to follow verbal communication.  Repeat SBT today with goal for extubation   06/19/20-  patient is improved, will attempt to liberate from MV today post dialysis and SBT.  06/20/20- patient is improved, he is difficult to engage in conversation, I was able to capture his attention with negotiating food items and he did speak with me some. Unclear if there is psychiatric disease. 06/21/20- patient on room air now, he is non-communicative with encephalopathy, plan for additional dialysis and official swallow evaluation.  06/22/20- patient is improved today with ability to move hands sometimes independently.  His metabolic encephalopathy is very slow to improve, will obtain neurology evaluation.  06/23/20- patient imporved with plan for [facility] transfer.  S/p neuro evalaution for metabolic encephalopathy    CBC    Component Value Date/Time   WBC 8.1 06/23/2020 0454   RBC 2.99 (L) 06/23/2020 0454   HGB 8.8 (L) 06/23/2020 0454   HCT 27.2 (L) 06/23/2020 0454   PLT 256 06/23/2020 0454   MCV 91.0 06/23/2020 0454   MCH 29.4 06/23/2020 0454   MCHC 32.4 06/23/2020 0454   RDW 14.9 06/23/2020 0454   LYMPHSABS 1.0 06/23/2020 0454   MONOABS 0.5 06/23/2020 0454   EOSABS 0.0 06/23/2020 0454   BASOSABS 0.1 06/23/2020 0454     BMP Latest Ref Rng & Units 06/23/2020 06/22/2020 06/21/2020  Glucose 70 - 99 mg/dL 175(H) 170(H) 134(H)  BUN 8 - 23 mg/dL 126(H) 91(H) 131(H)  Creatinine 0.61 - 1.24 mg/dL 4.52(H) 3.70(H) 5.19(H)  Sodium 135 - 145 mmol/L 146(H) 142 149(H)  Potassium 3.5 - 5.1 mmol/L 3.5 3.7 4.3  Chloride 98 - 111 mmol/L 109 105 110  CO2 22 - 32 mmol/L 27 27 21(L)  Calcium 8.9 - 10.3 mg/dL 9.3 8.7(L) 8.9       Objective   Blood pressure (!) 167/83, pulse 95,  temperature 98.4 F (36.9 C), temperature source Oral, resp. rate 13, height 5' 5.98" (1.676 m), weight 85.2 kg, SpO2 96 %.        Intake/Output Summary (Last 24 hours) at 06/23/2020 1228 Last data filed at 06/23/2020 0900 Gross per 24 hour  Intake 1247.26 ml  Output 1675 ml  Net -427.74 ml    Filed Weights   06/19/20 0500 06/20/20 0500 06/21/20 0530  Weight: 98.1 kg 98.1 kg 85.2 kg   Scheduled Meds:  amLODipine  5 mg Per Tube Daily   chlorhexidine gluconate (MEDLINE KIT)  15 mL Mouth Rinse BID   Chlorhexidine Gluconate Cloth  6 each Topical Daily   cloNIDine  0.3 mg Transdermal Weekly   feeding supplement (PROSource TF)  45 mL Per Tube Daily   free water  30 mL Per Tube Q4H   heparin injection (subcutaneous)  5,000 Units Subcutaneous Q8H   insulin aspart  0-20 Units Subcutaneous Q4H   insulin aspart  3 Units Subcutaneous Q4H   insulin glargine  20 Units Subcutaneous Daily   multivitamin  1 tablet Per Tube QHS   nutrition supplement (JUVEN)  1 packet Per Tube BID   pantoprazole (PROTONIX) IV  40 mg Intravenous Q24H   prazosin  4 mg Per Tube QHS   sodium chloride flush  10-40 mL Intracatheter Q12H   Continuous Infusions:  sodium chloride Stopped (06/22/20 1614)   feeding supplement (NEPRO CARB STEADY) 1,000 mL (06/22/20 2030)   PRN Meds:.sodium chloride, heparin sodium (porcine), heparin sodium (porcine), hydrALAZINE, labetalol, ondansetron **OR** ondansetron (ZOFRAN) IV, prazosin, sodium chloride flush    REVIEW OF SYSTEMS  PATIENT IS UNABLE TO PROVIDE COMPLETE REVIEW OF SYSTEMS DUE TO SEVERE CRITICAL ILLNESS AND TOXIC METABOLIC ENCEPHALOPATHY on MV   PHYSICAL EXAMINATION:  GENERAL: no apparent distress, older then stated age.  HEAD: Normocephalic, atraumatic. EYES: Pupils equal, round, reactive to light.  No scleral icterus. MOUTH: Dry oral mucosa NECK: Supple. Trachea midline dialysis catheter on left neck wrapped without exudation or oozing of blood with clean  dressing PULMONARY: rhonchi at left lung CARDIOVASCULAR: S1 and S2. Regular rate and rhythm. No murmurs, rubs, or gallops. GASTROINTESTINAL: Soft, non-distended. Positive bowel sounds. MUSCULOSKELETAL: No swelling, clubbing, or edema. NEUROLOGIC: Nonverbal randomly moves extremities but not purposeful, sometimes does say words and appears to speak purposefully overall unclear mentation SKIN:intact,warm,dry  ASSESSMENT AND PLAN  ACUTE Hypoxic and Hypercapnic Respiratory Failure Due to pneumonia-present on admission due to staph species versus possible Candida as evidenced by positive tracheal aspirate and bronchoalveolar lavage   -Status postextubation with very slow neurologic recovery still unable to communicate purposefully      SEVERE COPD EXACERBATION -continue IV steroids -continue NEB THERAPY     ACUTE KIDNEY INJURY/Renal Failure -continue Foley Catheter-assess need -Avoid nephrotoxic agents -Follow urine output, BMP -Ensure adequate renal perfusion, optimize oxygenation -Renal dose medications - HD today per Dr. Holley Raring     Acute toxic metabolic encephalopathy Goal RASS -0 Precedex  SEPTIC SHOCK -resolved -use vasopressors to keep MAP>65 as needed -follow ABG and LA -follow up cultures -continueABX   INFECTIOUS DISEASE -continue antibiotics as prescribed -follow up cultures -follow up ID recs   MRSA NEG RVP NEGATIVE COVID NEG INF AB NEG SPUTUM CX NG AFB SMEAR NEG HIV NEG   ENDO - ICU hypoglycemic\Hyperglycemia protocol -check FSBS per protocol     GI GI PROPHYLAXIS as indicated   NUTRITIONAL STATUS DIET-->TF's as tolerated Constipation protocol as indicated     ELECTROLYTES -follow labs as needed -replace as needed -pharmacy consultation and following   MAR reviewed with clinical pharmacist     Best practice (right click and "Reselect all SmartList Selections" daily)  Diet:  NPO Pain/Anxiety/Delirium protocol (if indicated): Yes  (RASS goal -2) VAP protocol (if indicated): Yes DVT prophylaxis: Subcutaneous Heparin GI prophylaxis: H2B Glucose control:  SSI Yes Central venous access:  N/A Arterial line:  N/A Foley:  N/A Mobility:  bed rest  PT consulted: N/A Code Status:  full code Disposition: ICU   Labs   CBC: Recent Labs  Lab 06/19/20 0448 06/20/20 0417 06/21/20 0606 06/22/20 0542 06/23/20 0454  WBC 7.2 10.7* 8.5 7.0 8.1  NEUTROABS 5.9 9.0* 7.1 5.4 6.5  HGB 8.1* 8.9* 9.6* 9.1* 8.8*  HCT 23.5* 27.2* 28.9* 26.6* 27.2*  MCV 86.7 89.2 88.7 88.4 91.0  PLT 309 323 322 254 256     Basic Metabolic Panel: Recent Labs  Lab 06/19/20 0448 06/20/20 0417 06/21/20 0606 06/22/20 0542 06/23/20 0454  NA 148* 144 149* 142 146*  K 5.3* 4.4 4.3 3.7 3.5  CL 110 106 110 105 109  CO2 23 22 21* 27 27  GLUCOSE 179* 119* 134* 170* 175*  BUN 174* 112* 131* 91* 126*  CREATININE 5.87* 4.43* 5.19* 3.70* 4.52*  CALCIUM 8.5* 8.5* 8.9 8.7* 9.3  MG 2.7* 2.5* 2.8* 2.2 2.5*  PHOS 12.8* 10.6* 11.9* 9.3* 8.8*    GFR: Estimated Creatinine Clearance: 17.6 mL/min (A) (by C-G formula based on SCr of 4.52 mg/dL (H)). Recent Labs  Lab 06/20/20 0417 06/21/20 0606 06/22/20 0542 06/23/20 0454  WBC 10.7* 8.5 7.0 8.1     Liver Function Tests: Recent Labs  Lab 06/22/20 0542  AST 35  ALT 38  ALKPHOS 66  BILITOT 0.8  PROT 6.2*  ALBUMIN 2.6*    Recent Labs  Lab 06/20/20 1516  LIPASE 40  AMYLASE 61    No results for input(s): AMMONIA in the last 168 hours.  ABG    Component Value Date/Time   PHART 7.33 (L) 06/08/2020 0500   PCO2ART 40 06/08/2020 0500   PO2ART 99 06/08/2020 0500   HCO3 21.1 06/08/2020 0500   ACIDBASEDEF 4.5 (H) 06/08/2020 0500   O2SAT 97.2 06/08/2020 0500      Coagulation Profile: No results for input(s): INR, PROTIME in the last 168 hours.  Cardiac Enzymes: No results for input(s): CKTOTAL, CKMB, CKMBINDEX, TROPONINI in the last 168 hours.   HbA1C: Hgb A1c MFr Bld   Date/Time Value Ref Range Status  06/04/2020 09:48 PM 7.4 (H) 4.8 - 5.6 % Final    Comment:    (NOTE)         Prediabetes: 5.7 - 6.4         Diabetes: >6.4         Glycemic control for adults with diabetes: <7.0     CBG: Recent Labs  Lab 06/22/20 1920 06/22/20 2329 06/23/20 0321 06/23/20 0758 06/23/20 1127  GLUCAP 206* 199*  206* 187* 174*     Allergies No Known Allergies     The patient is critically ill with multiple organ systems failure and requires high complexity decision making for assessment and support, frequent evaluation and titration of therapies, application of advanced monitoring technologies and extensive interpretation of multiple databases. Critical Care Time devoted to patient care services described in this note is 35 minutes.   Ottie Glazier, M.D.  [facility] [place]    *This note was dictated using voice recognition software/Dragon.  Despite best efforts to proofread, errors can occur which can change the meaning.  Any change was purely unintentional.

## 2020-06-24 LAB — COMPREHENSIVE METABOLIC PANEL
ALT: 72 U/L — ABNORMAL HIGH (ref 0–44)
AST: 46 U/L — ABNORMAL HIGH (ref 15–41)
Albumin: 2.9 g/dL — ABNORMAL LOW (ref 3.5–5.0)
Alkaline Phosphatase: 79 U/L (ref 38–126)
Anion gap: 8 (ref 5–15)
BUN: 130 mg/dL — ABNORMAL HIGH (ref 8–23)
CO2: 28 mmol/L (ref 22–32)
Calcium: 9.7 mg/dL (ref 8.9–10.3)
Chloride: 110 mmol/L (ref 98–111)
Creatinine, Ser: 4.94 mg/dL — ABNORMAL HIGH (ref 0.61–1.24)
GFR, Estimated: 13 mL/min — ABNORMAL LOW (ref >60)
Glucose, Bld: 251 mg/dL — ABNORMAL HIGH (ref 70–99)
Potassium: 3.8 mmol/L (ref 3.5–5.1)
Sodium: 146 mmol/L — ABNORMAL HIGH (ref 135–145)
Total Bilirubin: 0.7 mg/dL (ref 0.3–1.2)
Total Protein: 6.3 g/dL — ABNORMAL LOW (ref 6.5–8.1)

## 2020-06-24 LAB — CBC WITH DIFFERENTIAL/PLATELET
Abs Immature Granulocytes: 0.02 10*3/uL (ref 0.00–0.07)
Basophils Absolute: 0 10*3/uL (ref 0.0–0.1)
Basophils Relative: 0 %
Eosinophils Absolute: 0 10*3/uL (ref 0.0–0.5)
Eosinophils Relative: 0 %
HCT: 27 % — ABNORMAL LOW (ref 39.0–52.0)
Hemoglobin: 8.9 g/dL — ABNORMAL LOW (ref 13.0–17.0)
Immature Granulocytes: 0 %
Lymphocytes Relative: 15 %
Lymphs Abs: 1.1 10*3/uL (ref 0.7–4.0)
MCH: 29.4 pg (ref 26.0–34.0)
MCHC: 33 g/dL (ref 30.0–36.0)
MCV: 89.1 fL (ref 80.0–100.0)
Monocytes Absolute: 0.5 10*3/uL (ref 0.1–1.0)
Monocytes Relative: 6 %
Neutro Abs: 5.5 10*3/uL (ref 1.7–7.7)
Neutrophils Relative %: 79 %
Platelets: 245 10*3/uL (ref 150–400)
RBC: 3.03 MIL/uL — ABNORMAL LOW (ref 4.22–5.81)
RDW: 15.1 % (ref 11.5–15.5)
WBC: 7.1 10*3/uL (ref 4.0–10.5)
nRBC: 0 % (ref 0.0–0.2)

## 2020-06-24 LAB — GLUCOSE, CAPILLARY
Glucose-Capillary: 198 mg/dL — ABNORMAL HIGH (ref 70–99)
Glucose-Capillary: 166 mg/dL — ABNORMAL HIGH (ref 70–99)
Glucose-Capillary: 186 mg/dL — ABNORMAL HIGH (ref 70–99)
Glucose-Capillary: 165 mg/dL — ABNORMAL HIGH (ref 70–99)
Glucose-Capillary: 123 mg/dL — ABNORMAL HIGH (ref 70–99)
Glucose-Capillary: 168 mg/dL — ABNORMAL HIGH (ref 70–99)

## 2020-06-24 LAB — MAGNESIUM: Magnesium: 2.5 mg/dL — ABNORMAL HIGH (ref 1.7–2.4)

## 2020-06-24 LAB — PHOSPHORUS: Phosphorus: 8 mg/dL — ABNORMAL HIGH (ref 2.5–4.6)

## 2020-06-24 NOTE — Progress Notes (Signed)
60 y/o M w/ PMH of HTN, PTSD, DM, neurogenic bladder, migraines who found to have pneumonia. Pt is s/p intubation, ventilation & extubation. S/p HD as per nephro. Hospitalist service will take over care tomorrow.  This is a non-billable note

## 2020-06-24 NOTE — Progress Notes (Signed)
NAME:  Wyatt Ball., MRN:  [id number], DOB:  1961-01-05, LOS: 62 ADMISSION DATE:  06/04/2020 60 y.o. male with medical history significant for HTN, PTSD, diabetes, migraines, GERD, neurogenic bladder secondary to remote history of transverse myelitis, who self catheterizes and has frequent UTIs as well as chronic back pain on chronic opiates who at baseline is independent - brought to the ER with a 3-day history of altered mental status.   + cough, shortness of breath and fever as well as vomiting and diarrhea and started becoming increasingly confused in the 24 hours prior to arrival.       Unable to get any meaningful history from patient.   ED course: On arrival, temperature 99.3, BP 167/67, pulse 99, respirations 18 with O2 sat 96% on room air.  Blood work significant for leukocytosis of 13,600 with lactic acid of 1.2.  Ammonia level normal at 15, EtOH less than 10.  Sodium 130, potassium 3.2, glucose 249 creatinine 1.06.  Urinalysis with 80 ketones and rare bacteria.  UDS positive for opiates and tricyclic's.  COVID and flu negative       Imaging:   CT chest abdomen and pelvis without contrast consistent with pneumonia.  Multiple nodules left upper lobe likely infectious or inflammatory with follow-up imaging recommended to document resolution.   Cardiomegaly with trace pericardial effusion   Patient was started on sepsis fluids and broad-spectrum antibiotics initially for sepsis of unknown source.       Significant Hospital Events: Including procedures, antibiotic start and stop dates in addition to other pertinent events   5/31 admitted for sepsis present on admission for Left lung pneumonia 6/1 progressive confusion and hypoxia 6/2 transferred to ICU, INTUBATED, S/P BRONCH 6/3 high fevers, remains ill, SELF EXTUBATED 6/3 emergently re-intubated 6/5 failure to wean from v ent 6/6 failure to wean from vent, VASC CATH PLACED 6/7 HD started remains critically ill 6/8  remains on vent 6/9 waiting for family for SAT 06/17/2020-patient had dialysis today without acute events.  He was unable to pass SBT even post HD due to Care plan reviewed with infectious disease today.  06/18/20- patient still unable to follow verbal communication.  Repeat SBT today with goal for extubation   06/19/20-  patient is improved, will attempt to liberate from MV today post dialysis and SBT.  06/20/20- patient is improved, he is difficult to engage in conversation, I was able to capture his attention with negotiating food items and he did speak with me some. Unclear if there is psychiatric disease. 06/21/20- patient on room air now, he is non-communicative with encephalopathy, plan for additional dialysis and official swallow evaluation.  06/22/20- patient is improved today with ability to move hands sometimes independently.  His metabolic encephalopathy is very slow to improve, will obtain neurology evaluation.  06/23/20- patient imporved with plan for TR[facility] transfer.  S/p neuro evalaution for metabolic encephalopathy  0/17/49- patient had HD , he did not make through whole session.  He had accelerated HTN and labetalol was delivered with prn hydralazine.  He is still with severe encephalopathy unable to eat orally.  He has SLP eval again today, very concerning mentation.     CBC    Component Value Date/Time   WBC 7.1 06/24/2020 0504   RBC 3.03 (L) 06/24/2020 0504   HGB 8.9 (L) 06/24/2020 0504   HCT 27.0 (L) 06/24/2020 0504   PLT 245 06/24/2020 0504   MCV 89.1 06/24/2020 0504   MCH 29.4 06/24/2020 0504  MCHC 33.0 06/24/2020 0504   RDW 15.1 06/24/2020 0504   LYMPHSABS 1.1 06/24/2020 0504   MONOABS 0.5 06/24/2020 0504   EOSABS 0.0 06/24/2020 0504   BASOSABS 0.0 06/24/2020 0504     BMP Latest Ref Rng & Units 06/24/2020 06/23/2020 06/22/2020  Glucose 70 - 99 mg/dL 251(H) 175(H) 170(H)  BUN 8 - 23 mg/dL 130(H) 126(H) 91(H)  Creatinine 0.61 - 1.24 mg/dL 4.94(H) 4.52(H) 3.70(H)   Sodium 135 - 145 mmol/L 146(H) 146(H) 142  Potassium 3.5 - 5.1 mmol/L 3.8 3.5 3.7  Chloride 98 - 111 mmol/L 110 109 105  CO2 22 - 32 mmol/L _0 Calcium 8.9 - 10.3 mg/dL 9.7 9.3 8.7(L)       Objective   Blood pressure (!) 178/81, pulse (!) 108, temperature 99.5 F (37.5 C), resp. rate 18, height 5' 5.98" (1.676 m), weight 85.2 kg, SpO2 100 %.        Intake/Output Summary (Last 24 hours) at 06/24/2020 1419 Last data filed at 06/24/2020 1341 Gross per 24 hour  Intake 1089 ml  Output 1200 ml  Net -111 ml    Filed Weights   06/19/20 0500 06/20/20 0500 06/21/20 0530  Weight: 98.1 kg 98.1 kg 85.2 kg   Scheduled Meds:  amLODipine  5 mg Per Tube Daily   chlorhexidine gluconate (MEDLINE KIT)  15 mL Mouth Rinse BID   Chlorhexidine Gluconate Cloth  6 each Topical Daily   cloNIDine  0.3 mg Transdermal Weekly   feeding supplement (PROSource TF)  45 mL Per Tube Daily   free water  100 mL Per Tube Q4H   heparin injection (subcutaneous)  5,000 Units Subcutaneous Q8H   insulin aspart  0-20 Units Subcutaneous Q4H   insulin aspart  3 Units Subcutaneous Q4H   insulin glargine  20 Units Subcutaneous Daily   multivitamin  1 tablet Per Tube QHS   nutrition supplement (JUVEN)  1 packet Per Tube BID   pantoprazole (PROTONIX) IV  40 mg Intravenous Q24H   prazosin  4 mg Per Tube QHS   sodium chloride flush  10-40 mL Intracatheter Q12H   Continuous Infusions:  sodium chloride Stopped (06/22/20 1614)   feeding supplement (NEPRO CARB STEADY) 1,000 mL (06/23/20 1718)   PRN Meds:.sodium chloride, heparin sodium (porcine), heparin sodium (porcine), hydrALAZINE, labetalol, ondansetron **OR** ondansetron (ZOFRAN) IV, oxyCODONE-acetaminophen, prazosin, sodium chloride flush    REVIEW OF SYSTEMS  PATIENT IS UNABLE TO PROVIDE COMPLETE REVIEW OF SYSTEMS DUE TO SEVERE CRITICAL ILLNESS AND TOXIC METABOLIC ENCEPHALOPATHY on MV   PHYSICAL EXAMINATION:  GENERAL: no apparent distress, older then  stated age.  HEAD: Normocephalic, atraumatic. EYES: Pupils equal, round, reactive to light.  No scleral icterus. MOUTH: Dry oral mucosa NECK: Supple. Trachea midline dialysis catheter on left neck wrapped without exudation or oozing of blood with clean dressing PULMONARY: rhonchi at left lung CARDIOVASCULAR: S1 and S2. Regular rate and rhythm. No murmurs, rubs, or gallops. GASTROINTESTINAL: Soft, non-distended. Positive bowel sounds. MUSCULOSKELETAL: No swelling, clubbing, or edema. NEUROLOGIC: Nonverbal randomly moves extremities but not purposeful, sometimes does say words and appears to speak purposefully overall unclear mentation SKIN:intact,warm,dry  ASSESSMENT AND PLAN  ACUTE Hypoxic and Hypercapnic Respiratory Failure Due to pneumonia-present on admission due to staph species versus possible Candida as evidenced by positive tracheal aspirate and bronchoalveolar lavage   -Status postextubation with very slow neurologic recovery still unable to communicate purposefully      SEVERE COPD EXACERBATION -continue IV steroids -continue NEB THERAPY  ACUTE KIDNEY INJURY/Renal Failure -continue Foley Catheter-assess need -Avoid nephrotoxic agents -Follow urine output, BMP -Ensure adequate renal perfusion, optimize oxygenation -Renal dose medications - HD today per Dr. Holley Raring     Acute toxic metabolic encephalopathy Goal RASS -0 Precedex     SEPTIC SHOCK -resolved -use vasopressors to keep MAP>65 as needed -follow ABG and LA -follow up cultures -continueABX   INFECTIOUS DISEASE -continue antibiotics as prescribed -follow up cultures -follow up ID recs   MRSA NEG RVP NEGATIVE COVID NEG INF AB NEG SPUTUM CX NG AFB SMEAR NEG HIV NEG   ENDO - ICU hypoglycemic\Hyperglycemia protocol -check FSBS per protocol     GI GI PROPHYLAXIS as indicated   NUTRITIONAL STATUS DIET-->TF's as tolerated Constipation protocol as indicated     ELECTROLYTES -follow labs  as needed -replace as needed -pharmacy consultation and following   MAR reviewed with clinical pharmacist     Best practice (right click and "Reselect all SmartList Selections" daily)  Diet:  NPO Pain/Anxiety/Delirium protocol (if indicated): Yes (RASS goal -2) VAP protocol (if indicated): Yes DVT prophylaxis: Subcutaneous Heparin GI prophylaxis: H2B Glucose control:  SSI Yes Central venous access:  N/A Arterial line:  N/A Foley:  N/A Mobility:  bed rest  PT consulted: N/A Code Status:  full code Disposition: ICU   Labs   CBC: Recent Labs  Lab 06/20/20 0417 06/21/20 0606 06/22/20 0542 06/23/20 0454 06/24/20 0504  WBC 10.7* 8.5 7.0 8.1 7.1  NEUTROABS 9.0* 7.1 5.4 6.5 5.5  HGB 8.9* 9.6* 9.1* 8.8* 8.9*  HCT 27.2* 28.9* 26.6* 27.2* 27.0*  MCV 89.2 88.7 88.4 91.0 89.1  PLT 323 322 254 256 245     Basic Metabolic Panel: Recent Labs  Lab 06/20/20 0417 06/21/20 0606 06/22/20 0542 06/23/20 0454 06/24/20 0504  NA 144 149* 142 146* 146*  K 4.4 4.3 3.7 3.5 3.8  CL 106 110 105 109 110  CO2 22 21* _0 GLUCOSE 119* 134* 170* 175* 251*  BUN 112* 131* 91* 126* 130*  CREATININE 4.43* 5.19* 3.70* 4.52* 4.94*  CALCIUM 8.5* 8.9 8.7* 9.3 9.7  MG 2.5* 2.8* 2.2 2.5* 2.5*  PHOS 10.6* 11.9* 9.3* 8.8* 8.0*    GFR: Estimated Creatinine Clearance: 16.1 mL/min (A) (by C-G formula based on SCr of 4.94 mg/dL (H)). Recent Labs  Lab 06/21/20 0606 06/22/20 0542 06/23/20 0454 06/24/20 0504  WBC 8.5 7.0 8.1 7.1     Liver Function Tests: Recent Labs  Lab 06/22/20 0542 06/24/20 0504  AST 35 46*  ALT 38 72*  ALKPHOS 66 79  BILITOT 0.8 0.7  PROT 6.2* 6.3*  ALBUMIN 2.6* 2.9*    Recent Labs  Lab 06/20/20 1516  LIPASE 40  AMYLASE 61    No results for input(s): AMMONIA in the last 168 hours.  ABG    Component Value Date/Time   PHART 7.33 (L) 06/08/2020 0500   PCO2ART 40 06/08/2020 0500   PO2ART 99 06/08/2020 0500   HCO3 21.1 06/08/2020 0500    ACIDBASEDEF 4.5 (H) 06/08/2020 0500   O2SAT 97.2 06/08/2020 0500      Coagulation Profile: No results for input(s): INR, PROTIME in the last 168 hours.  Cardiac Enzymes: No results for input(s): CKTOTAL, CKMB, CKMBINDEX, TROPONINI in the last 168 hours.   HbA1C: Hgb A1c MFr Bld  Date/Time Value Ref Range Status  06/04/2020 09:48 PM 7.4 (H) 4.8 - 5.6 % Final    Comment:    (NOTE)  Prediabetes: 5.7 - 6.4         Diabetes: >6.4         Glycemic control for adults with diabetes: <7.0     CBG: Recent Labs  Lab 06/23/20 1929 06/23/20 2333 06/24/20 0446 06/24/20 0812 06/24/20 1337  GLUCAP 181* 138* 198* 166* 186*     Allergies No Known Allergies     The patient is critically ill with multiple organ systems failure and requires high complexity decision making for assessment and support, frequent evaluation and titration of therapies, application of advanced monitoring technologies and extensive interpretation of multiple databases. Critical Care Time devoted to patient care services described in this note is 35 minutes.   Ottie Glazier, M.D.  [facility] [place]    *This note was dictated using voice recognition software/Dragon.  Despite best efforts to proofread, errors can occur which can change the meaning.  Any change was purely unintentional.

## 2020-06-24 NOTE — Progress Notes (Signed)
Speech Language Pathology Treatment: Dysphagia  Patient Details Name: Wyatt Ball. MRN: [id number] DOB: [birth year] Today's Date: 06/24/2020 Time: 8850-2774 SLP Time Calculation (min) (ACUTE ONLY): 40 min  Assessment / Plan / Recommendation Clinical Impression   Pt seen for ongoing assessment of status and readiness for trials of po's; safety w/ oral diet. Pt had HD this AM but he did not make through whole session d/t accelerated HTN and RR, even noted at this session; NSG addressing w/ meds. Pt continues to present w/ Encephalopathy and confusion requiring MAX verbal/tactile/visual cues during tasks -- significantly inconsistency follow through despite cues. Pt continues to present w/ severe encephalopathy c/b decreased attention to task, decreased oral awareness and engagement w/ oral prep tasks, inability to engage w/ functional tasks and problem-solve to take food/drink from spoon or drink from a straw, and inability to follow basic simple directions. When offered trials of ice chips, Nectar liquids, and puree via TSP, pt demonstrated significant Cognitive disengagement often letting the boluses lay anteriorly in mouth, further open mouth allowing anterior leakage, or did not open mouth to accept po's. If any bolus residue remained on his lips from po trials attempted, no lingual-oral prep movements or licking noted. One pharyngeal swallow was appreciated post trial of ice chip(x1); delayed Coughing noted w/ post trial of Nectar liquid. Pt did not open his mouth for the puree trial.   Pt often turned head to the side w/ increased movements at times. He did not follow any instructions; movements and responses were non-purposeful suggestive of a [place] Level 2: ("Generalized response - Inconsistent, non-purposeful, nonspecific response to all stimuli. Responses may be delayed").  Pt was given Max verbal/tactile/visual cues and encouragement during po tasks. Pt did not give verbalizations  during session but noted NSG notes that pt has yelled out. Pt did not follow basic commands, nor engage w/ this SLP today.   Given pt's currently declined mentation and high risk for aspiration/choking w/ any oral intake, recommend pt continue NPO status w/ frequent oral care for hygiene and stimulation of swallowing. ST to follow for PO readiness; trials at bedside ongoing. Recommend Palliative Care consult for overall GOC; Dietician following for enteral support via NGT - discussion of PEG support d/t pt's sustained, significantly declined, Cognitive status currently. MD and NSG updated on above and agreed.      HPI HPI: Wyatt Ball. is a 60 y.o. 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 CT chest abdomen and pelvis without contrast consistent with pneumonia. Multiple nodules left upper lobe likely infectious or inflammatory with follow-up imaging recommended to document resolution. Cardiomegaly with trace pericardial effusion.  Pt had a decline in status and was transferred to CCU on 6//2022 and orally intubated. He extubated/reintubated during this time until finally extubated on 06/19/2020. He is on [place] O2 support now, and though verbal yesterday somewhat, he is non-communicative with encephalopathy currently. MD unsure if psychiatric in nature and has consulted Neurology.      SLP Plan  Continue with current plan of care       Recommendations  Diet recommendations: NPO Medication Administration: Via alternative means  General recommendations:  (Palliative Care consult for GOC; Dietician f/u) Oral Care  Recommendations: Oral care QID;Staff/trained caregiver to provide oral care Follow up Recommendations: Skilled Nursing facility (TBD) SLP Visit Diagnosis: Dysphagia, oropharyngeal phase (R13.12) (significant Cognitive decline and impact) Plan: Continue with current plan of care       GO                 Wyatt Som, MS, CCC-SLP Speech Language Pathologist Rehab Services [phone] [facility] 06/24/2020, 5:17 PM

## 2020-06-24 NOTE — Procedures (Signed)
Will do eeg today when patient out of dialysis

## 2020-06-24 NOTE — Consult Note (Signed)
WOC Nurse Consult Note: Patient receiving care in [facility]. Reason for Consult: unstageable sacral wound Wound type: evolving DTPI to coccyx Pressure Injury POA: Yes Measurement: 1.8 cm x 0.8 cm Wound bed: slightly darkened with maroon hue Drainage (amount, consistency, odor) none Periwound: intact Dressing procedure/placement/frequency: Place a very small (2 x 2 gauze) moistened with saline, and folded to fit the wound at the coccyx. Cover with a foam dressing. Change each shift and prn soilage. Monitor the wound area(s) for worsening of condition such as: Signs/symptoms of infection,  Increase in size,  Development of or worsening of odor, Development of pain, or increased pain at the affected locations.  Notify the medical team if any of these develop. WOC nurse will not follow at this time.  Please re-consult the WOC team if needed.  Helmut Muster, RN, MSN, CWOCN, CNS-BC, pager [phone]

## 2020-06-24 NOTE — Progress Notes (Signed)
OT Cancellation Note  Patient Details Name: Keyion Knack. MRN: [id number] DOB: 11-22-1960   Cancelled Treatment:    Reason Eval/Treat Not Completed: Patient at procedure or test/ unavailable Pt OTF to HD at this time. Will complete OT Tx at later date/time as able/pt available. Thank you.  Rejeana Brock, MS, OTR/L ascom [phone] 06/24/20, 11:20 AM

## 2020-06-24 NOTE — Progress Notes (Signed)
Nutrition Follow-up  DOCUMENTATION CODES:   Obesity unspecified  INTERVENTION:   Continue Nepro 1.8 _0 /hr + ProSource TF 38m daily via tube   Free water flushes 1027mq 4 hours  Regimen provides 2416kcal/day, 118g/day protein and 155969may free water   Rena-vit daily via tube   Juven Fruit Punch BID via tube, each serving provides 95kcal and 2.5g of protein (amino acids glutamine and arginine)  NUTRITION DIAGNOSIS:   Inadequate oral intake related to inability to eat as evidenced by NPO status.  GOAL:   Patient will meet greater than or equal to 90% of their needs -met with tube feeds   MONITOR:   Labs, Weight trends, Diet advancement, Skin, I & O's  ASSESSMENT:   60 37o. male with h/o hypertension, PTSD, diabetes, migraines, GERD, neurogenic bladder secondary to remote history of transverse myelitis, requires self-catheterization and has frequent UTIs and opioids for chronic pain who is admitted on 06/04/2020 for CAP and sepsis   Pt s/p HD initiation 6/6   NGT replaced 6/17; pt tolerating tube feeds at goal rate. Pt continues on HD. Per chart, pt has remained fairly weight stable since admit. No new weight since 6/17; pt is ordered for daily weights. If family wishes for full aggressive care, would consider G-tube placement.   Medications reviewed and include: heparin, insulin, rena-vit, protonix, juven   Labs reviewed: Na 146(H), K 3.8 wnl, BUN 130(H), creat 4.94(H), P 8.0(H), Mg 2.5(H) Hgb 8.9(L), Hct 27.0(L) cbgs- 198, 166, 186 x 24 hrs  UOP- 1325m53mDiet Order:    Diet Order             Diet NPO time specified  Diet effective now                  EDUCATION NEEDS:   No education needs have been identified at this time  Skin:  Skin Assessment: Reviewed RN Assessment (sacral wound 1.8 cm x 0.8 cm)  Last BM:  6/19- type 6  Height:   Ht Readings from Last 1 Encounters:  06/18/20 5' 5.98" (1.676 m)    Weight:   Wt Readings from Last 1  Encounters:  06/21/20 85.2 kg    Ideal Body Weight:  64.5 kg  BMI:  Body mass index is 30.33 kg/m.  Estimated Nutritional Needs:   Kcal:  2100-2400kcal/day  Protein:  105-120g/day  Fluid:  1.9-2.2L/day  CaseKoleen Distance RD, LDN Please refer to [facility] and/or RD on-call/weekend/after hours pager

## 2020-06-24 NOTE — Progress Notes (Signed)
[facility]  Pt completed 1 hour 4 minutes of 2.5 hrs ordered. Nonfuctional hd catheter. Yet gain of 300 ml . Nephrologist aware.  Remus Blake

## 2020-06-24 NOTE — Progress Notes (Signed)
   06/24/20 1646  Assess: MEWS Score  Temp 98.9 F (37.2 C)  BP (!) 153/82  Pulse Rate (!) 116  Resp 20  SpO2 98 %  O2 Device Room Air  Assess: MEWS Score  MEWS Temp 0  MEWS Systolic 0  MEWS Pulse 2  MEWS RR 0  MEWS LOC 0  MEWS Score 2  MEWS Score Color Yellow  Assess: if the MEWS score is Yellow or Red  Were vital signs taken at a resting state? Yes  Focused Assessment No change from prior assessment  Early Detection of Sepsis Score *See Row Information* Low  MEWS guidelines implemented *See Row Information* No, previously yellow, continue vital signs every 4 hours  Treat  MEWS Interventions Other (Comment) (continue monitor)  Document  Patient Outcome Other (Comment)  Progress note created (see row info) Yes

## 2020-06-24 NOTE — Progress Notes (Signed)
[facility]  ROUNDING NOTE   Subjective:   Seen and examined on hemodialysis treatment. Not cooperative. Noncommunicative.   UOP 1361m.     HEMODIALYSIS FLOWSHEET:  Blood Flow Rate (mL/min): 200 mL/min Arterial Pressure (mmHg): -130 mmHg Venous Pressure (mmHg): 110 mmHg Transmembrane Pressure (mmHg): 50 mmHg Ultrafiltration Rate (mL/min): 500 mL/min Dialysate Flow Rate (mL/min): 500 ml/min Conductivity: Machine : 14 Conductivity: Machine : 14 Dialysis Fluid Bolus: Normal Saline Bolus Amount (mL): 200 mL Dialysate Change: 4K   Objective:  Vital signs in last 24 hours:  Temp:  [98.2 F (36.8 C)-99 F (37.2 C)] 98.5 F (36.9 C) (06/20 0811) Pulse Rate:  [98-124] 103 (06/20 0957) Resp:  [13-20] 20 (06/20 0811) BP: (160-187)/(79-84) 171/82 (06/20 0957) SpO2:  [96 %-100 %] 96 % (06/20 0811)  Weight change:  Filed Weights   06/19/20 0500 06/20/20 0500 06/21/20 0530  Weight: 98.1 kg 98.1 kg 85.2 kg    Intake/Output: I/O last 3 completed shifts: In: 2097.3 [I.V.:10; NG/GT:2087.3] Out: 2375 [Urine:2375]   Intake/Output this shift:  No intake/output data recorded.  Physical Exam: General: NAD, laying in bed  Head: Normocephalic, atraumatic. Moist oral mucosal membranes  Eyes: Anicteric, PERRL  Neck: Supple, trachea midline  Lungs:  Clear to auscultation  Heart: Regular rate and rhythm  Abdomen:  Soft, nontender  Extremities:  no peripheral edema.  Neurologic: Moving all four extremities. Not responsive to verbal stimuli  Skin: No lesions  Access: Left IJ temp HD catheter 6/6  GU Foley    Basic Metabolic Panel: Recent Labs  Lab 06/20/20 0417 06/21/20 0606 06/22/20 0542 06/23/20 0454 06/24/20 0504  NA 144 149* 142 146* 146*  K 4.4 4.3 3.7 3.5 3.8  CL 106 110 105 109 110  CO2 22 21* _0 GLUCOSE 119* 134* 170* 175* 251*  BUN 112* 131* 91* 126* 130*  CREATININE 4.43* 5.19* 3.70* 4.52* 4.94*  CALCIUM 8.5* 8.9 8.7* 9.3 9.7  MG 2.5*  2.8* 2.2 2.5* 2.5*  PHOS 10.6* 11.9* 9.3* 8.8* 8.0*     Liver Function Tests: Recent Labs  Lab 06/22/20 0542 06/24/20 0504  AST 35 46*  ALT 38 72*  ALKPHOS 66 79  BILITOT 0.8 0.7  PROT 6.2* 6.3*  ALBUMIN 2.6* 2.9*    Recent Labs  Lab 06/20/20 1516  LIPASE 40  AMYLASE 61    No results for input(s): AMMONIA in the last 168 hours.  CBC: Recent Labs  Lab 06/20/20 0417 06/21/20 0606 06/22/20 0542 06/23/20 0454 06/24/20 0504  WBC 10.7* 8.5 7.0 8.1 7.1  NEUTROABS 9.0* 7.1 5.4 6.5 5.5  HGB 8.9* 9.6* 9.1* 8.8* 8.9*  HCT 27.2* 28.9* 26.6* 27.2* 27.0*  MCV 89.2 88.7 88.4 91.0 89.1  PLT 323 322 254 256 245     Cardiac Enzymes: No results for input(s): CKTOTAL, CKMB, CKMBINDEX, TROPONINI in the last 168 hours.  BNP: Invalid input(s): POCBNP  CBG: Recent Labs  Lab 06/23/20 1632 06/23/20 1929 06/23/20 2333 06/24/20 0446 06/24/20 0812  GLUCAP 197* 181* 138* 198* 166*     Microbiology: Results for orders placed or performed during the hospital encounter of 06/04/20  Blood culture (single)     Status: Abnormal   Collection Time: 06/04/20  5:18 PM   Specimen: BLOOD  Result Value Ref Range Status   Specimen Description   Final    BLOOD BLOOD RIGHT HAND Performed at [facility] [address] [place] [address]   Special Requests  Final    BOTTLES DRAWN AEROBIC AND ANAEROBIC Blood Culture adequate volume Performed at [facility], [address] [place], [place] [address]    Culture  Setup Time   Final    GRAM POSITIVE COCCI IN BOTH AEROBIC AND ANAEROBIC BOTTLES CRITICAL RESULT CALLED TO, READ BACK BY AND VERIFIED WITH: Elvera Maria [id number] 06/06/2020 DLB Performed at [facility], [address] [place], [place] [address]    Culture (A)  Final    STAPHYLOCOCCUS EPIDERMIDIS THE SIGNIFICANCE OF ISOLATING THIS ORGANISM FROM A SINGLE SET OF BLOOD CULTURES WHEN MULTIPLE SETS ARE DRAWN IS UNCERTAIN. PLEASE NOTIFY THE  MICROBIOLOGY DEPARTMENT WITHIN ONE WEEK IF SPECIATION AND SENSITIVITIES ARE REQUIRED. MICROCOCCUS SPECIES Standardized susceptibility testing for this organism is not available. KOCURIA RHIZOPHILIA Performed at [facility], [place] [address] [place], [place] [address]    Report Status 06/10/2020 FINAL  Final  Resp Panel by RT-PCR (Flu A&B, Covid) Nasopharyngeal Swab     Status: None   Collection Time: 06/04/20  5:18 PM   Specimen: Nasopharyngeal Swab; Nasopharyngeal(NP) swabs in vial transport medium  Result Value Ref Range Status   SARS Coronavirus 2 by RT PCR NEGATIVE NEGATIVE Final    Comment: (NOTE) SARS-CoV-2 target nucleic acids are NOT DETECTED.  The SARS-CoV-2 RNA is generally detectable in upper respiratory specimens during the acute phase of infection. The lowest concentration of SARS-CoV-2 viral copies this assay can detect is 138 copies/mL. A negative result does not preclude SARS-Cov-2 infection and should not be used as the sole basis for treatment or other patient management decisions. A negative result may occur with  improper specimen collection/handling, submission of specimen other than nasopharyngeal swab, presence of viral mutation(s) within the areas targeted by this assay, and inadequate number of viral copies(<138 copies/mL). A negative result must be combined with clinical observations, patient history, and epidemiological information. The expected result is Negative.  Fact Sheet for Patients:  EntrepreneurPulse.com.au  Fact Sheet for Healthcare Providers:  IncredibleEmployment.be  This test is no t yet approved or cleared by the [place] FDA and  has been authorized for detection and/or diagnosis of SARS-CoV-2 by FDA under an Emergency Use Authorization (EUA). This EUA will remain  in effect (meaning this test can be used) for the duration of the COVID-19 declaration under Section 564(b)(1) of the Act,  21 U.S.C.section 360bbb-3(b)(1), unless the authorization is terminated  or revoked sooner.       Influenza A by PCR NEGATIVE NEGATIVE Final   Influenza B by PCR NEGATIVE NEGATIVE Final    Comment: (NOTE) The Xpert Xpress SARS-CoV-2/FLU/RSV plus assay is intended as an aid in the diagnosis of influenza from Nasopharyngeal swab specimens and should not be used as a sole basis for treatment. Nasal washings and aspirates are unacceptable for Xpert Xpress SARS-CoV-2/FLU/RSV testing.  Fact Sheet for Patients: EntrepreneurPulse.com.au  Fact Sheet for Healthcare Providers: IncredibleEmployment.be  This test is not yet approved or cleared by the [place] FDA and has been authorized for detection and/or diagnosis of SARS-CoV-2 by FDA under an Emergency Use Authorization (EUA). This EUA will remain in effect (meaning this test can be used) for the duration of the COVID-19 declaration under Section 564(b)(1) of the Act, 21 U.S.C. section 360bbb-3(b)(1), unless the authorization is terminated or revoked.  Performed at [facility], [address], [place], [place] [address]   Blood Culture ID Panel (Reflexed)     Status: Abnormal   Collection Time: 06/04/20  5:18  PM  Result Value Ref Range Status   Enterococcus faecalis NOT DETECTED NOT DETECTED Final   Enterococcus Faecium NOT DETECTED NOT DETECTED Final   Listeria monocytogenes NOT DETECTED NOT DETECTED Final   Staphylococcus species DETECTED (A) NOT DETECTED Final    Comment: CRITICAL RESULT CALLED TO, READ BACK BY AND VERIFIED WITH: [facility] MITCHELL 1607 06/06/2020 DLB    Staphylococcus aureus (BCID) NOT DETECTED NOT DETECTED Final   Staphylococcus epidermidis DETECTED (A) NOT DETECTED Final    Comment: CRITICAL RESULT CALLED TO, READ BACK BY AND VERIFIED WITH: Elvera Maria 3710 06/06/2020 DLB    Staphylococcus lugdunensis NOT DETECTED NOT DETECTED Final   Streptococcus species NOT  DETECTED NOT DETECTED Final   Streptococcus agalactiae NOT DETECTED NOT DETECTED Final   Streptococcus pneumoniae NOT DETECTED NOT DETECTED Final   Streptococcus pyogenes NOT DETECTED NOT DETECTED Final   A.calcoaceticus-baumannii NOT DETECTED NOT DETECTED Final   Bacteroides fragilis NOT DETECTED NOT DETECTED Final   Enterobacterales NOT DETECTED NOT DETECTED Final   Enterobacter cloacae complex NOT DETECTED NOT DETECTED Final   Escherichia coli NOT DETECTED NOT DETECTED Final   Klebsiella aerogenes NOT DETECTED NOT DETECTED Final   Klebsiella oxytoca NOT DETECTED NOT DETECTED Final   Klebsiella pneumoniae NOT DETECTED NOT DETECTED Final   Proteus species NOT DETECTED NOT DETECTED Final   Salmonella species NOT DETECTED NOT DETECTED Final   Serratia marcescens NOT DETECTED NOT DETECTED Final   Haemophilus influenzae NOT DETECTED NOT DETECTED Final   Neisseria meningitidis NOT DETECTED NOT DETECTED Final   Pseudomonas aeruginosa NOT DETECTED NOT DETECTED Final   Stenotrophomonas maltophilia NOT DETECTED NOT DETECTED Final   Candida albicans NOT DETECTED NOT DETECTED Final   Candida auris NOT DETECTED NOT DETECTED Final   Candida glabrata NOT DETECTED NOT DETECTED Final   Candida krusei NOT DETECTED NOT DETECTED Final   Candida parapsilosis NOT DETECTED NOT DETECTED Final   Candida tropicalis NOT DETECTED NOT DETECTED Final   Cryptococcus neoformans/gattii NOT DETECTED NOT DETECTED Final   Methicillin resistance mecA/C NOT DETECTED NOT DETECTED Final    Comment: Performed at [facility], [address] [place], [place] [address]  Blood culture (single)     Status: None   Collection Time: 06/04/20  9:48 PM   Specimen: BLOOD  Result Value Ref Range Status   Specimen Description BLOOD BLOOD RIGHT ARM  Final   Special Requests   Final    BOTTLES DRAWN AEROBIC AND ANAEROBIC Blood Culture adequate volume   Culture   Final    NO GROWTH 5 DAYS Performed at [facility], [address], [place], [place] [address]    Report Status 06/09/2020 FINAL  Final  Urine Culture     Status: None   Collection Time: 06/06/20  2:37 PM   Specimen: Urine, Random  Result Value Ref Range Status   Specimen Description   Final    URINE, RANDOM Performed at [facility], [address], [place], [place] [address]    Special Requests   Final    NONE Performed at [facility], [address] [place], [place] [address]    Culture   Final    NO GROWTH Performed at [facility], [address], [place], [place] [address]    Report Status 06/08/2020 FINAL  Final  MRSA PCR Screening     Status: None   Collection Time: 06/06/20  2:40 PM   Specimen: Nasopharyngeal  Result Value Ref Range  Status   MRSA by PCR NEGATIVE NEGATIVE Final    Comment:        The GeneXpert MRSA Assay (FDA approved for NASAL specimens only), is one component of a comprehensive MRSA colonization surveillance program. It is not intended to diagnose MRSA infection nor to guide or monitor treatment for MRSA infections. Performed at [facility], [place], [place] [address]   Acid Fast Smear (AFB)     Status: None   Collection Time: 06/06/20  6:30 PM   Specimen: Bronchoalveolar Lavage; Sputum  Result Value Ref Range Status   AFB Specimen Processing Concentration  Final   Acid Fast Smear Negative  Final    Comment: (NOTE) Performed At: [facility] [place], [place] [id number] Rush Farmer MD OZ:[id number]    Source (AFB) BRONCHIAL ALVEOLAR LAVAGE  Corrected    Comment: Performed at [facility], [address] [place], [place] [address] CORRECTED ON 06/02 AT 2239: PREVIOUSLY REPORTED AS SPUTUM   Culture, Respiratory w Gram Stain     Status: None   Collection Time: 06/06/20  6:30 PM   Specimen: Bronchoalveolar Lavage  Result Value Ref Range Status   Specimen Description   Final    BRONCHIAL  ALVEOLAR LAVAGE Performed at [facility], [address] [place], [place] [address]    Special Requests   Final    NONE Performed at [facility], [address] [place], [place] [address]    Gram Stain   Final    FEW WBC PRESENT,BOTH PMN AND MONONUCLEAR NO ORGANISMS SEEN    Culture   Final    NO GROWTH 3 DAYS Performed at [facility], [place] [address] [place], [place] [address]    Report Status 06/09/2020 FINAL  Final  Fungus Culture With Stain     Status: None (Preliminary result)   Collection Time: 06/06/20  6:30 PM  Result Value Ref Range Status   Fungus Stain Final report  Final    Comment: (NOTE) Performed At: [facility] [place], [place] [id number] Rush Farmer MD [id number]    Fungus (Mycology) Culture PENDING  Incomplete   Fungal Source BRONCHIAL ALVEOLAR LAVAGE  Final    Comment: Performed at [facility], [place] [address], [place], [place] [address]  Fungus Culture Result     Status: None   Collection Time: 06/06/20  6:30 PM  Result Value Ref Range Status   Result 1 Comment  Final    Comment: (NOTE) KOH/Calcofluor preparation:  no fungus observed. Performed At: [facility] [place], [place] [id number] Rush Farmer MD KZ:[id number]   Respiratory (~20 pathogens) panel by PCR     Status: None   Collection Time: 06/07/20  5:50 AM   Specimen: Nasopharyngeal Swab; Respiratory  Result Value Ref Range Status   Adenovirus NOT DETECTED NOT DETECTED Final   Coronavirus 229E NOT DETECTED NOT DETECTED Final    Comment: (NOTE) The Coronavirus on the Respiratory Panel, DOES NOT test for the novel  Coronavirus (2019 nCoV)    Coronavirus HKU1 NOT DETECTED NOT DETECTED Final   Coronavirus NL63 NOT DETECTED NOT DETECTED Final   Coronavirus OC43 NOT DETECTED NOT DETECTED Final   Metapneumovirus NOT DETECTED NOT DETECTED Final   Rhinovirus / Enterovirus NOT DETECTED NOT DETECTED Final    Influenza A NOT DETECTED NOT DETECTED Final   Influenza B NOT DETECTED NOT DETECTED Final   Parainfluenza Virus 1 NOT  DETECTED NOT DETECTED Final   Parainfluenza Virus 2 NOT DETECTED NOT DETECTED Final   Parainfluenza Virus 3 NOT DETECTED NOT DETECTED Final   Parainfluenza Virus 4 NOT DETECTED NOT DETECTED Final   Respiratory Syncytial Virus NOT DETECTED NOT DETECTED Final   Bordetella pertussis NOT DETECTED NOT DETECTED Final   Bordetella Parapertussis NOT DETECTED NOT DETECTED Final   Chlamydophila pneumoniae NOT DETECTED NOT DETECTED Final   Mycoplasma pneumoniae NOT DETECTED NOT DETECTED Final    Comment: Performed at [facility], [place] [address] [place], [place] [address]  Culture, Respiratory w Gram Stain     Status: None   Collection Time: 06/18/20  4:43 PM   Specimen: Tracheal Aspirate; Respiratory  Result Value Ref Range Status   Specimen Description   Final    TRACHEAL ASPIRATE Performed at [facility], [address] [place], [place] [address]    Special Requests   Final    NONE Performed at [facility], [address] [place], [place] [address]    Gram Stain   Final    MODERATE WBC PRESENT,BOTH PMN AND MONONUCLEAR FEW YEAST RARE GRAM NEGATIVE RODS Performed at [facility], [place] [address], [place], [place] [address]    Culture FEW CANDIDA ALBICANS  Final   Report Status 06/21/2020 FINAL  Final    Coagulation Studies: No results for input(s): LABPROT, INR in the last 72 hours.  Urinalysis: No results for input(s): COLORURINE, LABSPEC, PHURINE, GLUCOSEU, HGBUR, BILIRUBINUR, KETONESUR, PROTEINUR, UROBILINOGEN, NITRITE, LEUKOCYTESUR in the last 72 hours.  Invalid input(s): APPERANCEUR    Imaging: CT HEAD WO CONTRAST  Result Date: 06/22/2020 CLINICAL DATA:  Stroke, follow-up. Slow recovery of metabolic encephalopathy. EXAM: CT HEAD WITHOUT CONTRAST TECHNIQUE: Contiguous axial images were obtained from the base of the skull  through the vertex without intravenous contrast. COMPARISON:  MR head 06/07/2020. CT head without contrast 06/04/2020 FINDINGS: Brain: Mild atrophy and white matter changes are stable. No acute infarct, hemorrhage, or mass lesion is present. Basal ganglia are intact. Insular ribbon is normal bilaterally. The brainstem and cerebellum are within normal limits. Vascular: No hyperdense vessel or unexpected calcification. Calvarium is intact. No focal lytic or blastic lesions are present. No significant extracranial soft tissue lesion is present. Skull: Insert normal skull No significant extracranial soft tissue lesion is present. Sinuses/Orbits: Minimal fluid is present in the posterior mastoid air cells bilaterally. No obstructing nasopharyngeal lesion is present. NG tube is in place. Minimal mucosal thickening is present in the inferior maxillary sinuses bilaterally. The paranasal sinuses and mastoid air cells are otherwise clear. The globes and orbits are within normal limits. IMPRESSION: 1. Stable atrophy and white matter disease. 2. No acute intracranial abnormality or significant interval change. Electronically Signed   By: San Morelle M.D.   On: 06/22/2020 16:49     Medications:    sodium chloride Stopped (06/22/20 1614)   feeding supplement (NEPRO CARB STEADY) 1,000 mL (06/23/20 1718)    amLODipine  5 mg Per Tube Daily   chlorhexidine gluconate (MEDLINE KIT)  15 mL Mouth Rinse BID   Chlorhexidine Gluconate Cloth  6 each Topical Daily   cloNIDine  0.3 mg Transdermal Weekly   feeding supplement (PROSource TF)  45 mL Per Tube Daily   free water  100 mL Per Tube Q4H   heparin injection (subcutaneous)  5,000 Units Subcutaneous Q8H   insulin aspart  0-20 Units Subcutaneous Q4H   insulin aspart  3 Units Subcutaneous Q4H  insulin glargine  20 Units Subcutaneous Daily   multivitamin  1 tablet Per Tube QHS   nutrition supplement (JUVEN)  1 packet Per Tube BID   pantoprazole (PROTONIX) IV  40  mg Intravenous Q24H   prazosin  4 mg Per Tube QHS   sodium chloride flush  10-40 mL Intracatheter Q12H   sodium chloride, heparin sodium (porcine), heparin sodium (porcine), hydrALAZINE, labetalol, ondansetron **OR** ondansetron (ZOFRAN) IV, oxyCODONE-acetaminophen, prazosin, sodium chloride flush  Assessment/ Plan:  Wyatt Ball. is a 60 y.o. white male with hypertension, PTSD, diabetes mellitus type II, migraines, GERD, neurogenic bladder, history of transverse myelitis, who is admitted to [facility] on 06/04/2020 for Sepsis ([place]) [A41.9] Altered mental status, unspecified altered mental status type [R41.82] Community acquired pneumonia, unspecified laterality [J18.9]  First dialysis was 6/6.   Acute kidney injury: admitting creatinine of 1.06 with GFR >60. Underlying proteinuria, hematuria, glycosuria consistent with diabetic nephropathy. Patient was also taking ibuprofen regularly at home. Patient with the [place], baseline creatinine not available. Acute kidney injury secondary to sepsis, ATN, IV contrast. Serologic work up negative. First hemodialysis treatment on June 6. Seen and examined on hemodialysis treatment. Hold dialysis tomorrow and monitor for recovery.   Hypertension: elevated readings, Current regimen of prazosin, amlodipine and clonidine patch. Home regimen of lisinopril and metoprolol, both are on hold.   Diabetes mellitus type II with renal manifestations: noninsulin dependent. holding home metformin. Hemoglobin A1c of 7.4%.   Altered mental status: appreciate neurology input.   Overall prognosis is poor. Not sure if patient is a candidate for long term dialysis.    LOS: 20 Chela Sutphen 6/20/202211:29 AM

## 2020-06-24 NOTE — Progress Notes (Signed)
Eeg done 

## 2020-06-24 NOTE — Progress Notes (Signed)
PT Cancellation Note  Patient Details Name: Wyatt Ball. MRN: [id number] DOB: 09-25-1960   Cancelled Treatment:     Attempted to see pt for PT tx but pt off unit, out of room upon PT arrival. Will f/u up as able.  Aleda Grana, PT, DPT 06/24/20, 12:36 PM    Sandi Mariscal 06/24/2020, 12:35 PM

## 2020-06-25 ENCOUNTER — Inpatient Hospital Stay: Payer: No Typology Code available for payment source

## 2020-06-25 DIAGNOSIS — J189 Pneumonia, unspecified organism: Secondary | ICD-10-CM | POA: Diagnosis not present

## 2020-06-25 DIAGNOSIS — Z515 Encounter for palliative care: Secondary | ICD-10-CM | POA: Diagnosis not present

## 2020-06-25 DIAGNOSIS — R4182 Altered mental status, unspecified: Secondary | ICD-10-CM | POA: Diagnosis not present

## 2020-06-25 DIAGNOSIS — Z7189 Other specified counseling: Secondary | ICD-10-CM

## 2020-06-25 DIAGNOSIS — L8990 Pressure ulcer of unspecified site, unspecified stage: Secondary | ICD-10-CM | POA: Insufficient documentation

## 2020-06-25 DIAGNOSIS — G9341 Metabolic encephalopathy: Secondary | ICD-10-CM | POA: Diagnosis not present

## 2020-06-25 LAB — GLUCOSE, CAPILLARY
Glucose-Capillary: 118 mg/dL — ABNORMAL HIGH (ref 70–99)
Glucose-Capillary: 120 mg/dL — ABNORMAL HIGH (ref 70–99)
Glucose-Capillary: 137 mg/dL — ABNORMAL HIGH (ref 70–99)
Glucose-Capillary: 138 mg/dL — ABNORMAL HIGH (ref 70–99)
Glucose-Capillary: 143 mg/dL — ABNORMAL HIGH (ref 70–99)
Glucose-Capillary: 173 mg/dL — ABNORMAL HIGH (ref 70–99)

## 2020-06-25 LAB — ROCKY MTN SPOTTED FVR ABS PNL(IGG+IGM)
RMSF IgG: UNDETERMINED
RMSF IgM: 0.73 index (ref 0.00–0.89)

## 2020-06-25 LAB — CBC WITH DIFFERENTIAL/PLATELET
Abs Immature Granulocytes: 0.02 10*3/uL (ref 0.00–0.07)
Basophils Absolute: 0 10*3/uL (ref 0.0–0.1)
Basophils Relative: 0 %
Eosinophils Absolute: 0.3 10*3/uL (ref 0.0–0.5)
Eosinophils Relative: 4 %
HCT: 25.2 % — ABNORMAL LOW (ref 39.0–52.0)
Hemoglobin: 8.5 g/dL — ABNORMAL LOW (ref 13.0–17.0)
Immature Granulocytes: 0 %
Lymphocytes Relative: 18 %
Lymphs Abs: 1.3 10*3/uL (ref 0.7–4.0)
MCH: 30.5 pg (ref 26.0–34.0)
MCHC: 33.7 g/dL (ref 30.0–36.0)
MCV: 90.3 fL (ref 80.0–100.0)
Monocytes Absolute: 0.6 10*3/uL (ref 0.1–1.0)
Monocytes Relative: 8 %
Neutro Abs: 5.2 10*3/uL (ref 1.7–7.7)
Neutrophils Relative %: 70 %
Platelets: 206 10*3/uL (ref 150–400)
RBC: 2.79 MIL/uL — ABNORMAL LOW (ref 4.22–5.81)
RDW: 14.7 % (ref 11.5–15.5)
WBC: 7.5 10*3/uL (ref 4.0–10.5)
nRBC: 0 % (ref 0.0–0.2)

## 2020-06-25 LAB — PHOSPHORUS: Phosphorus: 6.6 mg/dL — ABNORMAL HIGH (ref 2.5–4.6)

## 2020-06-25 LAB — RMSF, IGG, IFA: RMSF, IGG, IFA: 1:64 {titer} — ABNORMAL HIGH

## 2020-06-25 LAB — MAGNESIUM: Magnesium: 2.4 mg/dL (ref 1.7–2.4)

## 2020-06-25 IMAGING — DX DG ABDOMEN 1V
1 series · 1 of 1 positions shown · non-contrast
Comparison: None.

CLINICAL DATA: NG tube placement

EXAM:
ABDOMEN - 1 VIEW

[abdomen supine]
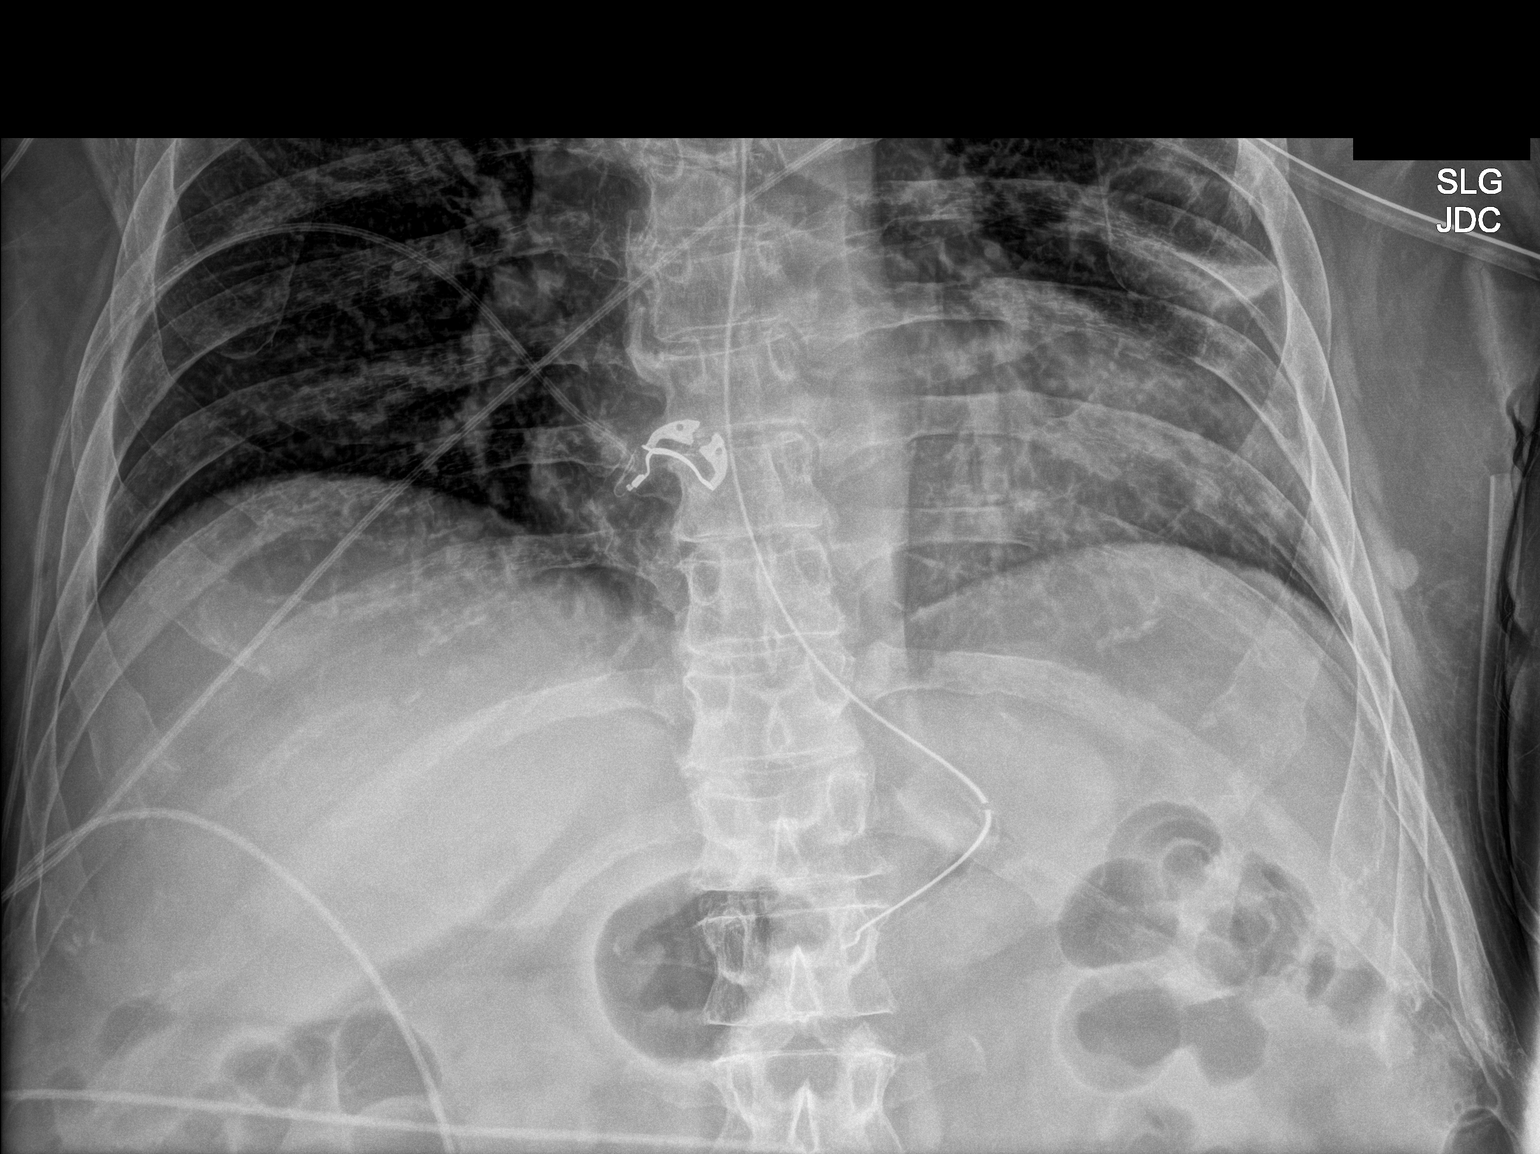

[1 of 1 positions shown; findings below may reference images not displayed]

FINDINGS: Nasogastric tube has been advanced with tip and side port overlying
the stomach.
IMPRESSION: Nasogastric tube tip and side port overlie the stomach.

## 2020-06-25 MED ORDER — HALOPERIDOL LACTATE 5 MG/ML IJ SOLN
2.0000 mg | Freq: Four times a day (QID) | INTRAMUSCULAR | Status: DC | PRN
  Administered 2020-06-25 – 2020-07-19 (×16): 2 mg via INTRAVENOUS
  Filled 2020-06-25 (×19): qty 1

## 2020-06-25 MED ORDER — QUETIAPINE FUMARATE 25 MG PO TABS
12.5000 mg | ORAL_TABLET | Freq: Two times a day (BID) | ORAL | Status: DC
  Administered 2020-06-25 – 2020-07-16 (×39): 12.5 mg
  Filled 2020-06-25 (×24): qty 1
  Filled 2020-06-25: qty 0.5
  Filled 2020-06-25 (×9): qty 1
  Filled 2020-06-25: qty 0.5
  Filled 2020-06-25 (×6): qty 1

## 2020-06-25 MED ORDER — METOPROLOL TARTRATE 25 MG PO TABS
25.0000 mg | ORAL_TABLET | Freq: Two times a day (BID) | ORAL | Status: DC
  Administered 2020-06-25 – 2020-07-12 (×33): 25 mg
  Filled 2020-06-25 (×34): qty 1

## 2020-06-25 MED ORDER — FLUCONAZOLE IN SODIUM CHLORIDE 200-0.9 MG/100ML-% IV SOLN
200.0000 mg | Freq: Once | INTRAVENOUS | Status: AC
  Administered 2020-06-25: 200 mg via INTRAVENOUS
  Filled 2020-06-25: qty 100

## 2020-06-25 MED ORDER — GABAPENTIN 250 MG/5ML PO SOLN
100.0000 mg | Freq: Three times a day (TID) | ORAL | Status: DC
  Administered 2020-06-25 – 2020-07-22 (×78): 100 mg
  Filled 2020-06-25 (×90): qty 2

## 2020-06-25 MED ORDER — FLUCONAZOLE 100MG IVPB
50.0000 mg | INTRAVENOUS | Status: AC
  Administered 2020-06-26 – 2020-06-30 (×5): 50 mg via INTRAVENOUS
  Filled 2020-06-25 (×6): qty 25

## 2020-06-25 MED ORDER — TRAZODONE HCL 100 MG PO TABS
150.0000 mg | ORAL_TABLET | Freq: Every day | ORAL | Status: DC
  Administered 2020-06-25 – 2020-08-01 (×38): 150 mg
  Filled 2020-06-25: qty 2
  Filled 2020-06-25: qty 1.5
  Filled 2020-06-25: qty 3
  Filled 2020-06-25 (×5): qty 1.5
  Filled 2020-06-25: qty 2
  Filled 2020-06-25: qty 3
  Filled 2020-06-25: qty 1.5
  Filled 2020-06-25: qty 2
  Filled 2020-06-25 (×2): qty 1.5
  Filled 2020-06-25: qty 2
  Filled 2020-06-25 (×3): qty 1.5
  Filled 2020-06-25 (×2): qty 2
  Filled 2020-06-25: qty 3
  Filled 2020-06-25: qty 1.5
  Filled 2020-06-25: qty 3
  Filled 2020-06-25: qty 1.5
  Filled 2020-06-25: qty 3
  Filled 2020-06-25 (×3): qty 1.5
  Filled 2020-06-25: qty 3
  Filled 2020-06-25 (×3): qty 1.5
  Filled 2020-06-25: qty 2
  Filled 2020-06-25 (×4): qty 1.5
  Filled 2020-06-25: qty 3
  Filled 2020-06-25 (×2): qty 1.5

## 2020-06-25 NOTE — Progress Notes (Addendum)
SLP Cancellation Note  Patient Details Name: Wyatt Ball. MRN: [id number] DOB: [birth year]   Cancelled treatment:       Reason Eval/Treat Not Completed: Medical issues which prohibited therapy;Fatigue/lethargy limiting ability to participate;Patient not medically ready (chart reviewed; consulted NSG re: pt) Per NSG, pt has pulled out the NGT. Medication given d/t agitation. Noted the Cognitive-communication decline during PT's recent session as well. Will hold on dysphagia tx today and attempt f/u next 1-2 days when pt is more alert and can engage appropriately w/ po trials to assess safety of oral intake. Recommend continued discussion of overall GOC; pt may need more long term nutritional support (PEG) if family wishes for continued aggressive care. Recommend frequent oral care for hygiene and stimulation of swallowing. NSG agreed.      Jerilynn Som, MS, CCC-SLP Speech Language Pathologist Rehab Services [phone] [facility] 06/25/2020, 3:39 PM

## 2020-06-25 NOTE — Consult Note (Signed)
Pharmacy Antibiotic Note  Wyatt Ball. is a 60 y.o. male admitted on 06/04/2020 with prolonged hospitalization with septic shock, acute renal failure requiring HD, and acute respiratory requiring intubation and ICU stay. 5/21 admitted for sepsis and pneumonia. 6/2 transferred to ICU, intubated underwent bronchoscopy 6/3 self extubated followed by reintubated 6/7 HD started 6/15 extubated 6/19 transferred to [facility]. 6/21 Pharmacy has been consulted for fluconazole dosing for oropharyngeal candidiasis  CrCl 16.1 -- per nephrology holding off on further HD sessions at this time  Plan: Will order IV medication due to patient unable to swallow oral medications/agitation Fluconazole 200 mg x 1 LD followed by Fluconazole reduced by 50% to 50 mg Q24H per renal function   Height: 5' 5.98" (167.6 cm) Weight: 85.2 kg (187 lb 13.3 oz) IBW/kg (Calculated) : 63.76  Temp (24hrs), Avg:98.5 F (36.9 C), Min:98.1 F (36.7 C), Max:98.9 F (37.2 C)  Recent Labs  Lab 06/20/20 0417 06/21/20 0606 06/22/20 0542 06/23/20 0454 06/24/20 0504 06/25/20 0558  WBC 10.7* 8.5 7.0 8.1 7.1 7.5  CREATININE 4.43* 5.19* 3.70* 4.52* 4.94*  --     Estimated Creatinine Clearance: 16.1 mL/min (A) (by C-G formula based on SCr of 4.94 mg/dL (H)).    No Known Allergies   Dose adjustments this admission: N/a  Microbiology results: 6/14 Resp culture: few candida albicans 6/02 fugus culture result: NG   Thank you for allowing pharmacy to be a part of this patient's care.  Sharen Hones, PharmD, BCPS Clinical Pharmacist   06/25/2020 6:36 PM

## 2020-06-25 NOTE — Consult Note (Signed)
Consultation Note Date: 06/25/2020   Patient Name: Wyatt Ball.  DOB: 1960-11-30  MRN: [id number]  Age / Sex: 60 y.o., male  PCP: System, Provider Not In Referring Physician: Lavina Hamman, MD  Reason for Consultation: Establishing goals of care  HPI/Patient Profile: 60 y.o. male  with past medical history of hypertension, PTSD, migraines, GERD, neurogenic bladder due to remote history of transverse myelitis admitted on 06/04/2020 with altered mental status due to eft lung pneumonia sepsis requiring intubation. Hospitalization complicated by prolonged vent support, renal failure requiring dialysis, and ongoing encephalopathy.   Clinical Assessment and Goals of Care: I met today at Wyatt Ball's bedside but no family present. He is lying in bed. He opens his eyes and looks at me briefly but no other response. No verbal response and no following commands.   I attempted to call daughter, Wyatt Ball, but no answer but I did leave voicemail.   I called and spoke with brother, Wyatt Ball. Wyatt Ball shares that Wyatt Ball has lived with him the past 2-3 years when he separated from his wife. Wyatt Ball reports that Wyatt Ball was completely independent and functional with not even any signs of forgetfulness. He reports that his PTSD and chronic pain have been controlled with no changes to medication regimen needed for years. He managed his own medication. He is not married and has one daughter. Wyatt Ball reports that he is in communication with Wyatt Ball.   Wyatt Ball shares that he does feel that Wyatt Ball is making slow improvement in his responsiveness. We did discuss that he was very agitated and yelling help me repeatedly until provided medication to calm him. Wyatt Ball shares that his impression has been that his brother's neurological status will improve as the infection and toxins from dialysis are cleared. I explained that we have stopped dialysis for Wyatt Ball and  urine output is good right now but in his current state he would not be a candidate to restart dialysis. I explained that there are many reasons why Wyatt Ball may not continue to improve and could plateau. I explained that there may be need to make a decision for possible long term feeding tube. I expressed to Wyatt Ball the importance of considering his brother's status, quality of life, and what Wyatt Ball would find acceptable quality of life. Wyatt Ball is still very hopeful. However his brother does admit that if he knew that this was as good as it was going to get "I would have let him pass." They need more time for outcomes before making any major decisions. I encouraged Wyatt Ball to continue to discuss these topics with Wyatt Ball as well.   All questions/concerns addressed. Emotional support provided. I will continue to follow.   Primary Decision Maker NEXT OF KIN daughter is legal surrogate    SUMMARY OF RECOMMENDATIONS   - Ongoing goals of care discussions needed  Code Status/Advance Care Planning: Full code   Symptom Management:  Per attending.   Palliative Prophylaxis:  Bowel Regimen, Delirium Protocol, Frequent Pain Assessment, Oral Care, and Turn Reposition  Prognosis:  Overall prognosis  poor with prolonged hospitalization and minimal improvement/progress.   Discharge Planning: To Be Determined      Primary Diagnoses: Present on Admission: **None**   I have reviewed the medical record, interviewed the patient and family, and examined the patient. The following aspects are pertinent.  Past Medical History:  Diagnosis Date   Anxiety    DM (diabetes mellitus) ([place])    HTN (hypertension)    Transverse myelitis ([place])    Social History   Socioeconomic History   Marital status: Single    Spouse name: Not on file   Number of children: Not on file   Years of education: Not on file   Highest education level: Not on file  Occupational History   Not on file  Tobacco Use   Smoking status:  Every Day    Pack years: 0.00   Smokeless tobacco: Never  Substance and Sexual Activity   Alcohol use: Never   Drug use: Not on file   Sexual activity: Not on file  Other Topics Concern   Not on file  Social History Narrative   Not on file   Social Determinants of Health   Financial Resource Strain: Not on file  Food Insecurity: Not on file  Transportation Needs: Not on file  Physical Activity: Not on file  Stress: Not on file  Social Connections: Not on file   Family History  Problem Relation Age of Onset   CAD Mother    Diabetes Mother    CAD Father    Diabetes Father    CAD Brother    Diabetes Brother    Scheduled Meds:  amLODipine  5 mg Per Tube Daily   chlorhexidine gluconate (MEDLINE KIT)  15 mL Mouth Rinse BID   Chlorhexidine Gluconate Cloth  6 each Topical Daily   cloNIDine  0.3 mg Transdermal Weekly   feeding supplement (PROSource TF)  45 mL Per Tube Daily   free water  100 mL Per Tube Q4H   gabapentin  100 mg Per Tube Q8H   heparin injection (subcutaneous)  5,000 Units Subcutaneous Q8H   insulin aspart  0-20 Units Subcutaneous Q4H   insulin aspart  3 Units Subcutaneous Q4H   insulin glargine  20 Units Subcutaneous Daily   metoprolol tartrate  25 mg Per Tube BID   multivitamin  1 tablet Per Tube QHS   nutrition supplement (JUVEN)  1 packet Per Tube BID   pantoprazole (PROTONIX) IV  40 mg Intravenous Q24H   prazosin  4 mg Per Tube QHS   QUEtiapine  12.5 mg Per Tube BID   sodium chloride flush  10-40 mL Intracatheter Q12H   traZODone  150 mg Per Tube QHS   Continuous Infusions:  sodium chloride Stopped (06/22/20 1614)   feeding supplement (NEPRO CARB STEADY) 1,000 mL (06/25/20 1522)   PRN Meds:.sodium chloride, haloperidol lactate, heparin sodium (porcine), heparin sodium (porcine), hydrALAZINE, labetalol, ondansetron **OR** ondansetron (ZOFRAN) IV, oxyCODONE-acetaminophen, prazosin, sodium chloride flush No Known Allergies Review of Systems  Unable to  perform ROS: Acuity of condition   Physical Exam Vitals and nursing note reviewed.  Constitutional:      General: He is sleeping. He is not in acute distress.    Appearance: He is ill-appearing.  Cardiovascular:     Rate and Rhythm: Tachycardia present.  Pulmonary:     Effort: No tachypnea, accessory muscle usage or respiratory distress.     Comments: Room air Neurological:     Mental Status: He is lethargic and  confused.    Vital Signs: BP 132/73 (BP Location: Left Arm)   Pulse (!) 106   Temp 98.1 F (36.7 C) (Oral)   Resp 18   Ht 5' 5.98" (1.676 m)   Wt 85.2 kg   SpO2 94%   BMI 30.33 kg/m  Pain Scale: Faces POSS *See Group Information*: S-Acceptable,Sleep, easy to arouse Pain Score: 8    SpO2: SpO2: 94 % O2 Device:SpO2: 94 % O2 Flow Rate: .O2 Flow Rate (L/min): 2 L/min  IO: Intake/output summary:  Intake/Output Summary (Last 24 hours) at 06/25/2020 1553 Last data filed at 06/25/2020 1136 Gross per 24 hour  Intake 240 ml  Output 1850 ml  Net -1610 ml    LBM: Last BM Date: 06/25/20 Baseline Weight: Weight: 90.7 kg Most recent weight: Weight: 85.2 kg     Palliative Assessment/Data:     Time In: 1600 Time Out: 1650 Time Total: 50 min Greater than 50%  of this time was spent counseling and coordinating care related to the above assessment and plan.  Signed by: Vinie Sill, NP Palliative Medicine Team Pager # [phone] (M-F 8a-5p) Team Phone # [phone] (Nights/Weekends)

## 2020-06-25 NOTE — Progress Notes (Signed)
Pt is alert, unable to access orientation. VS stable, NSR/tach on the monitor. Triple lumen in place clean/dry/intact. HD cath in place. Foley in place. Skin on bottom starting to look improved. NG in place with tube feedings. Had BM tonight. Got something for pain this am

## 2020-06-25 NOTE — Progress Notes (Signed)
Occupational Therapy Treatment Patient Details Name: Wyatt Ball. MRN: [id number] DOB: 1960/05/03 Today's Date: 06/25/2020    History of present illness Wyatt Ball. is a 60 y.o. 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  Since hospitalization pt has been intubated, finally extubated 6/15.   OT comments  Pt seen for OT treatment on this date. Upon arrival to room pt semi fowlers position in bed, appears to be sleeping and does not rouse to VCs. With multimodal cueing, pt is able to open eyes and appears to visually attend to therapist briefly. OT attempts to facilitate oral care with swab/oral gel, hair combing, and face washing. Pt does not attempt to actively hold or utilize any of the materials provided for ADLs as described. He does not engage with this author visually for more than ~5 seconds at a time. Very limited to no meaningful participation in ADL tasks at this time. Will continue to follow pt for OT services on trial basis determine if stimulation helps improve pt's general interactions and subsequently potentially improve upon his ability to contribute/meaningfully participate in ADLs). Will continue to follow POC as written, DC recommendation limited 2/2 pain cognition, however, continue to anticipate pt will require significant assistance upon hospital DC.   Follow Up Recommendations  SNF;Supervision/Assistance - 24 hour    Equipment Recommendations   (TBD with pt progress and once better home setup/prior equipment information can be addressed)    Recommendations for Other Services      Precautions / Restrictions  Precautions Precautions: Fall Restrictions Weight Bearing Restrictions: No       Mobility Bed Mobility               General bed mobility comments: deferred for safety    Transfers                      Balance Overall balance assessment:  (Unable/unsafe to attempt mobility/balance.)                                         ADL either performed or assessed with clinical judgement   ADL Overall ADL's : Needs assistance/impaired                                       General ADL Comments: TOTAL A for all ADLs at this time, does appear to attempt to engage in bed level (high-fowler's position). OT attempts to facilitate oral care with swab, hair combing, and face washing. Pt does not attempt to actively hold or utilize any of the materials provided for ADLs as described. He does not engage with this author visually for more than ~5 seconds at a time. Very limited to no meaningful participation in ADL tasks at this time.     Vision   Additional Comments: Limited visual attention to tasks/author appreciated. Pt does not appear to visually track a stimulus   Perception     Praxis      Cognition Arousal/Alertness:  Lethargic Behavior During Therapy: Agitated;Restless Overall Cognitive Status: Difficult to assess                                 General Comments: Pt unable to engage meaningfully with therapist during session. Does not follow VCs, no family/caregivers present to determine baseline.        Exercises Exercises: General Lower Extremity;General Upper Extremity General Exercises - Upper Extremity Shoulder Flexion: AAROM;Both;5 reps Elbow Flexion: AAROM;5 reps Elbow Extension: AAROM;5 reps General Exercises - Lower Extremity Ankle Circles/Pumps: PROM;10 reps Heel Slides: PROM;10 reps Hip ABduction/ADduction: PROM;10 reps Other Exercises Other Exercises: OT facilitates bed-level grooming tasks including  oral care (with sponge/oral gel), hair combing, face washing. Pt requires TOTAL assist.   Shoulder Instructions       General Comments Pt left with NG tube in place, safety mitts donned at start/end of session.    Pertinent Vitals/ Pain       Pain Assessment: Faces Faces Pain Scale: Hurts a little bit Pain Location: pt states "hurts" as only verbalization during session. Unable to localize or meaningfully communicate during session. Pain Descriptors / Indicators: Grimacing Pain Intervention(s): Limited activity within patient's tolerance;Monitored during session  Home Living Family/patient expects to be discharged to:: Unsure                                        Prior Functioning/Environment              Frequency  Min 1X/week        Progress Toward Goals  OT Goals(current goals can now be found in the care plan section)  Progress towards OT goals: Progressing toward goals  Acute Rehab OT Goals Patient Stated Goal: unable to state OT Goal Formulation: Patient unable to participate in goal setting Time For Goal Achievement: 07/04/20 Potential to Achieve Goals: Poor  Plan Discharge plan remains appropriate;Frequency remains appropriate    Co-evaluation                 AM-PAC OT "6 Clicks" Daily Activity     Outcome Measure   Help from another person eating meals?: Total (NPO with NG tube.) Help from another person taking care of personal grooming?: Total Help from another person toileting, which includes using toliet, bedpan, or urinal?: Total Help from another person bathing (including washing, rinsing, drying)?: Total Help from another person to put on and taking off regular upper body clothing?: Total Help from another person to put on and taking off regular lower body clothing?: Total 6 Click Score: 6    End of Session    OT Visit Diagnosis: Other symptoms and signs involving cognitive function   Activity Tolerance      Patient Left in bed;with call bell/phone within reach;with bed alarm set;Other (comment) (With safety mitts donned.)   Nurse Communication Mobility status        Time: [phone] OT Time Calculation (min): 14 min  Charges: OT General Charges $OT Visit: 1 Visit OT Treatments $Self Care/Home Management : 8-22 mins  Rockney Ghee, M.S., OTR/L Ascom: [phone] 06/25/20, 3:56 PM

## 2020-06-25 NOTE — Progress Notes (Signed)
Physical Therapy Treatment Patient Details Name: Wyatt Ball. MRN: [id number] DOB: 12/13/1960 Today's Date: 06/25/2020    History of Present Illness Wyatt Ball. is a 60 y.o. 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  Since hospitalization pt has been intubated, finally extubated 6/15.    PT Comments    Pt screaming "Help!" t/o the session.  He did not have the ability to make his needs known or to engage further with this PT conversationally and despite multimodal cues and patient effort I could not gather pt's needs/desires.  Attempted multiple U&LE exercises to elicit some AAROM, however the only AROM was impulsive twitching with no ability to actually follow instruction or follow through with PROM/AAROM cues.  Pt effectively unable to participate, currently slated for PT trial to assess appropriateness, will try back one more time in the hopes to get some meaningful effort from PT.    Follow Up Recommendations  SNF     Equipment Recommendations   (TBD)    Recommendations for Other Services       Precautions / Restrictions Precautions Precautions: Fall Restrictions Weight Bearing Restrictions: No    Mobility  Bed Mobility               General bed mobility comments: deferred for safety    Transfers                    Ambulation/Gait                 Stairs             Wheelchair Mobility    Modified Rankin (Stroke Patients Only)       Balance                                            Cognition Arousal/Alertness: Lethargic Behavior During Therapy:  Agitated;Restless Overall Cognitive Status: Difficult to assess                                 General Comments: Pt unable to engage meaningfully with PT.  Even with much prompting he could not say anything other than repeating "Help!"      Exercises General Exercises - Upper Extremity Shoulder Flexion: AAROM;Both;5 reps Elbow Flexion: AAROM;5 reps Elbow Extension: AAROM;5 reps General Exercises - Lower Extremity Ankle Circles/Pumps: PROM;10 reps Heel Slides: PROM;10 reps Hip ABduction/ADduction: PROM;10 reps    General Comments General comments (skin integrity, edema, etc.): Pt with occasional impulsive U&LE twitch-like movements.      Pertinent Vitals/Pain Pain Assessment: Faces Pain Location: Pt could not verbalize anything but did seem to be generally uncomfortable t/o the issue    Home Living Family/patient expects to be discharged to:: Unsure                    Prior Function            PT Goals (current goals can now be found in the care plan section)  Progress towards PT goals: Not progressing toward goals - comment (Pt unable to show meaningful interaction or effort.)    Frequency    Min 2X/week      PT Plan Current plan remains appropriate (Pt has not done well with trial thus far, will try one more time to assess appropriateness of continued PT)    Co-evaluation              AM-PAC PT "6 Clicks" Mobility   Outcome Measure  Help needed turning from your back to your side while in a flat bed without using bedrails?: Total Help needed moving from lying on your back to sitting on the side of a flat bed without using bedrails?: Total Help needed moving to and from a bed to a chair (including a wheelchair)?: Total Help needed standing up from a chair using your arms (e.g., wheelchair or bedside chair)?: Total Help needed to walk in hospital room?: Total Help needed climbing 3-5 steps with a railing? : Total 6 Click Score: 6     End of Session   Activity Tolerance:  (limited due to mental status) Patient left: with bed alarm set;with call bell/phone within reach Nurse Communication: Mobility status (need for oral care) PT Visit Diagnosis: Unsteadiness on feet (R26.81);Muscle weakness (generalized) (M62.81);Difficulty in walking, not elsewhere classified (R26.2);Adult, failure to thrive (R62.7)     Time: [id number] PT Time Calculation (min) (ACUTE ONLY): 14 min  Charges:  $Therapeutic Exercise: 8-22 mins                     Malachi Pro, DPT 06/25/2020, 1:13 PM

## 2020-06-25 NOTE — Progress Notes (Signed)
[facility]  ROUNDING NOTE   Subjective:   Did not tolerate dialysis yesterday. Confused. Hemodialysis catheter not functioning.   Patient not answering questions. Screaming.    Objective:  Vital signs in last 24 hours:  Temp:  [98.3 F (36.8 C)-99.5 F (37.5 C)] 98.3 F (36.8 C) (06/21 0758) Pulse Rate:  [86-120] 86 (06/21 0758) Resp:  [13-20] 18 (06/21 0758) BP: (143-183)/(69-86) 183/83 (06/21 0758) SpO2:  [97 %-100 %] 97 % (06/21 0758)  Weight change:  Filed Weights   06/19/20 0500 06/20/20 0500 06/21/20 0530  Weight: 98.1 kg 98.1 kg 85.2 kg    Intake/Output: I/O last 3 completed shifts: In: 1664.7 [NG/GT:1664.7] Out: 2125 [Urine:2425]   Intake/Output this shift:  No intake/output data recorded.  Physical Exam: General: NAD, laying in bed  Head: Normocephalic, atraumatic. Moist oral mucosal membranes  Eyes: Anicteric, PERRL  Neck: Supple, trachea midline  Lungs:  Clear to auscultation  Heart: Regular rate and rhythm  Abdomen:  Soft, nontender  Extremities:  no peripheral edema.  Neurologic: Moving all four extremities. Not responsive to verbal stimuli  Skin: No lesions  Access: Left IJ temp HD catheter 6/6  GU Foley    Basic Metabolic Panel: Recent Labs  Lab 06/20/20 0417 06/21/20 0606 06/22/20 0542 06/23/20 0454 06/24/20 0504 06/25/20 0558  NA 144 149* 142 146* 146*  --   K 4.4 4.3 3.7 3.5 3.8  --   CL 106 110 105 109 110  --   CO2 22 21* _0 --   GLUCOSE 119* 134* 170* 175* 251*  --   BUN 112* 131* 91* 126* 130*  --   CREATININE 4.43* 5.19* 3.70* 4.52* 4.94*  --   CALCIUM 8.5* 8.9 8.7* 9.3 9.7  --   MG 2.5* 2.8* 2.2 2.5* 2.5* 2.4  PHOS 10.6* 11.9* 9.3* 8.8* 8.0* 6.6*     Liver Function Tests: Recent Labs  Lab 06/22/20 0542 06/24/20 0504  AST 35 46*  ALT 38 72*  ALKPHOS 66 79  BILITOT 0.8 0.7  PROT 6.2* 6.3*  ALBUMIN 2.6* 2.9*    Recent Labs  Lab 06/20/20 1516  LIPASE 40  AMYLASE 61    No results for  input(s): AMMONIA in the last 168 hours.  CBC: Recent Labs  Lab 06/21/20 0606 06/22/20 0542 06/23/20 0454 06/24/20 0504 06/25/20 0558  WBC 8.5 7.0 8.1 7.1 7.5  NEUTROABS 7.1 5.4 6.5 5.5 5.2  HGB 9.6* 9.1* 8.8* 8.9* 8.5*  HCT 28.9* 26.6* 27.2* 27.0* 25.2*  MCV 88.7 88.4 91.0 89.1 90.3  PLT 322 254 256 245 206     Cardiac Enzymes: No results for input(s): CKTOTAL, CKMB, CKMBINDEX, TROPONINI in the last 168 hours.  BNP: Invalid input(s): POCBNP  CBG: Recent Labs  Lab 06/24/20 1647 06/24/20 1911 06/24/20 2302 06/25/20 0329 06/25/20 0758  GLUCAP 165* 123* 168* 138* 66*     Microbiology: Results for orders placed or performed during the hospital encounter of 06/04/20  Blood culture (single)     Status: Abnormal   Collection Time: 06/04/20  5:18 PM   Specimen: BLOOD  Result Value Ref Range Status   Specimen Description   Final    BLOOD BLOOD RIGHT HAND Performed at [facility], [address], [place], [place] [address]    Special Requests   Final    BOTTLES DRAWN AEROBIC AND ANAEROBIC Blood Culture adequate volume Performed at [facility], [address] [place], [place] [address]  Culture  Setup Time   Final    GRAM POSITIVE COCCI IN BOTH AEROBIC AND ANAEROBIC BOTTLES CRITICAL RESULT CALLED TO, READ BACK BY AND VERIFIED WITH: Elvera Maria [id number] 06/06/2020 DLB Performed at [facility], [address] [place], [place] [address]    Culture (A)  Final    STAPHYLOCOCCUS EPIDERMIDIS THE SIGNIFICANCE OF ISOLATING THIS ORGANISM FROM A SINGLE SET OF BLOOD CULTURES WHEN MULTIPLE SETS ARE DRAWN IS UNCERTAIN. PLEASE NOTIFY THE MICROBIOLOGY DEPARTMENT WITHIN ONE WEEK IF SPECIATION AND SENSITIVITIES ARE REQUIRED. MICROCOCCUS SPECIES Standardized susceptibility testing for this organism is not available. KOCURIA RHIZOPHILIA Performed at [facility], [address] [place], [place] [address]    Report Status 06/10/2020 FINAL   Final  Resp Panel by RT-PCR (Flu A&B, Covid) Nasopharyngeal Swab     Status: None   Collection Time: 06/04/20  5:18 PM   Specimen: Nasopharyngeal Swab; Nasopharyngeal(NP) swabs in vial transport medium  Result Value Ref Range Status   SARS Coronavirus 2 by RT PCR NEGATIVE NEGATIVE Final    Comment: (NOTE) SARS-CoV-2 target nucleic acids are NOT DETECTED.  The SARS-CoV-2 RNA is generally detectable in upper respiratory specimens during the acute phase of infection. The lowest concentration of SARS-CoV-2 viral copies this assay can detect is 138 copies/mL. A negative result does not preclude SARS-Cov-2 infection and should not be used as the sole basis for treatment or other patient management decisions. A negative result may occur with  improper specimen collection/handling, submission of specimen other than nasopharyngeal swab, presence of viral mutation(s) within the areas targeted by this assay, and inadequate number of viral copies(<138 copies/mL). A negative result must be combined with clinical observations, patient history, and epidemiological information. The expected result is Negative.  Fact Sheet for Patients:  EntrepreneurPulse.com.au  Fact Sheet for Healthcare Providers:  IncredibleEmployment.be  This test is no t yet approved or cleared by the [place] FDA and  has been authorized for detection and/or diagnosis of SARS-CoV-2 by FDA under an Emergency Use Authorization (EUA). This EUA will remain  in effect (meaning this test can be used) for the duration of the COVID-19 declaration under Section 564(b)(1) of the Act, 21 U.S.C.section 360bbb-3(b)(1), unless the authorization is terminated  or revoked sooner.       Influenza A by PCR NEGATIVE NEGATIVE Final   Influenza B by PCR NEGATIVE NEGATIVE Final    Comment: (NOTE) The Xpert Xpress SARS-CoV-2/FLU/RSV plus assay is intended as an aid in the diagnosis of influenza from  Nasopharyngeal swab specimens and should not be used as a sole basis for treatment. Nasal washings and aspirates are unacceptable for Xpert Xpress SARS-CoV-2/FLU/RSV testing.  Fact Sheet for Patients: EntrepreneurPulse.com.au  Fact Sheet for Healthcare Providers: IncredibleEmployment.be  This test is not yet approved or cleared by the [place] FDA and has been authorized for detection and/or diagnosis of SARS-CoV-2 by FDA under an Emergency Use Authorization (EUA). This EUA will remain in effect (meaning this test can be used) for the duration of the COVID-19 declaration under Section 564(b)(1) of the Act, 21 U.S.C. section 360bbb-3(b)(1), unless the authorization is terminated or revoked.  Performed at [facility], [address] [place], [place] [address]   Blood Culture ID Panel (Reflexed)     Status: Abnormal   Collection Time: 06/04/20  5:18 PM  Result Value Ref Range Status   Enterococcus faecalis NOT DETECTED NOT DETECTED Final   Enterococcus Faecium NOT DETECTED NOT DETECTED Final   Listeria  monocytogenes NOT DETECTED NOT DETECTED Final   Staphylococcus species DETECTED (A) NOT DETECTED Final    Comment: CRITICAL RESULT CALLED TO, READ BACK BY AND VERIFIED WITH: [facility] MITCHELL [id number] 06/06/2020 DLB    Staphylococcus aureus (BCID) NOT DETECTED NOT DETECTED Final   Staphylococcus epidermidis DETECTED (A) NOT DETECTED Final    Comment: CRITICAL RESULT CALLED TO, READ BACK BY AND VERIFIED WITH: Elvera Maria [id number] 06/06/2020 DLB    Staphylococcus lugdunensis NOT DETECTED NOT DETECTED Final   Streptococcus species NOT DETECTED NOT DETECTED Final   Streptococcus agalactiae NOT DETECTED NOT DETECTED Final   Streptococcus pneumoniae NOT DETECTED NOT DETECTED Final   Streptococcus pyogenes NOT DETECTED NOT DETECTED Final   A.calcoaceticus-baumannii NOT DETECTED NOT DETECTED Final   Bacteroides fragilis NOT DETECTED NOT DETECTED  Final   Enterobacterales NOT DETECTED NOT DETECTED Final   Enterobacter cloacae complex NOT DETECTED NOT DETECTED Final   Escherichia coli NOT DETECTED NOT DETECTED Final   Klebsiella aerogenes NOT DETECTED NOT DETECTED Final   Klebsiella oxytoca NOT DETECTED NOT DETECTED Final   Klebsiella pneumoniae NOT DETECTED NOT DETECTED Final   Proteus species NOT DETECTED NOT DETECTED Final   Salmonella species NOT DETECTED NOT DETECTED Final   Serratia marcescens NOT DETECTED NOT DETECTED Final   Haemophilus influenzae NOT DETECTED NOT DETECTED Final   Neisseria meningitidis NOT DETECTED NOT DETECTED Final   Pseudomonas aeruginosa NOT DETECTED NOT DETECTED Final   Stenotrophomonas maltophilia NOT DETECTED NOT DETECTED Final   Candida albicans NOT DETECTED NOT DETECTED Final   Candida auris NOT DETECTED NOT DETECTED Final   Candida glabrata NOT DETECTED NOT DETECTED Final   Candida krusei NOT DETECTED NOT DETECTED Final   Candida parapsilosis NOT DETECTED NOT DETECTED Final   Candida tropicalis NOT DETECTED NOT DETECTED Final   Cryptococcus neoformans/gattii NOT DETECTED NOT DETECTED Final   Methicillin resistance mecA/C NOT DETECTED NOT DETECTED Final    Comment: Performed at [facility], [address] [place], [place] [address]  Blood culture (single)     Status: None   Collection Time: 06/04/20  9:48 PM   Specimen: BLOOD  Result Value Ref Range Status   Specimen Description BLOOD BLOOD RIGHT ARM  Final   Special Requests   Final    BOTTLES DRAWN AEROBIC AND ANAEROBIC Blood Culture adequate volume   Culture   Final    NO GROWTH 5 DAYS Performed at [facility], [address], [place], [place] [address]    Report Status 06/09/2020 FINAL  Final  Urine Culture     Status: None   Collection Time: 06/06/20  2:37 PM   Specimen: Urine, Random  Result Value Ref Range Status   Specimen Description   Final    URINE, RANDOM Performed at [facility], [address] [place], [place] [address]    Special Requests   Final    NONE Performed at [facility], [address], [place], [place] [address]    Culture   Final    NO GROWTH Performed at [facility], [address], [place],  [address]    Report Status 06/08/2020 FINAL  Final  MRSA PCR Screening     Status: None   Collection Time: 06/06/20  2:40 PM   Specimen: Nasopharyngeal  Result Value Ref Range Status   MRSA by PCR NEGATIVE NEGATIVE Final    Comment:        The GeneXpert MRSA Assay (FDA approved for NASAL  specimens only), is one component of a comprehensive MRSA colonization surveillance program. It is not intended to diagnose MRSA infection nor to guide or monitor treatment for MRSA infections. Performed at [facility], [place], [place] [address]   Acid Fast Smear (AFB)     Status: None   Collection Time: 06/06/20  6:30 PM   Specimen: Bronchoalveolar Lavage; Sputum  Result Value Ref Range Status   AFB Specimen Processing Concentration  Final   Acid Fast Smear Negative  Final    Comment: (NOTE) Performed At: [facility] [place], [place] [id number] Rush Farmer MD [id number]    Source (AFB) BRONCHIAL ALVEOLAR LAVAGE  Corrected    Comment: Performed at [facility], [address] [place], [place] [address] CORRECTED ON 06/02 AT 2239: PREVIOUSLY REPORTED AS SPUTUM   Culture, Respiratory w Gram Stain     Status: None   Collection Time: 06/06/20  6:30 PM   Specimen: Bronchoalveolar Lavage  Result Value Ref Range Status   Specimen Description   Final    BRONCHIAL ALVEOLAR LAVAGE Performed at [facility], [address], [place], [place] [address]    Special Requests   Final    NONE Performed at [facility], [address] [place], [place] [address]    Gram Stain   Final    FEW WBC PRESENT,BOTH PMN AND MONONUCLEAR NO ORGANISMS SEEN    Culture    Final    NO GROWTH 3 DAYS Performed at [facility], [place] [address], [place], [place] [address]    Report Status 06/09/2020 FINAL  Final  Fungus Culture With Stain     Status: None (Preliminary result)   Collection Time: 06/06/20  6:30 PM  Result Value Ref Range Status   Fungus Stain Final report  Final    Comment: (NOTE) Performed At: [facility] [place], [place] [id number] Rush Farmer MD [id number]    Fungus (Mycology) Culture PENDING  Incomplete   Fungal Source BRONCHIAL ALVEOLAR LAVAGE  Final    Comment: Performed at [facility], [place] [address], [place], [place] [address]  Fungus Culture Result     Status: None   Collection Time: 06/06/20  6:30 PM  Result Value Ref Range Status   Result 1 Comment  Final    Comment: (NOTE) KOH/Calcofluor preparation:  no fungus observed. Performed At: [facility] [place], [place] [id number] Rush Farmer MD FG:[id number]   Respiratory (~20 pathogens) panel by PCR     Status: None   Collection Time: 06/07/20  5:50 AM   Specimen: Nasopharyngeal Swab; Respiratory  Result Value Ref Range Status   Adenovirus NOT DETECTED NOT DETECTED Final   Coronavirus 229E NOT DETECTED NOT DETECTED Final    Comment: (NOTE) The Coronavirus on the Respiratory Panel, DOES NOT test for the novel  Coronavirus (2019 nCoV)    Coronavirus HKU1 NOT DETECTED NOT DETECTED Final   Coronavirus NL63 NOT DETECTED NOT DETECTED Final   Coronavirus OC43 NOT DETECTED NOT DETECTED Final   Metapneumovirus NOT DETECTED NOT DETECTED Final   Rhinovirus / Enterovirus NOT DETECTED NOT DETECTED Final   Influenza A NOT DETECTED NOT DETECTED Final   Influenza B NOT DETECTED NOT DETECTED Final   Parainfluenza Virus 1 NOT DETECTED NOT DETECTED Final   Parainfluenza Virus 2 NOT DETECTED NOT DETECTED Final   Parainfluenza Virus 3 NOT DETECTED NOT DETECTED Final   Parainfluenza Virus 4  NOT DETECTED NOT DETECTED Final    Respiratory Syncytial Virus NOT DETECTED NOT DETECTED Final   Bordetella pertussis NOT DETECTED NOT DETECTED Final   Bordetella Parapertussis NOT DETECTED NOT DETECTED Final   Chlamydophila pneumoniae NOT DETECTED NOT DETECTED Final   Mycoplasma pneumoniae NOT DETECTED NOT DETECTED Final    Comment: Performed at [facility], [place] [address], [place], [place] [address]  Culture, Respiratory w Gram Stain     Status: None   Collection Time: 06/18/20  4:43 PM   Specimen: Tracheal Aspirate; Respiratory  Result Value Ref Range Status   Specimen Description   Final    TRACHEAL ASPIRATE Performed at [facility], [address], [place], [place] [address]    Special Requests   Final    NONE Performed at [facility], [address] [place], [place] [address]    Gram Stain   Final    MODERATE WBC PRESENT,BOTH PMN AND MONONUCLEAR FEW YEAST RARE GRAM NEGATIVE RODS Performed at [facility], [place] [address] [place], [place] [address]    Culture FEW CANDIDA ALBICANS  Final   Report Status 06/21/2020 FINAL  Final    Coagulation Studies: No results for input(s): LABPROT, INR in the last 72 hours.  Urinalysis: No results for input(s): COLORURINE, LABSPEC, PHURINE, GLUCOSEU, HGBUR, BILIRUBINUR, KETONESUR, PROTEINUR, UROBILINOGEN, NITRITE, LEUKOCYTESUR in the last 72 hours.  Invalid input(s): APPERANCEUR    Imaging: No results found.   Medications:    sodium chloride Stopped (06/22/20 1614)   feeding supplement (NEPRO CARB STEADY) 55 mL/hr at 06/24/20 1516    amLODipine  5 mg Per Tube Daily   chlorhexidine gluconate (MEDLINE KIT)  15 mL Mouth Rinse BID   Chlorhexidine Gluconate Cloth  6 each Topical Daily   cloNIDine  0.3 mg Transdermal Weekly   feeding supplement (PROSource TF)  45 mL Per Tube Daily   free water  100 mL Per Tube Q4H   gabapentin  100 mg Per Tube Q8H   heparin injection (subcutaneous)  5,000 Units Subcutaneous Q8H   insulin aspart   0-20 Units Subcutaneous Q4H   insulin aspart  3 Units Subcutaneous Q4H   insulin glargine  20 Units Subcutaneous Daily   metoprolol tartrate  25 mg Per Tube BID   multivitamin  1 tablet Per Tube QHS   nutrition supplement (JUVEN)  1 packet Per Tube BID   pantoprazole (PROTONIX) IV  40 mg Intravenous Q24H   prazosin  4 mg Per Tube QHS   QUEtiapine  12.5 mg Per Tube BID   sodium chloride flush  10-40 mL Intracatheter Q12H   traZODone  150 mg Per Tube QHS   sodium chloride, heparin sodium (porcine), heparin sodium (porcine), hydrALAZINE, labetalol, ondansetron **OR** ondansetron (ZOFRAN) IV, oxyCODONE-acetaminophen, prazosin, sodium chloride flush  Assessment/ Plan:  Mr. Jaqualyn Maillet Brooke Bonito. is a 60 y.o. white male with hypertension, PTSD, diabetes mellitus type II, migraines, GERD, neurogenic bladder, history of transverse myelitis, who is admitted to [facility] on 06/04/2020 for Sepsis ([facility]) [A41.9] Altered mental status, unspecified altered mental status type [R41.82] Community acquired pneumonia, unspecified laterality [J18.9]  First dialysis was 6/6.   Acute kidney injury: admitting creatinine of 1.06 with GFR >60. Underlying proteinuria, hematuria, glycosuria consistent with diabetic nephropathy. Patient was also taking ibuprofen regularly at home. Patient with the [place], baseline creatinine not available. Acute kidney injury secondary to sepsis, ATN, IV contrast. Serologic work up negative. First hemodialysis treatment on June 6. Seen and examined  on hemodialysis treatment. Hold dialysis tomorrow and monitor for recovery.   Hypertension: elevated readings, Current regimen of prazosin, amlodipine and clonidine patch. Home regimen of lisinopril and metoprolol, both are on hold.   Diabetes mellitus type II with renal manifestations: noninsulin dependent. holding home metformin. Hemoglobin A1c of 7.4%.   Altered mental status: appreciate neurology input.   Overall prognosis is poor. Not a  candidate for further dialysis. Appreciate Palliative care input   LOS: 21 Lota Leamer 6/21/202211:21 AM

## 2020-06-25 NOTE — Progress Notes (Signed)
[facility] Progress Note  Patient: Wyatt Ball.    HLK:[id number]  DOA: 06/04/2020     Date of Service: the patient was seen and examined on 06/25/2020  Brief hospital course: Past medical history of HTN, type II DM, PTSD, migraine, neurogenic bladder, remote history of transverse myelitis, GERD.  Presents with complaints of confusion found to have septic shock, unknown organism with acute kidney injury, COPD exacerbation requiring intubation and ICU stay. 5/21 admitted for sepsis and pneumonia. 6/2 transferred to ICU, intubated underwent bronchoscopy 6/3 self extubated followed by reintubated 6/7 HD started 6/15 extubated  6/19 transferred to [facility]. Neurology, nephrology, urology and infectious disease consulted.  Currently plan is monitor for improvement in mentation, continue to engage with the family regarding goals of care and monitor for renal recovery.  Assessment and Plan: 1.  Septic shock secondary to community-acquired pneumonia, present on admission. Acute hypoxic respiratory failure, POA COPD exacerbation Presents with confusion and fevers. Chest x-ray showed pneumonia. Mentation progressively worsening as well as respiratory distress. Required ICU level care with intubation. Bronchoscopy was performed for cultures. So far blood cultures, bronchoscopy cultures, sputum cultures are negative for any significant growth other than sputum culture growing Candida which appears to be more oropharyngeal. ID was consulted. Extensive work-up was performed due to persistent fever. Nothing significantly positive. Patient has completed 8 days of azithromycin and 3 days of cephalosporin +5 days of IV meropenem, followed by 7 days of Levaquin and doxycycline. As of right now the patient does not appear to have any fever right now we will monitor.  No leukocytosis. Respiratory status is improved and patient is currently on room air.  2.  Acute metabolic  encephalopathy Agitation PTSD Etiology of the agitation is not clear. Neurology was consulted. EEG negative Metabolic work-up also significantly negative.  As of right now they are expecting that there will be gradual improvement although it will take time based on evaluation. I will initiate some of his home medication for his agitation and monitor response. Continue prazosin.  3.  Acute kidney injury  Hypernatremia, hyperkalemia, hyperphosphatemia Renal function worsen progressively from admission. Suspect this is ATN above diabetic nephropathy like picture. Currently receiving hemodialysis since June 6. Nephrology believes that the patient is not a good candidate for outpatient hemodialysis. Palliative care consulted since without hemodialysis patient's prognosis appears to be poor.  4.  Chronic pain syndrome with use of opioids Resuming some of his home medication.  Will monitor.  5.  Type 2 diabetes mellitus Blood sugars are well controlled right now. Continue sliding scale insulin.  6.  Hypertension Blood pressure now stable. Somewhat elevated. Currently on amlodipine, clonidine patch.  We will also initiate Lopressor.  Also on scheduled prazosin for PTSD.  7. neurogenic bladder with history of transverse myelitis Frequent UTI Currently has a Foley catheter.  Will monitor.  8. Obesity Placing the patient at high risk of poor outcome. May 1 have undiagnosed sleep apnea. Body mass index is 30.33 kg/m.  Nutrition Problem: Inadequate oral intake Etiology: inability to eat Interventions: Interventions: Prostat, Tube feeding  9.  Coccyx ulcer, present on admission stage II Continue foam dressing.  10 anemia likely surgery with kidney disease Hemoglobin stable. No active bleeding. Will monitor for now. Resume aspirin as long as remains stable.  11.  Thrombocytopenia Resolved  Pressure Injury 06/09/20 Coccyx Mid Deep Tissue Pressure Injury - Purple or maroon  localized area of discolored intact skin or blood-filled blister due to damage of underlying soft tissue  from pressure and/or shear. (Active)  06/09/20 2014  Location: Coccyx  Location Orientation: Mid  Staging: Deep Tissue Pressure Injury - Purple or maroon localized area of discolored intact skin or blood-filled blister due to damage of underlying soft tissue from pressure and/or shear.  Wound Description (Comments):   Present on Admission: No     Pressure Injury 06/23/20 Coccyx Medial Unstageable - Full thickness tissue loss in which the base of the injury is covered by slough (yellow, tan, gray, green or brown) and/or eschar (tan, brown or black) in the wound bed. (Active)  06/23/20 1400  Location: Coccyx  Location Orientation: Medial  Staging: Unstageable - Full thickness tissue loss in which the base of the injury is covered by slough (yellow, tan, gray, green or brown) and/or eschar (tan, brown or black) in the wound bed.  Wound Description (Comments):   Present on Admission: Yes     Pressure Injury 06/23/20 Coccyx Posterior;Mid Stage 2 -  Partial thickness loss of dermis presenting as a shallow open injury with a red, pink wound bed without slough. (Active)  06/23/20 1400  Location: Coccyx  Location Orientation: Posterior;Mid  Staging: Stage 2 -  Partial thickness loss of dermis presenting as a shallow open injury with a red, pink wound bed without slough.  Wound Description (Comments):   Present on Admission: Yes     Diet: NPO.  On feeding supplements per NG tube DVT Prophylaxis:   heparin injection 5,000 Units Start: 06/10/20 2200    Advance goals of care discussion: Full code  Family Communication: no family was present at bedside, at the time of interview.   Disposition:  Status is: Inpatient  Remains inpatient appropriate because:IV treatments appropriate due to intensity of illness or inability to take PO and Inpatient level of care appropriate due to severity of  illness  Dispo: The patient is from: Home              Anticipated d/c is to: to be determined              Patient currently is not medically stable to d/c.   Difficult to place patient No  Subjective: Agitated, only answers help to any question.  No nausea no vomiting.  Pulled his NG tube out.  No other acute events overnight.  Physical Exam:  General: Appear in moderate distress, no Rash; Oral Mucosa shows some whitish collection. no Abnormal Neck Mass Or lumps, Conjunctiva normal  Cardiovascular: S1 and S2 Present, no Murmur, Respiratory: good respiratory effort, Bilateral Air entry present and difficult to assess fully but no anterior Crackles, no wheezes Abdomen: Bowel Sound present, Soft and no tenderness Extremities: trace Pedal edema Neurology: alert and not oriented to time, place, and person affect emotionally labile. Difficult to assess focal deficit due to cooperation issues Gait not checked due to patient safety concerns  Vitals:   06/25/20 0331 06/25/20 0758 06/25/20 1132 06/25/20 1646  BP: (!) 144/69 (!) 183/83 132/73 (!) 156/87  Pulse: 98 86 (!) 106 88  Resp: _0 Temp: 98.5 F (36.9 C) 98.3 F (36.8 C) 98.1 F (36.7 C) 98.9 F (37.2 C)  TempSrc: Oral Oral Oral Oral  SpO2: 100% 97% 94% 98%  Weight:      Height:        Intake/Output Summary (Last 24 hours) at 06/25/2020 1742 Last data filed at 06/25/2020 1136 Gross per 24 hour  Intake 240 ml  Output 1850 ml  Net -1610 ml  Filed Weights   06/19/20 0500 06/20/20 0500 06/21/20 0530  Weight: 98.1 kg 98.1 kg 85.2 kg    Data Reviewed: I have personally reviewed and interpreted daily labs, tele strips, imaging. I reviewed all nursing notes, pharmacy notes, vitals, pertinent old records I have discussed plan of care as described above with RN and patient/family.  CBC: Recent Labs  Lab 06/21/20 0606 06/22/20 0542 06/23/20 0454 06/24/20 0504 06/25/20 0558  WBC 8.5 7.0 8.1 7.1 7.5  NEUTROABS  7.1 5.4 6.5 5.5 5.2  HGB 9.6* 9.1* 8.8* 8.9* 8.5*  HCT 28.9* 26.6* 27.2* 27.0* 25.2*  MCV 88.7 88.4 91.0 89.1 90.3  PLT 322 254 256 245 497   Basic Metabolic Panel: Recent Labs  Lab 06/20/20 0417 06/21/20 0606 06/22/20 0542 06/23/20 0454 06/24/20 0504 06/25/20 0558  NA 144 149* 142 146* 146*  --   K 4.4 4.3 3.7 3.5 3.8  --   CL 106 110 105 109 110  --   CO2 22 21* _0 --   GLUCOSE 119* 134* 170* 175* 251*  --   BUN 112* 131* 91* 126* 130*  --   CREATININE 4.43* 5.19* 3.70* 4.52* 4.94*  --   CALCIUM 8.5* 8.9 8.7* 9.3 9.7  --   MG 2.5* 2.8* 2.2 2.5* 2.5* 2.4  PHOS 10.6* 11.9* 9.3* 8.8* 8.0* 6.6*    Studies: DG Abd 1 View  Result Date: 06/25/2020 CLINICAL DATA:  NG tube placement EXAM: ABDOMEN - 1 VIEW COMPARISON:  None. FINDINGS: Nasogastric tube has been advanced with tip and side port overlying the stomach. IMPRESSION: Nasogastric tube tip and side port overlie the stomach. Electronically Signed   By: Maurine Simmering   On: 06/25/2020 14:27   EEG adult  Result Date: 06/25/2020 Derek Jack, MD     06/25/2020  3:04 PM Routine EEG Report Delrae Cleophas Folkert Brooke Bonito. is a 60 y.o. male with a history of encephalopathy who is undergoing an EEG to evaluate for seizures. Report: This EEG was acquired with electrodes placed according to the International 10-20 electrode system (including Fp1, Fp2, F3, F4, C3, C4, P3, P4, O1, O2, T3, T4, T5, T6, A1, A2, Fz, Cz, Pz). The following electrodes were missing or displaced: none. The occipital dominant rhythm was 7-8 Hz. This activity is reactive to stimulation. Drowsiness was manifested by background fragmentation; deeper stages of sleep were not identified. There is focal slowing over the bifrontotemporal regions. There were no interictal epileptiform discharges. There were no electrographic seizures identified. Photic stimulation and hyperventilation were not performed. Impression: This EEG was obtained while awake and drowsy and is abnormal due  to mild diffuse slowing and superimposed focal slowing over the bifrontotemporal regions indicating both global and focal cerebral dysfunction. There were no epileptiform abnormalities seen in this recording. Su Monks, MD [facility] [phone] If 7pm- 7am, please page neurology on call as listed in [place].    Scheduled Meds:  amLODipine  5 mg Per Tube Daily   chlorhexidine gluconate (MEDLINE KIT)  15 mL Mouth Rinse BID   Chlorhexidine Gluconate Cloth  6 each Topical Daily   cloNIDine  0.3 mg Transdermal Weekly   feeding supplement (PROSource TF)  45 mL Per Tube Daily   free water  100 mL Per Tube Q4H   gabapentin  100 mg Per Tube Q8H   heparin injection (subcutaneous)  5,000 Units Subcutaneous Q8H   insulin aspart  0-20 Units Subcutaneous Q4H   insulin aspart  3 Units Subcutaneous Q4H  insulin glargine  20 Units Subcutaneous Daily   metoprolol tartrate  25 mg Per Tube BID   multivitamin  1 tablet Per Tube QHS   nutrition supplement (JUVEN)  1 packet Per Tube BID   pantoprazole (PROTONIX) IV  40 mg Intravenous Q24H   prazosin  4 mg Per Tube QHS   QUEtiapine  12.5 mg Per Tube BID   sodium chloride flush  10-40 mL Intracatheter Q12H   traZODone  150 mg Per Tube QHS   Continuous Infusions:  sodium chloride Stopped (06/22/20 1614)   feeding supplement (NEPRO CARB STEADY) 1,000 mL (06/25/20 1522)   PRN Meds: sodium chloride, haloperidol lactate, heparin sodium (porcine), heparin sodium (porcine), hydrALAZINE, labetalol, ondansetron **OR** ondansetron (ZOFRAN) IV, oxyCODONE-acetaminophen, prazosin, sodium chloride flush  Time spent: 35 minutes  Author: Berle Mull, MD [facility] 06/25/2020 5:42 PM  To reach On-call, see care teams to locate the attending and reach out via [email or web address]. Between 7PM-7AM, please contact night-coverage If you still have difficulty reaching the attending provider, please page the [facility] (Director on Call) for [facility] on  amion for assistance.

## 2020-06-25 NOTE — Procedures (Signed)
Routine EEG Report  Wyatt Ball. is a 60 y.o. male with a history of encephalopathy who is undergoing an EEG to evaluate for seizures.  Report: This EEG was acquired with electrodes placed according to the International 10-20 electrode system (including Fp1, Fp2, F3, F4, C3, C4, P3, P4, O1, O2, T3, T4, T5, T6, A1, A2, Fz, Cz, Pz). The following electrodes were missing or displaced: none.  The occipital dominant rhythm was 7-8 Hz. This activity is reactive to stimulation. Drowsiness was manifested by background fragmentation; deeper stages of sleep were not identified. There is focal slowing over the bifrontotemporal regions. There were no interictal epileptiform discharges. There were no electrographic seizures identified. Photic stimulation and hyperventilation were not performed.  Impression: This EEG was obtained while awake and drowsy and is abnormal due to mild diffuse slowing and superimposed focal slowing over the bifrontotemporal regions indicating both global and focal cerebral dysfunction. There were no epileptiform abnormalities seen in this recording.  Wyatt Neighbors, MD [facility] [phone]  If 7pm- 7am, please page neurology on call as listed in AMION.

## 2020-06-26 ENCOUNTER — Encounter: Payer: Self-pay | Admitting: Internal Medicine

## 2020-06-26 DIAGNOSIS — Z515 Encounter for palliative care: Secondary | ICD-10-CM | POA: Diagnosis not present

## 2020-06-26 DIAGNOSIS — N179 Acute kidney failure, unspecified: Secondary | ICD-10-CM | POA: Diagnosis not present

## 2020-06-26 DIAGNOSIS — J189 Pneumonia, unspecified organism: Secondary | ICD-10-CM | POA: Diagnosis not present

## 2020-06-26 DIAGNOSIS — Z7189 Other specified counseling: Secondary | ICD-10-CM | POA: Diagnosis not present

## 2020-06-26 DIAGNOSIS — G9341 Metabolic encephalopathy: Secondary | ICD-10-CM | POA: Diagnosis not present

## 2020-06-26 LAB — COMPREHENSIVE METABOLIC PANEL
ALT: 68 U/L — ABNORMAL HIGH (ref 0–44)
AST: 32 U/L (ref 15–41)
Albumin: 2.8 g/dL — ABNORMAL LOW (ref 3.5–5.0)
Alkaline Phosphatase: 82 U/L (ref 38–126)
Anion gap: 11 (ref 5–15)
BUN: 124 mg/dL — ABNORMAL HIGH (ref 8–23)
CO2: 28 mmol/L (ref 22–32)
Calcium: 10.2 mg/dL (ref 8.9–10.3)
Chloride: 104 mmol/L (ref 98–111)
Creatinine, Ser: 5.04 mg/dL — ABNORMAL HIGH (ref 0.61–1.24)
GFR, Estimated: 12 mL/min — ABNORMAL LOW (ref >60)
Glucose, Bld: 167 mg/dL — ABNORMAL HIGH (ref 70–99)
Potassium: 4.2 mmol/L (ref 3.5–5.1)
Sodium: 143 mmol/L (ref 135–145)
Total Bilirubin: 0.6 mg/dL (ref 0.3–1.2)
Total Protein: 6.4 g/dL — ABNORMAL LOW (ref 6.5–8.1)

## 2020-06-26 LAB — CBC WITH DIFFERENTIAL/PLATELET
Abs Immature Granulocytes: 0.03 10*3/uL (ref 0.00–0.07)
Basophils Absolute: 0 10*3/uL (ref 0.0–0.1)
Basophils Relative: 0 %
Eosinophils Absolute: 0.4 10*3/uL (ref 0.0–0.5)
Eosinophils Relative: 4 %
HCT: 27 % — ABNORMAL LOW (ref 39.0–52.0)
Hemoglobin: 8.8 g/dL — ABNORMAL LOW (ref 13.0–17.0)
Immature Granulocytes: 0 %
Lymphocytes Relative: 14 %
Lymphs Abs: 1.1 10*3/uL (ref 0.7–4.0)
MCH: 29.6 pg (ref 26.0–34.0)
MCHC: 32.6 g/dL (ref 30.0–36.0)
MCV: 90.9 fL (ref 80.0–100.0)
Monocytes Absolute: 0.8 10*3/uL (ref 0.1–1.0)
Monocytes Relative: 9 %
Neutro Abs: 5.9 10*3/uL (ref 1.7–7.7)
Neutrophils Relative %: 73 %
Platelets: 225 10*3/uL (ref 150–400)
RBC: 2.97 MIL/uL — ABNORMAL LOW (ref 4.22–5.81)
RDW: 14.7 % (ref 11.5–15.5)
WBC: 8.2 10*3/uL (ref 4.0–10.5)
nRBC: 0 % (ref 0.0–0.2)

## 2020-06-26 LAB — GLUCOSE, CAPILLARY
Glucose-Capillary: 168 mg/dL — ABNORMAL HIGH (ref 70–99)
Glucose-Capillary: 180 mg/dL — ABNORMAL HIGH (ref 70–99)
Glucose-Capillary: 173 mg/dL — ABNORMAL HIGH (ref 70–99)
Glucose-Capillary: 139 mg/dL — ABNORMAL HIGH (ref 70–99)
Glucose-Capillary: 147 mg/dL — ABNORMAL HIGH (ref 70–99)

## 2020-06-26 LAB — MAGNESIUM: Magnesium: 2.5 mg/dL — ABNORMAL HIGH (ref 1.7–2.4)

## 2020-06-26 LAB — PHOSPHORUS: Phosphorus: 7.9 mg/dL — ABNORMAL HIGH (ref 2.5–4.6)

## 2020-06-26 NOTE — Progress Notes (Addendum)
Progress Note    Yash [employer]Star.  DUK:[id number] DOB: 11/05/1960  DOA: 06/04/2020 PCP: System, Provider Not In      Brief Narrative:    Medical records reviewed and are as summarized below:  Delrae Audrey Altic Brooke Bonito. is a 60 y.o. male with medical history significant for hypertension, PTSD, diabetes, migraine, GERD, neurogenic bladder secondary to remote history of transverse myelitis requiring self urethral catheterization, frequent UTIs, chronic back pain on opioids, generally independent at baseline.  He usually gets his care at the [facility] hospital.  He was brought to the emergency room because of altered mental status, cough, shortness of breath, fever, vomiting and diarrhea.  He was admitted to the hospital for sepsis secondary to pneumonia.  He was transferred to the ICU for septic shock, AKI, COPD exacerbation.  He was intubated and placed on mechanical ventilation.  5/21 admitted for sepsis and pneumonia. 6/2 transferred to ICU, intubated underwent bronchoscopy 6/3 self extubated followed by reintubated 6/7 HD started 6/15 extubated 6/19 transferred to [facility]. Neurology, nephrology, urology and infectious disease consulted.   Assessment/Plan:   Principal Problem:   CAP (community acquired pneumonia) Active Problems:   Acute metabolic encephalopathy   Sepsis ([place])   Neurogenic bladder   Chronic, continuous use of opioids   Chronic low back pain   Type 2 diabetes mellitus without complication (HCC)   Migraines   Recurrent UTI   History of colon polyps   Urinary retention   Altered mental status   Pressure injury of skin   AKI (acute kidney injury) ([place])   Nutrition Problem: Inadequate oral intake Etiology: inability to eat  Signs/Symptoms: NPO status   Body mass index is 30.65 kg/m.  (Obesity)    Septic shock secondary to community-acquired pneumonia: He completed 8 days of azithromycin, 3 days of cephalosporin plus 5 days of IV meropenem followed by  7 days of Levaquin and doxycycline.  S/p bronchoscopy blood cultures were negative.  No growth on blood cultures as well.  Acute hypoxic respiratory failure: S/p intubation and extubation.  He is tolerating room air.  AKI, hyperphosphatemia: Hemodialysis was initiated on 06/10/2020.  Dialysis catheter has been removed and there is no plan for further hemodialysis.  He is not a good candidate for long-term dialysis.    Acute metabolic encephalopathy, agitation, history of PTSD: Patient has been evaluated by neurologist.  EEG was unremarkable.  Continue psychotropics and processing.  Hypertension: BP is uncontrolled.  Continue antihypertensives.  Stage II coccygeal decubitus ulcer: Present on admission: Continue foam dressing.  Hypernatremia and hyperkalemia: Improved  Other comorbidities include neurogenic bladder with history of transverse myelitis, chronic pain syndrome on opioids   Diet Order             Diet NPO time specified  Diet effective now                      Consultants: Intensivist Urologist Infectious disease Urologist Nephrologist  Procedures: Intubation and bronchoscopy on on 06/06/2020 Central venous catheter insertion on 06/10/2020    Medications:    amLODipine  5 mg Per Tube Daily   chlorhexidine gluconate (MEDLINE KIT)  15 mL Mouth Rinse BID   Chlorhexidine Gluconate Cloth  6 each Topical Daily   cloNIDine  0.3 mg Transdermal Weekly   feeding supplement (PROSource TF)  45 mL Per Tube Daily   free water  100 mL Per Tube Q4H   gabapentin  100 mg Per Tube Q8H  heparin injection (subcutaneous)  5,000 Units Subcutaneous Q8H   insulin aspart  0-20 Units Subcutaneous Q4H   insulin aspart  3 Units Subcutaneous Q4H   insulin glargine  20 Units Subcutaneous Daily   metoprolol tartrate  25 mg Per Tube BID   multivitamin  1 tablet Per Tube QHS   nutrition supplement (JUVEN)  1 packet Per Tube BID   pantoprazole (PROTONIX) IV  40 mg Intravenous Q24H    prazosin  4 mg Per Tube QHS   QUEtiapine  12.5 mg Per Tube BID   sodium chloride flush  10-40 mL Intracatheter Q12H   traZODone  150 mg Per Tube QHS   Continuous Infusions:  sodium chloride Stopped (06/22/20 1614)   feeding supplement (NEPRO CARB STEADY) 55 mL/hr at 06/26/20 0500   fluconazole (DIFLUCAN) IV       Anti-infectives (From admission, onward)    Start     Dose/Rate Route Frequency Ordered Stop   06/26/20 2100  fluconazole (DIFLUCAN) IVPB 50 mg       See Hyperspace for full Linked Orders Report.   50 mg 25 mL/hr over 60 Minutes Intravenous Every 24 hours 06/25/20 1835 07/01/20 2359   06/25/20 2000  fluconazole (DIFLUCAN) IVPB 200 mg       See Hyperspace for full Linked Orders Report.   200 mg 100 mL/hr over 60 Minutes Intravenous  Once 06/25/20 1835 06/25/20 2348   06/14/20 1800  levofloxacin (LEVAQUIN) IVPB 500 mg        500 mg 100 mL/hr over 60 Minutes Intravenous Every 48 hours 06/12/20 1624 06/19/20 0210   06/12/20 1800  levofloxacin (LEVAQUIN) IVPB 750 mg        750 mg 100 mL/hr over 90 Minutes Intravenous  Once 06/11/20 2239 06/12/20 1904   06/11/20 1800  meropenem (MERREM) 500 mg in sodium chloride 0.9 % 100 mL IVPB  Status:  Discontinued        500 mg 200 mL/hr over 30 Minutes Intravenous Every 24 hours 06/10/20 1444 06/11/20 2231   06/10/20 1300  doxycycline (VIBRAMYCIN) 100 mg in sodium chloride 0.9 % 250 mL IVPB  Status:  Discontinued        100 mg 125 mL/hr over 120 Minutes Intravenous Every 12 hours 06/10/20 1149 06/17/20 1704   06/09/20 1015  meropenem (MERREM) 500 mg in sodium chloride 0.9 % 100 mL IVPB        500 mg 200 mL/hr over 30 Minutes Intravenous Every 12 hours 06/09/20 0922 06/11/20 1203   06/08/20 2200  meropenem (MERREM) 1 g in sodium chloride 0.9 % 100 mL IVPB  Status:  Discontinued        1 g 200 mL/hr over 30 Minutes Intravenous Every 12 hours 06/08/20 0845 06/09/20 0922   06/07/20 2200  azithromycin (ZITHROMAX) 500 mg in sodium chloride  0.9 % 250 mL IVPB        500 mg 250 mL/hr over 60 Minutes Intravenous Every 24 hours 06/07/20 1441 06/11/20 2211   06/07/20 1600  ceFEPIme (MAXIPIME) 2 g in sodium chloride 0.9 % 100 mL IVPB  Status:  Discontinued        2 g 200 mL/hr over 30 Minutes Intravenous Every 8 hours 06/07/20 1441 06/07/20 1442   06/07/20 1600  meropenem (MERREM) 1 g in sodium chloride 0.9 % 100 mL IVPB  Status:  Discontinued        1 g 200 mL/hr over 30 Minutes Intravenous Every 8 hours 06/07/20 1442 06/08/20 0845  06/06/20 1630  vancomycin (VANCOREADY) IVPB 1500 mg/300 mL  Status:  Discontinued        1,500 mg 150 mL/hr over 120 Minutes Intravenous  Once 06/06/20 1533 06/06/20 1632   06/05/20 0600  cefTRIAXone (ROCEPHIN) 2 g in sodium chloride 0.9 % 100 mL IVPB  Status:  Discontinued        2 g 200 mL/hr over 30 Minutes Intravenous Every 24 hours 06/04/20 2029 06/07/20 1439   06/04/20 2200  azithromycin (ZITHROMAX) 500 mg in sodium chloride 0.9 % 250 mL IVPB        500 mg 250 mL/hr over 60 Minutes Intravenous Every 24 hours 06/04/20 2029 06/07/20 0003   06/04/20 1915  ceFEPIme (MAXIPIME) 2 g in sodium chloride 0.9 % 100 mL IVPB        2 g 200 mL/hr over 30 Minutes Intravenous  Once 06/04/20 1906 06/04/20 2055   06/04/20 1915  metroNIDAZOLE (FLAGYL) IVPB 500 mg        500 mg 100 mL/hr over 60 Minutes Intravenous  Once 06/04/20 1906 06/04/20 2055   06/04/20 1915  vancomycin (VANCOCIN) IVPB 1000 mg/200 mL premix  Status:  Discontinued        1,000 mg 200 mL/hr over 60 Minutes Intravenous  Once 06/04/20 1906 06/04/20 1912   06/04/20 1915  vancomycin (VANCOREADY) IVPB 1750 mg/350 mL        1,750 mg 175 mL/hr over 120 Minutes Intravenous  Once 06/04/20 1912 06/04/20 2257              Family Communication/Anticipated D/C date and plan/Code Status   DVT prophylaxis: heparin injection 5,000 Units Start: 06/10/20 2200     Code Status: Full Code  Family Communication: None Disposition Plan:     Status is: Inpatient  Remains inpatient appropriate because:Inpatient level of care appropriate due to severity of illness  Dispo: The patient is from: Home              Anticipated d/c is to: SNF              Patient currently is not medically stable to d/c.   Difficult to place patient No           Subjective:   Interval events noted.  He is nonverbal and unable to provide any history.  Objective:    Vitals:   06/26/20 0500 06/26/20 0825 06/26/20 1230 06/26/20 1527  BP:  (!) 175/81 (!) 155/81 (!) 175/76  Pulse:  (!) 105 (!) 106 (!) 109  Resp:  19 20 18   Temp:  98 F (36.7 C) (!) 100.8 F (38.2 C) 98.1 F (36.7 C)  TempSrc:   Axillary   SpO2:  98% 98% 99%  Weight: 86.1 kg     Height:       No data found.   Intake/Output Summary (Last 24 hours) at 06/26/2020 1735 Last data filed at 06/26/2020 1557 Gross per 24 hour  Intake 155 ml  Output 2660 ml  Net -2505 ml   Filed Weights   06/20/20 0500 06/21/20 0530 06/26/20 0500  Weight: 98.1 kg 85.2 kg 86.1 kg    Exam:  GEN: NAD SKIN: No rash EYES: No pallor or icterus ENT: MMM, NG tube in place CV: RRR PULM: CTA B ABD: soft, ND, NT, +BS CNS: Lethargic, nonverbal. EXT: No edema or tenderness GU: Foley catheter draining amber urine     Data Reviewed:   I have personally reviewed following labs and imaging studies:  Labs: Labs show the following:   Basic Metabolic Panel: Recent Labs  Lab 06/21/20 0606 06/22/20 0542 06/23/20 0454 06/24/20 0504 06/25/20 0558 06/26/20 1040  NA 149* 142 146* 146*  --  143  K 4.3 3.7 3.5 3.8  --  4.2  CL 110 105 109 110  --  104  CO2 21* 27 27 28   --  28  GLUCOSE 134* 170* 175* 251*  --  167*  BUN 131* 91* 126* 130*  --  124*  CREATININE 5.19* 3.70* 4.52* 4.94*  --  5.04*  CALCIUM 8.9 8.7* 9.3 9.7  --  10.2  MG 2.8* 2.2 2.5* 2.5* 2.4 2.5*  PHOS 11.9* 9.3* 8.8* 8.0* 6.6* 7.9*   GFR Estimated Creatinine Clearance: 15.8 mL/min (A) (by C-G formula based  on SCr of 5.04 mg/dL (H)). Liver Function Tests: Recent Labs  Lab 06/22/20 0542 06/24/20 0504 06/26/20 1040  AST 35 46* 32  ALT 38 72* 68*  ALKPHOS 66 79 82  BILITOT 0.8 0.7 0.6  PROT 6.2* 6.3* 6.4*  ALBUMIN 2.6* 2.9* 2.8*   Recent Labs  Lab 06/20/20 1516  LIPASE 40  AMYLASE 61   No results for input(s): AMMONIA in the last 168 hours. Coagulation profile No results for input(s): INR, PROTIME in the last 168 hours.  CBC: Recent Labs  Lab 06/22/20 0542 06/23/20 0454 06/24/20 0504 06/25/20 0558 06/26/20 1040  WBC 7.0 8.1 7.1 7.5 8.2  NEUTROABS 5.4 6.5 5.5 5.2 5.9  HGB 9.1* 8.8* 8.9* 8.5* 8.8*  HCT 26.6* 27.2* 27.0* 25.2* 27.0*  MCV 88.4 91.0 89.1 90.3 90.9  PLT 254 256 245 206 225   Cardiac Enzymes: No results for input(s): CKTOTAL, CKMB, CKMBINDEX, TROPONINI in the last 168 hours. BNP (last 3 results) No results for input(s): PROBNP in the last 8760 hours. CBG: Recent Labs  Lab 06/25/20 2338 06/26/20 0447 06/26/20 0822 06/26/20 1232 06/26/20 1529  GLUCAP 173* 168* 180* 173* 139*   D-Dimer: No results for input(s): DDIMER in the last 72 hours. Hgb A1c: No results for input(s): HGBA1C in the last 72 hours. Lipid Profile: No results for input(s): CHOL, HDL, LDLCALC, TRIG, CHOLHDL, LDLDIRECT in the last 72 hours. Thyroid function studies: No results for input(s): TSH, T4TOTAL, T3FREE, THYROIDAB in the last 72 hours.  Invalid input(s): FREET3 Anemia work up: No results for input(s): VITAMINB12, FOLATE, FERRITIN, TIBC, IRON, RETICCTPCT in the last 72 hours. Sepsis Labs: Recent Labs  Lab 06/23/20 0454 06/24/20 0504 06/25/20 0558 06/26/20 1040  WBC 8.1 7.1 7.5 8.2    Microbiology Recent Results (from the past 240 hour(s))  Culture, Respiratory w Gram Stain     Status: None   Collection Time: 06/18/20  4:43 PM   Specimen: Tracheal Aspirate; Respiratory  Result Value Ref Range Status   Specimen Description   Final    TRACHEAL ASPIRATE Performed  at [facility], [address] [place], [place] [address]    Special Requests   Final    NONE Performed at [facility], [address] [place], [place] [address]    Gram Stain   Final    MODERATE WBC PRESENT,BOTH PMN AND MONONUCLEAR FEW YEAST RARE GRAM NEGATIVE RODS Performed at [facility], [place] [address] [place], [place] [address]    Culture FEW CANDIDA ALBICANS  Final   Report Status 06/21/2020 FINAL  Final    Procedures and diagnostic studies:  DG Abd 1 View  Result Date: 06/25/2020 CLINICAL DATA:  NG  tube placement EXAM: ABDOMEN - 1 VIEW COMPARISON:  None. FINDINGS: Nasogastric tube has been advanced with tip and side port overlying the stomach. IMPRESSION: Nasogastric tube tip and side port overlie the stomach. Electronically Signed   By: Maurine Simmering   On: 06/25/2020 14:27   EEG adult  Result Date: 06/25/2020 Derek Jack, MD     06/25/2020  3:04 PM Routine EEG Report Delrae Marcanthony Macmillan Brooke Bonito. is a 60 y.o. male with a history of encephalopathy who is undergoing an EEG to evaluate for seizures. Report: This EEG was acquired with electrodes placed according to the International 10-20 electrode system (including Fp1, Fp2, F3, F4, C3, C4, P3, P4, O1, O2, T3, T4, T5, T6, A1, A2, Fz, Cz, Pz). The following electrodes were missing or displaced: none. The occipital dominant rhythm was 7-8 Hz. This activity is reactive to stimulation. Drowsiness was manifested by background fragmentation; deeper stages of sleep were not identified. There is focal slowing over the bifrontotemporal regions. There were no interictal epileptiform discharges. There were no electrographic seizures identified. Photic stimulation and hyperventilation were not performed. Impression: This EEG was obtained while awake and drowsy and is abnormal due to mild diffuse slowing and superimposed focal slowing over the bifrontotemporal regions indicating both global and focal cerebral dysfunction.  There were no epileptiform abnormalities seen in this recording. Su Monks, MD [facility] [phone] If 7pm- 7am, please page neurology on call as listed in AMION.               LOS: 22 days   Lance Huaracha  [facility]   Pager on [email or web address]. If 7PM-7AM, please contact night-coverage at [email or web address]     06/26/2020, 5:35 PM

## 2020-06-26 NOTE — Progress Notes (Signed)
[facility]  ROUNDING NOTE   Subjective:   Confused. Patient not answering questions. Screaming.   Palliative care consult yesterday.    Objective:  Vital signs in last 24 hours:  Temp:  [98 F (36.7 C)-98.9 F (37.2 C)] 98 F (36.7 C) (06/22 0825) Pulse Rate:  [88-106] 105 (06/22 0825) Resp:  [18-19] 19 (06/22 0825) BP: (132-175)/(73-94) 175/81 (06/22 0825) SpO2:  [94 %-100 %] 98 % (06/22 0825) Weight:  [86.1 kg] 86.1 kg (06/22 0500)  Weight change:  Filed Weights   06/20/20 0500 06/21/20 0530 06/26/20 0500  Weight: 98.1 kg 85.2 kg 86.1 kg    Intake/Output: I/O last 3 completed shifts: In: 395 [P.O.:55; NG/GT:240; IV Piggyback:100] Out: 3510 [Urine:3510]   Intake/Output this shift:  No intake/output data recorded.  Physical Exam: General: NAD, laying in bed  Head: Normocephalic, atraumatic. Moist oral mucosal membranes  Eyes: Anicteric, PERRL  Neck: Supple, trachea midline  Lungs:  Clear to auscultation  Heart: Regular rate and rhythm  Abdomen:  Soft, nontender  Extremities:  no peripheral edema.  Neurologic: Moving all four extremities. Not responsive to verbal stimuli  Skin: No lesions  Access: Left IJ temp HD catheter 6/6  GU Foley    Basic Metabolic Panel: Recent Labs  Lab 06/20/20 0417 06/21/20 0606 06/22/20 0542 06/23/20 0454 06/24/20 0504 06/25/20 0558  NA 144 149* 142 146* 146*  --   K 4.4 4.3 3.7 3.5 3.8  --   CL 106 110 105 109 110  --   CO2 22 21* _0 --   GLUCOSE 119* 134* 170* 175* 251*  --   BUN 112* 131* 91* 126* 130*  --   CREATININE 4.43* 5.19* 3.70* 4.52* 4.94*  --   CALCIUM 8.5* 8.9 8.7* 9.3 9.7  --   MG 2.5* 2.8* 2.2 2.5* 2.5* 2.4  PHOS 10.6* 11.9* 9.3* 8.8* 8.0* 6.6*     Liver Function Tests: Recent Labs  Lab 06/22/20 0542 06/24/20 0504  AST 35 46*  ALT 38 72*  ALKPHOS 66 79  BILITOT 0.8 0.7  PROT 6.2* 6.3*  ALBUMIN 2.6* 2.9*    Recent Labs  Lab 06/20/20 1516  LIPASE 40  AMYLASE 61     No results for input(s): AMMONIA in the last 168 hours.  CBC: Recent Labs  Lab 06/21/20 0606 06/22/20 0542 06/23/20 0454 06/24/20 0504 06/25/20 0558  WBC 8.5 7.0 8.1 7.1 7.5  NEUTROABS 7.1 5.4 6.5 5.5 5.2  HGB 9.6* 9.1* 8.8* 8.9* 8.5*  HCT 28.9* 26.6* 27.2* 27.0* 25.2*  MCV 88.7 88.4 91.0 89.1 90.3  PLT 322 254 256 245 206     Cardiac Enzymes: No results for input(s): CKTOTAL, CKMB, CKMBINDEX, TROPONINI in the last 168 hours.  BNP: Invalid input(s): POCBNP  CBG: Recent Labs  Lab 06/25/20 1645 06/25/20 1938 06/25/20 2338 06/26/20 0447 06/26/20 0822  GLUCAP 118* 120* 173* 168* 180*     Microbiology: Results for orders placed or performed during the hospital encounter of 06/04/20  Blood culture (single)     Status: Abnormal   Collection Time: 06/04/20  5:18 PM   Specimen: BLOOD  Result Value Ref Range Status   Specimen Description   Final    BLOOD BLOOD RIGHT HAND Performed at [facility], [address] [place], [place] [address]    Special Requests   Final    BOTTLES DRAWN AEROBIC AND ANAEROBIC Blood Culture adequate volume Performed at [facility], [place],  [place], [place] [address]    Culture  Setup Time   Final    GRAM POSITIVE COCCI IN BOTH AEROBIC AND ANAEROBIC BOTTLES CRITICAL RESULT CALLED TO, READ BACK BY AND VERIFIED WITH: Elvera Maria [id number] 06/06/2020 DLB Performed at [facility], [address] [place], [place] [address]    Culture (A)  Final    STAPHYLOCOCCUS EPIDERMIDIS THE SIGNIFICANCE OF ISOLATING THIS ORGANISM FROM A SINGLE SET OF BLOOD CULTURES WHEN MULTIPLE SETS ARE DRAWN IS UNCERTAIN. PLEASE NOTIFY THE MICROBIOLOGY DEPARTMENT WITHIN ONE WEEK IF SPECIATION AND SENSITIVITIES ARE REQUIRED. MICROCOCCUS SPECIES Standardized susceptibility testing for this organism is not available. KOCURIA RHIZOPHILIA Performed at [facility], [place] [address], [place], [place] [address]    Report Status  06/10/2020 FINAL  Final  Resp Panel by RT-PCR (Flu A&B, Covid) Nasopharyngeal Swab     Status: None   Collection Time: 06/04/20  5:18 PM   Specimen: Nasopharyngeal Swab; Nasopharyngeal(NP) swabs in vial transport medium  Result Value Ref Range Status   SARS Coronavirus 2 by RT PCR NEGATIVE NEGATIVE Final    Comment: (NOTE) SARS-CoV-2 target nucleic acids are NOT DETECTED.  The SARS-CoV-2 RNA is generally detectable in upper respiratory specimens during the acute phase of infection. The lowest concentration of SARS-CoV-2 viral copies this assay can detect is 138 copies/mL. A negative result does not preclude SARS-Cov-2 infection and should not be used as the sole basis for treatment or other patient management decisions. A negative result may occur with  improper specimen collection/handling, submission of specimen other than nasopharyngeal swab, presence of viral mutation(s) within the areas targeted by this assay, and inadequate number of viral copies(<138 copies/mL). A negative result must be combined with clinical observations, patient history, and epidemiological information. The expected result is Negative.  Fact Sheet for Patients:  EntrepreneurPulse.com.au  Fact Sheet for Healthcare Providers:  IncredibleEmployment.be  This test is no t yet approved or cleared by the [place] FDA and  has been authorized for detection and/or diagnosis of SARS-CoV-2 by FDA under an Emergency Use Authorization (EUA). This EUA will remain  in effect (meaning this test can be used) for the duration of the COVID-19 declaration under Section 564(b)(1) of the Act, 21 U.S.C.section 360bbb-3(b)(1), unless the authorization is terminated  or revoked sooner.       Influenza A by PCR NEGATIVE NEGATIVE Final   Influenza B by PCR NEGATIVE NEGATIVE Final    Comment: (NOTE) The Xpert Xpress SARS-CoV-2/FLU/RSV plus assay is intended as an aid in the diagnosis of  influenza from Nasopharyngeal swab specimens and should not be used as a sole basis for treatment. Nasal washings and aspirates are unacceptable for Xpert Xpress SARS-CoV-2/FLU/RSV testing.  Fact Sheet for Patients: EntrepreneurPulse.com.au  Fact Sheet for Healthcare Providers: IncredibleEmployment.be  This test is not yet approved or cleared by the [place] FDA and has been authorized for detection and/or diagnosis of SARS-CoV-2 by FDA under an Emergency Use Authorization (EUA). This EUA will remain in effect (meaning this test can be used) for the duration of the COVID-19 declaration under Section 564(b)(1) of the Act, 21 U.S.C. section 360bbb-3(b)(1), unless the authorization is terminated or revoked.  Performed at [facility], [address] [place], [place] [address]   Blood Culture ID Panel (Reflexed)     Status: Abnormal   Collection Time: 06/04/20  5:18 PM  Result Value Ref Range Status   Enterococcus faecalis NOT DETECTED NOT DETECTED Final   Enterococcus Faecium NOT DETECTED  NOT DETECTED Final   Listeria monocytogenes NOT DETECTED NOT DETECTED Final   Staphylococcus species DETECTED (A) NOT DETECTED Final    Comment: CRITICAL RESULT CALLED TO, READ BACK BY AND VERIFIED WITH: [facility] MITCHELL 0623 06/06/2020 DLB    Staphylococcus aureus (BCID) NOT DETECTED NOT DETECTED Final   Staphylococcus epidermidis DETECTED (A) NOT DETECTED Final    Comment: CRITICAL RESULT CALLED TO, READ BACK BY AND VERIFIED WITH: Elvera Maria [id number] 06/06/2020 DLB    Staphylococcus lugdunensis NOT DETECTED NOT DETECTED Final   Streptococcus species NOT DETECTED NOT DETECTED Final   Streptococcus agalactiae NOT DETECTED NOT DETECTED Final   Streptococcus pneumoniae NOT DETECTED NOT DETECTED Final   Streptococcus pyogenes NOT DETECTED NOT DETECTED Final   A.calcoaceticus-baumannii NOT DETECTED NOT DETECTED Final   Bacteroides fragilis NOT DETECTED  NOT DETECTED Final   Enterobacterales NOT DETECTED NOT DETECTED Final   Enterobacter cloacae complex NOT DETECTED NOT DETECTED Final   Escherichia coli NOT DETECTED NOT DETECTED Final   Klebsiella aerogenes NOT DETECTED NOT DETECTED Final   Klebsiella oxytoca NOT DETECTED NOT DETECTED Final   Klebsiella pneumoniae NOT DETECTED NOT DETECTED Final   Proteus species NOT DETECTED NOT DETECTED Final   Salmonella species NOT DETECTED NOT DETECTED Final   Serratia marcescens NOT DETECTED NOT DETECTED Final   Haemophilus influenzae NOT DETECTED NOT DETECTED Final   Neisseria meningitidis NOT DETECTED NOT DETECTED Final   Pseudomonas aeruginosa NOT DETECTED NOT DETECTED Final   Stenotrophomonas maltophilia NOT DETECTED NOT DETECTED Final   Candida albicans NOT DETECTED NOT DETECTED Final   Candida auris NOT DETECTED NOT DETECTED Final   Candida glabrata NOT DETECTED NOT DETECTED Final   Candida krusei NOT DETECTED NOT DETECTED Final   Candida parapsilosis NOT DETECTED NOT DETECTED Final   Candida tropicalis NOT DETECTED NOT DETECTED Final   Cryptococcus neoformans/gattii NOT DETECTED NOT DETECTED Final   Methicillin resistance mecA/C NOT DETECTED NOT DETECTED Final    Comment: Performed at [facility], [address] [place], [place] [address]  Blood culture (single)     Status: None   Collection Time: 06/04/20  9:48 PM   Specimen: BLOOD  Result Value Ref Range Status   Specimen Description BLOOD BLOOD RIGHT ARM  Final   Special Requests   Final    BOTTLES DRAWN AEROBIC AND ANAEROBIC Blood Culture adequate volume   Culture   Final    NO GROWTH 5 DAYS Performed at [facility], [address], [place], [place] [address]    Report Status 06/09/2020 FINAL  Final  Urine Culture     Status: None   Collection Time: 06/06/20  2:37 PM   Specimen: Urine, Random  Result Value Ref Range Status   Specimen Description   Final    URINE, RANDOM Performed at [facility], [address] [place], [place] [address]    Special Requests   Final    NONE Performed at [facility], [address], [place], [place] [address]    Culture   Final    NO GROWTH Performed at [facility], [address], [place], [place] [address]    Report Status 06/08/2020 FINAL  Final  MRSA PCR Screening     Status: None   Collection Time: 06/06/20  2:40 PM   Specimen: Nasopharyngeal  Result Value Ref Range Status   MRSA by PCR NEGATIVE NEGATIVE Final    Comment:        The GeneXpert  MRSA Assay (FDA approved for NASAL specimens only), is one component of a comprehensive MRSA colonization surveillance program. It is not intended to diagnose MRSA infection nor to guide or monitor treatment for MRSA infections. Performed at [facility], [place], [place] [address]   Acid Fast Smear (AFB)     Status: None   Collection Time: 06/06/20  6:30 PM   Specimen: Bronchoalveolar Lavage; Sputum  Result Value Ref Range Status   AFB Specimen Processing Concentration  Final   Acid Fast Smear Negative  Final    Comment: (NOTE) Performed At: [facility] [place], [place] [id number] Rush Farmer MD PY:[id number]    Source (AFB) BRONCHIAL ALVEOLAR LAVAGE  Corrected    Comment: Performed at [facility], [address] [place], [place] [address] CORRECTED ON 06/02 AT 2239: PREVIOUSLY REPORTED AS SPUTUM   Culture, Respiratory w Gram Stain     Status: None   Collection Time: 06/06/20  6:30 PM   Specimen: Bronchoalveolar Lavage  Result Value Ref Range Status   Specimen Description   Final    BRONCHIAL ALVEOLAR LAVAGE Performed at [facility], [address], [place], [place] [address]    Special Requests   Final    NONE Performed at [facility], [address] [place], [place] [address]    Gram Stain   Final    FEW WBC PRESENT,BOTH PMN AND MONONUCLEAR NO ORGANISMS SEEN     Culture   Final    NO GROWTH 3 DAYS Performed at [facility], [place] [address] [place], [place] [address]    Report Status 06/09/2020 FINAL  Final  Fungus Culture With Stain     Status: None (Preliminary result)   Collection Time: 06/06/20  6:30 PM  Result Value Ref Range Status   Fungus Stain Final report  Final    Comment: (NOTE) Performed At: [facility] [place], [place] [id number] Rush Farmer MD ST:[id number]    Fungus (Mycology) Culture PENDING  Incomplete   Fungal Source BRONCHIAL ALVEOLAR LAVAGE  Final    Comment: Performed at [facility], [place] [address], [place], [place] [address]  Fungus Culture Result     Status: None   Collection Time: 06/06/20  6:30 PM  Result Value Ref Range Status   Result 1 Comment  Final    Comment: (NOTE) KOH/Calcofluor preparation:  no fungus observed. Performed At: [facility] [place], [place] [id number] Rush Farmer MD GY:[id number]   Respiratory (~20 pathogens) panel by PCR     Status: None   Collection Time: 06/07/20  5:50 AM   Specimen: Nasopharyngeal Swab; Respiratory  Result Value Ref Range Status   Adenovirus NOT DETECTED NOT DETECTED Final   Coronavirus 229E NOT DETECTED NOT DETECTED Final    Comment: (NOTE) The Coronavirus on the Respiratory Panel, DOES NOT test for the novel  Coronavirus (2019 nCoV)    Coronavirus HKU1 NOT DETECTED NOT DETECTED Final   Coronavirus NL63 NOT DETECTED NOT DETECTED Final   Coronavirus OC43 NOT DETECTED NOT DETECTED Final   Metapneumovirus NOT DETECTED NOT DETECTED Final   Rhinovirus / Enterovirus NOT DETECTED NOT DETECTED Final   Influenza A NOT DETECTED NOT DETECTED Final   Influenza B NOT DETECTED NOT DETECTED Final   Parainfluenza Virus 1 NOT DETECTED NOT DETECTED Final   Parainfluenza Virus 2 NOT DETECTED NOT DETECTED Final   Parainfluenza Virus 3 NOT DETECTED NOT DETECTED  Final   Parainfluenza Virus 4 NOT DETECTED NOT  DETECTED Final   Respiratory Syncytial Virus NOT DETECTED NOT DETECTED Final   Bordetella pertussis NOT DETECTED NOT DETECTED Final   Bordetella Parapertussis NOT DETECTED NOT DETECTED Final   Chlamydophila pneumoniae NOT DETECTED NOT DETECTED Final   Mycoplasma pneumoniae NOT DETECTED NOT DETECTED Final    Comment: Performed at [facility], [place] [address] [place], [place] [address]  Culture, Respiratory w Gram Stain     Status: None   Collection Time: 06/18/20  4:43 PM   Specimen: Tracheal Aspirate; Respiratory  Result Value Ref Range Status   Specimen Description   Final    TRACHEAL ASPIRATE Performed at [facility], [address] [place], [place] [address]    Special Requests   Final    NONE Performed at [facility], [address] [place], [place] [address]    Gram Stain   Final    MODERATE WBC PRESENT,BOTH PMN AND MONONUCLEAR FEW YEAST RARE GRAM NEGATIVE RODS Performed at [facility], [place] [address], [place], [place] [address]    Culture FEW CANDIDA ALBICANS  Final   Report Status 06/21/2020 FINAL  Final    Coagulation Studies: No results for input(s): LABPROT, INR in the last 72 hours.  Urinalysis: No results for input(s): COLORURINE, LABSPEC, PHURINE, GLUCOSEU, HGBUR, BILIRUBINUR, KETONESUR, PROTEINUR, UROBILINOGEN, NITRITE, LEUKOCYTESUR in the last 72 hours.  Invalid input(s): APPERANCEUR    Imaging: DG Abd 1 View  Result Date: 06/25/2020 CLINICAL DATA:  NG tube placement EXAM: ABDOMEN - 1 VIEW COMPARISON:  None. FINDINGS: Nasogastric tube has been advanced with tip and side port overlying the stomach. IMPRESSION: Nasogastric tube tip and side port overlie the stomach. Electronically Signed   By: Maurine Simmering   On: 06/25/2020 14:27   EEG adult  Result Date: 06/25/2020 Derek Jack, MD     06/25/2020  3:04 PM Routine EEG Report Delrae Thelmer Gazzola Wyatt Ball. is a 60 y.o. male with a history of encephalopathy who is undergoing an  EEG to evaluate for seizures. Report: This EEG was acquired with electrodes placed according to the International 10-20 electrode system (including Fp1, Fp2, F3, F4, C3, C4, P3, P4, O1, O2, T3, T4, T5, T6, A1, A2, Fz, Cz, Pz). The following electrodes were missing or displaced: none. The occipital dominant rhythm was 7-8 Hz. This activity is reactive to stimulation. Drowsiness was manifested by background fragmentation; deeper stages of sleep were not identified. There is focal slowing over the bifrontotemporal regions. There were no interictal epileptiform discharges. There were no electrographic seizures identified. Photic stimulation and hyperventilation were not performed. Impression: This EEG was obtained while awake and drowsy and is abnormal due to mild diffuse slowing and superimposed focal slowing over the bifrontotemporal regions indicating both global and focal cerebral dysfunction. There were no epileptiform abnormalities seen in this recording. Su Monks, MD [facility] [phone] If 7pm- 7am, please page neurology on call as listed in [place].     Medications:    sodium chloride Stopped (06/22/20 1614)   feeding supplement (NEPRO CARB STEADY) 55 mL/hr at 06/26/20 0500   fluconazole (DIFLUCAN) IV      amLODipine  5 mg Per Tube Daily   chlorhexidine gluconate (MEDLINE KIT)  15 mL Mouth Rinse BID   Chlorhexidine Gluconate Cloth  6 each Topical Daily   cloNIDine  0.3 mg Transdermal Weekly   feeding supplement (PROSource TF)  45 mL Per Tube Daily  free water  100 mL Per Tube Q4H   gabapentin  100 mg Per Tube Q8H   heparin injection (subcutaneous)  5,000 Units Subcutaneous Q8H   insulin aspart  0-20 Units Subcutaneous Q4H   insulin aspart  3 Units Subcutaneous Q4H   insulin glargine  20 Units Subcutaneous Daily   metoprolol tartrate  25 mg Per Tube BID   multivitamin  1 tablet Per Tube QHS   nutrition supplement (JUVEN)  1 packet Per Tube BID   pantoprazole (PROTONIX)  IV  40 mg Intravenous Q24H   prazosin  4 mg Per Tube QHS   QUEtiapine  12.5 mg Per Tube BID   sodium chloride flush  10-40 mL Intracatheter Q12H   traZODone  150 mg Per Tube QHS   sodium chloride, haloperidol lactate, heparin sodium (porcine), heparin sodium (porcine), hydrALAZINE, labetalol, ondansetron **OR** ondansetron (ZOFRAN) IV, oxyCODONE-acetaminophen, prazosin, sodium chloride flush  Assessment/ Plan:  Mr. Edwar Dames Wyatt Ball. is a 60 y.o. white male with hypertension, PTSD, diabetes mellitus type II, migraines, GERD, neurogenic bladder, history of transverse myelitis, who is admitted to [facility] on 06/04/2020 for Sepsis ([place]) [A41.9] Altered mental status, unspecified altered mental status type [R41.82] Community acquired pneumonia, unspecified laterality [J18.9]  First dialysis was 6/6.   Acute kidney injury: admitting creatinine of 1.06 with GFR >60. Underlying proteinuria, hematuria, glycosuria consistent with diabetic nephropathy. Patient was also taking ibuprofen regularly at home. Patient with the [place], baseline creatinine not available. Acute kidney injury secondary to sepsis, ATN, IV contrast. Serologic work up negative. First hemodialysis treatment on June 6. Not a candidate for long term dialysis.   - will remove hemodialysis catheter.   Hypertension: elevated readings, 175/81. Current regimen of prazosin, amlodipine and clonidine patch. Home regimen of lisinopril and metoprolol, both are on hold.   Diabetes mellitus type II with renal manifestations: noninsulin dependent. holding home metformin. Hemoglobin A1c of 7.4%. Glucose readings are at goal.   Altered mental status: appreciate neurology input. Prazosin and quetiapine.   Overall prognosis is poor. Not a candidate for further dialysis. Appreciate Palliative care input   LOS: [place] 6/22/202210:59 AM

## 2020-06-26 NOTE — Progress Notes (Signed)
Occupational Therapy Treatment Patient Details Name: Wyatt Ball. MRN: [id number] DOB: 01/20/1960 Today's Date: 06/26/2020    History of present illness Wyatt Wyatt Ball Hageman. is a 60 y.o. 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  Since hospitalization pt has been intubated, finally extubated 6/15.   OT comments  Pt seen for OT treatment on this date. Upon arrival to room pt semi-fowlers position in bed, appears to be sleeping and does not rouse to VCs. With multimodal cueing, pt is able to open eyes and appears to visually attend to therapist briefly. OT attempts to facilitate oral care with swab/oral gel, and pt closes mouth upon presentation of sponge with oral gel. Pt unable to attempts to grip sponge this date and does not meaningfully engage with therapist. He requires TOTAL assist to complete brief oral swab and hair combing. Pt also requires TOTAL assist to reposition in bed to maximize skin integrity and joint protection this date. Pt does not attempt to actively hold or utilize any of the materials provided for ADLs as described. He does not engage with this author visually for more than ~5 seconds at a time. Very limited to no meaningful participation in ADL tasks at this time. Given pt limited ability to meaningfully participate/engage in OT sessions or progress toward low-level OT goals. OT will sign off at this time. DC recommendation updated to [facility]. Please re-consult if pt cognition improves/he is able to participate in OT services.    Follow Up Recommendations  LTACH    Equipment Recommendations       Recommendations for Other Services       Precautions / Restrictions Precautions Precautions: Fall Restrictions Weight Bearing Restrictions: No       Mobility Bed Mobility               General bed mobility comments: deferred for safety    Transfers                      Balance Overall balance assessment:  (Unable/unsafe to attempt mobility/balance.)                                         ADL either performed or assessed with clinical judgement   ADL Overall ADL's : Needs assistance/impaired                                       General ADL Comments: Pt continues to require TOTAL A for all ADLs at this time, does not appear to meaingfully participate during session despite hand over hand and multimodal cueing t/o. OT attempts to facilitate oral care with swab, hair combing, and face washing. Pt does not attempt to actively hold or utilize any of the materials provided for ADLs as described. He does not engage with this author visually for more than ~5 seconds at a time. Very limited to no meaningful participation in ADL tasks at this time.     Vision Patient Visual Report: No  change from baseline     Perception     Praxis      Cognition Arousal/Alertness: Lethargic Behavior During Therapy: Agitated;Restless Overall Cognitive Status: Difficult to assess                                 General Comments: Pt unable to engage meaningfully with therapist during session. Does not follow VCs, no family/caregivers present to determine baseline.        Exercises Other Exercises Other Exercises: OT facilitates bed-level grooming tasks including oral care (with sponge/oral gel), hair combing, Pt requires TOTAL assist. TOTAL assit to position pillows in bed, pt noted with significant R lateral lean with head slumped forward upon arrival in room. OT positions pillows/towel rolls to provide joint protection and maximize skin integrity.   Shoulder  Instructions       General Comments Pt left with NG tube in place, safety mitts donned at start/end of session    Pertinent Vitals/ Pain       Faces Pain Scale: Hurts a little bit Pain Location: pt states "hurts" as only verbalization during session. Unable to localize or meaningfully communicate during session. Pain Descriptors / Indicators: Grimacing Pain Intervention(s): Limited activity within patient's tolerance;Monitored during session  Home Living                                          Prior Functioning/Environment              Frequency  Min 1X/week        Progress Toward Goals  OT Goals(current goals can now be found in the care plan section)  Progress towards OT goals: Not progressing toward goals - comment (Pt unable to meaningfully participate/make progress toward goals. Will DC from OT services at this time.)  Acute Rehab OT Goals Patient Stated Goal: unable to state OT Goal Formulation: Patient unable to participate in goal setting  Plan Frequency needs to be updated;Discharge plan needs to be updated    Co-evaluation                 AM-PAC OT "6 Clicks" Daily Activity     Outcome Measure   Help from another person eating meals?: Total (NPO) Help from another person taking care of personal grooming?: Total Help from another person toileting, which includes using toliet, bedpan, or urinal?: Total Help from another person bathing (including washing, rinsing, drying)?: Total Help from another person to put on and taking off regular upper body clothing?: Total Help from another person to put on and taking off regular lower body clothing?: Total 6 Click Score: 6    End of Session Equipment Utilized During Treatment: Gait belt;Rolling walker  OT Visit Diagnosis: Other symptoms and signs involving cognitive function   Activity Tolerance Other (comment) (Limited by cognition.)   Patient Left in bed;with call bell/phone within  reach;with bed alarm set;Other (comment)   Nurse Communication Mobility status        Time: 7412-8786 OT Time Calculation (min): 25 min  Charges: OT General Charges $OT Visit: 1 Visit OT Treatments $Self Care/Home Management : 23-37 mins  Rockney Ghee, M.S., OTR/L Ascom: [phone] 06/26/20, 4:17 PM

## 2020-06-26 NOTE — TOC Progression Note (Signed)
Transition of Care (TOC) - Progression Note    Patient Details  Name: Wyatt Ball. MRN: [id number] Date of Birth: Nov 18, 1960  Transition [facility]) CM/SW Contact  Gildardo Griffes, [place] Phone Number: 06/26/2020, 9:29 AM  Clinical Narrative:     CSW to continue to follow for discharge planning, appears per PT and OT that patient unable to participate in therapies in a meaningful way. Doubtful SNF would be appropriate discharge plan for short term rehab if patient continues to decline, notes HD was stopped and palliative involved. Palliative to follow for family needs and identifying appropriate dc plan.   Expected Discharge Plan: Skilled Nursing Facility Barriers to Discharge: Continued Medical Work up  Expected Discharge Plan and Services Expected Discharge Plan: Skilled Nursing Facility In-house Referral: NA   Post Acute Care Choice: Skilled Nursing Facility Living arrangements for the past 2 months: Single Family Home                                       Social Determinants of Health (SDOH) Interventions    Readmission Risk Interventions No flowsheet data found.

## 2020-06-26 NOTE — Progress Notes (Signed)
Palliative:  HPI: 60 y.o. male  with past medical history of hypertension, PTSD, migraines, GERD, neurogenic bladder due to remote history of transverse myelitis admitted on 06/04/2020 with altered mental status due to eft lung pneumonia sepsis requiring intubation. Hospitalization complicated by prolonged vent support, renal failure requiring dialysis, and ongoing encephalopathy.    I met today at Wyatt Ball's bedside but no family present. He is unchanged from yesterday. He opens eyes and looks at me briefly but no further response. He is not following commands or verbal at all during my visit.   I was able to speak with daughter, Wyatt Ball. I expressed my concern for her father's status and barriers to improvement. I updated her on no further plans for dialysis. We discussed that if renal function declines further he would not be a candidate to resume dialysis in his current state and she understands. I discussed with her that he would likely need long term feeding tube. I spoke with SLP who reports that he did not respond at all to food on lip or ice chip in mouth and did not even seem aware of presence of food/ice. He is unable to follow commands to participate with therapy. I explained that I am concerned that he may not improve past his current state and I worry about what this means for his quality of life.   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 She reports that she feels he is making improvement but aware that he has so much further to go and many barriers. At this time she wishes to continue all interventions to prolong life and would want PEG if indicated. She plans to continue these conversations with her uncle and aunt as well.   All questions/concerns addressed. Emotional support provided.   Exam: Minimally responsive - only opens eyes briefly. Reportedly yells "help me" but no further verbal response or  following commands. No distress. Breathing regular, unlabored. Bilateral mittens. Abd soft.   Plan: - Daughter is very hopeful for further improvement. Wishes for all indicated/offeed interventions with no limitations at this time.   [place], NP Palliative Medicine Team Pager [phone] (Please see [email or web address] for schedule) Team Phone [phone]    Greater than 50%  of this time was spent counseling and coordinating care related to the above assessment and plan

## 2020-06-27 DIAGNOSIS — J189 Pneumonia, unspecified organism: Secondary | ICD-10-CM | POA: Diagnosis not present

## 2020-06-27 DIAGNOSIS — R6521 Severe sepsis with septic shock: Secondary | ICD-10-CM | POA: Diagnosis not present

## 2020-06-27 DIAGNOSIS — A419 Sepsis, unspecified organism: Secondary | ICD-10-CM | POA: Diagnosis not present

## 2020-06-27 DIAGNOSIS — N179 Acute kidney failure, unspecified: Secondary | ICD-10-CM | POA: Diagnosis not present

## 2020-06-27 LAB — MAGNESIUM: Magnesium: 2.8 mg/dL — ABNORMAL HIGH (ref 1.7–2.4)

## 2020-06-27 LAB — CBC WITH DIFFERENTIAL/PLATELET
Abs Immature Granulocytes: 0.04 10*3/uL (ref 0.00–0.07)
Basophils Absolute: 0 10*3/uL (ref 0.0–0.1)
Basophils Relative: 0 %
Eosinophils Absolute: 0.3 10*3/uL (ref 0.0–0.5)
Eosinophils Relative: 4 %
HCT: 27.1 % — ABNORMAL LOW (ref 39.0–52.0)
Hemoglobin: 9 g/dL — ABNORMAL LOW (ref 13.0–17.0)
Immature Granulocytes: 1 %
Lymphocytes Relative: 16 %
Lymphs Abs: 1.1 10*3/uL (ref 0.7–4.0)
MCH: 30.2 pg (ref 26.0–34.0)
MCHC: 33.2 g/dL (ref 30.0–36.0)
MCV: 90.9 fL (ref 80.0–100.0)
Monocytes Absolute: 0.6 10*3/uL (ref 0.1–1.0)
Monocytes Relative: 8 %
Neutro Abs: 5 10*3/uL (ref 1.7–7.7)
Neutrophils Relative %: 71 %
Platelets: 230 10*3/uL (ref 150–400)
RBC: 2.98 MIL/uL — ABNORMAL LOW (ref 4.22–5.81)
RDW: 14.8 % (ref 11.5–15.5)
WBC: 7 10*3/uL (ref 4.0–10.5)
nRBC: 0 % (ref 0.0–0.2)

## 2020-06-27 LAB — BASIC METABOLIC PANEL
Anion gap: 11 (ref 5–15)
BUN: 100 mg/dL — ABNORMAL HIGH (ref 8–23)
CO2: 28 mmol/L (ref 22–32)
Calcium: 10.5 mg/dL — ABNORMAL HIGH (ref 8.9–10.3)
Chloride: 105 mmol/L (ref 98–111)
Creatinine, Ser: 5.41 mg/dL — ABNORMAL HIGH (ref 0.61–1.24)
GFR, Estimated: 11 mL/min — ABNORMAL LOW (ref >60)
Glucose, Bld: 195 mg/dL — ABNORMAL HIGH (ref 70–99)
Potassium: 4.1 mmol/L (ref 3.5–5.1)
Sodium: 144 mmol/L (ref 135–145)

## 2020-06-27 LAB — GLUCOSE, CAPILLARY
Glucose-Capillary: 154 mg/dL — ABNORMAL HIGH (ref 70–99)
Glucose-Capillary: 162 mg/dL — ABNORMAL HIGH (ref 70–99)
Glucose-Capillary: 169 mg/dL — ABNORMAL HIGH (ref 70–99)
Glucose-Capillary: 175 mg/dL — ABNORMAL HIGH (ref 70–99)
Glucose-Capillary: 179 mg/dL — ABNORMAL HIGH (ref 70–99)
Glucose-Capillary: 189 mg/dL — ABNORMAL HIGH (ref 70–99)
Glucose-Capillary: 194 mg/dL — ABNORMAL HIGH (ref 70–99)

## 2020-06-27 LAB — PHOSPHORUS: Phosphorus: 8.6 mg/dL — ABNORMAL HIGH (ref 2.5–4.6)

## 2020-06-27 NOTE — Progress Notes (Signed)
[facility]  ROUNDING NOTE   Subjective:   Drowsy. Patient not answering questions.   Palliative care consult yesterday. Daughter hopeful for improvement and request full treatment to continue   Objective:  Vital signs in last 24 hours:  Temp:  [98 F (36.7 C)-99.4 F (37.4 C)] 99.4 F (37.4 C) (06/23 1144) Pulse Rate:  [96-110] 96 (06/23 1144) Resp:  [16-20] 16 (06/23 1144) BP: (145-175)/(64-77) 145/64 (06/23 1144) SpO2:  [95 %-100 %] 96 % (06/23 1144) Weight:  [85.5 kg] 85.5 kg (06/23 0441)  Weight change: -0.6 kg Filed Weights   06/21/20 0530 06/26/20 0500 06/27/20 0441  Weight: 85.2 kg 86.1 kg 85.5 kg    Intake/Output: I/O last 3 completed shifts: In: 205 [P.O.:55; IV Piggyback:150] Out: 2660 [Urine:2660]   Intake/Output this shift:  Total I/O In: 160 [NG/GT:160] Out: 440 [Urine:440]  Physical Exam: General: NAD, laying in bed  Head: Normocephalic, atraumatic. Moist oral mucosal membranes  Eyes: Anicteric  Lungs:  Clear to auscultation  Heart: Regular rate and rhythm  Abdomen:  Soft, nontender  Extremities:  no peripheral edema.  Neurologic: Moving all four extremities. Not responsive to verbal stimuli  Skin: No lesions  GU Foley    Basic Metabolic Panel: Recent Labs  Lab 06/22/20 0542 06/23/20 0454 06/24/20 0504 06/25/20 0558 06/26/20 1040 06/27/20 0630  NA 142 146* 146*  --  143 144  K 3.7 3.5 3.8  --  4.2 4.1  CL 105 109 110  --  104 105  CO2 _0 --  28 28  GLUCOSE 170* 175* 251*  --  167* 195*  BUN 91* 126* 130*  --  124* 100*  CREATININE 3.70* 4.52* 4.94*  --  5.04* 5.41*  CALCIUM 8.7* 9.3 9.7  --  10.2 10.5*  MG 2.2 2.5* 2.5* 2.4 2.5* 2.8*  PHOS 9.3* 8.8* 8.0* 6.6* 7.9* 8.6*     Liver Function Tests: Recent Labs  Lab 06/22/20 0542 06/24/20 0504 06/26/20 1040  AST 35 46* 32  ALT 38 72* 68*  ALKPHOS 66 79 82  BILITOT 0.8 0.7 0.6  PROT 6.2* 6.3* 6.4*  ALBUMIN 2.6* 2.9* 2.8*    Recent Labs  Lab  06/20/20 1516  LIPASE 40  AMYLASE 61    No results for input(s): AMMONIA in the last 168 hours.  CBC: Recent Labs  Lab 06/23/20 0454 06/24/20 0504 06/25/20 0558 06/26/20 1040 06/27/20 0630  WBC 8.1 7.1 7.5 8.2 7.0  NEUTROABS 6.5 5.5 5.2 5.9 5.0  HGB 8.8* 8.9* 8.5* 8.8* 9.0*  HCT 27.2* 27.0* 25.2* 27.0* 27.1*  MCV 91.0 89.1 90.3 90.9 90.9  PLT 256 245 206 225 230     Cardiac Enzymes: No results for input(s): CKTOTAL, CKMB, CKMBINDEX, TROPONINI in the last 168 hours.  BNP: Invalid input(s): POCBNP  CBG: Recent Labs  Lab 06/26/20 1955 06/27/20 0032 06/27/20 0433 06/27/20 0818 06/27/20 1148  GLUCAP 147* 175* 194* 179* 169*     Microbiology: Results for orders placed or performed during the hospital encounter of 06/04/20  Blood culture (single)     Status: Abnormal   Collection Time: 06/04/20  5:18 PM   Specimen: BLOOD  Result Value Ref Range Status   Specimen Description   Final    BLOOD BLOOD RIGHT HAND Performed at [facility], [address], [place], [place] [address]    Special Requests   Final    BOTTLES DRAWN AEROBIC AND ANAEROBIC Blood Culture adequate volume Performed at [facility], [address] [place], [place] [address]    Culture  Setup Time   Final    GRAM POSITIVE COCCI IN BOTH AEROBIC AND ANAEROBIC BOTTLES CRITICAL RESULT CALLED TO, READ BACK BY AND VERIFIED WITH: Elvera Maria [phone] 06/06/2020 DLB Performed at [facility], [address] [place], [place] [address]    Culture (A)  Final    STAPHYLOCOCCUS EPIDERMIDIS THE SIGNIFICANCE OF ISOLATING THIS ORGANISM FROM A SINGLE SET OF BLOOD CULTURES WHEN MULTIPLE SETS ARE DRAWN IS UNCERTAIN. PLEASE NOTIFY THE MICROBIOLOGY DEPARTMENT WITHIN ONE WEEK IF SPECIATION AND SENSITIVITIES ARE REQUIRED. MICROCOCCUS SPECIES Standardized susceptibility testing for this organism is not available. KOCURIA RHIZOPHILIA Performed at [facility], [place] [address],  [place], [place] [address]    Report Status 06/10/2020 FINAL  Final  Resp Panel by RT-PCR (Flu A&B, Covid) Nasopharyngeal Swab     Status: None   Collection Time: 06/04/20  5:18 PM   Specimen: Nasopharyngeal Swab; Nasopharyngeal(NP) swabs in vial transport medium  Result Value Ref Range Status   SARS Coronavirus 2 by RT PCR NEGATIVE NEGATIVE Final    Comment: (NOTE) SARS-CoV-2 target nucleic acids are NOT DETECTED.  The SARS-CoV-2 RNA is generally detectable in upper respiratory specimens during the acute phase of infection. The lowest concentration of SARS-CoV-2 viral copies this assay can detect is 138 copies/mL. A negative result does not preclude SARS-Cov-2 infection and should not be used as the sole basis for treatment or other patient management decisions. A negative result may occur with  improper specimen collection/handling, submission of specimen other than nasopharyngeal swab, presence of viral mutation(s) within the areas targeted by this assay, and inadequate number of viral copies(<138 copies/mL). A negative result must be combined with clinical observations, patient history, and epidemiological information. The expected result is Negative.  Fact Sheet for Patients:  EntrepreneurPulse.com.au  Fact Sheet for Healthcare Providers:  IncredibleEmployment.be  This test is no t yet approved or cleared by the [place] FDA and  has been authorized for detection and/or diagnosis of SARS-CoV-2 by FDA under an Emergency Use Authorization (EUA). This EUA will remain  in effect (meaning this test can be used) for the duration of the COVID-19 declaration under Section 564(b)(1) of the Act, 21 U.S.C.section 360bbb-3(b)(1), unless the authorization is terminated  or revoked sooner.       Influenza A by PCR NEGATIVE NEGATIVE Final   Influenza B by PCR NEGATIVE NEGATIVE Final    Comment: (NOTE) The Xpert Xpress SARS-CoV-2/FLU/RSV plus assay is  intended as an aid in the diagnosis of influenza from Nasopharyngeal swab specimens and should not be used as a sole basis for treatment. Nasal washings and aspirates are unacceptable for Xpert Xpress SARS-CoV-2/FLU/RSV testing.  Fact Sheet for Patients: EntrepreneurPulse.com.au  Fact Sheet for Healthcare Providers: IncredibleEmployment.be  This test is not yet approved or cleared by the [place] FDA and has been authorized for detection and/or diagnosis of SARS-CoV-2 by FDA under an Emergency Use Authorization (EUA). This EUA will remain in effect (meaning this test can be used) for the duration of the COVID-19 declaration under Section 564(b)(1) of the Act, 21 U.S.C. section 360bbb-3(b)(1), unless the authorization is terminated or revoked.  Performed at [facility], [address] [place], [place] [address]   Blood Culture ID Panel (Reflexed)     Status: Abnormal   Collection Time: 06/04/20  5:18 PM  Result Value Ref Range Status   Enterococcus faecalis NOT DETECTED NOT DETECTED Final  Enterococcus Faecium NOT DETECTED NOT DETECTED Final   Listeria monocytogenes NOT DETECTED NOT DETECTED Final   Staphylococcus species DETECTED (A) NOT DETECTED Final    Comment: CRITICAL RESULT CALLED TO, READ BACK BY AND VERIFIED WITH: [facility] MITCHELL 0388 06/06/2020 DLB    Staphylococcus aureus (BCID) NOT DETECTED NOT DETECTED Final   Staphylococcus epidermidis DETECTED (A) NOT DETECTED Final    Comment: CRITICAL RESULT CALLED TO, READ BACK BY AND VERIFIED WITH: Elvera Maria [id number] 06/06/2020 DLB    Staphylococcus lugdunensis NOT DETECTED NOT DETECTED Final   Streptococcus species NOT DETECTED NOT DETECTED Final   Streptococcus agalactiae NOT DETECTED NOT DETECTED Final   Streptococcus pneumoniae NOT DETECTED NOT DETECTED Final   Streptococcus pyogenes NOT DETECTED NOT DETECTED Final   A.calcoaceticus-baumannii NOT DETECTED NOT DETECTED  Final   Bacteroides fragilis NOT DETECTED NOT DETECTED Final   Enterobacterales NOT DETECTED NOT DETECTED Final   Enterobacter cloacae complex NOT DETECTED NOT DETECTED Final   Escherichia coli NOT DETECTED NOT DETECTED Final   Klebsiella aerogenes NOT DETECTED NOT DETECTED Final   Klebsiella oxytoca NOT DETECTED NOT DETECTED Final   Klebsiella pneumoniae NOT DETECTED NOT DETECTED Final   Proteus species NOT DETECTED NOT DETECTED Final   Salmonella species NOT DETECTED NOT DETECTED Final   Serratia marcescens NOT DETECTED NOT DETECTED Final   Haemophilus influenzae NOT DETECTED NOT DETECTED Final   Neisseria meningitidis NOT DETECTED NOT DETECTED Final   Pseudomonas aeruginosa NOT DETECTED NOT DETECTED Final   Stenotrophomonas maltophilia NOT DETECTED NOT DETECTED Final   Candida albicans NOT DETECTED NOT DETECTED Final   Candida auris NOT DETECTED NOT DETECTED Final   Candida glabrata NOT DETECTED NOT DETECTED Final   Candida krusei NOT DETECTED NOT DETECTED Final   Candida parapsilosis NOT DETECTED NOT DETECTED Final   Candida tropicalis NOT DETECTED NOT DETECTED Final   Cryptococcus neoformans/gattii NOT DETECTED NOT DETECTED Final   Methicillin resistance mecA/C NOT DETECTED NOT DETECTED Final    Comment: Performed at [facility], [address] [place], [place] [address]  Blood culture (single)     Status: None   Collection Time: 06/04/20  9:48 PM   Specimen: BLOOD  Result Value Ref Range Status   Specimen Description BLOOD BLOOD RIGHT ARM  Final   Special Requests   Final    BOTTLES DRAWN AEROBIC AND ANAEROBIC Blood Culture adequate volume   Culture   Final    NO GROWTH 5 DAYS Performed at [facility], [address], [place], [place] [address]    Report Status 06/09/2020 FINAL  Final  Urine Culture     Status: None   Collection Time: 06/06/20  2:37 PM   Specimen: Urine, Random  Result Value Ref Range Status   Specimen Description   Final     URINE, RANDOM Performed at [facility], [address] [place], [place] [address]    Special Requests   Final    NONE Performed at [facility], [address] [place], [place] [address]    Culture   Final    NO GROWTH Performed at [facility], [address], [place], [place] [address]    Report Status 06/08/2020 FINAL  Final  MRSA PCR Screening     Status: None   Collection Time: 06/06/20  2:40 PM   Specimen: Nasopharyngeal  Result Value Ref Range Status   MRSA by PCR NEGATIVE NEGATIVE Final    Comment:  The GeneXpert MRSA Assay (FDA approved for NASAL specimens only), is one component of a comprehensive MRSA colonization surveillance program. It is not intended to diagnose MRSA infection nor to guide or monitor treatment for MRSA infections. Performed at [facility], [place], [place] [address]   Acid Fast Smear (AFB)     Status: None   Collection Time: 06/06/20  6:30 PM   Specimen: Bronchoalveolar Lavage; Sputum  Result Value Ref Range Status   AFB Specimen Processing Concentration  Final   Acid Fast Smear Negative  Final    Comment: (NOTE) Performed At: [facility] [place], [place] [id number] Rush Farmer MD [id number]    Source (AFB) BRONCHIAL ALVEOLAR LAVAGE  Corrected    Comment: Performed at [facility], [address] [place], [place] [address] CORRECTED ON 06/02 AT 2239: PREVIOUSLY REPORTED AS SPUTUM   Culture, Respiratory w Gram Stain     Status: None   Collection Time: 06/06/20  6:30 PM   Specimen: Bronchoalveolar Lavage  Result Value Ref Range Status   Specimen Description   Final    BRONCHIAL ALVEOLAR LAVAGE Performed at [facility], [address], [place], [place] [address]    Special Requests   Final    NONE Performed at [facility], [address] [place], [place] [address]    Gram Stain   Final    FEW WBC  PRESENT,BOTH PMN AND MONONUCLEAR NO ORGANISMS SEEN    Culture   Final    NO GROWTH 3 DAYS Performed at [facility], [place] [address], [place], [place] [address]    Report Status 06/09/2020 FINAL  Final  Fungus Culture With Stain     Status: None (Preliminary result)   Collection Time: 06/06/20  6:30 PM  Result Value Ref Range Status   Fungus Stain Final report  Final    Comment: (NOTE) Performed At: [facility] [place], [place] [id number] Rush Farmer MD [id number]    Fungus (Mycology) Culture PENDING  Incomplete   Fungal Source BRONCHIAL ALVEOLAR LAVAGE  Final    Comment: Performed at [facility], [place] [address], [place], [place] [address]  Fungus Culture Result     Status: None   Collection Time: 06/06/20  6:30 PM  Result Value Ref Range Status   Result 1 Comment  Final    Comment: (NOTE) KOH/Calcofluor preparation:  no fungus observed. Performed At: [facility] [place], [place] [id number] Rush Farmer MD [id number]   Respiratory (~20 pathogens) panel by PCR     Status: None   Collection Time: 06/07/20  5:50 AM   Specimen: Nasopharyngeal Swab; Respiratory  Result Value Ref Range Status   Adenovirus NOT DETECTED NOT DETECTED Final   Coronavirus 229E NOT DETECTED NOT DETECTED Final    Comment: (NOTE) The Coronavirus on the Respiratory Panel, DOES NOT test for the novel  Coronavirus (2019 nCoV)    Coronavirus HKU1 NOT DETECTED NOT DETECTED Final   Coronavirus NL63 NOT DETECTED NOT DETECTED Final   Coronavirus OC43 NOT DETECTED NOT DETECTED Final   Metapneumovirus NOT DETECTED NOT DETECTED Final   Rhinovirus / Enterovirus NOT DETECTED NOT DETECTED Final   Influenza A NOT DETECTED NOT DETECTED Final   Influenza B NOT DETECTED NOT DETECTED Final   Parainfluenza Virus 1 NOT DETECTED NOT DETECTED Final   Parainfluenza Virus 2 NOT DETECTED NOT DETECTED Final   Parainfluenza Virus 3 NOT DETECTED  NOT  DETECTED Final   Parainfluenza Virus 4 NOT DETECTED NOT DETECTED Final   Respiratory Syncytial Virus NOT DETECTED NOT DETECTED Final   Bordetella pertussis NOT DETECTED NOT DETECTED Final   Bordetella Parapertussis NOT DETECTED NOT DETECTED Final   Chlamydophila pneumoniae NOT DETECTED NOT DETECTED Final   Mycoplasma pneumoniae NOT DETECTED NOT DETECTED Final    Comment: Performed at [facility], [place] [address] [place], [place] [address]  Culture, Respiratory w Gram Stain     Status: None   Collection Time: 06/18/20  4:43 PM   Specimen: Tracheal Aspirate; Respiratory  Result Value Ref Range Status   Specimen Description   Final    TRACHEAL ASPIRATE Performed at [facility], [address] [place], [place] [address]    Special Requests   Final    NONE Performed at [facility], [address] [place], [place] [address]    Gram Stain   Final    MODERATE WBC PRESENT,BOTH PMN AND MONONUCLEAR FEW YEAST RARE GRAM NEGATIVE RODS Performed at [facility], [place] [address], [place], [place] [address]    Culture FEW CANDIDA ALBICANS  Final   Report Status 06/21/2020 FINAL  Final    Coagulation Studies: No results for input(s): LABPROT, INR in the last 72 hours.  Urinalysis: No results for input(s): COLORURINE, LABSPEC, PHURINE, GLUCOSEU, HGBUR, BILIRUBINUR, KETONESUR, PROTEINUR, UROBILINOGEN, NITRITE, LEUKOCYTESUR in the last 72 hours.  Invalid input(s): APPERANCEUR    Imaging: EEG adult  Result Date: 06/25/2020 Derek Jack, MD     06/25/2020  3:04 PM Routine EEG Report Delrae Antavion Garr Wyatt Ball. is a 60 y.o. male with a history of encephalopathy who is undergoing an EEG to evaluate for seizures. Report: This EEG was acquired with electrodes placed according to the International 10-20 electrode system (including Fp1, Fp2, F3, F4, C3, C4, P3, P4, O1, O2, T3, T4, T5, T6, A1, A2, Fz, Cz, Pz). The following electrodes were missing or displaced: none.  The occipital dominant rhythm was 7-8 Hz. This activity is reactive to stimulation. Drowsiness was manifested by background fragmentation; deeper stages of sleep were not identified. There is focal slowing over the bifrontotemporal regions. There were no interictal epileptiform discharges. There were no electrographic seizures identified. Photic stimulation and hyperventilation were not performed. Impression: This EEG was obtained while awake and drowsy and is abnormal due to mild diffuse slowing and superimposed focal slowing over the bifrontotemporal regions indicating both global and focal cerebral dysfunction. There were no epileptiform abnormalities seen in this recording. Su Monks, MD [facility] [phone] If 7pm- 7am, please page neurology on call as listed in Rew.     Medications:    sodium chloride Stopped (06/22/20 1614)   feeding supplement (NEPRO CARB STEADY) 55 mL/hr at 06/27/20 0400   fluconazole (DIFLUCAN) IV Stopped (06/26/20 2138)    amLODipine  5 mg Per Tube Daily   chlorhexidine gluconate (MEDLINE KIT)  15 mL Mouth Rinse BID   Chlorhexidine Gluconate Cloth  6 each Topical Daily   cloNIDine  0.3 mg Transdermal Weekly   feeding supplement (PROSource TF)  45 mL Per Tube Daily   free water  100 mL Per Tube Q4H   gabapentin  100 mg Per Tube Q8H   heparin injection (subcutaneous)  5,000 Units Subcutaneous Q8H   insulin aspart  0-20 Units Subcutaneous Q4H   insulin aspart  3 Units Subcutaneous Q4H   insulin glargine  20 Units Subcutaneous Daily   metoprolol tartrate  25 mg Per Tube BID   multivitamin  1 tablet Per Tube QHS   nutrition supplement (JUVEN)  1 packet Per Tube BID   pantoprazole (PROTONIX) IV  40 mg Intravenous Q24H   prazosin  4 mg Per Tube QHS   QUEtiapine  12.5 mg Per Tube BID   sodium chloride flush  10-40 mL Intracatheter Q12H   traZODone  150 mg Per Tube QHS   sodium chloride, haloperidol lactate, heparin sodium (porcine), heparin  sodium (porcine), hydrALAZINE, labetalol, ondansetron **OR** ondansetron (ZOFRAN) IV, oxyCODONE-acetaminophen, prazosin, sodium chloride flush  Assessment/ Plan:  Mr. Noland Hatler Wyatt Ball. is a 60 y.o. white male with hypertension, PTSD, diabetes mellitus type II, migraines, GERD, neurogenic bladder, history of transverse myelitis, who is admitted to [facility] on 06/04/2020 for Sepsis ([place]) [A41.9] Altered mental status, unspecified altered mental status type [R41.82] Community acquired pneumonia, unspecified laterality [J18.9]  First dialysis was 6/6.   Acute kidney injury: admitting creatinine of 1.06 with GFR >60. Underlying proteinuria, hematuria, glycosuria consistent with diabetic nephropathy. Patient was also taking ibuprofen regularly at home. Patient with the [place], baseline creatinine not available. Acute kidney injury secondary to sepsis, ATN, IV contrast. Serologic work up negative. First hemodialysis treatment on June 6. Not a candidate for long term dialysis.   - hemodialysis catheter removed.   Hypertension: elevated readings, 145/64. Current regimen of prazosin, amlodipine and clonidine patch. Home regimen of lisinopril and metoprolol, both are on hold.   Diabetes mellitus type II with renal manifestations: noninsulin dependent. holding home metformin. Hemoglobin A1c of 7.4%. Glucose stable  Altered mental status: appreciate neurology input. Prazosin and quetiapine.   Overall prognosis is poor. Not a candidate for further dialysis. Appreciate Palliative care input   LOS: [place] 6/23/202212:49 PM

## 2020-06-27 NOTE — Progress Notes (Addendum)
Progress Note    Wyatt Ball.  BTD:[id number] DOB: 1960-09-05  DOA: 06/04/2020 PCP: System, Provider Not In      Brief Narrative:    Medical records reviewed and are as summarized below:  Wyatt Chisum Blassingame Brooke Bonito. is a 60 y.o. male with medical history significant for hypertension, PTSD, diabetes, migraine, GERD, neurogenic bladder secondary to remote history of transverse myelitis requiring self urethral catheterization, frequent UTIs, chronic back pain on opioids, generally independent at baseline.  He usually gets his care at the [facility] hospital.  He was brought to the emergency room because of altered mental status, cough, shortness of breath, fever, vomiting and diarrhea.  He was admitted to the hospital for sepsis secondary to pneumonia.  He was transferred to the ICU for septic shock, AKI, COPD exacerbation.  He was intubated and placed on mechanical ventilation.  5/21 admitted for sepsis and pneumonia. 6/2 transferred to ICU, intubated underwent bronchoscopy 6/3 self extubated followed by reintubated 6/7 HD started 6/15 extubated 6/19 transferred to [facility]. Neurology, nephrology, urology and infectious disease consulted.   Assessment/Plan:   Principal Problem:   CAP (community acquired pneumonia) Active Problems:   Acute metabolic encephalopathy   Severe sepsis with septic shock (HCC)   Neurogenic bladder   Chronic, continuous use of opioids   Chronic low back pain   Type 2 diabetes mellitus without complication (HCC)   Migraines   Recurrent UTI   History of colon polyps   Urinary retention   Altered mental status   Pressure injury of skin   AKI (acute kidney injury) ([place])   Nutrition Problem: Inadequate oral intake Etiology: inability to eat  Signs/Symptoms: NPO status   Body mass index is 30.44 kg/m.  (Obesity)    Septic shock secondary to community-acquired pneumonia: He completed 8 days of azithromycin, 3 days of cephalosporin plus 5 days of  IV meropenem followed by 7 days of Levaquin and doxycycline.  S/p bronchoscopy blood cultures were negative.  No growth on blood cultures as well.  Acute hypoxic respiratory failure: Resolved.  S/p intubation and extubation.  He is tolerating room air.  AKI, hyperphosphatemia: Hemodialysis was initiated on 06/10/2020.  Dialysis catheter has been removed and there is no plan for further hemodialysis.  He is not a good candidate for long-term dialysis.  Case was discussed with nephrologist, Dr. Juleen China.  Acute metabolic encephalopathy, agitation, history of PTSD: Patient has been evaluated by neurologist.  EEG was unremarkable.  Continue psychotropics and prazosin  Dysphagia: Continue enteral nutrition via NG tube.  Hypertension: BP is uncontrolled.  Continue antihypertensives.  Stage II coccygeal decubitus ulcer: Present on admission: Continue foam dressing.  Hypernatremia and hyperkalemia: Improved  Other comorbidities include neurogenic bladder with history of transverse myelitis, chronic pain syndrome on opioids  Case was discussed with his brother, Wyatt Ball.  Diagnosis and prognoses were discussed.  He said that he wanted patient to be DNR.  However, he wanted to discuss this with patient's daughter, Wyatt Ball.  I called Wyatt Ball to discuss the plan of care but was no response.  I left a voice message for her to call back.   Diet Order             Diet NPO time specified  Diet effective now                      Consultants: Intensivist Urologist Infectious disease Urologist Nephrologist  Procedures: Intubation and bronchoscopy on on 06/06/2020 Central venous catheter  insertion on 06/10/2020    Medications:    amLODipine  5 mg Per Tube Daily   chlorhexidine gluconate (MEDLINE KIT)  15 mL Mouth Rinse BID   Chlorhexidine Gluconate Cloth  6 each Topical Daily   cloNIDine  0.3 mg Transdermal Weekly   feeding supplement (PROSource TF)  45 mL Per Tube Daily   free water  100 mL  Per Tube Q4H   gabapentin  100 mg Per Tube Q8H   heparin injection (subcutaneous)  5,000 Units Subcutaneous Q8H   insulin aspart  0-20 Units Subcutaneous Q4H   insulin aspart  3 Units Subcutaneous Q4H   insulin glargine  20 Units Subcutaneous Daily   metoprolol tartrate  25 mg Per Tube BID   multivitamin  1 tablet Per Tube QHS   nutrition supplement (JUVEN)  1 packet Per Tube BID   pantoprazole (PROTONIX) IV  40 mg Intravenous Q24H   prazosin  4 mg Per Tube QHS   QUEtiapine  12.5 mg Per Tube BID   sodium chloride flush  10-40 mL Intracatheter Q12H   traZODone  150 mg Per Tube QHS   Continuous Infusions:  sodium chloride Stopped (06/22/20 1614)   feeding supplement (NEPRO CARB STEADY) 1,000 mL (06/27/20 1301)   fluconazole (DIFLUCAN) IV Stopped (06/26/20 2138)     Anti-infectives (From admission, onward)    Start     Dose/Rate Route Frequency Ordered Stop   06/26/20 2100  fluconazole (DIFLUCAN) IVPB 50 mg       See Hyperspace for full Linked Orders Report.   50 mg 25 mL/hr over 60 Minutes Intravenous Every 24 hours 06/25/20 1835 07/01/20 2359   06/25/20 2000  fluconazole (DIFLUCAN) IVPB 200 mg       See Hyperspace for full Linked Orders Report.   200 mg 100 mL/hr over 60 Minutes Intravenous  Once 06/25/20 1835 06/25/20 2348   06/14/20 1800  levofloxacin (LEVAQUIN) IVPB 500 mg        500 mg 100 mL/hr over 60 Minutes Intravenous Every 48 hours 06/12/20 1624 06/19/20 0210   06/12/20 1800  levofloxacin (LEVAQUIN) IVPB 750 mg        750 mg 100 mL/hr over 90 Minutes Intravenous  Once 06/11/20 2239 06/12/20 1904   06/11/20 1800  meropenem (MERREM) 500 mg in sodium chloride 0.9 % 100 mL IVPB  Status:  Discontinued        500 mg 200 mL/hr over 30 Minutes Intravenous Every 24 hours 06/10/20 1444 06/11/20 2231   06/10/20 1300  doxycycline (VIBRAMYCIN) 100 mg in sodium chloride 0.9 % 250 mL IVPB  Status:  Discontinued        100 mg 125 mL/hr over 120 Minutes Intravenous Every 12 hours  06/10/20 1149 06/17/20 1704   06/09/20 1015  meropenem (MERREM) 500 mg in sodium chloride 0.9 % 100 mL IVPB        500 mg 200 mL/hr over 30 Minutes Intravenous Every 12 hours 06/09/20 0922 06/11/20 1203   06/08/20 2200  meropenem (MERREM) 1 g in sodium chloride 0.9 % 100 mL IVPB  Status:  Discontinued        1 g 200 mL/hr over 30 Minutes Intravenous Every 12 hours 06/08/20 0845 06/09/20 0922   06/07/20 2200  azithromycin (ZITHROMAX) 500 mg in sodium chloride 0.9 % 250 mL IVPB        500 mg 250 mL/hr over 60 Minutes Intravenous Every 24 hours 06/07/20 1441 06/11/20 2211   06/07/20 1600  ceFEPIme (MAXIPIME)  2 g in sodium chloride 0.9 % 100 mL IVPB  Status:  Discontinued        2 g 200 mL/hr over 30 Minutes Intravenous Every 8 hours 06/07/20 1441 06/07/20 1442   06/07/20 1600  meropenem (MERREM) 1 g in sodium chloride 0.9 % 100 mL IVPB  Status:  Discontinued        1 g 200 mL/hr over 30 Minutes Intravenous Every 8 hours 06/07/20 1442 06/08/20 0845   06/06/20 1630  vancomycin (VANCOREADY) IVPB 1500 mg/300 mL  Status:  Discontinued        1,500 mg 150 mL/hr over 120 Minutes Intravenous  Once 06/06/20 1533 06/06/20 1632   06/05/20 0600  cefTRIAXone (ROCEPHIN) 2 g in sodium chloride 0.9 % 100 mL IVPB  Status:  Discontinued        2 g 200 mL/hr over 30 Minutes Intravenous Every 24 hours 06/04/20 2029 06/07/20 1439   06/04/20 2200  azithromycin (ZITHROMAX) 500 mg in sodium chloride 0.9 % 250 mL IVPB        500 mg 250 mL/hr over 60 Minutes Intravenous Every 24 hours 06/04/20 2029 06/07/20 0003   06/04/20 1915  ceFEPIme (MAXIPIME) 2 g in sodium chloride 0.9 % 100 mL IVPB        2 g 200 mL/hr over 30 Minutes Intravenous  Once 06/04/20 1906 06/04/20 2055   06/04/20 1915  metroNIDAZOLE (FLAGYL) IVPB 500 mg        500 mg 100 mL/hr over 60 Minutes Intravenous  Once 06/04/20 1906 06/04/20 2055   06/04/20 1915  vancomycin (VANCOCIN) IVPB 1000 mg/200 mL premix  Status:  Discontinued        1,000 mg 200  mL/hr over 60 Minutes Intravenous  Once 06/04/20 1906 06/04/20 1912   06/04/20 1915  vancomycin (VANCOREADY) IVPB 1750 mg/350 mL        1,750 mg 175 mL/hr over 120 Minutes Intravenous  Once 06/04/20 1912 06/04/20 2257              Family Communication/Anticipated D/C date and plan/Code Status   DVT prophylaxis: heparin injection 5,000 Units Start: 06/10/20 2200     Code Status: Full Code  Family Communication: None Disposition Plan:    Status is: Inpatient  Remains inpatient appropriate because:Inpatient level of care appropriate due to severity of illness  Dispo: The patient is from: Home              Anticipated d/c is to: SNF              Patient currently is not medically stable to d/c.   Difficult to place patient No           Subjective:   Interval events noted.  He is unable to provide any history because of confusion.  Objective:    Vitals:   06/27/20 0441 06/27/20 0525 06/27/20 0815 06/27/20 1144  BP:  (!) 157/77 (!) 170/71 (!) 145/64  Pulse:  (!) 106 (!) 105 96  Resp:  _0 Temp:  98.2 F (36.8 C) 98.9 F (37.2 C) 99.4 F (37.4 C)  TempSrc:  Oral Oral Oral  SpO2:  95% 95% 96%  Weight: 85.5 kg     Height:       No data found.   Intake/Output Summary (Last 24 hours) at 06/27/2020 1355 Last data filed at 06/27/2020 1141 Gross per 24 hour  Intake 210 ml  Output 2040 ml  Net -1830 ml   Wyatt Ball  Weights   06/21/20 0530 06/26/20 0500 06/27/20 0441  Weight: 85.2 kg 86.1 kg 85.5 kg    Exam:  GEN: NAD SKIN: Warm and dry EYES: EOMI ENT: MMM, NG tube in place CV: RRR PULM: CTA B ABD: soft, ND, NT, +BS CNS: Alert but confused, speech is slurred and incomprehensible.  He moves all extremities spontaneously. EXT: No edema or tenderness GU: Foley catheter draining amber urine      Data Reviewed:   I have personally reviewed following labs and imaging studies:  Labs: Labs show the following:   Basic Metabolic  Panel: Recent Labs  Lab 06/22/20 0542 06/23/20 0454 06/24/20 0504 06/25/20 0558 06/26/20 1040 06/27/20 0630  NA 142 146* 146*  --  143 144  K 3.7 3.5 3.8  --  4.2 4.1  CL 105 109 110  --  104 105  CO2 _0 --  28 28  GLUCOSE 170* 175* 251*  --  167* 195*  BUN 91* 126* 130*  --  124* 100*  CREATININE 3.70* 4.52* 4.94*  --  5.04* 5.41*  CALCIUM 8.7* 9.3 9.7  --  10.2 10.5*  MG 2.2 2.5* 2.5* 2.4 2.5* 2.8*  PHOS 9.3* 8.8* 8.0* 6.6* 7.9* 8.6*   GFR Estimated Creatinine Clearance: 14.7 mL/min (A) (by C-G formula based on SCr of 5.41 mg/dL (H)). Liver Function Tests: Recent Labs  Lab 06/22/20 0542 06/24/20 0504 06/26/20 1040  AST 35 46* 32  ALT 38 72* 68*  ALKPHOS 66 79 82  BILITOT 0.8 0.7 0.6  PROT 6.2* 6.3* 6.4*  ALBUMIN 2.6* 2.9* 2.8*   Recent Labs  Lab 06/20/20 1516  LIPASE 40  AMYLASE 61   No results for input(s): AMMONIA in the last 168 hours. Coagulation profile No results for input(s): INR, PROTIME in the last 168 hours.  CBC: Recent Labs  Lab 06/23/20 0454 06/24/20 0504 06/25/20 0558 06/26/20 1040 06/27/20 0630  WBC 8.1 7.1 7.5 8.2 7.0  NEUTROABS 6.5 5.5 5.2 5.9 5.0  HGB 8.8* 8.9* 8.5* 8.8* 9.0*  HCT 27.2* 27.0* 25.2* 27.0* 27.1*  MCV 91.0 89.1 90.3 90.9 90.9  PLT 256 245 206 225 230   Cardiac Enzymes: No results for input(s): CKTOTAL, CKMB, CKMBINDEX, TROPONINI in the last 168 hours. BNP (last 3 results) No results for input(s): PROBNP in the last 8760 hours. CBG: Recent Labs  Lab 06/26/20 1955 06/27/20 0032 06/27/20 0433 06/27/20 0818 06/27/20 1148  GLUCAP 147* 175* 194* 179* 169*   D-Dimer: No results for input(s): DDIMER in the last 72 hours. Hgb A1c: No results for input(s): HGBA1C in the last 72 hours. Lipid Profile: No results for input(s): CHOL, HDL, LDLCALC, TRIG, CHOLHDL, LDLDIRECT in the last 72 hours. Thyroid function studies: No results for input(s): TSH, T4TOTAL, T3FREE, THYROIDAB in the last 72 hours.  Invalid  input(s): FREET3 Anemia work up: No results for input(s): VITAMINB12, FOLATE, FERRITIN, TIBC, IRON, RETICCTPCT in the last 72 hours. Sepsis Labs: Recent Labs  Lab 06/24/20 0504 06/25/20 0558 06/26/20 1040 06/27/20 0630  WBC 7.1 7.5 8.2 7.0    Microbiology Recent Results (from the past 240 hour(s))  Culture, Respiratory w Gram Stain     Status: None   Collection Time: 06/18/20  4:43 PM   Specimen: Tracheal Aspirate; Respiratory  Result Value Ref Range Status   Specimen Description   Final    TRACHEAL ASPIRATE Performed at [facility], [address], [place], [place] [address]    Special Requests  Final    NONE Performed at [facility], [place], [place] [address]    Gram Stain   Final    MODERATE WBC PRESENT,BOTH PMN AND MONONUCLEAR FEW YEAST RARE GRAM NEGATIVE RODS Performed at [facility], [place] [address], [place], [place] [address]    Culture FEW CANDIDA ALBICANS  Final   Report Status 06/21/2020 FINAL  Final    Procedures and diagnostic studies:  EEG adult  Result Date: 06/25/2020 Wyatt Jack, MD     06/25/2020  3:04 PM Routine EEG Report Wyatt Tyren Hutcherson Brooke Bonito. is a 60 y.o. male with a history of encephalopathy who is undergoing an EEG to evaluate for seizures. Report: This EEG was acquired with electrodes placed according to the International 10-20 electrode system (including Fp1, Fp2, F3, F4, C3, C4, P3, P4, O1, O2, T3, T4, T5, T6, A1, A2, Fz, Cz, Pz). The following electrodes were missing or displaced: none. The occipital dominant rhythm was 7-8 Hz. This activity is reactive to stimulation. Drowsiness was manifested by background fragmentation; deeper stages of sleep were not identified. There is focal slowing over the bifrontotemporal regions. There were no interictal epileptiform discharges. There were no electrographic seizures identified. Photic stimulation and hyperventilation were not performed. Impression: This  EEG was obtained while awake and drowsy and is abnormal due to mild diffuse slowing and superimposed focal slowing over the bifrontotemporal regions indicating both global and focal cerebral dysfunction. There were no epileptiform abnormalities seen in this recording. Wyatt Monks, MD [facility] [phone] If 7pm- 7am, please page neurology on call as listed in AMION.               LOS: 23 days   Wyatt Ball  [facility]   Pager on [email or web address]. If 7PM-7AM, please contact night-coverage at [email or web address]     06/27/2020, 1:55 PM

## 2020-06-27 NOTE — Progress Notes (Signed)
Physical Therapy Treatment Patient Details Name: Wyatt Ball. MRN: [id number] DOB: 1960/08/24 Today's Date: 06/27/2020    History of Present Illness Wyatt Ball. is a 60 y.o. 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  Since hospitalization pt has been intubated, finally extubated 6/15.    PT Comments    Pt lethargic t/o the session, he opened his eyes with PT calling his name but was unable to show any meaningful effort with supine exercises.  PROM only.  We initiated 3day PT trial on recent eval and have not seen meaningful participation in that time.  Will complete PT orders at this time, should the situation change and he is able to participate will need new PT orders.  Signing off.   Follow Up Recommendations  SNF     Equipment Recommendations       Recommendations for Other Services       Precautions / Restrictions Precautions Precautions: Fall Restrictions Weight Bearing Restrictions: No    Mobility  Bed Mobility               General bed mobility comments: not appropriate or safe to attempt today    Transfers                    Ambulation/Gait                 Stairs             Wheelchair Mobility    Modified Rankin (Stroke Patients Only)       Balance                                            Cognition Arousal/Alertness: Lethargic   Overall Cognitive Status: Difficult to assess                                 General Comments: Pt unable to engage meaningfully with therapist during session. Does not follow VCs      Exercises General  Exercises - Lower Extremity Ankle Circles/Pumps: PROM;10 reps Heel Slides: PROM;10 reps Hip ABduction/ADduction: PROM;10 reps    General Comments        Pertinent Vitals/Pain Faces Pain Scale: No hurt    Home Living                      Prior Function            PT Goals (current goals can now be found in the care plan section) Progress towards PT goals: Not progressing toward goals - comment (pt has been PROM only for numerous PT visits with no meaningful interaction)    Frequency    Min 2X/week      PT Plan Other (comment) (pt has not been able to meaningful participate during 3 day trial, will complete PT orders)    Co-evaluation              AM-PAC PT "6 Clicks" Mobility  Outcome Measure  Help needed turning from your back to your side while in a flat bed without using bedrails?: Total Help needed moving from lying on your back to sitting on the side of a flat bed without using bedrails?: Total Help needed moving to and from a bed to a chair (including a wheelchair)?: Total Help needed standing up from a chair using your arms (e.g., wheelchair or bedside chair)?: Total Help needed to walk in hospital room?: Total Help needed climbing 3-5 steps with a railing? : Total 6 Click Score: 6    End of Session   Activity Tolerance: Patient limited by lethargy Patient left: with bed alarm set;with call bell/phone within reach   PT Visit Diagnosis: Unsteadiness on feet (R26.81);Muscle weakness (generalized) (M62.81);Difficulty in walking, not elsewhere classified (R26.2);Adult, failure to thrive (R62.7)     Time: 1540-1550 PT Time Calculation (min) (ACUTE ONLY): 10 min  Charges:  $Therapeutic Exercise: 8-22 mins                     Malachi Pro, DPT 06/27/2020, 4:38 PM

## 2020-06-27 NOTE — Progress Notes (Signed)
ADDENDUM on 06/28/2019 at 4:50 PM  I spoke to Nicole Cella, daughter of Mr. Rhines.  CODE STATUS was discussed.  She said that she wants her further to be DNR.  Enteral nutrition with PEG tube was discussed as well.  She understands that NG tube for nutrition is only temporary and has to be removed at some point.  However, she said that they are still thinking about PEG tube and they have not made a decision yet.

## 2020-06-27 NOTE — Plan of Care (Signed)
Neurology Plan of Care  Case d/w Dr. Myriam Forehand today who is waiting for a call back from daughter Nicole Cella regarding DNR status. Neurology does not recommend any additional workup at this time (please see extensive update from Dr. Iver Nestle 6/19; EEG the following day did not show any epileptiform abnl). We will not continue to actively follow however will remain available for any assistance we may provide regarding prognostication and GOC guidance. Please re-engage if that would be useful at any point.   Bing Neighbors, MD [facility] [phone]  If 7pm- 7am, please page neurology on call as listed in AMION.

## 2020-06-27 NOTE — Progress Notes (Signed)
Speech Language Pathology Treatment: Dysphagia  Patient Details Name: Wyatt Ball. MRN: [id number] DOB: 04-03-1960 Today's Date: 06/27/2020 Time: 2774-1287 SLP Time Calculation (min) (ACUTE ONLY): 30 min  Assessment / Plan / Recommendation Clinical Impression  Pt seen for ongoing assessment of status and readiness for trials of po's; safety w/ oral diet. Pt continues to present w/ Encephalopathy and confusion requiring MAX verbal/tactile/visual cues during tasks -- significant inconsistency in his responses despite cues and encouragement. Pt continues to present w/ severe encephalopathy c/b decreased attention to task, decreased oral awareness and engagement w/ oral prep tasks, inability to engage w/ functional tasks and problem-solve to take food/drink from spoon or drink from a straw, and inability to follow basic simple directions. When offered trials of ice chips, Nectar liquids and puree,  via swab, pt demonstrated significant Cognitive disengagement often letting the boluses lay anteriorly in mouth, further open mouth allowing anterior leakage, or did not open mouth to accept po's. If any bolus residue remained on his lips from po trials attempted, no lingual-oral prep movements or licking noted. No pharyngeal swallowing was appreciated during oral stim from po trials attempted.    Pt often turned head to the side w/ increased movements at times. He did not follow any instructions; movements and responses were non-purposeful suggestive of a [place] Level 2: ("Generalized response - Inconsistent, non-purposeful, nonspecific response to all stimuli. Responses may be delayed").  Pt was given Max verbal/tactile/visual cues and encouragement during po tasks. Pt did not give verbalizations during session but noted NSG notes that pt has yelled out. Pt did not follow basic commands, nor engage w/ this SLP today.   Given pt's currently declined mentation and high risk for aspiration/choking w/  any oral intake, recommend pt continue NPO status w/ frequent oral care for hygiene and stimulation of swallowing. ST to follow for PO readiness; trials at bedside ongoing. Recommend Palliative Care consult for overall GOC; Dietician following for enteral support via NGT - discussion of PEG support d/t pt's sustained, significantly declined, Cognitive status currently. MD and NSG updated on above.       HPI HPI: Wyatt Ball. is a 60 y.o. 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 CT chest abdomen and pelvis without contrast consistent with pneumonia. Multiple nodules left upper lobe likely infectious or inflammatory with follow-up imaging recommended to document resolution. Cardiomegaly with trace pericardial effusion.  Pt had a decline in status and was transferred to CCU on 6//2022 and orally intubated. He extubated/reintubated during this time until finally extubated on 06/19/2020. He is on [place] O2 support now, and though verbal yesterday somewhat, he is non-communicative with encephalopathy currently. MD unsure if psychiatric in nature and has consulted Neurology.      SLP Plan  Continue with current plan of care       Recommendations  Diet recommendations: NPO Medication Administration: Via alternative means                General recommendations:  (Palliative Care for GOC) Oral Care Recommendations: Oral care QID;Staff/trained caregiver to provide oral care Follow up Recommendations:  (TBD) SLP Visit Diagnosis: Dysphagia, oropharyngeal phase (R13.12) (significant Cognitive  decline/impact) Plan: Continue with current plan  of care       GO                  Jerilynn Som, MS, CCC-SLP Speech Language Pathologist Re[facility] Services [phone] [facility] 06/27/2020, 6:09 PM

## 2020-06-28 ENCOUNTER — Inpatient Hospital Stay: Payer: No Typology Code available for payment source

## 2020-06-28 DIAGNOSIS — Z515 Encounter for palliative care: Secondary | ICD-10-CM | POA: Diagnosis not present

## 2020-06-28 DIAGNOSIS — J189 Pneumonia, unspecified organism: Secondary | ICD-10-CM | POA: Diagnosis not present

## 2020-06-28 DIAGNOSIS — R6521 Severe sepsis with septic shock: Secondary | ICD-10-CM | POA: Diagnosis not present

## 2020-06-28 DIAGNOSIS — Z7189 Other specified counseling: Secondary | ICD-10-CM | POA: Diagnosis not present

## 2020-06-28 DIAGNOSIS — A419 Sepsis, unspecified organism: Secondary | ICD-10-CM | POA: Diagnosis not present

## 2020-06-28 DIAGNOSIS — N179 Acute kidney failure, unspecified: Secondary | ICD-10-CM | POA: Diagnosis not present

## 2020-06-28 DIAGNOSIS — G9341 Metabolic encephalopathy: Secondary | ICD-10-CM | POA: Diagnosis not present

## 2020-06-28 LAB — GLUCOSE, CAPILLARY
Glucose-Capillary: 191 mg/dL — ABNORMAL HIGH (ref 70–99)
Glucose-Capillary: 173 mg/dL — ABNORMAL HIGH (ref 70–99)
Glucose-Capillary: 171 mg/dL — ABNORMAL HIGH (ref 70–99)
Glucose-Capillary: 210 mg/dL — ABNORMAL HIGH (ref 70–99)
Glucose-Capillary: 102 mg/dL — ABNORMAL HIGH (ref 70–99)

## 2020-06-28 LAB — CBC WITH DIFFERENTIAL/PLATELET
Abs Immature Granulocytes: 0.03 10*3/uL (ref 0.00–0.07)
Basophils Absolute: 0 10*3/uL (ref 0.0–0.1)
Basophils Relative: 0 %
Eosinophils Absolute: 0.2 10*3/uL (ref 0.0–0.5)
Eosinophils Relative: 3 %
HCT: 27.2 % — ABNORMAL LOW (ref 39.0–52.0)
Hemoglobin: 8.9 g/dL — ABNORMAL LOW (ref 13.0–17.0)
Immature Granulocytes: 1 %
Lymphocytes Relative: 11 %
Lymphs Abs: 0.7 10*3/uL (ref 0.7–4.0)
MCH: 29.8 pg (ref 26.0–34.0)
MCHC: 32.7 g/dL (ref 30.0–36.0)
MCV: 91 fL (ref 80.0–100.0)
Monocytes Absolute: 0.5 10*3/uL (ref 0.1–1.0)
Monocytes Relative: 8 %
Neutro Abs: 4.8 10*3/uL (ref 1.7–7.7)
Neutrophils Relative %: 77 %
Platelets: 236 10*3/uL (ref 150–400)
RBC: 2.99 MIL/uL — ABNORMAL LOW (ref 4.22–5.81)
RDW: 15.2 % (ref 11.5–15.5)
WBC: 6.3 10*3/uL (ref 4.0–10.5)
nRBC: 0 % (ref 0.0–0.2)

## 2020-06-28 LAB — URINALYSIS, ROUTINE W REFLEX MICROSCOPIC
Bilirubin Urine: NEGATIVE
Glucose, UA: 50 mg/dL — AB
Ketones, ur: NEGATIVE mg/dL
Nitrite: NEGATIVE
Protein, ur: 30 mg/dL — AB
Specific Gravity, Urine: 1.014 (ref 1.005–1.030)
WBC, UA: >50 WBC/hpf — ABNORMAL HIGH (ref 0–5)
pH: 5 (ref 5.0–8.0)

## 2020-06-28 LAB — PHOSPHORUS: Phosphorus: 8.5 mg/dL — ABNORMAL HIGH (ref 2.5–4.6)

## 2020-06-28 LAB — LACTIC ACID, PLASMA
Lactic Acid, Venous: 1.6 mmol/L (ref 0.5–1.9)
Lactic Acid, Venous: 0.4 mmol/L — ABNORMAL LOW (ref 0.5–1.9)

## 2020-06-28 LAB — MAGNESIUM: Magnesium: 2.9 mg/dL — ABNORMAL HIGH (ref 1.7–2.4)

## 2020-06-28 LAB — PROCALCITONIN: Procalcitonin: 0.34 ng/mL

## 2020-06-28 IMAGING — DX DG CHEST 1V
1 series · 1 of 1 positions shown · non-contrast
Comparison: [DATE].
COMPARISON: [DATE].

Addendum:
CLINICAL DATA: Nasogastric tube placement.

EXAM:
CHEST  1 VIEW

[chest ap]
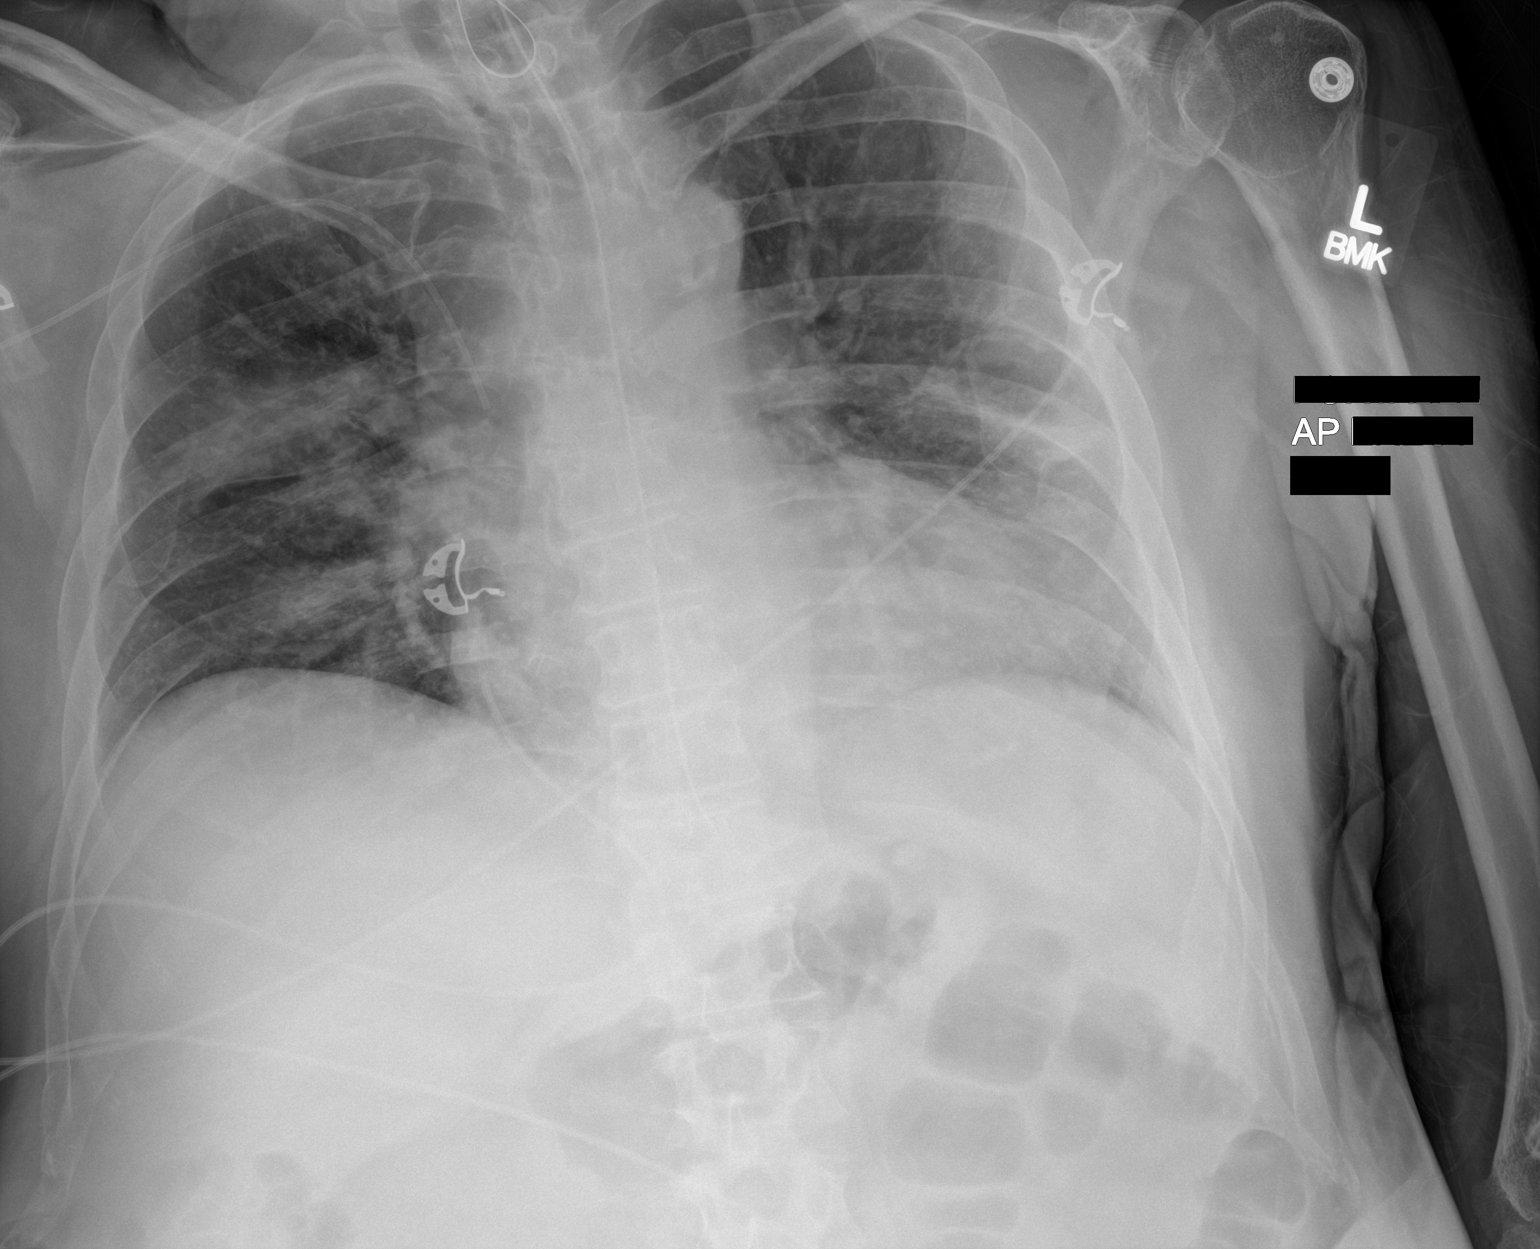

[1 of 1 positions shown; findings below may reference images not displayed]

FINDINGS: RIGHT-sided PICC line in stable position.

Nasogastric tube in similar position tip at the EG junction.

A redundant loop of the nasogastric tube is likely in the
hypopharynx.

Cardiomediastinal contours and hilar structures are stable.

Mild increased interstitial markings with diffuse patchy opacities
and potential cystic change in the LEFT chest unchanged.

On limited assessment no acute skeletal process.
IMPRESSION: 1. Nasogastric tube tip in the distal esophagus, tube likely coiled
in the hypopharynx.
2. Stable bilateral interstitial and airspace disease.

A call is out to the referring provider to further discuss findings
in the above case.

ADDENDUM:
The proximal portion of the NG tube is likely coiled in the
hypopharynx or proximal esophagus. Tip remains at the GE junction.

These results were called by telephone at the time of interpretation
on [DATE] at [DATE] to provider Dr. TUMBA, Who verbally
acknowledged these results.

*** End of Addendum ***
FINDINGS: RIGHT-sided PICC line in stable position.

Nasogastric tube in similar position tip at the EG junction.

A redundant loop of the nasogastric tube is likely in the
hypopharynx.

Cardiomediastinal contours and hilar structures are stable.

Mild increased interstitial markings with diffuse patchy opacities
and potential cystic change in the LEFT chest unchanged.

On limited assessment no acute skeletal process.
IMPRESSION: 1. Nasogastric tube tip in the distal esophagus, tube likely coiled
in the hypopharynx.
2. Stable bilateral interstitial and airspace disease.

A call is out to the referring provider to further discuss findings
in the above case.

## 2020-06-28 IMAGING — DX DG CHEST 1V
1 series · 2 of 2 positions shown · non-contrast
Comparison: [DATE]
COMPARISON: [DATE]

Addendum:
CLINICAL DATA: NG tube placement

EXAM:
CHEST  1 VIEW

[Series 1: chest ap · 0.14mm/px · 2 of 2 slices shown]
[im 1/2]
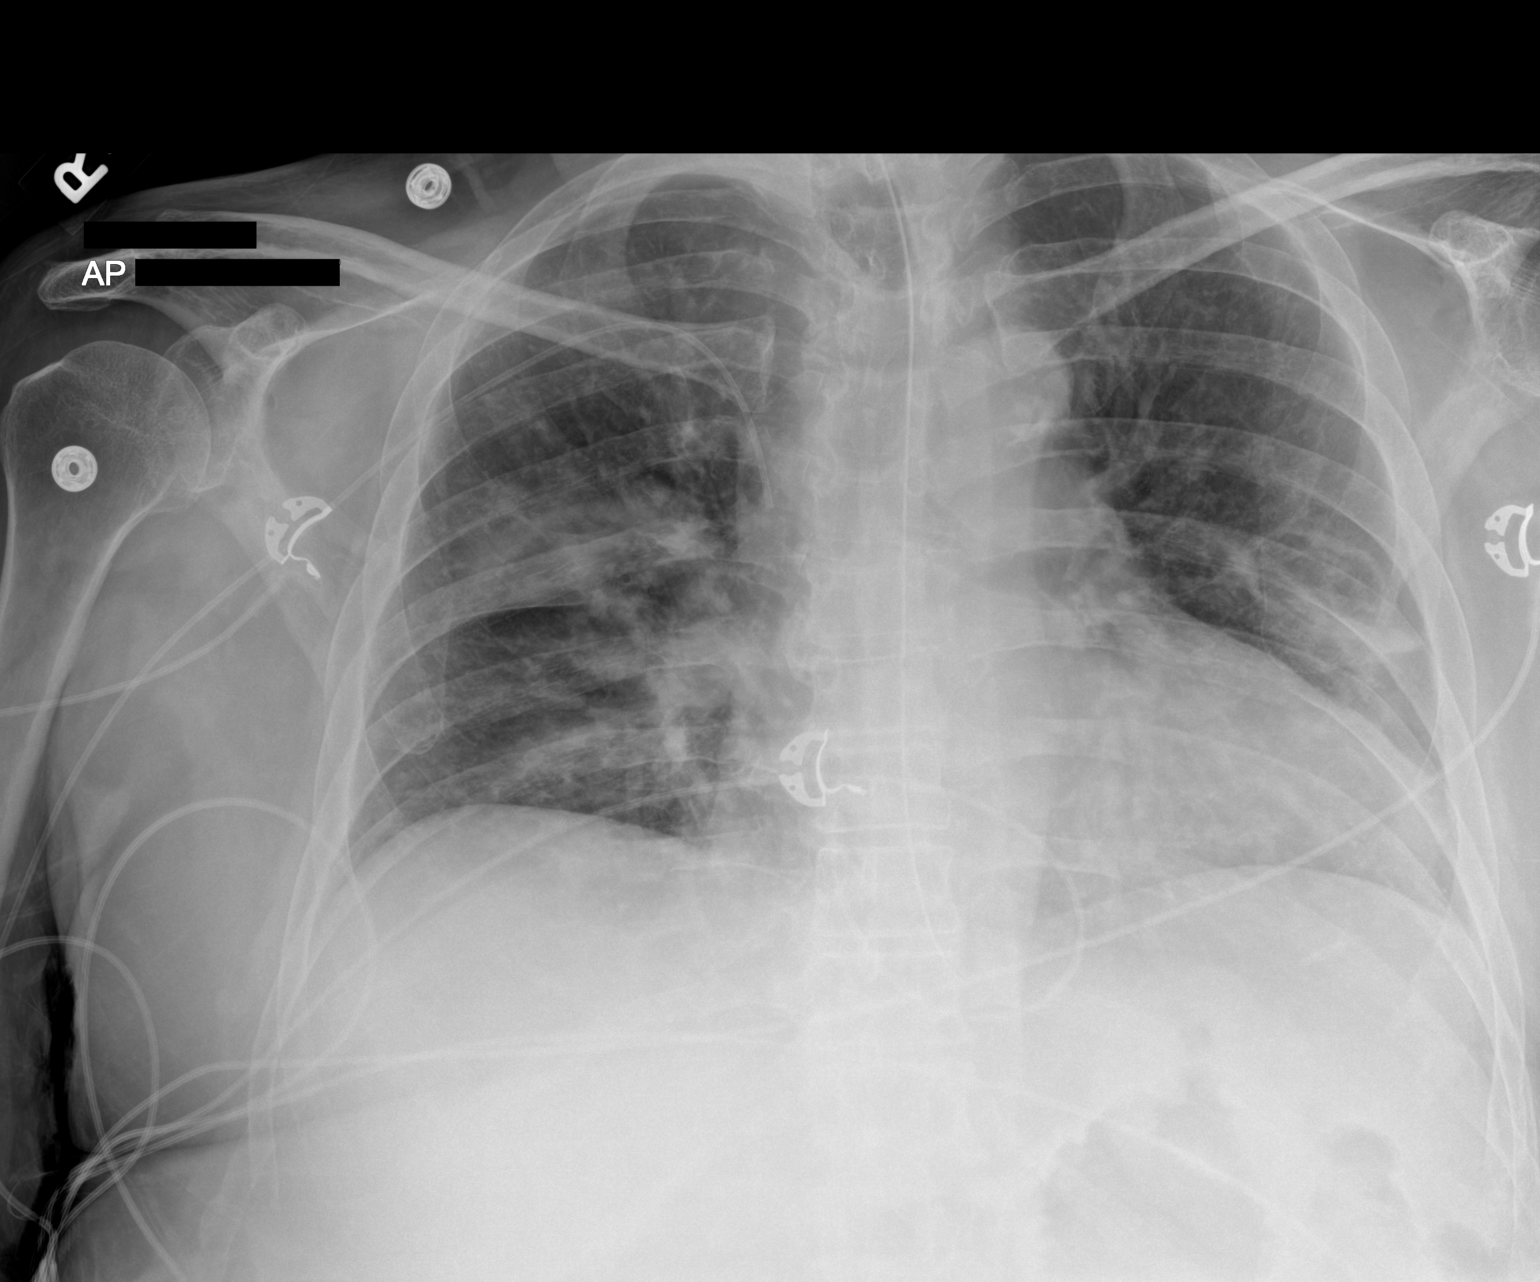
[im 2/2]
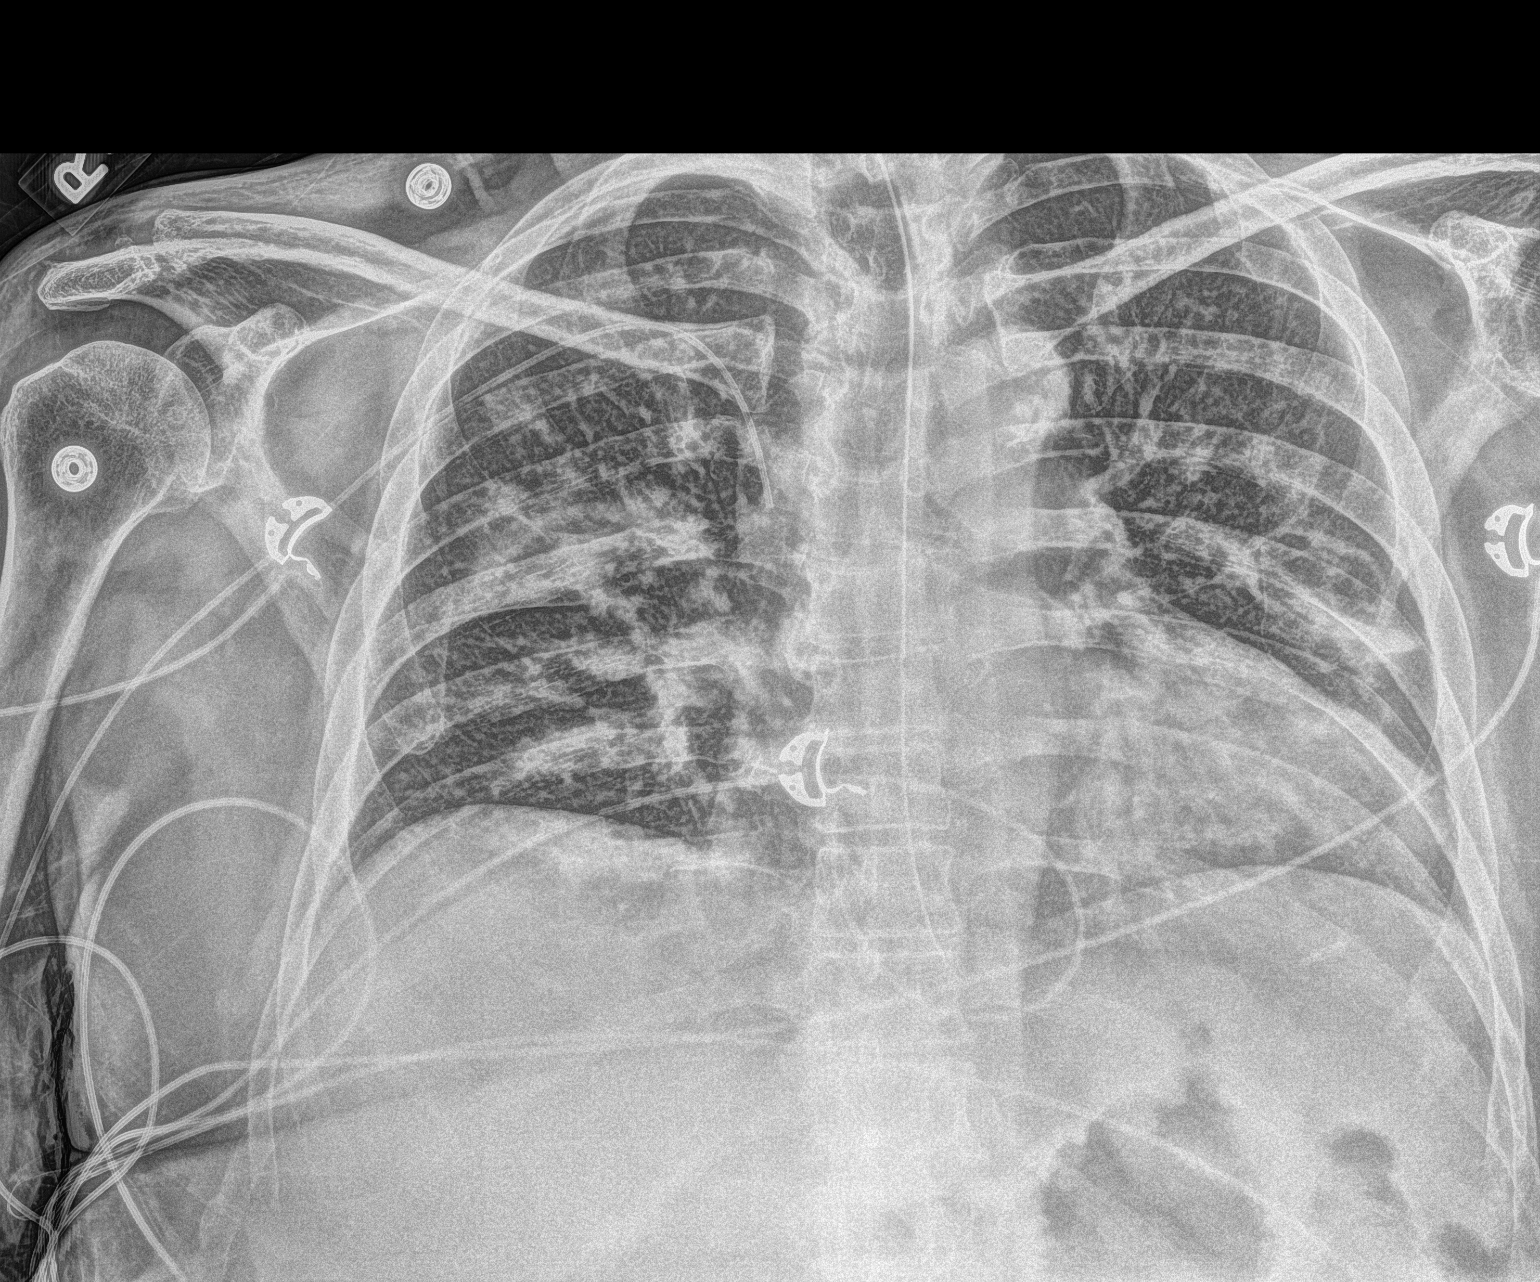

[2 of 2 positions shown; findings below may reference images not displayed]

FINDINGS: Right upper extremity central venous catheter tip over the SVC.
Esophageal tube tip overlies the distal esophagus/GE junction
region. Cardiomegaly. Similar patchy bilateral airspace opacities.
IMPRESSION: 1. Esophageal tube tip overlies the distal esophagus/GE junction
region, consider further advancement for more optimal positioning.
2. Similar cardiomegaly and patchy bilateral airspace disease

Critical Value/emergent results were called by telephone at the time
of interpretation on [DATE] at [DATE] to provider MASYTE
, who verbally acknowledged these results.

ADDENDUM:
Correction to emergent results call time. Correct time should be
[DATE] at 7 o'clock p.m.

*** End of Addendum ***
FINDINGS: Right upper extremity central venous catheter tip over the SVC.
Esophageal tube tip overlies the distal esophagus/GE junction
region. Cardiomegaly. Similar patchy bilateral airspace opacities.
IMPRESSION: 1. Esophageal tube tip overlies the distal esophagus/GE junction
region, consider further advancement for more optimal positioning.
2. Similar cardiomegaly and patchy bilateral airspace disease

Critical Value/emergent results were called by telephone at the time
of interpretation on [DATE] at [DATE] to provider MASYTE
, who verbally acknowledged these results.

## 2020-06-28 IMAGING — DX DG CHEST 1V PORT
1 series · 1 of 1 positions shown · non-contrast
Comparison: Chest x-ray from [DATE].
COMPARISON: Chest x-ray from [DATE].
COMPARISON: Chest x-ray from [DATE].

Addendum:
CLINICAL DATA: Unconscious, fever.

EXAM:
PORTABLE CHEST 1 VIEW

[chest ap]
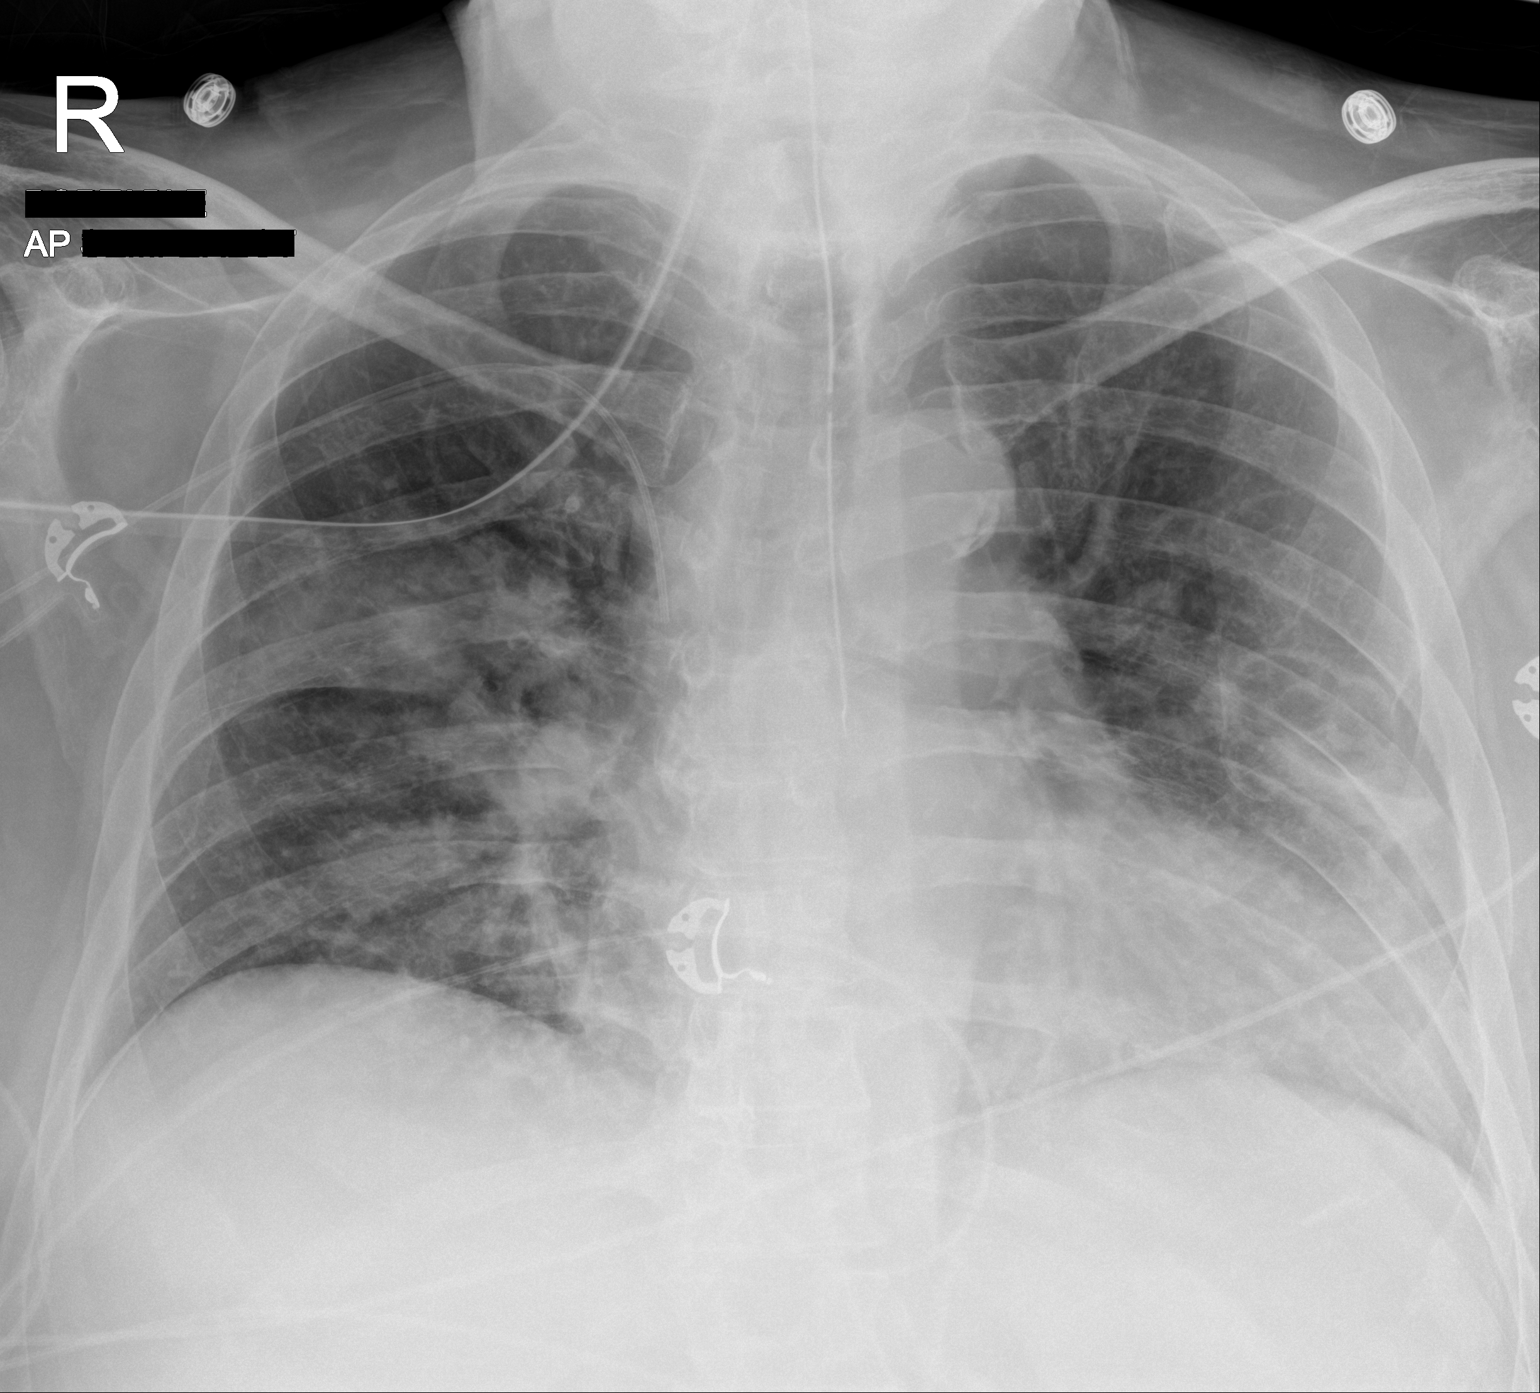

[1 of 1 positions shown; findings below may reference images not displayed]

FINDINGS: Heart size and mediastinal contours are stable compared to prior
imaging.

Diffuse patchy mid chest opacities bilaterally with no sign of lobar
consolidation or visible pleural effusion.

A gastric tube projects over the mediastinum. The tip is just below
the level of the carina but projects over the LEFT mainstem
bronchus, position uncertain.

RIGHT-sided PICC line tip at the level of the mid superior vena
cava.

EKG leads project over the chest.

On limited assessment no acute skeletal process.
IMPRESSION: 1. Diffuse patchy mid chest opacities bilaterally. Suspicious for
bilateral pneumonia.
2. A gastric tube projects over the mediastinum. The tip is just
below the level of the carina but projects over the LEFT mainstem
bronchus, while potentially in the esophagus the position uncertain.
Consider withdrawing tube with repositioning and follow-up
radiograph.
3. RIGHT-sided PICC line tip at the level of the mid superior vena
cava.

Call is out to the referring provider to further discuss findings in
the above case.

ADDENDUM:
In addition to the nasogastric tube which should be removed and then
repositioned there is a cystic area in the LEFT chest potentially on
the current study this could represent an area of post infectious
pneumatocele at the site of prior pneumonia. Attention on follow-up
was suggested.

These results were called by telephone at the time of interpretation
on [DATE] at [DATE] to provider NUUDHANO , who verbally
acknowledged these results.

ADDENDUM:
In the last addendum it should state "nasogastric tube which should
be removed and then replaced." Remaining verbiage in the addendum is
correct.

*** End of Addendum ***
Addendum:
FINDINGS: Heart size and mediastinal contours are stable compared to prior
imaging.

Diffuse patchy mid chest opacities bilaterally with no sign of lobar
consolidation or visible pleural effusion.

A gastric tube projects over the mediastinum. The tip is just below
the level of the carina but projects over the LEFT mainstem
bronchus, position uncertain.

RIGHT-sided PICC line tip at the level of the mid superior vena
cava.

EKG leads project over the chest.

On limited assessment no acute skeletal process.
IMPRESSION: 1. Diffuse patchy mid chest opacities bilaterally. Suspicious for
bilateral pneumonia.
2. A gastric tube projects over the mediastinum. The tip is just
below the level of the carina but projects over the LEFT mainstem
bronchus, while potentially in the esophagus the position uncertain.
Consider withdrawing tube with repositioning and follow-up
radiograph.
3. RIGHT-sided PICC line tip at the level of the mid superior vena
cava.

Call is out to the referring provider to further discuss findings in
the above case.

ADDENDUM:
In addition to the nasogastric tube which should be removed and then
repositioned there is a cystic area in the LEFT chest potentially on
the current study this could represent an area of post infectious
pneumatocele at the site of prior pneumonia. Attention on follow-up
was suggested.

These results were called by telephone at the time of interpretation
on [DATE] at [DATE] to provider NUUDHANO , who verbally
acknowledged these results.

*** End of Addendum ***
FINDINGS: Heart size and mediastinal contours are stable compared to prior
imaging.

Diffuse patchy mid chest opacities bilaterally with no sign of lobar
consolidation or visible pleural effusion.

A gastric tube projects over the mediastinum. The tip is just below
the level of the carina but projects over the LEFT mainstem
bronchus, position uncertain.

RIGHT-sided PICC line tip at the level of the mid superior vena
cava.

EKG leads project over the chest.

On limited assessment no acute skeletal process.
IMPRESSION: 1. Diffuse patchy mid chest opacities bilaterally. Suspicious for
bilateral pneumonia.
2. A gastric tube projects over the mediastinum. The tip is just
below the level of the carina but projects over the LEFT mainstem
bronchus, while potentially in the esophagus the position uncertain.
Consider withdrawing tube with repositioning and follow-up
radiograph.
3. RIGHT-sided PICC line tip at the level of the mid superior vena
cava.

Call is out to the referring provider to further discuss findings in
the above case.

## 2020-06-28 IMAGING — DX DG CHEST 1V PORT
1 series · 2 of 2 positions shown · non-contrast
Comparison: [DATE]

CLINICAL DATA: NG tube placement

EXAM:
PORTABLE CHEST 1 VIEW

[Series 1: chest ap · 0.14mm/px · 2 of 2 slices shown]
[im 1/2]
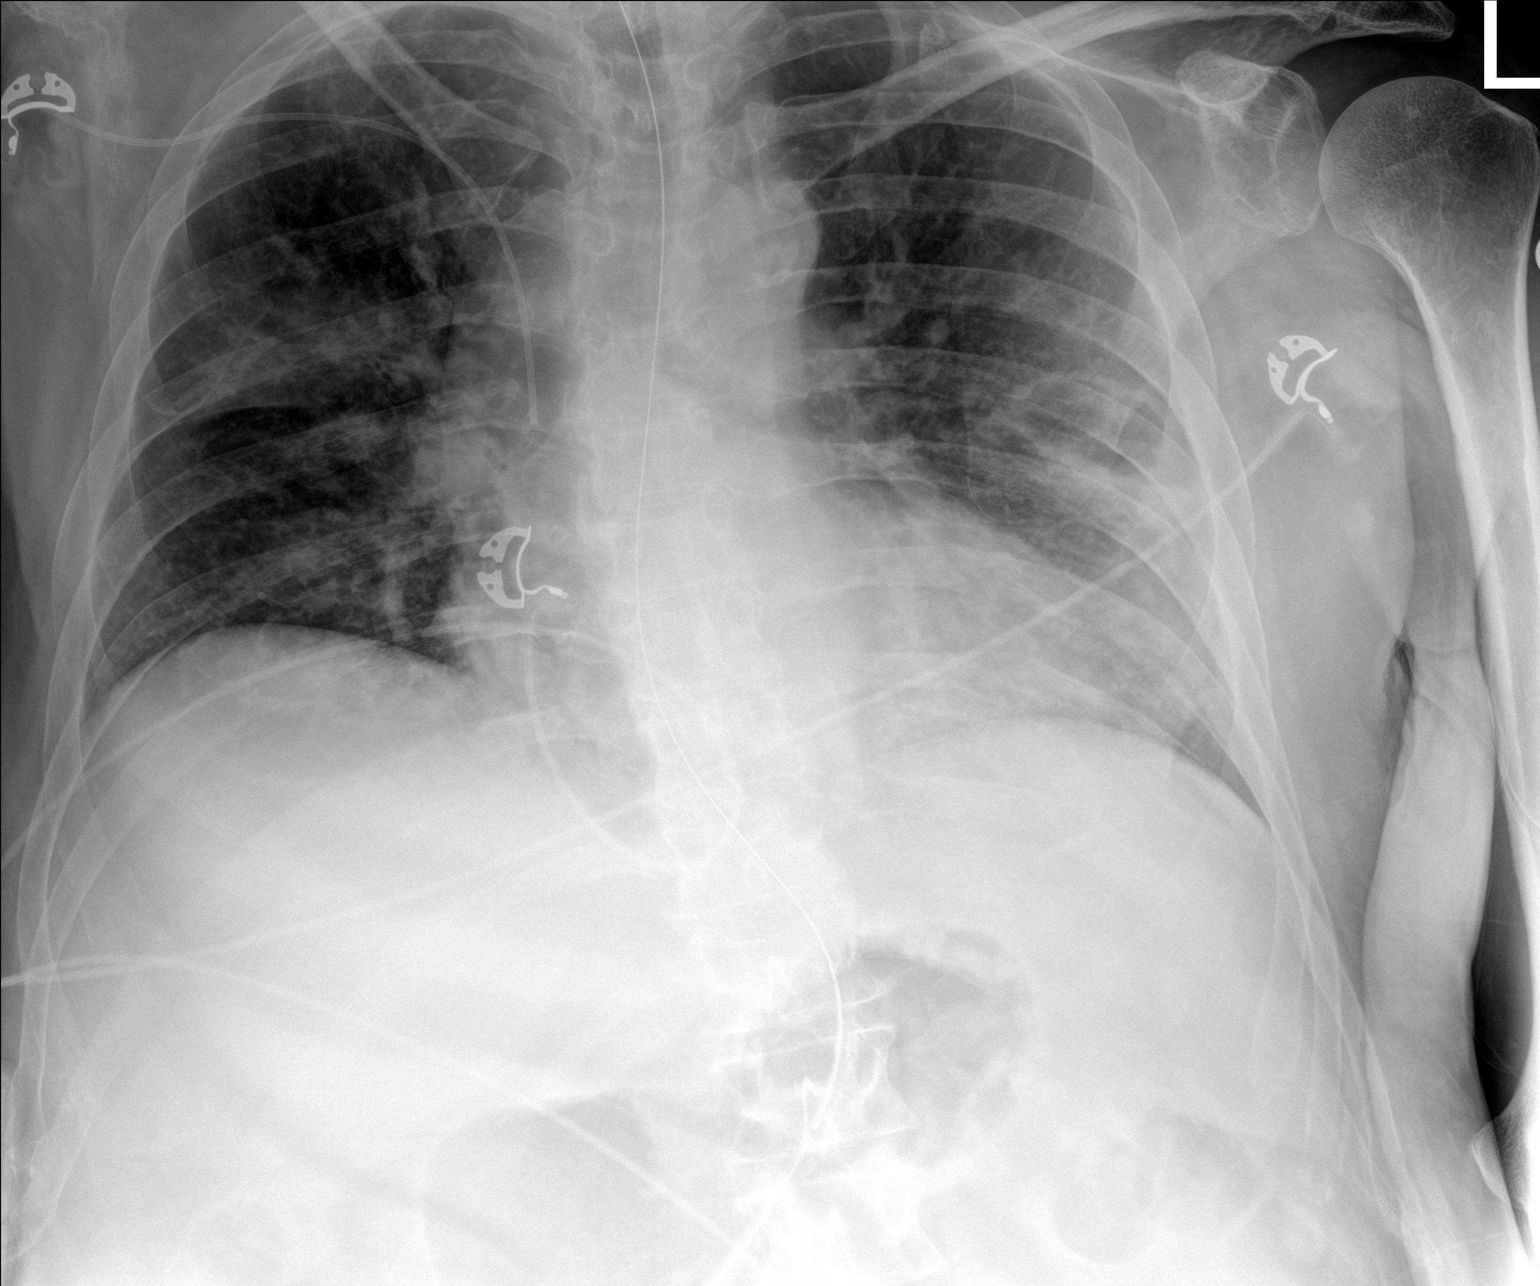
[im 2/2]
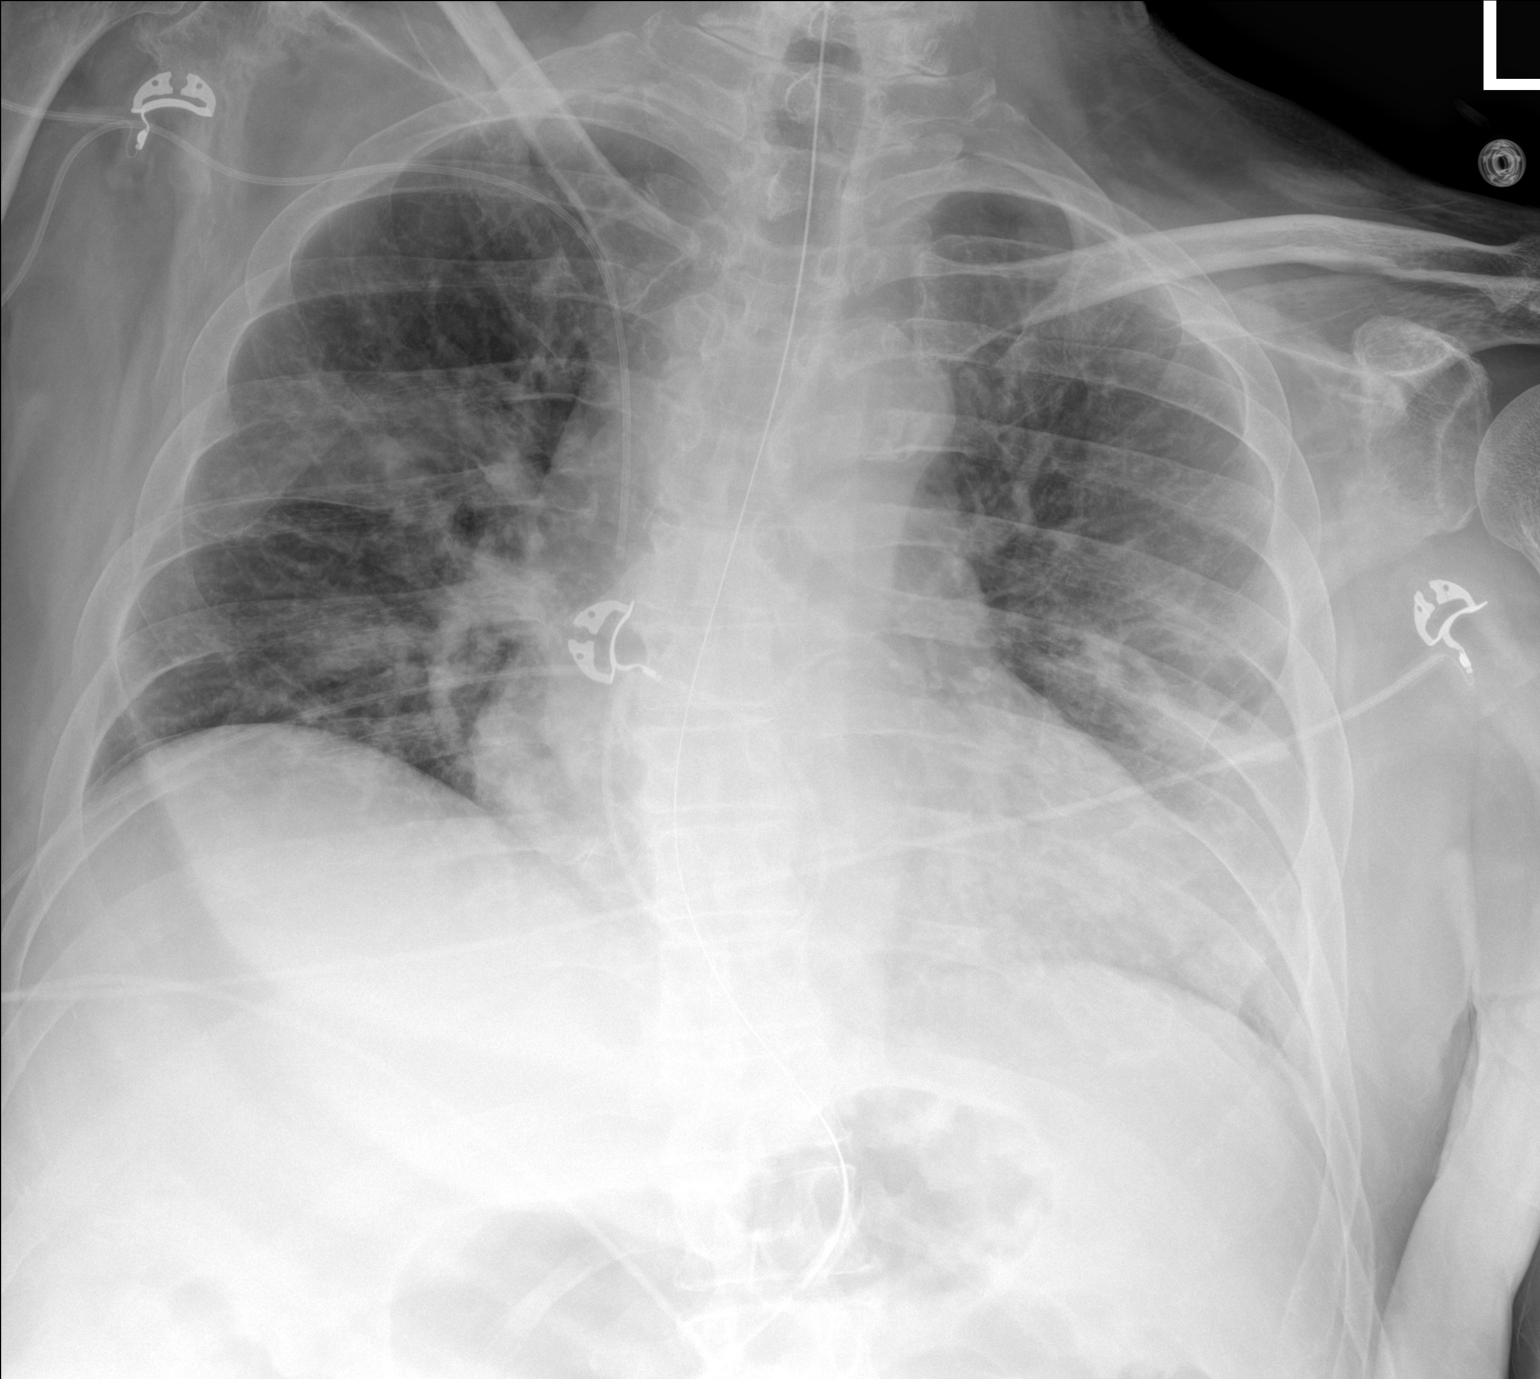

[2 of 2 positions shown; findings below may reference images not displayed]

FINDINGS: Right upper extremity central venous catheter tip over the SVC.
Interval advancement of esophageal tube, tip and side port both
below the diaphragm but incompletely visualized. No significant
change in bilateral airspace opacities
IMPRESSION: Interval advancement of esophageal tube, tip and side port are below
the diaphragm but the tip is incompletely imaged.

## 2020-06-28 MED ORDER — SODIUM CHLORIDE 0.9 % IV SOLN
1.0000 g | Freq: Two times a day (BID) | INTRAVENOUS | Status: DC
  Administered 2020-06-28 – 2020-07-01 (×6): 1 g via INTRAVENOUS
  Filled 2020-06-28 (×8): qty 1

## 2020-06-28 MED ORDER — VANCOMYCIN HCL 1750 MG/350ML IV SOLN
1750.0000 mg | Freq: Once | INTRAVENOUS | Status: AC
  Administered 2020-06-28: 1750 mg via INTRAVENOUS
  Filled 2020-06-28: qty 350

## 2020-06-28 MED ORDER — SODIUM CHLORIDE 0.9 % IV BOLUS
1000.0000 mL | Freq: Once | INTRAVENOUS | Status: AC
  Administered 2020-06-28: 1000 mL via INTRAVENOUS

## 2020-06-28 MED ORDER — VANCOMYCIN VARIABLE DOSE PER UNSTABLE RENAL FUNCTION (PHARMACIST DOSING): Status: DC

## 2020-06-28 NOTE — Progress Notes (Signed)
Palliative:  HPI: 60 y.o. male  with past medical history of hypertension, PTSD, migraines, GERD, neurogenic bladder due to remote history of transverse myelitis admitted on 06/04/2020 with altered mental status due to eft lung pneumonia sepsis requiring intubation. Hospitalization complicated by prolonged vent support, renal failure requiring dialysis, and ongoing encephalopathy.    I met today at Samuele's bedside. No family present. He continues to open eyes and looks at me but then closes them back. I did get a head nod yes or no a couple times but inconsistently. I cannot get him to follow any commands. He does say "help" once softly but no other verbal response. Discussed with SLP who reports continued unawareness of any food/drink when introduced and offered. I also discussed with Dr. Mal Misty who had conversations with family yesterday.   I called and spoke with brother, Frankey Poot. Frankey Poot tells me that he continues to discuss with Earlie Server and confirms that they made the decision for DNR status yesterday. Frankey Poot expresses frustration as they feel that they have seen some progress, admittedly very small improvements, but nonetheless they feel they do see some signs of his personality coming out when they visit with him. I express that it is not uncommon for family to have better response then the medical team. We discussed Cormick's challenges to rehab including inability to participate in therapy sessions and temporary feeding tube. Frankey Poot shares that they definitively want to continue artificial nutrition and they do not "want to starve him to death." Frankey Poot says that Jackob motioned towards drink family member had in his room and said "need" so they do believe he is feeling hunger and thirst. We also discussed that although Thane continues to make urine but I am concerned with the worsening trend of blood work for kidney function. Frankey Poot expresses understanding that his brother is not a good candidate to restart dialysis in  his current state and expresses to me that if his kidney worsen he could accept that this would be leading towards a natural death.   Of note, it does seem per Frankey Poot that Alexandr is fully service connected which could help with placement if indicated and when appropriate.   All questions/concerns addressed. Emotional support provided.   Exam: Minimally responsive - only opens eyes briefly. Said once "help" but no further verbal response or following commands. No distress. Breathing regular, unlabored. Bilateral mittens. Abd soft.  Plan: - DNR status confirmed.  - Family wish to continue artificial nutrition.  - Will request ongoing palliative conversations with my colleagues on Monday.   Roberts, NP Palliative Medicine Team Pager [phone] (Please see [email or web address] for schedule) Team Phone [phone]    Greater than 50%  of this time was spent counseling and coordinating care related to the above assessment and plan

## 2020-06-28 NOTE — Progress Notes (Signed)
[facility]  ROUNDING NOTE   Subjective:   Drowsy. Patient not answering questions.   Will contact daughter to determine plan of care  Tube feedings @ 55 ml/hr Foley  Objective:  Vital signs in last 24 hours:  Temp:  [97.8 F (36.6 C)-98.6 F (37 C)] 98.2 F (36.8 C) (06/24 1109) Pulse Rate:  [97-109] 97 (06/24 1109) Resp:  [16-20] 18 (06/24 1109) BP: (132-177)/(57-83) 147/67 (06/24 1109) SpO2:  [92 %-98 %] 92 % (06/24 1109) Weight:  [82.6 kg] 82.6 kg (06/24 0649)  Weight change: -2.945 kg Filed Weights   06/26/20 0500 06/27/20 0441 06/28/20 0649  Weight: 86.1 kg 85.5 kg 82.6 kg    Intake/Output: I/O last 3 completed shifts: In: 5413.1 [I.V.:30; NG/GT:5333.1; IV Piggyback:50] Out: 3428 [Urine:2340]   Intake/Output this shift:  Total I/O In: 10 [I.V.:10] Out: 550 [Urine:550]  Physical Exam: General: NAD, laying in bed  Head: Normocephalic, atraumatic. Moist oral mucosal membranes, right nostril feeding tube  Eyes: Anicteric  Lungs:  Clear to auscultation  Heart: Regular rate and rhythm  Abdomen:  Soft, nontender  Extremities:  no peripheral edema.  Neurologic: Moving all four extremities. Not responsive to verbal stimuli  Skin: No lesions  GU Foley    Basic Metabolic Panel: Recent Labs  Lab 06/22/20 0542 06/23/20 0454 06/24/20 0504 06/25/20 0558 06/26/20 1040 06/27/20 0630 06/28/20 0624  NA 142 146* 146*  --  143 144  --   K 3.7 3.5 3.8  --  4.2 4.1  --   CL 105 109 110  --  104 105  --   CO2 _0 --  28 28  --   GLUCOSE 170* 175* 251*  --  167* 195*  --   BUN 91* 126* 130*  --  124* 100*  --   CREATININE 3.70* 4.52* 4.94*  --  5.04* 5.41*  --   CALCIUM 8.7* 9.3 9.7  --  10.2 10.5*  --   MG 2.2 2.5* 2.5* 2.4 2.5* 2.8* 2.9*  PHOS 9.3* 8.8* 8.0* 6.6* 7.9* 8.6* 8.5*     Liver Function Tests: Recent Labs  Lab 06/22/20 0542 06/24/20 0504 06/26/20 1040  AST 35 46* 32  ALT 38 72* 68*  ALKPHOS 66 79 82  BILITOT 0.8 0.7 0.6   PROT 6.2* 6.3* 6.4*  ALBUMIN 2.6* 2.9* 2.8*    No results for input(s): LIPASE, AMYLASE in the last 168 hours.  No results for input(s): AMMONIA in the last 168 hours.  CBC: Recent Labs  Lab 06/24/20 0504 06/25/20 0558 06/26/20 1040 06/27/20 0630 06/28/20 0624  WBC 7.1 7.5 8.2 7.0 6.3  NEUTROABS 5.5 5.2 5.9 5.0 4.8  HGB 8.9* 8.5* 8.8* 9.0* 8.9*  HCT 27.0* 25.2* 27.0* 27.1* 27.2*  MCV 89.1 90.3 90.9 90.9 91.0  PLT 245 206 225 230 236     Cardiac Enzymes: No results for input(s): CKTOTAL, CKMB, CKMBINDEX, TROPONINI in the last 168 hours.  BNP: Invalid input(s): POCBNP  CBG: Recent Labs  Lab 06/27/20 2113 06/27/20 2316 06/28/20 0357 06/28/20 0839 06/28/20 1129  GLUCAP 162* 189* 191* 173* 171*     Microbiology: Results for orders placed or performed during the hospital encounter of 06/04/20  Blood culture (single)     Status: Abnormal   Collection Time: 06/04/20  5:18 PM   Specimen: BLOOD  Result Value Ref Range Status   Specimen Description   Final    BLOOD BLOOD RIGHT HAND Performed at [facility],  [place], [place] [address]    Special Requests   Final    BOTTLES DRAWN AEROBIC AND ANAEROBIC Blood Culture adequate volume Performed at [facility], [address] [place], [place] [address]    Culture  Setup Time   Final    GRAM POSITIVE COCCI IN BOTH AEROBIC AND ANAEROBIC BOTTLES CRITICAL RESULT CALLED TO, READ BACK BY AND VERIFIED WITH: Elvera Maria 0102 06/06/2020 DLB Performed at [facility], [address] [place], [place] [address]    Culture (A)  Final    STAPHYLOCOCCUS EPIDERMIDIS THE SIGNIFICANCE OF ISOLATING THIS ORGANISM FROM A SINGLE SET OF BLOOD CULTURES WHEN MULTIPLE SETS ARE DRAWN IS UNCERTAIN. PLEASE NOTIFY THE MICROBIOLOGY DEPARTMENT WITHIN ONE WEEK IF SPECIATION AND SENSITIVITIES ARE REQUIRED. MICROCOCCUS SPECIES Standardized susceptibility testing for this organism is not  available. KOCURIA RHIZOPHILIA Performed at [facility], [place] [address], [place], [place] [address]    Report Status 06/10/2020 FINAL  Final  Resp Panel by RT-PCR (Flu A&B, Covid) Nasopharyngeal Swab     Status: None   Collection Time: 06/04/20  5:18 PM   Specimen: Nasopharyngeal Swab; Nasopharyngeal(NP) swabs in vial transport medium  Result Value Ref Range Status   SARS Coronavirus 2 by RT PCR NEGATIVE NEGATIVE Final    Comment: (NOTE) SARS-CoV-2 target nucleic acids are NOT DETECTED.  The SARS-CoV-2 RNA is generally detectable in upper respiratory specimens during the acute phase of infection. The lowest concentration of SARS-CoV-2 viral copies this assay can detect is 138 copies/mL. A negative result does not preclude SARS-Cov-2 infection and should not be used as the sole basis for treatment or other patient management decisions. A negative result may occur with  improper specimen collection/handling, submission of specimen other than nasopharyngeal swab, presence of viral mutation(s) within the areas targeted by this assay, and inadequate number of viral copies(<138 copies/mL). A negative result must be combined with clinical observations, patient history, and epidemiological information. The expected result is Negative.  Fact Sheet for Patients:  EntrepreneurPulse.com.au  Fact Sheet for Healthcare Providers:  IncredibleEmployment.be  This test is no t yet approved or cleared by the [place] FDA and  has been authorized for detection and/or diagnosis of SARS-CoV-2 by FDA under an Emergency Use Authorization (EUA). This EUA will remain  in effect (meaning this test can be used) for the duration of the COVID-19 declaration under Section 564(b)(1) of the Act, 21 U.S.C.section 360bbb-3(b)(1), unless the authorization is terminated  or revoked sooner.       Influenza A by PCR NEGATIVE NEGATIVE Final   Influenza B by PCR  NEGATIVE NEGATIVE Final    Comment: (NOTE) The Xpert Xpress SARS-CoV-2/FLU/RSV plus assay is intended as an aid in the diagnosis of influenza from Nasopharyngeal swab specimens and should not be used as a sole basis for treatment. Nasal washings and aspirates are unacceptable for Xpert Xpress SARS-CoV-2/FLU/RSV testing.  Fact Sheet for Patients: EntrepreneurPulse.com.au  Fact Sheet for Healthcare Providers: IncredibleEmployment.be  This test is not yet approved or cleared by the [place] FDA and has been authorized for detection and/or diagnosis of SARS-CoV-2 by FDA under an Emergency Use Authorization (EUA). This EUA will remain in effect (meaning this test can be used) for the duration of the COVID-19 declaration under Section 564(b)(1) of the Act, 21 U.S.C. section 360bbb-3(b)(1), unless the authorization is terminated or revoked.  Performed at [facility], [address], [place], [place] [address]   Blood Culture ID Panel (  Reflexed)     Status: Abnormal   Collection Time: 06/04/20  5:18 PM  Result Value Ref Range Status   Enterococcus faecalis NOT DETECTED NOT DETECTED Final   Enterococcus Faecium NOT DETECTED NOT DETECTED Final   Listeria monocytogenes NOT DETECTED NOT DETECTED Final   Staphylococcus species DETECTED (A) NOT DETECTED Final    Comment: CRITICAL RESULT CALLED TO, READ BACK BY AND VERIFIED WITH: [facility] MITCHELL 3151 06/06/2020 DLB    Staphylococcus aureus (BCID) NOT DETECTED NOT DETECTED Final   Staphylococcus epidermidis DETECTED (A) NOT DETECTED Final    Comment: CRITICAL RESULT CALLED TO, READ BACK BY AND VERIFIED WITH: Elvera Maria [id number] 06/06/2020 DLB    Staphylococcus lugdunensis NOT DETECTED NOT DETECTED Final   Streptococcus species NOT DETECTED NOT DETECTED Final   Streptococcus agalactiae NOT DETECTED NOT DETECTED Final   Streptococcus pneumoniae NOT DETECTED NOT DETECTED Final   Streptococcus  pyogenes NOT DETECTED NOT DETECTED Final   A.calcoaceticus-baumannii NOT DETECTED NOT DETECTED Final   Bacteroides fragilis NOT DETECTED NOT DETECTED Final   Enterobacterales NOT DETECTED NOT DETECTED Final   Enterobacter cloacae complex NOT DETECTED NOT DETECTED Final   Escherichia coli NOT DETECTED NOT DETECTED Final   Klebsiella aerogenes NOT DETECTED NOT DETECTED Final   Klebsiella oxytoca NOT DETECTED NOT DETECTED Final   Klebsiella pneumoniae NOT DETECTED NOT DETECTED Final   Proteus species NOT DETECTED NOT DETECTED Final   Salmonella species NOT DETECTED NOT DETECTED Final   Serratia marcescens NOT DETECTED NOT DETECTED Final   Haemophilus influenzae NOT DETECTED NOT DETECTED Final   Neisseria meningitidis NOT DETECTED NOT DETECTED Final   Pseudomonas aeruginosa NOT DETECTED NOT DETECTED Final   Stenotrophomonas maltophilia NOT DETECTED NOT DETECTED Final   Candida albicans NOT DETECTED NOT DETECTED Final   Candida auris NOT DETECTED NOT DETECTED Final   Candida glabrata NOT DETECTED NOT DETECTED Final   Candida krusei NOT DETECTED NOT DETECTED Final   Candida parapsilosis NOT DETECTED NOT DETECTED Final   Candida tropicalis NOT DETECTED NOT DETECTED Final   Cryptococcus neoformans/gattii NOT DETECTED NOT DETECTED Final   Methicillin resistance mecA/C NOT DETECTED NOT DETECTED Final    Comment: Performed at [facility], [address] [place], [place] [address]  Blood culture (single)     Status: None   Collection Time: 06/04/20  9:48 PM   Specimen: BLOOD  Result Value Ref Range Status   Specimen Description BLOOD BLOOD RIGHT ARM  Final   Special Requests   Final    BOTTLES DRAWN AEROBIC AND ANAEROBIC Blood Culture adequate volume   Culture   Final    NO GROWTH 5 DAYS Performed at [facility], [address] [place], [place] [address]    Report Status 06/09/2020 FINAL  Final  Urine Culture     Status: None   Collection Time: 06/06/20  2:37 PM    Specimen: Urine, Random  Result Value Ref Range Status   Specimen Description   Final    URINE, RANDOM Performed at [facility], [address], [place], [place] [address]    Special Requests   Final    NONE Performed at [facility], [address] [place], [place] [address]    Culture   Final    NO GROWTH Performed at [facility], [address], [place],  [address]    Report Status 06/08/2020 FINAL  Final  MRSA PCR Screening     Status: None   Collection  Time: 06/06/20  2:40 PM   Specimen: Nasopharyngeal  Result Value Ref Range Status   MRSA by PCR NEGATIVE NEGATIVE Final    Comment:        The GeneXpert MRSA Assay (FDA approved for NASAL specimens only), is one component of a comprehensive MRSA colonization surveillance program. It is not intended to diagnose MRSA infection nor to guide or monitor treatment for MRSA infections. Performed at [facility], [place], [place] [address]   Acid Fast Smear (AFB)     Status: None   Collection Time: 06/06/20  6:30 PM   Specimen: Bronchoalveolar Lavage; Sputum  Result Value Ref Range Status   AFB Specimen Processing Concentration  Final   Acid Fast Smear Negative  Final    Comment: (NOTE) Performed At: [facility] [place], [place] [id number] Rush Farmer MD ID:[id number]    Source (AFB) BRONCHIAL ALVEOLAR LAVAGE  Corrected    Comment: Performed at [facility], [address] [place], [place] [address] CORRECTED ON 06/02 AT 2239: PREVIOUSLY REPORTED AS SPUTUM   Culture, Respiratory w Gram Stain     Status: None   Collection Time: 06/06/20  6:30 PM   Specimen: Bronchoalveolar Lavage  Result Value Ref Range Status   Specimen Description   Final    BRONCHIAL ALVEOLAR LAVAGE Performed at [facility], [address], [place], [place] [address]    Special Requests   Final    NONE Performed at [facility],  [address] [place], [place] [address]    Gram Stain   Final    FEW WBC PRESENT,BOTH PMN AND MONONUCLEAR NO ORGANISMS SEEN    Culture   Final    NO GROWTH 3 DAYS Performed at [facility], [place] [address], [place], [place] [address]    Report Status 06/09/2020 FINAL  Final  Fungus Culture With Stain     Status: None (Preliminary result)   Collection Time: 06/06/20  6:30 PM  Result Value Ref Range Status   Fungus Stain Final report  Final    Comment: (NOTE) Performed At: [facility] [place], [place] [id number] Rush Farmer MD [id number]    Fungus (Mycology) Culture PENDING  Incomplete   Fungal Source BRONCHIAL ALVEOLAR LAVAGE  Final    Comment: Performed at [facility], [place] [address], [place], [place] [address]  Fungus Culture Result     Status: None   Collection Time: 06/06/20  6:30 PM  Result Value Ref Range Status   Result 1 Comment  Final    Comment: (NOTE) KOH/Calcofluor preparation:  no fungus observed. Performed At: [facility] [place], [place] [id number] Rush Farmer MD DZ:[id number]   Respiratory (~20 pathogens) panel by PCR     Status: None   Collection Time: 06/07/20  5:50 AM   Specimen: Nasopharyngeal Swab; Respiratory  Result Value Ref Range Status   Adenovirus NOT DETECTED NOT DETECTED Final   Coronavirus 229E NOT DETECTED NOT DETECTED Final    Comment: (NOTE) The Coronavirus on the Respiratory Panel, DOES NOT test for the novel  Coronavirus (2019 nCoV)    Coronavirus HKU1 NOT DETECTED NOT DETECTED Final   Coronavirus NL63 NOT DETECTED NOT DETECTED Final   Coronavirus OC43 NOT DETECTED NOT DETECTED Final   Metapneumovirus NOT DETECTED NOT DETECTED Final   Rhinovirus / Enterovirus NOT DETECTED NOT DETECTED Final   Influenza A NOT DETECTED NOT DETECTED Final  Influenza B NOT DETECTED NOT DETECTED Final   Parainfluenza Virus 1 NOT DETECTED NOT DETECTED Final   Parainfluenza Virus 2  NOT DETECTED NOT DETECTED Final   Parainfluenza Virus 3 NOT DETECTED NOT DETECTED Final   Parainfluenza Virus 4 NOT DETECTED NOT DETECTED Final   Respiratory Syncytial Virus NOT DETECTED NOT DETECTED Final   Bordetella pertussis NOT DETECTED NOT DETECTED Final   Bordetella Parapertussis NOT DETECTED NOT DETECTED Final   Chlamydophila pneumoniae NOT DETECTED NOT DETECTED Final   Mycoplasma pneumoniae NOT DETECTED NOT DETECTED Final    Comment: Performed at [facility], [place] [address], [place], [place] [address]  Culture, Respiratory w Gram Stain     Status: None   Collection Time: 06/18/20  4:43 PM   Specimen: Tracheal Aspirate; Respiratory  Result Value Ref Range Status   Specimen Description   Final    TRACHEAL ASPIRATE Performed at [facility], [address] [place], [place] [address]    Special Requests   Final    NONE Performed at [facility], [address] [place], [place] [address]    Gram Stain   Final    MODERATE WBC PRESENT,BOTH PMN AND MONONUCLEAR FEW YEAST RARE GRAM NEGATIVE RODS Performed at [facility], [place] [address], [place], [place] [address]    Culture FEW CANDIDA ALBICANS  Final   Report Status 06/21/2020 FINAL  Final    Coagulation Studies: No results for input(s): LABPROT, INR in the last 72 hours.  Urinalysis: No results for input(s): COLORURINE, LABSPEC, PHURINE, GLUCOSEU, HGBUR, BILIRUBINUR, KETONESUR, PROTEINUR, UROBILINOGEN, NITRITE, LEUKOCYTESUR in the last 72 hours.  Invalid input(s): APPERANCEUR    Imaging: No results found.   Medications:    sodium chloride Stopped (06/22/20 1614)   feeding supplement (NEPRO CARB STEADY) 1,000 mL (06/27/20 1301)   fluconazole (DIFLUCAN) IV 50 mg (06/27/20 2035)    amLODipine  5 mg Per Tube Daily   chlorhexidine gluconate (MEDLINE KIT)  15 mL Mouth Rinse BID   Chlorhexidine Gluconate Cloth  6 each Topical Daily   cloNIDine  0.3 mg Transdermal Weekly   feeding  supplement (PROSource TF)  45 mL Per Tube Daily   free water  100 mL Per Tube Q4H   gabapentin  100 mg Per Tube Q8H   heparin injection (subcutaneous)  5,000 Units Subcutaneous Q8H   insulin aspart  0-20 Units Subcutaneous Q4H   insulin aspart  3 Units Subcutaneous Q4H   insulin glargine  20 Units Subcutaneous Daily   metoprolol tartrate  25 mg Per Tube BID   multivitamin  1 tablet Per Tube QHS   nutrition supplement (JUVEN)  1 packet Per Tube BID   pantoprazole (PROTONIX) IV  40 mg Intravenous Q24H   prazosin  4 mg Per Tube QHS   QUEtiapine  12.5 mg Per Tube BID   sodium chloride flush  10-40 mL Intracatheter Q12H   traZODone  150 mg Per Tube QHS   sodium chloride, haloperidol lactate, heparin sodium (porcine), heparin sodium (porcine), hydrALAZINE, labetalol, ondansetron **OR** ondansetron (ZOFRAN) IV, oxyCODONE-acetaminophen, prazosin, sodium chloride flush  Assessment/ Plan:  Mr. Wyatt Ball. is a 60 y.o. white male with hypertension, PTSD, diabetes mellitus type II, migraines, GERD, neurogenic bladder, history of transverse myelitis, who is admitted to [facility] on 06/04/2020 for Sepsis ([place]) [A41.9] Altered mental status, unspecified altered mental status type [R41.82] Community acquired pneumonia, unspecified laterality [J18.9]  First dialysis was 6/6.   Acute kidney injury: admitting  creatinine of 1.06 with GFR >60. Underlying proteinuria, hematuria, glycosuria consistent with diabetic nephropathy. Patient was also taking ibuprofen regularly at home. Patient with the [place], baseline creatinine not available. Acute kidney injury secondary to sepsis, ATN, IV contrast. Serologic work up negative. First hemodialysis treatment on June 6. Not a candidate for long term dialysis.   - hemodialysis catheter removed.   - Will determine plan of care once we have spoken with daughter. Patient remains a poor candidate for dialysis.   Hypertension: elevated readings, 145/64. Current regimen  of prazosin, amlodipine and clonidine patch. Home regimen of lisinopril and metoprolol, both are on hold.   Diabetes mellitus type II with renal manifestations: noninsulin dependent. holding home metformin. Hemoglobin A1c of 7.4%. Glucose stable  Altered mental status: appreciate neurology input. Prazosin and quetiapine.   Overall prognosis is poor. Not a candidate for further dialysis. Appreciate Palliative care input   LOS: 24   6/24/20221:54 PM

## 2020-06-28 NOTE — Progress Notes (Addendum)
Progress Note    Wyatt Ball.  ZOX:[id number] DOB: 1960-05-10  DOA: 06/04/2020 PCP: System, Provider Not In      Brief Narrative:    Medical records reviewed and are as summarized below:  Wyatt Ball Wyatt Ball. is a 60 y.o. male with medical history significant for hypertension, PTSD, diabetes, migraine, GERD, neurogenic bladder secondary to remote history of transverse myelitis requiring self urethral catheterization, frequent UTIs, chronic back pain on opioids, generally independent at baseline.  He usually gets his care at the [facility] hospital.  He was brought to the emergency room because of altered mental status, cough, shortness of breath, fever, vomiting and diarrhea.  He was admitted to the hospital for sepsis secondary to pneumonia.  He was transferred to the ICU for septic shock, AKI, COPD exacerbation.  He was intubated and placed on mechanical ventilation.  5/21 admitted for sepsis and pneumonia. 6/2 transferred to ICU, intubated underwent bronchoscopy 6/3 self extubated followed by reintubated 6/7 HD started 6/15 extubated 6/19 transferred to [facility]. Neurology, nephrology, urology and infectious disease consulted.   Assessment/Plan:   Principal Problem:   CAP (community acquired pneumonia) Active Problems:   Acute metabolic encephalopathy   Severe sepsis with septic shock (HCC)   Neurogenic bladder   Chronic, continuous use of opioids   Chronic low back pain   Type 2 diabetes mellitus without complication (HCC)   Migraines   Recurrent UTI   History of colon polyps   Urinary retention   Altered mental status   Pressure injury of skin   AKI (acute kidney injury) ([place])   Nutrition Problem: Inadequate oral intake Etiology: inability to eat  Signs/Symptoms: NPO status   Body mass index is 29.39 kg/m.  (Obesity)    Septic shock secondary to community-acquired pneumonia: He completed 8 days of azithromycin, 3 days of cephalosporin plus 5 days of  IV meropenem followed by 7 days of Levaquin and doxycycline.  S/p bronchoscopy blood cultures were negative.  No growth on blood cultures as well.  New fever and tachycardia, new sepsis secondary to bilateral pneumonia: Stat chest x-ray was suspicious for bilateral pneumonia.  Blood cultures, urinalysis, lactic acid and procalcitonin have been ordered.  Start IV fluids and empiric IV antibiotics.  Acute hypoxic respiratory failure: Resolved.  S/p intubation and extubation.  He is tolerating room air.  AKI, hyperphosphatemia: Hemodialysis was initiated on 06/10/2020.  Dialysis catheter has been removed and there is no plan for further hemodialysis.  He is not a good candidate for long-term dialysis.  Case was discussed with nephrologist, Dr. Juleen China.  Acute metabolic encephalopathy, agitation, history of PTSD: Patient has been evaluated by neurologist.  EEG was unremarkable.  Continue psychotropics and prazosin  Dysphagia: Chest x-ray showed the tip of the NG tube is just below the level of the carina but projects over the left mainstem bronchus.  Findings were discussed with radiologist, Dr. Jacalyn Lefevre.  Decision was made to pull out NG tube and reinsert it.  Follow-up chest x-ray will be obtained to confirm proper placement of NG tube.  Hypertension: BP is uncontrolled.  Continue antihypertensives.  Stage II coccygeal decubitus ulcer: Present on admission: Continue foam dressing.  Hypernatremia and hyperkalemia: Improved  Other comorbidities include neurogenic bladder with history of transverse myelitis, chronic pain syndrome on opioids  Plan of care was discussed with his brother, Frankey Poot.  He still maintains that patient is DNR.  However, he is okay with vasopressors in the unlikely event that patient decompensates.  Diet Order             Diet NPO time specified  Diet effective now                      Consultants: Intensivist Urologist Infectious  disease Urologist Nephrologist  Procedures: Intubation and bronchoscopy on on 06/06/2020 Central venous catheter insertion on 06/10/2020    Medications:    amLODipine  5 mg Per Tube Daily   chlorhexidine gluconate (MEDLINE KIT)  15 mL Mouth Rinse BID   Chlorhexidine Gluconate Cloth  6 each Topical Daily   cloNIDine  0.3 mg Transdermal Weekly   feeding supplement (PROSource TF)  45 mL Per Tube Daily   free water  100 mL Per Tube Q4H   gabapentin  100 mg Per Tube Q8H   heparin injection (subcutaneous)  5,000 Units Subcutaneous Q8H   insulin aspart  0-20 Units Subcutaneous Q4H   insulin aspart  3 Units Subcutaneous Q4H   insulin glargine  20 Units Subcutaneous Daily   metoprolol tartrate  25 mg Per Tube BID   multivitamin  1 tablet Per Tube QHS   nutrition supplement (JUVEN)  1 packet Per Tube BID   pantoprazole (PROTONIX) IV  40 mg Intravenous Q24H   prazosin  4 mg Per Tube QHS   QUEtiapine  12.5 mg Per Tube BID   sodium chloride flush  10-40 mL Intracatheter Q12H   traZODone  150 mg Per Tube QHS   Continuous Infusions:  sodium chloride Stopped (06/22/20 1614)   feeding supplement (NEPRO CARB STEADY) 1,000 mL (06/27/20 1301)   fluconazole (DIFLUCAN) IV 50 mg (06/27/20 2035)     Anti-infectives (From admission, onward)    Start     Dose/Rate Route Frequency Ordered Stop   06/26/20 2100  fluconazole (DIFLUCAN) IVPB 50 mg       See Hyperspace for full Linked Orders Report.   50 mg 25 mL/hr over 60 Minutes Intravenous Every 24 hours 06/25/20 1835 07/01/20 2359   06/25/20 2000  fluconazole (DIFLUCAN) IVPB 200 mg       See Hyperspace for full Linked Orders Report.   200 mg 100 mL/hr over 60 Minutes Intravenous  Once 06/25/20 1835 06/25/20 2348   06/14/20 1800  levofloxacin (LEVAQUIN) IVPB 500 mg        500 mg 100 mL/hr over 60 Minutes Intravenous Every 48 hours 06/12/20 1624 06/19/20 0210   06/12/20 1800  levofloxacin (LEVAQUIN) IVPB 750 mg        750 mg 100 mL/hr over 90  Minutes Intravenous  Once 06/11/20 2239 06/12/20 1904   06/11/20 1800  meropenem (MERREM) 500 mg in sodium chloride 0.9 % 100 mL IVPB  Status:  Discontinued        500 mg 200 mL/hr over 30 Minutes Intravenous Every 24 hours 06/10/20 1444 06/11/20 2231   06/10/20 1300  doxycycline (VIBRAMYCIN) 100 mg in sodium chloride 0.9 % 250 mL IVPB  Status:  Discontinued        100 mg 125 mL/hr over 120 Minutes Intravenous Every 12 hours 06/10/20 1149 06/17/20 1704   06/09/20 1015  meropenem (MERREM) 500 mg in sodium chloride 0.9 % 100 mL IVPB        500 mg 200 mL/hr over 30 Minutes Intravenous Every 12 hours 06/09/20 0922 06/11/20 1203   06/08/20 2200  meropenem (MERREM) 1 g in sodium chloride 0.9 % 100 mL IVPB  Status:  Discontinued  1 g 200 mL/hr over 30 Minutes Intravenous Every 12 hours 06/08/20 0845 06/09/20 0922   06/07/20 2200  azithromycin (ZITHROMAX) 500 mg in sodium chloride 0.9 % 250 mL IVPB        500 mg 250 mL/hr over 60 Minutes Intravenous Every 24 hours 06/07/20 1441 06/11/20 2211   06/07/20 1600  ceFEPIme (MAXIPIME) 2 g in sodium chloride 0.9 % 100 mL IVPB  Status:  Discontinued        2 g 200 mL/hr over 30 Minutes Intravenous Every 8 hours 06/07/20 1441 06/07/20 1442   06/07/20 1600  meropenem (MERREM) 1 g in sodium chloride 0.9 % 100 mL IVPB  Status:  Discontinued        1 g 200 mL/hr over 30 Minutes Intravenous Every 8 hours 06/07/20 1442 06/08/20 0845   06/06/20 1630  vancomycin (VANCOREADY) IVPB 1500 mg/300 mL  Status:  Discontinued        1,500 mg 150 mL/hr over 120 Minutes Intravenous  Once 06/06/20 1533 06/06/20 1632   06/05/20 0600  cefTRIAXone (ROCEPHIN) 2 g in sodium chloride 0.9 % 100 mL IVPB  Status:  Discontinued        2 g 200 mL/hr over 30 Minutes Intravenous Every 24 hours 06/04/20 2029 06/07/20 1439   06/04/20 2200  azithromycin (ZITHROMAX) 500 mg in sodium chloride 0.9 % 250 mL IVPB        500 mg 250 mL/hr over 60 Minutes Intravenous Every 24 hours 06/04/20  2029 06/07/20 0003   06/04/20 1915  ceFEPIme (MAXIPIME) 2 g in sodium chloride 0.9 % 100 mL IVPB        2 g 200 mL/hr over 30 Minutes Intravenous  Once 06/04/20 1906 06/04/20 2055   06/04/20 1915  metroNIDAZOLE (FLAGYL) IVPB 500 mg        500 mg 100 mL/hr over 60 Minutes Intravenous  Once 06/04/20 1906 06/04/20 2055   06/04/20 1915  vancomycin (VANCOCIN) IVPB 1000 mg/200 mL premix  Status:  Discontinued        1,000 mg 200 mL/hr over 60 Minutes Intravenous  Once 06/04/20 1906 06/04/20 1912   06/04/20 1915  vancomycin (VANCOREADY) IVPB 1750 mg/350 mL        1,750 mg 175 mL/hr over 120 Minutes Intravenous  Once 06/04/20 1912 06/04/20 2257              Family Communication/Anticipated D/C date and plan/Code Status   DVT prophylaxis: heparin injection 5,000 Units Start: 06/10/20 2200     Code Status: DNR  Family Communication: Frankey Poot, brother Disposition Plan:    Status is: Inpatient  Remains inpatient appropriate because:Inpatient level of care appropriate due to severity of illness  Dispo: The patient is from: Home              Anticipated d/c is to: SNF              Patient currently is not medically stable to d/c.   Difficult to place patient No           Subjective:   Interval events noted.  Patient had fever with T-max of 101.4 F per rectum.   Objective:    Vitals:   06/28/20 0649 06/28/20 0836 06/28/20 1109 06/28/20 1556  BP:  (!) 164/79 (!) 147/67 (!) 166/65  Pulse:  (!) 106 97 (!) 120  Resp:  20 18 18   Temp:  98.4 F (36.9 C) 98.2 F (36.8 C) (!) 101.4 F (38.6 C)  TempSrc:  Oral Oral   SpO2:  93% 92% 92%  Weight: 82.6 kg     Height:       No data found.   Intake/Output Summary (Last 24 hours) at 06/28/2020 1654 Last data filed at 06/28/2020 1607 Gross per 24 hour  Intake 140 ml  Output 2500 ml  Net -2360 ml   Filed Weights   06/26/20 0500 06/27/20 0441 06/28/20 0649  Weight: 86.1 kg 85.5 kg 82.6 kg    Exam:   GEN:  NAD SKIN: Warm and dry EYES: No acute abnormality ENT: MMM, NG tube in place CV: RRR PULM: CTA B ABD: soft, ND, +BS CNS: Drowsy EXT: drowsy/lethargic and difficult to arouse No edema or tenderness GU: Foley catheter draining amber urine       Data Reviewed:   I have personally reviewed following labs and imaging studies:  Labs: Labs show the following:   Basic Metabolic Panel: Recent Labs  Lab 06/22/20 0542 06/23/20 0454 06/24/20 0504 06/25/20 0558 06/26/20 1040 06/27/20 0630 06/28/20 0624  NA 142 146* 146*  --  143 144  --   K 3.7 3.5 3.8  --  4.2 4.1  --   CL 105 109 110  --  104 105  --   CO2 27 27 28   --  28 28  --   GLUCOSE 170* 175* 251*  --  167* 195*  --   BUN 91* 126* 130*  --  124* 100*  --   CREATININE 3.70* 4.52* 4.94*  --  5.04* 5.41*  --   CALCIUM 8.7* 9.3 9.7  --  10.2 10.5*  --   MG 2.2 2.5* 2.5* 2.4 2.5* 2.8* 2.9*  PHOS 9.3* 8.8* 8.0* 6.6* 7.9* 8.6* 8.5*   GFR Estimated Creatinine Clearance: 14.5 mL/min (A) (by C-G formula based on SCr of 5.41 mg/dL (H)). Liver Function Tests: Recent Labs  Lab 06/22/20 0542 06/24/20 0504 06/26/20 1040  AST 35 46* 32  ALT 38 72* 68*  ALKPHOS 66 79 82  BILITOT 0.8 0.7 0.6  PROT 6.2* 6.3* 6.4*  ALBUMIN 2.6* 2.9* 2.8*   No results for input(s): LIPASE, AMYLASE in the last 168 hours.  No results for input(s): AMMONIA in the last 168 hours. Coagulation profile No results for input(s): INR, PROTIME in the last 168 hours.  CBC: Recent Labs  Lab 06/24/20 0504 06/25/20 0558 06/26/20 1040 06/27/20 0630 06/28/20 0624  WBC 7.1 7.5 8.2 7.0 6.3  NEUTROABS 5.5 5.2 5.9 5.0 4.8  HGB 8.9* 8.5* 8.8* 9.0* 8.9*  HCT 27.0* 25.2* 27.0* 27.1* 27.2*  MCV 89.1 90.3 90.9 90.9 91.0  PLT 245 206 225 230 236   Cardiac Enzymes: No results for input(s): CKTOTAL, CKMB, CKMBINDEX, TROPONINI in the last 168 hours. BNP (last 3 results) No results for input(s): PROBNP in the last 8760 hours. CBG: Recent Labs  Lab  06/27/20 2316 06/28/20 0357 06/28/20 0839 06/28/20 1129 06/28/20 1548  GLUCAP 189* 191* 173* 171* 210*   D-Dimer: No results for input(s): DDIMER in the last 72 hours. Hgb A1c: No results for input(s): HGBA1C in the last 72 hours. Lipid Profile: No results for input(s): CHOL, HDL, LDLCALC, TRIG, CHOLHDL, LDLDIRECT in the last 72 hours. Thyroid function studies: No results for input(s): TSH, T4TOTAL, T3FREE, THYROIDAB in the last 72 hours.  Invalid input(s): FREET3 Anemia work up: No results for input(s): VITAMINB12, FOLATE, FERRITIN, TIBC, IRON, RETICCTPCT in the last 72 hours. Sepsis Labs: Recent Labs  Lab 06/25/20 0558 06/26/20 1040  06/27/20 0630 06/28/20 0624  WBC 7.5 8.2 7.0 6.3    Microbiology No results found for this or any previous visit (from the past 240 hour(s)).   Procedures and diagnostic studies:  No results found.             LOS: 24 days   [facility]   Pager on [email or web address]. If 7PM-7AM, please contact night-coverage at [email or web address]     06/28/2020, 4:54 PM

## 2020-06-28 NOTE — Consult Note (Addendum)
Pharmacy Antibiotic Note  Wyatt Ball. is a 60 y.o. male admitted on 06/04/2020 with sepsis.  Patient with blood cultures on 5/31 revealing MSSE bacteremia and treated with Ceftriaxone and Azithromycin initially, then eventually switched to levaquin and doxycycline( for potential TBI) for a total of 14 days of therapy. However,  recent chest x-ray shows bilateral pneumonia. Repeat blood cultures, urinalysis, lactic acid and procalcitonin have been ordered. Pharmacy has been consulted for Vancomycin and Meropenem dosing.  Plan: 1) Vancomycin 1750mg  IV x 1 as loading dose. Due to patient's AKI and and poor candidacy for long term dialysis with no further dialysis sessions planned, will dose off of random Vancomycin levels currently.  --Vr ordered for 06/29/20@2000 .  2) Meropenem 1g Q12 hours    Height: 5' 5.98" (167.6 cm) Weight: 82.6 kg (182 lb) IBW/kg (Calculated) : 63.76  Temp (24hrs), Avg:98.8 F (37.1 C), Min:98 F (36.7 C), Max:101.4 F (38.6 C)  Recent Labs  Lab 06/22/20 0542 06/23/20 0454 06/24/20 0504 06/25/20 0558 06/26/20 1040 06/27/20 0630 06/28/20 0624  WBC 7.0 8.1 7.1 7.5 8.2 7.0 6.3  CREATININE 3.70* 4.52* 4.94*  --  5.04* 5.41*  --     Estimated Creatinine Clearance: 14.5 mL/min (A) (by C-G formula based on SCr of 5.41 mg/dL (H)).    No Known Allergies  Antimicrobials this admission: Vanc/Mero 6/24 >>    Microbiology results: 6/24 BCx: pending 5/31 Bcx: MSSE 6/2 UCx: NG    Thank you for allowing pharmacy to be a part of this patient's care.  8/2 06/28/2020 5:41 PM

## 2020-06-28 NOTE — Consult Note (Signed)
Pharmacy Antibiotic Note  Wyatt Delrosario Montez Hageman. is a 60 y.o. male admitted on 06/04/2020 with prolonged hospitalization with septic shock, acute renal failure requiring HD, and acute respiratory requiring intubation and ICU stay. 5/21 admitted for sepsis and pneumonia. 6/2 transferred to ICU, intubated underwent bronchoscopy 6/3 self extubated followed by reintubated 6/7 HD started 6/15 extubated 6/19 transferred to [facility]. 6/21 Pharmacy has been consulted for fluconazole dosing for oropharyngeal candidiasis  CrCl 16.1 -- per nephrology holding off on further HD sessions at this time  Plan: Continue fluconazole 50 mg daily x 7 days total. Adjusted per renal function.   Height: 5' 5.98" (167.6 cm) Weight: 82.6 kg (182 lb) IBW/kg (Calculated) : 63.76  Temp (24hrs), Avg:98.4 F (36.9 C), Min:97.8 F (36.6 C), Max:99.4 F (37.4 C)  Recent Labs  Lab 06/22/20 0542 06/23/20 0454 06/24/20 0504 06/25/20 0558 06/26/20 1040 06/27/20 0630 06/28/20 0624  WBC 7.0 8.1 7.1 7.5 8.2 7.0 6.3  CREATININE 3.70* 4.52* 4.94*  --  5.04* 5.41*  --      Estimated Creatinine Clearance: 14.5 mL/min (A) (by C-G formula based on SCr of 5.41 mg/dL (H)).    No Known Allergies   Dose adjustments this admission: N/a  Microbiology results: 6/14 Resp culture: few candida albicans 6/02 fugus culture result: NG   Thank you for allowing pharmacy to be a part of this patient's care.  Ronnald Ramp, PharmD, BCPS Clinical Pharmacist   06/28/2020 9:34 AM

## 2020-06-29 DIAGNOSIS — G9341 Metabolic encephalopathy: Secondary | ICD-10-CM | POA: Diagnosis not present

## 2020-06-29 DIAGNOSIS — N179 Acute kidney failure, unspecified: Secondary | ICD-10-CM | POA: Diagnosis not present

## 2020-06-29 DIAGNOSIS — J189 Pneumonia, unspecified organism: Secondary | ICD-10-CM | POA: Diagnosis not present

## 2020-06-29 LAB — CBC WITH DIFFERENTIAL/PLATELET
Abs Immature Granulocytes: 0.04 10*3/uL (ref 0.00–0.07)
Basophils Absolute: 0 10*3/uL (ref 0.0–0.1)
Basophils Relative: 0 %
Eosinophils Absolute: 0 10*3/uL (ref 0.0–0.5)
Eosinophils Relative: 0 %
HCT: 25.8 % — ABNORMAL LOW (ref 39.0–52.0)
Hemoglobin: 8.3 g/dL — ABNORMAL LOW (ref 13.0–17.0)
Immature Granulocytes: 0 %
Lymphocytes Relative: 12 %
Lymphs Abs: 1.2 10*3/uL (ref 0.7–4.0)
MCH: 29.6 pg (ref 26.0–34.0)
MCHC: 32.2 g/dL (ref 30.0–36.0)
MCV: 92.1 fL (ref 80.0–100.0)
Monocytes Absolute: 0.7 10*3/uL (ref 0.1–1.0)
Monocytes Relative: 7 %
Neutro Abs: 8.2 10*3/uL — ABNORMAL HIGH (ref 1.7–7.7)
Neutrophils Relative %: 81 %
Platelets: 257 10*3/uL (ref 150–400)
RBC: 2.8 MIL/uL — ABNORMAL LOW (ref 4.22–5.81)
RDW: 15.5 % (ref 11.5–15.5)
WBC: 10.1 10*3/uL (ref 4.0–10.5)
nRBC: 0 % (ref 0.0–0.2)

## 2020-06-29 LAB — MAGNESIUM: Magnesium: 2.9 mg/dL — ABNORMAL HIGH (ref 1.7–2.4)

## 2020-06-29 LAB — BASIC METABOLIC PANEL
Anion gap: 13 (ref 5–15)
BUN: 164 mg/dL — ABNORMAL HIGH (ref 8–23)
CO2: 23 mmol/L (ref 22–32)
Calcium: 10 mg/dL (ref 8.9–10.3)
Chloride: 109 mmol/L (ref 98–111)
Creatinine, Ser: 6.09 mg/dL — ABNORMAL HIGH (ref 0.61–1.24)
GFR, Estimated: 10 mL/min — ABNORMAL LOW (ref >60)
Glucose, Bld: 153 mg/dL — ABNORMAL HIGH (ref 70–99)
Potassium: 4 mmol/L (ref 3.5–5.1)
Sodium: 145 mmol/L (ref 135–145)

## 2020-06-29 LAB — GLUCOSE, CAPILLARY
Glucose-Capillary: 108 mg/dL — ABNORMAL HIGH (ref 70–99)
Glucose-Capillary: 108 mg/dL — ABNORMAL HIGH (ref 70–99)
Glucose-Capillary: 137 mg/dL — ABNORMAL HIGH (ref 70–99)
Glucose-Capillary: 139 mg/dL — ABNORMAL HIGH (ref 70–99)
Glucose-Capillary: 180 mg/dL — ABNORMAL HIGH (ref 70–99)
Glucose-Capillary: 208 mg/dL — ABNORMAL HIGH (ref 70–99)

## 2020-06-29 LAB — PHOSPHORUS: Phosphorus: 7.7 mg/dL — ABNORMAL HIGH (ref 2.5–4.6)

## 2020-06-29 LAB — PROCALCITONIN: Procalcitonin: 1.28 ng/mL

## 2020-06-29 LAB — MRSA NEXT GEN BY PCR, NASAL: MRSA by PCR Next Gen: NOT DETECTED

## 2020-06-29 LAB — VANCOMYCIN, RANDOM: Vancomycin Rm: 21

## 2020-06-29 MED ORDER — NEPRO/CARBSTEADY PO LIQD: 1000.0000 mL | ORAL | Status: DC

## 2020-06-29 NOTE — Progress Notes (Signed)
[facility]  PROGRESS NOTE   Subjective:   Patient seen at bedside.  Responds poorly to questions. Prior renal notes appreciated.  Objective:  Vital signs in last 24 hours:  Temp:  [98.1 F (36.7 C)-101.4 F (38.6 C)] 98.1 F (36.7 C) (06/25 1230) Pulse Rate:  [92-120] 92 (06/25 1230) Resp:  [16-18] 16 (06/25 1230) BP: (143-166)/(62-78) 161/78 (06/25 1230) SpO2:  [92 %-98 %] 94 % (06/25 1230) Weight:  [85 kg] 85 kg (06/25 0500)  Weight change: 2.445 kg Filed Weights   06/27/20 0441 06/28/20 0649 06/29/20 0500  Weight: 85.5 kg 82.6 kg 85 kg    Intake/Output: I/O last 3 completed shifts: In: 678.3 [I.V.:70; NG/GT:483; IV Piggyback:125.3] Out: 2200 [Urine:1850; Emesis/NG output:350]   Intake/Output this shift:  Total I/O In: 10 [I.V.:10] Out: -   Physical Exam: General:  No acute distress  Head:  Normocephalic, atraumatic. Moist oral mucosal membranes  Eyes:  Anicteric  Neck:  Supple  Lungs:   Clear to auscultation, normal effort  Heart:  S1S2 no rubs  Abdomen:   Soft, nontender, bowel sounds present  Extremities:  peripheral edema.  Neurologic:  Awake, alert, following commands  Skin:  No lesions  Access:     Basic Metabolic Panel: Recent Labs  Lab 06/23/20 0454 06/24/20 0504 06/25/20 0558 06/26/20 1040 06/27/20 0630 06/28/20 0624 06/29/20 0500  NA 146* 146*  --  143 144  --  145  K 3.5 3.8  --  4.2 4.1  --  4.0  CL 109 110  --  104 105  --  109  CO2 27 28  --  28 28  --  23  GLUCOSE 175* 251*  --  167* 195*  --  153*  BUN 126* 130*  --  124* 100*  --  164*  CREATININE 4.52* 4.94*  --  5.04* 5.41*  --  6.09*  CALCIUM 9.3 9.7  --  10.2 10.5*  --  10.0  MG 2.5* 2.5* 2.4 2.5* 2.8* 2.9* 2.9*  PHOS 8.8* 8.0* 6.6* 7.9* 8.6* 8.5* 7.7*    CBC: Recent Labs  Lab 06/25/20 0558 06/26/20 1040 06/27/20 0630 06/28/20 0624 06/29/20 0500  WBC 7.5 8.2 7.0 6.3 10.1  NEUTROABS 5.2 5.9 5.0 4.8 8.2*  HGB 8.5* 8.8* 9.0* 8.9* 8.3*  HCT 25.2*  27.0* 27.1* 27.2* 25.8*  MCV 90.3 90.9 90.9 91.0 92.1  PLT 206 225 230 236 257     Urinalysis: Recent Labs    06/28/20 1613  COLORURINE YELLOW*  LABSPEC 1.014  PHURINE 5.0  GLUCOSEU 50*  HGBUR MODERATE*  BILIRUBINUR NEGATIVE  KETONESUR NEGATIVE  PROTEINUR 30*  NITRITE NEGATIVE  LEUKOCYTESUR LARGE*      Imaging: DG Chest 1 View  Addendum Date: 06/28/2020   ADDENDUM REPORT: 06/28/2020 20:08 ADDENDUM: The proximal portion of the NG tube is likely coiled in the hypopharynx or proximal esophagus. Tip remains at the GE junction. These results were called by telephone at the time of interpretation on 06/28/2020 at 8:08 pm to provider Dr. Sharlene Motts, Who verbally acknowledged these results. Electronically Signed   By: Zetta Bills M.D.   On: 06/28/2020 20:08   Result Date: 06/28/2020 CLINICAL DATA:  Nasogastric tube placement. EXAM: CHEST  1 VIEW COMPARISON:  June 28, 2020. FINDINGS: RIGHT-sided PICC line in stable position. Nasogastric tube in similar position tip at the EG junction. A redundant loop of the nasogastric tube is likely in the hypopharynx. Cardiomediastinal contours and hilar structures are stable. Mild increased  interstitial markings with diffuse patchy opacities and potential cystic change in the LEFT chest unchanged. On limited assessment no acute skeletal process. IMPRESSION: 1. Nasogastric tube tip in the distal esophagus, tube likely coiled in the hypopharynx. 2. Stable bilateral interstitial and airspace disease. A call is out to the referring provider to further discuss findings in the above case. Electronically Signed: By: Zetta Bills M.D. On: 06/28/2020 19:46   DG Chest 1 View  Addendum Date: 06/28/2020   ADDENDUM REPORT: 06/28/2020 19:03 ADDENDUM: Correction to emergent results call time. Correct time should be 06/28/2020 at 7 o'clock p.m. Electronically Signed   By: Donavan Foil M.D.   On: 06/28/2020 19:03   Result Date: 06/28/2020 CLINICAL DATA:  NG tube  placement EXAM: CHEST  1 VIEW COMPARISON:  06/28/2020 FINDINGS: Right upper extremity central venous catheter tip over the SVC. Esophageal tube tip overlies the distal esophagus/GE junction region. Cardiomegaly. Similar patchy bilateral airspace opacities. IMPRESSION: 1. Esophageal tube tip overlies the distal esophagus/GE junction region, consider further advancement for more optimal positioning. 2. Similar cardiomegaly and patchy bilateral airspace disease Critical Value/emergent results were called by telephone at the time of interpretation on 06/28/2020 at 6:28 pm to provider Jennye Boroughs , who verbally acknowledged these results. Electronically Signed: By: Donavan Foil M.D. On: 06/28/2020 18:28   DG Chest Port 1 View  Result Date: 06/28/2020 CLINICAL DATA:  NG tube placement EXAM: PORTABLE CHEST 1 VIEW COMPARISON:  06/28/2020 FINDINGS: Right upper extremity central venous catheter tip over the SVC. Interval advancement of esophageal tube, tip and side port both below the diaphragm but incompletely visualized. No significant change in bilateral airspace opacities IMPRESSION: Interval advancement of esophageal tube, tip and side port are below the diaphragm but the tip is incompletely imaged. Electronically Signed   By: Donavan Foil M.D.   On: 06/28/2020 21:36   DG Chest Port 1 View  Addendum Date: 06/28/2020   ADDENDUM REPORT: 06/28/2020 17:09 ADDENDUM: In the last addendum it should state "nasogastric tube which should be removed and then replaced." Remaining verbiage in the addendum is correct. Electronically Signed   By: Zetta Bills M.D.   On: 06/28/2020 17:09   Addendum Date: 06/28/2020   ADDENDUM REPORT: 06/28/2020 17:05 ADDENDUM: In addition to the nasogastric tube which should be removed and then repositioned there is a cystic area in the LEFT chest potentially on the current study this could represent an area of post infectious pneumatocele at the site of prior pneumonia. Attention on  follow-up was suggested. These results were called by telephone at the time of interpretation on 06/28/2020 at 5:04 pm to provider Jennye Boroughs , who verbally acknowledged these results. Electronically Signed   By: Zetta Bills M.D.   On: 06/28/2020 17:05   Result Date: 06/28/2020 CLINICAL DATA:  Unconscious, fever. EXAM: PORTABLE CHEST 1 VIEW COMPARISON:  Chest x-ray from June 18, 2020. FINDINGS: Heart size and mediastinal contours are stable compared to prior imaging. Diffuse patchy mid chest opacities bilaterally with no sign of lobar consolidation or visible pleural effusion. A gastric tube projects over the mediastinum. The tip is just below the level of the carina but projects over the LEFT mainstem bronchus, position uncertain. RIGHT-sided PICC line tip at the level of the mid superior vena cava. EKG leads project over the chest. On limited assessment no acute skeletal process. IMPRESSION: 1. Diffuse patchy mid chest opacities bilaterally. Suspicious for bilateral pneumonia. 2. A gastric tube projects over the mediastinum. The tip is just  below the level of the carina but projects over the LEFT mainstem bronchus, while potentially in the esophagus the position uncertain. Consider withdrawing tube with repositioning and follow-up radiograph. 3. RIGHT-sided PICC line tip at the level of the mid superior vena cava. Call is out to the referring provider to further discuss findings in the above case. Electronically Signed: By: Zetta Bills M.D. On: 06/28/2020 16:53     Medications:    sodium chloride Stopped (06/22/20 1614)   feeding supplement (NEPRO CARB STEADY) 55 mL/hr at 06/29/20 0624   fluconazole (DIFLUCAN) IV 50 mg (06/28/20 2100)   meropenem (MERREM) IV 1 g (06/29/20 1021)    amLODipine  5 mg Per Tube Daily   chlorhexidine gluconate (MEDLINE KIT)  15 mL Mouth Rinse BID   Chlorhexidine Gluconate Cloth  6 each Topical Daily   cloNIDine  0.3 mg Transdermal Weekly   feeding supplement  (PROSource TF)  45 mL Per Tube Daily   free water  100 mL Per Tube Q4H   gabapentin  100 mg Per Tube Q8H   heparin injection (subcutaneous)  5,000 Units Subcutaneous Q8H   insulin aspart  0-20 Units Subcutaneous Q4H   insulin aspart  3 Units Subcutaneous Q4H   insulin glargine  20 Units Subcutaneous Daily   metoprolol tartrate  25 mg Per Tube BID   multivitamin  1 tablet Per Tube QHS   nutrition supplement (JUVEN)  1 packet Per Tube BID   pantoprazole (PROTONIX) IV  40 mg Intravenous Q24H   prazosin  4 mg Per Tube QHS   QUEtiapine  12.5 mg Per Tube BID   sodium chloride flush  10-40 mL Intracatheter Q12H   traZODone  150 mg Per Tube QHS   vancomycin variable dose per unstable renal function (pharmacist dosing)   Does not apply See admin instructions    Assessment/ Plan:     Principal Problem:   CAP (community acquired pneumonia) Active Problems:   Acute metabolic encephalopathy   Severe sepsis with septic shock (HCC)   Neurogenic bladder   Chronic, continuous use of opioids   Chronic low back pain   Type 2 diabetes mellitus without complication (HCC)   Migraines   Recurrent UTI   History of colon polyps   Urinary retention   Altered mental status   Pressure injury of skin   AKI (acute kidney injury) ([place])  60 year old male with history of diabetes, PTSD, peripheral vascular disease, hypertension, GERD, history of transverse myelitis with neurogenic bladder now with worsening renal indicis. Patient has worsening renal indicis but is not a candidate for long-term hemodialysis.  The dialysis catheter was removed.  Palliative care note appreciated.   LOS: [place], [facility] 6/25/20222:58 PM

## 2020-06-29 NOTE — Progress Notes (Signed)
   06/28/20 1556  Assess: MEWS Score  Temp (!) 101.4 F (38.6 C)  BP (!) 166/65  Pulse Rate (!) 120  Resp 18  SpO2 92 %  O2 Device Room Air  Assess: MEWS Score  MEWS Temp 1  MEWS Systolic 0  MEWS Pulse 2  MEWS RR 0  MEWS LOC 0  MEWS Score 3  MEWS Score Color Yellow  Assess: if the MEWS score is Yellow or Red  Were vital signs taken at a resting state? Yes  Focused Assessment No change from prior assessment  Does the patient meet 2 or more of the SIRS criteria? No  Early Detection of Sepsis Score *See Row Information* Low  MEWS guidelines implemented *See Row Information* Yes  Treat  MEWS Interventions Escalated (See documentation below)  Pain Scale PAINAD  Pain Score 0  Faces Pain Scale 0  Breathing 0  Take Vital Signs  Increase Vital Sign Frequency  Yellow: Q 2hr X 2 then Q 4hr X 2, if remains yellow, continue Q 4hrs  Escalate  MEWS: Escalate Yellow: discuss with charge nurse/RN and consider discussing with provider and RRT  Notify: Charge Nurse/RN  Name of Charge Nurse/RN Notified Lillia Abed  Date Charge Nurse/RN Notified 06/28/20  Time Charge Nurse/RN Notified 1601  Notify: Provider  Provider Name/Title Myriam Forehand, MD  Date Provider Notified 06/28/20  Time Provider Notified 1601  Notification Type Page  Notification Reason Other (Comment) ([place] Yellow)  Provider response See new orders  Date of Provider Response 06/28/20  Time of Provider Response 1603  Document  Patient Outcome Not stable and remains on department (awaiting MD orders)  Assess: SIRS CRITERIA  SIRS Temperature  1  SIRS Pulse 1  SIRS Respirations  0  SIRS WBC 0  SIRS Score Sum  2  Inserted for Londell Moh RN

## 2020-06-29 NOTE — Progress Notes (Addendum)
Progress Note    [place] [employer].  QQV:[id number] DOB: 1960/04/21  DOA: 06/04/2020 PCP: System, Provider Not In      Brief Narrative:    Medical records reviewed and are as summarized below:  Delrae Dayven Orf Brooke Bonito. is a 60 y.o. male with medical history significant for hypertension, PTSD, diabetes, migraine, GERD, neurogenic bladder secondary to remote history of transverse myelitis requiring self urethral catheterization, frequent UTIs, chronic back pain on opioids, generally independent at baseline.  He usually gets his care at the [facility] hospital.  He was brought to the emergency room because of altered mental status, cough, shortness of breath, fever, vomiting and diarrhea.  He was admitted to the hospital for sepsis secondary to pneumonia.  He was transferred to the ICU for septic shock, AKI, COPD exacerbation.  He was intubated and placed on mechanical ventilation.  5/21 admitted for sepsis and pneumonia. 6/2 transferred to ICU, intubated underwent bronchoscopy 6/3 self extubated followed by reintubated 6/7 HD started 6/15 extubated 6/19 transferred to [facility]. Neurology, nephrology, urology and infectious disease consulted.   Assessment/Plan:   Principal Problem:   CAP (community acquired pneumonia) Active Problems:   Acute metabolic encephalopathy   Severe sepsis with septic shock (HCC)   Neurogenic bladder   Chronic, continuous use of opioids   Chronic low back pain   Type 2 diabetes mellitus without complication (HCC)   Migraines   Recurrent UTI   History of colon polyps   Urinary retention   Altered mental status   Pressure injury of skin   AKI (acute kidney injury) ([place])   Nutrition Problem: Inadequate oral intake Etiology: inability to eat  Signs/Symptoms: NPO status   Body mass index is 30.26 kg/m.  (Obesity)    Septic shock secondary to community-acquired pneumonia: He completed 8 days of azithromycin, 3 days of cephalosporin plus 5 days of  IV meropenem followed by 7 days of Levaquin and doxycycline.  S/p bronchoscopy blood cultures were negative.  No growth on blood cultures as well.  New fever and tachycardia on 06/28/2020, new sepsis secondary to bilateral pneumonia: Lactic acidosis normal-procalcitonin slightly elevated at 1.28.  Urinalysis showed pyuria and bacteria but he has a Foley catheter in place.  Follow-up urine culture and blood cultures.   MRSA screen has been ordered.  IV vancomycin will be discontinued if MRSA screen is negative.  Continue empiric IV antibiotics for now.    Acute hypoxic respiratory failure: Resolved.  S/p intubation and extubation.  He is tolerating room air.  AKI, hyperphosphatemia: Creatinine is getting worse.  Hemodialysis was initiated on 06/10/2020.  Dialysis catheter has been removed and there is no plan for further hemodialysis.  He is not a good candidate for long-term dialysis.  Case was discussed with nephrologist, Dr. Juleen China.  Acute metabolic encephalopathy, agitation, history of PTSD: Patient has been evaluated by neurologist.  EEG was unremarkable.  Continue psychotropics and prazosin  Dysphagia: Continue enteral nutrition via NG tube.  Family has not made a decision about PEG tube.  He understand that there is increased risk of complications if NG tube stays in for too long.  Hypertension: Continue antihypertensives  Stage II coccygeal decubitus ulcer: Present on admission: Continue foam dressing.  Hypernatremia and hyperkalemia: Improved  Other comorbidities include neurogenic bladder with history of transverse myelitis, chronic pain syndrome on opioids    Diet Order             Diet NPO time specified  Diet effective now  Consultants: Intensivist Urologist Infectious disease Urologist Nephrologist  Procedures: Intubation and bronchoscopy on on 06/06/2020 Central venous catheter insertion on 06/10/2020    Medications:    amLODipine  5  mg Per Tube Daily   chlorhexidine gluconate (MEDLINE KIT)  15 mL Mouth Rinse BID   Chlorhexidine Gluconate Cloth  6 each Topical Daily   cloNIDine  0.3 mg Transdermal Weekly   feeding supplement (PROSource TF)  45 mL Per Tube Daily   free water  100 mL Per Tube Q4H   gabapentin  100 mg Per Tube Q8H   heparin injection (subcutaneous)  5,000 Units Subcutaneous Q8H   insulin aspart  0-20 Units Subcutaneous Q4H   insulin aspart  3 Units Subcutaneous Q4H   insulin glargine  20 Units Subcutaneous Daily   metoprolol tartrate  25 mg Per Tube BID   multivitamin  1 tablet Per Tube QHS   nutrition supplement (JUVEN)  1 packet Per Tube BID   pantoprazole (PROTONIX) IV  40 mg Intravenous Q24H   prazosin  4 mg Per Tube QHS   QUEtiapine  12.5 mg Per Tube BID   sodium chloride flush  10-40 mL Intracatheter Q12H   traZODone  150 mg Per Tube QHS   vancomycin variable dose per unstable renal function (pharmacist dosing)   Does not apply See admin instructions   Continuous Infusions:  sodium chloride Stopped (06/22/20 1614)   feeding supplement (NEPRO CARB STEADY) 55 mL/hr at 06/29/20 0624   fluconazole (DIFLUCAN) IV 50 mg (06/28/20 2100)   meropenem (MERREM) IV 1 g (06/29/20 1021)     Anti-infectives (From admission, onward)    Start     Dose/Rate Route Frequency Ordered Stop   06/28/20 1815  vancomycin (VANCOREADY) IVPB 1750 mg/350 mL        1,750 mg 175 mL/hr over 120 Minutes Intravenous  Once 06/28/20 1726 06/28/20 2054   06/28/20 1815  meropenem (MERREM) 1 g in sodium chloride 0.9 % 100 mL IVPB        1 g 200 mL/hr over 30 Minutes Intravenous Every 12 hours 06/28/20 1726     06/28/20 1742  vancomycin variable dose per unstable renal function (pharmacist dosing)         Does not apply See admin instructions 06/28/20 1742     06/26/20 2100  fluconazole (DIFLUCAN) IVPB 50 mg       See Hyperspace for full Linked Orders Report.   50 mg 25 mL/hr over 60 Minutes Intravenous Every 24 hours  06/25/20 1835 07/01/20 2359   06/25/20 2000  fluconazole (DIFLUCAN) IVPB 200 mg       See Hyperspace for full Linked Orders Report.   200 mg 100 mL/hr over 60 Minutes Intravenous  Once 06/25/20 1835 06/25/20 2348   06/14/20 1800  levofloxacin (LEVAQUIN) IVPB 500 mg        500 mg 100 mL/hr over 60 Minutes Intravenous Every 48 hours 06/12/20 1624 06/19/20 0210   06/12/20 1800  levofloxacin (LEVAQUIN) IVPB 750 mg        750 mg 100 mL/hr over 90 Minutes Intravenous  Once 06/11/20 2239 06/12/20 1904   06/11/20 1800  meropenem (MERREM) 500 mg in sodium chloride 0.9 % 100 mL IVPB  Status:  Discontinued        500 mg 200 mL/hr over 30 Minutes Intravenous Every 24 hours 06/10/20 1444 06/11/20 2231   06/10/20 1300  doxycycline (VIBRAMYCIN) 100 mg in sodium chloride 0.9 % 250 mL IVPB  Status:  Discontinued        100 mg 125 mL/hr over 120 Minutes Intravenous Every 12 hours 06/10/20 1149 06/17/20 1704   06/09/20 1015  meropenem (MERREM) 500 mg in sodium chloride 0.9 % 100 mL IVPB        500 mg 200 mL/hr over 30 Minutes Intravenous Every 12 hours 06/09/20 0922 06/11/20 1203   06/08/20 2200  meropenem (MERREM) 1 g in sodium chloride 0.9 % 100 mL IVPB  Status:  Discontinued        1 g 200 mL/hr over 30 Minutes Intravenous Every 12 hours 06/08/20 0845 06/09/20 0922   06/07/20 2200  azithromycin (ZITHROMAX) 500 mg in sodium chloride 0.9 % 250 mL IVPB        500 mg 250 mL/hr over 60 Minutes Intravenous Every 24 hours 06/07/20 1441 06/11/20 2211   06/07/20 1600  ceFEPIme (MAXIPIME) 2 g in sodium chloride 0.9 % 100 mL IVPB  Status:  Discontinued        2 g 200 mL/hr over 30 Minutes Intravenous Every 8 hours 06/07/20 1441 06/07/20 1442   06/07/20 1600  meropenem (MERREM) 1 g in sodium chloride 0.9 % 100 mL IVPB  Status:  Discontinued        1 g 200 mL/hr over 30 Minutes Intravenous Every 8 hours 06/07/20 1442 06/08/20 0845   06/06/20 1630  vancomycin (VANCOREADY) IVPB 1500 mg/300 mL  Status:  Discontinued         1,500 mg 150 mL/hr over 120 Minutes Intravenous  Once 06/06/20 1533 06/06/20 1632   06/05/20 0600  cefTRIAXone (ROCEPHIN) 2 g in sodium chloride 0.9 % 100 mL IVPB  Status:  Discontinued        2 g 200 mL/hr over 30 Minutes Intravenous Every 24 hours 06/04/20 2029 06/07/20 1439   06/04/20 2200  azithromycin (ZITHROMAX) 500 mg in sodium chloride 0.9 % 250 mL IVPB        500 mg 250 mL/hr over 60 Minutes Intravenous Every 24 hours 06/04/20 2029 06/07/20 0003   06/04/20 1915  ceFEPIme (MAXIPIME) 2 g in sodium chloride 0.9 % 100 mL IVPB        2 g 200 mL/hr over 30 Minutes Intravenous  Once 06/04/20 1906 06/04/20 2055   06/04/20 1915  metroNIDAZOLE (FLAGYL) IVPB 500 mg        500 mg 100 mL/hr over 60 Minutes Intravenous  Once 06/04/20 1906 06/04/20 2055   06/04/20 1915  vancomycin (VANCOCIN) IVPB 1000 mg/200 mL premix  Status:  Discontinued        1,000 mg 200 mL/hr over 60 Minutes Intravenous  Once 06/04/20 1906 06/04/20 1912   06/04/20 1915  vancomycin (VANCOREADY) IVPB 1750 mg/350 mL        1,750 mg 175 mL/hr over 120 Minutes Intravenous  Once 06/04/20 1912 06/04/20 2257              Family Communication/Anticipated D/C date and plan/Code Status   DVT prophylaxis: heparin injection 5,000 Units Start: 06/10/20 2200     Code Status: DNR  Family Communication: None Disposition Plan:    Status is: Inpatient  Remains inpatient appropriate because:Inpatient level of care appropriate due to severity of illness  Dispo: The patient is from: Home              Anticipated d/c is to: SNF              Patient currently is not medically stable to d/c.   Difficult  to place patient No           Subjective:   Interval events noted.  No fever overnight.  He is confused and unable to provide any history.   Objective:    Vitals:   06/29/20 0436 06/29/20 0500 06/29/20 0817 06/29/20 1230  BP: (!) 153/62  (!) 143/68 (!) 161/78  Pulse: 93  93 92  Resp: 18  16 16    Temp: 98.1 F (36.7 C)  98.2 F (36.8 C) 98.1 F (36.7 C)  TempSrc: Axillary     SpO2: 96%  93% 94%  Weight:  85 kg    Height:       No data found.   Intake/Output Summary (Last 24 hours) at 06/29/2020 1440 Last data filed at 06/29/2020 1005 Gross per 24 hour  Intake 548.32 ml  Output 700 ml  Net -151.68 ml   Filed Weights   06/27/20 0441 06/28/20 0649 06/29/20 0500  Weight: 85.5 kg 82.6 kg 85 kg    Exam:  GEN: NAD SKIN: Warm and dry EYES: No pallor or icterus ENT: MMM, NG tube in place CV: RRR PULM: CTA B ABD: soft, ND, NT, +BS CNS: Alert but confused, speech is slurred and incomprehensible, non focal EXT: No edema or tenderness GU: Foley catheter draining amber urine      Data Reviewed:   I have personally reviewed following labs and imaging studies:  Labs: Labs show the following:   Basic Metabolic Panel: Recent Labs  Lab 06/23/20 0454 06/24/20 0504 06/25/20 0558 06/26/20 1040 06/27/20 0630 06/28/20 0624 06/29/20 0500  NA 146* 146*  --  143 144  --  145  K 3.5 3.8  --  4.2 4.1  --  4.0  CL 109 110  --  104 105  --  109  CO2 27 28  --  28 28  --  23  GLUCOSE 175* 251*  --  167* 195*  --  153*  BUN 126* 130*  --  124* 100*  --  164*  CREATININE 4.52* 4.94*  --  5.04* 5.41*  --  6.09*  CALCIUM 9.3 9.7  --  10.2 10.5*  --  10.0  MG 2.5* 2.5* 2.4 2.5* 2.8* 2.9* 2.9*  PHOS 8.8* 8.0* 6.6* 7.9* 8.6* 8.5* 7.7*   GFR Estimated Creatinine Clearance: 13 mL/min (A) (by C-G formula based on SCr of 6.09 mg/dL (H)). Liver Function Tests: Recent Labs  Lab 06/24/20 0504 06/26/20 1040  AST 46* 32  ALT 72* 68*  ALKPHOS 79 82  BILITOT 0.7 0.6  PROT 6.3* 6.4*  ALBUMIN 2.9* 2.8*   No results for input(s): LIPASE, AMYLASE in the last 168 hours.  No results for input(s): AMMONIA in the last 168 hours. Coagulation profile No results for input(s): INR, PROTIME in the last 168 hours.  CBC: Recent Labs  Lab 06/25/20 0558 06/26/20 1040 06/27/20 0630  06/28/20 0624 06/29/20 0500  WBC 7.5 8.2 7.0 6.3 10.1  NEUTROABS 5.2 5.9 5.0 4.8 8.2*  HGB 8.5* 8.8* 9.0* 8.9* 8.3*  HCT 25.2* 27.0* 27.1* 27.2* 25.8*  MCV 90.3 90.9 90.9 91.0 92.1  PLT 206 225 230 236 257   Cardiac Enzymes: No results for input(s): CKTOTAL, CKMB, CKMBINDEX, TROPONINI in the last 168 hours. BNP (last 3 results) No results for input(s): PROBNP in the last 8760 hours. CBG: Recent Labs  Lab 06/28/20 2040 06/29/20 0004 06/29/20 0355 06/29/20 0816 06/29/20 1229  GLUCAP 102* 139* 208* 108* 180*   D-Dimer: No  results for input(s): DDIMER in the last 72 hours. Hgb A1c: No results for input(s): HGBA1C in the last 72 hours. Lipid Profile: No results for input(s): CHOL, HDL, LDLCALC, TRIG, CHOLHDL, LDLDIRECT in the last 72 hours. Thyroid function studies: No results for input(s): TSH, T4TOTAL, T3FREE, THYROIDAB in the last 72 hours.  Invalid input(s): FREET3 Anemia work up: No results for input(s): VITAMINB12, FOLATE, FERRITIN, TIBC, IRON, RETICCTPCT in the last 72 hours. Sepsis Labs: Recent Labs  Lab 06/26/20 1040 06/27/20 0630 06/28/20 0624 06/28/20 1705 06/28/20 2130 06/29/20 0500  PROCALCITON  --   --   --  0.34  --  1.28  WBC 8.2 7.0 6.3  --   --  10.1  LATICACIDVEN  --   --   --  1.6 0.4*  --     Microbiology Recent Results (from the past 240 hour(s))  CULTURE, BLOOD (ROUTINE X 2) w Reflex to ID Panel     Status: None (Preliminary result)   Collection Time: 06/28/20  5:05 PM   Specimen: BLOOD  Result Value Ref Range Status   Specimen Description BLOOD RIGHT HAND  Final   Special Requests   Final    BOTTLES DRAWN AEROBIC AND ANAEROBIC Blood Culture results may not be optimal due to an excessive volume of blood received in culture bottles   Culture   Final    NO GROWTH < 24 HOURS Performed at [facility], [address], [place], [place] [address]    Report Status PENDING  Incomplete  CULTURE, BLOOD (ROUTINE X 2) w Reflex to ID  Panel     Status: None (Preliminary result)   Collection Time: 06/28/20  5:05 PM   Specimen: BLOOD  Result Value Ref Range Status   Specimen Description BLOOD LEFT ANTECUBITAL  Final   Special Requests   Final    BOTTLES DRAWN AEROBIC AND ANAEROBIC Blood Culture adequate volume   Culture   Final    NO GROWTH < 24 HOURS Performed at [facility], [address], [place], [place] [address]    Report Status PENDING  Incomplete     Procedures and diagnostic studies:  DG Chest 1 View  Addendum Date: 06/28/2020   ADDENDUM REPORT: 06/28/2020 20:08 ADDENDUM: The proximal portion of the NG tube is likely coiled in the hypopharynx or proximal esophagus. Tip remains at the GE junction. These results were called by telephone at the time of interpretation on 06/28/2020 at 8:08 pm to provider Dr. Sharlene Motts, Who verbally acknowledged these results. Electronically Signed   By: Zetta Bills M.D.   On: 06/28/2020 20:08   Result Date: 06/28/2020 CLINICAL DATA:  Nasogastric tube placement. EXAM: CHEST  1 VIEW COMPARISON:  June 28, 2020. FINDINGS: RIGHT-sided PICC line in stable position. Nasogastric tube in similar position tip at the EG junction. A redundant loop of the nasogastric tube is likely in the hypopharynx. Cardiomediastinal contours and hilar structures are stable. Mild increased interstitial markings with diffuse patchy opacities and potential cystic change in the LEFT chest unchanged. On limited assessment no acute skeletal process. IMPRESSION: 1. Nasogastric tube tip in the distal esophagus, tube likely coiled in the hypopharynx. 2. Stable bilateral interstitial and airspace disease. A call is out to the referring provider to further discuss findings in the above case. Electronically Signed: By: Zetta Bills M.D. On: 06/28/2020 19:46   DG Chest 1 View  Addendum Date: 06/28/2020   ADDENDUM REPORT: 06/28/2020 19:03 ADDENDUM: Correction to emergent results call time. Correct  time should be  06/28/2020 at 7 o'clock p.m. Electronically Signed   By: Donavan Foil M.D.   On: 06/28/2020 19:03   Result Date: 06/28/2020 CLINICAL DATA:  NG tube placement EXAM: CHEST  1 VIEW COMPARISON:  06/28/2020 FINDINGS: Right upper extremity central venous catheter tip over the SVC. Esophageal tube tip overlies the distal esophagus/GE junction region. Cardiomegaly. Similar patchy bilateral airspace opacities. IMPRESSION: 1. Esophageal tube tip overlies the distal esophagus/GE junction region, consider further advancement for more optimal positioning. 2. Similar cardiomegaly and patchy bilateral airspace disease Critical Value/emergent results were called by telephone at the time of interpretation on 06/28/2020 at 6:28 pm to provider Jennye Boroughs , who verbally acknowledged these results. Electronically Signed: By: Donavan Foil M.D. On: 06/28/2020 18:28   DG Chest Port 1 View  Result Date: 06/28/2020 CLINICAL DATA:  NG tube placement EXAM: PORTABLE CHEST 1 VIEW COMPARISON:  06/28/2020 FINDINGS: Right upper extremity central venous catheter tip over the SVC. Interval advancement of esophageal tube, tip and side port both below the diaphragm but incompletely visualized. No significant change in bilateral airspace opacities IMPRESSION: Interval advancement of esophageal tube, tip and side port are below the diaphragm but the tip is incompletely imaged. Electronically Signed   By: Donavan Foil M.D.   On: 06/28/2020 21:36   DG Chest Port 1 View  Addendum Date: 06/28/2020   ADDENDUM REPORT: 06/28/2020 17:09 ADDENDUM: In the last addendum it should state "nasogastric tube which should be removed and then replaced." Remaining verbiage in the addendum is correct. Electronically Signed   By: Zetta Bills M.D.   On: 06/28/2020 17:09   Addendum Date: 06/28/2020   ADDENDUM REPORT: 06/28/2020 17:05 ADDENDUM: In addition to the nasogastric tube which should be removed and then repositioned there is a cystic area in the  LEFT chest potentially on the current study this could represent an area of post infectious pneumatocele at the site of prior pneumonia. Attention on follow-up was suggested. These results were called by telephone at the time of interpretation on 06/28/2020 at 5:04 pm to provider Jennye Boroughs , who verbally acknowledged these results. Electronically Signed   By: Zetta Bills M.D.   On: 06/28/2020 17:05   Result Date: 06/28/2020 CLINICAL DATA:  Unconscious, fever. EXAM: PORTABLE CHEST 1 VIEW COMPARISON:  Chest x-ray from June 18, 2020. FINDINGS: Heart size and mediastinal contours are stable compared to prior imaging. Diffuse patchy mid chest opacities bilaterally with no sign of lobar consolidation or visible pleural effusion. A gastric tube projects over the mediastinum. The tip is just below the level of the carina but projects over the LEFT mainstem bronchus, position uncertain. RIGHT-sided PICC line tip at the level of the mid superior vena cava. EKG leads project over the chest. On limited assessment no acute skeletal process. IMPRESSION: 1. Diffuse patchy mid chest opacities bilaterally. Suspicious for bilateral pneumonia. 2. A gastric tube projects over the mediastinum. The tip is just below the level of the carina but projects over the LEFT mainstem bronchus, while potentially in the esophagus the position uncertain. Consider withdrawing tube with repositioning and follow-up radiograph. 3. RIGHT-sided PICC line tip at the level of the mid superior vena cava. Call is out to the referring provider to further discuss findings in the above case. Electronically Signed: By: Zetta Bills M.D. On: 06/28/2020 16:53               LOS: 25 days   [facility]  Pager on [email or web address]. If 7PM-7AM, please contact night-coverage at [email or web address]     06/29/2020, 2:40 PM

## 2020-06-29 NOTE — Progress Notes (Signed)
Nutrition Follow-up  DOCUMENTATION CODES:   Obesity unspecified  INTERVENTION:   Continue Nepro 1.8 @55ml /hr + ProSource TF 14m daily via NG tube   Free water flushes 1053mq 4 hours  Regimen provides 2416kcal/day, 118g/day protein and 155978may free water   Rena-vit daily via tube   Juven Fruit Punch BID via tube, each serving provides 95kcal and 2.5g of protein (amino acids glutamine and arginine)  Patient will need G-tube placed if full scope of treatment is to be continued   NUTRITION DIAGNOSIS:   Inadequate oral intake related to inability to eat as evidenced by NPO status -- ongoing  GOAL:   Patient will meet greater than or equal to 90% of their needs -- met with TF  MONITOR:   Labs, Weight trends, Diet advancement, Skin, I & O's  ASSESSMENT:   [age]o. male with h/o hypertension, PTSD, diabetes, migraines, GERD, neurogenic bladder secondary to remote history of transverse myelitis, requires self-catheterization and has frequent UTIs and opioids for chronic pain who is admitted on 06/04/2020 for CAP and sepsis   6/6 HD initiation 6/17 NGT replaced 6/24 NGT replaced again  Pt with new fever and tachycardia as of yesterday, new sepsis 2/2 bilateral PNA. Note continued worsening renal function, but pt is not a candidate for long-term HD per Nephrology. Dialysis catheter was removed. Palliative following. [place].   Pt continues to tolerate TF via NGT. Current regimen: Nepro @ 20m68m 45ml21msource TF daily and 100ml 19m water Q4H. Family has yet to make a decision regarding PEG.   UOP: 1.2Lx24 hours  Medications: SSI, lantus, renavit, juven BID, protonix  Labs: PO4 7.7(H), Mg 2.9(H), Cr 6.09 (H) CBGs 108-180-108  Diet Order:    Diet Order             Diet NPO time specified  Diet effective now                  EDUCATION NEEDS:   No education needs have been identified at this time  Skin:  Skin Assessment: Reviewed RN  Assessment (sacral wound 1.8 cm x 0.8 cm)  Last BM:  6/25 type 6,7  Height:   Ht Readings from Last 1 Encounters:  06/18/20 5' 5.98" (1.676 m)    Weight:   Wt Readings from Last 1 Encounters:  06/29/20 85 kg    Ideal Body Weight:  64.5 kg  BMI:  Body mass index is 30.26 kg/m.  Estimated Nutritional Needs:   Kcal:  2100-2400kcal/day  Protein:  105-120g/day  Fluid:  1.9-2.2L/day    AmandaLarkin InaRD, LDN (she/her/hers) RD pager number and weekend/on-call pager number located [email or web address]

## 2020-06-29 NOTE — Consult Note (Signed)
Pharmacy Antibiotic Note  Bauer Ausborn. is a 60 y.o. male admitted on 06/04/2020 with sepsis.  Patient with blood cultures on 5/31 revealing MSSE bacteremia and treated with Ceftriaxone and Azithromycin initially, then eventually switched to levaquin and doxycycline( for potential TBI) for a total of 14 days of therapy. However,  recent chest x-ray shows bilateral pneumonia. Repeat blood cultures, urinalysis, lactic acid and procalcitonin have been ordered. Pharmacy has been consulted for Vancomycin and Meropenem dosing.  Plan: 1) Vancomycin 1750mg  IV x 1 as loading dose. Due to patient's AKI and and poor candidacy for long term dialysis with no further dialysis sessions planned, will dose off of random Vancomycin levels currently.  --Vr ordered for 06/29/20@2000 .  6/25:  Vanc random @ 2235 = 21 mcg/mL Will repeat vanc level in 12 hrs on 6/26 @ 1000.   2) Meropenem 1g Q12 hours    Height: 5' 5.98" (167.6 cm) Weight: 85 kg (187 lb 6.3 oz) IBW/kg (Calculated) : 63.76  Temp (24hrs), Avg:98.2 F (36.8 C), Min:98.1 F (36.7 C), Max:98.5 F (36.9 C)  Recent Labs  Lab 06/23/20 0454 06/24/20 0504 06/25/20 0558 06/26/20 1040 06/27/20 0630 06/28/20 0624 06/28/20 1705 06/28/20 2130 06/29/20 0500 06/29/20 2235  WBC 8.1 7.1 7.5 8.2 7.0 6.3  --   --  10.1  --   CREATININE 4.52* 4.94*  --  5.04* 5.41*  --   --   --  6.09*  --   LATICACIDVEN  --   --   --   --   --   --  1.6 0.4*  --   --   VANCORANDOM  --   --   --   --   --   --   --   --   --  21     Estimated Creatinine Clearance: 13 mL/min (A) (by C-G formula based on SCr of 6.09 mg/dL (H)).    No Known Allergies  Antimicrobials this admission: Vanc/Mero 6/24 >>    Microbiology results: 6/24 BCx: pending 5/31 Bcx: MSSE 6/2 UCx: NG    Thank you for allowing pharmacy to be a part of this patient's care.  Fernandez Kenley D 06/29/2020 11:34 PM

## 2020-06-30 ENCOUNTER — Inpatient Hospital Stay: Payer: No Typology Code available for payment source

## 2020-06-30 DIAGNOSIS — N179 Acute kidney failure, unspecified: Secondary | ICD-10-CM | POA: Diagnosis not present

## 2020-06-30 DIAGNOSIS — J189 Pneumonia, unspecified organism: Secondary | ICD-10-CM | POA: Diagnosis not present

## 2020-06-30 LAB — GLUCOSE, CAPILLARY
Glucose-Capillary: 129 mg/dL — ABNORMAL HIGH (ref 70–99)
Glucose-Capillary: 131 mg/dL — ABNORMAL HIGH (ref 70–99)
Glucose-Capillary: 139 mg/dL — ABNORMAL HIGH (ref 70–99)
Glucose-Capillary: 145 mg/dL — ABNORMAL HIGH (ref 70–99)
Glucose-Capillary: 146 mg/dL — ABNORMAL HIGH (ref 70–99)
Glucose-Capillary: 95 mg/dL (ref 70–99)

## 2020-06-30 LAB — CBC WITH DIFFERENTIAL/PLATELET
Abs Immature Granulocytes: 0.04 10*3/uL (ref 0.00–0.07)
Basophils Absolute: 0 10*3/uL (ref 0.0–0.1)
Basophils Relative: 0 %
Eosinophils Absolute: 0.3 10*3/uL (ref 0.0–0.5)
Eosinophils Relative: 5 %
HCT: 25.7 % — ABNORMAL LOW (ref 39.0–52.0)
Hemoglobin: 8.3 g/dL — ABNORMAL LOW (ref 13.0–17.0)
Immature Granulocytes: 1 %
Lymphocytes Relative: 13 %
Lymphs Abs: 0.9 10*3/uL (ref 0.7–4.0)
MCH: 29.7 pg (ref 26.0–34.0)
MCHC: 32.3 g/dL (ref 30.0–36.0)
MCV: 92.1 fL (ref 80.0–100.0)
Monocytes Absolute: 0.6 10*3/uL (ref 0.1–1.0)
Monocytes Relative: 9 %
Neutro Abs: 5 10*3/uL (ref 1.7–7.7)
Neutrophils Relative %: 72 %
Platelets: 250 10*3/uL (ref 150–400)
RBC: 2.79 MIL/uL — ABNORMAL LOW (ref 4.22–5.81)
RDW: 14.9 % (ref 11.5–15.5)
WBC: 6.8 10*3/uL (ref 4.0–10.5)
nRBC: 0 % (ref 0.0–0.2)

## 2020-06-30 LAB — BASIC METABOLIC PANEL
Anion gap: 12 (ref 5–15)
BUN: 163 mg/dL — ABNORMAL HIGH (ref 8–23)
CO2: 24 mmol/L (ref 22–32)
Calcium: 10.5 mg/dL — ABNORMAL HIGH (ref 8.9–10.3)
Chloride: 109 mmol/L (ref 98–111)
Creatinine, Ser: 5.54 mg/dL — ABNORMAL HIGH (ref 0.61–1.24)
GFR, Estimated: 11 mL/min — ABNORMAL LOW (ref >60)
Glucose, Bld: 182 mg/dL — ABNORMAL HIGH (ref 70–99)
Potassium: 3.8 mmol/L (ref 3.5–5.1)
Sodium: 145 mmol/L (ref 135–145)

## 2020-06-30 LAB — PHOSPHORUS: Phosphorus: 7.8 mg/dL — ABNORMAL HIGH (ref 2.5–4.6)

## 2020-06-30 LAB — MAGNESIUM: Magnesium: 3 mg/dL — ABNORMAL HIGH (ref 1.7–2.4)

## 2020-06-30 LAB — PROCALCITONIN: Procalcitonin: 1.04 ng/mL

## 2020-06-30 LAB — VANCOMYCIN, RANDOM: Vancomycin Rm: 19

## 2020-06-30 IMAGING — DX DG CHEST 1V PORT
1 series · 1 of 1 positions shown · non-contrast
Comparison: [DATE]

CLINICAL DATA: NG tube check

EXAM:
PORTABLE CHEST 1 VIEW

[chest ap]
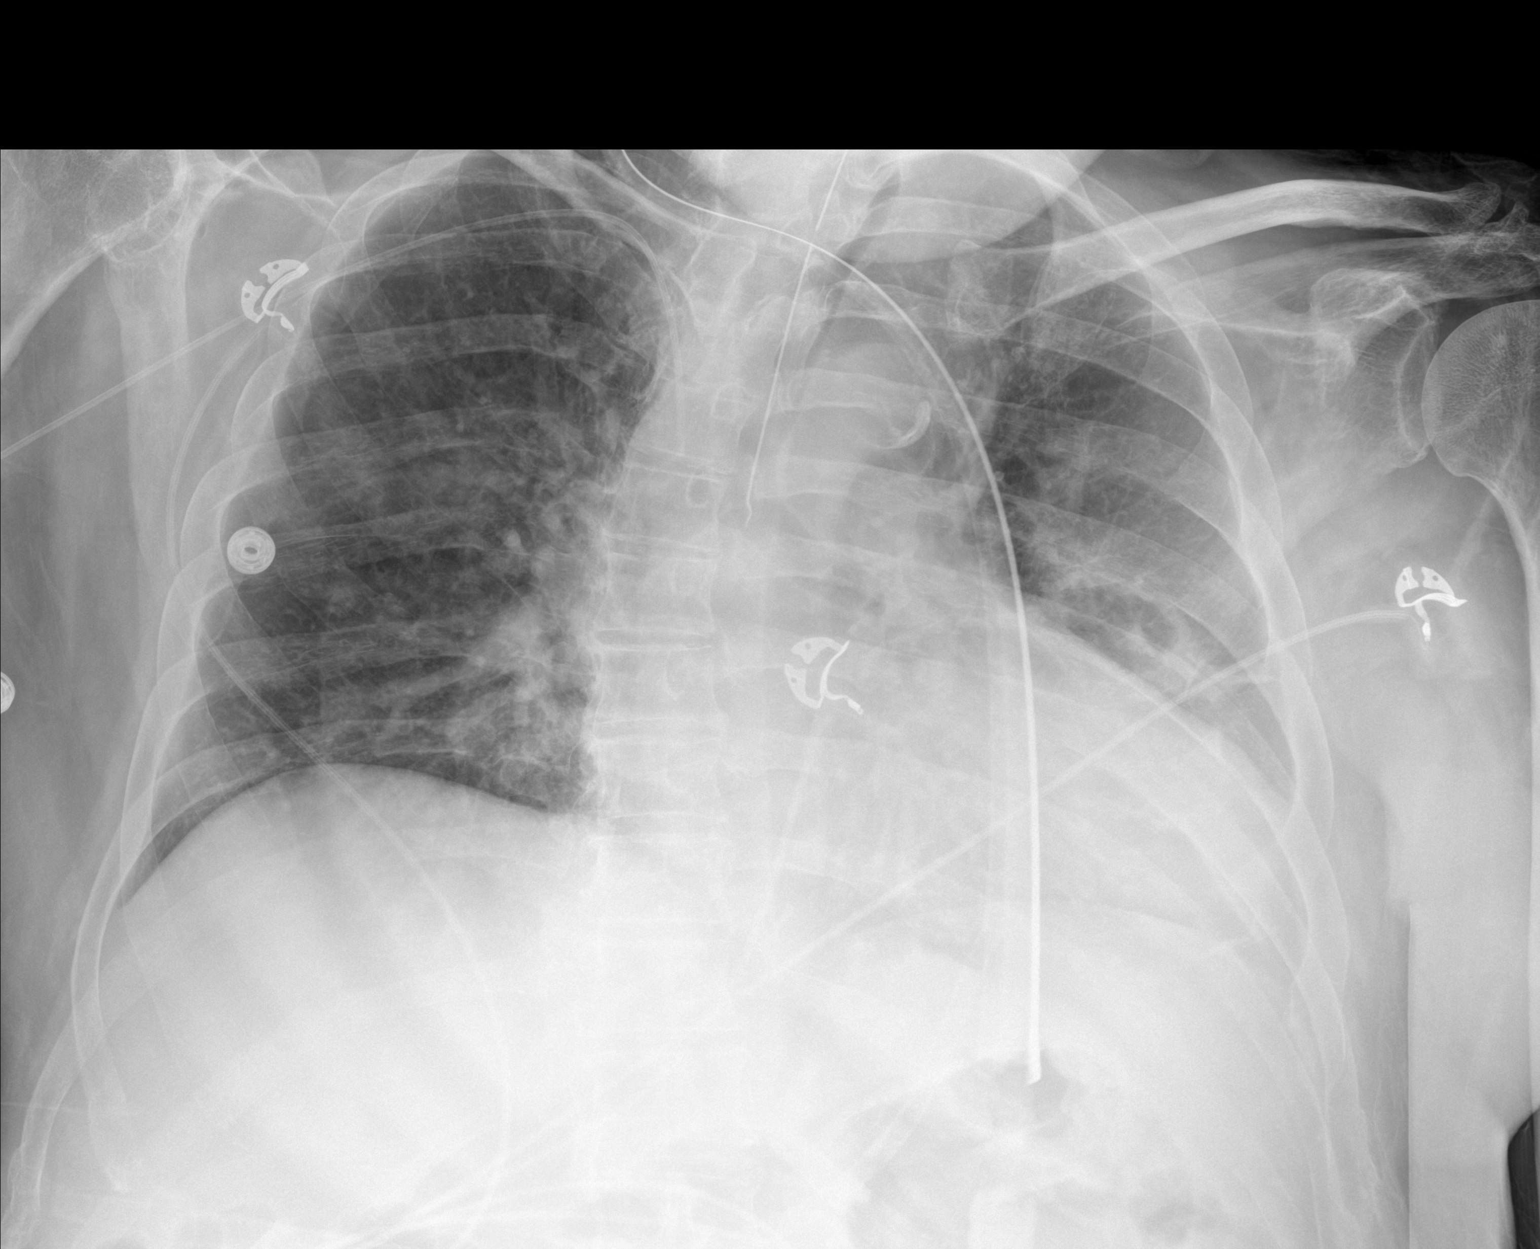

[1 of 1 positions shown; findings below may reference images not displayed]

FINDINGS: NG tube tip is in the midthoracic esophagus, unchanged since earlier
study. Patchy airspace disease throughout the left lung. Mild
cardiomegaly.
IMPRESSION: NG tube tip in the midthoracic spine fetus.

Patchy opacities throughout the left lung, unchanged.

## 2020-06-30 IMAGING — DX DG CHEST 1V PORT
1 series · 1 of 1 positions shown · non-contrast
Comparison: Radiograph [DATE]

CLINICAL DATA: NG tube check

EXAM:
PORTABLE CHEST 1 VIEW

[chest ap]
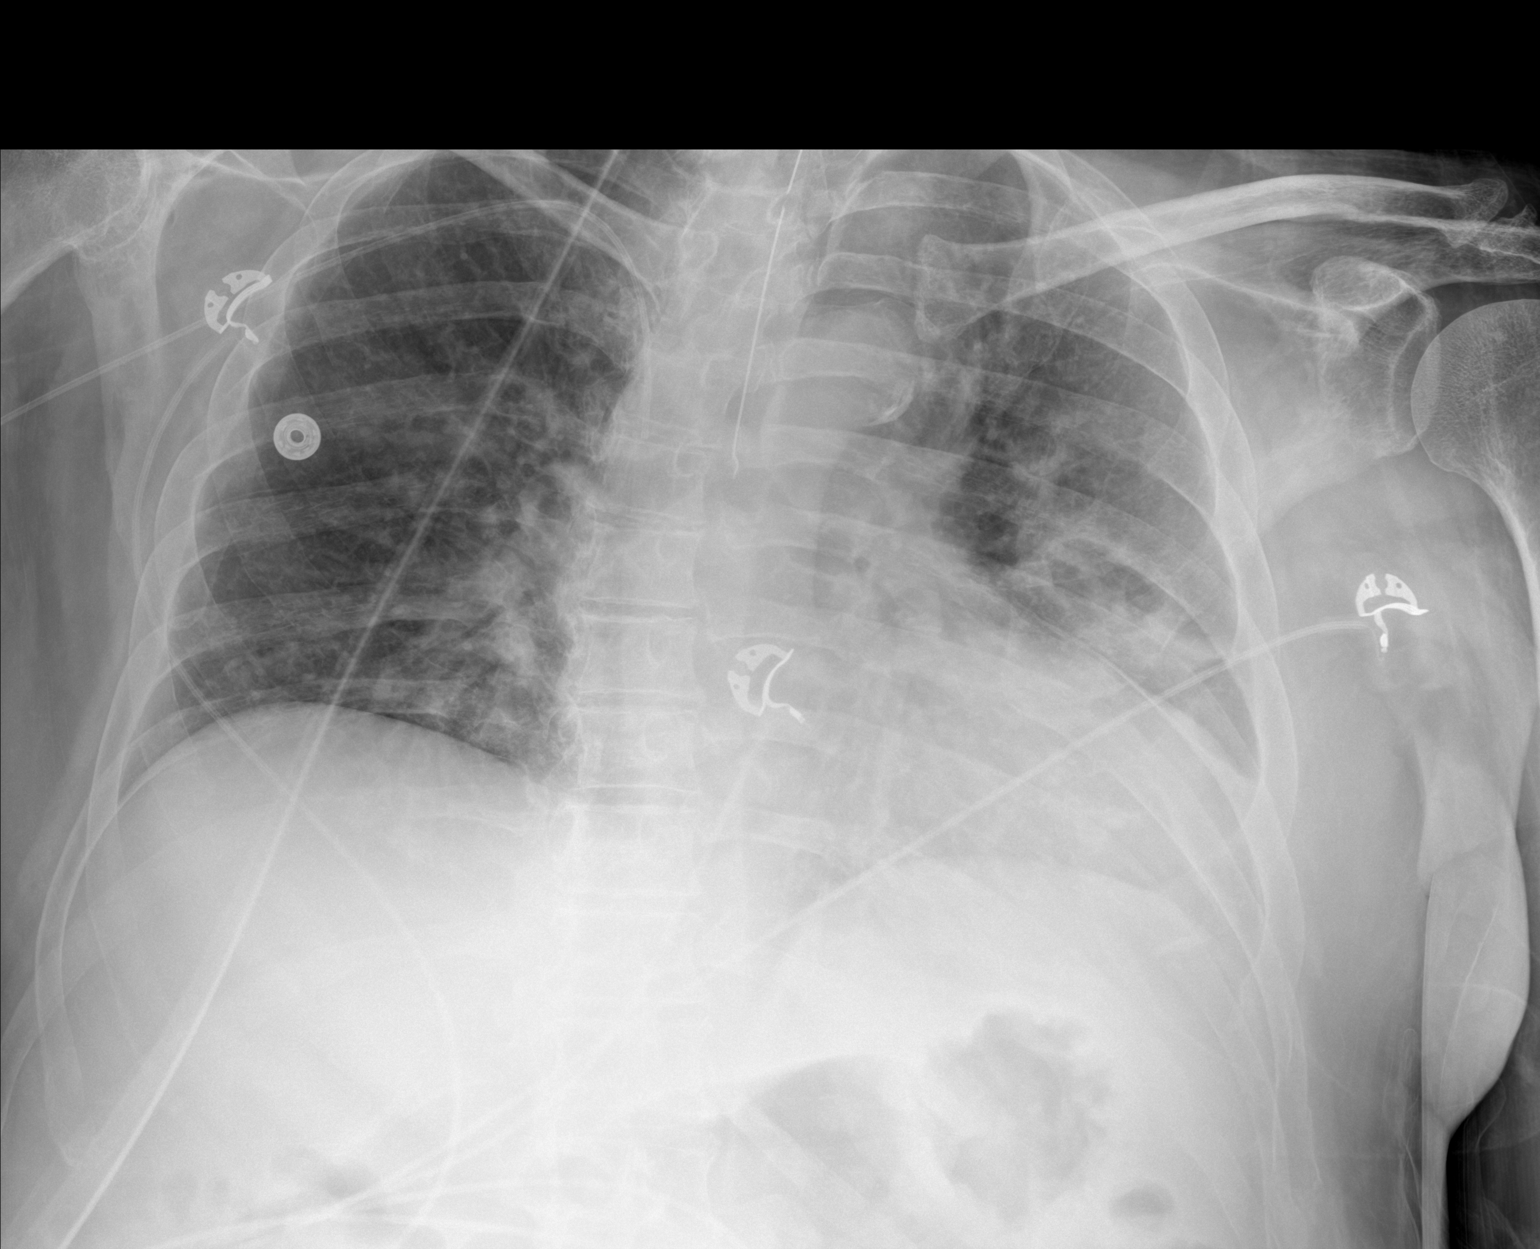

[1 of 1 positions shown; findings below may reference images not displayed]

FINDINGS: Transesophageal tube tip terminates in the midthoracic esophagus
with side port in the proximal esophagus just below the level of the
thoracic inlet. Right upper extremity PICC tip terminates in the mid
SVC. Telemetry leads and support devices overlie the chest.

Stable cardiomediastinal contours with a calcified aorta accounting
for differences in positioning. No significant interval change in
the bilateral heterogeneous airspace opacities, most pronounced in
the left mid lung and retrocardiac space. No pneumothorax. Suspect
some layering left effusion. No visible right effusion.

Degenerative changes are present in the imaged spine and shoulders.
No acute or worrisome osseous abnormalities.
IMPRESSION: Transesophageal tube has been retracted and now terminates in the
midthoracic esophagus and should be advanced nearly 25 cm to
position the side port beyond the GE junction.

Stable positioning of a right upper extremity PICC.

Persistent bilateral heterogeneous airspace opacities without
significant change from prior.

Trace left effusion.

## 2020-06-30 NOTE — Progress Notes (Signed)
[facility]  PROGRESS NOTE   Subjective:   Patient seen at bedside.  He is more awake today and responding to questions. Had a urine output of about 2.4 L yesterday.  Objective:  Vital signs in last 24 hours:  Temp:  [98.1 F (36.7 C)-98.5 F (36.9 C)] 98.5 F (36.9 C) (06/26 0802) Pulse Rate:  [63-102] 63 (06/26 0802) Resp:  [16-20] 18 (06/26 0802) BP: (158-183)/(75-85) 169/85 (06/26 0802) SpO2:  [94 %-98 %] 98 % (06/26 0802) Weight:  [89.4 kg] 89.4 kg (06/26 0500)  Weight change: 4.359 kg Filed Weights   06/28/20 0649 06/29/20 0500 06/30/20 0500  Weight: 82.6 kg 85 kg 89.4 kg    Intake/Output: I/O last 3 completed shifts: In: 948.3 [I.V.:40; NG/GT:783; IV Piggyback:125.3] Out: 2800 [Urine:2450; Emesis/NG output:350]   Intake/Output this shift:  Total I/O In: -  Out: 750 [Urine:750]  Physical Exam: General:  No acute distress  Head:  Normocephalic, atraumatic. Moist oral mucosal membranes  Eyes:  Anicteric  Neck:  Supple  Lungs:   Clear to auscultation, normal effort  Heart:  S1S2 no rubs  Abdomen:   Soft, nontender, bowel sounds present  Extremities:  peripheral edema.  Neurologic:  Awake, alert, following commands  Skin:  No lesions  Access:     Basic Metabolic Panel: Recent Labs  Lab 06/24/20 0504 06/25/20 0558 06/26/20 1040 06/27/20 0630 06/28/20 0624 06/29/20 0500 06/30/20 0431  NA 146*  --  143 144  --  145 145  K 3.8  --  4.2 4.1  --  4.0 3.8  CL 110  --  104 105  --  109 109  CO2 28  --  28 28  --  23 24  GLUCOSE 251*  --  167* 195*  --  153* 182*  BUN 130*  --  124* 100*  --  164* 163*  CREATININE 4.94*  --  5.04* 5.41*  --  6.09* 5.54*  CALCIUM 9.7  --  10.2 10.5*  --  10.0 10.5*  MG 2.5*   < > 2.5* 2.8* 2.9* 2.9* 3.0*  PHOS 8.0*   < > 7.9* 8.6* 8.5* 7.7* 7.8*   < > = values in this interval not displayed.    CBC: Recent Labs  Lab 06/26/20 1040 06/27/20 0630 06/28/20 0624 06/29/20 0500 06/30/20 0431  WBC 8.2 7.0  6.3 10.1 6.8  NEUTROABS 5.9 5.0 4.8 8.2* 5.0  HGB 8.8* 9.0* 8.9* 8.3* 8.3*  HCT 27.0* 27.1* 27.2* 25.8* 25.7*  MCV 90.9 90.9 91.0 92.1 92.1  PLT 225 230 236 257 250     Urinalysis: Recent Labs    06/28/20 1613  COLORURINE YELLOW*  LABSPEC 1.014  PHURINE 5.0  GLUCOSEU 50*  HGBUR MODERATE*  BILIRUBINUR NEGATIVE  KETONESUR NEGATIVE  PROTEINUR 30*  NITRITE NEGATIVE  LEUKOCYTESUR LARGE*      Imaging: DG Chest 1 View  Addendum Date: 06/28/2020   ADDENDUM REPORT: 06/28/2020 20:08 ADDENDUM: The proximal portion of the NG tube is likely coiled in the hypopharynx or proximal esophagus. Tip remains at the GE junction. These results were called by telephone at the time of interpretation on 06/28/2020 at 8:08 pm to provider Dr. Sharlene Motts, Who verbally acknowledged these results. Electronically Signed   By: Zetta Bills M.D.   On: 06/28/2020 20:08   Result Date: 06/28/2020 CLINICAL DATA:  Nasogastric tube placement. EXAM: CHEST  1 VIEW COMPARISON:  June 28, 2020. FINDINGS: RIGHT-sided PICC line in stable position. Nasogastric tube in similar  position tip at the EG junction. A redundant loop of the nasogastric tube is likely in the hypopharynx. Cardiomediastinal contours and hilar structures are stable. Mild increased interstitial markings with diffuse patchy opacities and potential cystic change in the LEFT chest unchanged. On limited assessment no acute skeletal process. IMPRESSION: 1. Nasogastric tube tip in the distal esophagus, tube likely coiled in the hypopharynx. 2. Stable bilateral interstitial and airspace disease. A call is out to the referring provider to further discuss findings in the above case. Electronically Signed: By: Zetta Bills M.D. On: 06/28/2020 19:46   DG Chest 1 View  Addendum Date: 06/28/2020   ADDENDUM REPORT: 06/28/2020 19:03 ADDENDUM: Correction to emergent results call time. Correct time should be 06/28/2020 at 7 o'clock p.m. Electronically Signed   By: Donavan Foil  M.D.   On: 06/28/2020 19:03   Result Date: 06/28/2020 CLINICAL DATA:  NG tube placement EXAM: CHEST  1 VIEW COMPARISON:  06/28/2020 FINDINGS: Right upper extremity central venous catheter tip over the SVC. Esophageal tube tip overlies the distal esophagus/GE junction region. Cardiomegaly. Similar patchy bilateral airspace opacities. IMPRESSION: 1. Esophageal tube tip overlies the distal esophagus/GE junction region, consider further advancement for more optimal positioning. 2. Similar cardiomegaly and patchy bilateral airspace disease Critical Value/emergent results were called by telephone at the time of interpretation on 06/28/2020 at 6:28 pm to provider Jennye Boroughs , who verbally acknowledged these results. Electronically Signed: By: Donavan Foil M.D. On: 06/28/2020 18:28   DG Chest Port 1 View  Result Date: 06/28/2020 CLINICAL DATA:  NG tube placement EXAM: PORTABLE CHEST 1 VIEW COMPARISON:  06/28/2020 FINDINGS: Right upper extremity central venous catheter tip over the SVC. Interval advancement of esophageal tube, tip and side port both below the diaphragm but incompletely visualized. No significant change in bilateral airspace opacities IMPRESSION: Interval advancement of esophageal tube, tip and side port are below the diaphragm but the tip is incompletely imaged. Electronically Signed   By: Donavan Foil M.D.   On: 06/28/2020 21:36   DG Chest Port 1 View  Addendum Date: 06/28/2020   ADDENDUM REPORT: 06/28/2020 17:09 ADDENDUM: In the last addendum it should state "nasogastric tube which should be removed and then replaced." Remaining verbiage in the addendum is correct. Electronically Signed   By: Zetta Bills M.D.   On: 06/28/2020 17:09   Addendum Date: 06/28/2020   ADDENDUM REPORT: 06/28/2020 17:05 ADDENDUM: In addition to the nasogastric tube which should be removed and then repositioned there is a cystic area in the LEFT chest potentially on the current study this could represent an area  of post infectious pneumatocele at the site of prior pneumonia. Attention on follow-up was suggested. These results were called by telephone at the time of interpretation on 06/28/2020 at 5:04 pm to provider Jennye Boroughs , who verbally acknowledged these results. Electronically Signed   By: Zetta Bills M.D.   On: 06/28/2020 17:05   Result Date: 06/28/2020 CLINICAL DATA:  Unconscious, fever. EXAM: PORTABLE CHEST 1 VIEW COMPARISON:  Chest x-ray from June 18, 2020. FINDINGS: Heart size and mediastinal contours are stable compared to prior imaging. Diffuse patchy mid chest opacities bilaterally with no sign of lobar consolidation or visible pleural effusion. A gastric tube projects over the mediastinum. The tip is just below the level of the carina but projects over the LEFT mainstem bronchus, position uncertain. RIGHT-sided PICC line tip at the level of the mid superior vena cava. EKG leads project over the chest. On limited assessment no  acute skeletal process. IMPRESSION: 1. Diffuse patchy mid chest opacities bilaterally. Suspicious for bilateral pneumonia. 2. A gastric tube projects over the mediastinum. The tip is just below the level of the carina but projects over the LEFT mainstem bronchus, while potentially in the esophagus the position uncertain. Consider withdrawing tube with repositioning and follow-up radiograph. 3. RIGHT-sided PICC line tip at the level of the mid superior vena cava. Call is out to the referring provider to further discuss findings in the above case. Electronically Signed: By: Zetta Bills M.D. On: 06/28/2020 16:53     Medications:    sodium chloride Stopped (06/22/20 1614)   feeding supplement (NEPRO CARB STEADY) 1,000 mL (06/29/20 2109)   fluconazole (DIFLUCAN) IV 50 mg (06/29/20 2040)   meropenem (MERREM) IV 1 g (06/30/20 0911)    amLODipine  5 mg Per Tube Daily   chlorhexidine gluconate (MEDLINE KIT)  15 mL Mouth Rinse BID   Chlorhexidine Gluconate Cloth  6 each  Topical Daily   cloNIDine  0.3 mg Transdermal Weekly   feeding supplement (PROSource TF)  45 mL Per Tube Daily   free water  100 mL Per Tube Q4H   gabapentin  100 mg Per Tube Q8H   heparin injection (subcutaneous)  5,000 Units Subcutaneous Q8H   insulin aspart  0-20 Units Subcutaneous Q4H   insulin aspart  3 Units Subcutaneous Q4H   insulin glargine  20 Units Subcutaneous Daily   metoprolol tartrate  25 mg Per Tube BID   multivitamin  1 tablet Per Tube QHS   nutrition supplement (JUVEN)  1 packet Per Tube BID   pantoprazole (PROTONIX) IV  40 mg Intravenous Q24H   prazosin  4 mg Per Tube QHS   QUEtiapine  12.5 mg Per Tube BID   sodium chloride flush  10-40 mL Intracatheter Q12H   traZODone  150 mg Per Tube QHS    Assessment/ Plan:     Principal Problem:   CAP (community acquired pneumonia) Active Problems:   Acute metabolic encephalopathy   Severe sepsis with septic shock (HCC)   Neurogenic bladder   Chronic, continuous use of opioids   Chronic low back pain   Type 2 diabetes mellitus without complication (HCC)   Migraines   Recurrent UTI   History of colon polyps   Urinary retention   Altered mental status   Pressure injury of skin   AKI (acute kidney injury) ([place])  60 year old male with history of diabetes, PTSD, peripheral vascular disease, hypertension, GERD, history of transverse myelitis with neurogenic bladder now with worsening renal indicis. Renal indicis are somewhat improved today.  His urine output was over 2 L the last 24 hours. He is not a candidate for long-term hemodialysis.  The dialysis catheter was removed.  Palliative care note appreciated.   LOS: [place], [facility] 6/26/202211:49 AM

## 2020-06-30 NOTE — Progress Notes (Signed)
Progress Note    Wyatt Ball.  RFX:[id number] DOB: [birth year]  DOA: 06/04/2020 PCP: System, Provider Not In      Brief Narrative:    Medical records reviewed and are as summarized below:  Wyatt Joneric Soja Brooke Bonito. is a 60 y.o. male with medical history significant for hypertension, PTSD, diabetes, migraine, GERD, neurogenic bladder secondary to remote history of transverse myelitis requiring self urethral catheterization, frequent UTIs, chronic back pain on opioids, generally independent at baseline.  He usually gets his care at the [facility] hospital.  He was brought to the emergency room because of altered mental status, cough, shortness of breath, fever, vomiting and diarrhea.  He was admitted to the hospital for sepsis secondary to pneumonia.  He was transferred to the ICU for septic shock, AKI, COPD exacerbation.  He was intubated and placed on mechanical ventilation.  5/21 admitted for sepsis and pneumonia. 6/2 transferred to ICU, intubated underwent bronchoscopy 6/3 self extubated followed by reintubated 6/7 HD started 6/15 extubated 6/19 transferred to [facility]. Neurology, nephrology, urology and infectious disease consulted.   Assessment/Plan:   Principal Problem:   CAP (community acquired pneumonia) Active Problems:   Acute metabolic encephalopathy   Severe sepsis with septic shock (HCC)   Neurogenic bladder   Chronic, continuous use of opioids   Chronic low back pain   Type 2 diabetes mellitus without complication (HCC)   Migraines   Recurrent UTI   History of colon polyps   Urinary retention   Altered mental status   Pressure injury of skin   AKI (acute kidney injury) ([place])   Nutrition Problem: Inadequate oral intake Etiology: inability to eat  Signs/Symptoms: NPO status   Body mass index is 31.81 kg/m.  (Obesity)    Septic shock secondary to community-acquired pneumonia: He completed 8 days of azithromycin, 3 days of cephalosporin plus 5 days of  IV meropenem followed by 7 days of Levaquin and doxycycline.  S/p bronchoscopy blood cultures were negative.  No growth on blood cultures as well.  New fever and tachycardia on 06/28/2020, new sepsis secondary to bilateral pneumonia: MRSA screen was negative.  Discontinue IV vancomycin.  Continue IV meropenem.  Pyuria is probably from Foley catheter.  Follow-up urine and blood cultures.  Acute hypoxic respiratory failure: Resolved.  S/p intubation and extubation.  He is tolerating room air.  AKI, hyperphosphatemia: Creatinine is still elevated but stable.  Hemodialysis was initiated on 06/10/2020.  Dialysis catheter has been removed and there is no plan for further hemodialysis.  He is not a good candidate for long-term dialysis per nephrologist.    Acute metabolic encephalopathy, agitation, history of PTSD: Patient has been evaluated by neurologist.  EEG was unremarkable.  Continue psychotropics and prazosin  Dysphagia: Continue enteral nutrition via NG tube.  Family has not made a decision about PEG tube.  He understand that there is increased risk of complications if NG tube stays in for too long.  Hypertension: Continue antihypertensives  Stage II coccygeal decubitus ulcer: Present on admission: Continue foam dressing.  Hypernatremia and hyperkalemia: Improved  Other comorbidities include neurogenic bladder with history of transverse myelitis, chronic pain syndrome on opioids  I called his brother Frankey Poot on his cell phone but there was no response.  His voice mailbox is full and I could not leave a message.  Diet Order             Diet NPO time specified  Diet effective now  Consultants: Intensivist Urologist Infectious disease Urologist Nephrologist  Procedures: Intubation and bronchoscopy on on 06/06/2020 Central venous catheter insertion on 06/10/2020    Medications:    amLODipine  5 mg Per Tube Daily   chlorhexidine gluconate (MEDLINE KIT)  15  mL Mouth Rinse BID   Chlorhexidine Gluconate Cloth  6 each Topical Daily   cloNIDine  0.3 mg Transdermal Weekly   feeding supplement (PROSource TF)  45 mL Per Tube Daily   free water  100 mL Per Tube Q4H   gabapentin  100 mg Per Tube Q8H   heparin injection (subcutaneous)  5,000 Units Subcutaneous Q8H   insulin aspart  0-20 Units Subcutaneous Q4H   insulin aspart  3 Units Subcutaneous Q4H   insulin glargine  20 Units Subcutaneous Daily   metoprolol tartrate  25 mg Per Tube BID   multivitamin  1 tablet Per Tube QHS   nutrition supplement (JUVEN)  1 packet Per Tube BID   pantoprazole (PROTONIX) IV  40 mg Intravenous Q24H   prazosin  4 mg Per Tube QHS   QUEtiapine  12.5 mg Per Tube BID   sodium chloride flush  10-40 mL Intracatheter Q12H   traZODone  150 mg Per Tube QHS   Continuous Infusions:  sodium chloride Stopped (06/22/20 1614)   feeding supplement (NEPRO CARB STEADY) 1,000 mL (06/29/20 2109)   fluconazole (DIFLUCAN) IV 50 mg (06/29/20 2040)   meropenem (MERREM) IV 1 g (06/30/20 0911)     Anti-infectives (From admission, onward)    Start     Dose/Rate Route Frequency Ordered Stop   06/28/20 1815  vancomycin (VANCOREADY) IVPB 1750 mg/350 mL        1,750 mg 175 mL/hr over 120 Minutes Intravenous  Once 06/28/20 1726 06/28/20 2054   06/28/20 1815  meropenem (MERREM) 1 g in sodium chloride 0.9 % 100 mL IVPB        1 g 200 mL/hr over 30 Minutes Intravenous Every 12 hours 06/28/20 1726     06/28/20 1742  vancomycin variable dose per unstable renal function (pharmacist dosing)  Status:  Discontinued         Does not apply See admin instructions 06/28/20 1742 06/30/20 0757   06/26/20 2100  fluconazole (DIFLUCAN) IVPB 50 mg       See Hyperspace for full Linked Orders Report.   50 mg 25 mL/hr over 60 Minutes Intravenous Every 24 hours 06/25/20 1835 06/30/20 2300   06/25/20 2000  fluconazole (DIFLUCAN) IVPB 200 mg       See Hyperspace for full Linked Orders Report.   200 mg 100  mL/hr over 60 Minutes Intravenous  Once 06/25/20 1835 06/25/20 2348   06/14/20 1800  levofloxacin (LEVAQUIN) IVPB 500 mg        500 mg 100 mL/hr over 60 Minutes Intravenous Every 48 hours 06/12/20 1624 06/19/20 0210   06/12/20 1800  levofloxacin (LEVAQUIN) IVPB 750 mg        750 mg 100 mL/hr over 90 Minutes Intravenous  Once 06/11/20 2239 06/12/20 1904   06/11/20 1800  meropenem (MERREM) 500 mg in sodium chloride 0.9 % 100 mL IVPB  Status:  Discontinued        500 mg 200 mL/hr over 30 Minutes Intravenous Every 24 hours 06/10/20 1444 06/11/20 2231   06/10/20 1300  doxycycline (VIBRAMYCIN) 100 mg in sodium chloride 0.9 % 250 mL IVPB  Status:  Discontinued        100 mg 125 mL/hr over 120 Minutes Intravenous  Every 12 hours 06/10/20 1149 06/17/20 1704   06/09/20 1015  meropenem (MERREM) 500 mg in sodium chloride 0.9 % 100 mL IVPB        500 mg 200 mL/hr over 30 Minutes Intravenous Every 12 hours 06/09/20 0922 06/11/20 1203   06/08/20 2200  meropenem (MERREM) 1 g in sodium chloride 0.9 % 100 mL IVPB  Status:  Discontinued        1 g 200 mL/hr over 30 Minutes Intravenous Every 12 hours 06/08/20 0845 06/09/20 0922   06/07/20 2200  azithromycin (ZITHROMAX) 500 mg in sodium chloride 0.9 % 250 mL IVPB        500 mg 250 mL/hr over 60 Minutes Intravenous Every 24 hours 06/07/20 1441 06/11/20 2211   06/07/20 1600  ceFEPIme (MAXIPIME) 2 g in sodium chloride 0.9 % 100 mL IVPB  Status:  Discontinued        2 g 200 mL/hr over 30 Minutes Intravenous Every 8 hours 06/07/20 1441 06/07/20 1442   06/07/20 1600  meropenem (MERREM) 1 g in sodium chloride 0.9 % 100 mL IVPB  Status:  Discontinued        1 g 200 mL/hr over 30 Minutes Intravenous Every 8 hours 06/07/20 1442 06/08/20 0845   06/06/20 1630  vancomycin (VANCOREADY) IVPB 1500 mg/300 mL  Status:  Discontinued        1,500 mg 150 mL/hr over 120 Minutes Intravenous  Once 06/06/20 1533 06/06/20 1632   06/05/20 0600  cefTRIAXone (ROCEPHIN) 2 g in sodium  chloride 0.9 % 100 mL IVPB  Status:  Discontinued        2 g 200 mL/hr over 30 Minutes Intravenous Every 24 hours 06/04/20 2029 06/07/20 1439   06/04/20 2200  azithromycin (ZITHROMAX) 500 mg in sodium chloride 0.9 % 250 mL IVPB        500 mg 250 mL/hr over 60 Minutes Intravenous Every 24 hours 06/04/20 2029 06/07/20 0003   06/04/20 1915  ceFEPIme (MAXIPIME) 2 g in sodium chloride 0.9 % 100 mL IVPB        2 g 200 mL/hr over 30 Minutes Intravenous  Once 06/04/20 1906 06/04/20 2055   06/04/20 1915  metroNIDAZOLE (FLAGYL) IVPB 500 mg        500 mg 100 mL/hr over 60 Minutes Intravenous  Once 06/04/20 1906 06/04/20 2055   06/04/20 1915  vancomycin (VANCOCIN) IVPB 1000 mg/200 mL premix  Status:  Discontinued        1,000 mg 200 mL/hr over 60 Minutes Intravenous  Once 06/04/20 1906 06/04/20 1912   06/04/20 1915  vancomycin (VANCOREADY) IVPB 1750 mg/350 mL        1,750 mg 175 mL/hr over 120 Minutes Intravenous  Once 06/04/20 1912 06/04/20 2257              Family Communication/Anticipated D/C date and plan/Code Status   DVT prophylaxis: heparin injection 5,000 Units Start: 06/10/20 2200     Code Status: DNR  Family Communication: None Disposition Plan:    Status is: Inpatient  Remains inpatient appropriate because:Inpatient level of care appropriate due to severity of illness  Dispo: The patient is from: Home              Anticipated d/c is to: SNF              Patient currently is not medically stable to d/c.   Difficult to place patient No           Subjective:  Interval events noted.  He is confused and unable to provide any history.   Objective:    Vitals:   06/29/20 2159 06/30/20 0500 06/30/20 0802 06/30/20 1158  BP: (!) 161/82  (!) 169/85 (!) 162/74  Pulse: (!) 102  63 88  Resp:   18 20  Temp:   98.5 F (36.9 C) 98.4 F (36.9 C)  TempSrc:      SpO2:   98% 95%  Weight:  89.4 kg    Height:       No data found.   Intake/Output Summary (Last  24 hours) at 06/30/2020 1536 Last data filed at 06/30/2020 1325 Gross per 24 hour  Intake 520 ml  Output 3600 ml  Net -3080 ml   Filed Weights   06/28/20 0649 06/29/20 0500 06/30/20 0500  Weight: 82.6 kg 85 kg 89.4 kg    Exam:  GEN: NAD SKIN: No rash EYES: EOMI ENT: MMM, NG tube in place CV: RRR PULM: CTA B ABD: soft, ND, NT, +BS CNS: Alert but confused.  Speech is slurred and incomprehensible.  He moves all extremities spontaneously. EXT: No edema or tenderness GU: Foley catheter with amber urine in the back      Data Reviewed:   I have personally reviewed following labs and imaging studies:  Labs: Labs show the following:   Basic Metabolic Panel: Recent Labs  Lab 06/24/20 0504 06/25/20 0558 06/26/20 1040 06/27/20 0630 06/28/20 0624 06/29/20 0500 06/30/20 0431  NA 146*  --  143 144  --  145 145  K 3.8  --  4.2 4.1  --  4.0 3.8  CL 110  --  104 105  --  109 109  CO2 28  --  28 28  --  23 24  GLUCOSE 251*  --  167* 195*  --  153* 182*  BUN 130*  --  124* 100*  --  164* 163*  CREATININE 4.94*  --  5.04* 5.41*  --  6.09* 5.54*  CALCIUM 9.7  --  10.2 10.5*  --  10.0 10.5*  MG 2.5*   < > 2.5* 2.8* 2.9* 2.9* 3.0*  PHOS 8.0*   < > 7.9* 8.6* 8.5* 7.7* 7.8*   < > = values in this interval not displayed.   GFR Estimated Creatinine Clearance: 14.7 mL/min (A) (by C-G formula based on SCr of 5.54 mg/dL (H)). Liver Function Tests: Recent Labs  Lab 06/24/20 0504 06/26/20 1040  AST 46* 32  ALT 72* 68*  ALKPHOS 79 82  BILITOT 0.7 0.6  PROT 6.3* 6.4*  ALBUMIN 2.9* 2.8*   No results for input(s): LIPASE, AMYLASE in the last 168 hours.  No results for input(s): AMMONIA in the last 168 hours. Coagulation profile No results for input(s): INR, PROTIME in the last 168 hours.  CBC: Recent Labs  Lab 06/26/20 1040 06/27/20 0630 06/28/20 0624 06/29/20 0500 06/30/20 0431  WBC 8.2 7.0 6.3 10.1 6.8  NEUTROABS 5.9 5.0 4.8 8.2* 5.0  HGB 8.8* 9.0* 8.9* 8.3* 8.3*   HCT 27.0* 27.1* 27.2* 25.8* 25.7*  MCV 90.9 90.9 91.0 92.1 92.1  PLT 225 230 236 257 250   Cardiac Enzymes: No results for input(s): CKTOTAL, CKMB, CKMBINDEX, TROPONINI in the last 168 hours. BNP (last 3 results) No results for input(s): PROBNP in the last 8760 hours. CBG: Recent Labs  Lab 06/29/20 2043 06/30/20 0002 06/30/20 0357 06/30/20 0800 06/30/20 1159  GLUCAP 137* 95 146* 131* 139*   D-Dimer: No results for  input(s): DDIMER in the last 72 hours. Hgb A1c: No results for input(s): HGBA1C in the last 72 hours. Lipid Profile: No results for input(s): CHOL, HDL, LDLCALC, TRIG, CHOLHDL, LDLDIRECT in the last 72 hours. Thyroid function studies: No results for input(s): TSH, T4TOTAL, T3FREE, THYROIDAB in the last 72 hours.  Invalid input(s): FREET3 Anemia work up: No results for input(s): VITAMINB12, FOLATE, FERRITIN, TIBC, IRON, RETICCTPCT in the last 72 hours. Sepsis Labs: Recent Labs  Lab 06/27/20 0630 06/28/20 0624 06/28/20 1705 06/28/20 2130 06/29/20 0500 06/30/20 0431  PROCALCITON  --   --  0.34  --  1.28 1.04  WBC 7.0 6.3  --   --  10.1 6.8  LATICACIDVEN  --   --  1.6 0.4*  --   --     Microbiology Recent Results (from the past 240 hour(s))  CULTURE, BLOOD (ROUTINE X 2) w Reflex to ID Panel     Status: None (Preliminary result)   Collection Time: 06/28/20  5:05 PM   Specimen: BLOOD  Result Value Ref Range Status   Specimen Description BLOOD RIGHT HAND  Final   Special Requests   Final    BOTTLES DRAWN AEROBIC AND ANAEROBIC Blood Culture results may not be optimal due to an excessive volume of blood received in culture bottles   Culture   Final    NO GROWTH 2 DAYS Performed at [facility], [address] [place], [place] [address]    Report Status PENDING  Incomplete  CULTURE, BLOOD (ROUTINE X 2) w Reflex to ID Panel     Status: None (Preliminary result)   Collection Time: 06/28/20  5:05 PM   Specimen: BLOOD  Result Value Ref Range  Status   Specimen Description BLOOD LEFT ANTECUBITAL  Final   Special Requests   Final    BOTTLES DRAWN AEROBIC AND ANAEROBIC Blood Culture adequate volume   Culture   Final    NO GROWTH 2 DAYS Performed at [facility], [address], [place], [place] [address]    Report Status PENDING  Incomplete  MRSA Next Gen by PCR, Nasal     Status: None   Collection Time: 06/29/20  5:10 PM  Result Value Ref Range Status   MRSA by PCR Next Gen NOT DETECTED NOT DETECTED Final    Comment: (NOTE) The GeneXpert MRSA Assay (FDA approved for NASAL specimens only), is one component of a comprehensive MRSA colonization surveillance program. It is not intended to diagnose MRSA infection nor to guide or monitor treatment for MRSA infections. Test performance is not FDA approved in patients less than 38 years old. Performed at [facility], [address] [place], [place] [address]      Procedures and diagnostic studies:  DG Chest 1 View  Addendum Date: 06/28/2020   ADDENDUM REPORT: 06/28/2020 20:08 ADDENDUM: The proximal portion of the NG tube is likely coiled in the hypopharynx or proximal esophagus. Tip remains at the GE junction. These results were called by telephone at the time of interpretation on 06/28/2020 at 8:08 pm to provider Dr. Sharlene Motts, Who verbally acknowledged these results. Electronically Signed   By: Zetta Bills M.D.   On: 06/28/2020 20:08   Result Date: 06/28/2020 CLINICAL DATA:  Nasogastric tube placement. EXAM: CHEST  1 VIEW COMPARISON:  June 28, 2020. FINDINGS: RIGHT-sided PICC line in stable position. Nasogastric tube in similar position tip at the EG junction. A redundant loop of the nasogastric tube is likely in the hypopharynx. Cardiomediastinal contours  and hilar structures are stable. Mild increased interstitial markings with diffuse patchy opacities and potential cystic change in the LEFT chest unchanged. On limited assessment no acute skeletal process.  IMPRESSION: 1. Nasogastric tube tip in the distal esophagus, tube likely coiled in the hypopharynx. 2. Stable bilateral interstitial and airspace disease. A call is out to the referring provider to further discuss findings in the above case. Electronically Signed: By: Zetta Bills M.D. On: 06/28/2020 19:46   DG Chest 1 View  Addendum Date: 06/28/2020   ADDENDUM REPORT: 06/28/2020 19:03 ADDENDUM: Correction to emergent results call time. Correct time should be 06/28/2020 at 7 o'clock p.m. Electronically Signed   By: Donavan Foil M.D.   On: 06/28/2020 19:03   Result Date: 06/28/2020 CLINICAL DATA:  NG tube placement EXAM: CHEST  1 VIEW COMPARISON:  06/28/2020 FINDINGS: Right upper extremity central venous catheter tip over the SVC. Esophageal tube tip overlies the distal esophagus/GE junction region. Cardiomegaly. Similar patchy bilateral airspace opacities. IMPRESSION: 1. Esophageal tube tip overlies the distal esophagus/GE junction region, consider further advancement for more optimal positioning. 2. Similar cardiomegaly and patchy bilateral airspace disease Critical Value/emergent results were called by telephone at the time of interpretation on 06/28/2020 at 6:28 pm to provider Jennye Boroughs , who verbally acknowledged these results. Electronically Signed: By: Donavan Foil M.D. On: 06/28/2020 18:28   DG Chest Port 1 View  Result Date: 06/28/2020 CLINICAL DATA:  NG tube placement EXAM: PORTABLE CHEST 1 VIEW COMPARISON:  06/28/2020 FINDINGS: Right upper extremity central venous catheter tip over the SVC. Interval advancement of esophageal tube, tip and side port both below the diaphragm but incompletely visualized. No significant change in bilateral airspace opacities IMPRESSION: Interval advancement of esophageal tube, tip and side port are below the diaphragm but the tip is incompletely imaged. Electronically Signed   By: Donavan Foil M.D.   On: 06/28/2020 21:36   DG Chest Port 1 View  Addendum  Date: 06/28/2020   ADDENDUM REPORT: 06/28/2020 17:09 ADDENDUM: In the last addendum it should state "nasogastric tube which should be removed and then replaced." Remaining verbiage in the addendum is correct. Electronically Signed   By: Zetta Bills M.D.   On: 06/28/2020 17:09   Addendum Date: 06/28/2020   ADDENDUM REPORT: 06/28/2020 17:05 ADDENDUM: In addition to the nasogastric tube which should be removed and then repositioned there is a cystic area in the LEFT chest potentially on the current study this could represent an area of post infectious pneumatocele at the site of prior pneumonia. Attention on follow-up was suggested. These results were called by telephone at the time of interpretation on 06/28/2020 at 5:04 pm to provider Jennye Boroughs , who verbally acknowledged these results. Electronically Signed   By: Zetta Bills M.D.   On: 06/28/2020 17:05   Result Date: 06/28/2020 CLINICAL DATA:  Unconscious, fever. EXAM: PORTABLE CHEST 1 VIEW COMPARISON:  Chest x-ray from June 18, 2020. FINDINGS: Heart size and mediastinal contours are stable compared to prior imaging. Diffuse patchy mid chest opacities bilaterally with no sign of lobar consolidation or visible pleural effusion. A gastric tube projects over the mediastinum. The tip is just below the level of the carina but projects over the LEFT mainstem bronchus, position uncertain. RIGHT-sided PICC line tip at the level of the mid superior vena cava. EKG leads project over the chest. On limited assessment no acute skeletal process. IMPRESSION: 1. Diffuse patchy mid chest opacities bilaterally. Suspicious for bilateral pneumonia. 2. A gastric tube projects  over the mediastinum. The tip is just below the level of the carina but projects over the LEFT mainstem bronchus, while potentially in the esophagus the position uncertain. Consider withdrawing tube with repositioning and follow-up radiograph. 3. RIGHT-sided PICC line tip at the level of the mid  superior vena cava. Call is out to the referring provider to further discuss findings in the above case. Electronically Signed: By: Zetta Bills M.D. On: 06/28/2020 16:53               LOS: 26 days   Josilyn Shippee  [facility]   Pager on [email or web address]. If 7PM-7AM, please contact night-coverage at [email or web address]     06/30/2020, 3:36 PM

## 2020-06-30 NOTE — Plan of Care (Signed)
  Problem: Education: Goal: Knowledge of General Education information will improve Description: Including pain rating scale, medication(s)/side effects and non-pharmacologic comfort measures 06/30/2020 1203 by Ansel Bong, RN Outcome: Progressing 06/30/2020 1203 by Ansel Bong, RN Outcome: Progressing   Problem: Health Behavior/Discharge Planning: Goal: Ability to manage health-related needs will improve 06/30/2020 1203 by Ansel Bong, RN Outcome: Progressing 06/30/2020 1203 by Ansel Bong, RN Outcome: Progressing   Problem: Clinical Measurements: Goal: Ability to maintain clinical measurements within normal limits will improve 06/30/2020 1203 by Ansel Bong, RN Outcome: Progressing 06/30/2020 1203 by Ansel Bong, RN Outcome: Progressing Goal: Will remain free from infection 06/30/2020 1203 by Ansel Bong, RN Outcome: Progressing 06/30/2020 1203 by Ansel Bong, RN Outcome: Progressing Goal: Diagnostic test results will improve 06/30/2020 1203 by Ansel Bong, RN Outcome: Progressing 06/30/2020 1203 by Ansel Bong, RN Outcome: Progressing Goal: Respiratory complications will improve 06/30/2020 1203 by Ansel Bong, RN Outcome: Progressing 06/30/2020 1203 by Ansel Bong, RN Outcome: Progressing Goal: Cardiovascular complication will be avoided 06/30/2020 1203 by Ansel Bong, RN Outcome: Progressing 06/30/2020 1203 by Ansel Bong, RN Outcome: Progressing   Problem: Activity: Goal: Risk for activity intolerance will decrease 06/30/2020 1203 by Ansel Bong, RN Outcome: Progressing 06/30/2020 1203 by Ansel Bong, RN Outcome: Progressing   Problem: Nutrition: Goal: Adequate nutrition will be maintained 06/30/2020 1203 by Ansel Bong, RN Outcome: Progressing 06/30/2020 1203 by Ansel Bong, RN Outcome: Progressing   Problem: Coping: Goal: Level of anxiety will decrease 06/30/2020 1203 by Ansel Bong, RN Outcome:  Progressing 06/30/2020 1203 by Ansel Bong, RN Outcome: Progressing   Problem: Elimination: Goal: Will not experience complications related to bowel motility 06/30/2020 1203 by Ansel Bong, RN Outcome: Progressing 06/30/2020 1203 by Ansel Bong, RN Outcome: Progressing Goal: Will not experience complications related to urinary retention 06/30/2020 1203 by Ansel Bong, RN Outcome: Progressing 06/30/2020 1203 by Ansel Bong, RN Outcome: Progressing   Problem: Pain Managment: Goal: General experience of comfort will improve 06/30/2020 1203 by Ansel Bong, RN Outcome: Progressing 06/30/2020 1203 by Ansel Bong, RN Outcome: Progressing   Problem: Safety: Goal: Ability to remain free from injury will improve 06/30/2020 1203 by Ansel Bong, RN Outcome: Progressing 06/30/2020 1203 by Ansel Bong, RN Outcome: Progressing   Problem: Skin Integrity: Goal: Risk for impaired skin integrity will decrease 06/30/2020 1203 by Ansel Bong, RN Outcome: Progressing 06/30/2020 1203 by Ansel Bong, RN Outcome: Progressing

## 2020-07-01 ENCOUNTER — Inpatient Hospital Stay: Payer: No Typology Code available for payment source

## 2020-07-01 DIAGNOSIS — Z66 Do not resuscitate: Secondary | ICD-10-CM

## 2020-07-01 DIAGNOSIS — G9341 Metabolic encephalopathy: Secondary | ICD-10-CM | POA: Diagnosis not present

## 2020-07-01 DIAGNOSIS — J9601 Acute respiratory failure with hypoxia: Secondary | ICD-10-CM | POA: Diagnosis not present

## 2020-07-01 DIAGNOSIS — N179 Acute kidney failure, unspecified: Secondary | ICD-10-CM | POA: Diagnosis not present

## 2020-07-01 DIAGNOSIS — J189 Pneumonia, unspecified organism: Secondary | ICD-10-CM | POA: Diagnosis not present

## 2020-07-01 DIAGNOSIS — N319 Neuromuscular dysfunction of bladder, unspecified: Secondary | ICD-10-CM | POA: Diagnosis not present

## 2020-07-01 LAB — MAGNESIUM: Magnesium: 3 mg/dL — ABNORMAL HIGH (ref 1.7–2.4)

## 2020-07-01 LAB — URINE CULTURE: Culture: >=100000 — AB

## 2020-07-01 LAB — RENAL FUNCTION PANEL
Albumin: 2.8 g/dL — ABNORMAL LOW (ref 3.5–5.0)
Anion gap: 14 (ref 5–15)
BUN: 157 mg/dL — ABNORMAL HIGH (ref 8–23)
CO2: 23 mmol/L (ref 22–32)
Calcium: 10.8 mg/dL — ABNORMAL HIGH (ref 8.9–10.3)
Chloride: 113 mmol/L — ABNORMAL HIGH (ref 98–111)
Creatinine, Ser: 5.47 mg/dL — ABNORMAL HIGH (ref 0.61–1.24)
GFR, Estimated: 11 mL/min — ABNORMAL LOW (ref >60)
Glucose, Bld: 152 mg/dL — ABNORMAL HIGH (ref 70–99)
Phosphorus: 7.1 mg/dL — ABNORMAL HIGH (ref 2.5–4.6)
Potassium: 4.3 mmol/L (ref 3.5–5.1)
Sodium: 150 mmol/L — ABNORMAL HIGH (ref 135–145)

## 2020-07-01 LAB — CBC WITH DIFFERENTIAL/PLATELET
Abs Immature Granulocytes: 0.02 10*3/uL (ref 0.00–0.07)
Basophils Absolute: 0 10*3/uL (ref 0.0–0.1)
Basophils Relative: 1 %
Eosinophils Absolute: 0.3 10*3/uL (ref 0.0–0.5)
Eosinophils Relative: 6 %
HCT: 28.5 % — ABNORMAL LOW (ref 39.0–52.0)
Hemoglobin: 9.2 g/dL — ABNORMAL LOW (ref 13.0–17.0)
Immature Granulocytes: 0 %
Lymphocytes Relative: 17 %
Lymphs Abs: 0.9 10*3/uL (ref 0.7–4.0)
MCH: 29.1 pg (ref 26.0–34.0)
MCHC: 32.3 g/dL (ref 30.0–36.0)
MCV: 90.2 fL (ref 80.0–100.0)
Monocytes Absolute: 0.6 10*3/uL (ref 0.1–1.0)
Monocytes Relative: 11 %
Neutro Abs: 3.3 10*3/uL (ref 1.7–7.7)
Neutrophils Relative %: 65 %
Platelets: 259 10*3/uL (ref 150–400)
RBC: 3.16 MIL/uL — ABNORMAL LOW (ref 4.22–5.81)
RDW: 14.7 % (ref 11.5–15.5)
WBC: 5 10*3/uL (ref 4.0–10.5)
nRBC: 0 % (ref 0.0–0.2)

## 2020-07-01 LAB — GLUCOSE, CAPILLARY
Glucose-Capillary: 130 mg/dL — ABNORMAL HIGH (ref 70–99)
Glucose-Capillary: 133 mg/dL — ABNORMAL HIGH (ref 70–99)
Glucose-Capillary: 135 mg/dL — ABNORMAL HIGH (ref 70–99)
Glucose-Capillary: 144 mg/dL — ABNORMAL HIGH (ref 70–99)
Glucose-Capillary: 151 mg/dL — ABNORMAL HIGH (ref 70–99)
Glucose-Capillary: 167 mg/dL — ABNORMAL HIGH (ref 70–99)

## 2020-07-01 IMAGING — DX DG CHEST 1V
1 series · 1 of 1 positions shown · non-contrast
Comparison: [DATE].

CLINICAL DATA: Nasogastric tube placement.

EXAM:
CHEST  1 VIEW

[chest ap]
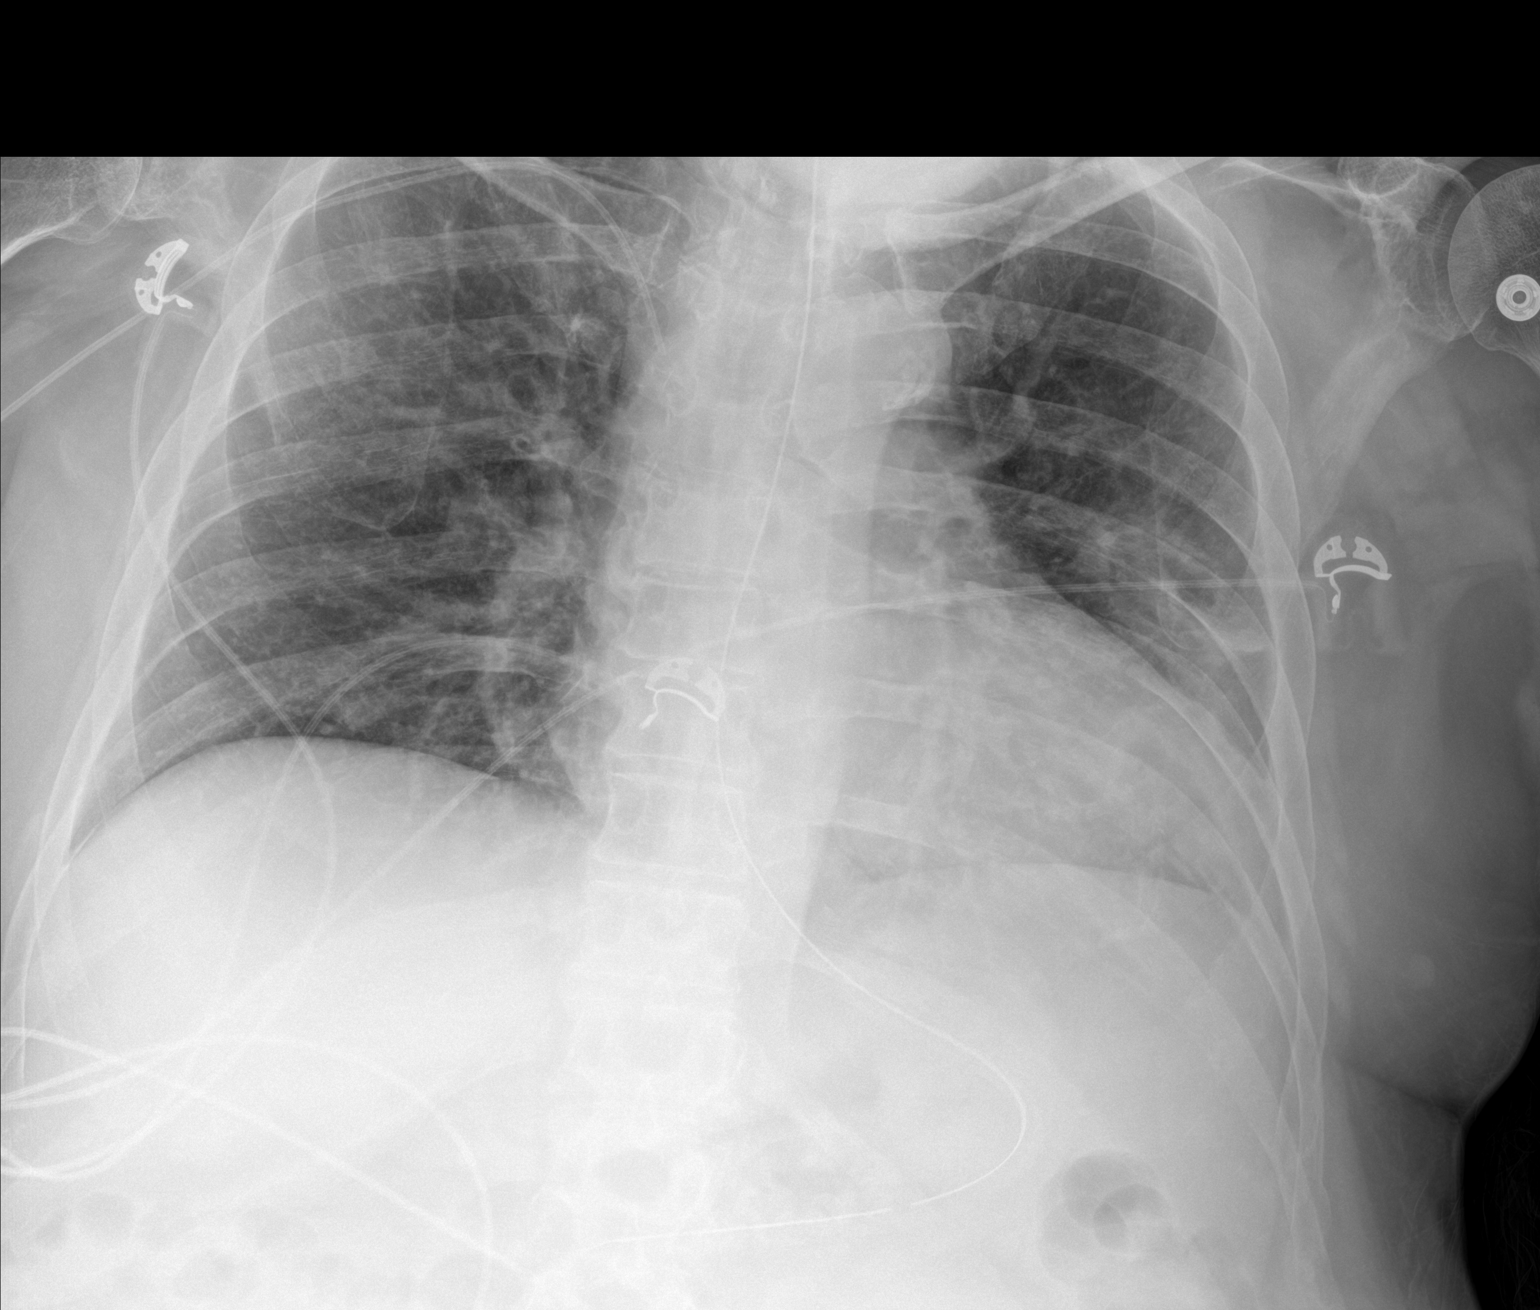

[1 of 1 positions shown; findings below may reference images not displayed]

FINDINGS: Stable cardiomediastinal silhouette. Stable bilateral patchy
airspace opacities. Distal tip of nasogastric tube is seen in
expected position of distal stomach. Bony thorax is unremarkable.
IMPRESSION: Distal tip of nasogastric tube seen in expected position of distal
stomach.

## 2020-07-01 MED ORDER — DEXTROSE 5 % IV SOLN: INTRAVENOUS | Status: AC

## 2020-07-01 NOTE — Progress Notes (Addendum)
Progress Note    Wyatt Ball.  FMB:[id number] DOB: April 25, 1960  DOA: 06/04/2020 PCP: System, Provider Not In      Brief Narrative:    Medical records reviewed and are as summarized below:  Wyatt Ball Brooke Bonito. is a 60 y.o. male with medical history significant for hypertension, PTSD, diabetes, migraine, GERD, neurogenic bladder secondary to remote history of transverse myelitis requiring self urethral catheterization, frequent UTIs, chronic back pain on opioids, generally independent at baseline.  He usually gets his care at the [facility] hospital.  He was brought to the emergency room because of altered mental status, cough, shortness of breath, fever, vomiting and diarrhea.  He was admitted to the hospital for sepsis secondary to pneumonia.  He was transferred to the ICU for septic shock, AKI, COPD exacerbation.  He was intubated and placed on mechanical ventilation.  5/21 admitted for sepsis and pneumonia. 6/2 transferred to ICU, intubated underwent bronchoscopy 6/3 self extubated followed by reintubated 6/7 HD started 6/15 extubated 6/19 transferred to [facility]. Neurology, nephrology, urology and infectious disease consulted.   Assessment/Plan:   Principal Problem:   CAP (community acquired pneumonia) Active Problems:   Acute metabolic encephalopathy   Severe sepsis with septic shock (HCC)   Neurogenic bladder   Chronic, continuous use of opioids   Chronic low back pain   Type 2 diabetes mellitus without complication (HCC)   Migraines   Recurrent UTI   History of colon polyps   Urinary retention   Altered mental status   Pressure injury of skin   AKI (acute kidney injury) ([place])   Nutrition Problem: Inadequate oral intake Etiology: inability to eat  Signs/Symptoms: NPO status   Body mass index is 21.5 kg/m.  (Obesity)    Septic shock secondary to community-acquired pneumonia: He completed 8 days of azithromycin, 3 days of cephalosporin plus 5 days of  IV meropenem followed by 7 days of Levaquin and doxycycline.  S/p bronchoscopy blood cultures were negative.  No growth on blood cultures as well.  New fever and tachycardia on 06/28/2020, new sepsis secondary to bilateral pneumonia: MRSA screen was negative.  Case discussed via secure chat, with Dr. Steva Ready, [place] specialist who had previously been on the case.  She recommended discontinuing antibiotics since there have been any growth on cultures.  Pyuria is probably from Foley catheter.  Follow-up urine and blood cultures.  Acute hypoxic respiratory failure: Resolved.  S/p intubation and extubation.  He is tolerating room air.  AKI, hyperphosphatemia: Creatinine is still elevated but stable.  He has good urine output.  Hemodialysis was initiated on 06/10/2020.  Dialysis catheter has been removed and there is no plan for further hemodialysis.  He is not a good candidate for long-term dialysis per nephrologist.    Hypernatremia: Start 5% dextrose infusion.  Acute metabolic encephalopathy, agitation, history of PTSD: Patient has been evaluated by neurologist.  EEG was unremarkable.  Continue psychotropics and prazosin  Dysphagia: Replace NG tube.  Consult gastroenterologist to evaluate for PEG tube placement.    Hypertension: Continue antihypertensives  Stage II coccygeal decubitus ulcer: Present on admission: Continue foam dressing.  Hyperkalemia: Resolved  Other comorbidities include neurogenic bladder with history of transverse myelitis, chronic pain syndrome on opioids  Plan was discussed with his brother, Frankey Poot.  He wants patient to have a PEG tube.  He said he does not want patient to starve to death.  NG tube will be replaced while waiting for PEG tube placement.  Diet Order             Diet NPO time specified  Diet effective now                      Consultants: Intensivist Urologist Infectious disease Urologist Nephrologist  Procedures: Intubation and  bronchoscopy on on 06/06/2020 Central venous catheter insertion on 06/10/2020    Medications:    amLODipine  5 mg Per Tube Daily   chlorhexidine gluconate (MEDLINE KIT)  15 mL Mouth Rinse BID   Chlorhexidine Gluconate Cloth  6 each Topical Daily   cloNIDine  0.3 mg Transdermal Weekly   feeding supplement (PROSource TF)  45 mL Per Tube Daily   free water  100 mL Per Tube Q4H   gabapentin  100 mg Per Tube Q8H   heparin injection (subcutaneous)  5,000 Units Subcutaneous Q8H   insulin aspart  0-20 Units Subcutaneous Q4H   insulin aspart  3 Units Subcutaneous Q4H   insulin glargine  20 Units Subcutaneous Daily   metoprolol tartrate  25 mg Per Tube BID   multivitamin  1 tablet Per Tube QHS   nutrition supplement (JUVEN)  1 packet Per Tube BID   pantoprazole (PROTONIX) IV  40 mg Intravenous Q24H   prazosin  4 mg Per Tube QHS   QUEtiapine  12.5 mg Per Tube BID   sodium chloride flush  10-40 mL Intracatheter Q12H   traZODone  150 mg Per Tube QHS   Continuous Infusions:  sodium chloride Stopped (06/22/20 1614)   dextrose     feeding supplement (NEPRO CARB STEADY) 1,000 mL (06/30/20 2040)   meropenem (MERREM) IV 1 g (07/01/20 1055)     Anti-infectives (From admission, onward)    Start     Dose/Rate Route Frequency Ordered Stop   06/28/20 1815  vancomycin (VANCOREADY) IVPB 1750 mg/350 mL        1,750 mg 175 mL/hr over 120 Minutes Intravenous  Once 06/28/20 1726 06/28/20 2054   06/28/20 1815  meropenem (MERREM) 1 g in sodium chloride 0.9 % 100 mL IVPB        1 g 200 mL/hr over 30 Minutes Intravenous Every 12 hours 06/28/20 1726     06/28/20 1742  vancomycin variable dose per unstable renal function (pharmacist dosing)  Status:  Discontinued         Does not apply See admin instructions 06/28/20 1742 06/30/20 0757   06/26/20 2100  fluconazole (DIFLUCAN) IVPB 50 mg       See Hyperspace for full Linked Orders Report.   50 mg 25 mL/hr over 60 Minutes Intravenous Every 24 hours 06/25/20  1835 06/30/20 2118   06/25/20 2000  fluconazole (DIFLUCAN) IVPB 200 mg       See Hyperspace for full Linked Orders Report.   200 mg 100 mL/hr over 60 Minutes Intravenous  Once 06/25/20 1835 06/25/20 2348   06/14/20 1800  levofloxacin (LEVAQUIN) IVPB 500 mg        500 mg 100 mL/hr over 60 Minutes Intravenous Every 48 hours 06/12/20 1624 06/19/20 0210   06/12/20 1800  levofloxacin (LEVAQUIN) IVPB 750 mg        750 mg 100 mL/hr over 90 Minutes Intravenous  Once 06/11/20 2239 06/12/20 1904   06/11/20 1800  meropenem (MERREM) 500 mg in sodium chloride 0.9 % 100 mL IVPB  Status:  Discontinued        500 mg 200 mL/hr over 30 Minutes Intravenous Every 24 hours 06/10/20  1444 06/11/20 2231   06/10/20 1300  doxycycline (VIBRAMYCIN) 100 mg in sodium chloride 0.9 % 250 mL IVPB  Status:  Discontinued        100 mg 125 mL/hr over 120 Minutes Intravenous Every 12 hours 06/10/20 1149 06/17/20 1704   06/09/20 1015  meropenem (MERREM) 500 mg in sodium chloride 0.9 % 100 mL IVPB        500 mg 200 mL/hr over 30 Minutes Intravenous Every 12 hours 06/09/20 0922 06/11/20 1203   06/08/20 2200  meropenem (MERREM) 1 g in sodium chloride 0.9 % 100 mL IVPB  Status:  Discontinued        1 g 200 mL/hr over 30 Minutes Intravenous Every 12 hours 06/08/20 0845 06/09/20 0922   06/07/20 2200  azithromycin (ZITHROMAX) 500 mg in sodium chloride 0.9 % 250 mL IVPB        500 mg 250 mL/hr over 60 Minutes Intravenous Every 24 hours 06/07/20 1441 06/11/20 2211   06/07/20 1600  ceFEPIme (MAXIPIME) 2 g in sodium chloride 0.9 % 100 mL IVPB  Status:  Discontinued        2 g 200 mL/hr over 30 Minutes Intravenous Every 8 hours 06/07/20 1441 06/07/20 1442   06/07/20 1600  meropenem (MERREM) 1 g in sodium chloride 0.9 % 100 mL IVPB  Status:  Discontinued        1 g 200 mL/hr over 30 Minutes Intravenous Every 8 hours 06/07/20 1442 06/08/20 0845   06/06/20 1630  vancomycin (VANCOREADY) IVPB 1500 mg/300 mL  Status:  Discontinued         1,500 mg 150 mL/hr over 120 Minutes Intravenous  Once 06/06/20 1533 06/06/20 1632   06/05/20 0600  cefTRIAXone (ROCEPHIN) 2 g in sodium chloride 0.9 % 100 mL IVPB  Status:  Discontinued        2 g 200 mL/hr over 30 Minutes Intravenous Every 24 hours 06/04/20 2029 06/07/20 1439   06/04/20 2200  azithromycin (ZITHROMAX) 500 mg in sodium chloride 0.9 % 250 mL IVPB        500 mg 250 mL/hr over 60 Minutes Intravenous Every 24 hours 06/04/20 2029 06/07/20 0003   06/04/20 1915  ceFEPIme (MAXIPIME) 2 g in sodium chloride 0.9 % 100 mL IVPB        2 g 200 mL/hr over 30 Minutes Intravenous  Once 06/04/20 1906 06/04/20 2055   06/04/20 1915  metroNIDAZOLE (FLAGYL) IVPB 500 mg        500 mg 100 mL/hr over 60 Minutes Intravenous  Once 06/04/20 1906 06/04/20 2055   06/04/20 1915  vancomycin (VANCOCIN) IVPB 1000 mg/200 mL premix  Status:  Discontinued        1,000 mg 200 mL/hr over 60 Minutes Intravenous  Once 06/04/20 1906 06/04/20 1912   06/04/20 1915  vancomycin (VANCOREADY) IVPB 1750 mg/350 mL        1,750 mg 175 mL/hr over 120 Minutes Intravenous  Once 06/04/20 1912 06/04/20 2257              Family Communication/Anticipated D/C date and plan/Code Status   DVT prophylaxis: heparin injection 5,000 Units Start: 06/10/20 2200     Code Status: DNR  Family Communication: None Disposition Plan:    Status is: Inpatient  Remains inpatient appropriate because:Inpatient level of care appropriate due to severity of illness  Dispo: The patient is from: Home              Anticipated d/c is to: SNF  Patient currently is not medically stable to d/c.   Difficult to place patient No           Subjective:   Interval events noted.  Patient pulled out his NG tube.   Objective:    Vitals:   07/01/20 0026 07/01/20 0500 07/01/20 0805 07/01/20 1151  BP: (!) 172/87  (!) 161/82 (!) 162/79  Pulse: (!) 107  (!) 102 91  Resp: 16  18 20   Temp: 98.6 F (37 C)  99 F (37.2  C) 98.9 F (37.2 C)  TempSrc:    Oral  SpO2: 98%  97% 96%  Weight:  60.4 kg    Height:       No data found.   Intake/Output Summary (Last 24 hours) at 07/01/2020 1249 Last data filed at 07/01/2020 0600 Gross per 24 hour  Intake 60 ml  Output 1050 ml  Net -990 ml   Filed Weights   06/29/20 0500 06/30/20 0500 07/01/20 0500  Weight: 85 kg 89.4 kg 60.4 kg    Exam:  GEN: NAD SKIN: Warm and dry EYES: EOMI ENT: MMM CV: RRR PULM: CTA B ABD: soft, ND, +BS CNS: Sleepy? EXT: No edema  GU: Foley catheter in place with amber urine in the bag      Data Reviewed:   I have personally reviewed following labs and imaging studies:  Labs: Labs show the following:   Basic Metabolic Panel: Recent Labs  Lab 06/26/20 1040 06/27/20 0630 06/28/20 0624 06/29/20 0500 06/30/20 0431 07/01/20 0742 07/01/20 0747  NA 143 144  --  145 145  --  150*  K 4.2 4.1  --  4.0 3.8  --  4.3  CL 104 105  --  109 109  --  113*  CO2 28 28  --  23 24  --  23  GLUCOSE 167* 195*  --  153* 182*  --  152*  BUN 124* 100*  --  164* 163*  --  157*  CREATININE 5.04* 5.41*  --  6.09* 5.54*  --  5.47*  CALCIUM 10.2 10.5*  --  10.0 10.5*  --  10.8*  MG 2.5* 2.8* 2.9* 2.9* 3.0* 3.0*  --   PHOS 7.9* 8.6* 8.5* 7.7* 7.8*  --  7.1*   GFR Estimated Creatinine Clearance: 12.1 mL/min (A) (by C-G formula based on SCr of 5.47 mg/dL (H)). Liver Function Tests: Recent Labs  Lab 06/26/20 1040 07/01/20 0747  AST 32  --   ALT 68*  --   ALKPHOS 82  --   BILITOT 0.6  --   PROT 6.4*  --   ALBUMIN 2.8* 2.8*   No results for input(s): LIPASE, AMYLASE in the last 168 hours.  No results for input(s): AMMONIA in the last 168 hours. Coagulation profile No results for input(s): INR, PROTIME in the last 168 hours.  CBC: Recent Labs  Lab 06/27/20 0630 06/28/20 0624 06/29/20 0500 06/30/20 0431 07/01/20 0742  WBC 7.0 6.3 10.1 6.8 5.0  NEUTROABS 5.0 4.8 8.2* 5.0 3.3  HGB 9.0* 8.9* 8.3* 8.3* 9.2*  HCT 27.1*  27.2* 25.8* 25.7* 28.5*  MCV 90.9 91.0 92.1 92.1 90.2  PLT 230 236 257 250 259   Cardiac Enzymes: No results for input(s): CKTOTAL, CKMB, CKMBINDEX, TROPONINI in the last 168 hours. BNP (last 3 results) No results for input(s): PROBNP in the last 8760 hours. CBG: Recent Labs  Lab 06/30/20 1657 06/30/20 2237 07/01/20 0440 07/01/20 0808 07/01/20 1151  GLUCAP 129* 145*  133* 144* 135*   D-Dimer: No results for input(s): DDIMER in the last 72 hours. Hgb A1c: No results for input(s): HGBA1C in the last 72 hours. Lipid Profile: No results for input(s): CHOL, HDL, LDLCALC, TRIG, CHOLHDL, LDLDIRECT in the last 72 hours. Thyroid function studies: No results for input(s): TSH, T4TOTAL, T3FREE, THYROIDAB in the last 72 hours.  Invalid input(s): FREET3 Anemia work up: No results for input(s): VITAMINB12, FOLATE, FERRITIN, TIBC, IRON, RETICCTPCT in the last 72 hours. Sepsis Labs: Recent Labs  Lab 06/28/20 0624 06/28/20 1705 06/28/20 2130 06/29/20 0500 06/30/20 0431 07/01/20 0742  PROCALCITON  --  0.34  --  1.28 1.04  --   WBC 6.3  --   --  10.1 6.8 5.0  LATICACIDVEN  --  1.6 0.4*  --   --   --     Microbiology Recent Results (from the past 240 hour(s))  Urine Culture     Status: None (Preliminary result)   Collection Time: 06/28/20  4:13 PM   Specimen: Urine, Random  Result Value Ref Range Status   Specimen Description   Final    URINE, RANDOM Performed at [facility], [address], [place], [place] [address]    Special Requests   Final    NONE Performed at [facility], [address] [place], [place] [address]    Culture   Final    CULTURE REINCUBATED FOR BETTER GROWTH Performed at [facility], [place] [address] [place], [place] [address]    Report Status PENDING  Incomplete  CULTURE, BLOOD (ROUTINE X 2) w Reflex to ID Panel     Status: None (Preliminary result)   Collection Time: 06/28/20  5:05 PM   Specimen: BLOOD  Result Value Ref  Range Status   Specimen Description BLOOD RIGHT HAND  Final   Special Requests   Final    BOTTLES DRAWN AEROBIC AND ANAEROBIC Blood Culture results may not be optimal due to an excessive volume of blood received in culture bottles   Culture   Final    NO GROWTH 3 DAYS Performed at [facility], [address] [place], [place] [address]    Report Status PENDING  Incomplete  CULTURE, BLOOD (ROUTINE X 2) w Reflex to ID Panel     Status: None (Preliminary result)   Collection Time: 06/28/20  5:05 PM   Specimen: BLOOD  Result Value Ref Range Status   Specimen Description BLOOD LEFT ANTECUBITAL  Final   Special Requests   Final    BOTTLES DRAWN AEROBIC AND ANAEROBIC Blood Culture adequate volume   Culture   Final    NO GROWTH 3 DAYS Performed at [facility], [address] [place], [place] [address]    Report Status PENDING  Incomplete  MRSA Next Gen by PCR, Nasal     Status: None   Collection Time: 06/29/20  5:10 PM  Result Value Ref Range Status   MRSA by PCR Next Gen NOT DETECTED NOT DETECTED Final    Comment: (NOTE) The GeneXpert MRSA Assay (FDA approved for NASAL specimens only), is one component of a comprehensive MRSA colonization surveillance program. It is not intended to diagnose MRSA infection nor to guide or monitor treatment for MRSA infections. Test performance is not FDA approved in patients less than 75 years old. Performed at [facility], [address], [place], [place] [address]      Procedures and diagnostic studies:  DG Chest [facility] [address]  Result Date: 06/30/2020 CLINICAL DATA:  NG tube check EXAM: PORTABLE CHEST 1 VIEW COMPARISON:  06/30/2020 FINDINGS: NG tube tip is in the midthoracic esophagus, unchanged since earlier study. Patchy airspace disease throughout the left lung. Mild cardiomegaly. IMPRESSION: NG tube tip in the midthoracic spine fetus. Patchy opacities throughout the left lung, unchanged. Electronically Signed    By: Rolm Baptise M.D.   On: 06/30/2020 22:12   DG Chest Port 1 View  Result Date: 06/30/2020 CLINICAL DATA:  NG tube check EXAM: PORTABLE CHEST 1 VIEW COMPARISON:  Radiograph 06/28/2020 FINDINGS: Transesophageal tube tip terminates in the midthoracic esophagus with side port in the proximal esophagus just below the level of the thoracic inlet. Right upper extremity PICC tip terminates in the mid SVC. Telemetry leads and support devices overlie the chest. Stable cardiomediastinal contours with a calcified aorta accounting for differences in positioning. No significant interval change in the bilateral heterogeneous airspace opacities, most pronounced in the left mid lung and retrocardiac space. No pneumothorax. Suspect some layering left effusion. No visible right effusion. Degenerative changes are present in the imaged spine and shoulders. No acute or worrisome osseous abnormalities. IMPRESSION: Transesophageal tube has been retracted and now terminates in the midthoracic esophagus and should be advanced nearly 25 cm to position the side port beyond the GE junction. Stable positioning of a right upper extremity PICC. Persistent bilateral heterogeneous airspace opacities without significant change from prior. Trace left effusion. Electronically Signed   By: Lovena Le M.D.   On: 06/30/2020 22:09               LOS: 27 days   Phaedra Colgate  [facility]   Pager on [email or web address]. If 7PM-7AM, please contact night-coverage at [email or web address]     07/01/2020, 12:49 PM

## 2020-07-01 NOTE — Progress Notes (Addendum)
Speech Language Pathology Treatment: Dysphagia  Patient Details Name: Wyatt Ball. MRN: [id number] DOB: 1960/10/29 Today's Date: 07/01/2020 Time: 1105-1140 SLP Time Calculation (min) (ACUTE ONLY): 35 min  Assessment / Plan / Recommendation Clinical Impression  Pt seen for ongoing assessment of status and his readiness(alertness) for trials of po's; safety w/ oral diet. Pt continues to present w/ Encephalopathy and confusion requiring MAX verbal/tactile/visual cues during tasks -- significant inconsistency in his responses despite cues and encouragement. Often Somnolent but yells out "help" intermittently.   Pt continues to present w/ severe encephalopathy c/b decreased attention to task, decreased oral awareness and engagement w/ oral prep tasks, inability to engage w/ functional tasks and problem-solve to take food/drink from spoon or drink from a straw, and inability to follow basic simple directions. When offered trials of ice chips, Nectar liquids and even thin liquids via TSP placed anteriorly in mouth, pt demonstrated significant Cognitive disengagement often letting the boluses lay anteriorly in mouth, then w/ ongoing open mouth posture, allowing anterior leakage. If any bolus residue moved posteriorly and went into the pharynx, pt exhibited a pharyngeal swallow (felt to be delayed in response) w/ decreased excursion. A delayed cough was noted x2 post trials of thin liquids via TSP(shasta coke). Min bolus residue remained on his lips from po trials attempted, no lingual-oral prep movements or licking noted. No consistent pharyngeal swallowing was appreciated during oral stim from the po trials attempted.    Pt often had his head turned to the side w/ NSG assisting in positioning and verbal cues to engage in the po tasks. Pt did not follow any instructions; any movements and responses were non-purposeful suggestive of a [place] Level 2: ("Generalized response - Inconsistent,  non-purposeful, nonspecific response to all stimuli. Responses may be delayed").  Pt was given Max verbal/tactile/visual cues and encouragement during po tasks. Pt did not give verbalizations during session. Pt did not follow basic commands, nor engage w/ this SLP and NSG today.   Given pt's ongoing declined mentation w/ no meaningful (safe) participation in po trials and high risk for aspiration/choking w/ any oral intake, recommend pt continue NPO status w/ frequent oral care for hygiene and stimulation of swallowing. Noted MD/Team and family are choosing to proceed w/ a PEG placement for nutrition/hydration needs. Recommend ST follow post Discharge to next benue of care for ongoing oral stimulation and awareness therapy and PO readiness for oral intake(safely). Recommend ongoing Palliative Care f/u for overall GOC; Dietician following for enteral support.  MD and NSG updated on above.        HPI HPI: Wyatt Finch Montez Hageman. is a 60 y.o. 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 CT chest abdomen and pelvis without contrast consistent with pneumonia. Multiple nodules left upper lobe likely infectious or inflammatory with follow-up imaging recommended to document resolution. Cardiomegaly with trace pericardial effusion.  Pt had a decline in status and was transferred to CCU on 6//2022 and orally intubated. He extubated/reintubated during this time until finally extubated on 06/19/2020. He is on [place] O2 support now, and though verbal yesterday somewhat, he is non-communicative with encephalopathy currently. MD unsure if psychiatric in nature and has  consulted Neurology.  Pt has now had NGs replaced for enteral feedings; PEG option being discussed by MD.      SLP Plan  Discharge SLP treatment due to (comment) (pt to f/u at next venue of care for ongoing dysphagia tx and trials to establish a safe, oral diet to meet nutrition/hydration needs)       Recommendations  Diet recommendations: NPO (PEG option being explored) Medication Administration: Via alternative means                Oral Care Recommendations: Oral care QID;Staff/trained caregiver to provide oral care Follow up Recommendations: Skilled Nursing facility SLP Visit Diagnosis: Dysphagia, oropharyngeal phase (R13.12) (Significant Cognitive decline) Plan: Discharge SLP treatment due to (comment) (pt to f/u at next venue of care for ongoing dysphagia tx and trials to establish a safe, oral diet to meet nutrition/hydration needs)       GO                   Jerilynn Som, MS, CCC-SLP Speech Language Pathologist Rehab Services [phone] [facility] 07/01/2020, 4:58 PM

## 2020-07-01 NOTE — Progress Notes (Signed)
  [facility]  ROUNDING NOTE   Subjective:   Somnolent  Per nursing, pulled NGT out last night  Foley   Assessment/ Plan:  Mr. Wyatt Ball. is a 60 y.o. white male with hypertension, PTSD, diabetes mellitus type II, migraines, GERD, neurogenic bladder, history of transverse myelitis, who is admitted to [facility] on 06/04/2020 for Sepsis (HCC) [A41.9] Altered mental status, unspecified altered mental status type [R41.82] Community acquired pneumonia, unspecified laterality [J18.9]  First dialysis was 6/6.   Acute kidney injury: admitting creatinine of 1.06 with GFR >60. Underlying proteinuria, hematuria, glycosuria consistent with diabetic nephropathy. Patient was also taking ibuprofen regularly at home. [facility] not [facility]. Acute kidney injury secondary to sepsis, ATN, IV contrast. Serologic work up negative. First hemodialysis treatment on June 6. Not a candidate for long term dialysis.   Hypertension: elevated readings, 162/79. Current regimen of prazosin, amlodipine and clonidine patch. Home regimen of lisinopril and metoprolol, both are on hold.   Diabetes mellitus type II with renal manifestations: noninsulin dependent. holding home metformin. Hemoglobin A1c of 7.4%. Glucose stable  Altered mental status: appreciate neurology input. Prazosin and quetiapine.   Based on Palliative Care notes and discussions with patient's brother, they would not like to precede with dialysis. Based on this, we will signoff at this time. We appreciate being apart of this care team and feel free to re-consult if needed.   LOS: 27   6/27/202212:34 PM

## 2020-07-01 NOTE — Progress Notes (Signed)
Daily Progress Note   Patient Name: Wyatt Ball.       Date: 07/01/2020 DOB: 26-Mar-1960  Age: 60 y.o. MRN#: [id number] Attending Physician: Lurene Shadow, MD Primary Care Physician: System, Provider Not In Admit Date: 06/04/2020  Reason for Consultation/Follow-up: Establishing goals of care  Subjective: Chart Reviewed. Updates Received. Patient Assessed.   Somnolent. Will open eyes to verbal stimuli but quickly closes them. Occasionally will yell out per staff. Nutrition remains poor due to dysphagia. Per staff patient pulled NG tube out.  No family at the bedside.   I spoke with patient's brother, Wyatt Ball and provided updates. He shares he has recently received updates from attending also. He expressed his concerns with patient's wax and waning mental state.  He shares patient was much more awake and alert on Saturday.  He was able to recognize his sister during her visit patient and per phone conversations during cyst is presence patient was asking for him.  Discussed at length patient's current illness and comorbidities.  Wyatt Ball is clear in his expressed wishes to continue to treat the treatable.  He shares he and his brother her best friend and he is not ready to give up on him.  He states so may feel he has unrealistic however he lost his brother dearly sharing just days prior to his admission he was ambulatory and walking the dogs.  Brother states he is remaining hopeful that patient will improve with a goal of returning home eventually.  He acknowledges this may be a long and tedious process however he states he is in it for the long haul and again emphasizing he is not ready to consider any other options other than allowing his brother to improve and receive all the care that is available.  He confirms wishes to proceed with PEG tube and or temporary feeding tube.  He states he does not wish for his brother "to starve to death".  Provided extensive education regarding artificial  nutrition.  We discussed best case and worst case scenario acknowledging placement of PEG may not provide patient any meaningful recovery.  Wyatt Ball verbalized understanding expressing he wishes to at least try and take it day by day.  All questions answered and support provided.    Length of Stay: 27 days  Vital Signs: BP (!) 162/79 (BP Location: Left Arm)   Pulse 91   Temp 98.9 F (37.2 C) (Oral)   Resp 20   Ht 5' 5.98" (1.676 m)   Wt 60.4 kg Comment: Simultaneous filing. User may not have seen previous data.  SpO2 96%   BMI 21.50 kg/m  SpO2: SpO2: 96 % O2 Device: O2 Device: Room Air O2 Flow Rate: O2 Flow Rate (Ball/min): 2 Ball/min  Physical Exam: Somnolent, will open eyes briefly on verbal stimuli RRR Normal breathing pattern +BS            Palliative Care Assessment & Plan  HPI: 60 y.o. male  with past medical history of hypertension, PTSD, migraines, GERD, neurogenic bladder due to remote history of transverse myelitis admitted on 06/04/2020 with altered mental status due to eft lung pneumonia sepsis requiring intubation. Hospitalization complicated by prolonged vent support, renal failure requiring dialysis, and ongoing encephalopathy.   Code Status: DNR  Goals of Care/Recommendations: Continue to treat the treatable. Brother expressed goals are to allow patient every opportunity to improve. He is remaining hopeful for this and for patient to eventually return home at some point, acknowledging his understanding of long road  aheadSimonne Ball confirms wishes for artificial nutrition/PEG placement. Detailed discussion and education provided on PEG including best case and worst case scenario. Brother acknowledges expressing wishes for patient "not to starve to death".  PMT will continue to support and follow as needed.    Discharge Planning: To Be Determined  Thank you for allowing the Palliative Medicine Team to assist in the care of this patient.  Time Total: 45 min.   Visit  consisted of counseling and education dealing with the complex and emotionally intense issues of symptom management and palliative care in the setting of serious and potentially life-threatening illness.Greater than 50%  of this time was spent counseling and coordinating care related to the above assessment and plan.  Wyatt Ball, AGPCNP-BC  Palliative Medicine Team [phone]

## 2020-07-02 DIAGNOSIS — G9341 Metabolic encephalopathy: Secondary | ICD-10-CM | POA: Diagnosis not present

## 2020-07-02 DIAGNOSIS — N179 Acute kidney failure, unspecified: Secondary | ICD-10-CM | POA: Diagnosis not present

## 2020-07-02 DIAGNOSIS — J189 Pneumonia, unspecified organism: Secondary | ICD-10-CM | POA: Diagnosis not present

## 2020-07-02 DIAGNOSIS — R1310 Dysphagia, unspecified: Secondary | ICD-10-CM

## 2020-07-02 DIAGNOSIS — N319 Neuromuscular dysfunction of bladder, unspecified: Secondary | ICD-10-CM | POA: Diagnosis not present

## 2020-07-02 DIAGNOSIS — J9601 Acute respiratory failure with hypoxia: Secondary | ICD-10-CM | POA: Diagnosis not present

## 2020-07-02 LAB — GLUCOSE, CAPILLARY
Glucose-Capillary: 178 mg/dL — ABNORMAL HIGH (ref 70–99)
Glucose-Capillary: 193 mg/dL — ABNORMAL HIGH (ref 70–99)
Glucose-Capillary: 150 mg/dL — ABNORMAL HIGH (ref 70–99)
Glucose-Capillary: 124 mg/dL — ABNORMAL HIGH (ref 70–99)
Glucose-Capillary: 159 mg/dL — ABNORMAL HIGH (ref 70–99)

## 2020-07-02 LAB — BASIC METABOLIC PANEL
Anion gap: 11 (ref 5–15)
BUN: 136 mg/dL — ABNORMAL HIGH (ref 8–23)
CO2: 25 mmol/L (ref 22–32)
Calcium: 10.4 mg/dL — ABNORMAL HIGH (ref 8.9–10.3)
Chloride: 114 mmol/L — ABNORMAL HIGH (ref 98–111)
Creatinine, Ser: 5.44 mg/dL — ABNORMAL HIGH (ref 0.61–1.24)
GFR, Estimated: 11 mL/min — ABNORMAL LOW (ref >60)
Glucose, Bld: 202 mg/dL — ABNORMAL HIGH (ref 70–99)
Potassium: 3.6 mmol/L (ref 3.5–5.1)
Sodium: 150 mmol/L — ABNORMAL HIGH (ref 135–145)

## 2020-07-02 MED ORDER — SODIUM CHLORIDE 0.9 % IV SOLN: INTRAVENOUS | Status: DC

## 2020-07-02 NOTE — Progress Notes (Signed)
   Daily Progress Note   Patient Name: Wyatt Ball.       Date: 07/02/2020 DOB: 03/06/1960  Age: 60 y.o. MRN#: [id number] Attending Physician: Lurene Shadow, MD Primary Care Physician: System, Provider Not In Admit Date: 06/04/2020  Reason for Consultation/Follow-up: Establishing goals of care  Subjective: Chart Reviewed. Updates Received. Patient Assessed.   Patient awake on my arrival. States "hey" as I walk in the room. More awake and alert today compared to yesterday. NG tube has been reinserted and tube feedings are going. Seems to be tolerating well.   Patient was able to state "no" when asked about pain. Is alert to self although with slow slurred speech. No family at the bedside.   I called and spoke with his brother. Updates provided. He stated he plans to visit today or tomorrow. Goals remain clear for PEG tube to allow patient an opportunity to improve.   All questions answered and support provided.    Length of Stay: 28 days  Vital Signs: BP (!) 153/73 (BP Location: Left Arm)   Pulse 92   Temp 99.5 F (37.5 C)   Resp 20   Ht 5' 5.98" (1.676 m)   Wt 86.2 kg Comment: last entry incorrect  SpO2 97%   BMI 30.69 kg/m  SpO2: SpO2: 97 % O2 Device: O2 Device: Room Air O2 Flow Rate: O2 Flow Rate (L/min): 2 L/min  Physical Exam: NAD, more awake and alert today, mittens in place RRR Normal breathing pattern +BS          Alert to self, denies pain   Palliative Care Assessment & Plan  HPI: 60 y.o. male  with past medical history of hypertension, PTSD, migraines, GERD, neurogenic bladder due to remote history of transverse myelitis admitted on 06/04/2020 with altered mental status due to eft lung pneumonia sepsis requiring intubation. Hospitalization complicated by prolonged vent support, renal failure requiring dialysis, and ongoing encephalopathy.   Code Status: DNR  Goals of Care/Recommendations: Continue to treat the treatable. Family is remaining hopeful  for some stability/improvement. Brother has requested PEG tube for feedings. NG tube has been replaced for temporary nutrition pending GI evaluation.  PMT will continue to support and follow as needed.    Discharge Planning: To Be Determined  Thank you for allowing the Palliative Medicine Team to assist in the care of this patient.  Time Total: 40 min.   Visit consisted of counseling and education dealing with the complex and emotionally intense issues of symptom management and palliative care in the setting of serious and potentially life-threatening illness.Greater than 50%  of this time was spent counseling and coordinating care related to the above assessment and plan.  Willette Alma, AGPCNP-BC  Palliative Medicine Team [phone]

## 2020-07-02 NOTE — Progress Notes (Addendum)
Progress Note    Creighton [employer].  FIE:[id number] DOB: 1960-09-07  DOA: 06/04/2020 PCP: System, Provider Not In      Brief Narrative:    Medical records reviewed and are as summarized below:  Wyatt Chord Masoud Brooke Bonito. is a 60 y.o. male with medical history significant for hypertension, PTSD, diabetes, migraine, GERD, neurogenic bladder secondary to remote history of transverse myelitis requiring self urethral catheterization, frequent UTIs, chronic back pain on opioids, generally independent at baseline.  He usually gets his care at the [facility] hospital.  He was brought to the emergency room because of altered mental status, cough, shortness of breath, fever, vomiting and diarrhea.  He was admitted to the hospital for sepsis secondary to pneumonia.  He was transferred to the ICU for septic shock, AKI, COPD exacerbation.  He was intubated and placed on mechanical ventilation.  He was treated with empiric IV antibiotics including IV fluconazole for yeast in sputum, IV fluids.  NG tube was placed for enteral nutrition and medications because of severe dysphagia.  Hemodialysis was discontinued because he is not a candidate for long-term hemodialysis per nephrologist.  5/21 admitted for sepsis and pneumonia. 6/2 transferred to ICU, intubated underwent bronchoscopy 6/3 self extubated followed by reintubated 6/7 HD started 6/15 extubated 6/19 transferred to [facility] Neurology, nephrology, urology and infectious disease consulted.   After transfer to progressive care unit, he developed fever and tachycardia that was concerning for sepsis on 06/28/2020.  He was restarted on IV antibiotics (meropenem and vancomycin).  Repeat blood culture did not show any growth.  Gastroenterologist was consulted for PEG tube placement per family's request.  Assessment/Plan:   Principal Problem:   CAP (community acquired pneumonia) Active Problems:   Acute metabolic encephalopathy   Severe sepsis with septic  shock (HCC)   Neurogenic bladder   Chronic, continuous use of opioids   Chronic low back pain   Type 2 diabetes mellitus without complication (HCC)   Migraines   Recurrent UTI   History of colon polyps   Urinary retention   Altered mental status   Pressure injury of skin   AKI (acute kidney injury) ([place])   Nutrition Problem: Inadequate oral intake Etiology: inability to eat  Signs/Symptoms: NPO status   Body mass index is 30.69 kg/m.  (Obesity)    Septic shock secondary to community-acquired pneumonia: He completed 8 days of azithromycin, 3 days of cephalosporin plus 5 days of IV meropenem followed by 7 days of Levaquin and doxycycline.  S/p bronchoscopy blood cultures were negative.  No growth on blood cultures .  New fever and tachycardia on 06/28/2020, new sepsis secondary to bilateral pneumonia: MRSA screen was negative.  No growth on repeat blood cultures.  He was treated with IV vancomycin from 06/28/2020 through 06/30/2020 and IV meropenem from 06/28/2020 through 07/01/2020.  Urine culture from 06/28/2020 showed yeast.  Of note, patient had previously been treated with IV fluconazole from 06/25/2020 through 06/30/2020.  Acute hypoxic respiratory failure: Resolved.  S/p intubation and extubation.  He is tolerating room air.  AKI, hyperphosphatemia: Creatinine is still elevated but stable.  He has good urine output.  Hemodialysis was initiated on 06/10/2020.  Dialysis catheter has been removed and there is no plan for further hemodialysis.  He is not a good candidate for long-term dialysis per nephrologist.    Hypernatremia: Continue IV 5% dextrose infusion for hypernatremia.  Monitor BMP.  Acute metabolic encephalopathy, agitation, history of PTSD: Patient has been evaluated by neurologist.  EEG  was unremarkable.  Continue psychotropics and prazosin  Dysphagia: Continue enteral nutrition via NG tube.  Dr. Vicente Males, gastroenterologist, has been consulted for PEG tube  placement.  Hypertension: Continue antihypertensives  Stage II coccygeal decubitus ulcer: Present on admission: Continue foam dressing.  Hyperkalemia: Resolved  Other comorbidities include neurogenic bladder with history of transverse myelitis, chronic pain syndrome on opioids      Diet Order             Diet NPO time specified  Diet effective now                      Consultants: Intensivist Urologist Infectious disease Urologist Nephrologist Gastroenterologist Palliative care  Procedures: Intubation and bronchoscopy on on 06/06/2020 Central venous catheter insertion on 06/10/2020    Medications:    amLODipine  5 mg Per Tube Daily   chlorhexidine gluconate (MEDLINE KIT)  15 mL Mouth Rinse BID   Chlorhexidine Gluconate Cloth  6 each Topical Daily   cloNIDine  0.3 mg Transdermal Weekly   feeding supplement (PROSource TF)  45 mL Per Tube Daily   free water  100 mL Per Tube Q4H   gabapentin  100 mg Per Tube Q8H   heparin injection (subcutaneous)  5,000 Units Subcutaneous Q8H   insulin aspart  0-20 Units Subcutaneous Q4H   insulin aspart  3 Units Subcutaneous Q4H   insulin glargine  20 Units Subcutaneous Daily   metoprolol tartrate  25 mg Per Tube BID   multivitamin  1 tablet Per Tube QHS   nutrition supplement (JUVEN)  1 packet Per Tube BID   pantoprazole (PROTONIX) IV  40 mg Intravenous Q24H   prazosin  4 mg Per Tube QHS   QUEtiapine  12.5 mg Per Tube BID   sodium chloride flush  10-40 mL Intracatheter Q12H   traZODone  150 mg Per Tube QHS   Continuous Infusions:  sodium chloride Stopped (06/22/20 1614)   dextrose 75 mL/hr at 07/02/20 0258   feeding supplement (NEPRO CARB STEADY) 1,000 mL (06/30/20 2040)     Anti-infectives (From admission, onward)    Start     Dose/Rate Route Frequency Ordered Stop   06/28/20 1815  vancomycin (VANCOREADY) IVPB 1750 mg/350 mL        1,750 mg 175 mL/hr over 120 Minutes Intravenous  Once 06/28/20 1726 06/28/20  2054   06/28/20 1815  meropenem (MERREM) 1 g in sodium chloride 0.9 % 100 mL IVPB  Status:  Discontinued        1 g 200 mL/hr over 30 Minutes Intravenous Every 12 hours 06/28/20 1726 07/01/20 1558   06/28/20 1742  vancomycin variable dose per unstable renal function (pharmacist dosing)  Status:  Discontinued         Does not apply See admin instructions 06/28/20 1742 06/30/20 0757   06/26/20 2100  fluconazole (DIFLUCAN) IVPB 50 mg       See Hyperspace for full Linked Orders Report.   50 mg 25 mL/hr over 60 Minutes Intravenous Every 24 hours 06/25/20 1835 06/30/20 2118   06/25/20 2000  fluconazole (DIFLUCAN) IVPB 200 mg       See Hyperspace for full Linked Orders Report.   200 mg 100 mL/hr over 60 Minutes Intravenous  Once 06/25/20 1835 06/25/20 2348   06/14/20 1800  levofloxacin (LEVAQUIN) IVPB 500 mg        500 mg 100 mL/hr over 60 Minutes Intravenous Every 48 hours 06/12/20 1624 06/19/20 0210   06/12/20 1800  levofloxacin (LEVAQUIN) IVPB 750 mg        750 mg 100 mL/hr over 90 Minutes Intravenous  Once 06/11/20 2239 06/12/20 1904   06/11/20 1800  meropenem (MERREM) 500 mg in sodium chloride 0.9 % 100 mL IVPB  Status:  Discontinued        500 mg 200 mL/hr over 30 Minutes Intravenous Every 24 hours 06/10/20 1444 06/11/20 2231   06/10/20 1300  doxycycline (VIBRAMYCIN) 100 mg in sodium chloride 0.9 % 250 mL IVPB  Status:  Discontinued        100 mg 125 mL/hr over 120 Minutes Intravenous Every 12 hours 06/10/20 1149 06/17/20 1704   06/09/20 1015  meropenem (MERREM) 500 mg in sodium chloride 0.9 % 100 mL IVPB        500 mg 200 mL/hr over 30 Minutes Intravenous Every 12 hours 06/09/20 0922 06/11/20 1203   06/08/20 2200  meropenem (MERREM) 1 g in sodium chloride 0.9 % 100 mL IVPB  Status:  Discontinued        1 g 200 mL/hr over 30 Minutes Intravenous Every 12 hours 06/08/20 0845 06/09/20 0922   06/07/20 2200  azithromycin (ZITHROMAX) 500 mg in sodium chloride 0.9 % 250 mL IVPB        500  mg 250 mL/hr over 60 Minutes Intravenous Every 24 hours 06/07/20 1441 06/11/20 2211   06/07/20 1600  ceFEPIme (MAXIPIME) 2 g in sodium chloride 0.9 % 100 mL IVPB  Status:  Discontinued        2 g 200 mL/hr over 30 Minutes Intravenous Every 8 hours 06/07/20 1441 06/07/20 1442   06/07/20 1600  meropenem (MERREM) 1 g in sodium chloride 0.9 % 100 mL IVPB  Status:  Discontinued        1 g 200 mL/hr over 30 Minutes Intravenous Every 8 hours 06/07/20 1442 06/08/20 0845   06/06/20 1630  vancomycin (VANCOREADY) IVPB 1500 mg/300 mL  Status:  Discontinued        1,500 mg 150 mL/hr over 120 Minutes Intravenous  Once 06/06/20 1533 06/06/20 1632   06/05/20 0600  cefTRIAXone (ROCEPHIN) 2 g in sodium chloride 0.9 % 100 mL IVPB  Status:  Discontinued        2 g 200 mL/hr over 30 Minutes Intravenous Every 24 hours 06/04/20 2029 06/07/20 1439   06/04/20 2200  azithromycin (ZITHROMAX) 500 mg in sodium chloride 0.9 % 250 mL IVPB        500 mg 250 mL/hr over 60 Minutes Intravenous Every 24 hours 06/04/20 2029 06/07/20 0003   06/04/20 1915  ceFEPIme (MAXIPIME) 2 g in sodium chloride 0.9 % 100 mL IVPB        2 g 200 mL/hr over 30 Minutes Intravenous  Once 06/04/20 1906 06/04/20 2055   06/04/20 1915  metroNIDAZOLE (FLAGYL) IVPB 500 mg        500 mg 100 mL/hr over 60 Minutes Intravenous  Once 06/04/20 1906 06/04/20 2055   06/04/20 1915  vancomycin (VANCOCIN) IVPB 1000 mg/200 mL premix  Status:  Discontinued        1,000 mg 200 mL/hr over 60 Minutes Intravenous  Once 06/04/20 1906 06/04/20 1912   06/04/20 1915  vancomycin (VANCOREADY) IVPB 1750 mg/350 mL        1,750 mg 175 mL/hr over 120 Minutes Intravenous  Once 06/04/20 1912 06/04/20 2257              Family Communication/Anticipated D/C date and plan/Code Status   DVT prophylaxis:  heparin injection 5,000 Units Start: 06/10/20 2200     Code Status: DNR  Family Communication: None Disposition Plan:    Status is: Inpatient  Remains  inpatient appropriate because:Inpatient level of care appropriate due to severity of illness  Dispo: The patient is from: Home              Anticipated d/c is to: SNF              Patient currently is not medically stable to d/c.   Difficult to place patient No           Subjective:   Interval events noted.  He is unable to provide any history.   Objective:    Vitals:   07/02/20 0310 07/02/20 0735 07/02/20 1032 07/02/20 1127  BP: (!) 169/78 (!) 162/76 (!) 143/51 (!) 158/80  Pulse: 92 97 94 80  Resp: _0 Temp: 98.7 F (37.1 C) 98.2 F (36.8 C) 98.8 F (37.1 C) 98.7 F (37.1 C)  TempSrc:  Oral    SpO2: 99% 99% 100% 97%  Weight: 86.2 kg     Height:       No data found.   Intake/Output Summary (Last 24 hours) at 07/02/2020 1129 Last data filed at 07/02/2020 0600 Gross per 24 hour  Intake 1959.72 ml  Output 1901 ml  Net 58.72 ml   Filed Weights   06/30/20 0500 07/01/20 0500 07/02/20 0310  Weight: 89.4 kg 60.4 kg 86.2 kg    Exam:  GEN: NAD SKIN: Chronic erythematous patchy rash on anterior aspect of distal left forearm.  Unstageable medial coccygeal decubitus ulcer, stage II posterior coccygeal decubitus ulcer (these were present when I took over his care on 06/26/2020)  EYES: No pallor or icterus ENT: MMM, NG tube in place CV: RRR PULM: CTA B ABD: soft, ND, NT, +BS CNS: Alert and oriented to person, speech is slurred and incomprehensible.  He moves all 4 extremities spontaneously EXT: No edema or tenderness        Data Reviewed:   I have personally reviewed following labs and imaging studies:  Labs: Labs show the following:   Basic Metabolic Panel: Recent Labs  Lab 06/27/20 0630 06/28/20 0624 06/29/20 0500 06/30/20 0431 07/01/20 0742 07/01/20 0747 07/02/20 0428  NA 144  --  145 145  --  150* 150*  K 4.1  --  4.0 3.8  --  4.3 3.6  CL 105  --  109 109  --  113* 114*  CO2 28  --  23 24  --  23 25  GLUCOSE 195*  --  153* 182*   --  152* 202*  BUN 100*  --  164* 163*  --  157* 136*  CREATININE 5.41*  --  6.09* 5.54*  --  5.47* 5.44*  CALCIUM 10.5*  --  10.0 10.5*  --  10.8* 10.4*  MG 2.8* 2.9* 2.9* 3.0* 3.0*  --   --   PHOS 8.6* 8.5* 7.7* 7.8*  --  7.1*  --    GFR Estimated Creatinine Clearance: 14.7 mL/min (A) (by C-G formula based on SCr of 5.44 mg/dL (H)). Liver Function Tests: Recent Labs  Lab 06/26/20 1040 07/01/20 0747  AST 32  --   ALT 68*  --   ALKPHOS 82  --   BILITOT 0.6  --   PROT 6.4*  --   ALBUMIN 2.8* 2.8*   No results for input(s): LIPASE, AMYLASE in the last 168  hours.  No results for input(s): AMMONIA in the last 168 hours. Coagulation profile No results for input(s): INR, PROTIME in the last 168 hours.  CBC: Recent Labs  Lab 06/27/20 0630 06/28/20 0624 06/29/20 0500 06/30/20 0431 07/01/20 0742  WBC 7.0 6.3 10.1 6.8 5.0  NEUTROABS 5.0 4.8 8.2* 5.0 3.3  HGB 9.0* 8.9* 8.3* 8.3* 9.2*  HCT 27.1* 27.2* 25.8* 25.7* 28.5*  MCV 90.9 91.0 92.1 92.1 90.2  PLT 230 236 257 250 259   Cardiac Enzymes: No results for input(s): CKTOTAL, CKMB, CKMBINDEX, TROPONINI in the last 168 hours. BNP (last 3 results) No results for input(s): PROBNP in the last 8760 hours. CBG: Recent Labs  Lab 07/01/20 1529 07/01/20 2002 07/01/20 2339 07/02/20 0307 07/02/20 0736  GLUCAP 167* 151* 130* 178* 193*   D-Dimer: No results for input(s): DDIMER in the last 72 hours. Hgb A1c: No results for input(s): HGBA1C in the last 72 hours. Lipid Profile: No results for input(s): CHOL, HDL, LDLCALC, TRIG, CHOLHDL, LDLDIRECT in the last 72 hours. Thyroid function studies: No results for input(s): TSH, T4TOTAL, T3FREE, THYROIDAB in the last 72 hours.  Invalid input(s): FREET3 Anemia work up: No results for input(s): VITAMINB12, FOLATE, FERRITIN, TIBC, IRON, RETICCTPCT in the last 72 hours. Sepsis Labs: Recent Labs  Lab 06/28/20 0624 06/28/20 1705 06/28/20 2130 06/29/20 0500 06/30/20 0431  07/01/20 0742  PROCALCITON  --  0.34  --  1.28 1.04  --   WBC 6.3  --   --  10.1 6.8 5.0  LATICACIDVEN  --  1.6 0.4*  --   --   --     Microbiology Recent Results (from the past 240 hour(s))  Urine Culture     Status: Abnormal   Collection Time: 06/28/20  4:13 PM   Specimen: Urine, Random  Result Value Ref Range Status   Specimen Description URINE, RANDOM  Final   Special Requests NONE  Final   Culture >=100,000 COLONIES/mL YEAST (A)  Final   Report Status 07/01/2020 FINAL  Final  CULTURE, BLOOD (ROUTINE X 2) w Reflex to ID Panel     Status: None (Preliminary result)   Collection Time: 06/28/20  5:05 PM   Specimen: BLOOD  Result Value Ref Range Status   Specimen Description BLOOD RIGHT HAND  Final   Special Requests   Final    BOTTLES DRAWN AEROBIC AND ANAEROBIC Blood Culture results may not be optimal due to an excessive volume of blood received in culture bottles   Culture   Final    NO GROWTH 4 DAYS Performed at [facility], [address] [place], [place] [address]    Report Status PENDING  Incomplete  CULTURE, BLOOD (ROUTINE X 2) w Reflex to ID Panel     Status: None (Preliminary result)   Collection Time: 06/28/20  5:05 PM   Specimen: BLOOD  Result Value Ref Range Status   Specimen Description BLOOD LEFT ANTECUBITAL  Final   Special Requests   Final    BOTTLES DRAWN AEROBIC AND ANAEROBIC Blood Culture adequate volume   Culture   Final    NO GROWTH 4 DAYS Performed at [facility], [address] [place], [place] [address]    Report Status PENDING  Incomplete  MRSA Next Gen by PCR, Nasal     Status: None   Collection Time: 06/29/20  5:10 PM  Result Value Ref Range Status   MRSA by PCR Next Gen NOT DETECTED NOT DETECTED Final  Comment: (NOTE) The GeneXpert MRSA Assay (FDA approved for NASAL specimens only), is one component of a comprehensive MRSA colonization surveillance program. It is not intended to diagnose MRSA infection nor to  guide or monitor treatment for MRSA infections. Test performance is not FDA approved in patients less than 28 years old. Performed at [facility], [address], [place], [place] [address]      Procedures and diagnostic studies:  DG Chest 1 View  Result Date: 07/01/2020 CLINICAL DATA:  Nasogastric tube placement. EXAM: CHEST  1 VIEW COMPARISON:  June 30, 2020. FINDINGS: Stable cardiomediastinal silhouette. Stable bilateral patchy airspace opacities. Distal tip of nasogastric tube is seen in expected position of distal stomach. Bony thorax is unremarkable. IMPRESSION: Distal tip of nasogastric tube seen in expected position of distal stomach. Electronically Signed   By: Marijo Conception M.D.   On: 07/01/2020 14:10   DG Chest Port 1 View  Result Date: 06/30/2020 CLINICAL DATA:  NG tube check EXAM: PORTABLE CHEST 1 VIEW COMPARISON:  06/30/2020 FINDINGS: NG tube tip is in the midthoracic esophagus, unchanged since earlier study. Patchy airspace disease throughout the left lung. Mild cardiomegaly. IMPRESSION: NG tube tip in the midthoracic spine fetus. Patchy opacities throughout the left lung, unchanged. Electronically Signed   By: Rolm Baptise M.D.   On: 06/30/2020 22:12   DG Chest Port 1 View  Result Date: 06/30/2020 CLINICAL DATA:  NG tube check EXAM: PORTABLE CHEST 1 VIEW COMPARISON:  Radiograph 06/28/2020 FINDINGS: Transesophageal tube tip terminates in the midthoracic esophagus with side port in the proximal esophagus just below the level of the thoracic inlet. Right upper extremity PICC tip terminates in the mid SVC. Telemetry leads and support devices overlie the chest. Stable cardiomediastinal contours with a calcified aorta accounting for differences in positioning. No significant interval change in the bilateral heterogeneous airspace opacities, most pronounced in the left mid lung and retrocardiac space. No pneumothorax. Suspect some layering left effusion. No visible right  effusion. Degenerative changes are present in the imaged spine and shoulders. No acute or worrisome osseous abnormalities. IMPRESSION: Transesophageal tube has been retracted and now terminates in the midthoracic esophagus and should be advanced nearly 25 cm to position the side port beyond the GE junction. Stable positioning of a right upper extremity PICC. Persistent bilateral heterogeneous airspace opacities without significant change from prior. Trace left effusion. Electronically Signed   By: Lovena Le M.D.   On: 06/30/2020 22:09               LOS: 28 days   Dellia Donnelly  [facility]   Pager on [email or web address]. If 7PM-7AM, please contact night-coverage at [email or web address]     07/02/2020, 11:29 AM

## 2020-07-02 NOTE — Consult Note (Signed)
Wyatt Ball , MD [address], [address], [place], [place], [address] [address], [address], [place], [place], [address] Phone: [phone]  Fax: [phone]  Consultation  Referring Provider:     Dr Wyatt Ball Primary Care Physician:  System, Provider Not In Primary Gastroenterologist:  None          Reason for Consultation:     PEG tube   Date of Admission:  06/04/2020 Date of Consultation:  07/02/2020         HPI:   Wyatt Ball. is a 60 y.o. male with history of hypertension diabetes GERD neurogenic bladder admitted to the hospital with sepsis on Jun 04, 2020.  Treated for community-acquired pneumonia.  On admission had AKI.  He had to be intubated and subsequently self extubated.  He has had a few with dysphagia.  Has been on an NG tube feed.  Has decubitus ulcers.  I have been consulted for a PEG tube placement.  Patient unable to give any history , I called and spoke with his brother Wyatt Ball who states they absolutely wanted a G tube to help improve is nutrition status to help get better soon   Past Medical History:  Diagnosis Date  . Anxiety   . DM (diabetes mellitus) (HCC)   . HTN (hypertension)   . Transverse myelitis [facility])     Past Surgical History:  Procedure Laterality Date  . TONSILLECTOMY    . TOTAL HIP ARTHROPLASTY      Prior to Admission medications   Medication Sig Start Date End Date Taking? Authorizing Provider  Alogliptin Benzoate 12.5 MG TABS Take 12.5 mg by mouth daily.   Yes [provider]  aspirin 81 MG chewable tablet Chew 81 mg by mouth daily.   Yes [provider]  cyclobenzaprine (FLEXERIL) 10 MG tablet Take 10 mg by mouth 3 (three) times daily as needed for muscle spasms.   Yes [provider]  Docusate Sodium (DSS) 100 MG CAPS Take 200 mg by mouth 2 (two) times daily.   Yes [provider]  DULoxetine (CYMBALTA) 60 MG capsule Take 120 mg by mouth daily.   Yes [provider]  gabapentin  (NEURONTIN) 600 MG tablet Take 1,200 mg by mouth 3 (three) times daily.   Yes [provider]  guaiFENesin (MUCINEX) 600 MG 12 hr tablet Take 600 mg by mouth 2 (two) times daily.   Yes [provider]  hydrOXYzine (ATARAX/VISTARIL) 50 MG tablet Take 50 mg by mouth every 6 (six) hours as needed for anxiety.   Yes [provider]  ibuprofen (ADVIL) 800 MG tablet Take 800 mg by mouth every 8 (eight) hours as needed for mild pain.   Yes [provider]  latanoprost (XALATAN) 0.005 % ophthalmic solution Place 1 drop into both eyes at bedtime.   Yes [provider]  lisinopril (ZESTRIL) 40 MG tablet Take 20 mg by mouth 2 (two) times daily.   Yes [provider]  loratadine (CLARITIN) 10 MG tablet Take 10 mg by mouth daily.   Yes [provider]  metFORMIN (GLUCOPHAGE) 1000 MG tablet Take 1,000 mg by mouth 2 (two) times daily.   Yes [provider]  metoprolol (TOPROL-XL) 200 MG 24 hr tablet Take 200 mg by mouth daily.   Yes [provider]  morphine (MS CONTIN) 15 MG 12 hr tablet Take 15 mg by mouth 2 (two) times daily.   Yes [provider]  omeprazole (PRILOSEC) 20  MG capsule Take 20 mg by mouth 2 (two) times daily.   Yes [provider]  oxyCODONE-acetaminophen (PERCOCET/ROXICET) 5-325 MG tablet Take 1 tablet by mouth 2 (two) times daily as needed for breakthrough pain.   Yes [provider]  prazosin (MINIPRESS) 1 MG capsule Take 1 mg by mouth 2 (two) times daily as needed (PTSD symptoms).   Yes [provider]  prazosin (MINIPRESS) 2 MG capsule Take 4 mg by mouth at bedtime.   Yes [provider]  promethazine (PHENERGAN) 25 MG tablet Take 25 mg by mouth 3 (three) times daily as needed for nausea.   Yes [provider]  sildenafil (VIAGRA) 100 MG tablet Take 50 mg by mouth daily as needed for erectile dysfunction.   Yes [provider]  simvastatin (ZOCOR) 80  MG tablet Take 80 mg by mouth every evening.   Yes [provider]  traZODone (DESYREL) 150 MG tablet Take 150 mg by mouth at bedtime.   Yes [provider]    Family History  Problem Relation Age of Onset  . CAD Mother   . Diabetes Mother   . CAD Father   . Diabetes Father   . CAD Brother   . Diabetes Brother      Social History   Tobacco Use  . Smoking status: Every Day    Pack years: 0.00  . Smokeless tobacco: Never  Substance Use Topics  . Alcohol use: Never    Allergies as of 06/04/2020  . (No Known Allergies)    Review of Systems:    Patient unable to provide ROS   Physical Exam:  Vital signs in last 24 hours: Temp:  [97.6 F (36.4 C)-98.9 F (37.2 C)] 98.2 F (36.8 C) (06/28 0735) Pulse Rate:  [91-102] 97 (06/28 0735) Resp:  [15-20] 18 (06/28 0735) BP: (144-177)/(74-82) 162/76 (06/28 0735) SpO2:  [95 %-99 %] 99 % (06/28 0735) Weight:  [86.2 kg] 86.2 kg (06/28 0310) Last BM Date: 06/29/20 General:  comfortable not in distress Head:  Normocephalic and atraumatic. Eyes:   No icterus.   Conjunctiva pink. PERRLA. Ears:  Normal auditory acuity. Neck:  Supple; no masses or thyroidomegaly Lungs: Respirations even and unlabored. Lungs clear to auscultation bilaterally.   No wheezes, crackles, or rhonchi.  Heart:  Regular rate and rhythm;  Without murmur, clicks, rubs or gallops Abdomen:  Soft, nondistended, nontender. Normal bowel sounds. No appreciable masses or hepatomegaly.  No rebound or guarding.  Neurologic:  Alert and oriented xo. Psych:  confused   LAB RESULTS: Recent Labs    06/30/20 0431 07/01/20 0742  WBC 6.8 5.0  HGB 8.3* 9.2*  HCT 25.7* 28.5*  PLT 250 259   BMET Recent Labs    06/30/20 0431 07/01/20 0747 07/02/20 0428  NA 145 150* 150*  K 3.8 4.3 3.6  CL 109 113* 114*  CO2 24 23 25   GLUCOSE 182* 152* 202*  BUN 163* 157* 136*  CREATININE 5.54* 5.47* 5.44*  CALCIUM 10.5* 10.8* 10.4*   LFT Recent Labs     07/01/20 0747  ALBUMIN 2.8*   PT/INR No results for input(s): LABPROT, INR in the last 72 hours.  STUDIES: DG Chest 1 View  Result Date: 07/01/2020 CLINICAL DATA:  Nasogastric tube placement. EXAM: CHEST  1 VIEW COMPARISON:  June 30, 2020. FINDINGS: Stable cardiomediastinal silhouette. Stable bilateral patchy airspace opacities. Distal tip of nasogastric tube is seen in expected position of distal stomach. Bony thorax is unremarkable. IMPRESSION: Distal tip  of nasogastric tube seen in expected position of distal stomach. Electronically Signed   By: Lupita Raider M.D.   On: 07/01/2020 14:10   DG Chest Port 1 View  Result Date: 06/30/2020 CLINICAL DATA:  NG tube check EXAM: PORTABLE CHEST 1 VIEW COMPARISON:  06/30/2020 FINDINGS: NG tube tip is in the midthoracic esophagus, unchanged since earlier study. Patchy airspace disease throughout the left lung. Mild cardiomegaly. IMPRESSION: NG tube tip in the midthoracic spine fetus. Patchy opacities throughout the left lung, unchanged. Electronically Signed   By: Charlett Nose M.D.   On: 06/30/2020 22:12   DG Chest Port 1 View  Result Date: 06/30/2020 CLINICAL DATA:  NG tube check EXAM: PORTABLE CHEST 1 VIEW COMPARISON:  Radiograph 06/28/2020 FINDINGS: Transesophageal tube tip terminates in the midthoracic esophagus with side port in the proximal esophagus just below the level of the thoracic inlet. Right upper extremity PICC tip terminates in the mid SVC. Telemetry leads and support devices overlie the chest. Stable cardiomediastinal contours with a calcified aorta accounting for differences in positioning. No significant interval change in the bilateral heterogeneous airspace opacities, most pronounced in the left mid lung and retrocardiac space. No pneumothorax. Suspect some layering left effusion. No visible right effusion. Degenerative changes are present in the imaged spine and shoulders. No acute or worrisome osseous abnormalities. IMPRESSION:  Transesophageal tube has been retracted and now terminates in the midthoracic esophagus and should be advanced nearly 25 cm to position the side port beyond the GE junction. Stable positioning of a right upper extremity PICC. Persistent bilateral heterogeneous airspace opacities without significant change from prior. Trace left effusion. Electronically Signed   By: Kreg Shropshire M.D.   On: 06/30/2020 22:09      Impression / Plan:   Wyatt Garbe Brinker Montez Ball. is a 59 y.o. y/o male admitted recently with hypoxic respiratory failure secondary to pneumonia requiring intubation and then self extubated.  Presently tolerating on room air.  AKI and septic shock.  Been called for evaluation of dysphagia and placement of a PEG tube.  Patient has been on NG feeds.  Patient is not getting adequate nutrition orally and nutrition is recommended a G-tube placement.  Palliative care has seen the patient and is following and the patient's family wants nutritional intake to continue to target.Discussed with brother Wyatt Ball , explained risks vs benefits of a G tube and chances of infection , perforation, hemorrhage and death . He wishes to proceed. Will place G tube tomorrow    I have discussed alternative options, risks & benefits,  which include, but are not limited to, bleeding, infection, perforation,respiratory complication & drug reaction.  The patient agrees with this plan & written consent will be obtained.     Thank you for involving me in the care of this patient.      LOS: 28 days   Wyatt Mood, MD  07/02/2020, 10:11 AM

## 2020-07-03 ENCOUNTER — Encounter
Admission: EM | Disposition: A | Discharge status: Skilled Nursing Facility | Payer: Non-veteran care | Source: Home / Self Care | Attending: Pulmonary Disease

## 2020-07-03 ENCOUNTER — Inpatient Hospital Stay: Payer: No Typology Code available for payment source | Admitting: Anesthesiology

## 2020-07-03 ENCOUNTER — Encounter: Payer: Self-pay | Admitting: Internal Medicine

## 2020-07-03 DIAGNOSIS — J189 Pneumonia, unspecified organism: Secondary | ICD-10-CM | POA: Diagnosis not present

## 2020-07-03 DIAGNOSIS — R1310 Dysphagia, unspecified: Secondary | ICD-10-CM | POA: Diagnosis not present

## 2020-07-03 DIAGNOSIS — G9341 Metabolic encephalopathy: Secondary | ICD-10-CM | POA: Diagnosis not present

## 2020-07-03 HISTORY — PX: PEG PLACEMENT: SHX5437

## 2020-07-03 LAB — BASIC METABOLIC PANEL
Anion gap: 10 (ref 5–15)
BUN: 137 mg/dL — ABNORMAL HIGH (ref 8–23)
CO2: 26 mmol/L (ref 22–32)
Calcium: 10 mg/dL (ref 8.9–10.3)
Chloride: 115 mmol/L — ABNORMAL HIGH (ref 98–111)
Creatinine, Ser: 4.88 mg/dL — ABNORMAL HIGH (ref 0.61–1.24)
GFR, Estimated: 13 mL/min — ABNORMAL LOW (ref >60)
Glucose, Bld: 134 mg/dL — ABNORMAL HIGH (ref 70–99)
Potassium: 3.8 mmol/L (ref 3.5–5.1)
Sodium: 151 mmol/L — ABNORMAL HIGH (ref 135–145)

## 2020-07-03 LAB — CULTURE, BLOOD (ROUTINE X 2)
Culture: NO GROWTH
Culture: NO GROWTH
Special Requests: ADEQUATE

## 2020-07-03 LAB — GLUCOSE, CAPILLARY
Glucose-Capillary: 106 mg/dL — ABNORMAL HIGH (ref 70–99)
Glucose-Capillary: 116 mg/dL — ABNORMAL HIGH (ref 70–99)
Glucose-Capillary: 123 mg/dL — ABNORMAL HIGH (ref 70–99)
Glucose-Capillary: 136 mg/dL — ABNORMAL HIGH (ref 70–99)
Glucose-Capillary: 143 mg/dL — ABNORMAL HIGH (ref 70–99)
Glucose-Capillary: 145 mg/dL — ABNORMAL HIGH (ref 70–99)

## 2020-07-03 SURGERY — INSERTION, PEG TUBE
Anesthesia: General

## 2020-07-03 MED ORDER — LIDOCAINE HCL (CARDIAC) PF 100 MG/5ML IV SOSY
PREFILLED_SYRINGE | INTRAVENOUS | Status: DC | PRN
  Administered 2020-07-03: 100 mg via INTRAVENOUS

## 2020-07-03 MED ORDER — FREE WATER
200.0000 mL | Status: DC
  Administered 2020-07-03 – 2020-07-05 (×8): 200 mL

## 2020-07-03 MED ORDER — GLYCOPYRROLATE 0.2 MG/ML IJ SOLN
INTRAMUSCULAR | Status: DC | PRN
  Administered 2020-07-03: .2 mg via INTRAVENOUS

## 2020-07-03 MED ORDER — PROPOFOL 10 MG/ML IV BOLUS
INTRAVENOUS | Status: DC | PRN
  Administered 2020-07-03: 30 mg via INTRAVENOUS

## 2020-07-03 MED ORDER — INSULIN GLARGINE 100 UNIT/ML ~~LOC~~ SOLN
10.0000 [IU] | Freq: Once | SUBCUTANEOUS | Status: AC
  Administered 2020-07-03: 10 [IU] via SUBCUTANEOUS
  Filled 2020-07-03: qty 0.1

## 2020-07-03 MED ORDER — PROPOFOL 500 MG/50ML IV EMUL
INTRAVENOUS | Status: DC | PRN
  Administered 2020-07-03: 145 ug/kg/min via INTRAVENOUS

## 2020-07-03 NOTE — Progress Notes (Signed)
Unable to obtain consent due to patient level of consciousness.

## 2020-07-03 NOTE — Hospital Course (Signed)
Medical records reviewed and are as summarized below:  Wyatt Ball. is a 60 y.o. male with medical history significant for hypertension, PTSD, diabetes, migraine, GERD, neurogenic bladder secondary to remote history of transverse myelitis requiring self urethral catheterization, frequent UTIs, chronic back pain on opioids, generally independent at baseline.  He usually gets his care at the [facility] hospital.  He was brought to the emergency room because of altered mental status, cough, shortness of breath, fever, vomiting and diarrhea.   He was admitted to the hospital for sepsis secondary to pneumonia.  He was transferred to the ICU for septic shock, AKI, COPD exacerbation.  He was intubated and placed on mechanical ventilation.  He was treated with empiric IV antibiotics including IV fluconazole for yeast in sputum, IV fluids.  NG tube was placed for enteral nutrition and medications because of severe dysphagia.  Hemodialysis was discontinued because he is not a candidate for long-term hemodialysis per nephrologist.   5/21 admitted for sepsis and pneumonia. 6/2 transferred to ICU, intubated underwent bronchoscopy 6/3 self extubated followed by reintubated 6/7 HD started 6/15 extubated 6/19 transferred to [facility]. Neurology, nephrology, urology and infectious disease consulted.     After transfer to progressive care unit, he developed fever and tachycardia that was concerning for sepsis on 06/28/2020.  He was restarted on IV antibiotics (meropenem and vancomycin).  Repeat blood culture did not show any growth.  Gastroenterologist was consulted for PEG tube placement per family's request.

## 2020-07-03 NOTE — Anesthesia Postprocedure Evaluation (Signed)
Anesthesia Post Note  Patient: Wyatt Ball.  Procedure(s) Performed: PERCUTANEOUS ENDOSCOPIC GASTROSTOMY (PEG) PLACEMENT  Patient location during evaluation: Endoscopy Anesthesia Type: General Level of consciousness: awake Pain management: pain level controlled Vital Signs Assessment: post-procedure vital signs reviewed and stable Respiratory status: spontaneous breathing, nonlabored ventilation, respiratory function stable and patient connected to nasal cannula oxygen Cardiovascular status: blood pressure returned to baseline and stable Postop Assessment: no apparent nausea or vomiting Anesthetic complications: no   No notable events documented.   Last Vitals:  Vitals:   07/03/20 1129 07/03/20 1408  BP: (!) 163/79 123/70  Pulse: 90 (!) 104  Resp: 20   Temp: (!) 36.1 C (!) 35.6 C  SpO2: 98% 100%    Last Pain:  Vitals:   07/03/20 1438  TempSrc:   PainSc: 0-No pain                 Cleda Mccreedy Tyronne Blann

## 2020-07-03 NOTE — Anesthesia Preprocedure Evaluation (Addendum)
Anesthesia Evaluation  Patient identified by MRN, date of birth, ID band Patient confused    Reviewed: Allergy & Precautions, NPO status , Patient's Chart, lab work & pertinent test results  History of Anesthesia Complications Negative for: history of anesthetic complications  Airway Mallampati: III  TM Distance: >3 FB Neck ROM: full    Dental  (+) Chipped   Pulmonary Current Smoker,    Pulmonary exam normal        Cardiovascular hypertension, negative cardio ROS Normal cardiovascular exam     Neuro/Psych  Headaches, negative neurological ROS  negative psych ROS   GI/Hepatic negative GI ROS, Neg liver ROS,   Endo/Other  negative endocrine ROSdiabetes  Renal/GU Renal diseasenegative Renal ROS  negative genitourinary   Musculoskeletal   Abdominal   Peds  Hematology negative hematology ROS (+)   Anesthesia Other Findings Past Medical History: No date: Anxiety No date: DM (diabetes mellitus) (HCC) No date: HTN (hypertension) No date: Transverse myelitis (HCC)  Past Surgical History: No date: TONSILLECTOMY No date: TOTAL HIP ARTHROPLASTY  BMI    Body Mass Index: 30.69 kg/m      Reproductive/Obstetrics negative OB ROS                             Anesthesia Physical Anesthesia Plan  ASA: 3  Anesthesia Plan: General   Post-op Pain Management:    Induction: Intravenous  PONV Risk Score and Plan: Propofol infusion and TIVA  Airway Management Planned: Natural Airway and Nasal Cannula  Additional Equipment:   Intra-op Plan:   Post-operative Plan:   Informed Consent: I have reviewed the patients History and Physical, chart, labs and discussed the procedure including the risks, benefits and alternatives for the proposed anesthesia with the patient or authorized representative who has indicated his/her understanding and acceptance.   Patient has DNR.  Discussed DNR with  power of attorney and Suspend DNR.   Dental Advisory Given  Plan Discussed with: Anesthesiologist, CRNA and Surgeon  Anesthesia Plan Comments: (History and phone consent from the patients brother [name] at  [phone]   Brother consented for risks of anesthesia including but not limited to:  - adverse reactions to medications - risk of airway placement if required - damage to eyes, teeth, lips or other oral mucosa - nerve damage due to positioning  - sore throat or hoarseness - Damage to heart, brain, nerves, lungs, other parts of body or loss of life  He voiced understanding.)        Anesthesia Quick Evaluation

## 2020-07-03 NOTE — Transfer of Care (Signed)
Immediate Anesthesia Transfer of Care Note  Patient: Wyatt Ball.  Procedure(s) Performed: PERCUTANEOUS ENDOSCOPIC GASTROSTOMY (PEG) PLACEMENT  Patient Location: Endoscopy Unit  Anesthesia Type:General  Level of Consciousness: drowsy and patient cooperative  Airway & Oxygen Therapy: Patient Spontanous Breathing and Patient connected to face mask oxygen  Post-op Assessment: Report given to RN and Post -op Vital signs reviewed and stable  Post vital signs: Reviewed and stable  Last Vitals:  Vitals Value Taken Time  BP    Temp    Pulse    Resp 25 07/03/20 1407  SpO2    Vitals shown include unvalidated device data.  Last Pain:  Vitals:   07/03/20 1129  TempSrc: Temporal  PainSc:          Complications: No notable events documented.

## 2020-07-03 NOTE — H&P (Signed)
[name], MD [address], [place], [place], [address] [address], [address], [place], [place], [address] Phone: [phone]  Fax: [phone]  Primary Care Physician:  System, Provider Not In   Pre-Procedure History & Physical: HPI:  Wyatt Ball. is a 60 y.o. male is here for an endoscopy    Past Medical History:  Diagnosis Date   Anxiety    DM (diabetes mellitus) (HCC)    HTN (hypertension)    Transverse myelitis (HCC)     Past Surgical History:  Procedure Laterality Date   TONSILLECTOMY     TOTAL HIP ARTHROPLASTY      Prior to Admission medications   Medication Sig Start Date End Date Taking? Authorizing Provider  Alogliptin Benzoate 12.5 MG TABS Take 12.5 mg by mouth daily.   Yes [provider]  aspirin 81 MG chewable tablet Chew 81 mg by mouth daily.   Yes [provider]  cyclobenzaprine (FLEXERIL) 10 MG tablet Take 10 mg by mouth 3 (three) times daily as needed for muscle spasms.   Yes [provider]  Docusate Sodium (DSS) 100 MG CAPS Take 200 mg by mouth 2 (two) times daily.   Yes [provider]  DULoxetine (CYMBALTA) 60 MG capsule Take 120 mg by mouth daily.   Yes [provider]  gabapentin (NEURONTIN) 600 MG tablet Take 1,200 mg by mouth 3 (three) times daily.   Yes [provider]  guaiFENesin (MUCINEX) 600 MG 12 hr tablet Take 600 mg by mouth 2 (two) times daily.   Yes [provider]  hydrOXYzine (ATARAX/VISTARIL) 50 MG tablet Take 50 mg by mouth every 6 (six) hours as needed for anxiety.   Yes [provider]  ibuprofen (ADVIL) 800 MG tablet Take 800 mg by mouth every 8 (eight) hours as needed for mild pain.   Yes [provider]  latanoprost (XALATAN) 0.005 % ophthalmic solution Place 1 drop into both eyes at bedtime.   Yes [provider]  lisinopril (ZESTRIL) 40 MG tablet Take 20 mg by mouth 2 (two) times daily.   Yes [provider]  loratadine (CLARITIN) 10 MG tablet Take 10 mg by mouth daily.   Yes [provider]  metFORMIN (GLUCOPHAGE) 1000 MG tablet Take 1,000 mg by mouth 2 (two) times daily.   Yes [provider]  metoprolol (TOPROL-XL) 200 MG 24 hr tablet Take 200 mg by mouth daily.   Yes [provider]  morphine (MS CONTIN) 15 MG 12 hr tablet Take 15 mg by mouth 2 (two) times daily.   Yes [provider]  omeprazole (PRILOSEC) 20 MG capsule Take 20 mg by mouth 2 (two) times daily.   Yes [provider]  oxyCODONE-acetaminophen (PERCOCET/ROXICET) 5-325 MG tablet Take 1 tablet by mouth 2 (two) times daily as needed for breakthrough pain.   Yes [provider]  prazosin (MINIPRESS) 1 MG capsule Take 1 mg by mouth 2 (two) times daily as needed (PTSD symptoms).   Yes [provider]  prazosin (MINIPRESS) 2 MG capsule Take 4 mg by mouth at bedtime.   Yes [provider]  promethazine (PHENERGAN) 25 MG tablet Take 25 mg by mouth 3 (three) times daily as needed for nausea.   Yes [provider]  sildenafil (VIAGRA) 100 MG tablet Take 50 mg by mouth daily as needed for erectile dysfunction.   Yes [provider]  simvastatin (ZOCOR) 80 MG tablet Take 80 mg  by mouth every evening.   Yes [provider]  traZODone (DESYREL) 150 MG tablet Take 150 mg by mouth at bedtime.   Yes [provider]    Allergies as of 06/04/2020   (No Known Allergies)    Family History  Problem Relation Age of Onset   CAD Mother    Diabetes Mother    CAD Father    Diabetes Father    CAD Brother    Diabetes Brother     Social History   Socioeconomic History   Marital status: Single    Spouse name: Not on file   Number of children: Not on file   Years of education: Not on file   Highest education level: Not on file  Occupational History   Not on file  Tobacco Use   Smoking status: Every Day    Pack years: 0.00    Smokeless tobacco: Never  Substance and Sexual Activity   Alcohol use: Never   Drug use: Not on file   Sexual activity: Not on file  Other Topics Concern   Not on file  Social History Narrative   Not on file   Social Determinants of Health   Financial Resource Strain: Not on file  Food Insecurity: Not on file  Transportation Needs: Not on file  Physical Activity: Not on file  Stress: Not on file  Social Connections: Not on file  Intimate Partner Violence: Not on file    Review of Systems: See HPI, otherwise negative ROS  Physical Exam: BP (!) 163/79   Pulse 90   Temp (!) 96.9 F (36.1 C) (Temporal)   Resp 20   Ht 5' 5.98" (1.676 m)   Wt 86.2 kg Comment: last entry incorrect  SpO2 98%   BMI 30.69 kg/m  General:   Alert,  pleasant and cooperative in NAD Head:  Normocephalic and atraumatic. Neck:  Supple; no masses or thyromegaly. Lungs:  Clear throughout to auscultation, normal respiratory effort.    Heart:  +S1, +S2, Regular rate and rhythm, No edema. Abdomen:  Soft, nontender and nondistended. Normal bowel sounds, without guarding, and without rebound.   Neurologic:  Alert and  oriented x0;  g  Impression/Plan: Gaspar Garbe [employer]. is here for an endoscopy  to be performed for  evaluation of dysphagia and placement of a G tube    Risks, benefits, limitations, and alternatives regarding endoscopy have been reviewed with the patient's brother LEO.  Questions have been answered.  All parties agreeable.   [name], MD  07/03/2020, 1:20 PM

## 2020-07-03 NOTE — TOC Progression Note (Signed)
Transition of Care (TOC) - Progression Note    Patient Details  Name: Wyatt Ball. MRN: [id number] Date of Birth: 07-11-1960  Transition of Care [facility]) CM/SW Contact  Caryn Section, RN Phone Number: 07/03/2020, 9:25 AM  Clinical Narrative:   Spoke with patient's brother, Wyatt Ball.  Wyatt Ball states that care team has just called him to proceed with PEG Tube.  Brother also states that although he is hopeful that patient will "get better", he is aware that this may not happen.  He states that he does not want his brother to starve to death, but understands that other interventions may or may not be appropriate.  He will handle these possibilities as they are presented.  He would like his brother to be comfortable and understands that he will need SNF placement upon hospital discharge.  RNCM presented brother with [facility] Caregiver support information [phone] to assist brother with healthcare needs.  Wyatt Ball was receptive to this information and will seek assistance.  RNCM also discussed resources for consultation, like pastoral care to assist with coping and the taxing emotional decisions.  Wyatt Ball was again receptive to these suggestions and states he will seek assistance if and when he feels appropriate.  Patient will undergo PEG placement today.  TOC contact information given to Wyatt Ball [facility] will follow through discharge.    Expected Discharge Plan: Skilled Nursing Facility Barriers to Discharge: Continued Medical Work up  Expected Discharge Plan and Services Expected Discharge Plan: Skilled Nursing Facility In-house Referral: NA   Post Acute Care Choice: Skilled Nursing Facility Living arrangements for the past 2 months: Single Family Home                                       Social Determinants of Health (SDOH) Interventions    Readmission Risk Interventions No flowsheet data found.

## 2020-07-03 NOTE — Progress Notes (Signed)
PROGRESS NOTE    Wyatt Ball.   JHE:[id number]  DOB: [birth year]  PCP: System, Provider Not In    DOA: 06/04/2020 LOS: 29   Assessment & Plan   Principal Problem:   CAP (community acquired pneumonia) Active Problems:   Acute metabolic encephalopathy   Severe sepsis with septic shock ([place])   Neurogenic bladder   Chronic, continuous use of opioids   Chronic low back pain   Type 2 diabetes mellitus without complication (HCC)   Migraines   Recurrent UTI   History of colon polyps   Urinary retention   Altered mental status   Pressure injury of skin   AKI (acute kidney injury) ([place])   Septic shock secondary to community-acquired pneumonia: Status post 8 days azithromycin, 3 days of cephalosporin plus 5 days of IV meropenem followed by 7 days of Levaquin and doxycycline.   S/p bronchoscopy blood cultures were negative.   No growth on blood cultures . Monitor closely.   New fever and tachycardia on 06/28/2020, new sepsis secondary to bilateral pneumonia: MRSA screen was negative.  No growth on repeat blood cultures.  He was treated with IV vancomycin from 06/28/2020 through 06/30/2020 and IV meropenem from 06/28/2020 through 07/01/2020.  Urine culture from 06/28/2020 showed yeast.  Of note, patient had previously been treated with IV fluconazole from 06/25/2020 through 06/30/2020.   Acute hypoxic respiratory failure: Due to PNA. Resolved.  S/p intubation and extubation.     AKI, hyperphosphatemia: stable, Cr remains up.  Good UO.  Hemodialysis was initiated on 06/10/2020, now off and HD catheter removed. No plan for further hemodialysis.  He is not a good candidate for long-term dialysis per nephrologist.     Hypernatremia: Continue IV 5% dextrose infusion for hypernatremia.  Monitor BMP.  Will start free water flushes per G tube tomorrow.   Acute metabolic encephalopathy, agitation, history of PTSD: Patient has been evaluated by neurologist.  EEG was unremarkable.   --Continue  psychotropics and prazosin   Dysphagia: Transition enteral nutrition via new G tube tomorrow per GI.  Hypertension: Continue antihypertensives   Stage II coccygeal decubitus ulcer: Present on admission: Continue foam dressing. Pressure Injury 06/09/20 Coccyx Mid Deep Tissue Pressure Injury - Purple or maroon localized area of discolored intact skin or blood-filled blister due to damage of underlying soft tissue from pressure and/or shear. (Active)  06/09/20 2014  Location: Coccyx  Location Orientation: Mid  Staging: Deep Tissue Pressure Injury - Purple or maroon localized area of discolored intact skin or blood-filled blister due to damage of underlying soft tissue from pressure and/or shear.  Wound Description (Comments):   Present on Admission: No     Pressure Injury 06/23/20 Coccyx Medial Unstageable - Full thickness tissue loss in which the base of the injury is covered by slough (yellow, tan, gray, green or brown) and/or eschar (tan, brown or black) in the wound bed. (Active)  06/23/20 1400  Location: Coccyx  Location Orientation: Medial  Staging: Unstageable - Full thickness tissue loss in which the base of the injury is covered by slough (yellow, tan, gray, green or brown) and/or eschar (tan, brown or black) in the wound bed.  Wound Description (Comments):   Present on Admission: Yes (on admission to 2a from icu)     Pressure Injury 06/23/20 Coccyx Posterior;Mid Stage 2 -  Partial thickness loss of dermis presenting as a shallow open injury with a red, pink wound bed without slough. (Active)  06/23/20 1400  Location: Coccyx  Location Orientation:  Posterior;Mid  Staging: Stage 2 -  Partial thickness loss of dermis presenting as a shallow open injury with a red, pink wound bed without slough.  Wound Description (Comments):   Present on Admission: Yes         Hyperkalemia: Resolved   Neurogenic bladder due to hx of transverse myelitis  Chronic pain syndrome on opioids     Inadequate PO intake - inability to eat due to acute prolonged illness as above. --G tube placed 07/03/20 --NPO status  Obesity: Body mass index is 30.69 kg/m.  Complicates overall care and prognosis.  Recommend lifestyle modifications including physical activity and diet for weight loss and overall long-term health.   DVT prophylaxis: heparin injection 5,000 Units Start: 06/10/20 2200   Diet:     Code Status: DNR   Brief Narrative / Hospital Course to Date:   Medical records reviewed and are as summarized below:  Wyatt Ball. is a 60 y.o. male with medical history significant for hypertension, PTSD, diabetes, migraine, GERD, neurogenic bladder secondary to remote history of transverse myelitis requiring self urethral catheterization, frequent UTIs, chronic back pain on opioids, generally independent at baseline.  He usually gets his care at the [facility] hospital.  He was brought to the emergency room because of altered mental status, cough, shortness of breath, fever, vomiting and diarrhea.   He was admitted to the hospital for sepsis secondary to pneumonia.  He was transferred to the ICU for septic shock, AKI, COPD exacerbation.  He was intubated and placed on mechanical ventilation.  He was treated with empiric IV antibiotics including IV fluconazole for yeast in sputum, IV fluids.  NG tube was placed for enteral nutrition and medications because of severe dysphagia.  Hemodialysis was discontinued because he is not a candidate for long-term hemodialysis per nephrologist.   5/21 admitted for sepsis and pneumonia. 6/2 transferred to ICU, intubated underwent bronchoscopy 6/3 self extubated followed by reintubated 6/7 HD started 6/15 extubated 6/19 transferred to [facility]. Neurology, nephrology, urology and infectious disease consulted.     After transfer to progressive care unit, he developed fever and tachycardia that was concerning for sepsis on 06/28/2020.  He was restarted on IV  antibiotics (meropenem and vancomycin).  Repeat blood culture did not show any growth.  Gastroenterologist was consulted for PEG tube placement per family's request.    Subjective 07/03/20    Pt went for G tube placement with GI today.  No acute events reports.  Denies pain or other acute complaints.  Remains a bit sedated.  Disposition Plan & Communication   Status is: Inpatient  Remains inpatient appropriate because:Inpatient level of care appropriate due to severity of illness  Dispo: The patient is from: Home              Anticipated d/c is to: SNF vs LTAC              Patient currently is not medically stable to d/c.   Difficult to place patient No   Consults, Procedures, Significant Events   Consultants:  Gastroenterology Palliative care Nephrology  Procedures:  As above  Antimicrobials:  Anti-infectives (From admission, onward)    Start     Dose/Rate Route Frequency Ordered Stop   06/28/20 1815  vancomycin (VANCOREADY) IVPB 1750 mg/350 mL        1,750 mg 175 mL/hr over 120 Minutes Intravenous  Once 06/28/20 1726 06/28/20 2054   06/28/20 1815  meropenem (MERREM) 1 g in sodium chloride 0.9 % 100  mL IVPB  Status:  Discontinued        1 g 200 mL/hr over 30 Minutes Intravenous Every 12 hours 06/28/20 1726 07/01/20 1558   06/28/20 1742  vancomycin variable dose per unstable renal function (pharmacist dosing)  Status:  Discontinued         Does not apply See admin instructions 06/28/20 1742 06/30/20 0757   06/26/20 2100  fluconazole (DIFLUCAN) IVPB 50 mg       See Hyperspace for full Linked Orders Report.   50 mg 25 mL/hr over 60 Minutes Intravenous Every 24 hours 06/25/20 1835 06/30/20 2118   06/25/20 2000  fluconazole (DIFLUCAN) IVPB 200 mg       See Hyperspace for full Linked Orders Report.   200 mg 100 mL/hr over 60 Minutes Intravenous  Once 06/25/20 1835 06/25/20 2348   06/14/20 1800  levofloxacin (LEVAQUIN) IVPB 500 mg        500 mg 100 mL/hr over 60 Minutes  Intravenous Every 48 hours 06/12/20 1624 06/19/20 0210   06/12/20 1800  levofloxacin (LEVAQUIN) IVPB 750 mg        750 mg 100 mL/hr over 90 Minutes Intravenous  Once 06/11/20 2239 06/12/20 1904   06/11/20 1800  meropenem (MERREM) 500 mg in sodium chloride 0.9 % 100 mL IVPB  Status:  Discontinued        500 mg 200 mL/hr over 30 Minutes Intravenous Every 24 hours 06/10/20 1444 06/11/20 2231   06/10/20 1300  doxycycline (VIBRAMYCIN) 100 mg in sodium chloride 0.9 % 250 mL IVPB  Status:  Discontinued        100 mg 125 mL/hr over 120 Minutes Intravenous Every 12 hours 06/10/20 1149 06/17/20 1704   06/09/20 1015  meropenem (MERREM) 500 mg in sodium chloride 0.9 % 100 mL IVPB        500 mg 200 mL/hr over 30 Minutes Intravenous Every 12 hours 06/09/20 0922 06/11/20 1203   06/08/20 2200  meropenem (MERREM) 1 g in sodium chloride 0.9 % 100 mL IVPB  Status:  Discontinued        1 g 200 mL/hr over 30 Minutes Intravenous Every 12 hours 06/08/20 0845 06/09/20 0922   06/07/20 2200  azithromycin (ZITHROMAX) 500 mg in sodium chloride 0.9 % 250 mL IVPB        500 mg 250 mL/hr over 60 Minutes Intravenous Every 24 hours 06/07/20 1441 06/11/20 2211   06/07/20 1600  ceFEPIme (MAXIPIME) 2 g in sodium chloride 0.9 % 100 mL IVPB  Status:  Discontinued        2 g 200 mL/hr over 30 Minutes Intravenous Every 8 hours 06/07/20 1441 06/07/20 1442   06/07/20 1600  meropenem (MERREM) 1 g in sodium chloride 0.9 % 100 mL IVPB  Status:  Discontinued        1 g 200 mL/hr over 30 Minutes Intravenous Every 8 hours 06/07/20 1442 06/08/20 0845   06/06/20 1630  vancomycin (VANCOREADY) IVPB 1500 mg/300 mL  Status:  Discontinued        1,500 mg 150 mL/hr over 120 Minutes Intravenous  Once 06/06/20 1533 06/06/20 1632   06/05/20 0600  cefTRIAXone (ROCEPHIN) 2 g in sodium chloride 0.9 % 100 mL IVPB  Status:  Discontinued        2 g 200 mL/hr over 30 Minutes Intravenous Every 24 hours 06/04/20 2029 06/07/20 1439   06/04/20 2200   azithromycin (ZITHROMAX) 500 mg in sodium chloride 0.9 % 250 mL IVPB  500 mg 250 mL/hr over 60 Minutes Intravenous Every 24 hours 06/04/20 2029 06/07/20 0003   06/04/20 1915  ceFEPIme (MAXIPIME) 2 g in sodium chloride 0.9 % 100 mL IVPB        2 g 200 mL/hr over 30 Minutes Intravenous  Once 06/04/20 1906 06/04/20 2055   06/04/20 1915  metroNIDAZOLE (FLAGYL) IVPB 500 mg        500 mg 100 mL/hr over 60 Minutes Intravenous  Once 06/04/20 1906 06/04/20 2055   06/04/20 1915  vancomycin (VANCOCIN) IVPB 1000 mg/200 mL premix  Status:  Discontinued        1,000 mg 200 mL/hr over 60 Minutes Intravenous  Once 06/04/20 1906 06/04/20 1912   06/04/20 1915  vancomycin (VANCOREADY) IVPB 1750 mg/350 mL        1,750 mg 175 mL/hr over 120 Minutes Intravenous  Once 06/04/20 1912 06/04/20 2257         Micro    Objective   Vitals:   07/03/20 0825 07/03/20 1129 07/03/20 1408 07/03/20 1635  BP: (!) 142/71 (!) 163/79 123/70 (!) 164/88  Pulse: 84 90 (!) 104 99  Resp: _0 Temp: 98.1 F (36.7 C) (!) 96.9 F (36.1 C) (!) 96 F (35.6 C) 98.5 F (36.9 C)  TempSrc:  Temporal Temporal   SpO2: 100% 98% 100% 98%  Weight:      Height:        Intake/Output Summary (Last 24 hours) at 07/03/2020 1649 Last data filed at 07/03/2020 1355 Gross per 24 hour  Intake 1267.9 ml  Output 2150 ml  Net -882.1 ml   Filed Weights   06/30/20 0500 07/01/20 0500 07/02/20 0310  Weight: 89.4 kg 60.4 kg 86.2 kg    Physical Exam:  General exam: Sedated but responsive, no acute distress, obese Respiratory system: Decreased breath sounds, normal respiratory effort. Cardiovascular system: normal S1/S2, RRR   Gastrointestinal system: soft, NT, ND G-tube present. Central nervous system: Grossly nonfocal exam although patient currently unable to participate in comprehensive exam Extremities: No cyanosis, normal tone Skin: dry, intact, normal temperature  Labs   Data Reviewed: I have personally reviewed  following labs and imaging studies  CBC: Recent Labs  Lab 06/27/20 0630 06/28/20 0624 06/29/20 0500 06/30/20 0431 07/01/20 0742  WBC 7.0 6.3 10.1 6.8 5.0  NEUTROABS 5.0 4.8 8.2* 5.0 3.3  HGB 9.0* 8.9* 8.3* 8.3* 9.2*  HCT 27.1* 27.2* 25.8* 25.7* 28.5*  MCV 90.9 91.0 92.1 92.1 90.2  PLT 230 236 257 250 419   Basic Metabolic Panel: Recent Labs  Lab 06/27/20 0630 06/28/20 0624 06/29/20 0500 06/30/20 0431 07/01/20 0742 07/01/20 0747 07/02/20 0428 07/03/20 0530  NA 144  --  145 145  --  150* 150* 151*  K 4.1  --  4.0 3.8  --  4.3 3.6 3.8  CL 105  --  109 109  --  113* 114* 115*  CO2 28  --  23 24  --  _1 GLUCOSE 195*  --  153* 182*  --  152* 202* 134*  BUN 100*  --  164* 163*  --  157* 136* 137*  CREATININE 5.41*  --  6.09* 5.54*  --  5.47* 5.44* 4.88*  CALCIUM 10.5*  --  10.0 10.5*  --  10.8* 10.4* 10.0  MG 2.8* 2.9* 2.9* 3.0* 3.0*  --   --   --   PHOS 8.6* 8.5* 7.7* 7.8*  --  7.1*  --   --  GFR: Estimated Creatinine Clearance: 16.4 mL/min (A) (by C-G formula based on SCr of 4.88 mg/dL (H)). Liver Function Tests: Recent Labs  Lab 07/01/20 0747  ALBUMIN 2.8*   No results for input(s): LIPASE, AMYLASE in the last 168 hours. No results for input(s): AMMONIA in the last 168 hours. Coagulation Profile: No results for input(s): INR, PROTIME in the last 168 hours. Cardiac Enzymes: No results for input(s): CKTOTAL, CKMB, CKMBINDEX, TROPONINI in the last 168 hours. BNP (last 3 results) No results for input(s): PROBNP in the last 8760 hours. HbA1C: No results for input(s): HGBA1C in the last 72 hours. CBG: Recent Labs  Lab 07/02/20 2048 07/03/20 0003 07/03/20 0414 07/03/20 0841 07/03/20 1631  GLUCAP 159* 145* 143* 116* 136*   Lipid Profile: No results for input(s): CHOL, HDL, LDLCALC, TRIG, CHOLHDL, LDLDIRECT in the last 72 hours. Thyroid Function Tests: No results for input(s): TSH, T4TOTAL, FREET4, T3FREE, THYROIDAB in the last 72 hours. Anemia  Panel: No results for input(s): VITAMINB12, FOLATE, FERRITIN, TIBC, IRON, RETICCTPCT in the last 72 hours. Sepsis Labs: Recent Labs  Lab 06/28/20 1705 06/28/20 2130 06/29/20 0500 06/30/20 0431  PROCALCITON 0.34  --  1.28 1.04  LATICACIDVEN 1.6 0.4*  --   --     Recent Results (from the past 240 hour(s))  Urine Culture     Status: Abnormal   Collection Time: 06/28/20  4:13 PM   Specimen: Urine, Random  Result Value Ref Range Status   Specimen Description URINE, RANDOM  Final   Special Requests NONE  Final   Culture >=100,000 COLONIES/mL YEAST (A)  Final   Report Status 07/01/2020 FINAL  Final  CULTURE, BLOOD (ROUTINE X 2) w Reflex to ID Panel     Status: None   Collection Time: 06/28/20  5:05 PM   Specimen: BLOOD  Result Value Ref Range Status   Specimen Description BLOOD RIGHT HAND  Final   Special Requests   Final    BOTTLES DRAWN AEROBIC AND ANAEROBIC Blood Culture results may not be optimal due to an excessive volume of blood received in culture bottles   Culture   Final    NO GROWTH 5 DAYS Performed at [facility], [address] [place], [place] [address]    Report Status 07/03/2020 FINAL  Final  CULTURE, BLOOD (ROUTINE X 2) w Reflex to ID Panel     Status: None   Collection Time: 06/28/20  5:05 PM   Specimen: BLOOD  Result Value Ref Range Status   Specimen Description BLOOD LEFT ANTECUBITAL  Final   Special Requests   Final    BOTTLES DRAWN AEROBIC AND ANAEROBIC Blood Culture adequate volume   Culture   Final    NO GROWTH 5 DAYS Performed at [facility], [address] [place], [place] [address]    Report Status 07/03/2020 FINAL  Final  MRSA Next Gen by PCR, Nasal     Status: None   Collection Time: 06/29/20  5:10 PM  Result Value Ref Range Status   MRSA by PCR Next Gen NOT DETECTED NOT DETECTED Final    Comment: (NOTE) The GeneXpert MRSA Assay (FDA approved for NASAL specimens only), is one component of a comprehensive MRSA  colonization surveillance program. It is not intended to diagnose MRSA infection nor to guide or monitor treatment for MRSA infections. Test performance is not FDA approved in patients less than 69 years old. Performed at [facility], [address] [place], [place] [address]  Imaging Studies   No results found.   Medications   Scheduled Meds:  amLODipine  5 mg Per Tube Daily   chlorhexidine gluconate (MEDLINE KIT)  15 mL Mouth Rinse BID   Chlorhexidine Gluconate Cloth  6 each Topical Daily   cloNIDine  0.3 mg Transdermal Weekly   feeding supplement (PROSource TF)  45 mL Per Tube Daily   free water  100 mL Per Tube Q4H   gabapentin  100 mg Per Tube Q8H   heparin injection (subcutaneous)  5,000 Units Subcutaneous Q8H   insulin aspart  0-20 Units Subcutaneous Q4H   insulin aspart  3 Units Subcutaneous Q4H   insulin glargine  20 Units Subcutaneous Daily   metoprolol tartrate  25 mg Per Tube BID   multivitamin  1 tablet Per Tube QHS   nutrition supplement (JUVEN)  1 packet Per Tube BID   pantoprazole (PROTONIX) IV  40 mg Intravenous Q24H   prazosin  4 mg Per Tube QHS   QUEtiapine  12.5 mg Per Tube BID   sodium chloride flush  10-40 mL Intracatheter Q12H   traZODone  150 mg Per Tube QHS   Continuous Infusions:  sodium chloride Stopped (06/22/20 1614)   feeding supplement (NEPRO CARB STEADY) Stopped (07/03/20 0013)       LOS: 29 days    Time spent: 30 minutes    Ezekiel Slocumb, DO [facility]  07/03/2020, 4:49 PM      If 7PM-7AM, please contact night-coverage. How to contact the [facility] Attending or Consulting provider [place] or covering provider during after hours [place], for this patient?    Check the care team in [facility] and look for a) attending/consulting [facility] provider listed and b) the [facility] team listed Log into [email or web address] and use [place]'s universal password to access. If you do not have the password, please contact the hospital  operator. Locate the [facility] provider you are looking for under [facility] and page to a number that you can be directly reached. If you still have difficulty reaching the provider, please page the [facility] (Director on Call) for the Hospitalists listed on amion for assistance.

## 2020-07-03 NOTE — Op Note (Signed)
[facility] Patient Name: Wyatt Ball Procedure Date: 07/03/2020 1:24 PM MRN: [id number] Account #: [id number] Date of Birth: Jul 11, 1960 Admit Type: Outpatient Age: 60 Room: [facility] ENDO ROOM 3 Gender: Male Note Status: Finalized Procedure:             Upper GI endoscopy Indications:           Place PEG due to impaired swallowing Providers:             [name] MD, MD Medicines:             Monitored Anesthesia Care Complications:         No immediate complications. Procedure:             Pre-Anesthesia Assessment:                        - Prior to the procedure, a History and Physical was                         performed, and patient medications, allergies and                         sensitivities were reviewed. The patient's tolerance                         of previous anesthesia was reviewed.                        - The risks and benefits of the procedure and the                         sedation options and risks were discussed with the                         patient. All questions were answered and informed                         consent was obtained.                        - ASA Grade Assessment: III - A patient with severe                         systemic disease.                        After obtaining informed consent, the endoscope was                         passed under direct vision. Throughout the procedure,                         the patient's blood pressure, pulse, and oxygen                         saturations were monitored continuously. The Endoscope                         was introduced through the mouth, and advanced to the  third part of duodenum. The upper GI endoscopy was                         accomplished with ease. The patient tolerated the                         procedure well. Findings:      The esophagus was normal.      The examined duodenum was normal.      The stomach was  normal.      The entire examined stomach was normal. Placement of an externally       removable PEG with no T-fasteners was successfully completed. The       external bumper was at the 4.0 cm marking on the tube. Impression:            - Normal esophagus.                        - Normal examined duodenum.                        - Normal stomach.                        - Normal stomach.                        - An externally removable PEG placement was                         successfully completed.                        - No specimens collected. Recommendation:        - Please follow the post-PEG recommendations                         including: external bolster snug to abdominal wall,                         change dressing once per day, NPO x4 hrs then water                         today and check site for bleeding q 4 hrs. Procedure Code(s):     --- Professional ---                        [phone], Esophagogastroduodenoscopy, flexible,                         transoral; with directed placement of percutaneous                         gastrostomy tube Diagnosis Code(s):     --- Professional ---                        R13.10, Dysphagia, unspecified                        Z43.1, Encounter for attention to gastrostomy CPT copyright 2019 [employer]. All rights reserved. The codes documented in  this report are preliminary and upon coder review may  be revised to meet current compliance requirements. [name], MD [name] MD, MD 07/03/2020 2:04:45 PM This report has been signed electronically. Number of Addenda: 0 Note Initiated On: 07/03/2020 1:24 PM Estimated Blood Loss:  Estimated blood loss: none.      [facility]

## 2020-07-03 NOTE — Progress Notes (Signed)
     Chart Reviewed. Updates Received.   Patient recently left unit to have endoscopy/PEG tube placement via GI. No family is at the bedside.   Patient continues to be much more awake and alert over the past 2 days.   PMT will continue to support and follow as needed. Goals previously discussed with patient's brother, [name] and are clear to continue to treat the treatable allowing patient every opportunity to show some improvement. He plans to take things day by day and make any needed decisions when needed based on situation.   Open to outpatient palliative support.   Willette Alma, AGPCNP-BC Palliative Medicine Team  Phone: [phone] Pager: [phone] Amion: [place]. [place]   NO CHARGE

## 2020-07-04 ENCOUNTER — Encounter: Payer: Self-pay | Admitting: Gastroenterology

## 2020-07-04 DIAGNOSIS — Z931 Gastrostomy status: Secondary | ICD-10-CM | POA: Diagnosis not present

## 2020-07-04 DIAGNOSIS — E119 Type 2 diabetes mellitus without complications: Secondary | ICD-10-CM | POA: Diagnosis not present

## 2020-07-04 DIAGNOSIS — J9601 Acute respiratory failure with hypoxia: Secondary | ICD-10-CM | POA: Diagnosis not present

## 2020-07-04 DIAGNOSIS — J189 Pneumonia, unspecified organism: Secondary | ICD-10-CM | POA: Diagnosis not present

## 2020-07-04 DIAGNOSIS — M545 Low back pain, unspecified: Secondary | ICD-10-CM | POA: Diagnosis not present

## 2020-07-04 DIAGNOSIS — G9341 Metabolic encephalopathy: Secondary | ICD-10-CM | POA: Diagnosis not present

## 2020-07-04 DIAGNOSIS — N319 Neuromuscular dysfunction of bladder, unspecified: Secondary | ICD-10-CM | POA: Diagnosis not present

## 2020-07-04 LAB — BASIC METABOLIC PANEL
Anion gap: 8 (ref 5–15)
BUN: 136 mg/dL — ABNORMAL HIGH (ref 8–23)
CO2: 25 mmol/L (ref 22–32)
Calcium: 9.8 mg/dL (ref 8.9–10.3)
Chloride: 118 mmol/L — ABNORMAL HIGH (ref 98–111)
Creatinine, Ser: 4.52 mg/dL — ABNORMAL HIGH (ref 0.61–1.24)
GFR, Estimated: 14 mL/min — ABNORMAL LOW (ref >60)
Glucose, Bld: 137 mg/dL — ABNORMAL HIGH (ref 70–99)
Potassium: 3.7 mmol/L (ref 3.5–5.1)
Sodium: 151 mmol/L — ABNORMAL HIGH (ref 135–145)

## 2020-07-04 LAB — GLUCOSE, CAPILLARY
Glucose-Capillary: 127 mg/dL — ABNORMAL HIGH (ref 70–99)
Glucose-Capillary: 98 mg/dL (ref 70–99)
Glucose-Capillary: 144 mg/dL — ABNORMAL HIGH (ref 70–99)
Glucose-Capillary: 131 mg/dL — ABNORMAL HIGH (ref 70–99)
Glucose-Capillary: 110 mg/dL — ABNORMAL HIGH (ref 70–99)

## 2020-07-04 MED ORDER — PANTOPRAZOLE SODIUM 40 MG PO PACK
40.0000 mg | PACK | Freq: Every day | ORAL | Status: DC
  Administered 2020-07-05 – 2020-07-22 (×18): 40 mg
  Filled 2020-07-04 (×20): qty 20

## 2020-07-04 MED ORDER — NEPRO/CARBSTEADY PO LIQD
1000.0000 mL | ORAL | Status: DC
  Administered 2020-07-04 – 2020-07-09 (×4): 1000 mL

## 2020-07-04 NOTE — Progress Notes (Signed)
Wyatt Ball , MD [address], [place], [place], [place], [address] 3940 [address], [place], [place], [place], [address] Phone: [phone]  Fax: [phone]   Wyatt Hughey Bouch Brooke Bonito. is being followed for PEG tube s/p  placement  Day 2 of follow up   Subjective: Doing well no complaints   Objective: Vital signs in last 24 hours: Vitals:   07/03/20 2320 07/03/20 2321 07/04/20 0414 07/04/20 0803  BP: (!) 143/68 (!) 143/68 131/74 (!) 154/85  Pulse: 88 88 96 97  Resp: _0 Temp: (!) 97.4 F (36.3 C) (!) 97.4 F (36.3 C) (!) 97.2 F (36.2 C) 98.2 F (36.8 C)  TempSrc: Oral Oral  Oral  SpO2:  99% 99% 99%  Weight:      Height:       Weight change:   Intake/Output Summary (Last 24 hours) at 07/04/2020 0940 Last data filed at 07/03/2020 2200 Gross per 24 hour  Intake 700 ml  Output 550 ml  Net 150 ml     Exam:  Abdomen: PEG tube site appears clean , no swelling or erythemama, no guarding or rigidity, BS+ , peg bumper loosened 1 cm and moving freely    Lab Results: _1 @ Micro Results: Recent Results (from the past 240 hour(s))  Urine Culture     Status: Abnormal   Collection Time: 06/28/20  4:13 PM   Specimen: Urine, Random  Result Value Ref Range Status   Specimen Description URINE, RANDOM  Final   Special Requests NONE  Final   Culture >=100,000 COLONIES/mL YEAST (A)  Final   Report Status 07/01/2020 FINAL  Final  CULTURE, BLOOD (ROUTINE X 2) w Reflex to ID Panel     Status: None   Collection Time: 06/28/20  5:05 PM   Specimen: BLOOD  Result Value Ref Range Status   Specimen Description BLOOD RIGHT HAND  Final   Special Requests   Final    BOTTLES DRAWN AEROBIC AND ANAEROBIC Blood Culture results may not be optimal due to an excessive volume of blood received in culture bottles   Culture   Final    NO GROWTH 5 DAYS Performed at [facility], [address] [place], [place] [address]    Report Status 07/03/2020 FINAL  Final   CULTURE, BLOOD (ROUTINE X 2) w Reflex to ID Panel     Status: None   Collection Time: 06/28/20  5:05 PM   Specimen: BLOOD  Result Value Ref Range Status   Specimen Description BLOOD LEFT ANTECUBITAL  Final   Special Requests   Final    BOTTLES DRAWN AEROBIC AND ANAEROBIC Blood Culture adequate volume   Culture   Final    NO GROWTH 5 DAYS Performed at [facility], [address] [place], [place] [address]    Report Status 07/03/2020 FINAL  Final  MRSA Next Gen by PCR, Nasal     Status: None   Collection Time: 06/29/20  5:10 PM  Result Value Ref Range Status   MRSA by PCR Next Gen NOT DETECTED NOT DETECTED Final    Comment: (NOTE) The GeneXpert MRSA Assay (FDA approved for NASAL specimens only), is one component of a comprehensive MRSA colonization surveillance program. It is not intended to diagnose MRSA infection nor to guide or monitor treatment for MRSA infections. Test performance is not FDA approved in patients less than 35 years old. Performed at [facility], [address] [place], [place] [address]  Studies/Results: No results found. Medications: I have reviewed the patient's current medications. Scheduled Meds:  amLODipine  5 mg Per Tube Daily   chlorhexidine gluconate (MEDLINE KIT)  15 mL Mouth Rinse BID   Chlorhexidine Gluconate Cloth  6 each Topical Daily   cloNIDine  0.3 mg Transdermal Weekly   feeding supplement (PROSource TF)  45 mL Per Tube Daily   free water  200 mL Per Tube Q4H   gabapentin  100 mg Per Tube Q8H   heparin injection (subcutaneous)  5,000 Units Subcutaneous Q8H   insulin aspart  0-20 Units Subcutaneous Q4H   insulin aspart  3 Units Subcutaneous Q4H   insulin glargine  20 Units Subcutaneous Daily   metoprolol tartrate  25 mg Per Tube BID   multivitamin  1 tablet Per Tube QHS   nutrition supplement (JUVEN)  1 packet Per Tube BID   pantoprazole (PROTONIX) IV  40 mg Intravenous Q24H   prazosin  4 mg Per Tube QHS    QUEtiapine  12.5 mg Per Tube BID   sodium chloride flush  10-40 mL Intracatheter Q12H   traZODone  150 mg Per Tube QHS   Continuous Infusions:  sodium chloride Stopped (06/22/20 1614)   feeding supplement (NEPRO CARB STEADY) Stopped (07/03/20 0013)   PRN Meds:.sodium chloride, haloperidol lactate, heparin sodium (porcine), heparin sodium (porcine), hydrALAZINE, labetalol, ondansetron **OR** ondansetron (ZOFRAN) IV, oxyCODONE-acetaminophen, prazosin, sodium chloride flush   Assessment: Principal Problem:   CAP (community acquired pneumonia) Active Problems:   Acute metabolic encephalopathy   Severe sepsis with septic shock (HCC)   Neurogenic bladder   Chronic, continuous use of opioids   Chronic low back pain   Type 2 diabetes mellitus without complication (HCC)   Migraines   Recurrent UTI   History of colon polyps   Urinary retention   Altered mental status   Pressure injury of skin   AKI (acute kidney injury) ([place])  S/p PEG tube placement on 07/03/2020. I have loosened the bumper today , abdomen is soft and non tender, can use G tube to feed, change dressings daily. Till wound healed.   I will sign off.  Please call me if any further GI concerns or questions.  We would like to thank you for the opportunity to participate in the care of Wyatt Ball..    LOS: 30 days   Wyatt Bellows, MD 07/04/2020, 9:40 AM

## 2020-07-04 NOTE — TOC Progression Note (Addendum)
Transition of Care (TOC) - Progression Note    Patient Details  Name: Wyatt Ball. MRN: [id number] Date of Birth: [birth year]  Transition of Care [facility]) CM/SW Contact  Caryn Section, RN Phone Number: 07/04/2020, 4:18 PM  Clinical Narrative:   RNCM contacted [facility] to [facility] benefits.  [facility] for local [facility]. Reference number [id number] phone [phone] SNF benefits started 05/30/2017 Local center number:[phone] ext [phone] or press 0    Expected Discharge Plan: Skilled Nursing Facility Barriers to Discharge: Continued Medical Work up  Expected Discharge Plan and Services Expected Discharge Plan: Skilled Nursing Facility In-house Referral: NA   Post Acute Care Choice: Skilled Nursing Facility Living arrangements for the past 2 months: Single Family Home                                       Social Determinants of Health (SDOH) Interventions    Readmission Risk Interventions No flowsheet data found.

## 2020-07-04 NOTE — Progress Notes (Signed)
PROGRESS NOTE    Wyatt Ball.   KGS:[id number]  DOB: [birth year]  PCP: System, Provider Not In    DOA: 06/04/2020 LOS: 30   Assessment & Plan   Principal Problem:   CAP (community acquired pneumonia) Active Problems:   Acute metabolic encephalopathy   Severe sepsis with septic shock ([place])   Neurogenic bladder   Chronic, continuous use of opioids   Chronic low back pain   Type 2 diabetes mellitus without complication (HCC)   Migraines   Recurrent UTI   History of colon polyps   Urinary retention   Altered mental status   Pressure injury of skin   AKI (acute kidney injury) ([place])   Septic shock secondary to community-acquired pneumonia: Status post 8 days azithromycin, 3 days of cephalosporin plus 5 days of IV meropenem followed by 7 days of Levaquin and doxycycline.   S/p bronchoscopy blood cultures were negative.   No growth on blood cultures . Monitor closely.   New fever and tachycardia on 06/28/2020, new sepsis secondary to bilateral pneumonia: MRSA screen was negative.  No growth on repeat blood cultures.  He was treated with IV vancomycin from 06/28/2020 through 06/30/2020 and IV meropenem from 06/28/2020 through 07/01/2020.  Urine culture from 06/28/2020 showed yeast.  Of note, patient had previously been treated with IV fluconazole from 06/25/2020 through 06/30/2020.   Acute hypoxic respiratory failure: Due to PNA. Resolved.  S/p intubation and extubation.     AKI, hyperphosphatemia: stable, Cr remains up.  Good UO.  Hemodialysis was initiated on 06/10/2020, now off and HD catheter removed. No plan for further hemodialysis.  He is not a good candidate for long-term dialysis per nephrologist.     Hypernatremia: Continue IV 5% dextrose infusion for hypernatremia.  Monitor BMP.  Free water flushes per G tube - increase if Na not improved tomorrow.   Acute metabolic encephalopathy, agitation, history of PTSD: Patient has been evaluated by neurologist.  EEG was  unremarkable.   --Continue psychotropics and prazosin   Inadequate PO intake due to Dysphagia - inability to eat due to acute prolonged illness as above. --G tube placed 07/03/20 --Tube feeds continuous, per RD orders --NPO status  Hypertension: Continue antihypertensives   Stage II coccygeal decubitus ulcer: Present on admission: Continue foam dressing. Pressure Injury 06/09/20 Coccyx Mid Deep Tissue Pressure Injury - Purple or maroon localized area of discolored intact skin or blood-filled blister due to damage of underlying soft tissue from pressure and/or shear. (Active)  06/09/20 2014  Location: Coccyx  Location Orientation: Mid  Staging: Deep Tissue Pressure Injury - Purple or maroon localized area of discolored intact skin or blood-filled blister due to damage of underlying soft tissue from pressure and/or shear.  Wound Description (Comments):   Present on Admission: No     Pressure Injury 06/23/20 Coccyx Medial Unstageable - Full thickness tissue loss in which the base of the injury is covered by slough (yellow, tan, gray, green or brown) and/or eschar (tan, brown or black) in the wound bed. (Active)  06/23/20 1400  Location: Coccyx  Location Orientation: Medial  Staging: Unstageable - Full thickness tissue loss in which the base of the injury is covered by slough (yellow, tan, gray, green or brown) and/or eschar (tan, brown or black) in the wound bed.  Wound Description (Comments):   Present on Admission: Yes (on admission to 2a from icu)     Pressure Injury 06/23/20 Coccyx Posterior;Mid Stage 2 -  Partial thickness loss of dermis presenting as  a shallow open injury with a red, pink wound bed without slough. (Active)  06/23/20 1400  Location: Coccyx  Location Orientation: Posterior;Mid  Staging: Stage 2 -  Partial thickness loss of dermis presenting as a shallow open injury with a red, pink wound bed without slough.  Wound Description (Comments):   Present on Admission: Yes      Hyperkalemia: Resolved   Neurogenic bladder due to hx of transverse myelitis  Chronic pain syndrome on opioids   Obesity: Body mass index is 30.69 kg/m.  Complicates overall care and prognosis.  Recommend lifestyle modifications including physical activity and diet for weight loss and overall long-term health.   DVT prophylaxis: heparin injection 5,000 Units Start: 06/10/20 2200   Diet:     Code Status: DNR   Brief Narrative / Hospital Course to Date:   Medical records reviewed and are as summarized below:  Wyatt Yohan Glahn Brooke Bonito. is a 60 y.o. male with medical history significant for hypertension, PTSD, diabetes, migraine, GERD, neurogenic bladder secondary to remote history of transverse myelitis requiring self urethral catheterization, frequent UTIs, chronic back pain on opioids, generally independent at baseline.  He usually gets his care at the [facility] hospital.  He was brought to the emergency room because of altered mental status, cough, shortness of breath, fever, vomiting and diarrhea.   He was admitted to the hospital for sepsis secondary to pneumonia.  He was transferred to the ICU for septic shock, AKI, COPD exacerbation.  He was intubated and placed on mechanical ventilation.  He was treated with empiric IV antibiotics including IV fluconazole for yeast in sputum, IV fluids.  NG tube was placed for enteral nutrition and medications because of severe dysphagia.  Hemodialysis was discontinued because he is not a candidate for long-term hemodialysis per nephrologist.   5/21 admitted for sepsis and pneumonia. 6/2 transferred to ICU, intubated underwent bronchoscopy 6/3 self extubated followed by reintubated 6/7 HD started 6/15 extubated 6/19 transferred to [facility]. Neurology, nephrology, urology and infectious disease consulted.     After transfer to progressive care unit, he developed fever and tachycardia that was concerning for sepsis on 06/28/2020.  He was restarted on IV  antibiotics (meropenem and vancomycin).  Repeat blood culture did not show any growth.  Gastroenterologist was consulted for PEG tube placement per family's request.    Subjective 07/04/20    Pt seen with RN at bedside, changing gauze at G tube site.  He reports some pain at the site, but no other acute complaints.  Not very interactive, slow/delayed answers.    Disposition Plan & Communication   Status is: Inpatient  Remains inpatient appropriate because:Inpatient level of care appropriate due to severity of illness  Dispo: The patient is from: Home              Anticipated d/c is to: SNF vs LTAC              Patient currently is not medically stable to d/c.   Difficult to place patient No   Consults, Procedures, Significant Events   Consultants:  Gastroenterology Palliative care Nephrology  Procedures:  As above  Antimicrobials:  Anti-infectives (From admission, onward)    Start     Dose/Rate Route Frequency Ordered Stop   06/28/20 1815  vancomycin (VANCOREADY) IVPB 1750 mg/350 mL        1,750 mg 175 mL/hr over 120 Minutes Intravenous  Once 06/28/20 1726 06/28/20 2054   06/28/20 1815  meropenem (MERREM) 1 g in sodium chloride  0.9 % 100 mL IVPB  Status:  Discontinued        1 g 200 mL/hr over 30 Minutes Intravenous Every 12 hours 06/28/20 1726 07/01/20 1558   06/28/20 1742  vancomycin variable dose per unstable renal function (pharmacist dosing)  Status:  Discontinued         Does not apply See admin instructions 06/28/20 1742 06/30/20 0757   06/26/20 2100  fluconazole (DIFLUCAN) IVPB 50 mg       See Hyperspace for full Linked Orders Report.   50 mg 25 mL/hr over 60 Minutes Intravenous Every 24 hours 06/25/20 1835 06/30/20 2118   06/25/20 2000  fluconazole (DIFLUCAN) IVPB 200 mg       See Hyperspace for full Linked Orders Report.   200 mg 100 mL/hr over 60 Minutes Intravenous  Once 06/25/20 1835 06/25/20 2348   06/14/20 1800  levofloxacin (LEVAQUIN) IVPB 500 mg         500 mg 100 mL/hr over 60 Minutes Intravenous Every 48 hours 06/12/20 1624 06/19/20 0210   06/12/20 1800  levofloxacin (LEVAQUIN) IVPB 750 mg        750 mg 100 mL/hr over 90 Minutes Intravenous  Once 06/11/20 2239 06/12/20 1904   06/11/20 1800  meropenem (MERREM) 500 mg in sodium chloride 0.9 % 100 mL IVPB  Status:  Discontinued        500 mg 200 mL/hr over 30 Minutes Intravenous Every 24 hours 06/10/20 1444 06/11/20 2231   06/10/20 1300  doxycycline (VIBRAMYCIN) 100 mg in sodium chloride 0.9 % 250 mL IVPB  Status:  Discontinued        100 mg 125 mL/hr over 120 Minutes Intravenous Every 12 hours 06/10/20 1149 06/17/20 1704   06/09/20 1015  meropenem (MERREM) 500 mg in sodium chloride 0.9 % 100 mL IVPB        500 mg 200 mL/hr over 30 Minutes Intravenous Every 12 hours 06/09/20 0922 06/11/20 1203   06/08/20 2200  meropenem (MERREM) 1 g in sodium chloride 0.9 % 100 mL IVPB  Status:  Discontinued        1 g 200 mL/hr over 30 Minutes Intravenous Every 12 hours 06/08/20 0845 06/09/20 0922   06/07/20 2200  azithromycin (ZITHROMAX) 500 mg in sodium chloride 0.9 % 250 mL IVPB        500 mg 250 mL/hr over 60 Minutes Intravenous Every 24 hours 06/07/20 1441 06/11/20 2211   06/07/20 1600  ceFEPIme (MAXIPIME) 2 g in sodium chloride 0.9 % 100 mL IVPB  Status:  Discontinued        2 g 200 mL/hr over 30 Minutes Intravenous Every 8 hours 06/07/20 1441 06/07/20 1442   06/07/20 1600  meropenem (MERREM) 1 g in sodium chloride 0.9 % 100 mL IVPB  Status:  Discontinued        1 g 200 mL/hr over 30 Minutes Intravenous Every 8 hours 06/07/20 1442 06/08/20 0845   06/06/20 1630  vancomycin (VANCOREADY) IVPB 1500 mg/300 mL  Status:  Discontinued        1,500 mg 150 mL/hr over 120 Minutes Intravenous  Once 06/06/20 1533 06/06/20 1632   06/05/20 0600  cefTRIAXone (ROCEPHIN) 2 g in sodium chloride 0.9 % 100 mL IVPB  Status:  Discontinued        2 g 200 mL/hr over 30 Minutes Intravenous Every 24 hours 06/04/20 2029  06/07/20 1439   06/04/20 2200  azithromycin (ZITHROMAX) 500 mg in sodium chloride 0.9 % 250 mL IVPB  500 mg 250 mL/hr over 60 Minutes Intravenous Every 24 hours 06/04/20 2029 06/07/20 0003   06/04/20 1915  ceFEPIme (MAXIPIME) 2 g in sodium chloride 0.9 % 100 mL IVPB        2 g 200 mL/hr over 30 Minutes Intravenous  Once 06/04/20 1906 06/04/20 2055   06/04/20 1915  metroNIDAZOLE (FLAGYL) IVPB 500 mg        500 mg 100 mL/hr over 60 Minutes Intravenous  Once 06/04/20 1906 06/04/20 2055   06/04/20 1915  vancomycin (VANCOCIN) IVPB 1000 mg/200 mL premix  Status:  Discontinued        1,000 mg 200 mL/hr over 60 Minutes Intravenous  Once 06/04/20 1906 06/04/20 1912   06/04/20 1915  vancomycin (VANCOREADY) IVPB 1750 mg/350 mL        1,750 mg 175 mL/hr over 120 Minutes Intravenous  Once 06/04/20 1912 06/04/20 2257         Micro    Objective   Vitals:   07/03/20 2321 07/04/20 0414 07/04/20 0803 07/04/20 1150  BP: (!) 143/68 131/74 (!) 154/85 (!) 170/87  Pulse: 88 96 97 94  Resp: 16 17 18 20   Temp: (!) 97.4 F (36.3 C) (!) 97.2 F (36.2 C) 98.2 F (36.8 C) 98.3 F (36.8 C)  TempSrc: Oral  Oral Oral  SpO2: 99% 99% 99% 99%  Weight:      Height:        Intake/Output Summary (Last 24 hours) at 07/04/2020 1616 Last data filed at 07/03/2020 2200 Gross per 24 hour  Intake 300 ml  Output 550 ml  Net -250 ml   Filed Weights   06/30/20 0500 07/01/20 0500 07/02/20 0310  Weight: 89.4 kg 60.4 kg 86.2 kg    Physical Exam:  General exam: awake, alert, confused, no acute distress Respiratory system: CTAB, normal respiratory effort, on rooma air. Cardiovascular system: normal S1/S2, RRR   Gastrointestinal system: soft, NT, ND G-tube present with no surrounding warmth, erythema, clean gauze dressing overlays G tube. Central nervous system: alert but confused, slow or no responses, no focal deficits, does not answer orientation questions, follows commands Skin: dry, intact, normal  temperature and color  Labs   Data Reviewed: I have personally reviewed following labs and imaging studies  CBC: Recent Labs  Lab 06/28/20 0624 06/29/20 0500 06/30/20 0431 07/01/20 0742  WBC 6.3 10.1 6.8 5.0  NEUTROABS 4.8 8.2* 5.0 3.3  HGB 8.9* 8.3* 8.3* 9.2*  HCT 27.2* 25.8* 25.7* 28.5*  MCV 91.0 92.1 92.1 90.2  PLT 236 257 250 093   Basic Metabolic Panel: Recent Labs  Lab 06/28/20 0624 06/29/20 0500 06/29/20 0500 06/30/20 0431 07/01/20 0742 07/01/20 0747 07/02/20 0428 07/03/20 0530 07/04/20 0500  NA  --  145   < > 145  --  150* 150* 151* 151*  K  --  4.0   < > 3.8  --  4.3 3.6 3.8 3.7  CL  --  109   < > 109  --  113* 114* 115* 118*  CO2  --  23   < > 24  --  23 25 26 25   GLUCOSE  --  153*   < > 182*  --  152* 202* 134* 137*  BUN  --  164*   < > 163*  --  157* 136* 137* 136*  CREATININE  --  6.09*   < > 5.54*  --  5.47* 5.44* 4.88* 4.52*  CALCIUM  --  10.0   < >  10.5*  --  10.8* 10.4* 10.0 9.8  MG 2.9* 2.9*  --  3.0* 3.0*  --   --   --   --   PHOS 8.5* 7.7*  --  7.8*  --  7.1*  --   --   --    < > = values in this interval not displayed.   GFR: Estimated Creatinine Clearance: 17.7 mL/min (A) (by C-G formula based on SCr of 4.52 mg/dL (H)). Liver Function Tests: Recent Labs  Lab 07/01/20 0747  ALBUMIN 2.8*   No results for input(s): LIPASE, AMYLASE in the last 168 hours. No results for input(s): AMMONIA in the last 168 hours. Coagulation Profile: No results for input(s): INR, PROTIME in the last 168 hours. Cardiac Enzymes: No results for input(s): CKTOTAL, CKMB, CKMBINDEX, TROPONINI in the last 168 hours. BNP (last 3 results) No results for input(s): PROBNP in the last 8760 hours. HbA1C: No results for input(s): HGBA1C in the last 72 hours. CBG: Recent Labs  Lab 07/03/20 1931 07/03/20 2349 07/04/20 0411 07/04/20 0802 07/04/20 1151  GLUCAP 106* 123* 127* 98 144*   Lipid Profile: No results for input(s): CHOL, HDL, LDLCALC, TRIG, CHOLHDL,  LDLDIRECT in the last 72 hours. Thyroid Function Tests: No results for input(s): TSH, T4TOTAL, FREET4, T3FREE, THYROIDAB in the last 72 hours. Anemia Panel: No results for input(s): VITAMINB12, FOLATE, FERRITIN, TIBC, IRON, RETICCTPCT in the last 72 hours. Sepsis Labs: Recent Labs  Lab 06/28/20 1705 06/28/20 2130 06/29/20 0500 06/30/20 0431  PROCALCITON 0.34  --  1.28 1.04  LATICACIDVEN 1.6 0.4*  --   --     Recent Results (from the past 240 hour(s))  Urine Culture     Status: Abnormal   Collection Time: 06/28/20  4:13 PM   Specimen: Urine, Random  Result Value Ref Range Status   Specimen Description URINE, RANDOM  Final   Special Requests NONE  Final   Culture >=100,000 COLONIES/mL YEAST (A)  Final   Report Status 07/01/2020 FINAL  Final  CULTURE, BLOOD (ROUTINE X 2) w Reflex to ID Panel     Status: None   Collection Time: 06/28/20  5:05 PM   Specimen: BLOOD  Result Value Ref Range Status   Specimen Description BLOOD RIGHT HAND  Final   Special Requests   Final    BOTTLES DRAWN AEROBIC AND ANAEROBIC Blood Culture results may not be optimal due to an excessive volume of blood received in culture bottles   Culture   Final    NO GROWTH 5 DAYS Performed at [facility], [address] [place], [place] [address]    Report Status 07/03/2020 FINAL  Final  CULTURE, BLOOD (ROUTINE X 2) w Reflex to ID Panel     Status: None   Collection Time: 06/28/20  5:05 PM   Specimen: BLOOD  Result Value Ref Range Status   Specimen Description BLOOD LEFT ANTECUBITAL  Final   Special Requests   Final    BOTTLES DRAWN AEROBIC AND ANAEROBIC Blood Culture adequate volume   Culture   Final    NO GROWTH 5 DAYS Performed at [facility], [address] [place], [place] [address]    Report Status 07/03/2020 FINAL  Final  MRSA Next Gen by PCR, Nasal     Status: None   Collection Time: 06/29/20  5:10 PM  Result Value Ref Range Status   MRSA by PCR Next Gen NOT DETECTED NOT  DETECTED Final    Comment: (NOTE) The  GeneXpert MRSA Assay (FDA approved for NASAL specimens only), is one component of a comprehensive MRSA colonization surveillance program. It is not intended to diagnose MRSA infection nor to guide or monitor treatment for MRSA infections. Test performance is not FDA approved in patients less than 83 years old. Performed at [facility], [address] [place], [place] [address]       Imaging Studies   No results found.   Medications   Scheduled Meds:  amLODipine  5 mg Per Tube Daily   chlorhexidine gluconate (MEDLINE KIT)  15 mL Mouth Rinse BID   Chlorhexidine Gluconate Cloth  6 each Topical Daily   cloNIDine  0.3 mg Transdermal Weekly   free water  200 mL Per Tube Q4H   gabapentin  100 mg Per Tube Q8H   heparin injection (subcutaneous)  5,000 Units Subcutaneous Q8H   insulin aspart  0-20 Units Subcutaneous Q4H   insulin aspart  3 Units Subcutaneous Q4H   insulin glargine  20 Units Subcutaneous Daily   metoprolol tartrate  25 mg Per Tube BID   multivitamin  1 tablet Per Tube QHS   nutrition supplement (JUVEN)  1 packet Per Tube BID   [START ON 07/05/2020] pantoprazole sodium  40 mg Per Tube Daily   prazosin  4 mg Per Tube QHS   QUEtiapine  12.5 mg Per Tube BID   sodium chloride flush  10-40 mL Intracatheter Q12H   traZODone  150 mg Per Tube QHS   Continuous Infusions:  sodium chloride Stopped (06/22/20 1614)   feeding supplement (NEPRO CARB STEADY) 1,000 mL (07/04/20 1347)       LOS: 30 days    Time spent: 30 minutes    Ezekiel Slocumb, DO [facility]  07/04/2020, 4:16 PM      If 7PM-7AM, please contact night-coverage. How to contact the [facility] Attending or Consulting provider [place] or covering provider during after hours [place], for this patient?    Check the care team in [facility] and look for a) attending/consulting TR[facility] provider listed and b) the [facility] team listed Log into [email or web address] and use [facility]'s universal password to access. If you do not have the password, please contact the hospital operator. Locate the [facility] provider you are looking for under [facility] and page to a number that you can be directly reached. If you still have difficulty reaching the provider, please page the [facility] (Director on Call) for the Hospitalists listed on amion for assistance.

## 2020-07-04 NOTE — Progress Notes (Signed)
Nutrition Follow-up  DOCUMENTATION CODES:  Obesity unspecified  INTERVENTION:  Continue TF as ordered Nepro 1.8 @50  ml/hr  Free water flushes 236m q 4 hours per MD Regimen provides 2124kcal/day, 97g/day protein and 2072 ml/day free water (flushes + TF) Rena-vit daily via tube  Juven Fruit Punch BID via tube, each serving provides 95kcal and 2.5g of protein (amino acids glutamine and arginine)  NUTRITION DIAGNOSIS:  Inadequate oral intake related to inability to eat as evidenced by NPO status  - ongoing  GOAL:  Patient will meet greater than or equal to 90% of their needs  - met with TF  MONITOR:  Labs, Weight trends, Diet advancement, Skin, I & O's  ASSESSMENT:  60y.o. male with h/o hypertension, PTSD, diabetes, migraines, GERD, neurogenic bladder secondary to remote history of transverse myelitis, requires self-catheterization and has frequent UTIs and opioids for chronic pain who is admitted on 06/04/2020 for CAP and sepsis   6/6 HD initiation 6/17 NGT replaced 6/24 NGT replaced again 6/27 - Nephrology signed off, no further HD 6/29 - PEG placed by GI  Palliative care met with family on 6/27 and decision was made to move forward with PEG placement as pt has not made any progress towards PO intake goals with SLP.  PEG placed by GI 6/29 and is now able to be used for feedings.   Noted high Na and elevated BUN and creatinine. Family opted to no longer continue HD. Will make slight adjustment to pt's TF as HD is longer adding additional nutrition needs onto pt. Stage II wounds present to sacrum.   Nutritionally Relevant Medications: Scheduled Meds:  feeding supplement (PROSource TF)  45 mL Per Tube Daily   free water  200 mL Per Tube Q4H   insulin aspart  0-20 Units Subcutaneous Q4H   insulin aspart  3 Units Subcutaneous Q4H   insulin glargine  20 Units Subcutaneous Daily   multivitamin  1 tablet Per Tube QHS   nutrition supplement (JUVEN)  1 packet Per Tube BID   [START  ON 07/05/2020] pantoprazole sodium  40 mg Per Tube Daily   Continuous Infusions:  feeding supplement (NEPRO CARB STEADY) 1,000 mL (07/04/20 1000)   PRN Meds: ondansetron  Labs Reviewed: Na 151 / Chloride 118 BUN 136, creatinine 4.52 SBG ranges from 106-144 mg/dL over the last 24 hours HgbA1c 7.4% (5/31)  Diet Order:   Diet Order     None      EDUCATION NEEDS:  No education needs have been identified at this time  Skin:  Skin Assessment: Skin Integrity Issues: (sacral wound 1.8 cm x 0.8 cm) Skin Integrity Issues:: Stage II Stage II: coccyx  Last BM:  6/29  Height:  Ht Readings from Last 1 Encounters:  06/18/20 5' 5.98" (1.676 m)    Weight:  Wt Readings from Last 1 Encounters:  07/02/20 86.2 kg   Ideal Body Weight:  64.5 kg  BMI:  Body mass index is 30.69 kg/m.  Estimated Nutritional Needs:  Kcal:  2000-2200 kcal/d Protein:  100-110 g/d Fluid:  2-2.2 L/d  RRanell Patrick RD, LDN Clinical Dietitian Pager [place] [facility]

## 2020-07-04 NOTE — Progress Notes (Signed)
     Chart Reviewed. Updates Received. Patient Assessed.   Patient is much more awake and alert. Invites me in on arrival. Alert to self, not place. When I inform him that he is in the hospital he states "oh I am still here." He is able to tell me his brother's name and states he hopes his brother, [name] is walking their dogs.   No acute distress noted. Denies pain and shortness of breath. No family at the bedside. Follows simple commands. PEG placed on yesterday. Tube feeding running. Patient seems to be tolerating.   I spoke with patient's brother [name] and provided updates. He expressed his appreciation of patient's somewhat improvement. He shares that Deroy's mental state is much improved. [name] visited patient on yesterday and states they had a great visit and conversation. Patient's responses were appropriate. He was able to speak with family over the phone and recognized them by voice.   [name] confirms goals to continue to treat the treatable as he and family remains hopeful for patient's continued improvement. He understands patient remains weak with health challenges but states he is ready to go the course to allow Gaspar Garbe every opportunity to improve and continue to thrive.   All questions answered and support provided.   Assessment Much more awake, alert to self and brother, ill appearing RRR Normal breathing pattern PEG tube in place, + BS  Plan -Continue to treat the treatable -Family is remaining hopeful for continued improvement/stability.  Time Total: 40 min.   Visit consisted of counseling and education dealing with the complex and emotionally intense issues of symptom management and palliative care in the setting of serious and potentially life-threatening illness.Greater than 50%  of this time was spent counseling and coordinating care related to the above assessment and plan.  Willette Alma, AGPCNP-BC  Palliative Medicine Team [phone]

## 2020-07-05 DIAGNOSIS — E119 Type 2 diabetes mellitus without complications: Secondary | ICD-10-CM | POA: Diagnosis not present

## 2020-07-05 DIAGNOSIS — N319 Neuromuscular dysfunction of bladder, unspecified: Secondary | ICD-10-CM | POA: Diagnosis not present

## 2020-07-05 DIAGNOSIS — G9341 Metabolic encephalopathy: Secondary | ICD-10-CM | POA: Diagnosis not present

## 2020-07-05 LAB — BASIC METABOLIC PANEL
Anion gap: 9 (ref 5–15)
BUN: 130 mg/dL — ABNORMAL HIGH (ref 8–23)
CO2: 26 mmol/L (ref 22–32)
Calcium: 10 mg/dL (ref 8.9–10.3)
Chloride: 116 mmol/L — ABNORMAL HIGH (ref 98–111)
Creatinine, Ser: 4.1 mg/dL — ABNORMAL HIGH (ref 0.61–1.24)
GFR, Estimated: 16 mL/min — ABNORMAL LOW (ref >60)
Glucose, Bld: 147 mg/dL — ABNORMAL HIGH (ref 70–99)
Potassium: 3.9 mmol/L (ref 3.5–5.1)
Sodium: 151 mmol/L — ABNORMAL HIGH (ref 135–145)

## 2020-07-05 LAB — GLUCOSE, CAPILLARY
Glucose-Capillary: 107 mg/dL — ABNORMAL HIGH (ref 70–99)
Glucose-Capillary: 109 mg/dL — ABNORMAL HIGH (ref 70–99)
Glucose-Capillary: 135 mg/dL — ABNORMAL HIGH (ref 70–99)
Glucose-Capillary: 143 mg/dL — ABNORMAL HIGH (ref 70–99)
Glucose-Capillary: 147 mg/dL — ABNORMAL HIGH (ref 70–99)
Glucose-Capillary: 171 mg/dL — ABNORMAL HIGH (ref 70–99)
Glucose-Capillary: 182 mg/dL — ABNORMAL HIGH (ref 70–99)

## 2020-07-05 LAB — PHOSPHORUS: Phosphorus: 6.3 mg/dL — ABNORMAL HIGH (ref 2.5–4.6)

## 2020-07-05 LAB — MAGNESIUM: Magnesium: 2.5 mg/dL — ABNORMAL HIGH (ref 1.7–2.4)

## 2020-07-05 MED ORDER — FREE WATER
200.0000 mL | Status: DC
  Administered 2020-07-05 – 2020-07-06 (×11): 200 mL

## 2020-07-05 NOTE — Progress Notes (Addendum)
PROGRESS NOTE    Wyatt Ball.   KNL:[id number]  DOB: 07-12-1960  PCP: System, Provider Not In    DOA: 06/04/2020 LOS: 31   Assessment & Plan   Principal Problem:   CAP (community acquired pneumonia) Active Problems:   Acute metabolic encephalopathy   Severe sepsis with septic shock (HCC)   Neurogenic bladder   Chronic, continuous use of opioids   Chronic low back pain   Type 2 diabetes mellitus without complication (HCC)   Migraines   Recurrent UTI   History of colon polyps   Urinary retention   Altered mental status   Pressure injury of skin   AKI (acute kidney injury) ([place])   Septic shock secondary to community-acquired pneumonia: Status post 8 days azithromycin, 3 days of cephalosporin plus 5 days of IV meropenem followed by 7 days of Levaquin and doxycycline.   S/p bronchoscopy blood cultures were negative.   No growth on blood cultures . Monitor closely.   New fever and tachycardia on 06/28/2020, new sepsis secondary to bilateral pneumonia: MRSA screen was negative.  No growth on repeat blood cultures.  He was treated with IV vancomycin from 06/28/2020 through 06/30/2020 and IV meropenem from 06/28/2020 through 07/01/2020.  Urine culture from 06/28/2020 showed yeast.  Of note, patient had previously been treated with IV fluconazole from 06/25/2020 through 06/30/2020.   Acute hypoxic respiratory failure: Due to PNA. Resolved.  S/p intubation and extubation.     AKI, hyperphosphatemia: stable, Cr remains up.  Good UO.  Hemodialysis was initiated on 06/10/2020, now off and HD catheter removed. No plan for further hemodialysis.  He is not a good candidate for long-term dialysis per nephrologist.     Hypernatremia: Continue IV 5% dextrose infusion for hypernatremia.  Monitor BMP.  Free water flushes per G tube.   7/1 - Na still 151, unchanged.  Increase free water to 200 cc q2h (from X9K)   Acute metabolic encephalopathy, agitation, history of PTSD: Patient has been  evaluated by neurologist.  EEG was unremarkable.   --Continue psychotropics and prazosin   Inadequate PO intake due to Dysphagia - inability to eat due to acute prolonged illness as above. --G tube placed 07/03/20 --Tube feeds continuous, per RD orders --NPO status  Hypertension: Continue antihypertensives   Stage II coccygeal decubitus ulcer: Present on admission: Continue foam dressing.  Frequent repositioning. Monitor closely. Pressure Injury 06/09/20 Coccyx Mid Deep Tissue Pressure Injury - Purple or maroon localized area of discolored intact skin or blood-filled blister due to damage of underlying soft tissue from pressure and/or shear. (Active)  06/09/20 2014  Location: Coccyx  Location Orientation: Mid  Staging: Deep Tissue Pressure Injury - Purple or maroon localized area of discolored intact skin or blood-filled blister due to damage of underlying soft tissue from pressure and/or shear.  Wound Description (Comments):   Present on Admission: No     Pressure Injury 06/23/20 Coccyx Medial Unstageable - Full thickness tissue loss in which the base of the injury is covered by slough (yellow, tan, gray, green or brown) and/or eschar (tan, brown or black) in the wound bed. (Active)  06/23/20 1400  Location: Coccyx  Location Orientation: Medial  Staging: Unstageable - Full thickness tissue loss in which the base of the injury is covered by slough (yellow, tan, gray, green or brown) and/or eschar (tan, brown or black) in the wound bed.  Wound Description (Comments):   Present on Admission: Yes (on admission to 2a from icu)  Pressure Injury 06/23/20 Coccyx Posterior;Mid Stage 2 -  Partial thickness loss of dermis presenting as a shallow open injury with a red, pink wound bed without slough. (Active)  06/23/20 1400  Location: Coccyx  Location Orientation: Posterior;Mid  Staging: Stage 2 -  Partial thickness loss of dermis presenting as a shallow open injury with a red, pink wound bed  without slough.  Wound Description (Comments):   Present on Admission: Yes     Hyperkalemia: Resolved   Neurogenic bladder due to hx of transverse myelitis  Chronic pain syndrome on opioids   Obesity: Body mass index is 30.69 kg/m.  Complicates overall care and prognosis.  Recommend lifestyle modifications including physical activity and diet for weight loss and overall long-term health.   DVT prophylaxis: heparin injection 5,000 Units Start: 06/10/20 2200   Diet:     Code Status: DNR   Brief Narrative / Hospital Course to Date:   Medical records reviewed and are as summarized below:  Wyatt Geary Shankles Brooke Bonito. is a 60 y.o. male with medical history significant for hypertension, PTSD, diabetes, migraine, GERD, neurogenic bladder secondary to remote history of transverse myelitis requiring self urethral catheterization, frequent UTIs, chronic back pain on opioids, generally independent at baseline.  He usually gets his care at the [facility] hospital.  He was brought to the emergency room because of altered mental status, cough, shortness of breath, fever, vomiting and diarrhea.   He was admitted to the hospital for sepsis secondary to pneumonia.  He was transferred to the ICU for septic shock, AKI, COPD exacerbation.  He was intubated and placed on mechanical ventilation.  He was treated with empiric IV antibiotics including IV fluconazole for yeast in sputum, IV fluids.  NG tube was placed for enteral nutrition and medications because of severe dysphagia.  Hemodialysis was discontinued because he is not a candidate for long-term hemodialysis per nephrologist.   5/21 admitted for sepsis and pneumonia. 6/2 transferred to ICU, intubated underwent bronchoscopy 6/3 self extubated followed by reintubated 6/7 HD started 6/15 extubated 6/19 transferred to [facility]. Neurology, nephrology, urology and infectious disease consulted.     After transfer to progressive care unit, he developed fever and  tachycardia that was concerning for sepsis on 06/28/2020.  He was restarted on IV antibiotics (meropenem and vancomycin).  Repeat blood culture did not show any growth.  Gastroenterologist was consulted for PEG tube placement per family's request.    Subjective 07/05/20    Pt seen with RN at bedside, changing gauze at G tube site.  He reports some pain at the site, but no other acute complaints.  Not very interactive, slow/delayed answers.    Disposition Plan & Communication   Status is: Inpatient  Remains inpatient appropriate because:Inpatient level of care appropriate due to severity of illness with ongoing electrolyte derangements and encephalopathy which is very slowly improving.  Dispo: The patient is from: Home              Anticipated d/c is to: SNF vs LTAC              Patient currently is not medically stable to d/c.   Difficult to place patient No   Consults, Procedures, Significant Events   Consultants:  Gastroenterology Palliative care Nephrology  Procedures:  As above  Antimicrobials:  Anti-infectives (From admission, onward)    Start     Dose/Rate Route Frequency Ordered Stop   06/28/20 1815  vancomycin (VANCOREADY) IVPB 1750 mg/350 mL  1,750 mg 175 mL/hr over 120 Minutes Intravenous  Once 06/28/20 1726 06/28/20 2054   06/28/20 1815  meropenem (MERREM) 1 g in sodium chloride 0.9 % 100 mL IVPB  Status:  Discontinued        1 g 200 mL/hr over 30 Minutes Intravenous Every 12 hours 06/28/20 1726 07/01/20 1558   06/28/20 1742  vancomycin variable dose per unstable renal function (pharmacist dosing)  Status:  Discontinued         Does not apply See admin instructions 06/28/20 1742 06/30/20 0757   06/26/20 2100  fluconazole (DIFLUCAN) IVPB 50 mg       See Hyperspace for full Linked Orders Report.   50 mg 25 mL/hr over 60 Minutes Intravenous Every 24 hours 06/25/20 1835 06/30/20 2118   06/25/20 2000  fluconazole (DIFLUCAN) IVPB 200 mg       See Hyperspace for  full Linked Orders Report.   200 mg 100 mL/hr over 60 Minutes Intravenous  Once 06/25/20 1835 06/25/20 2348   06/14/20 1800  levofloxacin (LEVAQUIN) IVPB 500 mg        500 mg 100 mL/hr over 60 Minutes Intravenous Every 48 hours 06/12/20 1624 06/19/20 0210   06/12/20 1800  levofloxacin (LEVAQUIN) IVPB 750 mg        750 mg 100 mL/hr over 90 Minutes Intravenous  Once 06/11/20 2239 06/12/20 1904   06/11/20 1800  meropenem (MERREM) 500 mg in sodium chloride 0.9 % 100 mL IVPB  Status:  Discontinued        500 mg 200 mL/hr over 30 Minutes Intravenous Every 24 hours 06/10/20 1444 06/11/20 2231   06/10/20 1300  doxycycline (VIBRAMYCIN) 100 mg in sodium chloride 0.9 % 250 mL IVPB  Status:  Discontinued        100 mg 125 mL/hr over 120 Minutes Intravenous Every 12 hours 06/10/20 1149 06/17/20 1704   06/09/20 1015  meropenem (MERREM) 500 mg in sodium chloride 0.9 % 100 mL IVPB        500 mg 200 mL/hr over 30 Minutes Intravenous Every 12 hours 06/09/20 0922 06/11/20 1203   06/08/20 2200  meropenem (MERREM) 1 g in sodium chloride 0.9 % 100 mL IVPB  Status:  Discontinued        1 g 200 mL/hr over 30 Minutes Intravenous Every 12 hours 06/08/20 0845 06/09/20 0922   06/07/20 2200  azithromycin (ZITHROMAX) 500 mg in sodium chloride 0.9 % 250 mL IVPB        500 mg 250 mL/hr over 60 Minutes Intravenous Every 24 hours 06/07/20 1441 06/11/20 2211   06/07/20 1600  ceFEPIme (MAXIPIME) 2 g in sodium chloride 0.9 % 100 mL IVPB  Status:  Discontinued        2 g 200 mL/hr over 30 Minutes Intravenous Every 8 hours 06/07/20 1441 06/07/20 1442   06/07/20 1600  meropenem (MERREM) 1 g in sodium chloride 0.9 % 100 mL IVPB  Status:  Discontinued        1 g 200 mL/hr over 30 Minutes Intravenous Every 8 hours 06/07/20 1442 06/08/20 0845   06/06/20 1630  vancomycin (VANCOREADY) IVPB 1500 mg/300 mL  Status:  Discontinued        1,500 mg 150 mL/hr over 120 Minutes Intravenous  Once 06/06/20 1533 06/06/20 1632   06/05/20  0600  cefTRIAXone (ROCEPHIN) 2 g in sodium chloride 0.9 % 100 mL IVPB  Status:  Discontinued        2 g 200 mL/hr over 30  Minutes Intravenous Every 24 hours 06/04/20 2029 06/07/20 1439   06/04/20 2200  azithromycin (ZITHROMAX) 500 mg in sodium chloride 0.9 % 250 mL IVPB        500 mg 250 mL/hr over 60 Minutes Intravenous Every 24 hours 06/04/20 2029 06/07/20 0003   06/04/20 1915  ceFEPIme (MAXIPIME) 2 g in sodium chloride 0.9 % 100 mL IVPB        2 g 200 mL/hr over 30 Minutes Intravenous  Once 06/04/20 1906 06/04/20 2055   06/04/20 1915  metroNIDAZOLE (FLAGYL) IVPB 500 mg        500 mg 100 mL/hr over 60 Minutes Intravenous  Once 06/04/20 1906 06/04/20 2055   06/04/20 1915  vancomycin (VANCOCIN) IVPB 1000 mg/200 mL premix  Status:  Discontinued        1,000 mg 200 mL/hr over 60 Minutes Intravenous  Once 06/04/20 1906 06/04/20 1912   06/04/20 1915  vancomycin (VANCOREADY) IVPB 1750 mg/350 mL        1,750 mg 175 mL/hr over 120 Minutes Intravenous  Once 06/04/20 1912 06/04/20 2257         Micro    Objective   Vitals:   07/05/20 0908 07/05/20 1247 07/05/20 1249 07/05/20 1601  BP: (!) 167/83 (!) 147/77  135/69  Pulse:    91  Resp: _0 Temp: 98.1 F (36.7 C) 97.8 F (36.6 C) (!) 97.5 F (36.4 C) 98.4 F (36.9 C)  TempSrc: Oral Axillary Oral   SpO2:    100%  Weight:      Height:        Intake/Output Summary (Last 24 hours) at 07/05/2020 1630 Last data filed at 07/05/2020 1524 Gross per 24 hour  Intake 4125.83 ml  Output 2450 ml  Net 1675.83 ml   Filed Weights   06/30/20 0500 07/01/20 0500 07/02/20 0310  Weight: 89.4 kg 60.4 kg 86.2 kg    Physical Exam:  General exam: sleeping comfortably, wakes just briefly, no acute distress Respiratory system: CTAB, normal respiratory effort, on rooma air, no wheezes or rhonchi heard. Cardiovascular system: normal S1/S2, RRR, no peripheral edema   Gastrointestinal system: soft, NT, ND G-tube present with continuous tube feeds  running. Skin: dry, intact, normal temperature  Extremities: no LE edema, offloading boots on feet   Labs   Data Reviewed: I have personally reviewed following labs and imaging studies  CBC: Recent Labs  Lab 06/29/20 0500 06/30/20 0431 07/01/20 0742  WBC 10.1 6.8 5.0  NEUTROABS 8.2* 5.0 3.3  HGB 8.3* 8.3* 9.2*  HCT 25.8* 25.7* 28.5*  MCV 92.1 92.1 90.2  PLT 257 250 563   Basic Metabolic Panel: Recent Labs  Lab 06/29/20 0500 06/30/20 0431 07/01/20 0742 07/01/20 0747 07/02/20 0428 07/03/20 0530 07/04/20 0500 07/05/20 0410  NA 145 145  --  150* 150* 151* 151* 151*  K 4.0 3.8  --  4.3 3.6 3.8 3.7 3.9  CL 109 109  --  113* 114* 115* 118* 116*  CO2 23 24  --  _1 GLUCOSE 153* 182*  --  152* 202* 134* 137* 147*  BUN 164* 163*  --  157* 136* 137* 136* 130*  CREATININE 6.09* 5.54*  --  5.47* 5.44* 4.88* 4.52* 4.10*  CALCIUM 10.0 10.5*  --  10.8* 10.4* 10.0 9.8 10.0  MG 2.9* 3.0* 3.0*  --   --   --   --  2.5*  PHOS 7.7* 7.8*  --  7.1*  --   --   --  6.3*   GFR: Estimated Creatinine Clearance: 19.5 mL/min (A) (by C-G formula based on SCr of 4.1 mg/dL (H)). Liver Function Tests: Recent Labs  Lab 07/01/20 0747  ALBUMIN 2.8*   No results for input(s): LIPASE, AMYLASE in the last 168 hours. No results for input(s): AMMONIA in the last 168 hours. Coagulation Profile: No results for input(s): INR, PROTIME in the last 168 hours. Cardiac Enzymes: No results for input(s): CKTOTAL, CKMB, CKMBINDEX, TROPONINI in the last 168 hours. BNP (last 3 results) No results for input(s): PROBNP in the last 8760 hours. HbA1C: No results for input(s): HGBA1C in the last 72 hours. CBG: Recent Labs  Lab 07/05/20 0031 07/05/20 0410 07/05/20 0730 07/05/20 1245 07/05/20 1604  GLUCAP 171* 135* 107* 182* 109*   Lipid Profile: No results for input(s): CHOL, HDL, LDLCALC, TRIG, CHOLHDL, LDLDIRECT in the last 72 hours. Thyroid Function Tests: No results for input(s): TSH,  T4TOTAL, FREET4, T3FREE, THYROIDAB in the last 72 hours. Anemia Panel: No results for input(s): VITAMINB12, FOLATE, FERRITIN, TIBC, IRON, RETICCTPCT in the last 72 hours. Sepsis Labs: Recent Labs  Lab 06/28/20 1705 06/28/20 2130 06/29/20 0500 06/30/20 0431  PROCALCITON 0.34  --  1.28 1.04  LATICACIDVEN 1.6 0.4*  --   --     Recent Results (from the past 240 hour(s))  Urine Culture     Status: Abnormal   Collection Time: 06/28/20  4:13 PM   Specimen: Urine, Random  Result Value Ref Range Status   Specimen Description URINE, RANDOM  Final   Special Requests NONE  Final   Culture >=100,000 COLONIES/mL YEAST (A)  Final   Report Status 07/01/2020 FINAL  Final  CULTURE, BLOOD (ROUTINE X 2) w Reflex to ID Panel     Status: None   Collection Time: 06/28/20  5:05 PM   Specimen: BLOOD  Result Value Ref Range Status   Specimen Description BLOOD RIGHT HAND  Final   Special Requests   Final    BOTTLES DRAWN AEROBIC AND ANAEROBIC Blood Culture results may not be optimal due to an excessive volume of blood received in culture bottles   Culture   Final    NO GROWTH 5 DAYS Performed at [facility], [address] [place], [place] [address]    Report Status 07/03/2020 FINAL  Final  CULTURE, BLOOD (ROUTINE X 2) w Reflex to ID Panel     Status: None   Collection Time: 06/28/20  5:05 PM   Specimen: BLOOD  Result Value Ref Range Status   Specimen Description BLOOD LEFT ANTECUBITAL  Final   Special Requests   Final    BOTTLES DRAWN AEROBIC AND ANAEROBIC Blood Culture adequate volume   Culture   Final    NO GROWTH 5 DAYS Performed at [facility], [address] [place], [place] [address]    Report Status 07/03/2020 FINAL  Final  MRSA Next Gen by PCR, Nasal     Status: None   Collection Time: 06/29/20  5:10 PM  Result Value Ref Range Status   MRSA by PCR Next Gen NOT DETECTED NOT DETECTED Final    Comment: (NOTE) The GeneXpert MRSA Assay (FDA approved for NASAL  specimens only), is one component of a comprehensive MRSA colonization surveillance program. It is not intended to diagnose MRSA infection nor to guide or monitor treatment for MRSA infections. Test performance is not FDA approved in patients less than 28 years old. Performed at [facility], [place], [place]  [address]       Imaging Studies   No results found.   Medications   Scheduled Meds:  amLODipine  5 mg Per Tube Daily   chlorhexidine gluconate (MEDLINE KIT)  15 mL Mouth Rinse BID   Chlorhexidine Gluconate Cloth  6 each Topical Daily   cloNIDine  0.3 mg Transdermal Weekly   free water  200 mL Per Tube Q2H   gabapentin  100 mg Per Tube Q8H   heparin injection (subcutaneous)  5,000 Units Subcutaneous Q8H   insulin aspart  0-20 Units Subcutaneous Q4H   insulin aspart  3 Units Subcutaneous Q4H   insulin glargine  20 Units Subcutaneous Daily   metoprolol tartrate  25 mg Per Tube BID   multivitamin  1 tablet Per Tube QHS   nutrition supplement (JUVEN)  1 packet Per Tube BID   pantoprazole sodium  40 mg Per Tube Daily   prazosin  4 mg Per Tube QHS   QUEtiapine  12.5 mg Per Tube BID   sodium chloride flush  10-40 mL Intracatheter Q12H   traZODone  150 mg Per Tube QHS   Continuous Infusions:  sodium chloride Stopped (06/22/20 1614)   feeding supplement (NEPRO CARB STEADY) 1,000 mL (07/05/20 0429)       LOS: 31 days    Time spent: 25 minutes with > 50% spent at bedside and in coordination of care.     Ezekiel Slocumb, DO [facility]  07/05/2020, 4:30 PM      If 7PM-7AM, please contact night-coverage. How to contact the [facility] Attending or Consulting provider [place] or covering provider during after hours [place], for this patient?    Check the care team in [facility] and look for a) attending/consulting TRH provider listed and b) the [facility] team listed Log into [email or web address] and use [place] universal password to access. If you do not  have the password, please contact the hospital operator. Locate the [facility] provider you are looking for under [facility] and page to a number that you can be directly reached. If you still have difficulty reaching the provider, please page the [facility] (Director on Call) for the Hospitalists listed on amion for assistance.

## 2020-07-05 NOTE — TOC Progression Note (Signed)
Transition of Care (TOC) - Progression Note    Patient Details  Name: Wyatt Ball. MRN: [id number] Date of Birth: 04-16-1960  Transition of Care [facility]) CM/SW Contact  Caryn Section, RN Phone Number: 07/05/2020, 8:53 AM  Clinical Narrative:  RNCM left voice mails for referral department and social work department again today.  Reference number [id number] phone [phone] SNF benefits started 05/30/2017 Local center number:[phone] ext [phone] or press 0       Expected Discharge Plan: Skilled Nursing Facility Barriers to Discharge: Continued Medical Work up  Expected Discharge Plan and Services Expected Discharge Plan: Skilled Nursing Facility In-house Referral: NA   Post Acute Care Choice: Skilled Nursing Facility Living arrangements for the past 2 months: Single Family Home                                       Social Determinants of Health (SDOH) Interventions    Readmission Risk Interventions No flowsheet data found.

## 2020-07-05 NOTE — Evaluation (Signed)
Occupational Therapy Re-Evaluation Patient Details Name: Wyatt Ball. MRN: [id number] DOB: [birth year] Today's Date: 07/05/2020    History of Present Illness Wyatt Ball. is a 60 y.o. 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  Since hospitalization pt has been intubated, finally extubated 6/15.   Clinical Impression   Wyatt Ball was seen for OT re-evaluation on this date, per chart pt demonstrating improved mentation. Pt is A&Ox1 self only, responds to hello with "hi" and follows 1 step command to squeeze hand. Upon arrival to room pt noted to have BM in bed. Pt has decreased level of arousal this session limiting participation. Pt requires TOTAL A for toileting at bed level. Pt able to maintain grip on raililng to assist with maintaining side lying but requires assist to replace as pt fatigues. MAX A oral hygiene at bed level - pt opens mouth for sponge and demonstrates purposeful oral movement.   Of note, pt reported to have improved arousal in PM. Pt would benefit from skilled OT to address noted impairments and functional limitations (see below for any additional details) in order to maximize safety and independence while minimizing falls risk and caregiver burden. Upon hospital discharge, recommend LTACH.     Follow Up Recommendations  LTACH    Equipment Recommendations  Other (comment) (TBD)    Recommendations for Other Services       Precautions / Restrictions Precautions Precautions: Fall Restrictions Weight Bearing Restrictions: No      Mobility Bed Mobility Overal bed mobility: Needs Assistance Bed Mobility:  Rolling Rolling: Total assist                                        ADL either performed or assessed with clinical judgement   ADL Overall ADL's : Needs assistance/impaired                                       General ADL Comments: TOTAL A for toileting at bed level. Pt able to maintain grip on raililng to assist with maintaining side lying but requires assist to replace as pt fatigues. MAX A oral hygiene at bed level - pt opens mouth for sponge and demonstrates oral movement. Pt responds to hello with "hi" and follows 1 step command to squeeze hand.      Pertinent Vitals/Pain Pain Assessment: Faces Faces Pain Scale: Hurts little more Pain Location: pt states "help" as only verbalization during session. Unable to localize or meaningfully communicate during session. Pain Descriptors / Indicators: Grimacing Pain Intervention(s): Repositioned     Hand Dominance     Extremity/Trunk Assessment Upper Extremity Assessment Upper Extremity Assessment: Generalized weakness   Lower Extremity Assessment Lower Extremity Assessment: Generalized weakness       Communication     Cognition Arousal/Alertness: Lethargic Behavior During Therapy: Flat affect Overall Cognitive Status: Difficult to assess  General Comments: Pt unable to engage meaningfully with therapist during session. Does not follow VCs      Exercises Exercises: Other exercises Other Exercises Other Exercises: Toileting, oral care, L+R rolling, bed mobility. OT facilitates bed-level grooming tasks including oral care (with sponge/oral gel), hair combing, Pt requires TOTAL assist.          OT Problem List: Decreased strength;Decreased range of motion;Decreased activity tolerance;Decreased coordination;Decreased cognition      OT Treatment/Interventions: Self-care/ADL training;DME and/or AE instruction;Therapeutic activities;Balance  training;Therapeutic exercise;Energy conservation;Patient/family education;Manual therapy    OT Goals(Current goals can be found in the care plan section) Acute Rehab OT Goals Patient Stated Goal: unable to state OT Goal Formulation: Patient unable to participate in goal setting Time For Goal Achievement: 07/19/20 Potential to Achieve Goals: Poor ADL Goals Pt Will Perform Grooming: with mod assist;bed level Pt Will Transfer to Toilet: with mod assist;with +2 assist (rolling bed level) Additional ADL Goal #1: Pt will follow ~50% of one step commands with MIN cues and increased time to process.  OT Frequency: Min 1X/week    AM-PAC OT "6 Clicks" Daily Activity     Outcome Measure Help from another person eating meals?: Total Help from another person taking care of personal grooming?: Total Help from another person toileting, which includes using toliet, bedpan, or urinal?: Total Help from another person bathing (including washing, rinsing, drying)?: Total Help from another person to put on and taking off regular upper body clothing?: Total Help from another person to put on and taking off regular lower body clothing?: Total 6 Click Score: 6   End of Session Nurse Communication: Mobility status  Activity Tolerance: Patient tolerated treatment well Patient left: in bed;with call bell/phone within reach;with bed alarm set  OT Visit Diagnosis: Other symptoms and signs involving cognitive function                Time: 1020-1100 OT Time Calculation (min): 40 min Charges:  OT General Charges $OT Visit: 1 Visit OT Evaluation $OT Re-eval: 1 Re-eval OT Treatments $Self Care/Home Management : 23-37 mins  Kathie Dike, M.S. OTR/L  07/05/20, 3:23 PM  ascom [phone]

## 2020-07-05 NOTE — TOC Progression Note (Signed)
Transition of Care (TOC) - Progression Note    Patient Details  Name: Wyatt Ball. MRN: [id number] Date of Birth: [birth year]  Transition [facility] Care [facility]) CM/SW Contact  Caryn Section, RN Phone Number: 07/05/2020, 1:03 PM  Clinical Narrative:   [facility] and [facility].  New caseworker is Laqueta Carina [phone] extension [place], fax [phone] Dr Jerene Dilling Bolinger.  Awaiting response from [place] for SNF placement, family aware.  TOC to follow to discharge.    Expected Discharge Plan: Skilled Nursing Facility Barriers to Discharge: Continued Medical Work up  Expected Discharge Plan and Services Expected Discharge Plan: Skilled Nursing Facility In-house Referral: NA   Post Acute Care Choice: Skilled Nursing Facility Living arrangements for the past 2 months: Single Family Home                                       Social Determinants of Health (SDOH) Interventions    Readmission Risk Interventions No flowsheet data found.

## 2020-07-06 DIAGNOSIS — N319 Neuromuscular dysfunction of bladder, unspecified: Secondary | ICD-10-CM | POA: Diagnosis not present

## 2020-07-06 DIAGNOSIS — E119 Type 2 diabetes mellitus without complications: Secondary | ICD-10-CM | POA: Diagnosis not present

## 2020-07-06 DIAGNOSIS — G9341 Metabolic encephalopathy: Secondary | ICD-10-CM | POA: Diagnosis not present

## 2020-07-06 LAB — FUNGAL ORGANISM REFLEX

## 2020-07-06 LAB — BASIC METABOLIC PANEL
Anion gap: 10 (ref 5–15)
BUN: 124 mg/dL — ABNORMAL HIGH (ref 8–23)
CO2: 25 mmol/L (ref 22–32)
Calcium: 9.6 mg/dL (ref 8.9–10.3)
Chloride: 110 mmol/L (ref 98–111)
Creatinine, Ser: 3.79 mg/dL — ABNORMAL HIGH (ref 0.61–1.24)
GFR, Estimated: 17 mL/min — ABNORMAL LOW (ref >60)
Glucose, Bld: 148 mg/dL — ABNORMAL HIGH (ref 70–99)
Potassium: 3.5 mmol/L (ref 3.5–5.1)
Sodium: 145 mmol/L (ref 135–145)

## 2020-07-06 LAB — GLUCOSE, CAPILLARY
Glucose-Capillary: 107 mg/dL — ABNORMAL HIGH (ref 70–99)
Glucose-Capillary: 153 mg/dL — ABNORMAL HIGH (ref 70–99)
Glucose-Capillary: 116 mg/dL — ABNORMAL HIGH (ref 70–99)
Glucose-Capillary: 160 mg/dL — ABNORMAL HIGH (ref 70–99)
Glucose-Capillary: 125 mg/dL — ABNORMAL HIGH (ref 70–99)

## 2020-07-06 LAB — FUNGUS CULTURE RESULT

## 2020-07-06 LAB — FUNGUS CULTURE WITH STAIN

## 2020-07-06 MED ORDER — FREE WATER
150.0000 mL | Status: DC
  Administered 2020-07-06 – 2020-07-07 (×16): 150 mL

## 2020-07-06 NOTE — Evaluation (Signed)
Physical Therapy Re-Evaluation Patient Details Name: Wyatt Ball. MRN: [id number] DOB: 31-Mar-1960 Today's Date: 07/06/2020   History of Present Illness  Wyatt Ball. is a 60 y.o. 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  Since hospitalization pt has been intubated, finally extubated 6/15.  Clinical Impression  Pt seen for PT re-evaluation with pt received in bed awake & alert but with minimal communication as pt only mouths words with no voice heard & PT only able to make out 1 sentence. Pt follows simple one step commands correctly <15% of the time during session. Pt voluntarily moves LUE only during session, does not follow commands to attempt to move BLE. Pt requires total assist for supine<>sit and total assist for static sitting EOB. PT performs BLE ROM to all joints with no tightness noted, but pt not participating in tasks either. Pt would benefit from acute PT services to address strength, balance, endurance, bed mobility, and transfers as able. Pt would benefit from STR upon d/c to maximize independence with functional mobility &r educe fall risk.  PT goals were reviewed and remain appropriate at this time, date updated to reflect new re-evaluation on this date.      Follow Up Recommendations SNF    Equipment Recommendations   (TBD in next venue)    Recommendations for Other Services       Precautions / Restrictions Precautions Precautions: Fall Restrictions Weight Bearing Restrictions: No      Mobility  Bed Mobility Overal bed mobility: Needs Assistance Bed Mobility: Supine to Sit;Sit to Supine     Supine to sit:  Total assist;HOB elevated Sit to supine: Total assist;HOB elevated   General bed mobility comments: Pt does not participate in movement despite cuing    Transfers                    Ambulation/Gait                Stairs            Wheelchair Mobility    Modified Rankin (Stroke Patients Only)       Balance Overall balance assessment: Needs assistance Sitting-balance support: Feet unsupported;Bilateral upper extremity supported Sitting balance-Leahy Scale: Zero Sitting balance - Comments: max assist EOB                                     Pertinent Vitals/Pain Pain Assessment: Faces Faces Pain Scale: Hurts a little bit Pain Location: when PT performing ROM to LLE & assisting with supine>sit Pain Descriptors / Indicators: Grimacing Pain Intervention(s): Repositioned;Monitored during session    Home Living Family/patient expects to be discharged to:: Unsure                 Additional Comments: Pt unable to communicate effectively to for history taking.    Prior Function           Comments: Pt could not formulate words, showed inability to engage with any conversation, unknown PLOF     Hand Dominance        Extremity/Trunk Assessment   Upper Extremity  Assessment Upper Extremity Assessment:  (Pt able to flex L shoulder but requires max cuing to mov RLE & then only flexes wrist, no shoulder flexion noted.)    Lower Extremity Assessment Lower Extremity Assessment:  (Pt does not show any volitional movement of BLE; PT performs PROM and pt with very lose joints, no tightness noted)       Communication   Communication:  (Pt intermittently mouths responses, no voice heard; PT only able to make out pt mouthing "I've been better")  Cognition Arousal/Alertness: Awake/alert Behavior During Therapy: Flat affect Overall Cognitive Status: Difficult to assess                                 General Comments:  Limited communication with pt only able mouth words, PT does not observe pt stating name. Pt follows multimodal cuing for simple one step commands correctly <15% of the time.      General Comments      Exercises     Assessment/Plan    PT Assessment Patient needs continued PT services  PT Problem List Decreased strength;Decreased range of motion;Decreased activity tolerance;Decreased balance;Decreased mobility;Decreased coordination;Decreased cognition;Decreased safety awareness;Decreased knowledge of use of DME;Cardiopulmonary status limiting activity       PT Treatment Interventions DME instruction;Gait training;Stair training;Functional mobility training;Therapeutic activities;Therapeutic exercise;Balance training;Neuromuscular re-education;Cognitive remediation;Patient/family education;Wheelchair mobility training;Modalities    PT Goals (Current goals can be found in the Care Plan section)  Acute Rehab PT Goals Patient Stated Goal: unable to state PT Goal Formulation: Patient unable to participate in goal setting Time For Goal Achievement: 07/20/20 Potential to Achieve Goals: Fair    Frequency Min 2X/week   Barriers to discharge Decreased caregiver support;Inaccessible home environment      Co-evaluation               AM-PAC PT "6 Clicks" Mobility  Outcome Measure Help needed turning from your back to your side while in a flat bed without using bedrails?: Total Help needed moving from lying on your back to sitting on the side of a flat bed without using bedrails?: Total Help needed moving to and from a bed to a chair (including a wheelchair)?: Total Help needed standing up from a chair using your arms (e.g., wheelchair or bedside chair)?: Total Help needed to walk in hospital room?: Total Help needed climbing 3-5 steps with a railing? : Total 6 Click Score: 6    End of Session   Activity Tolerance: Patient tolerated treatment well Patient left: in bed;with bed  alarm set;with SCD's reapplied (BLE Prevalon boots donned)   PT Visit Diagnosis: Unsteadiness on feet (R26.81);Muscle weakness (generalized) (M62.81);Difficulty in walking, not elsewhere classified (R26.2);Adult, failure to thrive (R62.7)    Time: 7510-2585 PT Time Calculation (min) (ACUTE ONLY): 10 min   Charges:   PT Evaluation $PT Re-evaluation: 1 Re-eval          Aleda Grana, PT, DPT 07/06/20, 10:09 AM   Sandi Mariscal 07/06/2020, 10:06 AM

## 2020-07-06 NOTE — Plan of Care (Signed)
End of Shift Summary:  Alert to self. VSS on room air. Continuous tube feeds maintained with q2h flush. Labs drawn from PICC. Heels floated in prevalon boots. Sacral foam dressing to pressure injury changed. Q2T. Oral care done q4h. (+) BM. Urine output adequate via foley catheter. Pt remained free from falls or injury. Bed low and in locked position. Intentional rounding to anticipate needs in lieu of call bell.   Problem: Clinical Measurements: Goal: Ability to maintain clinical measurements within normal limits will improve Outcome: Progressing Goal: Will remain free from infection Outcome: Progressing Goal: Diagnostic test results will improve Outcome: Progressing Goal: Respiratory complications will improve Outcome: Progressing Goal: Cardiovascular complication will be avoided Outcome: Progressing   Problem: Activity: Goal: Risk for activity intolerance will decrease Outcome: Progressing   Problem: Nutrition: Goal: Adequate nutrition will be maintained Outcome: Progressing   Problem: Elimination: Goal: Will not experience complications related to bowel motility Outcome: Progressing Goal: Will not experience complications related to urinary retention Outcome: Progressing   Problem: Pain Managment: Goal: General experience of comfort will improve Outcome: Progressing   Problem: Skin Integrity: Goal: Risk for impaired skin integrity will decrease Outcome: Progressing   Problem: Safety: Goal: Ability to remain free from injury will improve Outcome: Progressing

## 2020-07-06 NOTE — Progress Notes (Signed)
PROGRESS NOTE    Wyatt Ball.   LYY:[id number]  DOB: August 13, 1960  PCP: System, Provider Not In    DOA: 06/04/2020 LOS: 32   Assessment & Plan   Principal Problem:   CAP (community acquired pneumonia) Active Problems:   Acute metabolic encephalopathy   Severe sepsis with septic shock ([place])   Neurogenic bladder   Chronic, continuous use of opioids   Chronic low back pain   Type 2 diabetes mellitus without complication (HCC)   Migraines   Recurrent UTI   History of colon polyps   Urinary retention   Altered mental status   Pressure injury of skin   AKI (acute kidney injury) ([place])   Septic shock secondary to community-acquired pneumonia: Status post 8 days azithromycin, 3 days of cephalosporin plus 5 days of IV meropenem followed by 7 days of Levaquin and doxycycline.   S/p bronchoscopy blood cultures were negative.   No growth on blood cultures . Monitor closely.   New fever and tachycardia on 06/28/2020, new sepsis secondary to bilateral pneumonia: MRSA screen was negative.  No growth on repeat blood cultures.  He was treated with IV vancomycin from 06/28/2020 through 06/30/2020 and IV meropenem from 06/28/2020 through 07/01/2020.  Urine culture from 06/28/2020 showed yeast.  Of note, patient had previously been treated with IV fluconazole from 06/25/2020 through 06/30/2020.   Acute hypoxic respiratory failure: Due to PNA. Resolved.  S/p intubation and extubation.     AKI, hyperphosphatemia: stable, Cr remains up.  Good UO.  Hemodialysis was initiated on 06/10/2020, now off and HD catheter removed. No plan for further hemodialysis.  He is not a good candidate for long-term dialysis per nephrologist.     Hypernatremia: Continue IV 5% dextrose infusion for hypernatremia.  Monitor BMP.   7/2 - Na finally normalized 145 (was 151 several days) Reduce free water flushes slightly to 150 cc L2X   Acute metabolic encephalopathy, agitation, history of PTSD: Patient has been  evaluated by neurologist.  EEG was unremarkable.   --Continue psychotropics and prazosin   Inadequate PO intake due to Dysphagia - inability to eat due to acute prolonged illness as above. --G tube placed 07/03/20 --Tube feeds continuous, per RD orders --NPO status  Hypertension: Continue antihypertensives   Stage II coccygeal decubitus ulcer: Present on admission: Continue foam dressing.  Frequent repositioning. Monitor closely. Pressure Injury 06/23/20 Coccyx Posterior;Mid Stage 2 -  Partial thickness loss of dermis presenting as a shallow open injury with a red, pink wound bed without slough. (Active)  06/23/20 1400  Location: Coccyx  Location Orientation: Posterior;Mid  Staging: Stage 2 -  Partial thickness loss of dermis presenting as a shallow open injury with a red, pink wound bed without slough.  Wound Description (Comments):   Present on Admission: Yes     Hyperkalemia: Resolved   Neurogenic bladder due to hx of transverse myelitis  Chronic pain syndrome on opioids   Obesity: Body mass index is 29.76 kg/m.  Complicates overall care and prognosis.  Recommend lifestyle modifications including physical activity and diet for weight loss and overall long-term health.   DVT prophylaxis: heparin injection 5,000 Units Start: 06/10/20 2200   Diet:     Code Status: DNR   Brief Narrative / Hospital Course to Date:   Medical records reviewed and are as summarized below:  Wyatt Cregg Tullos Brooke Bonito. is a 60 y.o. male with medical history significant for hypertension, PTSD, diabetes, migraine, GERD, neurogenic bladder secondary to remote history of transverse myelitis requiring  self urethral catheterization, frequent UTIs, chronic back pain on opioids, generally independent at baseline.  He usually gets his care at the [facility] hospital.  He was brought to the emergency room because of altered mental status, cough, shortness of breath, fever, vomiting and diarrhea.   He was admitted to  the hospital for sepsis secondary to pneumonia.  He was transferred to the ICU for septic shock, AKI, COPD exacerbation.  He was intubated and placed on mechanical ventilation.  He was treated with empiric IV antibiotics including IV fluconazole for yeast in sputum, IV fluids.  NG tube was placed for enteral nutrition and medications because of severe dysphagia.  Hemodialysis was discontinued because he is not a candidate for long-term hemodialysis per nephrologist.   5/21 admitted for sepsis and pneumonia. 6/2 transferred to ICU, intubated underwent bronchoscopy 6/3 self extubated followed by reintubated 6/7 HD started 6/15 extubated 6/19 transferred to [facility]. Neurology, nephrology, urology and infectious disease consulted.     After transfer to progressive care unit, he developed fever and tachycardia that was concerning for sepsis on 06/28/2020.  He was restarted on IV antibiotics (meropenem and vancomycin).  Repeat blood culture did not show any growth.  Gastroenterologist was consulted for PEG tube placement per family's request.    Subjective 07/06/20    Pt awake sitting up, but not verbally communicative.  Following commands fine.  Shakes head no when asked if pain or anything bothering him but that was only answer given to questions.   Disposition Plan & Communication   Status is: Inpatient  Remains inpatient appropriate because:Inpatient level of care appropriate due to severity of illness with ongoing electrolyte derangements and encephalopathy which is very slowly improving.  Dispo: The patient is from: Home              Anticipated d/c is to: SNF vs LTAC              Patient currently is not medically stable to d/c.   Difficult to place patient No   Consults, Procedures, Significant Events   Consultants:  Gastroenterology Palliative care Nephrology  Procedures:  As above  Antimicrobials:  Anti-infectives (From admission, onward)    Start     Dose/Rate Route  Frequency Ordered Stop   06/28/20 1815  vancomycin (VANCOREADY) IVPB 1750 mg/350 mL        1,750 mg 175 mL/hr over 120 Minutes Intravenous  Once 06/28/20 1726 06/28/20 2054   06/28/20 1815  meropenem (MERREM) 1 g in sodium chloride 0.9 % 100 mL IVPB  Status:  Discontinued        1 g 200 mL/hr over 30 Minutes Intravenous Every 12 hours 06/28/20 1726 07/01/20 1558   06/28/20 1742  vancomycin variable dose per unstable renal function (pharmacist dosing)  Status:  Discontinued         Does not apply See admin instructions 06/28/20 1742 06/30/20 0757   06/26/20 2100  fluconazole (DIFLUCAN) IVPB 50 mg       See Hyperspace for full Linked Orders Report.   50 mg 25 mL/hr over 60 Minutes Intravenous Every 24 hours 06/25/20 1835 06/30/20 2118   06/25/20 2000  fluconazole (DIFLUCAN) IVPB 200 mg       See Hyperspace for full Linked Orders Report.   200 mg 100 mL/hr over 60 Minutes Intravenous  Once 06/25/20 1835 06/25/20 2348   06/14/20 1800  levofloxacin (LEVAQUIN) IVPB 500 mg        500 mg 100 mL/hr over 60  Minutes Intravenous Every 48 hours 06/12/20 1624 06/19/20 0210   06/12/20 1800  levofloxacin (LEVAQUIN) IVPB 750 mg        750 mg 100 mL/hr over 90 Minutes Intravenous  Once 06/11/20 2239 06/12/20 1904   06/11/20 1800  meropenem (MERREM) 500 mg in sodium chloride 0.9 % 100 mL IVPB  Status:  Discontinued        500 mg 200 mL/hr over 30 Minutes Intravenous Every 24 hours 06/10/20 1444 06/11/20 2231   06/10/20 1300  doxycycline (VIBRAMYCIN) 100 mg in sodium chloride 0.9 % 250 mL IVPB  Status:  Discontinued        100 mg 125 mL/hr over 120 Minutes Intravenous Every 12 hours 06/10/20 1149 06/17/20 1704   06/09/20 1015  meropenem (MERREM) 500 mg in sodium chloride 0.9 % 100 mL IVPB        500 mg 200 mL/hr over 30 Minutes Intravenous Every 12 hours 06/09/20 0922 06/11/20 1203   06/08/20 2200  meropenem (MERREM) 1 g in sodium chloride 0.9 % 100 mL IVPB  Status:  Discontinued        1 g 200 mL/hr  over 30 Minutes Intravenous Every 12 hours 06/08/20 0845 06/09/20 0922   06/07/20 2200  azithromycin (ZITHROMAX) 500 mg in sodium chloride 0.9 % 250 mL IVPB        500 mg 250 mL/hr over 60 Minutes Intravenous Every 24 hours 06/07/20 1441 06/11/20 2211   06/07/20 1600  ceFEPIme (MAXIPIME) 2 g in sodium chloride 0.9 % 100 mL IVPB  Status:  Discontinued        2 g 200 mL/hr over 30 Minutes Intravenous Every 8 hours 06/07/20 1441 06/07/20 1442   06/07/20 1600  meropenem (MERREM) 1 g in sodium chloride 0.9 % 100 mL IVPB  Status:  Discontinued        1 g 200 mL/hr over 30 Minutes Intravenous Every 8 hours 06/07/20 1442 06/08/20 0845   06/06/20 1630  vancomycin (VANCOREADY) IVPB 1500 mg/300 mL  Status:  Discontinued        1,500 mg 150 mL/hr over 120 Minutes Intravenous  Once 06/06/20 1533 06/06/20 1632   06/05/20 0600  cefTRIAXone (ROCEPHIN) 2 g in sodium chloride 0.9 % 100 mL IVPB  Status:  Discontinued        2 g 200 mL/hr over 30 Minutes Intravenous Every 24 hours 06/04/20 2029 06/07/20 1439   06/04/20 2200  azithromycin (ZITHROMAX) 500 mg in sodium chloride 0.9 % 250 mL IVPB        500 mg 250 mL/hr over 60 Minutes Intravenous Every 24 hours 06/04/20 2029 06/07/20 0003   06/04/20 1915  ceFEPIme (MAXIPIME) 2 g in sodium chloride 0.9 % 100 mL IVPB        2 g 200 mL/hr over 30 Minutes Intravenous  Once 06/04/20 1906 06/04/20 2055   06/04/20 1915  metroNIDAZOLE (FLAGYL) IVPB 500 mg        500 mg 100 mL/hr over 60 Minutes Intravenous  Once 06/04/20 1906 06/04/20 2055   06/04/20 1915  vancomycin (VANCOCIN) IVPB 1000 mg/200 mL premix  Status:  Discontinued        1,000 mg 200 mL/hr over 60 Minutes Intravenous  Once 06/04/20 1906 06/04/20 1912   06/04/20 1915  vancomycin (VANCOREADY) IVPB 1750 mg/350 mL        1,750 mg 175 mL/hr over 120 Minutes Intravenous  Once 06/04/20 1912 06/04/20 2257         Micro  Objective   Vitals:   07/06/20 0453 07/06/20 0500 07/06/20 0817 07/06/20 1652   BP: 126/72  (!) 149/73 (!) 159/83  Pulse: 89  82 95  Resp: 15  20 16   Temp: 98.2 F (36.8 C)  98.4 F (36.9 C) 98.3 F (36.8 C)  TempSrc: Axillary     SpO2: 97%  99% 100%  Weight:  83.6 kg    Height:        Intake/Output Summary (Last 24 hours) at 07/06/2020 1759 Last data filed at 07/06/2020 6283 Gross per 24 hour  Intake 2535.83 ml  Output 2250 ml  Net 285.83 ml   Filed Weights   07/01/20 0500 07/02/20 0310 07/06/20 0500  Weight: 60.4 kg 86.2 kg 83.6 kg    Physical Exam:  General exam: awake and alert, sitting upright in bed, no acute distress, nonverbal Respiratory system: CTAB, no wheezes or rhonchi, on room air. Cardiovascular system: normal S1/S2, RRR, no peripheral edema   Gastrointestinal system: soft, NT, ND G-tube present with continuous tube feeds running. Extremities: no LE edema, offloading boots on feet   Labs   Data Reviewed: I have personally reviewed following labs and imaging studies  CBC: Recent Labs  Lab 06/30/20 0431 07/01/20 0742  WBC 6.8 5.0  NEUTROABS 5.0 3.3  HGB 8.3* 9.2*  HCT 25.7* 28.5*  MCV 92.1 90.2  PLT 250 151   Basic Metabolic Panel: Recent Labs  Lab 06/30/20 0431 07/01/20 0742 07/01/20 0747 07/02/20 0428 07/03/20 0530 07/04/20 0500 07/05/20 0410 07/06/20 0530  NA 145  --  150* 150* 151* 151* 151* 145  K 3.8  --  4.3 3.6 3.8 3.7 3.9 3.5  CL 109  --  113* 114* 115* 118* 116* 110  CO2 24  --  23 25 26 25 26 25   GLUCOSE 182*  --  152* 202* 134* 137* 147* 148*  BUN 163*  --  157* 136* 137* 136* 130* 124*  CREATININE 5.54*  --  5.47* 5.44* 4.88* 4.52* 4.10* 3.79*  CALCIUM 10.5*  --  10.8* 10.4* 10.0 9.8 10.0 9.6  MG 3.0* 3.0*  --   --   --   --  2.5*  --   PHOS 7.8*  --  7.1*  --   --   --  6.3*  --    GFR: Estimated Creatinine Clearance: 20.8 mL/min (A) (by C-G formula based on SCr of 3.79 mg/dL (H)). Liver Function Tests: Recent Labs  Lab 07/01/20 0747  ALBUMIN 2.8*   No results for input(s): LIPASE, AMYLASE in  the last 168 hours. No results for input(s): AMMONIA in the last 168 hours. Coagulation Profile: No results for input(s): INR, PROTIME in the last 168 hours. Cardiac Enzymes: No results for input(s): CKTOTAL, CKMB, CKMBINDEX, TROPONINI in the last 168 hours. BNP (last 3 results) No results for input(s): PROBNP in the last 8760 hours. HbA1C: No results for input(s): HGBA1C in the last 72 hours. CBG: Recent Labs  Lab 07/05/20 2328 07/06/20 0452 07/06/20 0818 07/06/20 1230 07/06/20 1653  GLUCAP 143* 107* 153* 116* 160*   Lipid Profile: No results for input(s): CHOL, HDL, LDLCALC, TRIG, CHOLHDL, LDLDIRECT in the last 72 hours. Thyroid Function Tests: No results for input(s): TSH, T4TOTAL, FREET4, T3FREE, THYROIDAB in the last 72 hours. Anemia Panel: No results for input(s): VITAMINB12, FOLATE, FERRITIN, TIBC, IRON, RETICCTPCT in the last 72 hours. Sepsis Labs: Recent Labs  Lab 06/30/20 0431  PROCALCITON 1.04    Recent Results (from the past  240 hour(s))  Urine Culture     Status: Abnormal   Collection Time: 06/28/20  4:13 PM   Specimen: Urine, Random  Result Value Ref Range Status   Specimen Description URINE, RANDOM  Final   Special Requests NONE  Final   Culture >=100,000 COLONIES/mL YEAST (A)  Final   Report Status 07/01/2020 FINAL  Final  CULTURE, BLOOD (ROUTINE X 2) w Reflex to ID Panel     Status: None   Collection Time: 06/28/20  5:05 PM   Specimen: BLOOD  Result Value Ref Range Status   Specimen Description BLOOD RIGHT HAND  Final   Special Requests   Final    BOTTLES DRAWN AEROBIC AND ANAEROBIC Blood Culture results may not be optimal due to an excessive volume of blood received in culture bottles   Culture   Final    NO GROWTH 5 DAYS Performed at [facility], [address] [place], [place] [address]    Report Status 07/03/2020 FINAL  Final  CULTURE, BLOOD (ROUTINE X 2) w Reflex to ID Panel     Status: None   Collection Time: 06/28/20  5:05 PM    Specimen: BLOOD  Result Value Ref Range Status   Specimen Description BLOOD LEFT ANTECUBITAL  Final   Special Requests   Final    BOTTLES DRAWN AEROBIC AND ANAEROBIC Blood Culture adequate volume   Culture   Final    NO GROWTH 5 DAYS Performed at [facility], [address] [place], [place] [address]    Report Status 07/03/2020 FINAL  Final  MRSA Next Gen by PCR, Nasal     Status: None   Collection Time: 06/29/20  5:10 PM  Result Value Ref Range Status   MRSA by PCR Next Gen NOT DETECTED NOT DETECTED Final    Comment: (NOTE) The GeneXpert MRSA Assay (FDA approved for NASAL specimens only), is one component of a comprehensive MRSA colonization surveillance program. It is not intended to diagnose MRSA infection nor to guide or monitor treatment for MRSA infections. Test performance is not FDA approved in patients less than 51 years old. Performed at [facility], [address] [place], [place] [address]       Imaging Studies   No results found.   Medications   Scheduled Meds:  amLODipine  5 mg Per Tube Daily   chlorhexidine gluconate (MEDLINE KIT)  15 mL Mouth Rinse BID   Chlorhexidine Gluconate Cloth  6 each Topical Daily   cloNIDine  0.3 mg Transdermal Weekly   free water  150 mL Per Tube Q2H   gabapentin  100 mg Per Tube Q8H   heparin injection (subcutaneous)  5,000 Units Subcutaneous Q8H   insulin aspart  0-20 Units Subcutaneous Q4H   insulin aspart  3 Units Subcutaneous Q4H   insulin glargine  20 Units Subcutaneous Daily   metoprolol tartrate  25 mg Per Tube BID   multivitamin  1 tablet Per Tube QHS   nutrition supplement (JUVEN)  1 packet Per Tube BID   pantoprazole sodium  40 mg Per Tube Daily   prazosin  4 mg Per Tube QHS   QUEtiapine  12.5 mg Per Tube BID   sodium chloride flush  10-40 mL Intracatheter Q12H   traZODone  150 mg Per Tube QHS   Continuous Infusions:  sodium chloride Stopped (06/22/20 1614)   feeding supplement  (NEPRO CARB STEADY) 1,000 mL (07/05/20 0429)       LOS: 32 days  Time spent: 25 minutes with > 50% spent at bedside and in coordination of care.     Ezekiel Slocumb, DO [facility]  07/06/2020, 5:59 PM      If 7PM-7AM, please contact night-coverage. How to contact the [facility] Attending or Consulting provider [place] or covering provider during after hours [place], for this patient?    Check the care team in [facility] and look for a) attending/consulting [facility] provider listed and b) the [facility] team listed Log into [email or web address] and use [place]'s universal password to access. If you do not have the password, please contact the hospital operator. Locate the [facility] provider you are looking for under [facility] and page to a number that you can be directly reached. If you still have difficulty reaching the provider, please page the [facility] (Director on Call) for the Hospitalists listed on amion for assistance.

## 2020-07-07 DIAGNOSIS — E119 Type 2 diabetes mellitus without complications: Secondary | ICD-10-CM | POA: Diagnosis not present

## 2020-07-07 DIAGNOSIS — G9341 Metabolic encephalopathy: Secondary | ICD-10-CM | POA: Diagnosis not present

## 2020-07-07 LAB — GLUCOSE, CAPILLARY
Glucose-Capillary: 119 mg/dL — ABNORMAL HIGH (ref 70–99)
Glucose-Capillary: 139 mg/dL — ABNORMAL HIGH (ref 70–99)
Glucose-Capillary: 149 mg/dL — ABNORMAL HIGH (ref 70–99)
Glucose-Capillary: 151 mg/dL — ABNORMAL HIGH (ref 70–99)
Glucose-Capillary: 154 mg/dL — ABNORMAL HIGH (ref 70–99)
Glucose-Capillary: 163 mg/dL — ABNORMAL HIGH (ref 70–99)

## 2020-07-07 LAB — BASIC METABOLIC PANEL
Anion gap: 9 (ref 5–15)
BUN: 134 mg/dL — ABNORMAL HIGH (ref 8–23)
CO2: 26 mmol/L (ref 22–32)
Calcium: 10.2 mg/dL (ref 8.9–10.3)
Chloride: 106 mmol/L (ref 98–111)
Creatinine, Ser: 3.3 mg/dL — ABNORMAL HIGH (ref 0.61–1.24)
GFR, Estimated: 20 mL/min — ABNORMAL LOW (ref >60)
Glucose, Bld: 168 mg/dL — ABNORMAL HIGH (ref 70–99)
Potassium: 3.8 mmol/L (ref 3.5–5.1)
Sodium: 141 mmol/L (ref 135–145)

## 2020-07-07 LAB — MAGNESIUM: Magnesium: 2.3 mg/dL (ref 1.7–2.4)

## 2020-07-07 MED ORDER — FREE WATER
150.0000 mL | Status: DC
  Administered 2020-07-07 – 2020-07-17 (×59): 150 mL

## 2020-07-07 NOTE — Plan of Care (Signed)
End of Shift Summary:   Alert to self. More conversational tonight, initiating conversation and answering questions. Pain and nausea managed with prn medications.  VSS on room air. Continuous tube feeds maintained with q2h flush. Heels floated in prevalon boots. Sacral foam dressing to pressure injury changed. PEG tube dressing changed. Q2T. Oral care done q4h. (+) BM. Urine output adequate via foley catheter. Pt remained free from falls or injury. Bed low and in locked position. Intentional rounding to anticipate needs in lieu of call bell.    Problem: Clinical Measurements: Goal: Ability to maintain clinical measurements within normal limits will improve Outcome: Progressing Goal: Will remain free from infection Outcome: Progressing Goal: Diagnostic test results will improve Outcome: Progressing Goal: Respiratory complications will improve Outcome: Progressing Goal: Cardiovascular complication will be avoided Outcome: Progressing   Problem: Activity: Goal: Risk for activity intolerance will decrease Outcome: Progressing   Problem: Nutrition: Goal: Adequate nutrition will be maintained Outcome: Progressing   Problem: Elimination: Goal: Will not experience complications related to bowel motility Outcome: Progressing Goal: Will not experience complications related to urinary retention Outcome: Progressing   Problem: Pain Managment: Goal: General experience of comfort will improve Outcome: Progressing   Problem: Skin Integrity: Goal: Risk for impaired skin integrity will decrease Outcome: Progressing   Problem: Safety: Goal: Ability to remain free from injury will improve Outcome: Progressing

## 2020-07-07 NOTE — Progress Notes (Addendum)
PROGRESS NOTE    Wyatt Ball.   UMP:[id number]  DOB: [birth year]  PCP: System, Provider Not In    DOA: 06/04/2020 LOS: 33   Assessment & Plan   Principal Problem:   CAP (community acquired pneumonia) Active Problems:   Acute metabolic encephalopathy   Severe sepsis with septic shock ([place])   Neurogenic bladder   Chronic, continuous use of opioids   Chronic low back pain   Type 2 diabetes mellitus without complication (HCC)   Migraines   Recurrent UTI   History of colon polyps   Urinary retention   Altered mental status   Pressure injury of skin   AKI (acute kidney injury) ([place])   Septic shock secondary to community-acquired pneumonia: Status post 8 days azithromycin, 3 days of cephalosporin plus 5 days of IV meropenem followed by 7 days of Levaquin and doxycycline.   S/p bronchoscopy blood cultures were negative.   No growth on blood cultures . Monitor closely.   New fever and tachycardia on 06/28/2020, new sepsis secondary to bilateral pneumonia: MRSA screen was negative.  No growth on repeat blood cultures.  He was treated with IV vancomycin from 06/28/2020 through 06/30/2020 and IV meropenem from 06/28/2020 through 07/01/2020.  Urine culture from 06/28/2020 showed yeast.  Of note, patient had previously been treated with IV fluconazole from 06/25/2020 through 06/30/2020.   Acute hypoxic respiratory failure: Due to PNA. Resolved.  S/p intubation and extubation.     AKI, hyperphosphatemia: stable, Cr is slowly improving.   Good UO.  Hemodialysis was initiated on 06/10/2020, now off and HD catheter removed. No plan for further hemodialysis.  He is not a good candidate for long-term dialysis per nephrologist.     Hypernatremia: Resolved with adjusting of free water flushes after G tube placed.     7/3 - Na 141 - reduce to 150 cc q4h (from q2h) Monitor BMP.   Acute metabolic encephalopathy, agitation, history of PTSD: Patient has been evaluated by neurologist.  EEG was  unremarkable.   --Continue psychotropics and prazosin   Inadequate PO intake due to Dysphagia - inability to eat due to acute prolonged illness as above. --G tube placed 07/03/20 --Tube feeds continuous, per RD orders --NPO status  Hypertension: Continue antihypertensives   Stage II coccygeal decubitus ulcer: Present on admission: Continue foam dressing.  Frequent repositioning. Monitor closely. Pressure Injury 06/23/20 Coccyx Posterior;Mid Stage 2 -  Partial thickness loss of dermis presenting as a shallow open injury with a red, pink wound bed without slough. (Active)  06/23/20 1400  Location: Coccyx  Location Orientation: Posterior;Mid  Staging: Stage 2 -  Partial thickness loss of dermis presenting as a shallow open injury with a red, pink wound bed without slough.  Wound Description (Comments):   Present on Admission: Yes     Hyperkalemia: Resolved   Neurogenic bladder due to hx of transverse myelitis  Chronic pain syndrome on opioids   Obesity: Body mass index is 30.37 kg/m.  Complicates overall care and prognosis.  Recommend lifestyle modifications including physical activity and diet for weight loss and overall long-term health.   DVT prophylaxis: heparin injection 5,000 Units Start: 06/10/20 2200   Diet:     Code Status: DNR   Brief Narrative / Hospital Course to Date:   Medical records reviewed and are as summarized below:  Wyatt Ball. is a 60 y.o. male with medical history significant for hypertension, PTSD, diabetes, migraine, GERD, neurogenic bladder secondary to remote history of transverse myelitis requiring  self urethral catheterization, frequent UTIs, chronic back pain on opioids, generally independent at baseline.  He usually gets his care at the [facility] hospital.  He was brought to the emergency room because of altered mental status, cough, shortness of breath, fever, vomiting and diarrhea.   He was admitted to the hospital for sepsis secondary to  pneumonia.  He was transferred to the ICU for septic shock, AKI, COPD exacerbation.  He was intubated and placed on mechanical ventilation.  He was treated with empiric IV antibiotics including IV fluconazole for yeast in sputum, IV fluids.  NG tube was placed for enteral nutrition and medications because of severe dysphagia.  Hemodialysis was discontinued because he is not a candidate for long-term hemodialysis per nephrologist.   5/21 admitted for sepsis and pneumonia. 6/2 transferred to ICU, intubated underwent bronchoscopy 6/3 self extubated followed by reintubated 6/7 HD started 6/15 extubated 6/19 transferred to [facility]. Neurology, nephrology, urology and infectious disease consulted.     After transfer to progressive care unit, he developed fever and tachycardia that was concerning for sepsis on 06/28/2020.  He was restarted on IV antibiotics (meropenem and vancomycin).  Repeat blood culture did not show any growth.  Gastroenterologist was consulted for PEG tube placement per family's request.    Subjective 07/07/20    Pt awake sitting up.  Is verbal today, very soft voice due to poor/weak projection.  Says he is feeling a little better.  Disposition Plan & Communication   Status is: Inpatient  Remains inpatient appropriate because:Inpatient level of care appropriate due to severity of illness with encephalopathy which is very slowly improving.  Dispo: The patient is from: Home              Anticipated d/c is to: SNF vs LTAC              Patient currently is not medically stable to d/c.   Difficult to place patient No   Consults, Procedures, Significant Events   Consultants:  Gastroenterology Palliative care Nephrology  Procedures:  As above  Antimicrobials:  Anti-infectives (From admission, onward)    Start     Dose/Rate Route Frequency Ordered Stop   06/28/20 1815  vancomycin (VANCOREADY) IVPB 1750 mg/350 mL        1,750 mg 175 mL/hr over 120 Minutes Intravenous   Once 06/28/20 1726 06/28/20 2054   06/28/20 1815  meropenem (MERREM) 1 g in sodium chloride 0.9 % 100 mL IVPB  Status:  Discontinued        1 g 200 mL/hr over 30 Minutes Intravenous Every 12 hours 06/28/20 1726 07/01/20 1558   06/28/20 1742  vancomycin variable dose per unstable renal function (pharmacist dosing)  Status:  Discontinued         Does not apply See admin instructions 06/28/20 1742 06/30/20 0757   06/26/20 2100  fluconazole (DIFLUCAN) IVPB 50 mg       See Hyperspace for full Linked Orders Report.   50 mg 25 mL/hr over 60 Minutes Intravenous Every 24 hours 06/25/20 1835 06/30/20 2118   06/25/20 2000  fluconazole (DIFLUCAN) IVPB 200 mg       See Hyperspace for full Linked Orders Report.   200 mg 100 mL/hr over 60 Minutes Intravenous  Once 06/25/20 1835 06/25/20 2348   06/14/20 1800  levofloxacin (LEVAQUIN) IVPB 500 mg        500 mg 100 mL/hr over 60 Minutes Intravenous Every 48 hours 06/12/20 1624 06/19/20 0210   06/12/20 1800  levofloxacin (LEVAQUIN) IVPB 750 mg        750 mg 100 mL/hr over 90 Minutes Intravenous  Once 06/11/20 2239 06/12/20 1904   06/11/20 1800  meropenem (MERREM) 500 mg in sodium chloride 0.9 % 100 mL IVPB  Status:  Discontinued        500 mg 200 mL/hr over 30 Minutes Intravenous Every 24 hours 06/10/20 1444 06/11/20 2231   06/10/20 1300  doxycycline (VIBRAMYCIN) 100 mg in sodium chloride 0.9 % 250 mL IVPB  Status:  Discontinued        100 mg 125 mL/hr over 120 Minutes Intravenous Every 12 hours 06/10/20 1149 06/17/20 1704   06/09/20 1015  meropenem (MERREM) 500 mg in sodium chloride 0.9 % 100 mL IVPB        500 mg 200 mL/hr over 30 Minutes Intravenous Every 12 hours 06/09/20 0922 06/11/20 1203   06/08/20 2200  meropenem (MERREM) 1 g in sodium chloride 0.9 % 100 mL IVPB  Status:  Discontinued        1 g 200 mL/hr over 30 Minutes Intravenous Every 12 hours 06/08/20 0845 06/09/20 0922   06/07/20 2200  azithromycin (ZITHROMAX) 500 mg in sodium chloride 0.9  % 250 mL IVPB        500 mg 250 mL/hr over 60 Minutes Intravenous Every 24 hours 06/07/20 1441 06/11/20 2211   06/07/20 1600  ceFEPIme (MAXIPIME) 2 g in sodium chloride 0.9 % 100 mL IVPB  Status:  Discontinued        2 g 200 mL/hr over 30 Minutes Intravenous Every 8 hours 06/07/20 1441 06/07/20 1442   06/07/20 1600  meropenem (MERREM) 1 g in sodium chloride 0.9 % 100 mL IVPB  Status:  Discontinued        1 g 200 mL/hr over 30 Minutes Intravenous Every 8 hours 06/07/20 1442 06/08/20 0845   06/06/20 1630  vancomycin (VANCOREADY) IVPB 1500 mg/300 mL  Status:  Discontinued        1,500 mg 150 mL/hr over 120 Minutes Intravenous  Once 06/06/20 1533 06/06/20 1632   06/05/20 0600  cefTRIAXone (ROCEPHIN) 2 g in sodium chloride 0.9 % 100 mL IVPB  Status:  Discontinued        2 g 200 mL/hr over 30 Minutes Intravenous Every 24 hours 06/04/20 2029 06/07/20 1439   06/04/20 2200  azithromycin (ZITHROMAX) 500 mg in sodium chloride 0.9 % 250 mL IVPB        500 mg 250 mL/hr over 60 Minutes Intravenous Every 24 hours 06/04/20 2029 06/07/20 0003   06/04/20 1915  ceFEPIme (MAXIPIME) 2 g in sodium chloride 0.9 % 100 mL IVPB        2 g 200 mL/hr over 30 Minutes Intravenous  Once 06/04/20 1906 06/04/20 2055   06/04/20 1915  metroNIDAZOLE (FLAGYL) IVPB 500 mg        500 mg 100 mL/hr over 60 Minutes Intravenous  Once 06/04/20 1906 06/04/20 2055   06/04/20 1915  vancomycin (VANCOCIN) IVPB 1000 mg/200 mL premix  Status:  Discontinued        1,000 mg 200 mL/hr over 60 Minutes Intravenous  Once 06/04/20 1906 06/04/20 1912   06/04/20 1915  vancomycin (VANCOREADY) IVPB 1750 mg/350 mL        1,750 mg 175 mL/hr over 120 Minutes Intravenous  Once 06/04/20 1912 06/04/20 2257         Micro    Objective   Vitals:   07/07/20 0416 07/07/20 0825 07/07/20 1200  07/07/20 1542  BP:  (!) 157/62 132/72 140/66  Pulse:  79 80 87  Resp:  18 17 18   Temp:  98.7 F (37.1 C) 98.5 F (36.9 C) 97.8 F (36.6 C)  TempSrc:       SpO2:  98% 100% 99%  Weight: 85.3 kg     Height:        Intake/Output Summary (Last 24 hours) at 07/07/2020 1649 Last data filed at 07/07/2020 0540 Gross per 24 hour  Intake 2617.5 ml  Output 1900 ml  Net 717.5 ml   Filed Weights   07/02/20 0310 07/06/20 0500 07/07/20 0416  Weight: 86.2 kg 83.6 kg 85.3 kg    Physical Exam:  General exam: awake and alert, sitting upright in bed, no acute distress, speaking today but very poor vocal projection Respiratory system: CTAB, no wheezes or rhonchi, on room air. Cardiovascular system: normal S1/S2, RRR, no peripheral edema   Gastrointestinal system: soft, NT, ND G-tube present with continuous tube feeds running, Bowel sounds present. Extremities: no LE edema, offloading boots on feet   Labs   Data Reviewed: I have personally reviewed following labs and imaging studies  CBC: Recent Labs  Lab 07/01/20 0742  WBC 5.0  NEUTROABS 3.3  HGB 9.2*  HCT 28.5*  MCV 90.2  PLT 005   Basic Metabolic Panel: Recent Labs  Lab 07/01/20 0742 07/01/20 0747 07/02/20 0428 07/03/20 0530 07/04/20 0500 07/05/20 0410 07/06/20 0530 07/07/20 0914  NA  --  150*   < > 151* 151* 151* 145 141  K  --  4.3   < > 3.8 3.7 3.9 3.5 3.8  CL  --  113*   < > 115* 118* 116* 110 106  CO2  --  23   < > 26 25 26 25 26   GLUCOSE  --  152*   < > 134* 137* 147* 148* 168*  BUN  --  157*   < > 137* 136* 130* 124* 134*  CREATININE  --  5.47*   < > 4.88* 4.52* 4.10* 3.79* 3.30*  CALCIUM  --  10.8*   < > 10.0 9.8 10.0 9.6 10.2  MG 3.0*  --   --   --   --  2.5*  --  2.3  PHOS  --  7.1*  --   --   --  6.3*  --   --    < > = values in this interval not displayed.   GFR: Estimated Creatinine Clearance: 24.1 mL/min (A) (by C-G formula based on SCr of 3.3 mg/dL (H)). Liver Function Tests: Recent Labs  Lab 07/01/20 0747  ALBUMIN 2.8*   No results for input(s): LIPASE, AMYLASE in the last 168 hours. No results for input(s): AMMONIA in the last 168 hours. Coagulation  Profile: No results for input(s): INR, PROTIME in the last 168 hours. Cardiac Enzymes: No results for input(s): CKTOTAL, CKMB, CKMBINDEX, TROPONINI in the last 168 hours. BNP (last 3 results) No results for input(s): PROBNP in the last 8760 hours. HbA1C: No results for input(s): HGBA1C in the last 72 hours. CBG: Recent Labs  Lab 07/07/20 0010 07/07/20 0402 07/07/20 0826 07/07/20 1158 07/07/20 1544  GLUCAP 154* 119* 149* 163* 151*   Lipid Profile: No results for input(s): CHOL, HDL, LDLCALC, TRIG, CHOLHDL, LDLDIRECT in the last 72 hours. Thyroid Function Tests: No results for input(s): TSH, T4TOTAL, FREET4, T3FREE, THYROIDAB in the last 72 hours. Anemia Panel: No results for input(s): VITAMINB12, FOLATE, FERRITIN, TIBC, IRON,  RETICCTPCT in the last 72 hours. Sepsis Labs: No results for input(s): PROCALCITON, LATICACIDVEN in the last 168 hours.   Recent Results (from the past 240 hour(s))  Urine Culture     Status: Abnormal   Collection Time: 06/28/20  4:13 PM   Specimen: Urine, Random  Result Value Ref Range Status   Specimen Description URINE, RANDOM  Final   Special Requests NONE  Final   Culture >=100,000 COLONIES/mL YEAST (A)  Final   Report Status 07/01/2020 FINAL  Final  CULTURE, BLOOD (ROUTINE X 2) w Reflex to ID Panel     Status: None   Collection Time: 06/28/20  5:05 PM   Specimen: BLOOD  Result Value Ref Range Status   Specimen Description BLOOD RIGHT HAND  Final   Special Requests   Final    BOTTLES DRAWN AEROBIC AND ANAEROBIC Blood Culture results may not be optimal due to an excessive volume of blood received in culture bottles   Culture   Final    NO GROWTH 5 DAYS Performed at [facility], [address] [place], [place] [address]    Report Status 07/03/2020 FINAL  Final  CULTURE, BLOOD (ROUTINE X 2) w Reflex to ID Panel     Status: None   Collection Time: 06/28/20  5:05 PM   Specimen: BLOOD  Result Value Ref Range Status   Specimen  Description BLOOD LEFT ANTECUBITAL  Final   Special Requests   Final    BOTTLES DRAWN AEROBIC AND ANAEROBIC Blood Culture adequate volume   Culture   Final    NO GROWTH 5 DAYS Performed at [facility], [address] [place], [place] [address]    Report Status 07/03/2020 FINAL  Final  MRSA Next Gen by PCR, Nasal     Status: None   Collection Time: 06/29/20  5:10 PM  Result Value Ref Range Status   MRSA by PCR Next Gen NOT DETECTED NOT DETECTED Final    Comment: (NOTE) The GeneXpert MRSA Assay (FDA approved for NASAL specimens only), is one component of a comprehensive MRSA colonization surveillance program. It is not intended to diagnose MRSA infection nor to guide or monitor treatment for MRSA infections. Test performance is not FDA approved in patients less than 60 years old. Performed at [facility], [address] [place], [place] [address]       Imaging Studies   No results found.   Medications   Scheduled Meds:  amLODipine  5 mg Per Tube Daily   chlorhexidine gluconate (MEDLINE KIT)  15 mL Mouth Rinse BID   Chlorhexidine Gluconate Cloth  6 each Topical Daily   cloNIDine  0.3 mg Transdermal Weekly   free water  150 mL Per Tube Q2H   gabapentin  100 mg Per Tube Q8H   heparin injection (subcutaneous)  5,000 Units Subcutaneous Q8H   insulin aspart  0-20 Units Subcutaneous Q4H   insulin aspart  3 Units Subcutaneous Q4H   insulin glargine  20 Units Subcutaneous Daily   metoprolol tartrate  25 mg Per Tube BID   multivitamin  1 tablet Per Tube QHS   nutrition supplement (JUVEN)  1 packet Per Tube BID   pantoprazole sodium  40 mg Per Tube Daily   prazosin  4 mg Per Tube QHS   QUEtiapine  12.5 mg Per Tube BID   sodium chloride flush  10-40 mL Intracatheter Q12H   traZODone  150 mg Per Tube QHS   Continuous Infusions:  sodium  chloride Stopped (06/22/20 1614)   feeding supplement (NEPRO CARB STEADY) 1,000 mL (07/05/20 0429)       LOS: 33  days    Time spent: 25 minutes with > 50% spent at bedside and in coordination of care.     Ezekiel Slocumb, DO [facility]  07/07/2020, 4:49 PM      If 7PM-7AM, please contact night-coverage. How to contact the [facility] Attending or Consulting provider [place] or covering provider during after hours [place], for this patient?    Check the care team in [facility] and look for a) attending/consulting [facility] provider listed and b) the [facility] team listed Log into [email or web address] and use [place]'s universal password to access. If you do not have the password, please contact the hospital operator. Locate the [facility] provider you are looking for under [facility] and page to a number that you can be directly reached. If you still have difficulty reaching the provider, please page the [facility] (Director on Call) for the Hospitalists listed on amion for assistance.

## 2020-07-08 DIAGNOSIS — E119 Type 2 diabetes mellitus without complications: Secondary | ICD-10-CM | POA: Diagnosis not present

## 2020-07-08 DIAGNOSIS — G9341 Metabolic encephalopathy: Secondary | ICD-10-CM | POA: Diagnosis not present

## 2020-07-08 LAB — CBC
HCT: 25.6 % — ABNORMAL LOW (ref 39.0–52.0)
Hemoglobin: 8.7 g/dL — ABNORMAL LOW (ref 13.0–17.0)
MCH: 30 pg (ref 26.0–34.0)
MCHC: 34 g/dL (ref 30.0–36.0)
MCV: 88.3 fL (ref 80.0–100.0)
Platelets: 241 10*3/uL (ref 150–400)
RBC: 2.9 MIL/uL — ABNORMAL LOW (ref 4.22–5.81)
RDW: 13.4 % (ref 11.5–15.5)
WBC: 7.9 10*3/uL (ref 4.0–10.5)
nRBC: 0 % (ref 0.0–0.2)

## 2020-07-08 LAB — PHOSPHORUS: Phosphorus: 4.2 mg/dL (ref 2.5–4.6)

## 2020-07-08 LAB — GLUCOSE, CAPILLARY
Glucose-Capillary: 112 mg/dL — ABNORMAL HIGH (ref 70–99)
Glucose-Capillary: 115 mg/dL — ABNORMAL HIGH (ref 70–99)
Glucose-Capillary: 127 mg/dL — ABNORMAL HIGH (ref 70–99)
Glucose-Capillary: 127 mg/dL — ABNORMAL HIGH (ref 70–99)
Glucose-Capillary: 145 mg/dL — ABNORMAL HIGH (ref 70–99)
Glucose-Capillary: 150 mg/dL — ABNORMAL HIGH (ref 70–99)
Glucose-Capillary: 156 mg/dL — ABNORMAL HIGH (ref 70–99)

## 2020-07-08 LAB — BASIC METABOLIC PANEL
Anion gap: 9 (ref 5–15)
BUN: 119 mg/dL — ABNORMAL HIGH (ref 8–23)
CO2: 27 mmol/L (ref 22–32)
Calcium: 10 mg/dL (ref 8.9–10.3)
Chloride: 104 mmol/L (ref 98–111)
Creatinine, Ser: 3.3 mg/dL — ABNORMAL HIGH (ref 0.61–1.24)
GFR, Estimated: 20 mL/min — ABNORMAL LOW (ref >60)
Glucose, Bld: 162 mg/dL — ABNORMAL HIGH (ref 70–99)
Potassium: 3.3 mmol/L — ABNORMAL LOW (ref 3.5–5.1)
Sodium: 140 mmol/L (ref 135–145)

## 2020-07-08 MED ORDER — POTASSIUM CHLORIDE 20 MEQ PO PACK
40.0000 meq | PACK | Freq: Once | ORAL | Status: AC
  Administered 2020-07-08: 09:00:00 40 meq
  Filled 2020-07-08: qty 2

## 2020-07-08 NOTE — Progress Notes (Signed)
PROGRESS NOTE    Wyatt Ball.   KWI:[id number]  DOB: 02-14-1960  PCP: System, Provider Not In    DOA: 06/04/2020 LOS: 34   Assessment & Plan   Principal Problem:   CAP (community acquired pneumonia) Active Problems:   Acute metabolic encephalopathy   Severe sepsis with septic shock ([place])   Neurogenic bladder   Chronic, continuous use of opioids   Chronic low back pain   Type 2 diabetes mellitus without complication (HCC)   Migraines   Recurrent UTI   History of colon polyps   Urinary retention   Altered mental status   Pressure injury of skin   AKI (acute kidney injury) ([place])   Septic shock secondary to community-acquired pneumonia: Status post 8 days azithromycin, 3 days of cephalosporin plus 5 days of IV meropenem followed by 7 days of Levaquin and doxycycline.   S/p bronchoscopy blood cultures were negative.   No growth on blood cultures . Monitor closely.   New fever and tachycardia on 06/28/2020, new sepsis secondary to bilateral pneumonia: MRSA screen was negative.  No growth on repeat blood cultures.  He was treated with IV vancomycin from 06/28/2020 through 06/30/2020 and IV meropenem from 06/28/2020 through 07/01/2020.  Urine culture from 06/28/2020 showed yeast.  Of note, patient had previously been treated with IV fluconazole from 06/25/2020 through 06/30/2020.   Acute hypoxic respiratory failure: Due to PNA. Resolved.  S/p intubation and extubation.     AKI, hyperphosphatemia: stable, Cr is slowly improving.   Good UO.  Hemodialysis was initiated on 06/10/2020, now off and HD catheter removed. No plan for further hemodialysis.  He is not a good candidate for long-term dialysis per nephrologist.     Hypernatremia: Resolved with adjusting of free water flushes after G tube placed.     Na stable on 150 cc q4h - continue for now. Monitor BMP.   Hypokalemia - K 3.3 on 7/4, replaced per G tube.  Monitor and replace PRN.  Acute metabolic encephalopathy,  agitation, history of PTSD: Patient has been evaluated by neurologist.  EEG was unremarkable.   --Continue psychotropics and prazosin   Inadequate PO intake due to Dysphagia - inability to eat due to acute prolonged illness as above. --G tube placed 07/03/20 --Tube feeds continuous, per RD orders --NPO status  Hypertension: Continue antihypertensives   Stage II coccygeal decubitus ulcer: Present on admission: Continue foam dressing.  Frequent repositioning. Monitor closely. Pressure Injury 06/23/20 Coccyx Posterior;Mid Stage 2 -  Partial thickness loss of dermis presenting as a shallow open injury with a red, pink wound bed without slough. (Active)  06/23/20 1400  Location: Coccyx  Location Orientation: Posterior;Mid  Staging: Stage 2 -  Partial thickness loss of dermis presenting as a shallow open injury with a red, pink wound bed without slough.  Wound Description (Comments):   Present on Admission: Yes     Hyperkalemia: Resolved   Neurogenic bladder due to hx of transverse myelitis  Chronic pain syndrome on opioids   Obesity: Body mass index is 29.26 kg/m.  Complicates overall care and prognosis.  Recommend lifestyle modifications including physical activity and diet for weight loss and overall long-term health.   DVT prophylaxis: heparin injection 5,000 Units Start: 06/10/20 2200   Diet:     Code Status: DNR   Brief Narrative / Hospital Course to Date:   Medical records reviewed and are as summarized below:  Wyatt Johnathan Vrba Brooke Bonito. is a 60 y.o. male with medical history significant for hypertension,  PTSD, diabetes, migraine, GERD, neurogenic bladder secondary to remote history of transverse myelitis requiring self urethral catheterization, frequent UTIs, chronic back pain on opioids, generally independent at baseline.  He usually gets his care at the [facility] hospital.  He was brought to the emergency room because of altered mental status, cough, shortness of breath, fever,  vomiting and diarrhea.   He was admitted to the hospital for sepsis secondary to pneumonia.  He was transferred to the ICU for septic shock, AKI, COPD exacerbation.  He was intubated and placed on mechanical ventilation.  He was treated with empiric IV antibiotics including IV fluconazole for yeast in sputum, IV fluids.  NG tube was placed for enteral nutrition and medications because of severe dysphagia.  Hemodialysis was discontinued because he is not a candidate for long-term hemodialysis per nephrologist.   5/21 admitted for sepsis and pneumonia. 6/2 transferred to ICU, intubated underwent bronchoscopy 6/3 self extubated followed by reintubated 6/7 HD started 6/15 extubated 6/19 transferred to [facility] Neurology, nephrology, urology and infectious disease consulted.     After transfer to progressive care unit, he developed fever and tachycardia that was concerning for sepsis on 06/28/2020.  He was restarted on IV antibiotics (meropenem and vancomycin).  Repeat blood culture did not show any growth.  Gastroenterologist was consulted for PEG tube placement per family's request.    Subjective 07/08/20    Pt awake resting in bed, waved at me in hall before I entered the room.  Reports slowly feeling better.  Asked in feeling sick with fever/chills, nausea/vomiting or other problems and he replies "not really".  No acute event reported.  Disposition Plan & Communication   Status is: Inpatient  Remains inpatient appropriate because:Inpatient level of care appropriate due to severity of illness with encephalopathy which is very slowly improving.  Dispo: The patient is from: Home              Anticipated d/c is to: SNF vs LTAC              Patient currently is not medically stable to d/c.   Difficult to place patient No   Consults, Procedures, Significant Events   Consultants:  Gastroenterology Palliative care Nephrology  Procedures:  As above  Antimicrobials:  Anti-infectives (From  admission, onward)    Start     Dose/Rate Route Frequency Ordered Stop   06/28/20 1815  vancomycin (VANCOREADY) IVPB 1750 mg/350 mL        1,750 mg 175 mL/hr over 120 Minutes Intravenous  Once 06/28/20 1726 06/28/20 2054   06/28/20 1815  meropenem (MERREM) 1 g in sodium chloride 0.9 % 100 mL IVPB  Status:  Discontinued        1 g 200 mL/hr over 30 Minutes Intravenous Every 12 hours 06/28/20 1726 07/01/20 1558   06/28/20 1742  vancomycin variable dose per unstable renal function (pharmacist dosing)  Status:  Discontinued         Does not apply See admin instructions 06/28/20 1742 06/30/20 0757   06/26/20 2100  fluconazole (DIFLUCAN) IVPB 50 mg       See Hyperspace for full Linked Orders Report.   50 mg 25 mL/hr over 60 Minutes Intravenous Every 24 hours 06/25/20 1835 06/30/20 2118   06/25/20 2000  fluconazole (DIFLUCAN) IVPB 200 mg       See Hyperspace for full Linked Orders Report.   200 mg 100 mL/hr over 60 Minutes Intravenous  Once 06/25/20 1835 06/25/20 2348   06/14/20 1800  levofloxacin (LEVAQUIN) IVPB 500 mg        500 mg 100 mL/hr over 60 Minutes Intravenous Every 48 hours 06/12/20 1624 06/19/20 0210   06/12/20 1800  levofloxacin (LEVAQUIN) IVPB 750 mg        750 mg 100 mL/hr over 90 Minutes Intravenous  Once 06/11/20 2239 06/12/20 1904   06/11/20 1800  meropenem (MERREM) 500 mg in sodium chloride 0.9 % 100 mL IVPB  Status:  Discontinued        500 mg 200 mL/hr over 30 Minutes Intravenous Every 24 hours 06/10/20 1444 06/11/20 2231   06/10/20 1300  doxycycline (VIBRAMYCIN) 100 mg in sodium chloride 0.9 % 250 mL IVPB  Status:  Discontinued        100 mg 125 mL/hr over 120 Minutes Intravenous Every 12 hours 06/10/20 1149 06/17/20 1704   06/09/20 1015  meropenem (MERREM) 500 mg in sodium chloride 0.9 % 100 mL IVPB        500 mg 200 mL/hr over 30 Minutes Intravenous Every 12 hours 06/09/20 0922 06/11/20 1203   06/08/20 2200  meropenem (MERREM) 1 g in sodium chloride 0.9 % 100 mL  IVPB  Status:  Discontinued        1 g 200 mL/hr over 30 Minutes Intravenous Every 12 hours 06/08/20 0845 06/09/20 0922   06/07/20 2200  azithromycin (ZITHROMAX) 500 mg in sodium chloride 0.9 % 250 mL IVPB        500 mg 250 mL/hr over 60 Minutes Intravenous Every 24 hours 06/07/20 1441 06/11/20 2211   06/07/20 1600  ceFEPIme (MAXIPIME) 2 g in sodium chloride 0.9 % 100 mL IVPB  Status:  Discontinued        2 g 200 mL/hr over 30 Minutes Intravenous Every 8 hours 06/07/20 1441 06/07/20 1442   06/07/20 1600  meropenem (MERREM) 1 g in sodium chloride 0.9 % 100 mL IVPB  Status:  Discontinued        1 g 200 mL/hr over 30 Minutes Intravenous Every 8 hours 06/07/20 1442 06/08/20 0845   06/06/20 1630  vancomycin (VANCOREADY) IVPB 1500 mg/300 mL  Status:  Discontinued        1,500 mg 150 mL/hr over 120 Minutes Intravenous  Once 06/06/20 1533 06/06/20 1632   06/05/20 0600  cefTRIAXone (ROCEPHIN) 2 g in sodium chloride 0.9 % 100 mL IVPB  Status:  Discontinued        2 g 200 mL/hr over 30 Minutes Intravenous Every 24 hours 06/04/20 2029 06/07/20 1439   06/04/20 2200  azithromycin (ZITHROMAX) 500 mg in sodium chloride 0.9 % 250 mL IVPB        500 mg 250 mL/hr over 60 Minutes Intravenous Every 24 hours 06/04/20 2029 06/07/20 0003   06/04/20 1915  ceFEPIme (MAXIPIME) 2 g in sodium chloride 0.9 % 100 mL IVPB        2 g 200 mL/hr over 30 Minutes Intravenous  Once 06/04/20 1906 06/04/20 2055   06/04/20 1915  metroNIDAZOLE (FLAGYL) IVPB 500 mg        500 mg 100 mL/hr over 60 Minutes Intravenous  Once 06/04/20 1906 06/04/20 2055   06/04/20 1915  vancomycin (VANCOCIN) IVPB 1000 mg/200 mL premix  Status:  Discontinued        1,000 mg 200 mL/hr over 60 Minutes Intravenous  Once 06/04/20 1906 06/04/20 1912   06/04/20 1915  vancomycin (VANCOREADY) IVPB 1750 mg/350 mL        1,750 mg 175 mL/hr over 120  Minutes Intravenous  Once 06/04/20 1912 06/04/20 2257         Micro    Objective   Vitals:    07/08/20 0017 07/08/20 0413 07/08/20 0500 07/08/20 0849  BP: 128/63 (!) 149/66  139/67  Pulse: 89 97  88  Resp: 16 14  14   Temp: (!) 97.1 F (36.2 C) 97.8 F (36.6 C)  98.3 F (36.8 C)  TempSrc:      SpO2: 99% 98%  98%  Weight:   82.2 kg   Height:        Intake/Output Summary (Last 24 hours) at 07/08/2020 1326 Last data filed at 07/08/2020 0500 Gross per 24 hour  Intake 1166.67 ml  Output 1500 ml  Net -333.33 ml   Filed Weights   07/06/20 0500 07/07/20 0416 07/08/20 0500  Weight: 83.6 kg 85.3 kg 82.2 kg    Physical Exam:  General exam: awake and alert, no acute distress Respiratory system: CTAB, no wheezes or rhonchi, on room air. Cardiovascular system: normal S1/S2, RRR, no peripheral edema   Gastrointestinal system: soft, NT, ND G-tube present with continuous tube feeds running, Bowel sounds present. Neuro: no gross focal deficits, normal speech but with weak vocal projection Extremities: no LE edema, offloading boots on feet   Labs   Data Reviewed: I have personally reviewed following labs and imaging studies  CBC: Recent Labs  Lab 07/08/20 0609  WBC 7.9  HGB 8.7*  HCT 25.6*  MCV 88.3  PLT 093   Basic Metabolic Panel: Recent Labs  Lab 07/04/20 0500 07/05/20 0410 07/06/20 0530 07/07/20 0914 07/08/20 0609  NA 151* 151* 145 141 140  K 3.7 3.9 3.5 3.8 3.3*  CL 118* 116* 110 106 104  CO2 25 26 25 26 27   GLUCOSE 137* 147* 148* 168* 162*  BUN 136* 130* 124* 134* 119*  CREATININE 4.52* 4.10* 3.79* 3.30* 3.30*  CALCIUM 9.8 10.0 9.6 10.2 10.0  MG  --  2.5*  --  2.3  --   PHOS  --  6.3*  --   --  4.2   GFR: Estimated Creatinine Clearance: 23.7 mL/min (A) (by C-G formula based on SCr of 3.3 mg/dL (H)). Liver Function Tests: No results for input(s): AST, ALT, ALKPHOS, BILITOT, PROT, ALBUMIN in the last 168 hours.  No results for input(s): LIPASE, AMYLASE in the last 168 hours. No results for input(s): AMMONIA in the last 168 hours. Coagulation Profile: No  results for input(s): INR, PROTIME in the last 168 hours. Cardiac Enzymes: No results for input(s): CKTOTAL, CKMB, CKMBINDEX, TROPONINI in the last 168 hours. BNP (last 3 results) No results for input(s): PROBNP in the last 8760 hours. HbA1C: No results for input(s): HGBA1C in the last 72 hours. CBG: Recent Labs  Lab 07/08/20 0015 07/08/20 0413 07/08/20 0619 07/08/20 0850 07/08/20 1208  GLUCAP 145* 115* 150* 156* 127*   Lipid Profile: No results for input(s): CHOL, HDL, LDLCALC, TRIG, CHOLHDL, LDLDIRECT in the last 72 hours. Thyroid Function Tests: No results for input(s): TSH, T4TOTAL, FREET4, T3FREE, THYROIDAB in the last 72 hours. Anemia Panel: No results for input(s): VITAMINB12, FOLATE, FERRITIN, TIBC, IRON, RETICCTPCT in the last 72 hours. Sepsis Labs: No results for input(s): PROCALCITON, LATICACIDVEN in the last 168 hours.   Recent Results (from the past 240 hour(s))  Urine Culture     Status: Abnormal   Collection Time: 06/28/20  4:13 PM   Specimen: Urine, Random  Result Value Ref Range Status   Specimen Description URINE,  RANDOM  Final   Special Requests NONE  Final   Culture >=100,000 COLONIES/mL YEAST (A)  Final   Report Status 07/01/2020 FINAL  Final  CULTURE, BLOOD (ROUTINE X 2) w Reflex to ID Panel     Status: None   Collection Time: 06/28/20  5:05 PM   Specimen: BLOOD  Result Value Ref Range Status   Specimen Description BLOOD RIGHT HAND  Final   Special Requests   Final    BOTTLES DRAWN AEROBIC AND ANAEROBIC Blood Culture results may not be optimal due to an excessive volume of blood received in culture bottles   Culture   Final    NO GROWTH 5 DAYS Performed at [facility], [address] [place], [place] [address]    Report Status 07/03/2020 FINAL  Final  CULTURE, BLOOD (ROUTINE X 2) w Reflex to ID Panel     Status: None   Collection Time: 06/28/20  5:05 PM   Specimen: BLOOD  Result Value Ref Range Status   Specimen Description BLOOD  LEFT ANTECUBITAL  Final   Special Requests   Final    BOTTLES DRAWN AEROBIC AND ANAEROBIC Blood Culture adequate volume   Culture   Final    NO GROWTH 5 DAYS Performed at [facility], [address] [place], [place] [address]    Report Status 07/03/2020 FINAL  Final  MRSA Next Gen by PCR, Nasal     Status: None   Collection Time: 06/29/20  5:10 PM  Result Value Ref Range Status   MRSA by PCR Next Gen NOT DETECTED NOT DETECTED Final    Comment: (NOTE) The GeneXpert MRSA Assay (FDA approved for NASAL specimens only), is one component of a comprehensive MRSA colonization surveillance program. It is not intended to diagnose MRSA infection nor to guide or monitor treatment for MRSA infections. Test performance is not FDA approved in patients less than 51 years old. Performed at [facility], [address] [place],  [address]       Imaging Studies   No results found.   Medications   Scheduled Meds:  amLODipine  5 mg Per Tube Daily   chlorhexidine gluconate (MEDLINE KIT)  15 mL Mouth Rinse BID   Chlorhexidine Gluconate Cloth  6 each Topical Daily   cloNIDine  0.3 mg Transdermal Weekly   free water  150 mL Per Tube Q4H   gabapentin  100 mg Per Tube Q8H   heparin injection (subcutaneous)  5,000 Units Subcutaneous Q8H   insulin aspart  0-20 Units Subcutaneous Q4H   insulin aspart  3 Units Subcutaneous Q4H   insulin glargine  20 Units Subcutaneous Daily   metoprolol tartrate  25 mg Per Tube BID   multivitamin  1 tablet Per Tube QHS   nutrition supplement (JUVEN)  1 packet Per Tube BID   pantoprazole sodium  40 mg Per Tube Daily   prazosin  4 mg Per Tube QHS   QUEtiapine  12.5 mg Per Tube BID   sodium chloride flush  10-40 mL Intracatheter Q12H   traZODone  150 mg Per Tube QHS   Continuous Infusions:  sodium chloride Stopped (06/22/20 1614)   feeding supplement (NEPRO CARB STEADY) 50 mL/hr at 07/08/20 0500       LOS: 34 days    Time  spent: 20 minutes    Ezekiel Slocumb, DO [facility]  07/08/2020, 1:26 PM      If 7PM-7AM, please contact night-coverage. How to contact the [facility]  Attending or Consulting provider [place] or covering provider during after hours [place], for this patient?    Check the care team in [facility] and look for a) attending/consulting TR[facility] provider listed and b) the [facility] team listed Log into [email or web address] and use [place]'s universal password to access. If you do not have the password, please contact the hospital operator. Locate the [facility] provider you are looking for under [facility] and page to a number that you can be directly reached. If you still have difficulty reaching the provider, please page the [facility] (Director on Call) for the Hospitalists listed on amion for assistance.

## 2020-07-08 NOTE — Plan of Care (Signed)
  Problem: Elimination: Goal: Will not experience complications related to urinary retention Outcome: Progressing   Problem: Safety: Goal: Ability to remain free from injury will improve Outcome: Progressing   Problem: Education: Goal: Knowledge of General Education information will improve Description: Including pain rating scale, medication(s)/side effects and non-pharmacologic comfort measures Outcome: Not Progressing, unable to understand teaching

## 2020-07-09 DIAGNOSIS — G9341 Metabolic encephalopathy: Secondary | ICD-10-CM | POA: Diagnosis not present

## 2020-07-09 DIAGNOSIS — N179 Acute kidney failure, unspecified: Secondary | ICD-10-CM | POA: Diagnosis not present

## 2020-07-09 DIAGNOSIS — N319 Neuromuscular dysfunction of bladder, unspecified: Secondary | ICD-10-CM | POA: Diagnosis not present

## 2020-07-09 LAB — BASIC METABOLIC PANEL
Anion gap: 7 (ref 5–15)
BUN: 118 mg/dL — ABNORMAL HIGH (ref 8–23)
CO2: 28 mmol/L (ref 22–32)
Calcium: 10.8 mg/dL — ABNORMAL HIGH (ref 8.9–10.3)
Chloride: 105 mmol/L (ref 98–111)
Creatinine, Ser: 3.23 mg/dL — ABNORMAL HIGH (ref 0.61–1.24)
GFR, Estimated: 21 mL/min — ABNORMAL LOW (ref >60)
Glucose, Bld: 183 mg/dL — ABNORMAL HIGH (ref 70–99)
Potassium: 3.8 mmol/L (ref 3.5–5.1)
Sodium: 140 mmol/L (ref 135–145)

## 2020-07-09 LAB — GLUCOSE, CAPILLARY
Glucose-Capillary: 107 mg/dL — ABNORMAL HIGH (ref 70–99)
Glucose-Capillary: 123 mg/dL — ABNORMAL HIGH (ref 70–99)
Glucose-Capillary: 126 mg/dL — ABNORMAL HIGH (ref 70–99)
Glucose-Capillary: 146 mg/dL — ABNORMAL HIGH (ref 70–99)
Glucose-Capillary: 154 mg/dL — ABNORMAL HIGH (ref 70–99)
Glucose-Capillary: 156 mg/dL — ABNORMAL HIGH (ref 70–99)

## 2020-07-09 MED ORDER — ALTEPLASE 2 MG IJ SOLR
2.0000 mg | Freq: Once | INTRAMUSCULAR | Status: DC
  Filled 2020-07-09: qty 2

## 2020-07-09 NOTE — Progress Notes (Signed)
IV team consulted due to picc lines hard to flush during shift. Did not push forcefully at risk of clot. Dressing change also due. IV team Rn came and advised that push with more force and blood returned obtain. RN stated IV dressing was good. IV team RN advised that it was outdated. RN stated it was intact and not needing changed. RN advised to leave stat lock supplies due to it needed to be changed. Dressing changed by primary RN

## 2020-07-09 NOTE — Progress Notes (Signed)
Occupational Therapy Treatment Patient Details Name: Wyatt Ball. MRN: [id number] DOB: 1960-10-12 Today's Date: 07/09/2020    History of present illness Gaspar Garbe Offord Montez Hageman. is a 60 y.o. 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  Since hospitalization pt has been intubated, finally extubated 6/15.   OT comments  Pt seen for OT treatment on this date. Upon arrival to room pt awake, and greets this Chartered loss adjuster by saying hello. Pt mentation improved from past OT sessions. On this date, he is able to follow ~60% of simple VCs with increased time/prompting, and participates in simple conversation with this Chartered loss adjuster. OT facilitates UB grooming tasks as described below (See ADL section for additional detail). Pt able to complete oral care with set-up/supervision, and face washing with MOD A to facilitate active functional use of his RUE this date. Pt making good progress toward goals and continues to benefit from skilled OT services to maximize return to PLOF and minimize risk of future falls, injury, caregiver burden, and readmission. Will continue to follow POC. Discharge recommendation remains appropriate.    Follow Up Recommendations  LTACH    Equipment Recommendations  Other (comment)    Recommendations for Other Services      Precautions / Restrictions Precautions Precautions: Fall Restrictions Weight Bearing Restrictions: No       Mobility Bed Mobility Overal bed mobility: Needs Assistance Bed Mobility: Rolling Rolling: Max assist         General bed mobility comments: Pt with limited participation in bed mobility.    Transfers                       Balance Overall balance assessment: Needs assistance                                         ADL either performed or assessed with clinical judgement   ADL Overall ADL's : Needs assistance/impaired Eating/Feeding: NPO Eating/Feeding Details (indicate cue type and reason): G-tube Grooming: Wash/dry face;Oral care;Moderate assistance;Bed level;Brushing hair Grooming Details (indicate cue type and reason): Pt more awake/alert this date. He is able to participate in bed-level grooming tasks meaningfully following single step VCs with increased time/prompting. Pt able to use sponge to complete oral care with LUE with set-up/sup. He c/o of increased weakness in RUE when attempting to complete face washing. RUE is notably weaker than LUE with decreased active shoulder flexion/adduction. Pt requires MOD A to bring RUE to face to assist with face washing.                               General ADL Comments: Pt continues to require MAX-TOTAL A for LB ADL management including bed level toileting and LB bathing.     Vision Patient Visual Report: No change from baseline     Perception     Praxis      Cognition Arousal/Alertness: Awake/alert Behavior During Therapy: WFL for tasks assessed/performed Overall Cognitive Status: No family/caregiver present to determine baseline cognitive functioning  General Comments: Pt more alert/engaged during session. He is able to follow simple VCs and basic conversation during session. He answers questions about himself both verbally and through hand signals. Pt states he enjoys fishing and hunting as hobbies.        Exercises Other Exercises Other Exercises: OT facilitates bed-level grooming tasks including oral care (with sponge/oral gel), face washing, hair combing, Pt requires MOD assist. See ADL section for additional details. Gentle PROM/AAROM of RUE: shoulder  flexion, AB/ABduction, horizontal Adduction, and elbow flexion/extension.   Shoulder Instructions       General Comments      Pertinent Vitals/ Pain       Pain Assessment: Faces Faces Pain Scale: Hurts little more Pain Location: BUE/BLE with movement. Pain Descriptors / Indicators: Grimacing Pain Intervention(s): Limited activity within patient's tolerance;Monitored during session;Repositioned  Home Living                                          Prior Functioning/Environment              Frequency  Min 1X/week        Progress Toward Goals  OT Goals(current goals can now be found in the care plan section)  Progress towards OT goals: Progressing toward goals  Acute Rehab OT Goals Patient Stated Goal: To drink a diet coke. OT Goal Formulation: With patient Time For Goal Achievement: 07/19/20 Potential to Achieve Goals: Poor  Plan Discharge plan remains appropriate;Frequency remains appropriate    Co-evaluation                 AM-PAC OT "6 Clicks" Daily Activity     Outcome Measure   Help from another person eating meals?: Total Help from another person taking care of personal grooming?: A Lot Help from another person toileting, which includes using toliet, bedpan, or urinal?: Total Help from another person bathing (including washing, rinsing, drying)?: Total Help from another person to put on and taking off regular upper body clothing?: Total Help from another person to put on and taking off regular lower body clothing?: Total 6 Click Score: 7    End of Session    OT Visit Diagnosis: Other symptoms and signs involving cognitive function   Activity Tolerance Patient tolerated treatment well   Patient Left in bed;with call bell/phone within reach;with bed alarm set   Nurse Communication Mobility status        Time: 1340-1404 OT Time Calculation (min): 24 min  Charges: OT General Charges $OT Visit: 1 Visit OT  Treatments $Self Care/Home Management : 8-22 mins $Therapeutic Activity: 8-22 mins  Rockney Ghee, M.S., OTR/L Ascom: [phone] 07/09/20, 4:08 PM

## 2020-07-09 NOTE — Progress Notes (Signed)
PROGRESS NOTE    Wyatt Ball.   BTD:[id number]  DOB: 1961-01-02  PCP: System, Provider Not In    DOA: 06/04/2020 LOS: 35   Assessment & Plan   Principal Problem:   CAP (community acquired pneumonia) Active Problems:   Acute metabolic encephalopathy   Severe sepsis with septic shock ([place])   Neurogenic bladder   Chronic, continuous use of opioids   Chronic low back pain   Type 2 diabetes mellitus without complication (HCC)   Migraines   Recurrent UTI   History of colon polyps   Urinary retention   Altered mental status   Pressure injury of skin   AKI (acute kidney injury) ([place])   Septic shock secondary to community-acquired pneumonia: Status post 8 days azithromycin, 3 days of cephalosporin plus 5 days of IV meropenem followed by 7 days of Levaquin and doxycycline.   S/p bronchoscopy blood cultures were negative.   No growth on blood cultures . Monitor closely.   New fever and tachycardia on 06/28/2020, new sepsis secondary to bilateral pneumonia: MRSA screen was negative.  No growth on repeat blood cultures.  He was treated with IV vancomycin from 06/28/2020 through 06/30/2020 and IV meropenem from 06/28/2020 through 07/01/2020.  Urine culture from 06/28/2020 showed yeast.  Of note, patient had previously been treated with IV fluconazole from 06/25/2020 through 06/30/2020.   Acute hypoxic respiratory failure: Due to PNA. Resolved.  S/p intubation and extubation.     AKI, hyperphosphatemia: stable, Cr is slowly improving.   Good UO.  Hemodialysis was initiated on 06/10/2020, now off and HD catheter removed. No plan for further hemodialysis.  He is not a good candidate for long-term dialysis per nephrologist.     Hypernatremia: Resolved with adjusting of free water flushes after G tube placed.     Na stable on 150 cc q4h - continue for now. Monitor BMP.   Hypokalemia - K 3.3 on 7/4, replaced per G tube.  Monitor and replace PRN.  Acute metabolic encephalopathy,  agitation, history of PTSD: Patient has been evaluated by neurologist.  EEG was unremarkable.   --Continue psychotropics and prazosin   Inadequate PO intake due to Dysphagia - inability to eat due to acute prolonged illness as above. --G tube placed 07/03/20 --Tube feeds continuous, per RD orders --NPO status  Hypertension: Continue antihypertensives   Stage II coccygeal decubitus ulcer: Present on admission: Continue foam dressing.  Frequent repositioning. Monitor closely. Pressure Injury 06/23/20 Coccyx Posterior;Mid Stage 2 -  Partial thickness loss of dermis presenting as a shallow open injury with a red, pink wound bed without slough. (Active)  06/23/20 1400  Location: Coccyx  Location Orientation: Posterior;Mid  Staging: Stage 2 -  Partial thickness loss of dermis presenting as a shallow open injury with a red, pink wound bed without slough.  Wound Description (Comments):   Present on Admission: Yes     Hyperkalemia: Resolved   Neurogenic bladder due to hx of transverse myelitis  Chronic pain syndrome on opioids   Obesity: Body mass index is 28.87 kg/m.  Complicates overall care and prognosis.  Recommend lifestyle modifications including physical activity and diet for weight loss and overall long-term health.   DVT prophylaxis: heparin injection 5,000 Units Start: 06/10/20 2200   Diet:     Code Status: DNR   Brief Narrative / Hospital Course to Date:   Medical records reviewed and are as summarized below:  Wyatt Delshawn Basic Brooke Bonito. is a 60 y.o. male with medical history significant for hypertension,  PTSD, diabetes, migraine, GERD, neurogenic bladder secondary to remote history of transverse myelitis requiring self urethral catheterization, frequent UTIs, chronic back pain on opioids, generally independent at baseline.  He usually gets his care at the [facility] hospital.  He was brought to the emergency room because of altered mental status, cough, shortness of breath, fever,  vomiting and diarrhea.   He was admitted to the hospital for sepsis secondary to pneumonia.  He was transferred to the ICU for septic shock, AKI, COPD exacerbation.  He was intubated and placed on mechanical ventilation.  He was treated with empiric IV antibiotics including IV fluconazole for yeast in sputum, IV fluids.  NG tube was placed for enteral nutrition and medications because of severe dysphagia.  Hemodialysis was discontinued because he is not a candidate for long-term hemodialysis per nephrologist.   5/21 admitted for sepsis and pneumonia. 6/2 transferred to ICU, intubated underwent bronchoscopy 6/3 self extubated followed by reintubated 6/7 HD started 6/15 extubated 6/19 transferred to [facility]. Neurology, nephrology, urology and infectious disease consulted.     After transfer to progressive care unit, he developed fever and tachycardia that was concerning for sepsis on 06/28/2020.  He was restarted on IV antibiotics (meropenem and vancomycin).  Repeat blood culture did not show any growth.  Gastroenterologist was consulted for PEG tube placement per family's request.    Subjective 07/09/20    Bedside RN reported patient agitated and cursing overnight / this AM early, given Haldol and doing better today so far.  Pt denies complaints.  Asks for his legs to be elevated.  Disposition Plan & Communication   Status is: Inpatient  Remains inpatient appropriate because:Inpatient level of care appropriate due to severity of illness with encephalopathy which is very slowly improving.  Dispo: The patient is from: Home              Anticipated d/c is to: SNF vs LTAC              Patient currently is not medically stable to d/c.   Difficult to place patient No   Consults, Procedures, Significant Events   Consultants:  Gastroenterology Palliative care Nephrology  Procedures:  As above  Antimicrobials:  Anti-infectives (From admission, onward)    Start     Dose/Rate Route  Frequency Ordered Stop   06/28/20 1815  vancomycin (VANCOREADY) IVPB 1750 mg/350 mL        1,750 mg 175 mL/hr over 120 Minutes Intravenous  Once 06/28/20 1726 06/28/20 2054   06/28/20 1815  meropenem (MERREM) 1 g in sodium chloride 0.9 % 100 mL IVPB  Status:  Discontinued        1 g 200 mL/hr over 30 Minutes Intravenous Every 12 hours 06/28/20 1726 07/01/20 1558   06/28/20 1742  vancomycin variable dose per unstable renal function (pharmacist dosing)  Status:  Discontinued         Does not apply See admin instructions 06/28/20 1742 06/30/20 0757   06/26/20 2100  fluconazole (DIFLUCAN) IVPB 50 mg       See Hyperspace for full Linked Orders Report.   50 mg 25 mL/hr over 60 Minutes Intravenous Every 24 hours 06/25/20 1835 06/30/20 2118   06/25/20 2000  fluconazole (DIFLUCAN) IVPB 200 mg       See Hyperspace for full Linked Orders Report.   200 mg 100 mL/hr over 60 Minutes Intravenous  Once 06/25/20 1835 06/25/20 2348   06/14/20 1800  levofloxacin (LEVAQUIN) IVPB 500 mg  500 mg 100 mL/hr over 60 Minutes Intravenous Every 48 hours 06/12/20 1624 06/19/20 0210   06/12/20 1800  levofloxacin (LEVAQUIN) IVPB 750 mg        750 mg 100 mL/hr over 90 Minutes Intravenous  Once 06/11/20 2239 06/12/20 1904   06/11/20 1800  meropenem (MERREM) 500 mg in sodium chloride 0.9 % 100 mL IVPB  Status:  Discontinued        500 mg 200 mL/hr over 30 Minutes Intravenous Every 24 hours 06/10/20 1444 06/11/20 2231   06/10/20 1300  doxycycline (VIBRAMYCIN) 100 mg in sodium chloride 0.9 % 250 mL IVPB  Status:  Discontinued        100 mg 125 mL/hr over 120 Minutes Intravenous Every 12 hours 06/10/20 1149 06/17/20 1704   06/09/20 1015  meropenem (MERREM) 500 mg in sodium chloride 0.9 % 100 mL IVPB        500 mg 200 mL/hr over 30 Minutes Intravenous Every 12 hours 06/09/20 0922 06/11/20 1203   06/08/20 2200  meropenem (MERREM) 1 g in sodium chloride 0.9 % 100 mL IVPB  Status:  Discontinued        1 g 200 mL/hr  over 30 Minutes Intravenous Every 12 hours 06/08/20 0845 06/09/20 0922   06/07/20 2200  azithromycin (ZITHROMAX) 500 mg in sodium chloride 0.9 % 250 mL IVPB        500 mg 250 mL/hr over 60 Minutes Intravenous Every 24 hours 06/07/20 1441 06/11/20 2211   06/07/20 1600  ceFEPIme (MAXIPIME) 2 g in sodium chloride 0.9 % 100 mL IVPB  Status:  Discontinued        2 g 200 mL/hr over 30 Minutes Intravenous Every 8 hours 06/07/20 1441 06/07/20 1442   06/07/20 1600  meropenem (MERREM) 1 g in sodium chloride 0.9 % 100 mL IVPB  Status:  Discontinued        1 g 200 mL/hr over 30 Minutes Intravenous Every 8 hours 06/07/20 1442 06/08/20 0845   06/06/20 1630  vancomycin (VANCOREADY) IVPB 1500 mg/300 mL  Status:  Discontinued        1,500 mg 150 mL/hr over 120 Minutes Intravenous  Once 06/06/20 1533 06/06/20 1632   06/05/20 0600  cefTRIAXone (ROCEPHIN) 2 g in sodium chloride 0.9 % 100 mL IVPB  Status:  Discontinued        2 g 200 mL/hr over 30 Minutes Intravenous Every 24 hours 06/04/20 2029 06/07/20 1439   06/04/20 2200  azithromycin (ZITHROMAX) 500 mg in sodium chloride 0.9 % 250 mL IVPB        500 mg 250 mL/hr over 60 Minutes Intravenous Every 24 hours 06/04/20 2029 06/07/20 0003   06/04/20 1915  ceFEPIme (MAXIPIME) 2 g in sodium chloride 0.9 % 100 mL IVPB        2 g 200 mL/hr over 30 Minutes Intravenous  Once 06/04/20 1906 06/04/20 2055   06/04/20 1915  metroNIDAZOLE (FLAGYL) IVPB 500 mg        500 mg 100 mL/hr over 60 Minutes Intravenous  Once 06/04/20 1906 06/04/20 2055   06/04/20 1915  vancomycin (VANCOCIN) IVPB 1000 mg/200 mL premix  Status:  Discontinued        1,000 mg 200 mL/hr over 60 Minutes Intravenous  Once 06/04/20 1906 06/04/20 1912   06/04/20 1915  vancomycin (VANCOREADY) IVPB 1750 mg/350 mL        1,750 mg 175 mL/hr over 120 Minutes Intravenous  Once 06/04/20 1912 06/04/20 2257  Micro    Objective   Vitals:   07/09/20 0500 07/09/20 0551 07/09/20 0819 07/09/20 1055   BP:  136/68 (!) 158/81 (!) 141/91  Pulse:  86 88 79  Resp:  18 17 18   Temp:  97.6 F (36.4 C) 98.3 F (36.8 C) 97.8 F (36.6 C)  TempSrc:      SpO2:  95% 100% 100%  Weight: 81.1 kg     Height:        Intake/Output Summary (Last 24 hours) at 07/09/2020 1313 Last data filed at 07/09/2020 5397 Gross per 24 hour  Intake 500 ml  Output 2850 ml  Net -2350 ml   Filed Weights   07/07/20 0416 07/08/20 0500 07/09/20 0500  Weight: 85.3 kg 82.2 kg 81.1 kg    Physical Exam:  General exam: awake and alert, no acute distress, chronically ill-appearing Respiratory system: CTAB, no wheezes or rhonchi, on room air. Cardiovascular system: normal S1/S2, RRR, no peripheral edema   Gastrointestinal system: soft, NT, ND G-tube present with continuous tube feeds running Neuro: no gross focal deficits, normal speech with little better vocal projection today Extremities: no LE edema, offloading boots on feet, moves all   Labs   Data Reviewed: I have personally reviewed following labs and imaging studies  CBC: Recent Labs  Lab 07/08/20 0609  WBC 7.9  HGB 8.7*  HCT 25.6*  MCV 88.3  PLT 673   Basic Metabolic Panel: Recent Labs  Lab 07/05/20 0410 07/06/20 0530 07/07/20 0914 07/08/20 0609 07/09/20 0602  NA 151* 145 141 140 140  K 3.9 3.5 3.8 3.3* 3.8  CL 116* 110 106 104 105  CO2 26 25 26 27 28   GLUCOSE 147* 148* 168* 162* 183*  BUN 130* 124* 134* 119* 118*  CREATININE 4.10* 3.79* 3.30* 3.30* 3.23*  CALCIUM 10.0 9.6 10.2 10.0 10.8*  MG 2.5*  --  2.3  --   --   PHOS 6.3*  --   --  4.2  --    GFR: Estimated Creatinine Clearance: 24 mL/min (A) (by C-G formula based on SCr of 3.23 mg/dL (H)). Liver Function Tests: No results for input(s): AST, ALT, ALKPHOS, BILITOT, PROT, ALBUMIN in the last 168 hours.  No results for input(s): LIPASE, AMYLASE in the last 168 hours. No results for input(s): AMMONIA in the last 168 hours. Coagulation Profile: No results for input(s): INR, PROTIME  in the last 168 hours. Cardiac Enzymes: No results for input(s): CKTOTAL, CKMB, CKMBINDEX, TROPONINI in the last 168 hours. BNP (last 3 results) No results for input(s): PROBNP in the last 8760 hours. HbA1C: No results for input(s): HGBA1C in the last 72 hours. CBG: Recent Labs  Lab 07/08/20 2117 07/09/20 0016 07/09/20 0512 07/09/20 0824 07/09/20 1056  GLUCAP 112* 123* 154* 126* 146*   Lipid Profile: No results for input(s): CHOL, HDL, LDLCALC, TRIG, CHOLHDL, LDLDIRECT in the last 72 hours. Thyroid Function Tests: No results for input(s): TSH, T4TOTAL, FREET4, T3FREE, THYROIDAB in the last 72 hours. Anemia Panel: No results for input(s): VITAMINB12, FOLATE, FERRITIN, TIBC, IRON, RETICCTPCT in the last 72 hours. Sepsis Labs: No results for input(s): PROCALCITON, LATICACIDVEN in the last 168 hours.   Recent Results (from the past 240 hour(s))  MRSA Next Gen by PCR, Nasal     Status: None   Collection Time: 06/29/20  5:10 PM  Result Value Ref Range Status   MRSA by PCR Next Gen NOT DETECTED NOT DETECTED Final    Comment: (NOTE) The GeneXpert MRSA  Assay (FDA approved for NASAL specimens only), is one component of a comprehensive MRSA colonization surveillance program. It is not intended to diagnose MRSA infection nor to guide or monitor treatment for MRSA infections. Test performance is not FDA approved in patients less than 42 years old. Performed at [facility], [address], [place], [place] [address]       Imaging Studies   No results found.   Medications   Scheduled Meds:  amLODipine  5 mg Per Tube Daily   chlorhexidine gluconate (MEDLINE KIT)  15 mL Mouth Rinse BID   Chlorhexidine Gluconate Cloth  6 each Topical Daily   cloNIDine  0.3 mg Transdermal Weekly   free water  150 mL Per Tube Q4H   gabapentin  100 mg Per Tube Q8H   heparin injection (subcutaneous)  5,000 Units Subcutaneous Q8H   insulin aspart  0-20 Units Subcutaneous Q4H   insulin  aspart  3 Units Subcutaneous Q4H   insulin glargine  20 Units Subcutaneous Daily   metoprolol tartrate  25 mg Per Tube BID   multivitamin  1 tablet Per Tube QHS   nutrition supplement (JUVEN)  1 packet Per Tube BID   pantoprazole sodium  40 mg Per Tube Daily   prazosin  4 mg Per Tube QHS   QUEtiapine  12.5 mg Per Tube BID   sodium chloride flush  10-40 mL Intracatheter Q12H   traZODone  150 mg Per Tube QHS   Continuous Infusions:  sodium chloride Stopped (06/22/20 1614)   feeding supplement (NEPRO CARB STEADY) 1,000 mL (07/08/20 1657)       LOS: 35 days    Time spent: 20 minutes    Ezekiel Slocumb, DO [facility]  07/09/2020, 1:13 PM      If 7PM-7AM, please contact night-coverage. How to contact the [facility] Attending or Consulting provider [place] or covering provider during after hours [place], for this patient?    Check the care team in [facility] and look for a) attending/consulting TRH provider listed and b) the [facility] team listed Log into [email or web address] and use [place]'s universal password to access. If you do not have the password, please contact the hospital operator. Locate the [facility] provider you are looking for under [facility] and page to a number that you can be directly reached. If you still have difficulty reaching the provider, please page the [facility] (Director on Call) for the Hospitalists listed on amion for assistance.

## 2020-07-09 NOTE — Plan of Care (Signed)

## 2020-07-10 DIAGNOSIS — E440 Moderate protein-calorie malnutrition: Secondary | ICD-10-CM | POA: Insufficient documentation

## 2020-07-10 DIAGNOSIS — J189 Pneumonia, unspecified organism: Secondary | ICD-10-CM | POA: Diagnosis not present

## 2020-07-10 LAB — GLUCOSE, CAPILLARY
Glucose-Capillary: 123 mg/dL — ABNORMAL HIGH (ref 70–99)
Glucose-Capillary: 136 mg/dL — ABNORMAL HIGH (ref 70–99)
Glucose-Capillary: 144 mg/dL — ABNORMAL HIGH (ref 70–99)
Glucose-Capillary: 153 mg/dL — ABNORMAL HIGH (ref 70–99)
Glucose-Capillary: 164 mg/dL — ABNORMAL HIGH (ref 70–99)
Glucose-Capillary: 180 mg/dL — ABNORMAL HIGH (ref 70–99)

## 2020-07-10 LAB — BASIC METABOLIC PANEL
Anion gap: 9 (ref 5–15)
BUN: 109 mg/dL — ABNORMAL HIGH (ref 8–23)
CO2: 29 mmol/L (ref 22–32)
Calcium: 11.5 mg/dL — ABNORMAL HIGH (ref 8.9–10.3)
Chloride: 106 mmol/L (ref 98–111)
Creatinine, Ser: 3.1 mg/dL — ABNORMAL HIGH (ref 0.61–1.24)
GFR, Estimated: 22 mL/min — ABNORMAL LOW (ref >60)
Glucose, Bld: 137 mg/dL — ABNORMAL HIGH (ref 70–99)
Potassium: 4.1 mmol/L (ref 3.5–5.1)
Sodium: 144 mmol/L (ref 135–145)

## 2020-07-10 MED ORDER — PROSOURCE TF PO LIQD
45.0000 mL | Freq: Every day | ORAL | Status: DC
  Administered 2020-07-10 – 2020-07-23 (×14): 45 mL
  Filled 2020-07-10 (×15): qty 45

## 2020-07-10 MED ORDER — NEPRO/CARBSTEADY PO LIQD
1000.0000 mL | ORAL | Status: DC
  Administered 2020-07-10 – 2020-07-16 (×3): 1000 mL

## 2020-07-10 NOTE — Evaluation (Signed)
Speech Language Pathology Evaluation Patient Details Name: Wyatt Ball. MRN: [id number] DOB: 1960/03/24 Today's Date: 07/10/2020 Time: 9798-9211 SLP Time Calculation (min) (ACUTE ONLY): 12 min  Problem List:  Patient Active Problem List   Diagnosis Date Noted   AKI (acute kidney injury) (HCC) 06/26/2020   Pressure injury of skin 06/25/2020   Altered mental status    Urinary retention    Acute metabolic encephalopathy 06/04/2020   Severe sepsis with septic shock (HCC) 06/04/2020   CAP (community acquired pneumonia) 06/04/2020   Neurogenic bladder 06/04/2020   Chronic, continuous use of opioids 06/04/2020   Chronic low back pain 06/04/2020   Type 2 diabetes mellitus without complication (HCC) 06/04/2020   Migraines 06/04/2020   Recurrent UTI 06/04/2020   History of colon polyps 06/04/2020   Past Medical History:  Past Medical History:  Diagnosis Date   Anxiety    DM (diabetes mellitus) (HCC)    HTN (hypertension)    Transverse myelitis (HCC)    Past Surgical History:  Past Surgical History:  Procedure Laterality Date   PEG PLACEMENT N/A 07/03/2020   Procedure: PERCUTANEOUS ENDOSCOPIC GASTROSTOMY (PEG) PLACEMENT;  Surgeon: [name], MD;  Location: [facility]COPY;  Service: Gastroenterology;  Laterality: N/A;   TONSILLECTOMY     TOTAL HIP ARTHROPLASTY     HPI:  Wyatt Higashi Montez Hageman. is a 60 y.o. 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 Pt intubated from 06/06/2020 thru 06/19/2020, PEG placed 07/03/2020.   Assessment / Plan / Recommendation Clinical Impression  Pt presents with moderate to severe cognitive  deficits. Currently is demonstrates impairments in attention, task initiation, processing speed, basic problem solving, orientation to month, year and overall awareness and insight into deficits. He also presents as mildly HOH. It is difficult to determine the extent of pt's cognitive impairments because there were multiple times throughout the evaluation that pt appeared to volitionally no respond to questions. For example, when asked yes/no questions about location, pt didn't respond. However, when instructed that I needed to hear pt's voice after he swallowed, he stated, "I know we are at [place]." When questions centered around himself, such as asking about his back problems, he was more forth coming with sentence length utterances, "I have acute transverse myelitis." His strengths lie in orientation to himself and location, ability to speak in sentence length utterances, follow 1 step directions and express basic wants needs ("How do I make this bed go back? I need some more of this - pointing to cup of diet cola."). Skilled ST intervention is appropriate while pt is acute to increase his ability to participate in therapies and improve pt's discharge plan.    SLP Assessment  SLP Recommendation/Assessment: Patient needs continued Speech Lanaguage Pathology Services SLP Visit Diagnosis: Cognitive communication deficit (R41.841)    Follow Up Recommendations  Skilled Nursing facility    Frequency and Duration min 2x/week  2 weeks      SLP Evaluation Cognition  Overall Cognitive Status: Impaired/Different from baseline Arousal/Alertness: Awake/alert Orientation Level: Oriented to person;Oriented to place;Disoriented to time;Disoriented to situation Attention: Sustained Sustained Attention: Impaired Sustained Attention Impairment: Verbal basic;Functional basic Memory: Impaired Memory Impairment: Decreased recall of new  information;Decreased short term memory Decreased Short Term Memory: Verbal  basic;Functional basic Awareness: Impaired Awareness Impairment: Intellectual impairment Problem Solving: Impaired Problem Solving Impairment: Verbal basic;Functional basic Executive Function:  (all impaired by lower level deficits) Behaviors:  (volitionally not responding to questions) Safety/Judgment: Impaired       Comprehension  Auditory Comprehension Overall Auditory Comprehension: Appears within functional limits for tasks assessed Visual Recognition/Discrimination Discrimination: Not tested Reading Comprehension Reading Status: Not tested    Expression Expression Primary Mode of Expression: Verbal Verbal Expression Overall Verbal Expression: Impaired Initiation: Impaired Automatic Speech: Name Level of Generative/Spontaneous Verbalization: Phrase;Sentence Pragmatics: Impairment Impairments: Abnormal affect;Dysprosody;Eye contact;Monotone Non-Verbal Means of Communication: Not applicable Written Expression Dominant Hand: Right Written Expression: Not tested   Oral / Motor  Oral Motor/Sensory Function Overall Oral Motor/Sensory Function: Within functional limits Motor Speech Overall Motor Speech: Appears within functional limits for tasks assessed Respiration: Within functional limits Phonation: Normal Resonance: Within functional limits Articulation: Within functional limitis Intelligibility: Intelligible Motor Planning: Witnin functional limits Motor Speech Errors: Not applicable   GO                   Myleen Brailsford B. Dreama Ball M.S., CCC-SLP, [facility] Speech-Language Pathologist Rehabilitation Services Office [phone]  [name] 07/10/2020, 4:40 PM

## 2020-07-10 NOTE — Evaluation (Signed)
Clinical/Bedside Swallow Evaluation Patient Details  Name: Wyatt Ball. MRN: [id number] Date of Birth: 1960-03-06  Today's Date: 07/10/2020 Time: SLP Start Time (ACUTE ONLY): 1217 SLP Stop Time (ACUTE ONLY): 1230 SLP Time Calculation (min) (ACUTE ONLY): 13 min  Past Medical History:  Past Medical History:  Diagnosis Date   Anxiety    DM (diabetes mellitus) (HCC)    HTN (hypertension)    Transverse myelitis (HCC)    Past Surgical History:  Past Surgical History:  Procedure Laterality Date   PEG PLACEMENT N/A 07/03/2020   Procedure: PERCUTANEOUS ENDOSCOPIC GASTROSTOMY (PEG) PLACEMENT;  Surgeon: [name], MD;  Location: [facility] ENDOSCOPY;  Service: Gastroenterology;  Laterality: N/A;   TONSILLECTOMY     TOTAL HIP ARTHROPLASTY     HPI:  Wyatt Hardge Montez Ball. is a 60 y.o. 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 Pt intubated from 06/06/2020 thru 06/19/2020, PEG placed 07/03/2020.   Assessment / Plan / Recommendation Clinical Impression  Pt presents with adequate oropharyngeal abilities when consuming ice chips and thin liquids via straw. Pt consumed ~ 16 oz of thin liquids without any overt s/s of aspiration, vocal quality clear throughout and his swallow response appeared swift. When given trials of applesauce, pt didn't open his mouth or make any responses to assess for clear vocal quality and complete oral clearing. At this time, pt is appropriate for a clear liquid diet and he may use a straw with STRICT ASPIRATION precautions. ST will follow for further diet advancement. SLP Visit Diagnosis: Dysphagia, oropharyngeal phase  (R13.12)    Aspiration Risk  Mild aspiration risk    Diet Recommendation Thin liquid (clear liquid diet)   Liquid Administration via: Straw Medication Administration: Via alternative means Supervision: Full supervision/cueing for compensatory strategies Compensations: Minimize environmental distractions;Slow rate;Small sips/bites Postural Changes: Seated upright at 90 degrees;Remain upright for at least 30 minutes after po intake    Other  Recommendations Oral Care Recommendations: Oral care QID   Follow up Recommendations Skilled Nursing facility      Frequency and Duration min 2x/week  2 weeks       Prognosis Prognosis for Safe Diet Advancement: Fair Barriers to Reach Goals: Cognitive deficits;Behavior;Motivation      Swallow Study   General Date of Onset: 06/04/20 HPI: Wyatt Moraes Montez Ball. is a 60 y.o. 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 Pt intubated from 06/06/2020 thru 06/19/2020, PEG placed 07/03/2020. Type of Study: Bedside Swallow Evaluation Previous Swallow Assessment: 06/06/2020 Diet Prior to this Study: NPO Temperature Spikes Noted: No Respiratory Status: Room air History of Recent Intubation: Yes Length of Intubations (days): 13 days Date extubated: 06/19/20 Behavior/Cognition: Alert;Uncooperative;Distractible;Requires cueing Oral Cavity Assessment: Dried secretions Oral Care Completed by SLP: Yes Oral Cavity - Dentition: Poor condition Vision: Functional for self-feeding Self-Feeding Abilities: Needs assist Patient Positioning: Upright in bed Baseline  Vocal Quality: Low vocal intensity Volitional  Cough: Strong Volitional Swallow: Able to elicit    Oral/Motor/Sensory Function Overall Oral Motor/Sensory Function: Within functional limits   Ice Chips Ice chips: Within functional limits Presentation: Spoon   Thin Liquid Thin Liquid: Within functional limits Presentation: Straw;Self Fed    Nectar Thick Nectar Thick Liquid: Not tested   Honey Thick Honey Thick Liquid: Not tested   Puree Puree: Impaired Presentation: Spoon Other Comments: see impression statement   Solid     Solid: Not tested     Wyatt Ball M.S., CCC-SLP, [facility] Speech-Language Pathologist Rehabilitation Services Office [phone]  Wyatt Ball 07/10/2020,4:34 PM

## 2020-07-10 NOTE — TOC Progression Note (Signed)
Transition of Care (TOC) - Progression Note    Patient Details  Name: Wyatt Ball. MRN: [id number] Date of Birth: 1960/03/30  Transition of Care [facility]) CM/SW Contact  Caryn Section, RN Phone Number: 07/10/2020, 4:19 PM  Clinical Narrative:   continuing to await response from [facility] for SNF authorization    Expected Discharge Plan: Skilled Nursing Facility Barriers to Discharge: Continued Medical Work up  Expected Discharge Plan and Services Expected Discharge Plan: Skilled Nursing Facility In-house Referral: NA   Post Acute Care Choice: Skilled Nursing Facility Living arrangements for the past 2 months: Single Family Home                                       Social Determinants of Health (SDOH) Interventions    Readmission Risk Interventions No flowsheet data found.

## 2020-07-10 NOTE — Progress Notes (Signed)
Nutrition Follow-up  DOCUMENTATION CODES:  Non-severe (moderate) malnutrition in context of chronic illness  INTERVENTION:  Continue TF, increase rate due to weight loss and increased muscle wasting Nepro 1.8 @ 55 ml/hr  Free water flushes 161m q 4 hours per MD Prosource TF 424mdaily (1 packet) provides 40kcal and 11g of protein Regimen provides 2376 kcal/day, 118g/day protein and 1860 mL/day free water (flushes + TF) Rena-vit daily via tube  Juven Fruit Punch BID via tube, each serving provides 95kcal and 2.5g of protein (amino acids glutamine and arginine)  NUTRITION DIAGNOSIS:  Moderate Malnutrition (in the context of chronic illness) related to inability to eat as evidenced by mild fat depletion, mild muscle depletion, moderate muscle depletion  - ongoing  GOAL:  Patient will meet greater than or equal to 90% of their needs  - met with TF  MONITOR:  TF tolerance, Labs, Weight trends  ASSESSMENT:  [age].o. male with h/o hypertension, PTSD, diabetes, migraines, GERD, neurogenic bladder secondary to remote history of transverse myelitis, requires self-catheterization and has frequent UTIs and opioids for chronic pain who is admitted on 06/04/2020 for CAP and sepsis   6/6 HD initiation 6/17 NGT replaced 6/24 NGT replaced again 6/27 - Nephrology signed off, no further HD 6/29 - PEG placed by GI  Palliative care met with family on 6/27 and decision was made to move forward with PEG placement as pt has not made any progress towards PO intake goals with SLP.  PEG placed by GI 6/29 and is now being used for feedings.   Pt resting in bed at the time of visit. NT checking glucose levels. Pt spoke intermittently but unable to answer nutrition inquiries due to mentation. Assessed pt's muscle and fat stores as he has been hospitalized for a prolonged period of time. Muscle and fat deficits detected that were not present on admission. Likely due to metabolic stress of illness as well as  decreased mobility/deconditioning. Will increase TF rate to provide additional nutrition.   Noted that renal indices slowly improving.   Nutritionally Relevant Medications: Scheduled Meds:  free water  150 mL Per Tube Q4H   insulin aspart  0-20 Units Subcutaneous Q4H   insulin aspart  3 Units Subcutaneous Q4H   insulin glargine  20 Units Subcutaneous Daily   multivitamin  1 tablet Per Tube QHS   nutrition supplement (JUVEN)  1 packet Per Tube BID   pantoprazole sodium  40 mg Per Tube Daily   Continuous Infusions:  feeding supplement (NEPRO CARB STEADY) 1,000 mL (07/09/20 1409)   PRN Meds: ondansetron  Labs Reviewed: BUN 118, creatinine 3.23 (7/5) SBG ranges from 107-180 mg/dL over the last 24 hours HgbA1c 7.4% (5/31)  Nutrition Focused Physical Exam Flowsheet Row Most Recent Value  Orbital Region Mild depletion  Upper Arm Region No depletion  Thoracic and Lumbar Region No depletion  Buccal Region Mild depletion  Temple Region Moderate depletion  Clavicle Bone Region Mild depletion  Clavicle and Acromion Bone Region Mild depletion  Scapular Bone Region Mild depletion  Dorsal Hand Mild depletion  Patellar Region Mild depletion  Anterior Thigh Region Mild depletion  Posterior Calf Region Moderate depletion  Edema (RD Assessment) None  Hair Reviewed  Eyes Reviewed  Mouth Reviewed  Skin Reviewed  Nails Reviewed   Diet Order:   Diet Order             Diet clear liquid Room service appropriate? Yes; Fluid consistency: Thin  Diet effective now  EDUCATION NEEDS:  No education needs have been identified at this time  Skin:  Skin Assessment: Skin Integrity Issues: (sacral wound 1.8 cm x 0.8 cm) Skin Integrity Issues:: Stage II Stage II: coccyx  Last BM:  7/4 - type 6  Height:  Ht Readings from Last 1 Encounters:  06/18/20 5' 5.98" (1.676 m)    Weight:  Wt Readings from Last 1 Encounters:  07/10/20 78.9 kg   Ideal Body Weight:  64.5  kg  BMI:  Body mass index is 28.09 kg/m.  Estimated Nutritional Needs:  Kcal:  2200-2400 kcal/d Protein:  110-125g/d Fluid:  2-2.2 L/d  Ranell Patrick, RD, LDN Clinical Dietitian Pager on [place]

## 2020-07-10 NOTE — Progress Notes (Signed)
PROGRESS NOTE    Wyatt Lenoard [employer].   WCH:[id number]  DOB: April 16, 1960  PCP: System, Provider Not In    DOA: 06/04/2020 LOS: 36   Assessment & Plan   Principal Problem:   CAP (community acquired pneumonia) Active Problems:   Acute metabolic encephalopathy   Severe sepsis with septic shock (HCC)   Neurogenic bladder   Chronic, continuous use of opioids   Chronic low back pain   Type 2 diabetes mellitus without complication (HCC)   Migraines   Recurrent UTI   History of colon polyps   Urinary retention   Altered mental status   Pressure injury of skin   AKI (acute kidney injury) ([place])   Malnutrition of moderate degree    Hyponatremia Sodium improved   Hypertension Controlled - Continue amlodipine, clonidine, hydralazine, metoprolol, prazosin  Diabetes Glucose is normal - Continue Lantus - Continue sliding scale corrections - Continue every 4 subcutaneous insulin  Anoxic brain injury, likely Dysphagia - Continue tube feeds  PTSD - Continue Seroquel, prazosin, trazodone  Transverse myelitis Chronic pain Neurogenic bladder due to hx of transverse myelitis -Continue opiates      Resolved issues: Septic shock secondary to community-acquired pneumonia: Status post 8 days azithromycin, 3 days of cephalosporin plus 5 days of IV meropenem followed by 7 days of Levaquin and doxycycline.   Resolved   Recurrent fever and tachycardia on 06/28/2020, new sepsis secondary to bilateral pneumonia: Treated and resolved No fever severe   Acute respiratory failure with hypoxia: Due to PNA.  Resolved.   S/p intubation and extubation.     AKI hyperphosphatemia Creatinine resolved Hemodialysis was initiated on 06/10/2020, now off and HD catheter removed. No plan for further hemodialysis.  He is not a good candidate for long-term dialysis per nephrologist.       Hypokalemia Resolved    Stage II coccygeal decubitus ulcer: Present on admission: Continue foam  dressing.  Frequent repositioning. Monitor closely. Pressure Injury 06/23/20 Coccyx Posterior;Mid Stage 2 -  Partial thickness loss of dermis presenting as a shallow open injury with a red, pink wound bed without slough. (Active)  06/23/20 1400  Location: Coccyx  Location Orientation: Posterior;Mid  Staging: Stage 2 -  Partial thickness loss of dermis presenting as a shallow open injury with a red, pink wound bed without slough.  Wound Description (Comments):   Present on Admission: Yes       DVT prophylaxis: heparin injection 5,000 Units Start: 06/10/20 2200   Diet:  Diet Orders (From admission, onward)     Start     Ordered   07/10/20 1328  Diet clear liquid Room service appropriate? Yes; Fluid consistency: Thin  Diet effective now       Question Answer Comment  Room service appropriate? Yes   Fluid consistency: Thin      07/10/20 1329              Code Status: DNR   Brief Narrative / Hospital Course to Date:   Medical records reviewed and are as summarized below:  Wyatt Ball. is a 60 y.o. male with medical history significant for hypertension, PTSD, diabetes, migraine, GERD, neurogenic bladder secondary to remote history of transverse myelitis requiring self urethral catheterization, frequent UTIs, chronic back pain on opioids, generally independent at baseline.  He usually gets his care at the [facility] hospital.  He was brought to the emergency room because of altered mental status, cough, shortness of breath, fever, vomiting and diarrhea.   He was admitted  to the hospital for sepsis secondary to pneumonia.  He was transferred to the ICU for septic shock, AKI, COPD exacerbation.  He was intubated and placed on mechanical ventilation.  He was treated with empiric IV antibiotics including IV fluconazole for yeast in sputum, IV fluids.  NG tube was placed for enteral nutrition and medications because of severe dysphagia.  Hemodialysis was discontinued because he is not a  candidate for long-term hemodialysis per nephrologist.   5/21 admitted for sepsis and pneumonia. 6/2 transferred to ICU, intubated underwent bronchoscopy 6/3 self extubated followed by reintubated 6/7 HD started 6/15 extubated 6/19 [facility]. Neurology, nephrology, urology and infectious disease consulted.     After transfer to progressive care unit, he developed fever and tachycardia that was concerning for sepsis on 06/28/2020.  He was restarted on IV antibiotics (meropenem and vancomycin).  Repeat blood culture did not show any growth.  Gastroenterologist was consulted for PEG tube placement per family's request.    Subjective 07/10/20    No new complaints.  No fever, respiratory distress, agitation, vomiting, seizures.  Disposition Plan & Communication   Status is: Inpatient  Remains inpatient appropriate because: The patient was previously independent, but is now completely dependent for all self-cares, due to his critical illness.  He will need continued advanced rehabilitation in an LTAC setting  Dispo: The patient is from: Home              Anticipated d/c is to: SNF vs LTAC              Patient currently is not medically stable to d/c.   Difficult to place patient No   Consults, Procedures, Significant Events   Consultants:  Gastroenterology Palliative care Nephrology  Procedures:  As above  Antimicrobials:  Anti-infectives (From admission, onward)    Start     Dose/Rate Route Frequency Ordered Stop   06/28/20 1815  vancomycin (VANCOREADY) IVPB 1750 mg/350 mL        1,750 mg 175 mL/hr over 120 Minutes Intravenous  Once 06/28/20 1726 06/28/20 2054   06/28/20 1815  meropenem (MERREM) 1 g in sodium chloride 0.9 % 100 mL IVPB  Status:  Discontinued        1 g 200 mL/hr over 30 Minutes Intravenous Every 12 hours 06/28/20 1726 07/01/20 1558   06/28/20 1742  vancomycin variable dose per unstable renal function (pharmacist dosing)  Status:  Discontinued          Does not apply See admin instructions 06/28/20 1742 06/30/20 0757   06/26/20 2100  fluconazole (DIFLUCAN) IVPB 50 mg       See Hyperspace for full Linked Orders Report.   50 mg 25 mL/hr over 60 Minutes Intravenous Every 24 hours 06/25/20 1835 06/30/20 2118   06/25/20 2000  fluconazole (DIFLUCAN) IVPB 200 mg       See Hyperspace for full Linked Orders Report.   200 mg 100 mL/hr over 60 Minutes Intravenous  Once 06/25/20 1835 06/25/20 2348   06/14/20 1800  levofloxacin (LEVAQUIN) IVPB 500 mg        500 mg 100 mL/hr over 60 Minutes Intravenous Every 48 hours 06/12/20 1624 06/19/20 0210   06/12/20 1800  levofloxacin (LEVAQUIN) IVPB 750 mg        750 mg 100 mL/hr over 90 Minutes Intravenous  Once 06/11/20 2239 06/12/20 1904   06/11/20 1800  meropenem (MERREM) 500 mg in sodium chloride 0.9 % 100 mL IVPB  Status:  Discontinued  500 mg 200 mL/hr over 30 Minutes Intravenous Every 24 hours 06/10/20 1444 06/11/20 2231   06/10/20 1300  doxycycline (VIBRAMYCIN) 100 mg in sodium chloride 0.9 % 250 mL IVPB  Status:  Discontinued        100 mg 125 mL/hr over 120 Minutes Intravenous Every 12 hours 06/10/20 1149 06/17/20 1704   06/09/20 1015  meropenem (MERREM) 500 mg in sodium chloride 0.9 % 100 mL IVPB        500 mg 200 mL/hr over 30 Minutes Intravenous Every 12 hours 06/09/20 0922 06/11/20 1203   06/08/20 2200  meropenem (MERREM) 1 g in sodium chloride 0.9 % 100 mL IVPB  Status:  Discontinued        1 g 200 mL/hr over 30 Minutes Intravenous Every 12 hours 06/08/20 0845 06/09/20 0922   06/07/20 2200  azithromycin (ZITHROMAX) 500 mg in sodium chloride 0.9 % 250 mL IVPB        500 mg 250 mL/hr over 60 Minutes Intravenous Every 24 hours 06/07/20 1441 06/11/20 2211   06/07/20 1600  ceFEPIme (MAXIPIME) 2 g in sodium chloride 0.9 % 100 mL IVPB  Status:  Discontinued        2 g 200 mL/hr over 30 Minutes Intravenous Every 8 hours 06/07/20 1441 06/07/20 1442   06/07/20 1600  meropenem (MERREM) 1 g  in sodium chloride 0.9 % 100 mL IVPB  Status:  Discontinued        1 g 200 mL/hr over 30 Minutes Intravenous Every 8 hours 06/07/20 1442 06/08/20 0845   06/06/20 1630  vancomycin (VANCOREADY) IVPB 1500 mg/300 mL  Status:  Discontinued        1,500 mg 150 mL/hr over 120 Minutes Intravenous  Once 06/06/20 1533 06/06/20 1632   06/05/20 0600  cefTRIAXone (ROCEPHIN) 2 g in sodium chloride 0.9 % 100 mL IVPB  Status:  Discontinued        2 g 200 mL/hr over 30 Minutes Intravenous Every 24 hours 06/04/20 2029 06/07/20 1439   06/04/20 2200  azithromycin (ZITHROMAX) 500 mg in sodium chloride 0.9 % 250 mL IVPB        500 mg 250 mL/hr over 60 Minutes Intravenous Every 24 hours 06/04/20 2029 06/07/20 0003   06/04/20 1915  ceFEPIme (MAXIPIME) 2 g in sodium chloride 0.9 % 100 mL IVPB        2 g 200 mL/hr over 30 Minutes Intravenous  Once 06/04/20 1906 06/04/20 2055   06/04/20 1915  metroNIDAZOLE (FLAGYL) IVPB 500 mg        500 mg 100 mL/hr over 60 Minutes Intravenous  Once 06/04/20 1906 06/04/20 2055   06/04/20 1915  vancomycin (VANCOCIN) IVPB 1000 mg/200 mL premix  Status:  Discontinued        1,000 mg 200 mL/hr over 60 Minutes Intravenous  Once 06/04/20 1906 06/04/20 1912   06/04/20 1915  vancomycin (VANCOREADY) IVPB 1750 mg/350 mL        1,750 mg 175 mL/hr over 120 Minutes Intravenous  Once 06/04/20 1912 06/04/20 2257         Micro    Objective   Vitals:   07/10/20 0409 07/10/20 0817 07/10/20 1205 07/10/20 1548  BP: (!) 160/78 (!) 150/80 123/76 (!) 152/75  Pulse: 100 94 77 87  Resp: 19 14 14 18   Temp: 97.8 F (36.6 C) 97.7 F (36.5 C) 97.7 F (36.5 C) (!) 97.4 F (36.3 C)  TempSrc: Oral Oral    SpO2: 98% 96% 96% 98%  Weight: 78.9 kg     Height:        Intake/Output Summary (Last 24 hours) at 07/10/2020 1824 Last data filed at 07/10/2020 0433 Gross per 24 hour  Intake --  Output 1900 ml  Net -1900 ml   Filed Weights   07/08/20 0500 07/09/20 0500 07/10/20 0409  Weight: 82.2 kg  81.1 kg 78.9 kg    Physical Exam:  General exam: Awake and alert, no acute distress, lying in bed, mumbles answers, chronically ill-appearing Respiratory system: Diminished bilaterally, no wheezes or rales, normal respiratory effort Cardiovascular system: Normal S1 and S2, regular rate and rhythm, no peripheral edema Gastrointestinal system: No tenderness palpation, G-tube in place, no distention or hepatosplenomegaly Neuro: Globally weak, not oriented, sluggish, does not consistently follow exams Extremities: no LE edema, offloading boots on feet, moves all   Labs   Data Reviewed: I have personally reviewed following labs and imaging studies  CBC: Recent Labs  Lab 07/08/20 0609  WBC 7.9  HGB 8.7*  HCT 25.6*  MCV 88.3  PLT 177   Basic Metabolic Panel: Recent Labs  Lab 07/05/20 0410 07/06/20 0530 07/07/20 0914 07/08/20 0609 07/09/20 0602 07/10/20 0929  NA 151* 145 141 140 140 144  K 3.9 3.5 3.8 3.3* 3.8 4.1  CL 116* 110 106 104 105 106  CO2 26 25 26 27 28 29   GLUCOSE 147* 148* 168* 162* 183* 137*  BUN 130* 124* 134* 119* 118* 109*  CREATININE 4.10* 3.79* 3.30* 3.30* 3.23* 3.10*  CALCIUM 10.0 9.6 10.2 10.0 10.8* 11.5*  MG 2.5*  --  2.3  --   --   --   PHOS 6.3*  --   --  4.2  --   --    GFR: Estimated Creatinine Clearance: 24.7 mL/min (A) (by C-G formula based on SCr of 3.1 mg/dL (H)). Liver Function Tests: No results for input(s): AST, ALT, ALKPHOS, BILITOT, PROT, ALBUMIN in the last 168 hours.  No results for input(s): LIPASE, AMYLASE in the last 168 hours. No results for input(s): AMMONIA in the last 168 hours. Coagulation Profile: No results for input(s): INR, PROTIME in the last 168 hours. Cardiac Enzymes: No results for input(s): CKTOTAL, CKMB, CKMBINDEX, TROPONINI in the last 168 hours. BNP (last 3 results) No results for input(s): PROBNP in the last 8760 hours. HbA1C: No results for input(s): HGBA1C in the last 72 hours. CBG: Recent Labs  Lab  07/10/20 0018 07/10/20 0511 07/10/20 0816 07/10/20 1158 07/10/20 1549  GLUCAP 144* 180* 136* 123* 153*   Lipid Profile: No results for input(s): CHOL, HDL, LDLCALC, TRIG, CHOLHDL, LDLDIRECT in the last 72 hours. Thyroid Function Tests: No results for input(s): TSH, T4TOTAL, FREET4, T3FREE, THYROIDAB in the last 72 hours. Anemia Panel: No results for input(s): VITAMINB12, FOLATE, FERRITIN, TIBC, IRON, RETICCTPCT in the last 72 hours. Sepsis Labs: No results for input(s): PROCALCITON, LATICACIDVEN in the last 168 hours.   No results found for this or any previous visit (from the past 240 hour(s)).     Imaging Studies   No results found.   Medications   Scheduled Meds:  amLODipine  5 mg Per Tube Daily   chlorhexidine gluconate (MEDLINE KIT)  15 mL Mouth Rinse BID   Chlorhexidine Gluconate Cloth  6 each Topical Daily   cloNIDine  0.3 mg Transdermal Weekly   feeding supplement (PROSource TF)  45 mL Per Tube Daily   free water  150 mL Per Tube Q4H   gabapentin  100 mg  Per Tube Q8H   heparin injection (subcutaneous)  5,000 Units Subcutaneous Q8H   insulin aspart  0-20 Units Subcutaneous Q4H   insulin aspart  3 Units Subcutaneous Q4H   insulin glargine  20 Units Subcutaneous Daily   metoprolol tartrate  25 mg Per Tube BID   multivitamin  1 tablet Per Tube QHS   nutrition supplement (JUVEN)  1 packet Per Tube BID   pantoprazole sodium  40 mg Per Tube Daily   prazosin  4 mg Per Tube QHS   QUEtiapine  12.5 mg Per Tube BID   sodium chloride flush  10-40 mL Intracatheter Q12H   traZODone  150 mg Per Tube QHS   Continuous Infusions:  sodium chloride Stopped (06/22/20 1614)   feeding supplement (NEPRO CARB STEADY) 1,000 mL (07/10/20 1515)       LOS: 36 days    Time spent: 15  minutes    Edwin Dada, MD [facility]  07/10/2020, 6:24 PM

## 2020-07-11 DIAGNOSIS — J189 Pneumonia, unspecified organism: Secondary | ICD-10-CM | POA: Diagnosis not present

## 2020-07-11 LAB — BASIC METABOLIC PANEL
Anion gap: 9 (ref 5–15)
BUN: 104 mg/dL — ABNORMAL HIGH (ref 8–23)
CO2: 30 mmol/L (ref 22–32)
Calcium: 10.9 mg/dL — ABNORMAL HIGH (ref 8.9–10.3)
Chloride: 98 mmol/L (ref 98–111)
Creatinine, Ser: 2.99 mg/dL — ABNORMAL HIGH (ref 0.61–1.24)
GFR, Estimated: 23 mL/min — ABNORMAL LOW (ref >60)
Glucose, Bld: 154 mg/dL — ABNORMAL HIGH (ref 70–99)
Potassium: 3.8 mmol/L (ref 3.5–5.1)
Sodium: 137 mmol/L (ref 135–145)

## 2020-07-11 LAB — GLUCOSE, CAPILLARY
Glucose-Capillary: 126 mg/dL — ABNORMAL HIGH (ref 70–99)
Glucose-Capillary: 132 mg/dL — ABNORMAL HIGH (ref 70–99)
Glucose-Capillary: 136 mg/dL — ABNORMAL HIGH (ref 70–99)
Glucose-Capillary: 146 mg/dL — ABNORMAL HIGH (ref 70–99)
Glucose-Capillary: 151 mg/dL — ABNORMAL HIGH (ref 70–99)
Glucose-Capillary: 165 mg/dL — ABNORMAL HIGH (ref 70–99)

## 2020-07-11 NOTE — TOC Progression Note (Signed)
Transition of Care (TOC) - Progression Note    Patient Details  Name: Wyatt Ball. MRN: [id number] Date of Birth: [birth year]  Transition of Care [facility]) CM/SW Contact  Caryn Section, RN Phone Number: 07/11/2020, 3:05 PM  Clinical Narrative:   [name] from [facility] contacted RNCM to advise that patient is  approved for SNF benefits.  Bed search to start when a response from patient's brother is received and determination of in network facility is established.  TOC contact information given, TOC to follow to discharge.    Expected Discharge Plan: Skilled Nursing Facility Barriers to Discharge: Continued Medical Work up  Expected Discharge Plan and Services Expected Discharge Plan: Skilled Nursing Facility In-house Referral: NA   Post Acute Care Choice: Skilled Nursing Facility Living arrangements for the past 2 months: Single Family Home                                       Social Determinants of Health (SDOH) Interventions    Readmission Risk Interventions No flowsheet data found.

## 2020-07-11 NOTE — Progress Notes (Signed)
  Speech Language Pathology Treatment: Dysphagia  Patient Details Name: Wyatt [place]. MRN: [id number] DOB: 1960-06-23 Today's Date: 07/11/2020 Time: 1000-1018 SLP Time Calculation (min) (ACUTE ONLY): 18 min  Assessment / Plan / Recommendation Clinical Impression  Pt seen for ongoing dysphagia management. Skilled observation was provided of pt consuming puree, graham crackers and thin liquids via straw. When consuming graham crackers, pt had an extended oral phase c/b ineffective mastication. At this time, pt is appropriate to liberate diet to include puree. Pt voiced his agreement with this diet - "I can deal with that"   HPI HPI: Clifton Rotter Montez Hageman. is a 60 y.o. 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 Pt intubated from 06/06/2020 thru 06/19/2020, PEG placed 07/03/2020.      SLP Plan  Continue with current plan of care       Recommendations  Diet recommendations: Dysphagia 1 (puree);Thin liquid Liquids provided via: Straw Medication Administration: Crushed with puree Supervision: Staff to assist with self feeding;Full supervision/cueing for compensatory strategies Compensations: Minimize environmental distractions;Slow rate;Small sips/bites Postural Changes and/or Swallow Maneuvers: Seated upright 90 degrees;Upright 30-60 min after meal                Oral Care Recommendations: Oral care BID Follow up Recommendations: Skilled Nursing facility SLP Visit Diagnosis: Dysphagia, oral phase (R13.11);Cognitive communication deficit (R41.841) Plan: Continue with current plan of care       GO               Gailya Tauer  B. Dreama Saa M.S., CCC-SLP, [facility] Speech-Language Pathologist Rehabilitation Services Office [phone]  [name] 07/11/2020, 12:37 PM

## 2020-07-11 NOTE — Progress Notes (Signed)
PROGRESS NOTE    Wyatt [place].  NID:[id number] DOB: 1960-10-30 DOA: 06/04/2020 PCP: System, Provider Not In   Chief Complaint  Patient presents with   Altered Mental Status     Brief Narrative: 60 year old male from the [facility] with PMH of HTN, DM2, Transverse Myelitis complicated by Neurogenic Bladder with intermittent catherization and Recurrent UTIs, Chronic COPD, and Chronic Pain Syndrome with Opiate Use who was admitted on 5/31 with altered mental status and fevers up to 101.9 and cough x 3 days found to have septic shock due to multifocal pneumonia requiring pressors and complicated by acute renal failure requiring dialysis.  CT Scan shows bilateral nodular opacities Left>Right.  COVID and Flu was negative.  UA with 80 ketones and rare bacteria.  Ucx NG.  Scx NG.  Blood Cultures grew 1/4 MSSE.  UDS positive for opiates and TCAs.  He is on multiple pain meds at home.  6/2 Intubated and Bronchoscopy with negative cultures. 6/3 Self Extubated and Re-intubated.   MRI of brain showed no acute abnormality. 6/7 HD started 6/15 Extubated 6/19 Transferred to [facility] 6/24 Recurrent Fevers, treated with short course of antibiotics. 6/27 HD stopped.  Goals of care discussed with palliative team 6/29 EGD and G Tube placed for persistent dysphagia.  Subjective: No fevers. BP is stable. Respiratory status is 99% on room air. Neurological status is stable.   Assessment & Plan: Principal Problem:   CAP (community acquired pneumonia) Active Problems:   Acute metabolic encephalopathy   Severe sepsis with septic shock (HCC)   Neurogenic bladder   Chronic, continuous use of opioids   Chronic low back pain   Type 2 diabetes mellitus without complication (HCC)   Migraines   Recurrent UTI   History of colon polyps   Urinary retention   Altered mental status   Pressure injury of skin   AKI (acute kidney injury) ([place])   Malnutrition of moderate degree  Multifocal Pneumonia,  Left> Right: Treated with roughly 14 day course of antibiotics per ID recommendations: - Monitor.  Acute Kidney Injury on CKD: - Creatinine decreased from 3.2 to 2.9 mg/dL - this is his new baseline. - Avoid nephrotoxic meds and monitor renal function. - Family does not want to pursue further dialysis session.  Hypertension: - Amlodipine 5 mg daily and Clonidine patch weekly, and Lopressor 25 mg BID.  Diabetes Mellitus Type 2: - Glucose is stable.   - Continue Glargine 20 units nightly, Novolog 3 units q4hrs, and SSI  Chronic Pain:  - Gabapentin 100 mg TID.  Nausea:  - Zofran PRN.  Neurogenic Bladder: - Prazosin 4 mg HS and Foley.  Agitation / Insomnia: - Seroquel 12.5 mg BID and Trazodone 150 mg HS.  Physical Deconditioning: - PT/OT recommends SNF.  Code Status:  - DNR/DNI  Diet Order             DIET - DYS 1 Room service appropriate? Yes; Fluid consistency: Thin  Diet effective now                   Nutrition Problem: Moderate Malnutrition (in the context of chronic illness) Etiology: inability to eat Signs/Symptoms: mild fat depletion, mild muscle depletion, moderate muscle depletion Interventions: Tube feeding, Juven Patient's Body mass index is 27.95 kg/m.   Pressure Injury 06/23/20 Coccyx Posterior;Mid Stage 2 -  Partial thickness loss of dermis presenting as a shallow open injury with a red, pink wound bed without slough. (Active)  06/23/20 1400  Location:  Coccyx  Location Orientation: Posterior;Mid  Staging: Stage 2 -  Partial thickness loss of dermis presenting as a shallow open injury with a red, pink wound bed without slough.  Wound Description (Comments):   Present on Admission: Yes    DVT prophylaxis: heparin injection 5,000 Units Start: 06/10/20 2200 Code Status:   Code Status: DNR  Family Communication: plan of care discussed with patient at bedside.  Status is: Inpatient  Remains inpatient appropriate because:Inpatient level of care  appropriate due to severity of illness  Dispo: The patient is from: Home              Anticipated d/c is to: SNF Discharge is pending SNF placement.              Patient currently is not medically stable to d/c.   Difficult to place patient No    Unresulted Labs (From admission, onward)     Start     Ordered   06/06/20 1819  Pneumocystis smear by DFA  ONCE - STAT,   STAT       Question:  Patient immune status  Answer:  Immunocompromised   06/06/20 1820   06/06/20 1818  Acid Fast Culture with reflexed sensitivities  (AFB smear + Culture w reflexed sensitivities panel)  Once,   R       Question:  Patient immune status  Answer:  Immunocompromised  See Hyperspace for full Linked Orders Report.   06/06/20 1820   Signed and Held  Phosphorus  Once,   R       Question:  Specimen collection method  Answer:  Unit=Unit collect   Signed and Held   Signed and Held  Phosphorus  Once,   R       Question:  Specimen collection method  Answer:  Unit=Unit collect   Signed and Held   Signed and Held  Phosphorus  Once,   R       Question:  Specimen collection method  Answer:  Unit=Unit collect   Signed and Held             Medications reviewed:  Scheduled Meds:  amLODipine  5 mg Per Tube Daily   chlorhexidine gluconate (MEDLINE KIT)  15 mL Mouth Rinse BID   Chlorhexidine Gluconate Cloth  6 each Topical Daily   cloNIDine  0.3 mg Transdermal Weekly   feeding supplement (PROSource TF)  45 mL Per Tube Daily   free water  150 mL Per Tube Q4H   gabapentin  100 mg Per Tube Q8H   heparin injection (subcutaneous)  5,000 Units Subcutaneous Q8H   insulin aspart  0-20 Units Subcutaneous Q4H   insulin aspart  3 Units Subcutaneous Q4H   insulin glargine  20 Units Subcutaneous Daily   metoprolol tartrate  25 mg Per Tube BID   multivitamin  1 tablet Per Tube QHS   nutrition supplement (JUVEN)  1 packet Per Tube BID   pantoprazole sodium  40 mg Per Tube Daily   prazosin  4 mg Per Tube QHS   QUEtiapine   12.5 mg Per Tube BID   sodium chloride flush  10-40 mL Intracatheter Q12H   traZODone  150 mg Per Tube QHS   Continuous Infusions:  sodium chloride Stopped (06/22/20 1614)   feeding supplement (NEPRO CARB STEADY) 1,000 mL (07/10/20 1515)    Consultants:see note  Procedures:see note  Antimicrobials: Anti-infectives (From admission, onward)    Start     Dose/Rate Route Frequency Ordered Stop  06/28/20 1815  vancomycin (VANCOREADY) IVPB 1750 mg/350 mL        1,750 mg 175 mL/hr over 120 Minutes Intravenous  Once 06/28/20 1726 06/28/20 2054   06/28/20 1815  meropenem (MERREM) 1 g in sodium chloride 0.9 % 100 mL IVPB  Status:  Discontinued        1 g 200 mL/hr over 30 Minutes Intravenous Every 12 hours 06/28/20 1726 07/01/20 1558   06/28/20 1742  vancomycin variable dose per unstable renal function (pharmacist dosing)  Status:  Discontinued         Does not apply See admin instructions 06/28/20 1742 06/30/20 0757   06/26/20 2100  fluconazole (DIFLUCAN) IVPB 50 mg       See Hyperspace for full Linked Orders Report.   50 mg 25 mL/hr over 60 Minutes Intravenous Every 24 hours 06/25/20 1835 06/30/20 2118   06/25/20 2000  fluconazole (DIFLUCAN) IVPB 200 mg       See Hyperspace for full Linked Orders Report.   200 mg 100 mL/hr over 60 Minutes Intravenous  Once 06/25/20 1835 06/25/20 2348   06/14/20 1800  levofloxacin (LEVAQUIN) IVPB 500 mg        500 mg 100 mL/hr over 60 Minutes Intravenous Every 48 hours 06/12/20 1624 06/19/20 0210   06/12/20 1800  levofloxacin (LEVAQUIN) IVPB 750 mg        750 mg 100 mL/hr over 90 Minutes Intravenous  Once 06/11/20 2239 06/12/20 1904   06/11/20 1800  meropenem (MERREM) 500 mg in sodium chloride 0.9 % 100 mL IVPB  Status:  Discontinued        500 mg 200 mL/hr over 30 Minutes Intravenous Every 24 hours 06/10/20 1444 06/11/20 2231   06/10/20 1300  doxycycline (VIBRAMYCIN) 100 mg in sodium chloride 0.9 % 250 mL IVPB  Status:  Discontinued        100  mg 125 mL/hr over 120 Minutes Intravenous Every 12 hours 06/10/20 1149 06/17/20 1704   06/09/20 1015  meropenem (MERREM) 500 mg in sodium chloride 0.9 % 100 mL IVPB        500 mg 200 mL/hr over 30 Minutes Intravenous Every 12 hours 06/09/20 0922 06/11/20 1203   06/08/20 2200  meropenem (MERREM) 1 g in sodium chloride 0.9 % 100 mL IVPB  Status:  Discontinued        1 g 200 mL/hr over 30 Minutes Intravenous Every 12 hours 06/08/20 0845 06/09/20 0922   06/07/20 2200  azithromycin (ZITHROMAX) 500 mg in sodium chloride 0.9 % 250 mL IVPB        500 mg 250 mL/hr over 60 Minutes Intravenous Every 24 hours 06/07/20 1441 06/11/20 2211   06/07/20 1600  ceFEPIme (MAXIPIME) 2 g in sodium chloride 0.9 % 100 mL IVPB  Status:  Discontinued        2 g 200 mL/hr over 30 Minutes Intravenous Every 8 hours 06/07/20 1441 06/07/20 1442   06/07/20 1600  meropenem (MERREM) 1 g in sodium chloride 0.9 % 100 mL IVPB  Status:  Discontinued        1 g 200 mL/hr over 30 Minutes Intravenous Every 8 hours 06/07/20 1442 06/08/20 0845   06/06/20 1630  vancomycin (VANCOREADY) IVPB 1500 mg/300 mL  Status:  Discontinued        1,500 mg 150 mL/hr over 120 Minutes Intravenous  Once 06/06/20 1533 06/06/20 1632   06/05/20 0600  cefTRIAXone (ROCEPHIN) 2 g in sodium chloride 0.9 % 100 mL IVPB  Status:  Discontinued        2 g 200 mL/hr over 30 Minutes Intravenous Every 24 hours 06/04/20 2029 06/07/20 1439   06/04/20 2200  azithromycin (ZITHROMAX) 500 mg in sodium chloride 0.9 % 250 mL IVPB        500 mg 250 mL/hr over 60 Minutes Intravenous Every 24 hours 06/04/20 2029 06/07/20 0003   06/04/20 1915  ceFEPIme (MAXIPIME) 2 g in sodium chloride 0.9 % 100 mL IVPB        2 g 200 mL/hr over 30 Minutes Intravenous  Once 06/04/20 1906 06/04/20 2055   06/04/20 1915  metroNIDAZOLE (FLAGYL) IVPB 500 mg        500 mg 100 mL/hr over 60 Minutes Intravenous  Once 06/04/20 1906 06/04/20 2055   06/04/20 1915  vancomycin (VANCOCIN) IVPB 1000  mg/200 mL premix  Status:  Discontinued        1,000 mg 200 mL/hr over 60 Minutes Intravenous  Once 06/04/20 1906 06/04/20 1912   06/04/20 1915  vancomycin (VANCOREADY) IVPB 1750 mg/350 mL        1,750 mg 175 mL/hr over 120 Minutes Intravenous  Once 06/04/20 1912 06/04/20 2257      Culture/Microbiology    Component Value Date/Time   SDES BLOOD RIGHT HAND 06/28/2020 1705   SDES BLOOD LEFT ANTECUBITAL 06/28/2020 1705   SPECREQUEST  06/28/2020 1705    BOTTLES DRAWN AEROBIC AND ANAEROBIC Blood Culture results may not be optimal due to an excessive volume of blood received in culture bottles   SPECREQUEST  06/28/2020 1705    BOTTLES DRAWN AEROBIC AND ANAEROBIC Blood Culture adequate volume   CULT  06/28/2020 1705    NO GROWTH 5 DAYS Performed at [facility], [address] [place], [place] [address]    CULT  06/28/2020 1705    NO GROWTH 5 DAYS Performed at [facility], [address] [place], [place] [address]    REPTSTATUS 07/03/2020 FINAL 06/28/2020 1705   REPTSTATUS 07/03/2020 FINAL 06/28/2020 1705    Other culture-see note  Objective: Vitals: Today's Vitals   07/10/20 2014 07/11/20 0300 07/11/20 0806 07/11/20 1220  BP: (!) 161/73 (!) 157/81 (!) 140/127 (!) 146/105  Pulse: 90 88 95 (!) 109  Resp: 16 18 18 20   Temp: 97.9 F (36.6 C) (!) 97.4 F (36.3 C) (!) 97.5 F (36.4 C)   TempSrc: Oral Oral Oral   SpO2: 100% 99% 94%   Weight:  78.5 kg    Height:      PainSc:        Intake/Output Summary (Last 24 hours) at 07/11/2020 1437 Last data filed at 07/11/2020 0300 Gross per 24 hour  Intake 30 ml  Output 2500 ml  Net -2470 ml   Filed Weights   07/09/20 0500 07/10/20 0409 07/11/20 0300  Weight: 81.1 kg 78.9 kg 78.5 kg   Weight change: -0.4 kg  Intake/Output from previous day: 07/06 0701 - 07/07 0700 In: 30 [I.V.:30] Out: 2500 [Urine:2500] Intake/Output this shift: No intake/output data recorded. Filed Weights   07/09/20 0500 07/10/20 0409  07/11/20 0300  Weight: 81.1 kg 78.9 kg 78.5 kg    Examination: General exam: AA, chronically ill appearing, NAD HEENT:PERRL, EOMI Respiratory system: bilaterally diminished, no use of accessory muscle, non tender. Cardiovascular system: S1 & S2 +, No murmur, No JVD. Gastrointestinal system: Abdomen soft, NT,ND, G Tube in place. Nervous System: No acute focal deficits Extremities: No edema, distal peripheral pulses palpable, offloading boots in place. GU: phallus  with subcoronal hypospadias and patulous meatus.  14 Fr Foley in place. Skin: No rashes,no icterus. MSK: physical deconditioning.  Data Reviewed: I have personally reviewed following labs and imaging studies CBC: Recent Labs  Lab 07/08/20 0609  WBC 7.9  HGB 8.7*  HCT 25.6*  MCV 88.3  PLT 121   Basic Metabolic Panel: Recent Labs  Lab 07/05/20 0410 07/06/20 0530 07/07/20 0914 07/08/20 0609 07/09/20 0602 07/10/20 0929 07/11/20 0851  NA 151*   < > 141 140 140 144 137  K 3.9   < > 3.8 3.3* 3.8 4.1 3.8  CL 116*   < > 106 104 105 106 98  CO2 26   < > 26 27 28 29 30   GLUCOSE 147*   < > 168* 162* 183* 137* 154*  BUN 130*   < > 134* 119* 118* 109* 104*  CREATININE 4.10*   < > 3.30* 3.30* 3.23* 3.10* 2.99*  CALCIUM 10.0   < > 10.2 10.0 10.8* 11.5* 10.9*  MG 2.5*  --  2.3  --   --   --   --   PHOS 6.3*  --   --  4.2  --   --   --    < > = values in this interval not displayed.    CBG: Recent Labs  Lab 07/10/20 1941 07/11/20 0051 07/11/20 0354 07/11/20 0805 07/11/20 1223  GLUCAP 164* 132* 126* 151* 146*     Radiology Studies: No results found.   LOS: 39 days   George Hugh, MD [facility]  07/11/2020, 2:37 PM

## 2020-07-11 NOTE — Progress Notes (Signed)
   07/11/20 0959  PT Visit Information  Last PT Received On 07/10/20  Reason Eval/Treat Not Completed Fatigue/lethargy limiting ability to participate;Patient's level of consciousness;Patient declined, no reason specified (Chart reviewed, treatment session attempted. Pt asleep upon entry, does awaken to voice but remains quite somnolent. Pt declines therapy at this time, asks to try later when he is more alert. WIll attempt again at later date/time.)   10:02 AM, 07/11/20 Rosamaria Lints, PT, DPT Physical Therapist - [facility]  [phone] [facility])

## 2020-07-11 NOTE — Progress Notes (Signed)
Occupational Therapy Treatment Patient Details Name: Wyatt [place]. MRN: [id number] DOB: Nov 11, 1960 Today's Date: 07/11/2020    History of present illness Wyatt [place]. is a 60 y.o. 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  Since hospitalization pt has been intubated, finally extubated 6/15.   OT comments  [name] was seen for OT treatment on this date, overlapping with PT for safe mobility. Upon arrival PT in room with pt engaging in conversation. Pt continues to require TOTAL A don/doff B prevalon boots at bed level. TOTAL A for L+R rolling and pericare at bed level. Pt maintained sidelying position with MIN A. Poor command following however improved from prior evaluation. Pt able to follow ~1/4 of commands and demonstrates protective righting reflexes. Anticipate pt will improve participation with therapy with increased arousal. Pt making progress toward goals. Pt continues to benefit from skilled OT services to maximize return to PLOF and minimize risk of future falls, injury, caregiver burden, and readmission. Will continue to follow POC. Discharge recommendation remains appropriate.    Follow Up Recommendations  SNF    Equipment Recommendations  Other (comment) (TBD)    Recommendations for Other Services      Precautions / Restrictions Precautions Precautions: Fall Restrictions Weight Bearing Restrictions: No       Mobility Bed Mobility Overal bed mobility: Needs Assistance Bed Mobility: Supine to Sit;Sit to Supine;Rolling Rolling: Total assist;+2 for physical assistance   Supine to sit: Total assist;+2 for physical  assistance Sit to supine: Total assist;+2 for physical assistance   General bed mobility comments: poor command following    Transfers                 General transfer comment: unsafe to attempt due to very poor sitting balance    Balance Overall balance assessment: Needs assistance Sitting-balance support: Feet unsupported;No upper extremity supported Sitting balance-Leahy Scale: Zero Sitting balance - Comments: max assist at edge of bed, falls in all directions when assistance removed. flexed neck/head in sitting and is unable to extend independently Postural control: Right lateral lean;Left lateral lean;Posterior lean     Standing balance comment: unable to attempt                           ADL either performed or assessed with clinical judgement   ADL Overall ADL's : Needs assistance/impaired                                       General ADL Comments: Pt continues to require MAX-TOTAL A for LB ADL management including bed level toileting and LB bathing.      Cognition Arousal/Alertness: Lethargic Behavior During Therapy: Flat affect Overall Cognitive Status: No family/caregiver present to determine baseline cognitive functioning                                 General Comments: Patient more alert, able to tell me his name and reports pain with movement. He does not answer when asked  where he is.        Exercises Exercises: Other exercises Other Exercises Other Exercises: Pt educated re: OT role, falls prevention Other Exercises: toileting, LBD, sup<>sit, rolling, sitting blaance/tolerance    Pertinent Vitals/ Pain       Pain Assessment: Faces Faces Pain Scale: Hurts even more Pain Location: back Pain Descriptors / Indicators: Aching Pain Intervention(s): Monitored during session;Repositioned         Frequency  Min 1X/week        Progress Toward Goals  OT Goals(current goals can now be found in the  care plan section)  Progress towards OT goals: Progressing toward goals  Acute Rehab OT Goals Patient Stated Goal: To drink a diet coke. OT Goal Formulation: With patient Time For Goal Achievement: 07/19/20 Potential to Achieve Goals: Poor ADL Goals Pt Will Perform Grooming: with mod assist;bed level Pt Will Transfer to Toilet: with mod assist;with +2 assist Additional ADL Goal #1: Pt will follow ~50% of one step commands with MIN cues and increased time to process. Additional ADL Goal #2: Pt will track therapist visuall in the room ~50% of session. Additional ADL Goal #3: Pt will demo some level of attempted participation in bed mobility (at least MAX A +2 with pt contributing on some level such as attempting to reach and hold railing or propulsion off of a lower extremity) to allow for further development of OT POC as it pertains to mobility.  Plan Frequency remains appropriate;Discharge plan needs to be updated       AM-PAC OT "6 Clicks" Daily Activity     Outcome Measure   Help from another person eating meals?: Total Help from another person taking care of personal grooming?: A Lot Help from another person toileting, which includes using toliet, bedpan, or urinal?: Total Help from another person bathing (including washing, rinsing, drying)?: Total Help from another person to put on and taking off regular upper body clothing?: Total Help from another person to put on and taking off regular lower body clothing?: Total 6 Click Score: 7    End of Session    OT Visit Diagnosis: Other symptoms and signs involving cognitive function   Activity Tolerance Patient tolerated treatment well   Patient Left in bed;with call bell/phone within reach;with bed alarm set   Nurse Communication Mobility status        Time: 6812-7517 OT Time Calculation (min): 20 min  Charges: OT General Charges $OT Visit: 1 Visit OT Treatments $Self Care/Home Management : 8-22 mins  Kathie Dike,  M.S. OTR/L  07/11/20, 12:55 PM  ascom [phone]

## 2020-07-11 NOTE — Progress Notes (Signed)
Physical Therapy Treatment Patient Details Name: Wyatt Ball. MRN: [id number] DOB: [birth year] Today's Date: 07/11/2020    History of Present Illness Wyatt Ball Hageman. is a 60 y.o. 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  Since hospitalization pt has been intubated, finally extubated 6/15.    PT Comments    Patient is received in bed with gown off. Required total assist to don gown prior to session.  He is able to tell me his name is "Wyatt Ball", does not answer when asked where he is. Patient lethargic, but eyes open initially when speaking to him. Requires total +2 assist for all bed mobility. Once seated at edge of bed he is unable to maintain sitting balance without max assist. Poor posture in sitting, unable to correct despite cues. Patient is moaning and complaining of pain throughout session. Patient  will continue to benefit from skilled PT while here to determine if progress can be made to improve strength and function.     Follow Up Recommendations  SNF;Other (comment) (Long term care)     Equipment Recommendations       Recommendations for Other Services       Precautions / Restrictions Precautions Precautions: Fall Restrictions Weight Bearing Restrictions: No    Mobility  Bed Mobility Overal bed mobility: Needs Assistance Bed Mobility: Supine to Sit;Sit to Supine;Rolling Rolling: Total assist;+2 for physical assistance   Supine to sit: Total assist;+2 for physical assistance Sit to supine: Total assist;+2 for physical assistance   General bed mobility comments: Patient does not assist with mobility    Transfers                  General transfer comment: unsafe to attempt due to very poor sitting balance  Ambulation/Gait             General Gait Details: unable   Stairs             Wheelchair Mobility    Modified Rankin (Stroke Patients Only)       Balance Overall balance assessment: Needs assistance Sitting-balance support: Feet unsupported;No upper extremity supported Sitting balance-Leahy Scale: Zero Sitting balance - Comments: max assist at edge of bed, falls in all directions when assistance removed. flexed neck/head in sitting and is unable to extend independently Postural control: Right lateral lean;Left lateral lean;Posterior lean     Standing balance comment: unable to attempt                            Cognition Arousal/Alertness: Lethargic Behavior During Therapy: Flat affect Overall Cognitive Status: No family/caregiver present to determine baseline cognitive functioning                                 General Comments: Patient more alert, able to tell me his name and reports pain with movement. He does not answer when asked where he is.      Exercises      General Comments        Pertinent Vitals/Pain Pain Assessment: Faces Faces Pain Scale: Hurts even more Pain  Location: all over with movement in bed, sitting up Pain Descriptors / Indicators: Grimacing;Moaning Pain Intervention(s): Limited activity within patient's tolerance;Monitored during session;Repositioned    Home Living                      Prior Function            PT Goals (current goals can now be found in the care plan section) Acute Rehab PT Goals PT Goal Formulation: Patient unable to participate in goal setting Time For Goal Achievement: 07/25/20 Progress towards PT goals: Not progressing toward goals - comment (continues to require total assist/total care)    Frequency    Min 2X/week      PT Plan Current plan remains appropriate     Co-evaluation              AM-PAC PT "6 Clicks" Mobility   Outcome Measure  Help needed turning from your back to your side while in a flat bed without using bedrails?: Total Help needed moving from lying on your back to sitting on the side of a flat bed without using bedrails?: Total Help needed moving to and from a bed to a chair (including a wheelchair)?: Total Help needed standing up from a chair using your arms (e.g., wheelchair or bedside chair)?: Total Help needed to walk in hospital room?: Total Help needed climbing 3-5 steps with a railing? : Total 6 Click Score: 6    End of Session   Activity Tolerance: Patient limited by fatigue;Patient limited by pain Patient left: in bed;with bed alarm set Nurse Communication: Mobility status PT Visit Diagnosis: Muscle weakness (generalized) (M62.81);Adult, failure to thrive (R62.7);Pain Pain - part of body:  (all over)     Time: 5366-4403 PT Time Calculation (min) (ACUTE ONLY): 17 min  Charges:  $Therapeutic Activity: 8-22 mins                    Lossie Kalp, PT, GCS 07/11/20,11:52 AM

## 2020-07-12 DIAGNOSIS — J189 Pneumonia, unspecified organism: Secondary | ICD-10-CM | POA: Diagnosis not present

## 2020-07-12 LAB — CBC
HCT: 25.6 % — ABNORMAL LOW (ref 39.0–52.0)
Hemoglobin: 8.4 g/dL — ABNORMAL LOW (ref 13.0–17.0)
MCH: 29.5 pg (ref 26.0–34.0)
MCHC: 32.8 g/dL (ref 30.0–36.0)
MCV: 89.8 fL (ref 80.0–100.0)
Platelets: 326 10*3/uL (ref 150–400)
RBC: 2.85 MIL/uL — ABNORMAL LOW (ref 4.22–5.81)
RDW: 13.5 % (ref 11.5–15.5)
WBC: 10 10*3/uL (ref 4.0–10.5)
nRBC: 0 % (ref 0.0–0.2)

## 2020-07-12 LAB — BASIC METABOLIC PANEL
Anion gap: 7 (ref 5–15)
BUN: 108 mg/dL — ABNORMAL HIGH (ref 8–23)
CO2: 29 mmol/L (ref 22–32)
Calcium: 10.7 mg/dL — ABNORMAL HIGH (ref 8.9–10.3)
Chloride: 100 mmol/L (ref 98–111)
Creatinine, Ser: 2.79 mg/dL — ABNORMAL HIGH (ref 0.61–1.24)
GFR, Estimated: 25 mL/min — ABNORMAL LOW (ref >60)
Glucose, Bld: 136 mg/dL — ABNORMAL HIGH (ref 70–99)
Potassium: 3.3 mmol/L — ABNORMAL LOW (ref 3.5–5.1)
Sodium: 136 mmol/L (ref 135–145)

## 2020-07-12 LAB — GLUCOSE, CAPILLARY
Glucose-Capillary: 111 mg/dL — ABNORMAL HIGH (ref 70–99)
Glucose-Capillary: 136 mg/dL — ABNORMAL HIGH (ref 70–99)
Glucose-Capillary: 137 mg/dL — ABNORMAL HIGH (ref 70–99)
Glucose-Capillary: 153 mg/dL — ABNORMAL HIGH (ref 70–99)
Glucose-Capillary: 155 mg/dL — ABNORMAL HIGH (ref 70–99)
Glucose-Capillary: 157 mg/dL — ABNORMAL HIGH (ref 70–99)

## 2020-07-12 MED ORDER — ALTEPLASE 2 MG IJ SOLR
4.0000 mg | Freq: Once | INTRAMUSCULAR | Status: AC
  Administered 2020-07-12: 16:00:00 4 mg
  Filled 2020-07-12: qty 4

## 2020-07-12 MED ORDER — METOPROLOL TARTRATE 50 MG PO TABS
50.0000 mg | ORAL_TABLET | Freq: Two times a day (BID) | ORAL | Status: DC
  Administered 2020-07-12 – 2020-07-13 (×2): 50 mg
  Filled 2020-07-12 (×2): qty 1

## 2020-07-12 MED ORDER — POTASSIUM CHLORIDE CRYS ER 20 MEQ PO TBCR
40.0000 meq | EXTENDED_RELEASE_TABLET | Freq: Once | ORAL | Status: AC
  Administered 2020-07-12: 09:00:00 40 meq via ORAL
  Filled 2020-07-12: qty 2

## 2020-07-12 NOTE — TOC Progression Note (Addendum)
Transition of Care (TOC) - Progression Note    Patient Details  Name: Wyatt Ball. MRN: [id number] Date of Birth: Apr 18, 1960  Transition of Care [facility]) CM/SW Contact  Caryn Section, RN Phone Number: 07/12/2020, 4:16 PM  Clinical Narrative:   Discussed placement with patient's brother, [name].  [name] states he prefers [place] to place patient.  As per [name], she will email information on [facility] [employer], as brother prefers a facility with other veterans in [place], [place].  [name] instructed RNCM to [facility] transfer: Wonda Cerise [phone] ext [phone], RNCM left message for Ms. Reuben Likes to return call for admission.  Note:  patient's brother has been ill and has not been available to discuss until today.  Expected Discharge Plan: Skilled Nursing Facility Barriers to Discharge: Continued Medical Work up  Expected Discharge Plan and Services Expected Discharge Plan: Skilled Nursing Facility In-house Referral: NA   Post Acute Care Choice: Skilled Nursing Facility Living arrangements for the past 2 months: Single Family Home                                       Social Determinants of Health (SDOH) Interventions    Readmission Risk Interventions No flowsheet data found.

## 2020-07-12 NOTE — NC FL2 (Signed)
[place] LEVEL OF CARE SCREENING TOOL     IDENTIFICATION  Patient Name: Wyatt Ball. Birthdate: 1960-10-17 Sex: male Admission Date (Current Location): 06/04/2020  [facility] and [place] Number:  Engineering geologist and Address:  [facility], [address] [place], [place] [address]      Provider Number: [id number]  Attending Physician Name and Address:  George Hugh, MD  Relative Name and Phone Number:       Current Level of Care: Hospital Recommended Level of Care: [place] Prior Approval Number:    Date Approved/Denied:   PASRR Number: [id number]  Discharge Plan: SNF    Current Diagnoses: Patient Active Problem List   Diagnosis Date Noted   Malnutrition of moderate degree 07/10/2020   AKI (acute kidney injury) ([place]) 06/26/2020   Pressure injury of skin 06/25/2020   Altered mental status    Urinary retention    Acute metabolic encephalopathy 29/51/8841   Severe sepsis with septic shock ([place]) 06/04/2020   CAP (community acquired pneumonia) 06/04/2020   Neurogenic bladder 06/04/2020   Chronic, continuous use of opioids 06/04/2020   Chronic low back pain 06/04/2020   Type 2 diabetes mellitus without complication ([place]) 66/06/3014   Migraines 06/04/2020   Recurrent UTI 06/04/2020   History of colon polyps 06/04/2020    Orientation RESPIRATION BLADDER Height & Weight     Self  Normal Incontinent, Indwelling catheter Weight: 80.3 kg Height:  5' 5.98" (167.6 cm)  BEHAVIORAL SYMPTOMS/MOOD NEUROLOGICAL BOWEL NUTRITION STATUS      Incontinent Diet (Dysphagia 1 nutritional supplements, feeding tube)  AMBULATORY STATUS COMMUNICATION OF NEEDS Skin   Extensive Assist Verbally Other (Comment) (ecchymosis l hand, arm abdomen)                       Personal Care Assistance Level of Assistance  Bathing, Feeding, Dressing Bathing Assistance: Maximum assistance Feeding assistance: Maximum assistance  (Patient has feeding tube) Dressing Assistance: Maximum assistance     Functional Limitations Info  Sight, Hearing, Speech Sight Info: Adequate Hearing Info: Impaired Speech Info: Impaired    SPECIAL CARE FACTORS FREQUENCY  PT (By licensed PT), OT (By licensed OT)     PT Frequency: 5x weekly OT Frequency: 5x weekly            Contractures Contractures Info: Not present    Additional Factors Info  Code Status, Allergies Code Status Info: DNR Allergies Info: No Know [facility]           Current Medications (07/12/2020):  This is the current hospital active medication list Current Facility-Administered Medications  Medication Dose Route Frequency Provider Last Rate Last Admin   0.9 %  sodium chloride infusion   Intravenous PRN Jennye Boroughs, MD   Stopped at 06/22/20 1614   amLODipine (NORVASC) tablet 5 mg  5 mg Per Tube Daily Jennye Boroughs, MD   5 mg at 07/12/20 0109   chlorhexidine gluconate (MEDLINE KIT) (PERIDEX) 0.12 % solution 15 mL  15 mL Mouth Rinse BID Jennye Boroughs, MD   15 mL at 07/12/20 0730   Chlorhexidine Gluconate Cloth 2 % PADS 6 each  6 each Topical Daily Jennye Boroughs, MD   6 each at 07/12/20 3235   cloNIDine (CATAPRES - Dosed in mg/24 hr) patch 0.3 mg  0.3 mg Transdermal Weekly Jennye Boroughs, MD   0.3 mg at 07/05/20 1302   feeding supplement (NEPRO CARB STEADY) liquid 1,000 mL  1,000 mL Per Tube  Continuous Edwin Dada, MD 55 mL/hr at 07/10/20 1515 1,000 mL at 07/10/20 1515   feeding supplement (PROSource TF) liquid 45 mL  45 mL Per Tube Daily Edwin Dada, MD   45 mL at 07/12/20 0928   free water 150 mL  150 mL Per Tube Q4H Nicole Kindred A, DO   150 mL at 07/12/20 0929   gabapentin (NEURONTIN) 250 MG/5ML solution 100 mg  100 mg Per Tube Allie Dimmer, MD   100 mg at 07/12/20 0437   haloperidol lactate (HALDOL) injection 2 mg  2 mg Intravenous Q6H PRN Jennye Boroughs, MD   2 mg at 07/11/20 1227   heparin injection 5,000 Units   5,000 Units Subcutaneous Q8H Jennye Boroughs, MD   5,000 Units at 07/12/20 0437   heparin sodium (porcine) injection 1,300 Units  1,300 Units Intravenous PRN Jennye Boroughs, MD   1,300 Units at 06/21/20 1315   heparin sodium (porcine) injection 1,300 Units  1,300 Units Intravenous PRN Jennye Boroughs, MD   1,300 Units at 06/21/20 1315   hydrALAZINE (APRESOLINE) injection 10-20 mg  10-20 mg Intravenous Q4H PRN Jennye Boroughs, MD   10 mg at 07/01/20 1526   insulin aspart (novoLOG) injection 0-20 Units  0-20 Units Subcutaneous Q4H Jennye Boroughs, MD   4 Units at 07/12/20 0929   insulin aspart (novoLOG) injection 3 Units  3 Units Subcutaneous Q4H Jennye Boroughs, MD   3 Units at 07/12/20 0929   insulin glargine (LANTUS) injection 20 Units  20 Units Subcutaneous Daily Jennye Boroughs, MD   20 Units at 07/12/20 3016   labetalol (NORMODYNE) injection 10-20 mg  10-20 mg Intravenous Q2H PRN Jennye Boroughs, MD   10 mg at 07/04/20 1732   metoprolol tartrate (LOPRESSOR) tablet 25 mg  25 mg Per Tube BID Jennye Boroughs, MD   25 mg at 07/12/20 0930   multivitamin (RENA-VIT) tablet 1 tablet  1 tablet Per Tube Rondel Baton, MD   1 tablet at 07/11/20 2050   nutrition supplement (JUVEN) (JUVEN) powder packet 1 packet  1 packet Per Tube BID Jennye Boroughs, MD   1 packet at 07/12/20 0930   ondansetron (ZOFRAN) tablet 4 mg  4 mg Per Tube Q6H PRN Jennye Boroughs, MD       Or   ondansetron (ZOFRAN) injection 4 mg  4 mg Intravenous Q6H PRN Jennye Boroughs, MD       oxyCODONE-acetaminophen (PERCOCET/ROXICET) 5-325 MG per tablet 1 tablet  1 tablet Per NG tube Q3H PRN Jennye Boroughs, MD   1 tablet at 07/11/20 2047   pantoprazole sodium (PROTONIX) 40 mg/20 mL oral suspension 40 mg  40 mg Per Tube Daily Lu Duffel, RPH   40 mg at 07/12/20 0935   prazosin (MINIPRESS) capsule 1 mg  1 mg Per Tube BID PRN Jennye Boroughs, MD   1 mg at 06/25/20 0913   prazosin (MINIPRESS) capsule 4 mg  4 mg Per Tube Rondel Baton, MD    4 mg at 07/11/20 2049   QUEtiapine (SEROQUEL) tablet 12.5 mg  12.5 mg Per Tube BID Jennye Boroughs, MD   12.5 mg at 07/12/20 0109   sodium chloride flush (NS) 0.9 % injection 10-40 mL  10-40 mL Intracatheter Q12H Jennye Boroughs, MD   10 mL at 07/11/20 2112   sodium chloride flush (NS) 0.9 % injection 10-40 mL  10-40 mL Intracatheter PRN Jennye Boroughs, MD   30 mL at 07/01/20 0138   traZODone (DESYREL) tablet 150 mg  150 mg Per Tube Rondel Baton, MD   150 mg at 07/11/20 2049     Discharge Medications: Please see discharge summary for a list of discharge medications.  Relevant Imaging Results:  Relevant Lab Results:   Additional Information SSN [id number]  Pete Pelt, RN

## 2020-07-12 NOTE — Progress Notes (Signed)
Physical Therapy Treatment Patient Details Name: Wyatt Ball. MRN: [id number] DOB: 22-May-1960 Today's Date: 07/12/2020    History of Present Illness Wyatt Ball. is a 60 y.o. 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  Since hospitalization pt has been intubated, finally extubated 6/15.    PT Comments    Treatment limited today secondary to lethargy. Pt intermittently falling asleep during session, but was able to open eyes with verbal stimuli. Pt with improved participation with BLE therex, however, only able to complete 3 exercises due to lethargy that worsened with time. Pt will continue to benefit from skilled acute PT services to address deficits for return to baseline function. Will continue to recommend SNF vs. LTC at [place].    Follow Up Recommendations  SNF;Other (comment) (Long term care)     Equipment Recommendations   (TBD in next venue)       Precautions / Restrictions Precautions Precautions: Fall Restrictions Weight Bearing Restrictions: No    Mobility  Bed Mobility      General bed mobility comments: deferred due to lethargy and inability to consistently follow commands       Balance       Sitting balance - Comments: deferred due to lethargy and inability to consistently follow commands        Cognition Arousal/Alertness: Lethargic Behavior During Therapy: Flat affect Overall Cognitive Status: No family/caregiver present to determine baseline cognitive functioning            General Comments: Able to state name with max cues; overall lethargic and had increased difficulty staying awake during  session      Exercises General Exercises - Lower Extremity Ankle Circles/Pumps: PROM;AAROM;Strengthening;15 reps Quad Sets: AAROM;Strengthening;15 reps Hip ABduction/ADduction: PROM;AAROM;15 reps;Strengthening Other Exercises Other Exercises: Pt able to participate in x15 BLE therex including: ankle pumps, quad sets, and isometric hip ADD. Intermittent assist ranging from min guard-total assist required due to lethargy. Other Exercises: Pt educated regarding: PT role/POC and benefits of participation with PT        Pertinent Vitals/Pain Pain Assessment: No/denies pain     PT Goals (current goals can now be found in the care plan section) Acute Rehab PT Goals Patient Stated Goal: To drink a diet coke. Progress towards PT goals: Not progressing toward goals - comment (continues to require total assist/care due to lethargy and weakness)    Frequency    Min 2X/week      PT Plan Current plan remains appropriate       AM-PAC PT "6 Clicks" Mobility   Outcome Measure  Help needed turning from your back to your side while in a flat bed without using bedrails?: Total Help needed moving from lying on your back to sitting on the side of a flat bed without using bedrails?: Total Help needed moving to and from a bed to a chair (including a wheelchair)?: Total Help needed standing up from a chair using your arms (e.g., wheelchair or bedside chair)?: Total Help needed to walk in hospital room?: Total Help needed climbing 3-5 steps with a railing? : Total 6 Click Score: 6    End of Session   Activity Tolerance: Patient limited by  fatigue Patient left: in bed;with bed alarm set Nurse Communication: Mobility status PT Visit Diagnosis: Muscle weakness (generalized) (M62.81);Adult, failure to thrive (R62.7);Pain     Time: 9833-8250 PT Time Calculation (min) (ACUTE ONLY): 14 min  Charges:  $Therapeutic Exercise: 8-22 mins                       Vira Blanco, PT, DPT 12:58  PM,07/12/20

## 2020-07-12 NOTE — TOC Progression Note (Signed)
Transition of Care (TOC) - Progression Note    Patient Details  Name: Wyatt Ball. MRN: [id number] Date of Birth: 1960/04/30  [facility]) CM/SW Contact  Caryn Section, RN Phone Number: 07/12/2020, 9:44 AM  Clinical Narrative:   [name] from [facility] notified RNCM that patient has SNF authorization for [facility] facilities.  Patient's brother contacted, stated he would like patient to go to a facility in [place].  Bed search started [facility] contact information given, tOC to follow    Expected Discharge Plan: Skilled Nursing Facility Barriers to Discharge: Continued Medical Work up  Expected Discharge Plan and Services Expected Discharge Plan: Skilled Nursing Facility In-house Referral: NA   Post Acute Care Choice: Skilled Nursing Facility Living arrangements for the past 2 months: Single Family Home                                       Social Determinants of Health (SDOH) Interventions    Readmission Risk Interventions No flowsheet data found.

## 2020-07-12 NOTE — Progress Notes (Addendum)
PROGRESS NOTE    Wyatt Ball.  ION:[id number] DOB: 1960/09/08 DOA: 06/04/2020 PCP: System, Provider Not In   Chief Complaint  Patient presents with   Altered Mental Status     Brief Narrative: 60 year old male from the [facility] with PMH of HTN, DM2, Transverse Myelitis complicated by Neurogenic Bladder with intermittent catherization and Recurrent UTIs, Chronic COPD, and Chronic Pain Syndrome with Opiate Use who was admitted on 5/31 with altered mental status and fevers up to 101.9 and cough x 3 days found to have septic shock due to multifocal pneumonia requiring pressors and complicated by acute renal failure requiring dialysis.  CT Scan shows bilateral nodular opacities Left>Right.  COVID and Flu was negative.  UA with 80 ketones and rare bacteria.  Ucx NG.  Scx NG.  Blood Cultures grew 1/4 MSSE.  UDS positive for opiates and TCAs.  He is on multiple pain meds at home.  6/2 Intubated and Bronchoscopy with negative cultures. 6/3 Self Extubated and Re-intubated.   MRI of brain showed no acute abnormality. 6/6 Left IJV 6/7 HD started 6/15 Extubated 6/19 Transferred to [facility] 6/24 Recurrent Fevers, treated with short course of antibiotics. 6/26 Right PICC line placed. 6/27 HD stopped.  Goals of care discussed with palliative team 6/29 EGD and G Tube placed for persistent dysphagia.  Subjective: Denies any fevers, chills, cough, or shortness of breath. Vitals have been stable. Neurological exam is stable.  Patient answers questions appropriately. He appears a little fatigued his am. PICC line was placed on 6.26.  It looks ok. G tube looks ok.  Abdominal exam is benign.  SLP is working with patient.  Tube Feeds are running at 55 CC/hr. Foley will be exchanged today.   Assessment & Plan: Principal Problem:   CAP (community acquired pneumonia) Active Problems:   Acute metabolic encephalopathy   Severe sepsis with septic shock (HCC)   Neurogenic bladder   Chronic,  continuous use of opioids   Chronic low back pain   Type 2 diabetes mellitus without complication (HCC)   Migraines   Recurrent UTI   History of colon polyps   Urinary retention   Altered mental status   Pressure injury of skin   AKI (acute kidney injury) ([place])   Malnutrition of moderate degree  Multifocal Pneumonia, Left> Right: Treated with roughly 14 day course of antibiotics per ID recommendations: - Monitor.  Acute Kidney Injury on CKD: - Creatinine decreased from 2.9 to 2.7 mg/dL - this is his baseline.  Continue FWF. - Avoid nephrotoxic meds and monitor renal function. - Family does not want to pursue further dialysis sessions if needed.  Hypertension: - Continue Amlodipine 5 mg daily and Clonidine patch weekly. - Increase Lopressor from 25 to 50 BID.  Diabetes Mellitus Type 2: - Glucose is stable.   - Continue Glargine 20 units nightly, Novolog 3 units q4hrs, and SSI  Chronic Pain:  - Gabapentin 100 mg TID.  Nausea:  - Zofran PRN.  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 - Exchange Foley weekly.  Acute Dysphagia s/p 6/29 G tube in place: - SLP is working with patient, appreciate.  Agitation / PTSD: - Seroquel 12.5 mg BID. - If there is any agitation, we will increase Seroquel to 25 mg BID.  Insomnia: - Trazodone 150 mg HS.  Chronic Opiate Use: - Oxycodone PRN.  Physical Deconditioning: - PT/OT recommends SNF.  Code Status:  -  DNR/DNI  Lines: Right PICC line placed on 6/26. Tube: G tube placed on 6/29. Foley: Foley exchanged on 7/8.  Exchange weekly.  Diet Order             DIET - DYS 1 Room service appropriate? Yes; Fluid consistency: Thin  Diet effective now                   Nutrition Problem: Moderate Malnutrition (in the context of chronic illness) Etiology: inability to eat Signs/Symptoms: mild fat depletion, mild muscle depletion, moderate muscle  depletion Interventions: Tube feeding, Juven Patient's Body mass index is 28.59 kg/m.   Pressure Injury 06/23/20 Coccyx Posterior;Mid Stage 2 -  Partial thickness loss of dermis presenting as a shallow open injury with a red, pink wound bed without slough. (Active)  06/23/20 1400  Location: Coccyx  Location Orientation: Posterior;Mid  Staging: Stage 2 -  Partial thickness loss of dermis presenting as a shallow open injury with a red, pink wound bed without slough.  Wound Description (Comments):   Present on Admission: Yes    DVT prophylaxis: heparin injection 5,000 Units Start: 06/10/20 2200 Code Status:   Code Status: DNR  Family Communication: plan of care discussed with patient at bedside.  Status is: Inpatient  Remains inpatient appropriate because:Inpatient level of care appropriate due to severity of illness  Dispo: The patient is from: Home              Anticipated d/c is to: Discharge is pending SNF placement with the [facility].              Patient currently is not medically stable to d/c.   Difficult to place patient No    Unresulted Labs (From admission, onward)     Start     Ordered   07/12/20 4825  Basic metabolic panel  Daily,   R     Question:  Specimen collection method  Answer:  Unit=Unit collect   07/11/20 2132   06/06/20 1819  Pneumocystis smear by DFA  ONCE - STAT,   STAT       Question:  Patient immune status  Answer:  Immunocompromised   06/06/20 1820   06/06/20 1818  Acid Fast Culture with reflexed sensitivities  (AFB smear + Culture w reflexed sensitivities panel)  Once,   R       Question:  Patient immune status  Answer:  Immunocompromised  See Hyperspace for full Linked Orders Report.   06/06/20 1820   Signed and Held  Phosphorus  Once,   R       Question:  Specimen collection method  Answer:  Unit=Unit collect   Signed and Held   Signed and Held  Phosphorus  Once,   R       Question:  Specimen collection method  Answer:  Unit=Unit collect   Signed  and Held   Signed and Held  Phosphorus  Once,   R       Question:  Specimen collection method  Answer:  Unit=Unit collect   Signed and Held             Medications reviewed:  Scheduled Meds:  alteplase  4 mg Intracatheter Once   amLODipine  5 mg Per Tube Daily   chlorhexidine gluconate (MEDLINE KIT)  15 mL Mouth Rinse BID   Chlorhexidine Gluconate Cloth  6 each Topical Daily   cloNIDine  0.3 mg Transdermal Weekly   feeding supplement (PROSource TF)  45 mL Per  Tube Daily   free water  150 mL Per Tube Q4H   gabapentin  100 mg Per Tube Q8H   heparin injection (subcutaneous)  5,000 Units Subcutaneous Q8H   insulin aspart  0-20 Units Subcutaneous Q4H   insulin aspart  3 Units Subcutaneous Q4H   insulin glargine  20 Units Subcutaneous Daily   metoprolol tartrate  25 mg Per Tube BID   multivitamin  1 tablet Per Tube QHS   nutrition supplement (JUVEN)  1 packet Per Tube BID   pantoprazole sodium  40 mg Per Tube Daily   prazosin  4 mg Per Tube QHS   QUEtiapine  12.5 mg Per Tube BID   sodium chloride flush  10-40 mL Intracatheter Q12H   traZODone  150 mg Per Tube QHS   Continuous Infusions:  sodium chloride Stopped (06/22/20 1614)   feeding supplement (NEPRO CARB STEADY) 1,000 mL (07/12/20 1411)    Consultants:see note  Procedures:see note  Antimicrobials: Anti-infectives (From admission, onward)    Start     Dose/Rate Route Frequency Ordered Stop   06/28/20 1815  vancomycin (VANCOREADY) IVPB 1750 mg/350 mL        1,750 mg 175 mL/hr over 120 Minutes Intravenous  Once 06/28/20 1726 06/28/20 2054   06/28/20 1815  meropenem (MERREM) 1 g in sodium chloride 0.9 % 100 mL IVPB  Status:  Discontinued        1 g 200 mL/hr over 30 Minutes Intravenous Every 12 hours 06/28/20 1726 07/01/20 1558   06/28/20 1742  vancomycin variable dose per unstable renal function (pharmacist dosing)  Status:  Discontinued         Does not apply See admin instructions 06/28/20 1742 06/30/20 0757    06/26/20 2100  fluconazole (DIFLUCAN) IVPB 50 mg       See Hyperspace for full Linked Orders Report.   50 mg 25 mL/hr over 60 Minutes Intravenous Every 24 hours 06/25/20 1835 06/30/20 2118   06/25/20 2000  fluconazole (DIFLUCAN) IVPB 200 mg       See Hyperspace for full Linked Orders Report.   200 mg 100 mL/hr over 60 Minutes Intravenous  Once 06/25/20 1835 06/25/20 2348   06/14/20 1800  levofloxacin (LEVAQUIN) IVPB 500 mg        500 mg 100 mL/hr over 60 Minutes Intravenous Every 48 hours 06/12/20 1624 06/19/20 0210   06/12/20 1800  levofloxacin (LEVAQUIN) IVPB 750 mg        750 mg 100 mL/hr over 90 Minutes Intravenous  Once 06/11/20 2239 06/12/20 1904   06/11/20 1800  meropenem (MERREM) 500 mg in sodium chloride 0.9 % 100 mL IVPB  Status:  Discontinued        500 mg 200 mL/hr over 30 Minutes Intravenous Every 24 hours 06/10/20 1444 06/11/20 2231   06/10/20 1300  doxycycline (VIBRAMYCIN) 100 mg in sodium chloride 0.9 % 250 mL IVPB  Status:  Discontinued        100 mg 125 mL/hr over 120 Minutes Intravenous Every 12 hours 06/10/20 1149 06/17/20 1704   06/09/20 1015  meropenem (MERREM) 500 mg in sodium chloride 0.9 % 100 mL IVPB        500 mg 200 mL/hr over 30 Minutes Intravenous Every 12 hours 06/09/20 0922 06/11/20 1203   06/08/20 2200  meropenem (MERREM) 1 g in sodium chloride 0.9 % 100 mL IVPB  Status:  Discontinued        1 g 200 mL/hr over 30 Minutes Intravenous Every 12  hours 06/08/20 0845 06/09/20 0922   06/07/20 2200  azithromycin (ZITHROMAX) 500 mg in sodium chloride 0.9 % 250 mL IVPB        500 mg 250 mL/hr over 60 Minutes Intravenous Every 24 hours 06/07/20 1441 06/11/20 2211   06/07/20 1600  ceFEPIme (MAXIPIME) 2 g in sodium chloride 0.9 % 100 mL IVPB  Status:  Discontinued        2 g 200 mL/hr over 30 Minutes Intravenous Every 8 hours 06/07/20 1441 06/07/20 1442   06/07/20 1600  meropenem (MERREM) 1 g in sodium chloride 0.9 % 100 mL IVPB  Status:  Discontinued        1  g 200 mL/hr over 30 Minutes Intravenous Every 8 hours 06/07/20 1442 06/08/20 0845   06/06/20 1630  vancomycin (VANCOREADY) IVPB 1500 mg/300 mL  Status:  Discontinued        1,500 mg 150 mL/hr over 120 Minutes Intravenous  Once 06/06/20 1533 06/06/20 1632   06/05/20 0600  cefTRIAXone (ROCEPHIN) 2 g in sodium chloride 0.9 % 100 mL IVPB  Status:  Discontinued        2 g 200 mL/hr over 30 Minutes Intravenous Every 24 hours 06/04/20 2029 06/07/20 1439   06/04/20 2200  azithromycin (ZITHROMAX) 500 mg in sodium chloride 0.9 % 250 mL IVPB        500 mg 250 mL/hr over 60 Minutes Intravenous Every 24 hours 06/04/20 2029 06/07/20 0003   06/04/20 1915  ceFEPIme (MAXIPIME) 2 g in sodium chloride 0.9 % 100 mL IVPB        2 g 200 mL/hr over 30 Minutes Intravenous  Once 06/04/20 1906 06/04/20 2055   06/04/20 1915  metroNIDAZOLE (FLAGYL) IVPB 500 mg        500 mg 100 mL/hr over 60 Minutes Intravenous  Once 06/04/20 1906 06/04/20 2055   06/04/20 1915  vancomycin (VANCOCIN) IVPB 1000 mg/200 mL premix  Status:  Discontinued        1,000 mg 200 mL/hr over 60 Minutes Intravenous  Once 06/04/20 1906 06/04/20 1912   06/04/20 1915  vancomycin (VANCOREADY) IVPB 1750 mg/350 mL        1,750 mg 175 mL/hr over 120 Minutes Intravenous  Once 06/04/20 1912 06/04/20 2257      Culture/Microbiology    Component Value Date/Time   SDES BLOOD RIGHT HAND 06/28/2020 1705   SDES BLOOD LEFT ANTECUBITAL 06/28/2020 1705   SPECREQUEST  06/28/2020 1705    BOTTLES DRAWN AEROBIC AND ANAEROBIC Blood Culture results may not be optimal due to an excessive volume of blood received in culture bottles   SPECREQUEST  06/28/2020 1705    BOTTLES DRAWN AEROBIC AND ANAEROBIC Blood Culture adequate volume   CULT  06/28/2020 1705    NO GROWTH 5 DAYS Performed at [facility], [address] [place], [place] [address]    CULT  06/28/2020 1705    NO GROWTH 5 DAYS Performed at [facility], [address] [place], [place] [address]    REPTSTATUS 07/03/2020 FINAL 06/28/2020 1705   REPTSTATUS 07/03/2020 FINAL 06/28/2020 1705    Other culture-see note  Objective: Vitals: Today's Vitals   07/11/20 2100 07/12/20 0400 07/12/20 0435 07/12/20 0831  BP:  133/88  121/71  Pulse:  92  (!) 109  Resp:  16  18  Temp:  97.6 F (36.4 C)  98.1 F (36.7 C)  TempSrc:  Oral    SpO2:  98%  100%  Weight:  80.3 kg   Height:      PainSc: 8        Intake/Output Summary (Last 24 hours) at 07/12/2020 1522 Last data filed at 07/12/2020 1014 Gross per 24 hour  Intake 360 ml  Output 3975 ml  Net -3615 ml    Filed Weights   07/10/20 0409 07/11/20 0300 07/12/20 0435  Weight: 78.9 kg 78.5 kg 80.3 kg   Weight change: 1.8 kg  Intake/Output from previous day: 07/07 0701 - 07/08 0700 In: 0  Out: 3975 [Urine:3975] Intake/Output this shift: Total I/O In: 360 [P.O.:360] Out: -  Filed Weights   07/10/20 0409 07/11/20 0300 07/12/20 0435  Weight: 78.9 kg 78.5 kg 80.3 kg    Examination: General exam: AA, chronically ill appearing, NAD HEENT:PERRL, EOMI Respiratory system: bilaterally diminished, no use of accessory muscle, non tender. Cardiovascular system: S1 & S2 +, No murmur, No JVD. Gastrointestinal system: Abdomen soft, NT,ND, G Tube in place. Nervous System: No acute focal deficits Extremities: No edema, distal peripheral pulses palpable, offloading boots in place. GU: phallus with subcoronal hypospadias and patulous meatus.  14 Fr Foley in place. Skin: No rashes,no icterus. MSK: physical deconditioning.  Data Reviewed: I have personally reviewed following labs and imaging studies CBC: Recent Labs  Lab 07/08/20 0609 07/12/20 0500  WBC 7.9 10.0  HGB 8.7* 8.4*  HCT 25.6* 25.6*  MCV 88.3 89.8  PLT 241 867    Basic Metabolic Panel: Recent Labs  Lab 07/07/20 0914 07/08/20 0609 07/09/20 0602 07/10/20 0929 07/11/20 0851 07/12/20 0500  NA 141 140 140 144 137 136  K 3.8 3.3* 3.8 4.1 3.8  3.3*  CL 106 104 105 106 98 100  CO2 26 27 28 29 30 29   GLUCOSE 168* 162* 183* 137* 154* 136*  BUN 134* 119* 118* 109* 104* 108*  CREATININE 3.30* 3.30* 3.23* 3.10* 2.99* 2.79*  CALCIUM 10.2 10.0 10.8* 11.5* 10.9* 10.7*  MG 2.3  --   --   --   --   --   PHOS  --  4.2  --   --   --   --      CBG: Recent Labs  Lab 07/11/20 2023 07/12/20 0053 07/12/20 0356 07/12/20 0832 07/12/20 1332  GLUCAP 136* 155* 136* 153* 137*      Radiology Studies: No results found.   LOS: 92 days   George Hugh, MD [facility]  07/12/2020, 3:22 PM

## 2020-07-12 NOTE — Progress Notes (Signed)
  Speech Language Pathology Treatment: Cognitive-Linquistic  Patient Details Name: Wyatt Ball. MRN: [id number] DOB: June 13, 1960 Today's Date: 07/12/2020 Time: 8366-2947 SLP Time Calculation (min) (ACUTE ONLY): 13 min  Assessment / Plan / Recommendation Clinical Impression  Pt seen for ongoing treatment of cognition deficits. Pt recognized this Clinical research associate when entering room. Pt with increased conversation and joking today. He is able to express his wants/needs with a good quality intelligible voice. After ~ 7 minutes pt required cues for alertness. He had difficulty choosing between 2 choices for month and year. Session ended early d/t fatigue.     HPI HPI: Wyatt Ball. is a 60 y.o. 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 Pt intubated from 06/06/2020 thru 06/19/2020, PEG placed 07/03/2020.      SLP Plan  Continue with current plan of care       Recommendations                   Oral Care Recommendations: Oral care BID Follow up Recommendations: Skilled Nursing facility SLP Visit Diagnosis: Cognitive communication deficit (R41.841) Plan: Continue with current plan of care       GO               Wyatt Ball B. Dreama Saa M.S., CCC-SLP, [facility] Speech-Language Pathologist Rehabilitation Services Office [phone]  [name] 07/12/2020, 3:11 PM

## 2020-07-13 ENCOUNTER — Inpatient Hospital Stay: Payer: No Typology Code available for payment source

## 2020-07-13 DIAGNOSIS — J189 Pneumonia, unspecified organism: Secondary | ICD-10-CM | POA: Diagnosis not present

## 2020-07-13 LAB — BASIC METABOLIC PANEL
Anion gap: 8 (ref 5–15)
BUN: 106 mg/dL — ABNORMAL HIGH (ref 8–23)
CO2: 29 mmol/L (ref 22–32)
Calcium: 11 mg/dL — ABNORMAL HIGH (ref 8.9–10.3)
Chloride: 97 mmol/L — ABNORMAL LOW (ref 98–111)
Creatinine, Ser: 2.75 mg/dL — ABNORMAL HIGH (ref 0.61–1.24)
GFR, Estimated: 25 mL/min — ABNORMAL LOW (ref >60)
Glucose, Bld: 173 mg/dL — ABNORMAL HIGH (ref 70–99)
Potassium: 3.8 mmol/L (ref 3.5–5.1)
Sodium: 134 mmol/L — ABNORMAL LOW (ref 135–145)

## 2020-07-13 LAB — GLUCOSE, CAPILLARY
Glucose-Capillary: 128 mg/dL — ABNORMAL HIGH (ref 70–99)
Glucose-Capillary: 133 mg/dL — ABNORMAL HIGH (ref 70–99)
Glucose-Capillary: 170 mg/dL — ABNORMAL HIGH (ref 70–99)
Glucose-Capillary: 170 mg/dL — ABNORMAL HIGH (ref 70–99)
Glucose-Capillary: 173 mg/dL — ABNORMAL HIGH (ref 70–99)
Glucose-Capillary: 184 mg/dL — ABNORMAL HIGH (ref 70–99)
Glucose-Capillary: 188 mg/dL — ABNORMAL HIGH (ref 70–99)
Glucose-Capillary: 205 mg/dL — ABNORMAL HIGH (ref 70–99)

## 2020-07-13 IMAGING — CR DG ABDOMEN 1V
1 series · 1 of 1 positions shown · non-contrast
Comparison: None.

CLINICAL DATA: Altered mental status.  Nausea

EXAM:
ABDOMEN - 1 VIEW

[abdomen kub]
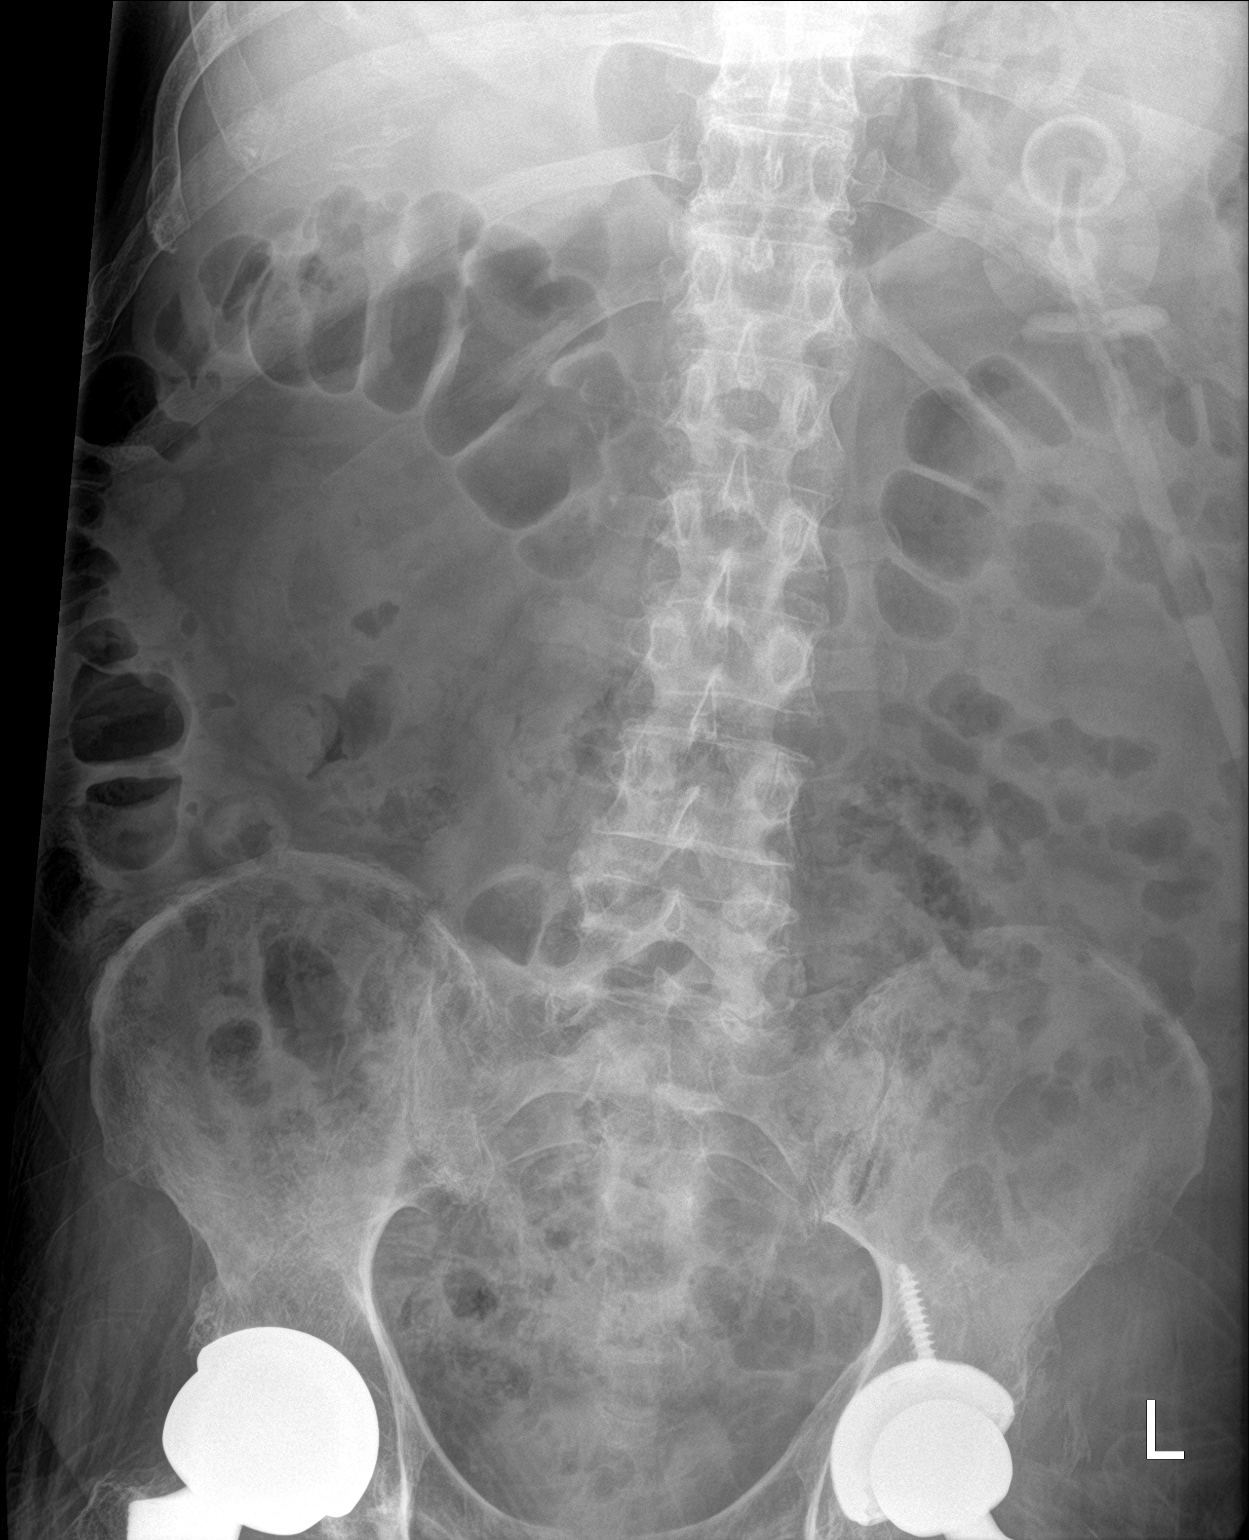

[1 of 1 positions shown; findings below may reference images not displayed]

FINDINGS: Gastrostomy tube projects over the stomach. Nonobstructive bowel gas
pattern. No organomegaly or free air.
IMPRESSION: No acute findings.

## 2020-07-13 MED ORDER — LUBIPROSTONE 24 MCG PO CAPS
24.0000 ug | ORAL_CAPSULE | Freq: Two times a day (BID) | ORAL | Status: DC
  Administered 2020-07-13 – 2020-07-14 (×2): 24 ug via ORAL
  Filled 2020-07-13 (×4): qty 1

## 2020-07-13 MED ORDER — SENNOSIDES-DOCUSATE SODIUM 8.6-50 MG PO TABS
2.0000 | ORAL_TABLET | Freq: Every day | ORAL | Status: DC
  Administered 2020-07-14: 2 via ORAL
  Filled 2020-07-13 (×2): qty 2

## 2020-07-13 MED ORDER — METOPROLOL TARTRATE 50 MG PO TABS
100.0000 mg | ORAL_TABLET | Freq: Two times a day (BID) | ORAL | Status: DC
  Administered 2020-07-13 – 2020-08-02 (×39): 100 mg
  Filled 2020-07-13 (×40): qty 2

## 2020-07-13 MED ORDER — POLYETHYLENE GLYCOL 3350 17 G PO PACK
17.0000 g | PACK | Freq: Every day | ORAL | Status: DC
  Administered 2020-07-14: 12:00:00 17 g via ORAL
  Filled 2020-07-13 (×2): qty 1

## 2020-07-13 NOTE — Progress Notes (Addendum)
PROGRESS NOTE    Wyatt Ball.  BJY:[id number] DOB: Aug 19, 1960 DOA: 06/04/2020 PCP: System, Provider Not In   Chief Complaint  Patient presents with   Altered Mental Status     Brief Narrative: 60 year old male from the [facility] with PMH of HTN, DM2, Transverse Myelitis complicated by Neurogenic Bladder with intermittent catherization and Recurrent UTIs, Chronic COPD, and Chronic Pain Syndrome with Opiate Use who was admitted on 5/31 with altered mental status and fevers up to 101.9 and cough x 3 days found to have septic shock due to multifocal pneumonia requiring pressors and complicated by acute renal failure requiring dialysis.  CT Scan shows bilateral nodular opacities Left>Right.  COVID and Flu was negative.  UA with 80 ketones and rare bacteria.  Ucx NG.  Scx NG.  Blood Cultures grew 1/4 MSSE.  UDS positive for opiates and TCAs.  He is on multiple pain meds at home.  6/2 Intubated and Bronchoscopy with negative cultures. 6/3 Self Extubated and Re-intubated.   MRI of brain showed no acute abnormality. 6/6 Left IJV 6/7 HD started 6/15 Extubated 6/19 Transferred to [facility] 6/24 Recurrent Fevers, treated with short course of antibiotics. 6/26 Right PICC line placed. 6/27 HD stopped.  Goals of care discussed with palliative team 6/29 EGD and G Tube placed for persistent dysphagia.  Subjective: Patient states his stomach aches. Abdominal Exam is benign. Abdominal Xray shows mild constipation.  Patient denies fevers, shortness of breath, cough, or chest pain. Vitals Are stable. Neuro exam is stable. Patient is conversational.  Vocal cords are ok.  PICC line looks ok. G tube looks ok.  Abdominal exam is benign.  SLP is working with patient.  Tube Feeds are running at 55 CC/hr. Foley is in place.   Assessment & Plan: Principal Problem:   CAP (community acquired pneumonia) Active Problems:   Acute metabolic encephalopathy   Severe sepsis with septic shock (HCC)    Neurogenic bladder   Chronic, continuous use of opioids   Chronic low back pain   Type 2 diabetes mellitus without complication (HCC)   Migraines   Recurrent UTI   History of colon polyps   Urinary retention   Altered mental status   Pressure injury of skin   AKI (acute kidney injury) ([place])   Malnutrition of moderate degree  Acute Constipation: Abdominal Xray shows mild gaseous distention. - Start Senna BID and Amitiza 24 mcg BID. - Monitor bowel movements.  Multifocal Pneumonia, Left> Right: Treated with roughly 14 day course of antibiotics per ID recommendations: - Monitor.  Acute Kidney Injury on CKD: - Creatinine decreased from 2.9 to 2.7 mg/dL - this is his baseline.  Continue FWF. - Avoid nephrotoxic meds and monitor renal function. - Family does not want to pursue further dialysis sessions if needed.  Hypertension: - Continue Amlodipine 5 mg daily and Clonidine patch weekly. - Increase Lopressor to 100 mg BID.  Diabetes Mellitus Type 2: - Glucose is stable.   - Continue Glargine 20 units nightly, Novolog 3 units q4hrs, and SSI  Chronic Pain:  - Gabapentin 100 mg TID.  Nausea:  - Zofran PRN.  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 - Exchange Foley weekly.  Acute Dysphagia s/p 6/29 G tube in place: - SLP is working with patient, appreciate.  Agitation / PTSD: - Seroquel 12.5 mg BID. - If there is any agitation, we will increase Seroquel to 25 mg  BID.  Insomnia: - Trazodone 150 mg HS.  Chronic Opiate Use: - Oxycodone PRN.  Physical Deconditioning: - PT/OT recommends SNF.  Code Status:  - DNR/DNI  Lines: Right PICC line placed on 6/26. Tube: G tube placed on 6/29. Foley: Foley exchanged on 7/8.  Exchange weekly.  Diet Order             DIET - DYS 1 Room service appropriate? Yes; Fluid consistency: Thin  Diet effective now                   Nutrition  Problem: Moderate Malnutrition (in the context of chronic illness) Etiology: inability to eat Signs/Symptoms: mild fat depletion, mild muscle depletion, moderate muscle depletion Interventions: Tube feeding, Juven Patient's Body mass index is 28.12 kg/m.   Pressure Injury 06/23/20 Coccyx Posterior;Mid Stage 2 -  Partial thickness loss of dermis presenting as a shallow open injury with a red, pink wound bed without slough. (Active)  06/23/20 1400  Location: Coccyx  Location Orientation: Posterior;Mid  Staging: Stage 2 -  Partial thickness loss of dermis presenting as a shallow open injury with a red, pink wound bed without slough.  Wound Description (Comments):   Present on Admission: Yes    DVT prophylaxis: heparin injection 5,000 Units Start: 06/10/20 2200 Code Status:   Code Status: DNR  Family Communication: plan of care discussed with patient at bedside.  Status is: Inpatient  Remains inpatient appropriate because:Inpatient level of care appropriate due to severity of illness  Dispo: The patient is from: Home              Anticipated d/c is to: Discharge is pending SNF placement with the [facility].              Patient currently is not medically stable to d/c.   Difficult to place patient No    Unresulted Labs (From admission, onward)     Start     Ordered   07/12/20 0630  Basic metabolic panel  Daily,   R     Question:  Specimen collection method  Answer:  Unit=Unit collect   07/11/20 2132   06/06/20 1819  Pneumocystis smear by DFA  ONCE - STAT,   STAT       Question:  Patient immune status  Answer:  Immunocompromised   06/06/20 1820   06/06/20 1818  Acid Fast Culture with reflexed sensitivities  (AFB smear + Culture w reflexed sensitivities panel)  Once,   R       Question:  Patient immune status  Answer:  Immunocompromised  See Hyperspace for full Linked Orders Report.   06/06/20 1820   Signed and Held  Phosphorus  Once,   R       Question:  Specimen collection method   Answer:  Unit=Unit collect   Signed and Held   Signed and Held  Phosphorus  Once,   R       Question:  Specimen collection method  Answer:  Unit=Unit collect   Signed and Held   Signed and Held  Phosphorus  Once,   R       Question:  Specimen collection method  Answer:  Unit=Unit collect   Signed and Held             Medications reviewed:  Scheduled Meds:  amLODipine  5 mg Per Tube Daily   chlorhexidine gluconate (MEDLINE KIT)  15 mL Mouth Rinse BID   Chlorhexidine Gluconate Cloth  6 each Topical  Daily   cloNIDine  0.3 mg Transdermal Weekly   feeding supplement (PROSource TF)  45 mL Per Tube Daily   free water  150 mL Per Tube Q4H   gabapentin  100 mg Per Tube Q8H   heparin injection (subcutaneous)  5,000 Units Subcutaneous Q8H   insulin aspart  0-20 Units Subcutaneous Q4H   insulin aspart  3 Units Subcutaneous Q4H   insulin glargine  20 Units Subcutaneous Daily   metoprolol tartrate  50 mg Per Tube BID   multivitamin  1 tablet Per Tube QHS   nutrition supplement (JUVEN)  1 packet Per Tube BID   pantoprazole sodium  40 mg Per Tube Daily   prazosin  4 mg Per Tube QHS   QUEtiapine  12.5 mg Per Tube BID   sodium chloride flush  10-40 mL Intracatheter Q12H   traZODone  150 mg Per Tube QHS   Continuous Infusions:  sodium chloride Stopped (06/22/20 1614)   feeding supplement (NEPRO CARB STEADY) 1,000 mL (07/12/20 1411)    Consultants:see note  Procedures:see note  Antimicrobials: Anti-infectives (From admission, onward)    Start     Dose/Rate Route Frequency Ordered Stop   06/28/20 1815  vancomycin (VANCOREADY) IVPB 1750 mg/350 mL        1,750 mg 175 mL/hr over 120 Minutes Intravenous  Once 06/28/20 1726 06/28/20 2054   06/28/20 1815  meropenem (MERREM) 1 g in sodium chloride 0.9 % 100 mL IVPB  Status:  Discontinued        1 g 200 mL/hr over 30 Minutes Intravenous Every 12 hours 06/28/20 1726 07/01/20 1558   06/28/20 1742  vancomycin variable dose per unstable renal  function (pharmacist dosing)  Status:  Discontinued         Does not apply See admin instructions 06/28/20 1742 06/30/20 0757   06/26/20 2100  fluconazole (DIFLUCAN) IVPB 50 mg       See Hyperspace for full Linked Orders Report.   50 mg 25 mL/hr over 60 Minutes Intravenous Every 24 hours 06/25/20 1835 06/30/20 2118   06/25/20 2000  fluconazole (DIFLUCAN) IVPB 200 mg       See Hyperspace for full Linked Orders Report.   200 mg 100 mL/hr over 60 Minutes Intravenous  Once 06/25/20 1835 06/25/20 2348   06/14/20 1800  levofloxacin (LEVAQUIN) IVPB 500 mg        500 mg 100 mL/hr over 60 Minutes Intravenous Every 48 hours 06/12/20 1624 06/19/20 0210   06/12/20 1800  levofloxacin (LEVAQUIN) IVPB 750 mg        750 mg 100 mL/hr over 90 Minutes Intravenous  Once 06/11/20 2239 06/12/20 1904   06/11/20 1800  meropenem (MERREM) 500 mg in sodium chloride 0.9 % 100 mL IVPB  Status:  Discontinued        500 mg 200 mL/hr over 30 Minutes Intravenous Every 24 hours 06/10/20 1444 06/11/20 2231   06/10/20 1300  doxycycline (VIBRAMYCIN) 100 mg in sodium chloride 0.9 % 250 mL IVPB  Status:  Discontinued        100 mg 125 mL/hr over 120 Minutes Intravenous Every 12 hours 06/10/20 1149 06/17/20 1704   06/09/20 1015  meropenem (MERREM) 500 mg in sodium chloride 0.9 % 100 mL IVPB        500 mg 200 mL/hr over 30 Minutes Intravenous Every 12 hours 06/09/20 0922 06/11/20 1203   06/08/20 2200  meropenem (MERREM) 1 g in sodium chloride 0.9 % 100 mL IVPB  Status:  Discontinued        1 g 200 mL/hr over 30 Minutes Intravenous Every 12 hours 06/08/20 0845 06/09/20 0922   06/07/20 2200  azithromycin (ZITHROMAX) 500 mg in sodium chloride 0.9 % 250 mL IVPB        500 mg 250 mL/hr over 60 Minutes Intravenous Every 24 hours 06/07/20 1441 06/11/20 2211   06/07/20 1600  ceFEPIme (MAXIPIME) 2 g in sodium chloride 0.9 % 100 mL IVPB  Status:  Discontinued        2 g 200 mL/hr over 30 Minutes Intravenous Every 8 hours 06/07/20  1441 06/07/20 1442   06/07/20 1600  meropenem (MERREM) 1 g in sodium chloride 0.9 % 100 mL IVPB  Status:  Discontinued        1 g 200 mL/hr over 30 Minutes Intravenous Every 8 hours 06/07/20 1442 06/08/20 0845   06/06/20 1630  vancomycin (VANCOREADY) IVPB 1500 mg/300 mL  Status:  Discontinued        1,500 mg 150 mL/hr over 120 Minutes Intravenous  Once 06/06/20 1533 06/06/20 1632   06/05/20 0600  cefTRIAXone (ROCEPHIN) 2 g in sodium chloride 0.9 % 100 mL IVPB  Status:  Discontinued        2 g 200 mL/hr over 30 Minutes Intravenous Every 24 hours 06/04/20 2029 06/07/20 1439   06/04/20 2200  azithromycin (ZITHROMAX) 500 mg in sodium chloride 0.9 % 250 mL IVPB        500 mg 250 mL/hr over 60 Minutes Intravenous Every 24 hours 06/04/20 2029 06/07/20 0003   06/04/20 1915  ceFEPIme (MAXIPIME) 2 g in sodium chloride 0.9 % 100 mL IVPB        2 g 200 mL/hr over 30 Minutes Intravenous  Once 06/04/20 1906 06/04/20 2055   06/04/20 1915  metroNIDAZOLE (FLAGYL) IVPB 500 mg        500 mg 100 mL/hr over 60 Minutes Intravenous  Once 06/04/20 1906 06/04/20 2055   06/04/20 1915  vancomycin (VANCOCIN) IVPB 1000 mg/200 mL premix  Status:  Discontinued        1,000 mg 200 mL/hr over 60 Minutes Intravenous  Once 06/04/20 1906 06/04/20 1912   06/04/20 1915  vancomycin (VANCOREADY) IVPB 1750 mg/350 mL        1,750 mg 175 mL/hr over 120 Minutes Intravenous  Once 06/04/20 1912 06/04/20 2257      Culture/Microbiology    Component Value Date/Time   SDES BLOOD RIGHT HAND 06/28/2020 1705   SDES BLOOD LEFT ANTECUBITAL 06/28/2020 1705   SPECREQUEST  06/28/2020 1705    BOTTLES DRAWN AEROBIC AND ANAEROBIC Blood Culture results may not be optimal due to an excessive volume of blood received in culture bottles   SPECREQUEST  06/28/2020 1705    BOTTLES DRAWN AEROBIC AND ANAEROBIC Blood Culture adequate volume   CULT  06/28/2020 1705    NO GROWTH 5 DAYS Performed at [facility], [address]  [place], [place] [address]    CULT  06/28/2020 1705    NO GROWTH 5 DAYS Performed at [facility], [address] [place], [place] [address]    REPTSTATUS 07/03/2020 FINAL 06/28/2020 1705   REPTSTATUS 07/03/2020 FINAL 06/28/2020 1705    Other culture-see note  Objective: Vitals: Today's Vitals   07/13/20 0500 07/13/20 0523 07/13/20 0826 07/13/20 1250  BP:  (!) 155/84 (!) 146/79 (!) 158/74  Pulse:  100 98 100  Resp:  18 16 16   Temp:  97.9 F (36.6 C) 98.1  F (36.7 C) 98.2 F (36.8 C)  TempSrc:  Oral  Oral  SpO2:  98% 96% 96%  Weight: 79 kg     Height:      PainSc:        Intake/Output Summary (Last 24 hours) at 07/13/2020 1432 Last data filed at 07/13/2020 1044 Gross per 24 hour  Intake 120 ml  Output 4100 ml  Net -3980 ml    Filed Weights   07/11/20 0300 07/12/20 0435 07/13/20 0500  Weight: 78.5 kg 80.3 kg 79 kg   Weight change: -1.3 kg  Intake/Output from previous day: 07/08 0701 - 07/09 0700 In: 360 [P.O.:360] Out: 4100 [Urine:4100] Intake/Output this shift: Total I/O In: 120 [P.O.:120] Out: -  Filed Weights   07/11/20 0300 07/12/20 0435 07/13/20 0500  Weight: 78.5 kg 80.3 kg 79 kg    Examination: General exam: AA, chronically ill appearing, NAD HEENT:PERRL, EOMI Respiratory system: bilaterally diminished, no use of accessory muscle, non tender. Cardiovascular system: S1 & S2 +, No murmur, No JVD. Gastrointestinal system: Abdomen soft, NT,ND, G Tube in place. Nervous System: No acute focal deficits Extremities: No edema, distal peripheral pulses palpable, offloading boots in place. GU: phallus with subcoronal hypospadias and patulous meatus.  14 Fr Foley in place. Skin: No rashes,no icterus. MSK: physical deconditioning.  Data Reviewed: I have personally reviewed following labs and imaging studies CBC: Recent Labs  Lab 07/08/20 0609 07/12/20 0500  WBC 7.9 10.0  HGB 8.7* 8.4*  HCT 25.6* 25.6*  MCV 88.3 89.8  PLT 241 326    Basic  Metabolic Panel: Recent Labs  Lab 07/07/20 0914 07/08/20 0609 07/09/20 0602 07/10/20 0929 07/11/20 0851 07/12/20 0500 07/13/20 0607  NA 141 140 140 144 137 136 134*  K 3.8 3.3* 3.8 4.1 3.8 3.3* 3.8  CL 106 104 105 106 98 100 97*  CO2 26 27 28 29 30 29 29   GLUCOSE 168* 162* 183* 137* 154* 136* 173*  BUN 134* 119* 118* 109* 104* 108* 106*  CREATININE 3.30* 3.30* 3.23* 3.10* 2.99* 2.79* 2.75*  CALCIUM 10.2 10.0 10.8* 11.5* 10.9* 10.7* 11.0*  MG 2.3  --   --   --   --   --   --   PHOS  --  4.2  --   --   --   --   --      CBG: Recent Labs  Lab 07/13/20 0027 07/13/20 0527 07/13/20 0947 07/13/20 1155 07/13/20 1410  GLUCAP 170* 173* 133* 188* 128*      Radiology Studies: DG Abd 1 View  Result Date: 07/13/2020 CLINICAL DATA:  Altered mental status.  Nausea EXAM: ABDOMEN - 1 VIEW COMPARISON:  None. FINDINGS: Gastrostomy tube projects over the stomach. Nonobstructive bowel gas pattern. No organomegaly or free air. IMPRESSION: No acute findings. Electronically Signed   By: Rolm Baptise M.D.   On: 07/13/2020 12:22     LOS: 23 days   George Hugh, MD [facility]  07/13/2020, 2:32 PM

## 2020-07-13 NOTE — Progress Notes (Signed)
Physical Therapy Treatment Patient Details Name: Wyatt Ball. MRN: [id number] DOB: 12/18/1960 Today's Date: 07/13/2020    History of Present Illness Wyatt Ball. is a 60 y.o. 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  Since hospitalization pt has been intubated, finally extubated 6/15.    PT Comments    Patient more alert at start of session asking for a sprite. He initially was able to activate LE with AROM in supine. However after 3-4 repetitions, patient required AAROM to continue. He often said, "Wait a minute, I need a minute." Patient became more fatigued as session continued. Instructed patient in bed mobility to assist nursing with dressing/linen change. Patient was so fatigued after rolling that he requested therapy to stop and come back another time that "I need a nap". Patient would benefit from additional skilled PT Intervention to improve strength and mobility;    Follow Up Recommendations  SNF;Other (comment) (Long term care)     Equipment Recommendations   (TBD in next venue)    Recommendations for Other Services       Precautions / Restrictions Precautions Precautions: Fall Restrictions Weight Bearing Restrictions: No    Mobility  Bed Mobility Overal bed mobility: Needs Assistance Bed Mobility: Rolling Rolling: Max assist;+2 for physical assistance         General bed mobility comments: patient inconsistent with rolling; he rolled right/left for dressing/bedding change. He was dependent +2 for 1 rolling and then was mod-max A +2 for other rolling; requires increased time and mod VCs for proper hand placement  and positioning; unable to work on supine to sit due to fatigue    Transfers                    Ambulation/Gait                 Stairs             Wheelchair Mobility    Modified Rankin (Stroke Patients Only)       Balance       Sitting balance - Comments: deferred due to fatigue       Standing balance comment: unable to attempt                            Cognition Arousal/Alertness: Awake/alert Behavior During Therapy: WFL for tasks assessed/performed Overall Cognitive Status: No family/caregiver present to determine baseline cognitive functioning                                 General Comments: Able to respond to name/states Date of birth      Exercises Other Exercises Other Exercises: Instructed patient in LE exercise: ankle pump x10, heel slides x10 bilaterally; Patient initially able to do AROM but then after 3-4 reps required AAROM due to fatigue    General Comments        Pertinent Vitals/Pain Pain Assessment: No/denies pain    Home Living  Prior Function            PT Goals (current goals can now be found in the care plan section) Acute Rehab PT Goals Patient Stated Goal: To drink a diet coke. Time For Goal Achievement: 07/25/20 Progress towards PT goals: Progressing toward goals (pt initially more alert this session)    Frequency    Min 2X/week      PT Plan Current plan remains appropriate    Co-evaluation              AM-PAC PT "6 Clicks" Mobility   Outcome Measure  Help needed turning from your back to your side while in a flat bed without using bedrails?: A Lot Help needed moving from lying on your back to sitting on the side of a flat bed without using bedrails?: Total Help needed moving to and from a bed to a chair (including a wheelchair)?: Total Help needed standing up from a chair using your arms (e.g., wheelchair or bedside chair)?:  Total Help needed to walk in hospital room?: Total Help needed climbing 3-5 steps with a railing? : Total 6 Click Score: 7    End of Session   Activity Tolerance: Patient limited by fatigue Patient left: in bed;with nursing/sitter in room   PT Visit Diagnosis: Muscle weakness (generalized) (M62.81);Adult, failure to thrive (R62.7);Pain     Time: 1133-1205 PT Time Calculation (min) (ACUTE ONLY): 32 min  Charges:  $Therapeutic Exercise: 8-22 mins $Therapeutic Activity: 8-22 mins                        Future [place] PT, DPT 07/13/2020, 12:58 PM

## 2020-07-14 DIAGNOSIS — J189 Pneumonia, unspecified organism: Secondary | ICD-10-CM | POA: Diagnosis not present

## 2020-07-14 LAB — GLUCOSE, CAPILLARY
Glucose-Capillary: 193 mg/dL — ABNORMAL HIGH (ref 70–99)
Glucose-Capillary: 197 mg/dL — ABNORMAL HIGH (ref 70–99)
Glucose-Capillary: 127 mg/dL — ABNORMAL HIGH (ref 70–99)
Glucose-Capillary: 153 mg/dL — ABNORMAL HIGH (ref 70–99)
Glucose-Capillary: 151 mg/dL — ABNORMAL HIGH (ref 70–99)

## 2020-07-14 LAB — BASIC METABOLIC PANEL
Anion gap: 6 (ref 5–15)
BUN: 113 mg/dL — ABNORMAL HIGH (ref 8–23)
CO2: 31 mmol/L (ref 22–32)
Calcium: 11.6 mg/dL — ABNORMAL HIGH (ref 8.9–10.3)
Chloride: 100 mmol/L (ref 98–111)
Creatinine, Ser: 2.85 mg/dL — ABNORMAL HIGH (ref 0.61–1.24)
GFR, Estimated: 24 mL/min — ABNORMAL LOW (ref >60)
Glucose, Bld: 154 mg/dL — ABNORMAL HIGH (ref 70–99)
Potassium: 3.9 mmol/L (ref 3.5–5.1)
Sodium: 137 mmol/L (ref 135–145)

## 2020-07-14 MED ORDER — AMLODIPINE BESYLATE 10 MG PO TABS
10.0000 mg | ORAL_TABLET | Freq: Every day | ORAL | Status: DC
  Administered 2020-07-15 – 2020-08-02 (×18): 10 mg
  Filled 2020-07-14 (×19): qty 1

## 2020-07-14 MED ORDER — LACTATED RINGERS IV SOLN: INTRAVENOUS | Status: DC

## 2020-07-14 NOTE — Progress Notes (Addendum)
PROGRESS NOTE    Wyatt Ball.  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  CT Scan shows bilateral nodular opacities Left>Right.  COVID and Flu was negative.  UA with 80 ketones and rare bacteria.  Ucx NG.  Scx NG.  Blood Cultures grew 1/4 MSSE.  UDS positive for opiates and TCAs.  He is on multiple pain meds at home.  6/2 Intubated and Bronchoscopy with negative cultures. 6/3 Self Extubated and Re-intubated.   MRI of brain showed no acute abnormality. 6/6 Left IJV 6/7 HD started 6/15 Extubated 6/19 Transferred to [facility] 6/24 Recurrent Fevers, treated with short course of antibiotics. 6/26 Right PICC line placed. 6/27 HD stopped.  Goals of care discussed with palliative team 6/29 EGD and G Tube placed for persistent dysphagia.  Subjective: Patient states his stomach still aches but feels better. He had 1 bowel movement x 24 hours. Abdominal Exam is benign.  Patient denies fevers, shortness of breath, cough, or chest pain. BP is stable. Neuro exam is stable. Patient is conversational.  Vocal cords are ok.  PICC line looks ok. G tube looks ok.  SLP is working with patient.  Tube Feeds are running at 55 CC/hr. Foley is in place.   Assessment & Plan: Principal Problem:   CAP (community acquired pneumonia) Active Problems:   Acute metabolic encephalopathy   Severe sepsis with septic shock (HCC)   Neurogenic  bladder   Chronic, continuous use of opioids   Chronic low back pain   Type 2 diabetes mellitus without complication (HCC)   Migraines   Recurrent UTI   History of colon polyps   Urinary retention   Altered mental status   Pressure injury of skin   AKI (acute kidney injury) ([place])   Malnutrition of moderate degree  Acute Constipation: Abdominal Xray showed mild gaseous distention.  Patient reports he has had 1 BM (not documented). - Continue Senna BID and Amitiza 24 mcg BID. - Monitor bowel movements.  Multifocal Pneumonia, Left> Right: Treated with roughly 14 day course of antibiotics per ID recommendations: - Monitor.  Acute Kidney Injury on CKD, resolved: - Creatinine is ~ 2.8 mg/dL - this is his baseline. - Give gentle fluids and recheck in am. - Avoid nephrotoxic meds and monitor renal function. - Family does not want to pursue further dialysis sessions if needed.  Hypertension: - Increase Amlodipine from 5 to 10 mg daily. - Continue Clonidine patch weekly. - Continue Lopressor at 100 mg BID.  Diabetes Mellitus Type 2: - Glucose is stable.   - Continue Glargine 20 units nightly, Novolog 3 units q4hrs, and SSI  Chronic Pain:  - Gabapentin 100 mg TID.  Nausea:  - Zofran PRN.  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 - Exchange Foley weekly.  Acute Dysphagia s/p 6/29 G tube in place: - SLP is working with patient, appreciate.  Agitation /  PTSD: - Seroquel 12.5 mg BID. - If there is any agitation, we will increase Seroquel to 25 mg BID.  Insomnia: - Trazodone 150 mg HS.  Chronic Opiate Use: - Oxycodone PRN.  Physical Deconditioning: - PT/OT recommends SNF.  Code Status:  - DNR/DNI  Lines: Right PICC line placed on 6/26. Tube: G tube placed on 6/29. Foley: Foley exchanged on 7/8.  Exchange weekly.  Diet Order             DIET - DYS 1 Room service appropriate? Yes;  Fluid consistency: Thin  Diet effective now                   Nutrition Problem: Moderate Malnutrition (in the context of chronic illness) Etiology: inability to eat Signs/Symptoms: mild fat depletion, mild muscle depletion, moderate muscle depletion Interventions: Tube feeding, Juven Patient's Body mass index is 28.98 kg/m.   Pressure Injury 06/23/20 Coccyx Posterior;Mid Stage 2 -  Partial thickness loss of dermis presenting as a shallow open injury with a red, pink wound bed without slough. (Active)  06/23/20 1400  Location: Coccyx  Location Orientation: Posterior;Mid  Staging: Stage 2 -  Partial thickness loss of dermis presenting as a shallow open injury with a red, pink wound bed without slough.  Wound Description (Comments):   Present on Admission: Yes    DVT prophylaxis: heparin injection 5,000 Units Start: 06/10/20 2200 Code Status:   Code Status: DNR  Family Communication: plan of care discussed with patient at bedside.  Status is: Inpatient  Remains inpatient appropriate because:Inpatient level of care appropriate due to severity of illness  Dispo: The patient is from: Home              Anticipated d/c is to: Discharge is pending SNF placement with the [facility].              Patient currently is not medically stable to d/c.   Difficult to place patient No    Unresulted Labs (From admission, onward)     Start     Ordered   07/12/20 8638  Basic metabolic panel  Daily,   R     Question:  Specimen collection method  Answer:  Unit=Unit collect   07/11/20 2132   06/06/20 1819  Pneumocystis smear by DFA  ONCE - STAT,   STAT       Question:  Patient immune status  Answer:  Immunocompromised   06/06/20 1820   06/06/20 1818  Acid Fast Culture with reflexed sensitivities  (AFB smear + Culture w reflexed sensitivities panel)  Once,   R       Question:  Patient immune status  Answer:  Immunocompromised  See Hyperspace for full Linked Orders Report.   06/06/20 1820   Signed  and Held  Phosphorus  Once,   R       Question:  Specimen collection method  Answer:  Unit=Unit collect   Signed and Held   Signed and Held  Phosphorus  Once,   R       Question:  Specimen collection method  Answer:  Unit=Unit collect   Signed and Held   Signed and Held  Phosphorus  Once,   R       Question:  Specimen collection method  Answer:  Unit=Unit collect   Signed and Held             Medications reviewed:  Scheduled Meds:  amLODipine  5 mg Per Tube Daily  chlorhexidine gluconate (MEDLINE KIT)  15 mL Mouth Rinse BID   Chlorhexidine Gluconate Cloth  6 each Topical Daily   cloNIDine  0.3 mg Transdermal Weekly   feeding supplement (PROSource TF)  45 mL Per Tube Daily   free water  150 mL Per Tube Q4H   gabapentin  100 mg Per Tube Q8H   heparin injection (subcutaneous)  5,000 Units Subcutaneous Q8H   insulin aspart  0-20 Units Subcutaneous Q4H   insulin aspart  3 Units Subcutaneous Q4H   insulin glargine  20 Units Subcutaneous Daily   lubiprostone  24 mcg Oral BID WC   metoprolol tartrate  100 mg Per Tube BID   multivitamin  1 tablet Per Tube QHS   nutrition supplement (JUVEN)  1 packet Per Tube BID   pantoprazole sodium  40 mg Per Tube Daily   polyethylene glycol  17 g Oral Daily   prazosin  4 mg Per Tube QHS   QUEtiapine  12.5 mg Per Tube BID   senna-docusate  2 tablet Oral Daily   sodium chloride flush  10-40 mL Intracatheter Q12H   traZODone  150 mg Per Tube QHS   Continuous Infusions:  sodium chloride Stopped (06/22/20 1614)   feeding supplement (NEPRO CARB STEADY) 1,000 mL (07/12/20 1411)   lactated ringers 100 mL/hr at 07/14/20 1130    Consultants:see note  Procedures:see note  Antimicrobials: Anti-infectives (From admission, onward)    Start     Dose/Rate Route Frequency Ordered Stop   06/28/20 1815  vancomycin (VANCOREADY) IVPB 1750 mg/350 mL        1,750 mg 175 mL/hr over 120 Minutes Intravenous  Once 06/28/20 1726 06/28/20 2054   06/28/20 1815   meropenem (MERREM) 1 g in sodium chloride 0.9 % 100 mL IVPB  Status:  Discontinued        1 g 200 mL/hr over 30 Minutes Intravenous Every 12 hours 06/28/20 1726 07/01/20 1558   06/28/20 1742  vancomycin variable dose per unstable renal function (pharmacist dosing)  Status:  Discontinued         Does not apply See admin instructions 06/28/20 1742 06/30/20 0757   06/26/20 2100  fluconazole (DIFLUCAN) IVPB 50 mg       See Hyperspace for full Linked Orders Report.   50 mg 25 mL/hr over 60 Minutes Intravenous Every 24 hours 06/25/20 1835 06/30/20 2118   06/25/20 2000  fluconazole (DIFLUCAN) IVPB 200 mg       See Hyperspace for full Linked Orders Report.   200 mg 100 mL/hr over 60 Minutes Intravenous  Once 06/25/20 1835 06/25/20 2348   06/14/20 1800  levofloxacin (LEVAQUIN) IVPB 500 mg        500 mg 100 mL/hr over 60 Minutes Intravenous Every 48 hours 06/12/20 1624 06/19/20 0210   06/12/20 1800  levofloxacin (LEVAQUIN) IVPB 750 mg        750 mg 100 mL/hr over 90 Minutes Intravenous  Once 06/11/20 2239 06/12/20 1904   06/11/20 1800  meropenem (MERREM) 500 mg in sodium chloride 0.9 % 100 mL IVPB  Status:  Discontinued        500 mg 200 mL/hr over 30 Minutes Intravenous Every 24 hours 06/10/20 1444 06/11/20 2231   06/10/20 1300  doxycycline (VIBRAMYCIN) 100 mg in sodium chloride 0.9 % 250 mL IVPB  Status:  Discontinued        100 mg 125 mL/hr over 120 Minutes Intravenous Every 12 hours 06/10/20 1149 06/17/20 1704   06/09/20 1015  meropenem (MERREM) 500 mg in sodium chloride 0.9 % 100 mL IVPB        500 mg 200 mL/hr over 30 Minutes Intravenous Every 12 hours 06/09/20 0922 06/11/20 1203   06/08/20 2200  meropenem (MERREM) 1 g in sodium chloride 0.9 % 100 mL IVPB  Status:  Discontinued        1 g 200 mL/hr over 30 Minutes Intravenous Every 12 hours 06/08/20 0845 06/09/20 0922   06/07/20 2200  azithromycin (ZITHROMAX) 500 mg in sodium chloride 0.9 % 250 mL IVPB        500 mg 250 mL/hr over 60  Minutes Intravenous Every 24 hours 06/07/20 1441 06/11/20 2211   06/07/20 1600  ceFEPIme (MAXIPIME) 2 g in sodium chloride 0.9 % 100 mL IVPB  Status:  Discontinued        2 g 200 mL/hr over 30 Minutes Intravenous Every 8 hours 06/07/20 1441 06/07/20 1442   06/07/20 1600  meropenem (MERREM) 1 g in sodium chloride 0.9 % 100 mL IVPB  Status:  Discontinued        1 g 200 mL/hr over 30 Minutes Intravenous Every 8 hours 06/07/20 1442 06/08/20 0845   06/06/20 1630  vancomycin (VANCOREADY) IVPB 1500 mg/300 mL  Status:  Discontinued        1,500 mg 150 mL/hr over 120 Minutes Intravenous  Once 06/06/20 1533 06/06/20 1632   06/05/20 0600  cefTRIAXone (ROCEPHIN) 2 g in sodium chloride 0.9 % 100 mL IVPB  Status:  Discontinued        2 g 200 mL/hr over 30 Minutes Intravenous Every 24 hours 06/04/20 2029 06/07/20 1439   06/04/20 2200  azithromycin (ZITHROMAX) 500 mg in sodium chloride 0.9 % 250 mL IVPB        500 mg 250 mL/hr over 60 Minutes Intravenous Every 24 hours 06/04/20 2029 06/07/20 0003   06/04/20 1915  ceFEPIme (MAXIPIME) 2 g in sodium chloride 0.9 % 100 mL IVPB        2 g 200 mL/hr over 30 Minutes Intravenous  Once 06/04/20 1906 06/04/20 2055   06/04/20 1915  metroNIDAZOLE (FLAGYL) IVPB 500 mg        500 mg 100 mL/hr over 60 Minutes Intravenous  Once 06/04/20 1906 06/04/20 2055   06/04/20 1915  vancomycin (VANCOCIN) IVPB 1000 mg/200 mL premix  Status:  Discontinued        1,000 mg 200 mL/hr over 60 Minutes Intravenous  Once 06/04/20 1906 06/04/20 1912   06/04/20 1915  vancomycin (VANCOREADY) IVPB 1750 mg/350 mL        1,750 mg 175 mL/hr over 120 Minutes Intravenous  Once 06/04/20 1912 06/04/20 2257      Culture/Microbiology    Component Value Date/Time   SDES BLOOD RIGHT HAND 06/28/2020 1705   SDES BLOOD LEFT ANTECUBITAL 06/28/2020 1705   SPECREQUEST  06/28/2020 1705    BOTTLES DRAWN AEROBIC AND ANAEROBIC Blood Culture results may not be optimal due to an excessive volume of blood  received in culture bottles   SPECREQUEST  06/28/2020 1705    BOTTLES DRAWN AEROBIC AND ANAEROBIC Blood Culture adequate volume   CULT  06/28/2020 1705    NO GROWTH 5 DAYS Performed at [facility], [address] [place], [place] [address]    CULT  06/28/2020 1705    NO GROWTH 5 DAYS Performed at [facility], [address] [place], [place] [address]    REPTSTATUS 07/03/2020 FINAL 06/28/2020 1705   REPTSTATUS  07/03/2020 FINAL 06/28/2020 1705    Other culture-see note  Objective: Vitals: Today's Vitals   07/14/20 0052 07/14/20 0426 07/14/20 0743 07/14/20 1220  BP: (!) 146/69 (!) 152/68 (!) 158/72 129/74  Pulse: 88 85 (!) 102 76  Resp: _0 Temp: 97.8 F (36.6 C) 98.8 F (37.1 C) 97.6 F (36.4 C) 97.6 F (36.4 C)  TempSrc:  Oral  Oral  SpO2: 98% 96% 100% 94%  Weight:  81.4 kg    Height:      PainSc:        Intake/Output Summary (Last 24 hours) at 07/14/2020 1338 Last data filed at 07/14/2020 1000 Gross per 24 hour  Intake 120 ml  Output 1375 ml  Net -1255 ml    Filed Weights   07/12/20 0435 07/13/20 0500 07/14/20 0426  Weight: 80.3 kg 79 kg 81.4 kg   Weight change: 2.4 kg  Intake/Output from previous day: 07/09 0701 - 07/10 0700 In: 240 [P.O.:240] Out: 1375 [Urine:1375] Intake/Output this shift: No intake/output data recorded. Filed Weights   07/12/20 0435 07/13/20 0500 07/14/20 0426  Weight: 80.3 kg 79 kg 81.4 kg    Examination: General exam: AA, chronically ill appearing, NAD HEENT:PERRL, EOMI Respiratory system: bilaterally diminished, no use of accessory muscle, non tender. Cardiovascular system: S1 & S2 +, No murmur, No JVD. Gastrointestinal system: Abdomen soft, NT,ND, G Tube in place. Nervous System: No acute focal deficits Extremities: No edema, distal peripheral pulses palpable, offloading boots in place. GU: phallus with subcoronal hypospadias and patulous meatus.  14 Fr Foley in place. Skin: No rashes,no  icterus. MSK: physical deconditioning.  Data Reviewed: I have personally reviewed following labs and imaging studies CBC: Recent Labs  Lab 07/08/20 0609 07/12/20 0500  WBC 7.9 10.0  HGB 8.7* 8.4*  HCT 25.6* 25.6*  MCV 88.3 89.8  PLT 241 203    Basic Metabolic Panel: Recent Labs  Lab 07/08/20 0609 07/09/20 0602 07/10/20 0929 07/11/20 0851 07/12/20 0500 07/13/20 0607 07/14/20 0654  NA 140   < > 144 137 136 134* 137  K 3.3*   < > 4.1 3.8 3.3* 3.8 3.9  CL 104   < > 106 98 100 97* 100  CO2 27   < > _1 GLUCOSE 162*   < > 137* 154* 136* 173* 154*  BUN 119*   < > 109* 104* 108* 106* 113*  CREATININE 3.30*   < > 3.10* 2.99* 2.79* 2.75* 2.85*  CALCIUM 10.0   < > 11.5* 10.9* 10.7* 11.0* 11.6*  PHOS 4.2  --   --   --   --   --   --    < > = values in this interval not displayed.     CBG: Recent Labs  Lab 07/13/20 1813 07/13/20 2020 07/14/20 0430 07/14/20 0842 07/14/20 1222  GLUCAP 184* 170* 193* 197* 127*      Radiology Studies: DG Abd 1 View  Result Date: 07/13/2020 CLINICAL DATA:  Altered mental status.  Nausea EXAM: ABDOMEN - 1 VIEW COMPARISON:  None. FINDINGS: Gastrostomy tube projects over the stomach. Nonobstructive bowel gas pattern. No organomegaly or free air. IMPRESSION: No acute findings. Electronically Signed   By: Rolm Baptise M.D.   On: 07/13/2020 12:22     LOS: 70 days   George Hugh, MD [facility]  07/14/2020, 1:38 PM

## 2020-07-15 DIAGNOSIS — J189 Pneumonia, unspecified organism: Secondary | ICD-10-CM | POA: Diagnosis not present

## 2020-07-15 LAB — BASIC METABOLIC PANEL
Anion gap: 8 (ref 5–15)
BUN: 88 mg/dL — ABNORMAL HIGH (ref 8–23)
CO2: 29 mmol/L (ref 22–32)
Calcium: 11 mg/dL — ABNORMAL HIGH (ref 8.9–10.3)
Chloride: 97 mmol/L — ABNORMAL LOW (ref 98–111)
Creatinine, Ser: 2.89 mg/dL — ABNORMAL HIGH (ref 0.61–1.24)
GFR, Estimated: 24 mL/min — ABNORMAL LOW (ref >60)
Glucose, Bld: 188 mg/dL — ABNORMAL HIGH (ref 70–99)
Potassium: 3.9 mmol/L (ref 3.5–5.1)
Sodium: 134 mmol/L — ABNORMAL LOW (ref 135–145)

## 2020-07-15 LAB — GLUCOSE, CAPILLARY
Glucose-Capillary: 110 mg/dL — ABNORMAL HIGH (ref 70–99)
Glucose-Capillary: 123 mg/dL — ABNORMAL HIGH (ref 70–99)
Glucose-Capillary: 141 mg/dL — ABNORMAL HIGH (ref 70–99)
Glucose-Capillary: 148 mg/dL — ABNORMAL HIGH (ref 70–99)
Glucose-Capillary: 158 mg/dL — ABNORMAL HIGH (ref 70–99)
Glucose-Capillary: 175 mg/dL — ABNORMAL HIGH (ref 70–99)
Glucose-Capillary: 181 mg/dL — ABNORMAL HIGH (ref 70–99)

## 2020-07-15 NOTE — Progress Notes (Signed)
Pt had compliant of feeling like he would bust. Felt he had to have bowel movement. Pt assessed and noted stat lock was near knee. Pt had been caught holding foley in his hands. Bladder scan completed. Foley balloon deflated and re-inflated with 12ml. Pt had immediate copious amounts of urine. Pt reported relief. Total of 1900 of urine noted.    07/14/20 2131  Output (mL)  Urine 1900 mL  Unmeasured Output  Urine Occurrence 1900  Urine Characteristics  Urinary Incontinence Yes  Urine Color Yellow/straw  Urine Appearance Clear  Urinary Interventions Bladder scan  Bladder Scan Volume (mL) 1194 mL  Hygiene Peri care AND Foley care

## 2020-07-15 NOTE — Progress Notes (Signed)
SLP Cancellation Note  Patient Details Name: Wyatt Ball. MRN: [id number] DOB: 01/30/1960   Cancelled treatment:       Reason Eval/Treat Not Completed: Fatigue/lethargy limiting ability to participate  Pt initially speaking to this Clinical research associate. However, when presented with a calendar to engage in structured task, pt remained with eyes closed. Pt didn't open eyes in response to multi-modal stimulation.   Wilma Michaelson B. Dreama Saa M.S., CCC-SLP, [facility] Speech-Language Pathologist Rehabilitation Services Office [phone]   [name] 07/15/2020, 12:01 PM

## 2020-07-15 NOTE — Progress Notes (Signed)
Occupational Therapy Treatment Patient Details Name: Wyatt Ball. MRN: [id number] DOB: [birth year] Today's Date: 07/15/2020    History of present illness Wyatt Ball. is a 60 y.o. 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  Since hospitalization pt has been intubated, finally extubated 6/15.   OT comments  Pt seen for OT treatment on this date. Upon arrival to room, pt awake, sitting upright in bed, and eating breakfast with RN at bedside. Pt alert and oriented to self (aware that he is in a hospital, but is not aware at [facility]). Pt with improved cognition this date, alert throughout and able to follow 1-step commands consistently to initiate tasks, however requiring MOD verbal cues to attend and continue with tasks. Pt tangential at times however easily re-directable. Pt also presents with decreased strength, balance, and activity tolerance, and requires SET-UP A to drink from cup via straw, MIN A to wash face and brush hair, MOD A to wash UB, MOD A for UB dressing, and MAX A+2 for supine>sit transfers. With b/l UE supported, pt able to sit EOB for 2 mins, requiring intermittent MOD A to maintain midline. Pt making good progress toward goals. Pt continues to benefit from skilled OT services to maximize return to PLOF and minimize risk of future falls, injury, caregiver burden, and readmission. Will continue to follow POC. Discharge recommendation remains appropriate.     Follow Up Recommendations  SNF    Equipment Recommendations  Other (comment) (defer to next venue of care)    Recommendations for Other Services      Precautions / Restrictions  Precautions Precautions: Fall Restrictions Weight Bearing Restrictions: No       Mobility Bed Mobility Overal bed mobility: Needs Assistance Bed Mobility: Supine to Sit;Sit to Supine     Supine to sit: Max assist;+2 for physical assistance Sit to supine: Total assist;+2 for physical assistance   General bed mobility comments: Pt able to bring LE to side of bed, however demonstrating difficulty sequecing task and requiring MAX-TOTAL A to complete transfer.    Transfers                 General transfer comment: unsafe to attempt due to poor sitting balance    Balance Overall balance assessment: Needs assistance Sitting-balance support: Bilateral upper extremity supported Sitting balance-Leahy Scale: Poor Sitting balance - Comments: With b/l UE supported, pt able to sit EOB for 2 mins, requiring intermittent MOD A to maintain midline Postural control: Right lateral lean     Standing balance comment: unable to attempt                           ADL either performed or assessed with clinical judgement   ADL Overall ADL's : Needs assistance/impaired Eating/Feeding: Set up;Bed level Eating/Feeding Details (indicate cue type and reason): Pt able to bring cup to mouth and drink from straw without spillage while in Fowler's position Grooming: Wash/dry face;Brushing hair;Minimal assistance;Wash/dry hands;Set up;Bed level Grooming Details (indicate cue type and reason): Pt using LUE to assist in bringing RUE to head to brush R side of head d/t decreased active shoulder flexion in RUE. MIN  A required to support proximal joints in LUE to reach back of head while brushing hair. MIN A for thorougness in washing face. Upper Body Bathing: Moderate assistance;Bed level Upper Body Bathing Details (indicate cue type and reason): Pt able to wash hands/forearms following set-up assist and MOD verbal cues for maintaining attention to task. MOD A required to wash UE above elbows d/t  weakness and decreased ROM of shoulders     Upper Body Dressing : Moderate assistance;Bed level Upper Body Dressing Details (indicate cue type and reason): to don/doff hospital gown Lower Body Dressing: Maximal assistance;Bed level Lower Body Dressing Details (indicate cue type and reason): MAX A to don prevalon boots                     Vision       Perception     Praxis      Cognition Arousal/Alertness: Awake/alert Behavior During Therapy: WFL for tasks assessed/performed Overall Cognitive Status: No family/caregiver present to determine baseline cognitive functioning                                 General Comments: Pt with improved cognition this date, alert throughout and able to follow 1-step commands consistently to initiate tasks, however requiring MOD verbal cues to attend and continue with task. Pt tangential at times however easily re-directable              General Comments      Pertinent Vitals/ Pain       Pain Location: chronic back pain Pain Descriptors / Indicators: Aching Pain Intervention(s): Limited activity within patient's tolerance;Monitored during session;Repositioned         Frequency  Min 1X/week        Progress Toward Goals  OT Goals(current goals can now be found in the care plan section)  Progress towards OT goals: Progressing toward goals  Acute Rehab OT Goals Patient Stated Goal: To sit OT Goal Formulation: With patient Time For Goal Achievement: 07/19/20 Potential to Achieve Goals: Fair  Plan Frequency remains appropriate;Discharge plan remains appropriate       AM-PAC OT "6 Clicks" Daily Activity     Outcome Measure   Help from another person eating meals?: A Lot Help from another person taking care of personal grooming?: A Little Help from another person toileting, which includes using toliet, bedpan, or urinal?: Total Help from another person bathing (including washing, rinsing, drying)?: A  Lot Help from another person to put on and taking off regular upper body clothing?: A Little Help from another person to put on and taking off regular lower body clothing?: A Lot 6 Click Score: 13    End of Session    OT Visit Diagnosis: Other symptoms and signs involving cognitive function   Activity Tolerance Patient tolerated treatment well   Patient Left in bed;with call bell/phone within reach;with bed alarm set   Nurse Communication Mobility status        Time: 0930-1008 OT Time Calculation (min): 38 min  Charges: OT General Charges $OT Visit: 1 Visit OT Treatments $Self Care/Home Management : 38-52 mins  Matthew Folks, OTR/L [facility] [phone]

## 2020-07-15 NOTE — Progress Notes (Signed)
PROGRESS NOTE    Wyatt Ball [employer].  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  CT Scan shows bilateral nodular opacities Left>Right.  COVID and Flu was negative.  UA with 80 ketones and rare bacteria.  Ucx NG.  Scx NG.  Blood Cultures grew 1/4 MSSE.  UDS positive for opiates and TCAs.  He is on multiple pain meds at home.  6/2 Intubated and Bronchoscopy with negative cultures. 6/3 Self Extubated and Re-intubated.   MRI of brain showed no acute abnormality. 6/6 Left IJV 6/7 HD started 6/15 Extubated 6/19 Transferred to [facility] 6/24 Recurrent Fevers, treated with short course of antibiotics. 6/26 Right PICC line placed. 6/27 HD stopped.  Goals of care discussed with palliative team 6/29 EGD and G Tube placed for persistent dysphagia.  Subjective: Wyatt Ball had multiple bowel movements. He states his stomach feels better but still aches. Abdominal exam is benign.  Patient denies fevers, shortness of breath, cough, or chest pain. BP is stable. Neuro exam is stable. Patient carries conversation fine.  Vocal cords ok.  Mental status is stable.  PICC line looks ok. G tube looks ok.  SLP is working with patient.  Tube Feeds are running at 55 CC/hr. Foley is in place.   Assessment & Plan: Principal Problem:   CAP (community acquired pneumonia) Active Problems:   Acute metabolic encephalopathy   Severe sepsis with  septic shock (HCC)   Neurogenic bladder   Chronic, continuous use of opioids   Chronic low back pain   Type 2 diabetes mellitus without complication (HCC)   Migraines   Recurrent UTI   History of colon polyps   Urinary retention   Altered mental status   Pressure injury of skin   AKI (acute kidney injury) ([place])   Malnutrition of moderate degree  Acute Constipation: Abdominal Xray showed mild gaseous distention.  Patient has had multiple bowel movements. - Discontinue bowel regimen.  Multifocal Pneumonia, Left> Right: Treated with roughly 14 day course of antibiotics per ID recommendations: - Monitor.  Acute Kidney Injury on CKD, resolved: - Creatinine is ~ 2.8 mg/dL - this is his baseline. - Hold off on anymore fluids. - Avoid nephrotoxic meds and monitor renal function. - Family does not want to pursue further dialysis sessions if needed.  Hypertension: - Continue Amlodipine at 10 mg daily. - Continue Clonidine patch weekly. - Continue Lopressor at 100 mg BID.  Diabetes Mellitus Type 2: - Glucose is stable.   - Continue Glargine 20 units nightly, Novolog 3 units q4hrs, and SSI  Chronic Pain:  - Gabapentin 100 mg TID.  Nausea:  - Zofran PRN.  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 - Exchange Foley weekly.  Acute Dysphagia s/p 6/29 G tube in place: - SLP is working with patient, appreciate.  Agitation / PTSD: - Seroquel 12.5 mg BID. - If there is any agitation,  we will increase Seroquel to 25 mg BID.  Insomnia: - Trazodone 150 mg HS.  Chronic Opiate Use: - Oxycodone PRN.  Physical Deconditioning: - PT/OT recommends SNF.  Code Status:  - DNR/DNI  Lines: Right PICC line placed on 6/26. Tube: G tube placed on 6/29. Foley: Foley exchanged on 7/8.  Exchange weekly.  Diet Order             DIET - DYS 1 Room service appropriate? Yes; Fluid consistency: Thin  Diet  effective now                   Nutrition Problem: Moderate Malnutrition (in the context of chronic illness) Etiology: inability to eat Signs/Symptoms: mild fat depletion, mild muscle depletion, moderate muscle depletion Interventions: Tube feeding, Juven Patient's Body mass index is 29.09 kg/m.   Pressure Injury 06/23/20 Coccyx Posterior;Mid Stage 2 -  Partial thickness loss of dermis presenting as a shallow open injury with a red, pink wound bed without slough. (Active)  06/23/20 1400  Location: Coccyx  Location Orientation: Posterior;Mid  Staging: Stage 2 -  Partial thickness loss of dermis presenting as a shallow open injury with a red, pink wound bed without slough.  Wound Description (Comments):   Present on Admission: Yes    DVT prophylaxis: heparin injection 5,000 Units Start: 06/10/20 2200 Code Status:   Code Status: DNR  Family Communication: plan of care discussed with patient at bedside.  Status is: Inpatient  Remains inpatient appropriate because:Inpatient level of care appropriate due to severity of illness  Dispo: The patient is from: Home              Anticipated d/c is to: Discharge is pending SNF placement with the [facility].              Patient currently is not medically stable to d/c.   Difficult to place patient No    Unresulted Labs (From admission, onward)     Start     Ordered   07/12/20 2505  Basic metabolic panel  Daily,   R     Question:  Specimen collection method  Answer:  Unit=Unit collect   07/11/20 2132   06/06/20 1819  Pneumocystis smear by DFA  ONCE - STAT,   STAT       Question:  Patient immune status  Answer:  Immunocompromised   06/06/20 1820   06/06/20 1818  Acid Fast Culture with reflexed sensitivities  (AFB smear + Culture w reflexed sensitivities panel)  Once,   R       Question:  Patient immune status  Answer:  Immunocompromised  See Hyperspace for full Linked Orders Report.   06/06/20 1820   Signed and Held  Phosphorus  Once,    R       Question:  Specimen collection method  Answer:  Unit=Unit collect   Signed and Held   Signed and Held  Phosphorus  Once,   R       Question:  Specimen collection method  Answer:  Unit=Unit collect   Signed and Held   Signed and Held  Phosphorus  Once,   R       Question:  Specimen collection method  Answer:  Unit=Unit collect   Signed and Held             Medications reviewed:  Scheduled Meds:  amLODipine  10 mg Per Tube Daily   chlorhexidine gluconate (MEDLINE KIT)  15 mL Mouth Rinse BID  Chlorhexidine Gluconate Cloth  6 each Topical Daily   cloNIDine  0.3 mg Transdermal Weekly   feeding supplement (PROSource TF)  45 mL Per Tube Daily   free water  150 mL Per Tube Q4H   gabapentin  100 mg Per Tube Q8H   heparin injection (subcutaneous)  5,000 Units Subcutaneous Q8H   insulin aspart  0-20 Units Subcutaneous Q4H   insulin aspart  3 Units Subcutaneous Q4H   insulin glargine  20 Units Subcutaneous Daily   metoprolol tartrate  100 mg Per Tube BID   multivitamin  1 tablet Per Tube QHS   nutrition supplement (JUVEN)  1 packet Per Tube BID   pantoprazole sodium  40 mg Per Tube Daily   prazosin  4 mg Per Tube QHS   QUEtiapine  12.5 mg Per Tube BID   sodium chloride flush  10-40 mL Intracatheter Q12H   traZODone  150 mg Per Tube QHS   Continuous Infusions:  sodium chloride Stopped (06/22/20 1614)   feeding supplement (NEPRO CARB STEADY) 55 mL/hr at 07/15/20 0600    Consultants:see note  Procedures:see note  Antimicrobials: Anti-infectives (From admission, onward)    Start     Dose/Rate Route Frequency Ordered Stop   06/28/20 1815  vancomycin (VANCOREADY) IVPB 1750 mg/350 mL        1,750 mg 175 mL/hr over 120 Minutes Intravenous  Once 06/28/20 1726 06/28/20 2054   06/28/20 1815  meropenem (MERREM) 1 g in sodium chloride 0.9 % 100 mL IVPB  Status:  Discontinued        1 g 200 mL/hr over 30 Minutes Intravenous Every 12 hours 06/28/20 1726 07/01/20 1558   06/28/20  1742  vancomycin variable dose per unstable renal function (pharmacist dosing)  Status:  Discontinued         Does not apply See admin instructions 06/28/20 1742 06/30/20 0757   06/26/20 2100  fluconazole (DIFLUCAN) IVPB 50 mg       See Hyperspace for full Linked Orders Report.   50 mg 25 mL/hr over 60 Minutes Intravenous Every 24 hours 06/25/20 1835 06/30/20 2118   06/25/20 2000  fluconazole (DIFLUCAN) IVPB 200 mg       See Hyperspace for full Linked Orders Report.   200 mg 100 mL/hr over 60 Minutes Intravenous  Once 06/25/20 1835 06/25/20 2348   06/14/20 1800  levofloxacin (LEVAQUIN) IVPB 500 mg        500 mg 100 mL/hr over 60 Minutes Intravenous Every 48 hours 06/12/20 1624 06/19/20 0210   06/12/20 1800  levofloxacin (LEVAQUIN) IVPB 750 mg        750 mg 100 mL/hr over 90 Minutes Intravenous  Once 06/11/20 2239 06/12/20 1904   06/11/20 1800  meropenem (MERREM) 500 mg in sodium chloride 0.9 % 100 mL IVPB  Status:  Discontinued        500 mg 200 mL/hr over 30 Minutes Intravenous Every 24 hours 06/10/20 1444 06/11/20 2231   06/10/20 1300  doxycycline (VIBRAMYCIN) 100 mg in sodium chloride 0.9 % 250 mL IVPB  Status:  Discontinued        100 mg 125 mL/hr over 120 Minutes Intravenous Every 12 hours 06/10/20 1149 06/17/20 1704   06/09/20 1015  meropenem (MERREM) 500 mg in sodium chloride 0.9 % 100 mL IVPB        500 mg 200 mL/hr over 30 Minutes Intravenous Every 12 hours 06/09/20 0922 06/11/20 1203   06/08/20 2200  meropenem (MERREM) 1 g in sodium  chloride 0.9 % 100 mL IVPB  Status:  Discontinued        1 g 200 mL/hr over 30 Minutes Intravenous Every 12 hours 06/08/20 0845 06/09/20 0922   06/07/20 2200  azithromycin (ZITHROMAX) 500 mg in sodium chloride 0.9 % 250 mL IVPB        500 mg 250 mL/hr over 60 Minutes Intravenous Every 24 hours 06/07/20 1441 06/11/20 2211   06/07/20 1600  ceFEPIme (MAXIPIME) 2 g in sodium chloride 0.9 % 100 mL IVPB  Status:  Discontinued        2 g 200 mL/hr over  30 Minutes Intravenous Every 8 hours 06/07/20 1441 06/07/20 1442   06/07/20 1600  meropenem (MERREM) 1 g in sodium chloride 0.9 % 100 mL IVPB  Status:  Discontinued        1 g 200 mL/hr over 30 Minutes Intravenous Every 8 hours 06/07/20 1442 06/08/20 0845   06/06/20 1630  vancomycin (VANCOREADY) IVPB 1500 mg/300 mL  Status:  Discontinued        1,500 mg 150 mL/hr over 120 Minutes Intravenous  Once 06/06/20 1533 06/06/20 1632   06/05/20 0600  cefTRIAXone (ROCEPHIN) 2 g in sodium chloride 0.9 % 100 mL IVPB  Status:  Discontinued        2 g 200 mL/hr over 30 Minutes Intravenous Every 24 hours 06/04/20 2029 06/07/20 1439   06/04/20 2200  azithromycin (ZITHROMAX) 500 mg in sodium chloride 0.9 % 250 mL IVPB        500 mg 250 mL/hr over 60 Minutes Intravenous Every 24 hours 06/04/20 2029 06/07/20 0003   06/04/20 1915  ceFEPIme (MAXIPIME) 2 g in sodium chloride 0.9 % 100 mL IVPB        2 g 200 mL/hr over 30 Minutes Intravenous  Once 06/04/20 1906 06/04/20 2055   06/04/20 1915  metroNIDAZOLE (FLAGYL) IVPB 500 mg        500 mg 100 mL/hr over 60 Minutes Intravenous  Once 06/04/20 1906 06/04/20 2055   06/04/20 1915  vancomycin (VANCOCIN) IVPB 1000 mg/200 mL premix  Status:  Discontinued        1,000 mg 200 mL/hr over 60 Minutes Intravenous  Once 06/04/20 1906 06/04/20 1912   06/04/20 1915  vancomycin (VANCOREADY) IVPB 1750 mg/350 mL        1,750 mg 175 mL/hr over 120 Minutes Intravenous  Once 06/04/20 1912 06/04/20 2257      Culture/Microbiology    Component Value Date/Time   SDES BLOOD RIGHT HAND 06/28/2020 1705   SDES BLOOD LEFT ANTECUBITAL 06/28/2020 1705   SPECREQUEST  06/28/2020 1705    BOTTLES DRAWN AEROBIC AND ANAEROBIC Blood Culture results may not be optimal due to an excessive volume of blood received in culture bottles   SPECREQUEST  06/28/2020 1705    BOTTLES DRAWN AEROBIC AND ANAEROBIC Blood Culture adequate volume   CULT  06/28/2020 1705    NO GROWTH 5 DAYS Performed at  [facility], [address] [place], [place] [address]    CULT  06/28/2020 1705    NO GROWTH 5 DAYS Performed at [facility], [address] [place], [place] [address]    REPTSTATUS 07/03/2020 FINAL 06/28/2020 1705   REPTSTATUS 07/03/2020 FINAL 06/28/2020 1705    Other culture-see note  Objective: Vitals: Today's Vitals   07/15/20 0031 07/15/20 0411 07/15/20 0500 07/15/20 0814  BP: 126/61 121/74  (!) 155/95  Pulse: 75 80  (!) 108  Resp: 18 18  18  Temp: 98.1 F (36.7 C) 98.3 F (36.8 C)  98.9 F (37.2 C)  TempSrc: Oral Oral    SpO2: 99% 96%  100%  Weight:   81.7 kg   Height:      PainSc:    6     Intake/Output Summary (Last 24 hours) at 07/15/2020 1327 Last data filed at 07/15/2020 1153 Gross per 24 hour  Intake 1350 ml  Output 4200 ml  Net -2850 ml    Filed Weights   07/13/20 0500 07/14/20 0426 07/15/20 0500  Weight: 79 kg 81.4 kg 81.7 kg   Weight change: 0.3 kg  Intake/Output from previous day: 07/10 0701 - 07/11 0700 In: 1110 [NG/GT:1110] Out: 2250 [Urine:2250] Intake/Output this shift: Total I/O In: 240 [P.O.:240] Out: 1950 [Urine:1950] Filed Weights   07/13/20 0500 07/14/20 0426 07/15/20 0500  Weight: 79 kg 81.4 kg 81.7 kg    Examination: General exam: AA, chronically ill appearing, NAD HEENT:PERRL, EOMI Respiratory system: bilaterally diminished, no use of accessory muscle, non tender. Cardiovascular system: S1 & S2 +, No murmur, No JVD. Gastrointestinal system: Abdomen soft, NT,ND, G Tube in place. Nervous System: No acute focal deficits Extremities: No edema, distal peripheral pulses palpable, offloading boots in place. GU: phallus with subcoronal hypospadias and patulous meatus.  14 Fr Foley in place. Skin: No rashes,no icterus. MSK: physical deconditioning.  Data Reviewed: I have personally reviewed following labs and imaging studies CBC: Recent Labs  Lab 07/12/20 0500  WBC 10.0  HGB 8.4*  HCT 25.6*  MCV 89.8   PLT 485    Basic Metabolic Panel: Recent Labs  Lab 07/11/20 0851 07/12/20 0500 07/13/20 0607 07/14/20 0654 07/15/20 0540  NA 137 136 134* 137 134*  K 3.8 3.3* 3.8 3.9 3.9  CL 98 100 97* 100 97*  CO2 _0 GLUCOSE 154* 136* 173* 154* 188*  BUN 104* 108* 106* 113* 88*  CREATININE 2.99* 2.79* 2.75* 2.85* 2.89*  CALCIUM 10.9* 10.7* 11.0* 11.6* 11.0*     CBG: Recent Labs  Lab 07/14/20 2010 07/15/20 0029 07/15/20 0413 07/15/20 0814 07/15/20 1151  GLUCAP 151* 110* 181* 123* 148*      Radiology Studies: No results found.   LOS: 8 days   George Hugh, MD [facility]  07/15/2020, 1:27 PM

## 2020-07-15 NOTE — TOC Progression Note (Signed)
Transition of Care (TOC) - Progression Note    Patient Details  Name: Aivan Fillingim. MRN: [id number] Date of Birth: 1960/06/27  [facility]) CM/SW Contact  Caryn Section, RN Phone Number: 07/15/2020, 3:01 PM  Clinical Narrative:   RNCM spoke to Laqueta Carina, transfer coordinator, she states [facility] is not currently accepting SNF admissions.  She stated [facility] in [place] can be both for short term and long term SNF through the [place].    RNCM spoke to patient's brother, who stated that he was in agreement with WOM.  Clinical sent, awaiting response from [facility].    Expected Discharge Plan: Skilled Nursing Facility Barriers to Discharge: Continued Medical Work up  Expected Discharge Plan and Services Expected Discharge Plan: Skilled Nursing Facility In-house Referral: NA   Post Acute Care Choice: Skilled Nursing Facility Living arrangements for the past 2 months: Single Family Home                                       Social Determinants of Health (SDOH) Interventions    Readmission Risk Interventions No flowsheet data found.

## 2020-07-16 DIAGNOSIS — J189 Pneumonia, unspecified organism: Secondary | ICD-10-CM | POA: Diagnosis not present

## 2020-07-16 LAB — BASIC METABOLIC PANEL
Anion gap: 7 (ref 5–15)
BUN: 122 mg/dL — ABNORMAL HIGH (ref 8–23)
CO2: 29 mmol/L (ref 22–32)
Calcium: 11.5 mg/dL — ABNORMAL HIGH (ref 8.9–10.3)
Chloride: 96 mmol/L — ABNORMAL LOW (ref 98–111)
Creatinine, Ser: 2.71 mg/dL — ABNORMAL HIGH (ref 0.61–1.24)
GFR, Estimated: 26 mL/min — ABNORMAL LOW (ref >60)
Glucose, Bld: 158 mg/dL — ABNORMAL HIGH (ref 70–99)
Potassium: 3.7 mmol/L (ref 3.5–5.1)
Sodium: 132 mmol/L — ABNORMAL LOW (ref 135–145)

## 2020-07-16 LAB — GLUCOSE, CAPILLARY
Glucose-Capillary: 157 mg/dL — ABNORMAL HIGH (ref 70–99)
Glucose-Capillary: 156 mg/dL — ABNORMAL HIGH (ref 70–99)
Glucose-Capillary: 193 mg/dL — ABNORMAL HIGH (ref 70–99)
Glucose-Capillary: 164 mg/dL — ABNORMAL HIGH (ref 70–99)
Glucose-Capillary: 216 mg/dL — ABNORMAL HIGH (ref 70–99)

## 2020-07-16 MED ORDER — QUETIAPINE FUMARATE 25 MG PO TABS
25.0000 mg | ORAL_TABLET | Freq: Two times a day (BID) | ORAL | Status: DC
  Administered 2020-07-16 – 2020-08-02 (×34): 25 mg
  Filled 2020-07-16 (×34): qty 1

## 2020-07-16 NOTE — TOC Progression Note (Signed)
Transition of Care (TOC) - Progression Note    Patient Details  Name: Wyatt Ball. MRN: [id number] Date of Birth: 02-Dec-1960  Transition of Care [facility]) CM/SW Contact  Caryn Section, RN Phone Number: 07/16/2020, 3:30 PM  Clinical Narrative:   Left message for Stanton Kidney at [facility], awaiting response.    Expected Discharge Plan: Skilled Nursing Facility Barriers to Discharge: Continued Medical Work up  Expected Discharge Plan and Services Expected Discharge Plan: Skilled Nursing Facility In-house Referral: NA   Post Acute Care Choice: Skilled Nursing Facility Living arrangements for the past 2 months: Single Family Home                                       Social Determinants of Health (SDOH) Interventions    Readmission Risk Interventions No flowsheet data found.

## 2020-07-16 NOTE — Progress Notes (Signed)
     Patient is pending SNF disposition at this time. Previously expressed goals and wishes are clear to continue to treat, DNR, PEG tube has been placed and patient is tolerating. He is receiving Seroquel for agitation. If not effective recommend increasing dosage. Brother is remaining hopeful and is working with TOC on placement.    No further inpatient palliative needs, goals are clear and set. Recommendations for outpatient palliative follow-up once discharged.   Please call with urgent needs or if there are changes in status/goals.   Thank you for allowing PMT to assist in Mr. Wyatt Koike Jr.'s care.   Willette Alma, AGPCNP-BC Palliative Medicine Team  Phone: [phone] Amion: N. Cousar   NO CHARGE

## 2020-07-16 NOTE — Plan of Care (Addendum)
Pt continues to be confused. Pt screams out during overnight for his brother [name]. Pt is reoriented and then continues shortly after. Unable to fully follow commands at times. Oxy given x 1 and haldol x 1. PICC dsg changed and caps. Dsg changed to sacrum, CHG / bath completed this am with full linen change.  Problem: Education: Goal: Knowledge of General Education information will improve Description: Including pain rating scale, medication(s)/side effects and non-pharmacologic comfort measures Outcome: Progressing   Problem: Health Behavior/Discharge Planning: Goal: Ability to manage health-related needs will improve Outcome: Progressing   Problem: Clinical Measurements: Goal: Ability to maintain clinical measurements within normal limits will improve Outcome: Progressing Goal: Will remain free from infection Outcome: Progressing Goal: Diagnostic test results will improve Outcome: Progressing Goal: Respiratory complications will improve Outcome: Progressing Goal: Cardiovascular complication will be avoided Outcome: Progressing   Problem: Activity: Goal: Risk for activity intolerance will decrease Outcome: Progressing   Problem: Nutrition: Goal: Adequate nutrition will be maintained Outcome: Progressing   Problem: Elimination: Goal: Will not experience complications related to bowel motility Outcome: Progressing Goal: Will not experience complications related to urinary retention Outcome: Progressing   Problem: Pain Managment: Goal: General experience of comfort will improve Outcome: Progressing   Problem: Safety: Goal: Ability to remain free from injury will improve Outcome: Progressing   Problem: Skin Integrity: Goal: Risk for impaired skin integrity will decrease Outcome: Progressing

## 2020-07-16 NOTE — Discharge Summary (Signed)
[facility] Progress Note  Patient: Wyatt Ball.    WUJ:[id number]  DOA: 06/04/2020     Date of Service: the patient was seen and examined on 07/16/2020  Brief hospital course: Past medical history of HTN, type II DM, PTSD, migraine, neurogenic bladder, remote history of transverse myelitis, GERD.  Presents with complaints of confusion found to have septic shock, unknown organism with acute kidney injury, COPD exacerbation requiring intubation and ICU stay. 5/21 admitted for sepsis and pneumonia. 6/2 transferred to ICU, intubated underwent bronchoscopy 6/3 self extubated followed by reintubated 6/7 HD started 6/15 extubated  6/19 transferred [facility]. Neurology, nephrology, urology and infectious disease consulted. 6/24 Recurrent Fevers, treated with short course of antibiotics. 6/26 Right PICC line placed. 6/27 HD stopped.  Goals of care discussed with palliative team 6/29 EGD and G Tube placed for persistent dysphagia.  Currently plan is arrange safe discharge plan to SNF.  Assessment and Plan: 1.  Acute metabolic encephalopathy Agitation PTSD Likely anoxic brain injury. Etiology of the agitation is not clear. Neurology was consulted. EEG negative Metabolic work-up also negative.   Neurology expecting that there will be gradual improvement although it will take time based on evaluation. Continue prazosin. Increase the dose of Seroquel to 25 mg twice daily.  2.  Acute kidney injury  Hypernatremia, hyperkalemia, hyperphosphatemia Renal function worsen progressively from admission. Suspect this is ATN above diabetic nephropathy like picture. Was receiving hemodialysis since June 6. Nephrology believes that the patient is not a good candidate for outpatient hemodialysis. Palliative care consulted since without hemodialysis patient's prognosis appears to be poor. Serum creatinine around 2.8, currently at baseline. Per documentation, family does not want to pursue  further HD if renal functions worsens.  Which is appropriate decision.  3.  Chronic pain syndrome with use of opioids Resuming home medication.  Will monitor.  4.  Type 2 diabetes mellitus uncontrolled with hyperglycemia With renal complication Blood sugars are well controlled right now. Hemoglobin A1c 7.4. Continue sliding scale insulin along with Premeal cover for tube feeding as well as 20 units of Lantus.  6.  Hypertension Blood pressure now stable. Somewhat elevated. Currently on amlodipine, clonidine patch, Lopressor.  Also on scheduled prazosin for PTSD.  7. neurogenic bladder with history of transverse myelitis Frequent UTI Currently has a Foley catheter.  Will monitor. Last changed on 7/8.  8.  Dysphagia. Due to encephalopathy. 6/29 PEG tube placed. Now on tube feeding.  Body mass index is 29.09 kg/m.  Nutrition Problem: Moderate Malnutrition (in the context of chronic illness) Etiology: inability to eat Interventions: Interventions: Tube feeding, Juven  9.  Moderate protein calorie malnutrition. Currently on tube feeding. Dietary following.  10. Coccyx ulcer, present on admission stage II Continue foam dressing.  11 anemia likely with kidney disease Hemoglobin stable. No active bleeding. Will monitor for now. Resume aspirin as long as remains stable.  Resolved issue. 1.  Septic shock secondary to community-acquired pneumonia, present on admission. Acute hypoxic respiratory failure, POA COPD exacerbation Presents with confusion and fevers. Chest x-ray showed pneumonia. Mentation progressively worsening as well as respiratory distress. Required ICU level care with intubation. Bronchoscopy was performed for cultures. So far blood cultures, bronchoscopy cultures, sputum cultures are negative for any significant growth other than sputum culture growing Candida which appears to be more oropharyngeal. ID was consulted. Extensive work-up was performed due to  persistent fever. Nothing significantly positive. Patient has completed 8 days of azithromycin and 3 days of cephalosporin +5 days of IV  meropenem, followed by 7 days of Levaquin and doxycycline. As of right now the patient does not appear to have any fever right now we will monitor.  No leukocytosis. Respiratory status is improved and patient is currently on room air.  2  Thrombocytopenia Resolved  Pressure Injury 06/23/20 Coccyx Posterior;Mid Stage 2 -  Partial thickness loss of dermis presenting as a shallow open injury with a red, pink wound bed without slough. (Active)  06/23/20 1400  Location: Coccyx  Location Orientation: Posterior;Mid  Staging: Stage 2 -  Partial thickness loss of dermis presenting as a shallow open injury with a red, pink wound bed without slough.  Wound Description (Comments):   Present on Admission: Yes     Diet: d1 diet.  On feeding supplements per tube DVT Prophylaxis:   heparin injection 5,000 Units Start: 06/10/20 2200  Advance goals of care discussion: DNR DNI  Family Communication: no family was present at bedside, at the time of interview.   Disposition:  Status is: Inpatient  Remains inpatient appropriate because:IV treatments appropriate due to intensity of illness or inability to take PO and Inpatient level of care appropriate due to severity of illness  Dispo: The patient is from: Home              Anticipated d/c is to:SNF              Patient currently is medically stable to d/c.   Difficult to place patient No  Subjective: Was agitated last night as well as this morning.  Requiring Haldol treatment. Reports nausea.  No abdominal pain.  Had a BM yesterday.  Physical Exam:  General: Appear in mild distress, no Rash; Oral Mucosa Clear, moist. no Abnormal Neck Mass Or lumps, Conjunctiva normal  Cardiovascular: S1 and S2 Present, no Murmur, Respiratory: good respiratory effort, Bilateral Air entry present and CTA, no Crackles, no  wheezes Abdomen: Bowel Sound present, Soft and no tenderness Extremities: no Pedal edema Neurology: alert and  not oriented to time, place, and person affect appropriate. no new focal deficit Gait not checked due to patient safety concerns  Vitals:   07/16/20 0454 07/16/20 0736 07/16/20 1043 07/16/20 1145  BP: 126/69 133/75  (!) 135/59  Pulse: 78 87 85 78  Resp: 18 16  16   Temp: 97.6 F (36.4 C) 98.2 F (36.8 C)  98.5 F (36.9 C)  TempSrc: Oral Oral  Oral  SpO2: 99% 100%  98%  Weight:      Height:        Intake/Output Summary (Last 24 hours) at 07/16/2020 1518 Last data filed at 07/16/2020 1405 Gross per 24 hour  Intake 2046.67 ml  Output 5150 ml  Net -3103.33 ml   Filed Weights   07/13/20 0500 07/14/20 0426 07/15/20 0500  Weight: 79 kg 81.4 kg 81.7 kg    Data Reviewed: I have personally reviewed and interpreted daily labs, tele strips, imaging. I reviewed all nursing notes, pharmacy notes, vitals, pertinent old records I have discussed plan of care as described above with RN and patient/family.  CBC: Recent Labs  Lab 07/12/20 0500  WBC 10.0  HGB 8.4*  HCT 25.6*  MCV 89.8  PLT 462   Basic Metabolic Panel: Recent Labs  Lab 07/12/20 0500 07/13/20 0607 07/14/20 0654 07/15/20 0540 07/16/20 0440  NA 136 134* 137 134* 132*  K 3.3* 3.8 3.9 3.9 3.7  CL 100 97* 100 97* 96*  CO2 29 29 31 29 29   GLUCOSE 136* 173* 154*  188* 158*  BUN 108* 106* 113* 88* 122*  CREATININE 2.79* 2.75* 2.85* 2.89* 2.71*  CALCIUM 10.7* 11.0* 11.6* 11.0* 11.5*    Studies: No results found.  Scheduled Meds:  amLODipine  10 mg Per Tube Daily   chlorhexidine gluconate (MEDLINE KIT)  15 mL Mouth Rinse BID   Chlorhexidine Gluconate Cloth  6 each Topical Daily   cloNIDine  0.3 mg Transdermal Weekly   feeding supplement (PROSource TF)  45 mL Per Tube Daily   free water  150 mL Per Tube Q4H   gabapentin  100 mg Per Tube Q8H   heparin injection (subcutaneous)  5,000 Units Subcutaneous  Q8H   insulin aspart  0-20 Units Subcutaneous Q4H   insulin aspart  3 Units Subcutaneous Q4H   insulin glargine  20 Units Subcutaneous Daily   metoprolol tartrate  100 mg Per Tube BID   multivitamin  1 tablet Per Tube QHS   nutrition supplement (JUVEN)  1 packet Per Tube BID   pantoprazole sodium  40 mg Per Tube Daily   prazosin  4 mg Per Tube QHS   QUEtiapine  25 mg Per Tube BID   sodium chloride flush  10-40 mL Intracatheter Q12H   traZODone  150 mg Per Tube QHS   Continuous Infusions:  sodium chloride Stopped (06/22/20 1614)   feeding supplement (NEPRO CARB STEADY) 1,000 mL (07/16/20 1410)   PRN Meds: sodium chloride, haloperidol lactate, heparin sodium (porcine), heparin sodium (porcine), hydrALAZINE, labetalol, ondansetron **OR** ondansetron (ZOFRAN) IV, oxyCODONE-acetaminophen, prazosin, sodium chloride flush  Time spent: 35 minutes  Author: Berle Mull, MD [facility] 07/16/2020 3:18 PM  To reach On-call, see care teams to locate the attending and reach out via [email or web address]. Between 7PM-7AM, please contact night-coverage If you still have difficulty reaching the attending provider, please page the [facility] (Director on Call) for [facility] on amion for assistance.

## 2020-07-16 NOTE — Progress Notes (Signed)
PT Cancellation Note  Patient Details Name: Wyatt Ball. MRN: [id number] DOB: Oct 15, 1960   Cancelled Treatment:     PT attempt, third attempt today. Pt refused requesting Thereasa Parkin return tomorrow morning. Will return next date and continue to encourage participation with therapy.    Rushie Chestnut 07/16/2020, 3:41 PM

## 2020-07-17 DIAGNOSIS — J189 Pneumonia, unspecified organism: Secondary | ICD-10-CM | POA: Diagnosis not present

## 2020-07-17 DIAGNOSIS — N179 Acute kidney failure, unspecified: Secondary | ICD-10-CM | POA: Diagnosis not present

## 2020-07-17 DIAGNOSIS — G9341 Metabolic encephalopathy: Secondary | ICD-10-CM | POA: Diagnosis not present

## 2020-07-17 LAB — GLUCOSE, CAPILLARY
Glucose-Capillary: 137 mg/dL — ABNORMAL HIGH (ref 70–99)
Glucose-Capillary: 141 mg/dL — ABNORMAL HIGH (ref 70–99)
Glucose-Capillary: 144 mg/dL — ABNORMAL HIGH (ref 70–99)
Glucose-Capillary: 151 mg/dL — ABNORMAL HIGH (ref 70–99)
Glucose-Capillary: 162 mg/dL — ABNORMAL HIGH (ref 70–99)
Glucose-Capillary: 162 mg/dL — ABNORMAL HIGH (ref 70–99)
Glucose-Capillary: 170 mg/dL — ABNORMAL HIGH (ref 70–99)

## 2020-07-17 MED ORDER — NEPRO/CARBSTEADY PO LIQD
1000.0000 mL | ORAL | Status: DC
  Administered 2020-07-17 – 2020-07-19 (×3): 1000 mL

## 2020-07-17 MED ORDER — FREE WATER
125.0000 mL | Status: DC
  Administered 2020-07-17 – 2020-07-22 (×30): 125 mL

## 2020-07-17 NOTE — Progress Notes (Signed)
SLP Cancellation Note  Patient Details Name: Wyatt Ball. MRN: [id number] DOB: Sep 29, 1960   Cancelled treatment:       Reason Eval/Treat Not Completed: Other (comment)   Pt calling out into hallway. Pt stated that he was waiting for someone to get him up into the chair. PT walked in at that time.   ST will attempt to see pt tomorrow. If pt continues to be at [facility] tomorrow, plan made for SLP to bring in some shirts for pt as he continues struggling with hospital gown. Ok'ed this plan with pt's nurse and PT.   Crickett Abbett B. Dreama Saa M.S., CCC-SLP, [place] [facility]Language [facility] Rehabilitation Services Office [phone]    Christa Fasig Dreama Saa 07/17/2020, 9:08 AM

## 2020-07-17 NOTE — TOC Progression Note (Signed)
Transition of Care (TOC) - Progression Note    Patient Details  Name: Wyatt Ball. MRN: [id number] Date of Birth: Aug 08, 1960  Transition of Care [facility]) CM/SW Contact  Wyatt Section, RN Phone Number: 07/17/2020, 10:54 AM  Clinical Narrative:   Wyatt Ball to Wyatt Ball at [facility], as Wyatt Ball (admissions coordinator) is on vacation.  Wyatt Ball will communicate with her [facility] contact at the [facility] and submit for approval.  She states this may take a few days, but she will try to get approval tomorrow if possible.   Wyatt Ball at [facility] notified.    Expected Discharge Plan: Skilled Nursing Facility Barriers to Discharge: Continued Medical Work up  Expected Discharge Plan and Services Expected Discharge Plan: Skilled Nursing Facility In-house Referral: NA   Post Acute Care Choice: Skilled Nursing Facility Living arrangements for the past 2 months: Single Family Home                                       Social Determinants of Health (SDOH) Interventions    Readmission Risk Interventions No flowsheet data found.

## 2020-07-17 NOTE — Progress Notes (Signed)
PROGRESS NOTE    Wyatt Ball.  LMB:[id number] DOB: 1960/07/30 DOA: 06/04/2020 PCP: System, Provider Not In     Chief Complaint  Patient presents with   Altered Mental Status    Brief Narrative:   HTN, type II DM, PTSD, migraine, neurogenic bladder, remote history of transverse myelitis, GERD.  Presents with complaints of confusion found to have septic shock, unknown organism with acute kidney injury, COPD exacerbation requiring intubation and ICU stay. 5/21 admitted for sepsis and pneumonia. 6/2 transferred to ICU, intubated underwent bronchoscopy 6/3 self extubated followed by reintubated 6/7 HD started 6/15 extubated 6/19 transferred to [facility]. Neurology, nephrology, urology and infectious disease consulted. 6/24 Recurrent Fevers, treated with short course of antibiotics. 6/26 Right PICC line placed. 6/27 HD stopped.  Goals of care discussed with palliative team 6/29 EGD and G Tube placed for persistent dysphagia.   Currently plan is arrange safe discharge plan to SNF.  Assessment & Plan:   Principal Problem:   CAP (community acquired pneumonia) Active Problems:   Acute metabolic encephalopathy   Severe sepsis with septic shock (HCC)   Neurogenic bladder   Chronic, continuous use of opioids   Chronic low back pain   Type 2 diabetes mellitus without complication (HCC)   Migraines   Recurrent UTI   History of colon polyps   Urinary retention   Altered mental status   Pressure injury of skin   AKI (acute kidney injury) ([place])   Malnutrition of moderate degree   Acute metabolic encephalopathy Secondary to anoxic brain injury Urology consulted, suggested gradual improvement over time. Metabolic work-up so far negative. EEG is negative   Acute kidney injury associated with hypernatremia hyperkalemia and hyperphosphatemia Probably secondary to ATN with underlying diabetic nephropathy Patient was on dialysis temporarily and nephrology believed that patient  is not a good candidate for outpatient HD. Patient and family does not want to pursue further hemodialysis if renal function worsens.    Chronic pain syndrome Resume home medications    Type 2 diabetes mellitus with renal complications Continue with sliding scale insulin A1c 7.4 Insulin-dependent    Essential hypertension Blood pressure parameters appear to be optimal at this time.   Anemia of chronic disease Transfuse to keep hemoglobin greater than 7.   Septic shock secondary to community-acquired pneumonia present on admission Acute severe hypoxic respiratory failure in the setting of COPD exacerbation Resolved at this time.  Complete the course of IV antibiotics.  He was initially intubated in NICU later on self extubated.   Related protein calorie malnutrition S/p PEG placement and on tube feeds at this time.  Pressure injury present on admission Pressure Injury 06/23/20 Coccyx Posterior;Mid Stage 2 -  Partial thickness loss of dermis presenting as a shallow open injury with a red, pink wound bed without slough. (Active)  06/23/20 1400  Location: Coccyx  Location Orientation: Posterior;Mid  Staging: Stage 2 -  Partial thickness loss of dermis presenting as a shallow open injury with a red, pink wound bed without slough.  Wound Description (Comments):   Present on Admission: Yes   Foam dressing at present         DVT prophylaxis: Heparin Code Status: (Full code none at bedside Family Communication: (None at bedside disposition:   Status is: Inpatient  Remains inpatient appropriate because:Unsafe d/c plan  Dispo: The patient is from: Home              Anticipated d/c is to: SNF  Patient currently is medically stable to d/c.   Difficult to place patient Yes       Consultants:  Neurology Nephrology     Subjective: No new complaints.   Objective: Vitals:   07/17/20 0007 07/17/20 0442 07/17/20 0750 07/17/20 1202  BP: 132/67  123/72 137/67 114/71  Pulse: 76 79 86 83  Resp: 20 16 20 18   Temp: 97.7 F (36.5 C) 98.5 F (36.9 C) 98.8 F (37.1 C) 97.6 F (36.4 C)  TempSrc: Oral  Oral Oral  SpO2: 96% 98% 96% 100%  Weight:      Height:        Intake/Output Summary (Last 24 hours) at 07/17/2020 1536 Last data filed at 07/17/2020 1403 Gross per 24 hour  Intake 1827.25 ml  Output 3625 ml  Net -1797.75 ml   Filed Weights   07/13/20 0500 07/14/20 0426 07/15/20 0500  Weight: 79 kg 81.4 kg 81.7 kg    Examination:  General exam: Appears calm and comfortable  Respiratory system: Clear to auscultation. Respiratory effort normal. Cardiovascular system: S1 & S2 heard, RRR. No JVD, murmurs, rubs, gallops or clicks. No pedal edema. Gastrointestinal system: Abdomen is nondistended, soft and nontender. Normal bowel sounds heard. Central nervous system: Alert and oriented. No focal neurological deficits. Extremities: Symmetric 5 x 5 power. Skin: No rashes, lesions or ulcers Psychiatry: Mood & affect appropriate.     Data Reviewed: I have personally reviewed following labs and imaging studies  CBC: Recent Labs  Lab 07/12/20 0500  WBC 10.0  HGB 8.4*  HCT 25.6*  MCV 89.8  PLT 725    Basic Metabolic Panel: Recent Labs  Lab 07/12/20 0500 07/13/20 0607 07/14/20 0654 07/15/20 0540 07/16/20 0440  NA 136 134* 137 134* 132*  K 3.3* 3.8 3.9 3.9 3.7  CL 100 97* 100 97* 96*  CO2 29 29 31 29 29   GLUCOSE 136* 173* 154* 188* 158*  BUN 108* 106* 113* 88* 122*  CREATININE 2.79* 2.75* 2.85* 2.89* 2.71*  CALCIUM 10.7* 11.0* 11.6* 11.0* 11.5*    GFR: Estimated Creatinine Clearance: 28.7 mL/min (A) (by C-G formula based on SCr of 2.71 mg/dL (H)).  Liver Function Tests: No results for input(s): AST, ALT, ALKPHOS, BILITOT, PROT, ALBUMIN in the last 168 hours.  CBG: Recent Labs  Lab 07/17/20 0012 07/17/20 0417 07/17/20 0602 07/17/20 0934 07/17/20 1203  GLUCAP 162* 144* 141* 151* 170*     No results  found for this or any previous visit (from the past 240 hour(s)).       Radiology Studies: No results found.      Scheduled Meds:  amLODipine  10 mg Per Tube Daily   chlorhexidine gluconate (MEDLINE KIT)  15 mL Mouth Rinse BID   Chlorhexidine Gluconate Cloth  6 each Topical Daily   cloNIDine  0.3 mg Transdermal Weekly   feeding supplement (PROSource TF)  45 mL Per Tube Daily   free water  150 mL Per Tube Q4H   gabapentin  100 mg Per Tube Q8H   heparin injection (subcutaneous)  5,000 Units Subcutaneous Q8H   insulin aspart  0-20 Units Subcutaneous Q4H   insulin aspart  3 Units Subcutaneous Q4H   insulin glargine  20 Units Subcutaneous Daily   metoprolol tartrate  100 mg Per Tube BID   multivitamin  1 tablet Per Tube QHS   nutrition supplement (JUVEN)  1 packet Per Tube BID   pantoprazole sodium  40 mg Per Tube Daily   prazosin  4  mg Per Tube QHS   QUEtiapine  25 mg Per Tube BID   sodium chloride flush  10-40 mL Intracatheter Q12H   traZODone  150 mg Per Tube QHS   Continuous Infusions:  sodium chloride Stopped (06/22/20 1614)   feeding supplement (NEPRO CARB STEADY) 55 mL/hr at 07/17/20 0604     LOS: 57 days        Hosie Poisson, MD [facility]   To contact the attending provider between 7A-7P or the covering provider during after hours 7P-7A, please log into the web site [email or web address] and access using universal [place] password for that web site. If you do not have the password, please call the hospital operator.  07/17/2020, 3:36 PM

## 2020-07-17 NOTE — Evaluation (Signed)
Occupational Therapy Evaluation Patient Details Name: Wyatt Ball. MRN: [id number] DOB: 03-12-1960 Today's Date: 07/17/2020    History of Present Illness Wyatt Clarida Montez Hageman. is a 60 y.o. 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  Since hospitalization pt has been intubated, finally extubated 6/15.   Clinical Impression   Pt seen for OT re-evaluation on this date in setting of prolonged hospitalization. OT/PT co-tx this date to address ADL/functional transfers. Upon arrival to room, pt awake and seated upright in bed with PT present. Pt A&Ox2 and agreeable to tx. Pt continues to present with decreased awareness of deficits, decreased strength, decreased balance, and decreased activity tolerance, however has been making good progress towards goals and has achieved 2/3 goals this date; goals have been updated to reflect recent progress. Pt currently requires MIN-MOD A for bed-level UB ADLs, MOD-MAX A+2 for bed mobility, MOD A to sit EOB for ~80mins while performing seated ADLs/LB therex, and MIN A to drink from cup while seated EOB. At end of session, pt transferred to recliner via mechanical lift (Hoyer lift) to ensure optimal positioning for breakfast. Pt left in recliner with nurse tech present, and with all needs within reach. Pt continues to benefit from skilled OT services to maximize return to PLOF and minimize risk of future falls, injury, caregiver burden, and readmission. Will continue to follow POC. Discharge recommendation remains appropriate.      Follow Up Recommendations  SNF    Equipment Recommendations  Other (comment) (defer to next  venue of care)       Precautions / Restrictions Precautions Precautions: Fall Restrictions Weight Bearing Restrictions: No      Mobility Bed Mobility Overal bed mobility: Needs Assistance Bed Mobility: Supine to Sit;Sit to Supine;Rolling Rolling: Mod assist;+2 for physical assistance;+2 for safety/equipment   Supine to sit: Max assist;+2 for physical assistance;+2 for safety/equipment          Transfers Overall transfer level: Needs assistance               General transfer comment: Pt was transfered OOB to recliner via hoyer lift. +2 to perform for safety/protocol. RN staff to assist with returning to bed.    Balance Overall balance assessment: Needs assistance Sitting-balance support: Bilateral upper extremity supported;Feet supported Sitting balance-Leahy Scale: Poor Sitting balance - Comments: Pt tolerated sitting EOB ~10 minutes while performing seated ADLs and LE therex. Pt required MOD A to maintain midline d/t L lateral lean Postural control: Left lateral lean     Standing balance comment: unsafe to attempt                           ADL either performed or assessed with clinical judgement   ADL Overall ADL's : Needs assistance/impaired Eating/Feeding: Minimal assistance;Sitting Eating/Feeding Details (indicate cue type and reason): While sitting EOB, pt requiring MIN A to hold cup in hand and bring to mouth. Pt requires VCs for taking small sips Grooming: Wash/dry face;Minimal assistance;Bed level Grooming Details (indicate cue type and reason): Pt using LUE to assist in bringing RUE to wash R side of head d/t decreased active shoulder flexion in RUE. MIN A for  thorougness in washing face.         Upper Body Dressing : Moderate assistance;Bed level Upper Body Dressing Details (indicate cue type and reason): to don/doff hospital gown         Toileting- Clothing Manipulation and Hygiene: Maximal assistance;Bed level Toileting - Clothing  Manipulation Details (indicate cue type and reason): MAX A for posterior peri-care                  Extremity/Trunk Assessment Upper Extremity Assessment Upper Extremity Assessment: Generalized weakness;RUE deficits/detail RUE Deficits / Details: Strength RUE < LUE, with pt requiring HHA from LUE to assist with RUE shoulder flexion during ADLs   Lower Extremity Assessment Lower Extremity Assessment: Generalized weakness          Cognition Arousal/Alertness: Awake/alert Behavior During Therapy: WFL for tasks assessed/performed Overall Cognitive Status: History of cognitive impairments - at baseline                                 General Comments: Pt is alert and conversational, however disoriented to time and situation. Pt able to follow simple one step commands with increased time.          OT Problem List: Decreased strength;Decreased range of motion;Decreased activity tolerance;Decreased coordination;Decreased cognition      OT Treatment/Interventions: Self-care/ADL training;DME and/or AE instruction;Therapeutic activities;Balance training;Therapeutic exercise;Energy conservation;Patient/family education;Manual therapy    OT Goals(Current goals can be found in the care plan section) Acute Rehab OT Goals Patient Stated Goal: to drink water OT Goal Formulation: With patient Time For Goal Achievement: 07/31/20 Potential to Achieve Goals: Fair ADL Goals Pt Will Perform Grooming: with set-up;with supervision;bed level Pt Will Perform Upper Body Dressing: with min assist;sitting  OT Frequency: Min 1X/week           Co-evaluation PT/OT/SLP Co-Evaluation/Treatment: Yes Reason for Co-Treatment: Complexity of the patient's impairments (multi-system involvement);Necessary to address cognition/behavior during functional activity;For patient/therapist safety;To address functional/ADL transfers PT goals addressed during session: Mobility/safety with  mobility;Balance;Strengthening/ROM OT goals addressed during session: ADL's and self-care      AM-PAC OT "6 Clicks" Daily Activity     Outcome Measure Help from another person eating meals?: A Lot Help from another person taking care of personal grooming?: A Little Help from another person toileting, which includes using toliet, bedpan, or urinal?: Total Help from another person bathing (including washing, rinsing, drying)?: A Lot Help from another person to put on and taking off regular upper body clothing?: A Lot Help from another person to put on and taking off regular lower body clothing?: A Lot 6 Click Score: 12   End of Session Equipment Utilized During Treatment: Other (comment) (hoyer lift) Nurse Communication: Mobility status  Activity Tolerance: Patient tolerated treatment well Patient left: in chair;with call bell/phone within reach;with chair alarm set;with nursing/sitter in room  OT Visit Diagnosis: Other symptoms and signs involving cognitive function                Time: 7824-2353 OT Time Calculation (min): 54 min Charges:  OT General Charges $OT Visit: 1 Visit OT Evaluation $OT Re-eval: 1 Re-eval OT Treatments $Therapeutic Activity: 23-37 mins  Matthew Folks, OTR/L [facility] [phone]

## 2020-07-17 NOTE — Progress Notes (Addendum)
Nutrition Follow-up  DOCUMENTATION CODES:  Non-severe (moderate) malnutrition in context of chronic illness  INTERVENTION:  Continue current diet as ordered, adjust per SLP recommendations Continue TF, adjust rate as PO intake has increased Nepro 1.8 @ 40 ml/hr  Free water flushes 165m q 4 hours Prosource TF 482mdaily (1 packet) provides 40kcal and 11g of protein Regimen provides 1739 kcal/day, 89g/day protein and 11448 mL/day free water (flushes + TF) Rena-vit daily via tube  Juven Fruit Punch BID via tube, each serving provides 95kcal and 2.5g of protein (amino acids glutamine and arginine) Request phosphorus in AM to monitor renal function  NUTRITION DIAGNOSIS:  Moderate Malnutrition (in the context of chronic illness) related to inability to eat as evidenced by mild fat depletion, mild muscle depletion, moderate muscle depletion  - ongoing  GOAL:  Patient will meet greater than or equal to 90% of their needs  - met with TF  MONITOR:  TF tolerance, Labs, Weight trends  ASSESSMENT:  [age].o. male with h/o hypertension, PTSD, diabetes, migraines, GERD, neurogenic bladder secondary to remote history of transverse myelitis, requires self-catheterization and has frequent UTIs and opioids for chronic pain who is admitted on 06/04/2020 for CAP and sepsis   6/6 HD initiation 6/17 NGT replaced 6/24 NGT replaced again 6/27 - Nephrology signed off, no further HD 6/29 - PEG placed by GI 7/6 - SLP advanced to clear liquids 7/7 - SLP advanced to DYS 1 diet  Palliative care met with family on 6/27 and decision was made to move forward with PEG placement as pt has not made any progress towards PO intake goals with SLP. PEG placed by GI 6/29 and is now being used for feedings.   Pt able to have diet advanced by SLP and is currently on a dys 1 diet. RN reports that pt ate ~50% of breakfast but did not consume much for lunch.   Visited pt in room, not very interactive today, states he feels  likes he is "going to puke." Noted that pt is stable for dc and CM is preparing to send him to facility when bed is available. Will adjust regimen to meet 75% of pt's estimated needs. Pt will dc to facility, renal function has steadily improved this admission. Once at facility, RD could consider trial of standard formula to see if pt's labs remain stable. Could also switch to a regimen of bolus feeds.   Nutritionally Relevant Medications: Scheduled Meds:  feeding supplement (PROSource TF)  45 mL Per Tube Daily   free water  150 mL Per Tube Q4H   insulin aspart  0-20 Units Subcutaneous Q4H   insulin aspart  3 Units Subcutaneous Q4H   insulin glargine  20 Units Subcutaneous Daily   multivitamin  1 tablet Per Tube QHS   nutrition supplement (JUVEN)  1 packet Per Tube BID   pantoprazole sodium  40 mg Per Tube Daily   Continuous Infusions:  feeding supplement (NEPRO CARB STEADY) 55 mL/hr at 07/17/20 0604   PRN Meds: ondansetron  Labs Reviewed: BUN 122, creatinine 2.71 (7/12) SBG ranges from 141-216 mg/dL over the last 24 hours HgbA1c 7.4% (5/31)  Nutrition Focused Physical Exam Flowsheet Row Most Recent Value  Orbital Region Mild depletion  Upper Arm Region No depletion  Thoracic and Lumbar Region No depletion  Buccal Region Mild depletion  Temple Region Moderate depletion  Clavicle Bone Region Mild depletion  Clavicle and Acromion Bone Region Mild depletion  Scapular Bone Region Mild depletion  Dorsal Hand  Mild depletion  Patellar Region Mild depletion  Anterior Thigh Region Mild depletion  Posterior Calf Region Moderate depletion  Edema (RD Assessment) None  Hair Reviewed  Eyes Reviewed  Mouth Reviewed  Skin Reviewed  Nails Reviewed   Diet Order:   Diet Order             DIET - DYS 1 Room service appropriate? Yes; Fluid consistency: Thin  Diet effective now                  EDUCATION NEEDS:  No education needs have been identified at this time  Skin:  Skin  Assessment: Skin Integrity Issues: (sacral wound 1.8 cm x 0.8 cm) Skin Integrity Issues:: Stage II Stage II: coccyx  Last BM:  7/4 - type 6  Height:  Ht Readings from Last 1 Encounters:  06/18/20 5' 5.98" (1.676 m)    Weight:  Wt Readings from Last 1 Encounters:  07/15/20 81.7 kg   Ideal Body Weight:  64.5 kg  BMI:  Body mass index is 29.09 kg/m.  Estimated Nutritional Needs:  Kcal:  2200-2400 kcal/d Protein:  110-125g/d Fluid:  2-2.2 L/d  Ranell Patrick, RD, LDN Clinical Dietitian Pager on [place]

## 2020-07-17 NOTE — Progress Notes (Signed)
Physical Therapy Treatment Patient Details Name: Wyatt Ball. MRN: [id number] DOB: [birth year] Today's Date: 07/17/2020    History of Present Illness Wyatt Ball Hageman. is a 60 y.o. 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  Since hospitalization pt has been intubated, finally extubated 6/15.    PT Comments    Pt was long sitting in bed awake but disoriented upon arriving. He is cooperative and able to follow simple one step commands with increased time to process + assistance. PT/OT co treat 2/2 to pt requiring +2 assist for safety. He was able to tolerated EOB sitting x ~ 10 minutes with constant assistance to maintain balance. Perform LAQ and seated marching while sitting EOB. Continues to present with severe weakness in all extremities. Had BM during session. Once fatigued, pt was returned to supine then hoyer lifted to recliner. Pt tolerated well. RN/RN tech aware. Pt does demonstrate much improve activity tolerance and alertness. Will continue to progress pt per current POC. Will need extensive PT going forward to improve independence while decreasing caregiver burden.    Follow Up Recommendations  SNF     Equipment Recommendations  Other (comment) (defer to next level of care)       Precautions / Restrictions Precautions Precautions: Fall Restrictions Weight Bearing Restrictions: No    Mobility  Bed Mobility Overal bed mobility: Needs Assistance Bed Mobility: Supine to Sit;Sit to Supine;Rolling Rolling: Mod assist;+2 for physical assistance;+2 for safety/equipment   Supine to sit: Max assist;+2 for physical assistance;+2 for  safety/equipment     General bed mobility comments: Pt was able to progress from supine to side lying to short sit EOB with increased time and +2 assist. tolerated sitting EOB x ~ 10 minutes while performing several LE ther ex. poor sitting posture throughout with constant assistance and extension cues. Overall tolerated EOB sitting well    Transfers Overall transfer level: Needs assistance               General transfer comment: Pt was transfered OOB to recliner via hoyer lift. +2 to perform for safety/protocol. RN staff to assist with returning to bed.  Ambulation/Gait             General Gait Details: unable/unsafe      Balance Overall balance assessment: Needs assistance Sitting-balance support: Bilateral upper extremity supported;Feet supported Sitting balance-Leahy Scale: Poor Sitting balance - Comments: pt requires constant assistance to maintain balabnce EOB. severe L lateral lean throughout.       Standing balance comment: unsafe to attempt standing due to severe weakness        Cognition Arousal/Alertness: Awake/alert Behavior During Therapy: WFL for tasks assessed/performed Overall Cognitive Status: History of cognitive impairments - at baseline Area of Impairment: Orientation;Following commands;Safety/judgement;Awareness;Problem solving      General Comments: Pt is alert and conversational however disoriented. was able to follow simple one step commands with increased time.             Pertinent Vitals/Pain Pain Assessment: 0-10 Pain Score:  (did not formally rate) Pain Location: chronic back pain Pain Intervention(s): Limited activity within patient's tolerance;Monitored during session;Repositioned     PT Goals (current goals can now be  found in the care plan section) Acute Rehab PT Goals Patient Stated Goal: none stated Progress towards PT goals: Progressing toward goals    Frequency    Min 2X/week      PT Plan Current plan  remains appropriate    Co-evaluation PT/OT/SLP Co-Evaluation/Treatment: Yes Reason for Co-Treatment: Complexity of the patient's impairments (multi-system involvement);Necessary to address cognition/behavior during functional activity;For patient/therapist safety;To address functional/ADL transfers PT goals addressed during session: Balance;Mobility/safety with mobility;Strengthening/ROM        AM-PAC PT "6 Clicks" Mobility   Outcome Measure  Help needed turning from your back to your side while in a flat bed without using bedrails?: A Lot Help needed moving from lying on your back to sitting on the side of a flat bed without using bedrails?: A Lot Help needed moving to and from a bed to a chair (including a wheelchair)?: Total Help needed standing up from a chair using your arms (e.g., wheelchair or bedside chair)?: Total Help needed to walk in hospital room?: Total Help needed climbing 3-5 steps with a railing? : Total 6 Click Score: 8    End of Session   Activity Tolerance: Patient tolerated treatment well Patient left: in chair;with call bell/phone within reach;with chair alarm set Nurse Communication: Mobility status PT Visit Diagnosis: Muscle weakness (generalized) (M62.81);Adult, failure to thrive (R62.7);Pain     Time: 1884-1660 PT Time Calculation (min) (ACUTE ONLY): 54 min  Charges:  $Therapeutic Activity: 23-37 mins                     Jetta Lout PTA 07/17/20, 10:57 AM

## 2020-07-18 DIAGNOSIS — G9341 Metabolic encephalopathy: Secondary | ICD-10-CM | POA: Diagnosis not present

## 2020-07-18 DIAGNOSIS — N179 Acute kidney failure, unspecified: Secondary | ICD-10-CM | POA: Diagnosis not present

## 2020-07-18 DIAGNOSIS — J189 Pneumonia, unspecified organism: Secondary | ICD-10-CM | POA: Diagnosis not present

## 2020-07-18 LAB — GLUCOSE, CAPILLARY
Glucose-Capillary: 139 mg/dL — ABNORMAL HIGH (ref 70–99)
Glucose-Capillary: 139 mg/dL — ABNORMAL HIGH (ref 70–99)
Glucose-Capillary: 150 mg/dL — ABNORMAL HIGH (ref 70–99)
Glucose-Capillary: 156 mg/dL — ABNORMAL HIGH (ref 70–99)
Glucose-Capillary: 157 mg/dL — ABNORMAL HIGH (ref 70–99)
Glucose-Capillary: 217 mg/dL — ABNORMAL HIGH (ref 70–99)

## 2020-07-18 NOTE — Progress Notes (Signed)
PROGRESS NOTE    Wyatt Ball.  IEP:[id number] DOB: [birth year] DOA: 06/04/2020 PCP: System, Provider Not In     Chief Complaint  Patient presents with   Altered Mental Status    Brief Narrative:   HTN, type II DM, PTSD, migraine, neurogenic bladder, remote history of transverse myelitis, GERD.  Presents with complaints of confusion found to have septic shock, unknown organism with acute kidney injury, COPD exacerbation requiring intubation and ICU stay. 5/21 admitted for sepsis and pneumonia. 6/2 transferred to ICU, intubated underwent bronchoscopy 6/3 self extubated followed by reintubated 6/7 HD started 6/15 extubated 6/19 transferred to [facility]. Neurology, nephrology, urology and infectious disease consulted. 6/24 Recurrent Fevers, treated with short course of antibiotics. 6/26 Right PICC line placed. 6/27 HD stopped.  Goals of care discussed with palliative team 6/29 EGD and G Tube placed for persistent dysphagia.   Currently plan is arrange safe discharge plan to SNF. Pt seen and examined at bedside.   Assessment & Plan:   Principal Problem:   CAP (community acquired pneumonia) Active Problems:   Acute metabolic encephalopathy   Severe sepsis with septic shock (HCC)   Neurogenic bladder   Chronic, continuous use of opioids   Chronic low back pain   Type 2 diabetes mellitus without complication (HCC)   Migraines   Recurrent UTI   History of colon polyps   Urinary retention   Altered mental status   Pressure injury of skin   AKI (acute kidney injury) ([place])   Malnutrition of moderate degree   Acute metabolic encephalopathy Secondary to anoxic brain injury Urology consulted, suggested gradual improvement over time. Metabolic work-up so far negative. EEG is negative. He is alert and oriented, non focal.    Acute kidney injury associated with hypernatremia hyperkalemia and hyperphosphatemia Probably secondary to ATN with underlying diabetic  nephropathy Patient was on dialysis temporarily and nephrology believed that patient is not a good candidate for outpatient HD. Patient and family does not want to pursue further hemodialysis if renal function worsens.    Chronic pain syndrome Resume home medications    Type 2 diabetes mellitus with renal complications Continue with sliding scale insulin A1c 7.4 Insulin-dependent. CBG (last 3)  Recent Labs    07/18/20 0357 07/18/20 0728 07/18/20 1125  GLUCAP 150* 139* 157*       Essential hypertension Blood pressure parameters appear to be optimal at this time.   Anemia of chronic disease Transfuse to keep hemoglobin greater than 7. Recheck labs in am.    Septic shock secondary to community-acquired pneumonia present on admission Acute severe hypoxic respiratory failure in the setting of COPD exacerbation Resolved at this time.  Complete the course of IV antibiotics.  He was initially intubated in NICU later on self extubated.   Related protein calorie malnutrition S/p PEG placement and on tube feeds at this time.  Pressure injury present on admission Pressure Injury 06/23/20 Coccyx Posterior;Mid Stage 2 -  Partial thickness loss of dermis presenting as a shallow open injury with a red, pink wound bed without slough. (Active)  06/23/20 1400  Location: Coccyx  Location Orientation: Posterior;Mid  Staging: Stage 2 -  Partial thickness loss of dermis presenting as a shallow open injury with a red, pink wound bed without slough.  Wound Description (Comments):   Present on Admission: Yes   Foam dressing at present.         DVT prophylaxis: Heparin Code Status: Full code  Family Communication: None at bedside  disposition:  Status is: Inpatient  Remains inpatient appropriate because:Unsafe d/c plan  Dispo: The patient is from: Home              Anticipated d/c is to: SNF              Patient currently is medically stable to d/c.   Difficult to place  patient Yes       Consultants:  Neurology Nephrology     Subjective: No new complaints.   Objective: Vitals:   07/18/20 0130 07/18/20 0451 07/18/20 0743 07/18/20 1141  BP: (!) 124/59 135/72 (!) 141/82 (!) 107/54  Pulse: 74 87 94 67  Resp: _0 Temp: 98.6 F (37 C) 97.7 F (36.5 C) 98.1 F (36.7 C) 98.6 F (37 C)  TempSrc:   Oral Oral  SpO2: 99% 98% 99% 97%  Weight:  82.6 kg    Height:        Intake/Output Summary (Last 24 hours) at 07/18/2020 1448 Last data filed at 07/18/2020 1427 Gross per 24 hour  Intake 1715.5 ml  Output 3225 ml  Net -1509.5 ml    Filed Weights   07/14/20 0426 07/15/20 0500 07/18/20 0451  Weight: 81.4 kg 81.7 kg 82.6 kg    Examination:  General exam: alert and comfortable.  Respiratory system: air entry fair bilateral. No wheezing heard.  Cardiovascular system: S1 & S2 heard, RRR, no JVD, no pedal edema.  Gastrointestinal system: Abdomen is soft, nt nd bs+ s/p PEG placement.  Central nervous system: Alert and oriented, non focal.  Extremities: no cyanosis.  Skin: no rashes.  Psychiatry: Mood is appropriate.     Data Reviewed: I have personally reviewed following labs and imaging studies  CBC: Recent Labs  Lab 07/12/20 0500  WBC 10.0  HGB 8.4*  HCT 25.6*  MCV 89.8  PLT 326     Basic Metabolic Panel: Recent Labs  Lab 07/12/20 0500 07/13/20 0607 07/14/20 0654 07/15/20 0540 07/16/20 0440  NA 136 134* 137 134* 132*  K 3.3* 3.8 3.9 3.9 3.7  CL 100 97* 100 97* 96*  CO2 _1 GLUCOSE 136* 173* 154* 188* 158*  BUN 108* 106* 113* 88* 122*  CREATININE 2.79* 2.75* 2.85* 2.89* 2.71*  CALCIUM 10.7* 11.0* 11.6* 11.0* 11.5*     GFR: Estimated Creatinine Clearance: 28.9 mL/min (A) (by C-G formula based on SCr of 2.71 mg/dL (H)).  Liver Function Tests: No results for input(s): AST, ALT, ALKPHOS, BILITOT, PROT, ALBUMIN in the last 168 hours.  CBG: Recent Labs  Lab 07/17/20 2128 07/18/20 0029  07/18/20 0357 07/18/20 0728 07/18/20 1125  GLUCAP 137* 139* 150* 139* 157*      No results found for this or any previous visit (from the past 240 hour(s)).       Radiology Studies: No results found.      Scheduled Meds:  amLODipine  10 mg Per Tube Daily   chlorhexidine gluconate (MEDLINE KIT)  15 mL Mouth Rinse BID   Chlorhexidine Gluconate Cloth  6 each Topical Daily   cloNIDine  0.3 mg Transdermal Weekly   feeding supplement (PROSource TF)  45 mL Per Tube Daily   free water  125 mL Per Tube Q4H   gabapentin  100 mg Per Tube Q8H   heparin injection (subcutaneous)  5,000 Units Subcutaneous Q8H   insulin aspart  0-20 Units Subcutaneous Q4H   insulin aspart  3 Units Subcutaneous Q4H   insulin glargine  20 Units  Subcutaneous Daily   metoprolol tartrate  100 mg Per Tube BID   multivitamin  1 tablet Per Tube QHS   nutrition supplement (JUVEN)  1 packet Per Tube BID   pantoprazole sodium  40 mg Per Tube Daily   prazosin  4 mg Per Tube QHS   QUEtiapine  25 mg Per Tube BID   sodium chloride flush  10-40 mL Intracatheter Q12H   traZODone  150 mg Per Tube QHS   Continuous Infusions:  sodium chloride Stopped (06/22/20 1614)   feeding supplement (NEPRO CARB STEADY) 1,000 mL (07/18/20 0813)     LOS: 44 days        Hosie Poisson, MD [facility]   To contact the attending provider between 7A-7P or the covering provider during after hours 7P-7A, please log into the web site [email or web address] and access using universal [place] password for that web site. If you do not have the password, please call the hospital operator.  07/18/2020, 2:48 PM

## 2020-07-18 NOTE — TOC Progression Note (Signed)
Transition of Care (TOC) - Progression Note    Patient Details  Name: Wyatt Ball. MRN: [id number] Date of Birth: 01/08/1960  Transition of Care [facility]) CM/SW Contact  Caryn Section, RN Phone Number: 07/18/2020, 3:52 PM  Clinical Narrative:   Message left for [facility], continuing to wait for response with [facility] authorization for SNF placement.      Expected Discharge Plan: Skilled Nursing Facility Barriers to Discharge: Continued Medical Work up  Expected Discharge Plan and Services Expected Discharge Plan: Skilled Nursing Facility In-house Referral: NA   Post Acute Care Choice: Skilled Nursing Facility Living arrangements for the past 2 months: Single Family Home                                       Social Determinants of Health (SDOH) Interventions    Readmission Risk Interventions No flowsheet data found.

## 2020-07-19 DIAGNOSIS — G9341 Metabolic encephalopathy: Secondary | ICD-10-CM | POA: Diagnosis not present

## 2020-07-19 DIAGNOSIS — N179 Acute kidney failure, unspecified: Secondary | ICD-10-CM | POA: Diagnosis not present

## 2020-07-19 DIAGNOSIS — J189 Pneumonia, unspecified organism: Secondary | ICD-10-CM | POA: Diagnosis not present

## 2020-07-19 LAB — CBC
HCT: 23.9 % — ABNORMAL LOW (ref 39.0–52.0)
Hemoglobin: 8 g/dL — ABNORMAL LOW (ref 13.0–17.0)
MCH: 30.2 pg (ref 26.0–34.0)
MCHC: 33.5 g/dL (ref 30.0–36.0)
MCV: 90.2 fL (ref 80.0–100.0)
Platelets: 310 10*3/uL (ref 150–400)
RBC: 2.65 MIL/uL — ABNORMAL LOW (ref 4.22–5.81)
RDW: 14.2 % (ref 11.5–15.5)
WBC: 9.9 10*3/uL (ref 4.0–10.5)
nRBC: 0 % (ref 0.0–0.2)

## 2020-07-19 LAB — BASIC METABOLIC PANEL
Anion gap: 8 (ref 5–15)
BUN: 94 mg/dL — ABNORMAL HIGH (ref 8–23)
CO2: 30 mmol/L (ref 22–32)
Calcium: 11.2 mg/dL — ABNORMAL HIGH (ref 8.9–10.3)
Chloride: 96 mmol/L — ABNORMAL LOW (ref 98–111)
Creatinine, Ser: 2.6 mg/dL — ABNORMAL HIGH (ref 0.61–1.24)
GFR, Estimated: 27 mL/min — ABNORMAL LOW (ref >60)
Glucose, Bld: 136 mg/dL — ABNORMAL HIGH (ref 70–99)
Potassium: 3.5 mmol/L (ref 3.5–5.1)
Sodium: 134 mmol/L — ABNORMAL LOW (ref 135–145)

## 2020-07-19 LAB — GLUCOSE, CAPILLARY
Glucose-Capillary: 128 mg/dL — ABNORMAL HIGH (ref 70–99)
Glucose-Capillary: 128 mg/dL — ABNORMAL HIGH (ref 70–99)
Glucose-Capillary: 141 mg/dL — ABNORMAL HIGH (ref 70–99)
Glucose-Capillary: 163 mg/dL — ABNORMAL HIGH (ref 70–99)
Glucose-Capillary: 177 mg/dL — ABNORMAL HIGH (ref 70–99)
Glucose-Capillary: 177 mg/dL — ABNORMAL HIGH (ref 70–99)

## 2020-07-19 NOTE — Progress Notes (Signed)
  Speech Language Pathology Treatment: Cognitive-Linquistic  Patient Details Name: Wyatt Ball. MRN: [id number] DOB: [birth year] Today's Date: 07/19/2020 Time: 6301-6010 SLP Time Calculation (min) (ACUTE ONLY): 10 min  Assessment / Plan / Recommendation Clinical Impression  Pt seen for ongoing cognitive treatment. Pt had visitor in room but pt continued keeping his eyes closed and responded once. Despite maximal interaction and encouragement by this Clinical research associate and friend, pt was not able to participate in cognitive therapy. Over the course of skilled ST pt has demonstrated inconsistent participation with no progress towards current goals. Pt is able to communicate his wants/needs. At this time, ST will sign off and defer further dysphagia and cognitive intervention to next venue of care.   HPI HPI: Wyatt Ball. is a 60 y.o. 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 Pt intubated from 06/06/2020 thru 06/19/2020, PEG placed 07/03/2020.      SLP Plan  Discharge SLP treatment due to (comment) (fluctuating participation)       Recommendations  Diet recommendations: Thin liquid;Dysphagia 1 (puree) Liquids provided via: Straw Medication Administration: Crushed with puree Supervision: Staff to assist with self feeding;Full supervision/cueing for compensatory strategies Compensations: Minimize environmental distractions;Slow rate;Small sips/bites Postural Changes and/or Swallow Maneuvers: Seated upright 90 degrees;Upright 30-60 min after meal                Oral Care Recommendations: Oral care BID Follow up Recommendations:  Skilled Nursing facility SLP Visit Diagnosis: Cognitive communication deficit (R41.841);Dysphagia, oropharyngeal phase (R13.12) Plan: Discharge SLP treatment due to (comment) (fluctuating participation)       GO               Kaila Devries B. Dreama Saa M.S., CCC-SLP, [facility] Speech-Language Pathologist Rehabilitation Services Office [phone]  Jessilyn Catino Dreama Saa 07/19/2020, 1:19 PM

## 2020-07-19 NOTE — Progress Notes (Signed)
PROGRESS NOTE    Wyatt Ball.  RDE:[id number] DOB: [birth year] DOA: 06/04/2020 PCP: System, Provider Not In     Chief Complaint  Patient presents with   Altered Mental Status    Brief Narrative:   HTN, type II DM, PTSD, migraine, neurogenic bladder, remote history of transverse myelitis, GERD.  Presents with complaints of confusion found to have septic shock, unknown organism with acute kidney injury, COPD exacerbation requiring intubation and ICU stay. 5/21 admitted for sepsis and pneumonia. 6/2 transferred to ICU, intubated underwent bronchoscopy 6/3 self extubated followed by reintubated 6/7 HD started 6/15 extubated 6/19 transferred to [facility]. Neurology, nephrology, urology and infectious disease consulted. 6/24 Recurrent Fevers, treated with short course of antibiotics. 6/26 Right PICC line placed. 6/27 HD stopped.  Goals of care discussed with palliative team 6/29 EGD and G Tube placed for persistent dysphagia.   Currently plan is arrange safe discharge plan to SNF. Pt seen and examined at bedside.  No new complaints today  Assessment & Plan:   Principal Problem:   CAP (community acquired pneumonia) Active Problems:   Acute metabolic encephalopathy   Severe sepsis with septic shock (HCC)   Neurogenic bladder   Chronic, continuous use of opioids   Chronic low back pain   Type 2 diabetes mellitus without complication (HCC)   Migraines   Recurrent UTI   History of colon polyps   Urinary retention   Altered mental status   Pressure injury of skin   AKI (acute kidney injury) ([place])   Malnutrition of moderate degree   Acute metabolic encephalopathy Secondary to anoxic brain injury Urology consulted, suggested gradual improvement over time. Metabolic work-up so far negative. EEG is negative. He is alert and oriented, non focal.    Acute kidney injury associated with hypernatremia hyperkalemia and hyperphosphatemia Probably secondary to ATN with  underlying diabetic nephropathy Patient was on dialysis temporarily and nephrology believed that patient is not a good candidate for outpatient HD. Patient and family does not want to pursue further hemodialysis if renal function worsens. Repeat blood work ordered today   Chronic pain syndrome Resume home medications    Type 2 diabetes mellitus with renal complications Continue with sliding scale insulin A1c 7.4 Insulin-dependent. CBG (last 3)  Recent Labs    07/19/20 0344 07/19/20 0749 07/19/20 1228  GLUCAP 163* 141* 128*        Essential hypertension Blood pressure parameters appear to be optimal at this time.   Anemia of chronic disease Transfuse to keep hemoglobin greater than 7. Recheck labs in am.    Septic shock secondary to community-acquired pneumonia present on admission Acute severe hypoxic respiratory failure in the setting of COPD exacerbation Resolved at this time.  Complete the course of IV antibiotics.  He was initially intubated in NICU later on self extubated.   Related protein calorie malnutrition S/p PEG placement and on tube feeds at this time.  Pressure injury present on admission Pressure Injury 06/23/20 Coccyx Posterior;Mid Stage 2 -  Partial thickness loss of dermis presenting as a shallow open injury with a red, pink wound bed without slough. (Active)  06/23/20 1400  Location: Coccyx  Location Orientation: Posterior;Mid  Staging: Stage 2 -  Partial thickness loss of dermis presenting as a shallow open injury with a red, pink wound bed without slough.  Wound Description (Comments):   Present on Admission: Yes   Foam dressing at present.   Hypercalcemia Unclear etiology will get PTH levels   SLP evaluation recommending dysphagia  1 diet   Anemia of chronic disease Hemoglobin around 8 and stable. Transfuse to keep hemoglobin greater than 7.   DVT prophylaxis: Heparin Code Status: Full code  Family Communication: None at  bedside  disposition:   Status is: Inpatient  Remains inpatient appropriate because:Unsafe d/c plan  Dispo: The patient is from: Home              Anticipated d/c is to: SNF              Patient currently is medically stable to d/c.   Difficult to place patient Yes       Consultants:  Neurology Nephrology     Subjective: No new complaints  Objective: Vitals:   07/19/20 0332 07/19/20 0342 07/19/20 0740 07/19/20 1248  BP:  (!) 147/63 131/69 128/72  Pulse:  76 80 72  Resp:  _0 Temp:  99.5 F (37.5 C) 98 F (36.7 C) 98.5 F (36.9 C)  TempSrc:      SpO2:  98% 99% 95%  Weight: 85.1 kg     Height:        Intake/Output Summary (Last 24 hours) at 07/19/2020 1333 Last data filed at 07/19/2020 0930 Gross per 24 hour  Intake 1830 ml  Output 6320 ml  Net -4490 ml    Filed Weights   07/15/20 0500 07/18/20 0451 07/19/20 0332  Weight: 81.7 kg 82.6 kg 85.1 kg    Examination:  General exam: Well-developed gentleman not in any kind of distress Respiratory system: a air entry fair bilateral, no wheezing or rhonchi Cardiovascular system: S1-S2 heard, regular rate rhythm, no JVD, no pedal edema Gastrointestinal system: Abdomen is soft, nontender nondistended bowel sounds normal, s/p PEG placement Central nervous system: Alert and answering questions appropriately Extremities: No pedal edema Skin: no rashes.  Psychiatry: Mood appropriate.     Data Reviewed: I have personally reviewed following labs and imaging studies  CBC: Recent Labs  Lab 07/19/20 1126  WBC 9.9  HGB 8.0*  HCT 23.9*  MCV 90.2  PLT 310     Basic Metabolic Panel: Recent Labs  Lab 07/13/20 0607 07/14/20 0654 07/15/20 0540 07/16/20 0440 07/19/20 1126  NA 134* 137 134* 132* 134*  K 3.8 3.9 3.9 3.7 3.5  CL 97* 100 97* 96* 96*  CO2 _1 GLUCOSE 173* 154* 188* 158* 136*  BUN 106* 113* 88* 122* 94*  CREATININE 2.75* 2.85* 2.89* 2.71* 2.60*  CALCIUM 11.0* 11.6* 11.0*  11.5* 11.2*     GFR: Estimated Creatinine Clearance: 30.5 mL/min (A) (by C-G formula based on SCr of 2.6 mg/dL (H)).  Liver Function Tests: No results for input(s): AST, ALT, ALKPHOS, BILITOT, PROT, ALBUMIN in the last 168 hours.  CBG: Recent Labs  Lab 07/18/20 2122 07/19/20 0011 07/19/20 0344 07/19/20 0749 07/19/20 1228  GLUCAP 217* 177* 163* 141* 128*      No results found for this or any previous visit (from the past 240 hour(s)).       Radiology Studies: No results found.      Scheduled Meds:  amLODipine  10 mg Per Tube Daily   chlorhexidine gluconate (MEDLINE KIT)  15 mL Mouth Rinse BID   Chlorhexidine Gluconate Cloth  6 each Topical Daily   cloNIDine  0.3 mg Transdermal Weekly   feeding supplement (PROSource TF)  45 mL Per Tube Daily   free water  125 mL Per Tube Q4H   gabapentin  100 mg Per Tube  Q8H   heparin injection (subcutaneous)  5,000 Units Subcutaneous Q8H   insulin aspart  0-20 Units Subcutaneous Q4H   insulin aspart  3 Units Subcutaneous Q4H   insulin glargine  20 Units Subcutaneous Daily   metoprolol tartrate  100 mg Per Tube BID   multivitamin  1 tablet Per Tube QHS   nutrition supplement (JUVEN)  1 packet Per Tube BID   pantoprazole sodium  40 mg Per Tube Daily   prazosin  4 mg Per Tube QHS   QUEtiapine  25 mg Per Tube BID   sodium chloride flush  10-40 mL Intracatheter Q12H   traZODone  150 mg Per Tube QHS   Continuous Infusions:  sodium chloride Stopped (06/22/20 1614)   feeding supplement (NEPRO CARB STEADY) 40 mL/hr at 07/18/20 1800     LOS: 41 days        Hosie Poisson, MD [facility]   To contact the attending provider between 7A-7P or the covering provider during after hours 7P-7A, please log into the web site [email or web address] and access using universal [place] password for that web site. If you do not have the password, please call the hospital operator.  07/19/2020, 1:33 PM

## 2020-07-19 NOTE — Progress Notes (Signed)
Occupational Therapy Treatment Patient Details Name: Wyatt Ball. MRN: [id number] DOB: 27-Dec-1960 Today's Date: 07/19/2020    History of present illness Wyatt Ball. is a 60 y.o. 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  Since hospitalization pt has been intubated, finally extubated 6/15.   OT comments  Wyatt Ball was seen for OT/PT co-treatment on this date. Upon arrival to room pt in bed with PT at bedside. Pt requires MOD A x2 to exit L side of bed. Poor sitting balance, requires MIN - MOD A to maintain static sitting including cues for head positioning. MAX A x2 sit<>stand and TOTAL A bed>chair. SETUP self-drinking reclined in chair. Pt making good progress toward goals. Pt continues to benefit from skilled OT services to maximize return to PLOF and minimize risk of future falls, injury, caregiver burden, and readmission. Will continue to follow POC. Discharge recommendation remains appropriate.    Follow Up Recommendations  SNF    Equipment Recommendations   (TBD)    Recommendations for Other Services      Precautions / Restrictions Precautions Precautions: Fall Restrictions Weight Bearing Restrictions: No       Mobility Bed Mobility Overal bed mobility: Needs Assistance Bed Mobility: Supine to Sit     Supine to sit: Mod assist;+2 for physical assistance;HOB elevated          Transfers Overall transfer level: Needs assistance   Transfers: Sit to/from Stand;Squat Pivot Transfers Sit to Stand: Max assist;+2 physical assistance   Squat pivot transfers: Total assist;+2 physical assistance           Balance Overall balance assessment: Needs assistance Sitting-balance support: Bilateral upper extremity supported;Feet supported Sitting balance-Leahy Scale: Poor Sitting balance - Comments: anterior and lateral leans   Standing balance support: Bilateral upper extremity supported Standing balance-Leahy Scale: Poor                             ADL either performed or assessed with clinical judgement   ADL Overall ADL's : Needs assistance/impaired                                       General ADL Comments: MIN A static sitting balance. SETUP self-drinking reclined in chair. MAX A don B socks seated EOB      Cognition Arousal/Alertness: Awake/alert Behavior During Therapy: WFL for tasks assessed/performed Overall Cognitive Status: History of cognitive impairments - at baseline Area of Impairment: Orientation;Following commands;Safety/judgement;Awareness;Problem solving                       Following Commands: Follows one step commands inconsistently Safety/Judgement: Decreased awareness of safety;Decreased awareness of deficits   Problem Solving: Slow processing;Decreased initiation;Difficulty sequencing;Requires verbal cues;Requires tactile cues General Comments: Pt was long sitting in bed with neice at bedside. He agrees to session and OOB activity.        Exercises Exercises: Other exercises Other Exercises Other Exercises: Pt and family educated re; OT role, DME recs, d/c recs Other Exercises: LBD, self-drinking, sup>sit, sit<>stand, SPT, sitting  balnace/tolerance           Pertinent Vitals/ Pain       Pain Assessment: Faces Pain Score: 4  Faces Pain Scale: Hurts a little bit Pain Location: chronic back pain Pain Descriptors / Indicators: Aching Pain Intervention(s): Limited activity within patient's tolerance         Frequency  Min 1X/week        Progress Toward Goals  OT Goals(current goals can now be found in  the care plan section)  Progress towards OT goals: Progressing toward goals  Acute Rehab OT Goals Patient Stated Goal: none stated OT Goal Formulation: With patient Time For Goal Achievement: 07/31/20 Potential to Achieve Goals: Fair ADL Goals Pt Will Perform Grooming: with set-up;with supervision;bed level Pt Will Perform Upper Body Dressing: with min assist;sitting Pt Will Transfer to Toilet: with mod assist;with +2 assist  Plan Frequency remains appropriate;Discharge plan remains appropriate    Co-evaluation    PT/OT/SLP Co-Evaluation/Treatment: Yes Reason for Co-Treatment: For patient/therapist safety;To address functional/ADL transfers PT goals addressed during session: Mobility/safety with mobility OT goals addressed during session: ADL's and self-care      AM-PAC OT "6 Clicks" Daily Activity     Outcome Measure   Help from another person eating meals?: A Little Help from another person taking care of personal grooming?: A Little Help from another person toileting, which includes using toliet, bedpan, or urinal?: A Lot Help from another person bathing (including washing, rinsing, drying)?: A Lot Help from another person to put on and taking off regular upper body clothing?: A Lot Help from another person to put on and taking off regular lower body clothing?: A Lot 6 Click Score: 14    End of Session    OT Visit Diagnosis: Other symptoms and signs involving cognitive function   Activity Tolerance Patient tolerated treatment well   Patient Left in chair;with call bell/phone within reach;with family/visitor present   Nurse Communication          Time: 9150-5697 OT Time Calculation (min): 16 min  Charges: OT General Charges $OT Visit: 1 Visit OT Treatments $Self Care/Home Management : 8-22 mins  Kathie Dike, M.S. OTR/L  07/19/20, 4:22 PM  ascom [phone]

## 2020-07-19 NOTE — Progress Notes (Signed)
Physical Therapy Treatment Patient Details Name: Wyatt Ball. MRN: [id number] DOB: 05/22/1960 Today's Date: 07/19/2020    History of Present Illness Waldemar Falero Montez Hageman. is a 60 y.o. 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  Since hospitalization pt has been intubated, finally extubated 6/15.    PT Comments    Pt was long sitting in bed upon arriving. Agrees to PT/OT co-treat. Required +2 assistance for safety. Pt's supportive niece was present throughout most of day today and was able to answer questions about pt's PLOF. Pt is not at baseline and will continue to benefit form skilled PT to assist him with returning to PLOF. Pt was able to stand 2 x EOB and stand pivot 1 x to recliner. +2 max assist to stand with BUE support on RW, total assist to stand pivot to recliner with author blocking knees from buckling. Highly recommend RN staff only use mechanical lift for getting pt out/in bed. Pt will benefit from rehab at DC to address deficits while maximizing independence with ADLs.    Follow Up Recommendations  SNF     Equipment Recommendations  Other (comment) (defer to next level of care)       Precautions / Restrictions Precautions Precautions: Fall Restrictions Weight Bearing Restrictions: No    Mobility  Bed Mobility Overal bed mobility: Needs Assistance Bed Mobility: Supine to Sit;Sit to Supine;Rolling     Supine to sit: +2 for physical assistance;Mod assist Sit to supine: Max assist   General bed mobility comments: pt was able to roll L to short sit with increased time and +2 assistance. pt was able to progress BLEs to EOB  while in side lying.    Transfers Overall transfer level: Needs assistance Equipment used: Rolling walker (2 wheeled) Transfers: Sit to/from Stand Sit to Stand: Max assist;+2 physical assistance   Squat pivot transfers: Total assist;+2 physical assistance     General transfer comment: Stand pivot to recliner from EOB.  Ambulation/Gait    General Gait Details: unable/unsafe      Balance Overall balance assessment: Needs assistance Sitting-balance support: Bilateral upper extremity supported;Feet supported Sitting balance-Leahy Scale: Poor Sitting balance - Comments: anterior and lateral leans   Standing balance support: Bilateral upper extremity supported Standing balance-Leahy Scale: Zero Standing balance comment: needs total assist in standing to maintain balance.        Cognition Arousal/Alertness: Awake/alert Behavior During Therapy: WFL for tasks assessed/performed Overall Cognitive Status: History of cognitive impairments - at baseline Area of Impairment: Orientation;Following commands;Safety/judgement;Awareness;Problem solving      Following Commands: Follows one step commands inconsistently Safety/Judgement: Decreased awareness of safety;Decreased awareness of deficits   Problem Solving: Slow processing;Decreased initiation;Difficulty sequencing;Requires verbal cues;Requires tactile cues General Comments: Pt was long sitting in bed with neice at bedside. He agrees to session and OOB activity.      Exercises Other Exercises Other Exercises: Pt and family educated re; OT role, DME recs, d/c recs Other Exercises: LBD, self-drinking, sup>sit, sit<>stand, SPT, sitting balnace/tolerance        Pertinent Vitals/Pain Pain Assessment: Faces Pain Score: 4  Faces Pain Scale: Hurts a little bit Pain Location: chronic back pain Pain Descriptors / Indicators: Aching Pain  Intervention(s): Limited activity within patient's tolerance     PT Goals (current goals can  now be found in the care plan section) Acute Rehab PT Goals Patient Stated Goal: none stated Progress towards PT goals: Progressing toward goals    Frequency    Min 2X/week      PT Plan Current plan remains appropriate    Co-evaluation PT/OT/SLP Co-Evaluation/Treatment: Yes Reason for Co-Treatment: For patient/therapist safety;To address functional/ADL transfers PT goals addressed during session: Mobility/safety with mobility OT goals addressed during session: ADL's and self-care      AM-PAC PT "6 Clicks" Mobility   Outcome Measure  Help needed turning from your back to your side while in a flat bed without using bedrails?: A Lot Help needed moving from lying on your back to sitting on the side of a flat bed without using bedrails?: A Lot Help needed moving to and from a bed to a chair (including a wheelchair)?: Total Help needed standing up from a chair using your arms (e.g., wheelchair or bedside chair)?: Total Help needed to walk in hospital room?: Total Help needed climbing 3-5 steps with a railing? : Total 6 Click Score: (P) 8    End of Session Equipment Utilized During Treatment: Gait belt Activity Tolerance: Patient tolerated treatment well Patient left: in chair;with call bell/phone within reach;with chair alarm set Nurse Communication: Mobility status PT Visit Diagnosis: Muscle weakness (generalized) (M62.81);Adult, failure to thrive (R62.7);Pain     Time: 6203-5597 PT Time Calculation (min) (ACUTE ONLY): 30 min  Charges:  $Therapeutic Activity: 8-22 mins                     Jetta Lout PTA 07/19/20, 4:55 PM

## 2020-07-19 NOTE — Progress Notes (Signed)
   07/19/20 1550  Clinical Encounter Type  Visited With Patient  Visit Type Initial;Spiritual support;Social support  Referral From Nurse  Consult/Referral To Chaplain   Posey Boyer responded to a request from a PT to have an AD completed. PT was present with his best friend, and she was overcomed with grief. She stated she felt very overwhelmed and had previously done a DNR. He stated he wanted to have that change with an AD, given his best friend and brother the final says over his resuscitation. They will complete the form and request a Chaplain to have it notarized on Monday.

## 2020-07-19 NOTE — TOC Progression Note (Signed)
Transition of Care (TOC) - Progression Note    Patient Details  Name: Yeudiel Mateo. MRN: [id number] Date of Birth: [birth year]  [facility] of [facility]) CM/SW Contact  Caryn Section, RN Phone Number: 07/19/2020, 4:16 PM  Clinical Narrative:   RNCM left message for Tiffany at [facility] for update on [place] authorization, awaiting response as of this time.    Expected Discharge Plan: Skilled Nursing Facility Barriers to Discharge: Continued Medical Work up  Expected Discharge Plan and Services Expected Discharge Plan: Skilled Nursing Facility In-house Referral: NA   Post Acute Care Choice: Skilled Nursing Facility Living arrangements for the past 2 months: Single Family Home                                       Social Determinants of Health (SDOH) Interventions    Readmission Risk Interventions No flowsheet data found.

## 2020-07-20 DIAGNOSIS — J189 Pneumonia, unspecified organism: Secondary | ICD-10-CM | POA: Diagnosis not present

## 2020-07-20 DIAGNOSIS — G9341 Metabolic encephalopathy: Secondary | ICD-10-CM | POA: Diagnosis not present

## 2020-07-20 DIAGNOSIS — N179 Acute kidney failure, unspecified: Secondary | ICD-10-CM | POA: Diagnosis not present

## 2020-07-20 LAB — GLUCOSE, CAPILLARY
Glucose-Capillary: 115 mg/dL — ABNORMAL HIGH (ref 70–99)
Glucose-Capillary: 125 mg/dL — ABNORMAL HIGH (ref 70–99)
Glucose-Capillary: 127 mg/dL — ABNORMAL HIGH (ref 70–99)
Glucose-Capillary: 178 mg/dL — ABNORMAL HIGH (ref 70–99)
Glucose-Capillary: 186 mg/dL — ABNORMAL HIGH (ref 70–99)
Glucose-Capillary: 186 mg/dL — ABNORMAL HIGH (ref 70–99)

## 2020-07-20 LAB — PHOSPHORUS: Phosphorus: 4.6 mg/dL (ref 2.5–4.6)

## 2020-07-20 MED ORDER — NEPRO/CARBSTEADY PO LIQD
1000.0000 mL | ORAL | Status: DC
  Administered 2020-07-20: 16:00:00 1000 mL

## 2020-07-20 MED ORDER — ALUM & MAG HYDROXIDE-SIMETH 200-200-20 MG/5ML PO SUSP
15.0000 mL | Freq: Four times a day (QID) | ORAL | Status: DC | PRN
  Administered 2020-08-01: 20:00:00 15 mL via ORAL
  Filled 2020-07-20: qty 30

## 2020-07-20 NOTE — Progress Notes (Signed)
RN found PICC patient's line lying on bedside table. The patient had removed it apparently within the previous hour. Upon checking notes, it was determined that 38cm was placed by IV team on 6/3 and 38cm was removed by the patient on 7/16. MD was notified and is aware.  -nothing follows

## 2020-07-20 NOTE — Progress Notes (Signed)
PROGRESS NOTE    Wyatt Ball [employer].  WER:[id number] DOB: [birth year] DOA: 06/04/2020 PCP: System, Provider Not In     Chief Complaint  Patient presents with   Altered Mental Status    Brief Narrative:   HTN, type II DM, PTSD, migraine, neurogenic bladder, remote history of transverse myelitis, GERD.  Presents with complaints of confusion found to have septic shock, unknown organism with acute kidney injury, COPD exacerbation requiring intubation and ICU stay. 5/21 admitted for sepsis and pneumonia. 6/2 transferred to ICU, intubated underwent bronchoscopy 6/3 self extubated followed by reintubated 6/7 HD started 6/15 extubated 6/19 transferred to [facility]. Neurology, nephrology, urology and infectious disease consulted. 6/24 Recurrent Fevers, treated with short course of antibiotics. 6/26 Right PICC line placed. 6/27 HD stopped.  Goals of care discussed with palliative team 6/29 EGD and G Tube placed for persistent dysphagia.   Currently plan is arrange safe discharge plan to SNF. Pt seen and examined at bedside.  He was trying to eat lunch.   Assessment & Plan:   Principal Problem:   CAP (community acquired pneumonia) Active Problems:   Acute metabolic encephalopathy   Severe sepsis with septic shock (HCC)   Neurogenic bladder   Chronic, continuous use of opioids   Chronic low back pain   Type 2 diabetes mellitus without complication (HCC)   Migraines   Recurrent UTI   History of colon polyps   Urinary retention   Altered mental status   Pressure injury of skin   AKI (acute kidney injury) ([place])   Malnutrition of moderate degree   Acute metabolic encephalopathy Secondary to anoxic brain injury Urology consulted, suggested gradual improvement over time. Metabolic work-up so far negative. EEG is negative. He is alert and oriented and answering all questions appropriately.  As per RN pt was eating chick fil A sandwich yesterday.    Acute kidney injury associated  with hypernatremia hyperkalemia and hyperphosphatemia Probably secondary to ATN with underlying diabetic nephropathy Patient was on dialysis temporarily and nephrology believed that patient is not a good candidate for outpatient HD. Patient and family does not want to pursue further hemodialysis if renal function worsens.   Chronic pain syndrome Resume home medications    Type 2 diabetes mellitus with renal complications Continue with sliding scale insulin A1c 7.4 Insulin-dependent. CBG (last 3)  Recent Labs    07/20/20 0418 07/20/20 0747 07/20/20 1145  GLUCAP 127* 186* 125*        Essential hypertension Blood pressure parameters appear to be optimal at this time.   Anemia of chronic disease Transfuse to keep hemoglobin greater than 7. Recheck labs in am.    Septic shock secondary to community-acquired pneumonia present on admission Acute severe hypoxic respiratory failure in the setting of COPD exacerbation Resolved at this time.  Complete the course of IV antibiotics.  He was initially intubated in NICU later on self extubated.   Related protein calorie malnutrition S/p PEG placement and on tube feeds at this time.  Pressure injury present on admission Pressure Injury 06/23/20 Coccyx Posterior;Mid Stage 2 -  Partial thickness loss of dermis presenting as a shallow open injury with a red, pink wound bed without slough. (Active)  06/23/20 1400  Location: Coccyx  Location Orientation: Posterior;Mid  Staging: Stage 2 -  Partial thickness loss of dermis presenting as a shallow open injury with a red, pink wound bed without slough.  Wound Description (Comments):   Present on Admission: Yes   Foam dressing at present.  Hypercalcemia Unclear etiology will get PTH levels.   SLP evaluation recommending dysphagia 1 diet   Anemia of chronic disease Hemoglobin around 8 and stable. Transfuse to keep hemoglobin greater than 7.    Pressure injury present on  admission.  Pressure Injury 06/23/20 Coccyx Posterior;Mid Stage 2 -  Partial thickness loss of dermis presenting as a shallow open injury with a red, pink wound bed without slough. (Active)  06/23/20 1400  Location: Coccyx  Location Orientation: Posterior;Mid  Staging: Stage 2 -  Partial thickness loss of dermis presenting as a shallow open injury with a red, pink wound bed without slough.  Wound Description (Comments):   Present on Admission: Yes         DVT prophylaxis: Heparin Code Status: Full code  Family Communication: None at bedside  disposition:   Status is: Inpatient  Remains inpatient appropriate because:Unsafe d/c plan  Dispo: The patient is from: Home              Anticipated d/c is to: SNF              Patient currently is medically stable to d/c.   Difficult to place patient Yes       Consultants:  Neurology Nephrology     Subjective: No new complaints.   Objective: Vitals:   07/20/20 0043 07/20/20 0435 07/20/20 0745 07/20/20 1139  BP: (!) 123/58 137/83 130/85 (!) 127/58  Pulse: 75 86 86 71  Resp: 16 16 16 17   Temp: 98.3 F (36.8 C) 98.4 F (36.9 C) 98.7 F (37.1 C) 99.2 F (37.3 C)  TempSrc:  Oral    SpO2: 99% 100% 100% 100%  Weight:      Height:        Intake/Output Summary (Last 24 hours) at 07/20/2020 1328 Last data filed at 07/20/2020 1007 Gross per 24 hour  Intake 2040 ml  Output 2996 ml  Net -956 ml    Filed Weights   07/15/20 0500 07/18/20 0451 07/19/20 0332  Weight: 81.7 kg 82.6 kg 85.1 kg    Examination:  General exam: alert and comfortable.  Respiratory system: clear to auscultation, no wheezing heard.  Cardiovascular system: S1-S2 heard, RRR, no JVD, no pedal edema.  Gastrointestinal system: Abdomen is soft, non tender non distended, bowel sounds wnl. , s/p PEG placement Central nervous system: Alert and answering questions appropriately.  Extremities: No pedal edema Skin: stage 2 coccyx pressure injury   Psychiatry: Mood appropriate.     Data Reviewed: I have personally reviewed following labs and imaging studies  CBC: Recent Labs  Lab 07/19/20 1126  WBC 9.9  HGB 8.0*  HCT 23.9*  MCV 90.2  PLT 310     Basic Metabolic Panel: Recent Labs  Lab 07/14/20 0654 07/15/20 0540 07/16/20 0440 07/19/20 1126 07/20/20 0632  NA 137 134* 132* 134*  --   K 3.9 3.9 3.7 3.5  --   CL 100 97* 96* 96*  --   CO2 31 29 29 30   --   GLUCOSE 154* 188* 158* 136*  --   BUN 113* 88* 122* 94*  --   CREATININE 2.85* 2.89* 2.71* 2.60*  --   CALCIUM 11.6* 11.0* 11.5* 11.2*  --   PHOS  --   --   --   --  4.6     GFR: Estimated Creatinine Clearance: 30.5 mL/min (A) (by C-G formula based on SCr of 2.6 mg/dL (H)).  Liver Function Tests: No results for input(s): AST, ALT,  ALKPHOS, BILITOT, PROT, ALBUMIN in the last 168 hours.  CBG: Recent Labs  Lab 07/19/20 2024 07/20/20 0044 07/20/20 0418 07/20/20 0747 07/20/20 1145  GLUCAP 128* 115* 127* 186* 125*      No results found for this or any previous visit (from the past 240 hour(s)).       Radiology Studies: No results found.      Scheduled Meds:  amLODipine  10 mg Per Tube Daily   chlorhexidine gluconate (MEDLINE KIT)  15 mL Mouth Rinse BID   Chlorhexidine Gluconate Cloth  6 each Topical Daily   cloNIDine  0.3 mg Transdermal Weekly   feeding supplement (PROSource TF)  45 mL Per Tube Daily   free water  125 mL Per Tube Q4H   gabapentin  100 mg Per Tube Q8H   heparin injection (subcutaneous)  5,000 Units Subcutaneous Q8H   insulin aspart  0-20 Units Subcutaneous Q4H   insulin aspart  3 Units Subcutaneous Q4H   insulin glargine  20 Units Subcutaneous Daily   metoprolol tartrate  100 mg Per Tube BID   multivitamin  1 tablet Per Tube QHS   nutrition supplement (JUVEN)  1 packet Per Tube BID   pantoprazole sodium  40 mg Per Tube Daily   prazosin  4 mg Per Tube QHS   QUEtiapine  25 mg Per Tube BID   sodium chloride flush   10-40 mL Intracatheter Q12H   traZODone  150 mg Per Tube QHS   Continuous Infusions:  sodium chloride Stopped (06/22/20 1614)   feeding supplement (NEPRO CARB STEADY) 1,000 mL (07/19/20 1800)     LOS: 46 days        Hosie Poisson, MD [facility]   To contact the attending provider between 7A-7P or the covering provider during after hours 7P-7A, please log into the web site [email or web address] and access using universal [place] password for that web site. If you do not have the password, please call the hospital operator.  07/20/2020, 1:28 PM

## 2020-07-21 DIAGNOSIS — N179 Acute kidney failure, unspecified: Secondary | ICD-10-CM | POA: Diagnosis not present

## 2020-07-21 DIAGNOSIS — J189 Pneumonia, unspecified organism: Secondary | ICD-10-CM | POA: Diagnosis not present

## 2020-07-21 LAB — GLUCOSE, CAPILLARY
Glucose-Capillary: 123 mg/dL — ABNORMAL HIGH (ref 70–99)
Glucose-Capillary: 136 mg/dL — ABNORMAL HIGH (ref 70–99)
Glucose-Capillary: 143 mg/dL — ABNORMAL HIGH (ref 70–99)
Glucose-Capillary: 155 mg/dL — ABNORMAL HIGH (ref 70–99)
Glucose-Capillary: 172 mg/dL — ABNORMAL HIGH (ref 70–99)
Glucose-Capillary: 183 mg/dL — ABNORMAL HIGH (ref 70–99)

## 2020-07-21 MED ORDER — NEPRO/CARBSTEADY PO LIQD
1000.0000 mL | ORAL | Status: DC
  Administered 2020-07-21: 11:00:00 1000 mL

## 2020-07-21 NOTE — Progress Notes (Signed)
PROGRESS NOTE    Wyatt Ball.  ZOX:[id number] DOB: 06-May-1960 DOA: 06/04/2020 PCP: System, Provider Not In     Chief Complaint  Patient presents with   Altered Mental Status    Brief Narrative:   HTN, type II DM, PTSD, migraine, neurogenic bladder, remote history of transverse myelitis, GERD.  Presents with complaints of confusion found to have septic shock, unknown organism with acute kidney injury, COPD exacerbation requiring intubation and ICU stay. 5/21 admitted for sepsis and pneumonia. 6/2 transferred to ICU, intubated underwent bronchoscopy 6/3 self extubated followed by reintubated 6/7 HD started 6/15 extubated 6/19 transferred to [facility]. Neurology, nephrology, urology and infectious disease consulted. 6/24 Recurrent Fevers, treated with short course of antibiotics. 6/26 Right PICC line placed. 6/27 HD stopped.  Goals of care discussed with palliative team 6/29 EGD and G Tube placed for persistent dysphagia.   Currently plan is arrange safe discharge plan to SNF. Pt seen and examined at bedside. No new complaints. Wants fresh fruit and doe s not want to eat pureed food.   Assessment & Plan:   Principal Problem:   CAP (community acquired pneumonia) Active Problems:   Acute metabolic encephalopathy   Severe sepsis with septic shock (HCC)   Neurogenic bladder   Chronic, continuous use of opioids   Chronic low back pain   Type 2 diabetes mellitus without complication (HCC)   Migraines   Recurrent UTI   History of colon polyps   Urinary retention   Altered mental status   Pressure injury of skin   AKI (acute kidney injury) ([place])   Malnutrition of moderate degree   Acute metabolic encephalopathy Secondary to anoxic brain injury Urology consulted, suggested gradual improvement over time. Metabolic work-up so far negative. EEG is negative. He is alert and oriented and answering all questions appropriately.  As per RN pt was eating chick fil A sandwich  yesterday.    Acute kidney injury associated with hypernatremia hyperkalemia and hyperphosphatemia Probably secondary to ATN with underlying diabetic nephropathy Patient was on dialysis temporarily and nephrology believed that patient is not a good candidate for outpatient HD. Patient and family does not want to pursue further hemodialysis if renal function worsens.   Chronic pain syndrome Resume home medications    Type 2 diabetes mellitus with renal complications Continue with sliding scale insulin A1c 7.4 Insulin-dependent. CBG (last 3)  Recent Labs    07/21/20 0013 07/21/20 0412 07/21/20 0836  GLUCAP 183* 143* 123*   No changes in meds.      Essential hypertension Blood pressure parameters are optimal.    Anemia of chronic disease Transfuse to keep hemoglobin greater than 7.    Septic shock secondary to community-acquired pneumonia present on admission Acute severe hypoxic respiratory failure in the setting of COPD exacerbation Resolved at this time.  Complete the course of IV antibiotics.  He was initially intubated in NICU later on self extubated.     Pressure injury present on admission Pressure Injury 06/23/20 Coccyx Posterior;Mid Stage 2 -  Partial thickness loss of dermis presenting as a shallow open injury with a red, pink wound bed without slough. (Active)  06/23/20 1400  Location: Coccyx  Location Orientation: Posterior;Mid  Staging: Stage 2 -  Partial thickness loss of dermis presenting as a shallow open injury with a red, pink wound bed without slough.  Wound Description (Comments):   Present on Admission: Yes   Foam dressing at present.   Hypercalcemia Asymptomatic.  Unclear etiology will get PTH levels.  Repeat labs in am.       Anemia of chronic disease Hemoglobin around 8 and stable. Transfuse to keep hemoglobin greater than 7.    Pressure injury present on admission.  Pressure Injury 06/23/20 Coccyx Posterior;Mid Stage 2 -   Partial thickness loss of dermis presenting as a shallow open injury with a red, pink wound bed without slough. (Active)  06/23/20 1400  Location: Coccyx  Location Orientation: Posterior;Mid  Staging: Stage 2 -  Partial thickness loss of dermis presenting as a shallow open injury with a red, pink wound bed without slough.  Wound Description (Comments):   Present on Admission: Yes    Severe Protein Calorie Malnutrition:  Pt is on tube feeds, but he is asking for chunky veges and fruits.  Will request SLP eval and dietary to advance his diet .  Will start decreasing the tube feeds to 20 ml/hr.     DVT prophylaxis: Heparin Code Status: Full code  Family Communication: None at bedside  disposition:   Status is: Inpatient  Remains inpatient appropriate because:Unsafe d/c plan  Dispo: The patient is from: Home              Anticipated d/c is to: SNF              Patient currently is medically stable to d/c.   Difficult to place patient Yes       Consultants:  Neurology Nephrology     Subjective: No new complaints.   Objective: Vitals:   07/21/20 0031 07/21/20 0430 07/21/20 0500 07/21/20 0816  BP: (!) 120/57 (!) 94/58  (!) 142/94  Pulse: 69 66  82  Resp: 16 16  18   Temp: 99.2 F (37.3 C) 98.9 F (37.2 C)  99.4 F (37.4 C)  TempSrc:      SpO2: 100% 97%  98%  Weight:   82.9 kg   Height:        Intake/Output Summary (Last 24 hours) at 07/21/2020 1027 Last data filed at 07/21/2020 0433 Gross per 24 hour  Intake 120 ml  Output 5425 ml  Net -5305 ml    Filed Weights   07/18/20 0451 07/19/20 0332 07/21/20 0500  Weight: 82.6 kg 85.1 kg 82.9 kg    Examination:  General exam: Alert and comfortable.  Respiratory system: Clear to auscultation no wheezing or rhonchi.  Cardiovascular system: S1-S2 heard, RRR, no JVD, no pedal edema.  Gastrointestinal system: Abdomen is soft, NT ND BS+ Central nervous system: alert and oriented, non focal.  Extremities: No  pedal edema Skin: stage 2 coccyx pressure injury  Psychiatry: Mood appropriate.     Data Reviewed: I have personally reviewed following labs and imaging studies  CBC: Recent Labs  Lab 07/19/20 1126  WBC 9.9  HGB 8.0*  HCT 23.9*  MCV 90.2  PLT 310     Basic Metabolic Panel: Recent Labs  Lab 07/15/20 0540 07/16/20 0440 07/19/20 1126 07/20/20 0632  NA 134* 132* 134*  --   K 3.9 3.7 3.5  --   CL 97* 96* 96*  --   CO2 29 29 30   --   GLUCOSE 188* 158* 136*  --   BUN 88* 122* 94*  --   CREATININE 2.89* 2.71* 2.60*  --   CALCIUM 11.0* 11.5* 11.2*  --   PHOS  --   --   --  4.6     GFR: Estimated Creatinine Clearance: 30.1 mL/min (A) (by C-G formula based on SCr of 2.6  mg/dL (H)).  Liver Function Tests: No results for input(s): AST, ALT, ALKPHOS, BILITOT, PROT, ALBUMIN in the last 168 hours.  CBG: Recent Labs  Lab 07/20/20 1653 07/20/20 2058 07/21/20 0013 07/21/20 0412 07/21/20 0836  GLUCAP 186* 178* 183* 143* 123*      No results found for this or any previous visit (from the past 240 hour(s)).       Radiology Studies: No results found.      Scheduled Meds:  amLODipine  10 mg Per Tube Daily   chlorhexidine gluconate (MEDLINE KIT)  15 mL Mouth Rinse BID   Chlorhexidine Gluconate Cloth  6 each Topical Daily   cloNIDine  0.3 mg Transdermal Weekly   feeding supplement (PROSource TF)  45 mL Per Tube Daily   free water  125 mL Per Tube Q4H   gabapentin  100 mg Per Tube Q8H   heparin injection (subcutaneous)  5,000 Units Subcutaneous Q8H   insulin aspart  0-20 Units Subcutaneous Q4H   insulin aspart  3 Units Subcutaneous Q4H   insulin glargine  20 Units Subcutaneous Daily   metoprolol tartrate  100 mg Per Tube BID   multivitamin  1 tablet Per Tube QHS   nutrition supplement (JUVEN)  1 packet Per Tube BID   pantoprazole sodium  40 mg Per Tube Daily   prazosin  4 mg Per Tube QHS   QUEtiapine  25 mg Per Tube BID   sodium chloride flush  10-40 mL  Intracatheter Q12H   traZODone  150 mg Per Tube QHS   Continuous Infusions:  sodium chloride Stopped (06/22/20 1614)   feeding supplement (NEPRO CARB STEADY) 1,000 mL (07/20/20 1624)     LOS: 21 days        Hosie Poisson, MD [facility]   To contact the attending provider between 7A-7P or the covering provider during after hours 7P-7A, please log into the web site [email or web address] and access using universal [place] password for that web site. If you do not have the password, please call the hospital operator.  07/21/2020, 10:27 AM

## 2020-07-22 DIAGNOSIS — N179 Acute kidney failure, unspecified: Secondary | ICD-10-CM | POA: Diagnosis not present

## 2020-07-22 DIAGNOSIS — J189 Pneumonia, unspecified organism: Secondary | ICD-10-CM | POA: Diagnosis not present

## 2020-07-22 LAB — PTH, INTACT AND CALCIUM
Calcium, Total (PTH): 10.9 mg/dL — ABNORMAL HIGH (ref 8.6–10.2)
PTH: 14 pg/mL — ABNORMAL LOW (ref 15–65)

## 2020-07-22 LAB — BASIC METABOLIC PANEL
Anion gap: 8 (ref 5–15)
BUN: 86 mg/dL — ABNORMAL HIGH (ref 8–23)
CO2: 29 mmol/L (ref 22–32)
Calcium: 11 mg/dL — ABNORMAL HIGH (ref 8.9–10.3)
Chloride: 95 mmol/L — ABNORMAL LOW (ref 98–111)
Creatinine, Ser: 2.7 mg/dL — ABNORMAL HIGH (ref 0.61–1.24)
GFR, Estimated: 26 mL/min — ABNORMAL LOW (ref >60)
Glucose, Bld: 112 mg/dL — ABNORMAL HIGH (ref 70–99)
Potassium: 4.2 mmol/L (ref 3.5–5.1)
Sodium: 132 mmol/L — ABNORMAL LOW (ref 135–145)

## 2020-07-22 LAB — GLUCOSE, CAPILLARY
Glucose-Capillary: 107 mg/dL — ABNORMAL HIGH (ref 70–99)
Glucose-Capillary: 149 mg/dL — ABNORMAL HIGH (ref 70–99)
Glucose-Capillary: 174 mg/dL — ABNORMAL HIGH (ref 70–99)
Glucose-Capillary: 180 mg/dL — ABNORMAL HIGH (ref 70–99)
Glucose-Capillary: 285 mg/dL — ABNORMAL HIGH (ref 70–99)
Glucose-Capillary: 81 mg/dL (ref 70–99)

## 2020-07-22 LAB — CBC
HCT: 26.3 % — ABNORMAL LOW (ref 39.0–52.0)
Hemoglobin: 8.7 g/dL — ABNORMAL LOW (ref 13.0–17.0)
MCH: 29.7 pg (ref 26.0–34.0)
MCHC: 33.1 g/dL (ref 30.0–36.0)
MCV: 89.8 fL (ref 80.0–100.0)
Platelets: 320 10*3/uL (ref 150–400)
RBC: 2.93 MIL/uL — ABNORMAL LOW (ref 4.22–5.81)
RDW: 14.1 % (ref 11.5–15.5)
WBC: 7.3 10*3/uL (ref 4.0–10.5)
nRBC: 0 % (ref 0.0–0.2)

## 2020-07-22 LAB — ACID FAST CULTURE WITH REFLEXED SENSITIVITIES (MYCOBACTERIA): Acid Fast Culture: NEGATIVE

## 2020-07-22 MED ORDER — PANTOPRAZOLE SODIUM 40 MG PO TBEC
40.0000 mg | DELAYED_RELEASE_TABLET | Freq: Every day | ORAL | Status: DC
  Administered 2020-07-23 – 2020-08-02 (×11): 40 mg via ORAL
  Filled 2020-07-22 (×11): qty 1

## 2020-07-22 MED ORDER — TRAMADOL HCL 50 MG PO TABS
100.0000 mg | ORAL_TABLET | Freq: Two times a day (BID) | ORAL | Status: DC | PRN
  Administered 2020-07-22 – 2020-07-24 (×4): 100 mg via ORAL
  Filled 2020-07-22 (×4): qty 2

## 2020-07-22 MED ORDER — SODIUM CHLORIDE 0.9 % IV SOLN: INTRAVENOUS | Status: DC

## 2020-07-22 MED ORDER — FUROSEMIDE 20 MG PO TABS
20.0000 mg | ORAL_TABLET | Freq: Once | ORAL | Status: AC
  Administered 2020-07-22: 20 mg via ORAL
  Filled 2020-07-22: qty 1

## 2020-07-22 MED ORDER — GABAPENTIN 100 MG PO CAPS
100.0000 mg | ORAL_CAPSULE | Freq: Three times a day (TID) | ORAL | Status: DC
  Administered 2020-07-22 – 2020-07-26 (×12): 100 mg via ORAL
  Filled 2020-07-22 (×12): qty 1

## 2020-07-22 NOTE — Progress Notes (Signed)
PROGRESS NOTE    Wyatt Ball.  JGO:[id number] DOB: 1960/12/02 DOA: 06/04/2020 PCP: System, Provider Not In     Chief Complaint  Patient presents with   Altered Mental Status    Brief Narrative:   HTN, type II DM, PTSD, migraine, neurogenic bladder, remote history of transverse myelitis, GERD.  Presents with complaints of confusion found to have septic shock, unknown organism with acute kidney injury, COPD exacerbation requiring intubation and ICU stay. 5/21 admitted for sepsis and pneumonia. 6/2 transferred to ICU, intubated underwent bronchoscopy 6/3 self extubated followed by reintubated 6/7 HD started 6/15 extubated 6/19 transferred to [facility]. Neurology, nephrology, urology and infectious disease consulted. 6/24 Recurrent Fevers, treated with short course of antibiotics. 6/26 Right PICC line placed. 6/27 HD stopped.  Goals of care discussed with palliative team 6/29 EGD and G Tube placed for persistent dysphagia.   Currently plan is arrange safe discharge plan to SNF. Pt seen and examined at bedside, wants the tube feeds to be stopped. He wants to eat regular food.   Assessment & Plan:   Principal Problem:   CAP (community acquired pneumonia) Active Problems:   Acute metabolic encephalopathy   Severe sepsis with septic shock (HCC)   Neurogenic bladder   Chronic, continuous use of opioids   Chronic low back pain   Type 2 diabetes mellitus without complication (HCC)   Migraines   Recurrent UTI   History of colon polyps   Urinary retention   Altered mental status   Pressure injury of skin   AKI (acute kidney injury) ([place])   Malnutrition of moderate degree   Acute encephalopathy Secondary to anoxic brain injury Neurology consulted, suggested gradual improvement over time. Metabolic work-up so far negative. EEG is negative. He is alert and oriented and answering all questions appropriately.    Acute kidney injury associated with hypernatremia  hyperkalemia and hyperphosphatemia Probably secondary to ATN with underlying diabetic nephropathy Patient was on dialysis temporarily and nephrology believed that patient is not a good candidate for outpatient HD. Patient and family does not want to pursue further hemodialysis if renal function worsens.   Chronic pain syndrome Resume home medications    Type 2 diabetes mellitus with renal complications Continue with sliding scale insulin A1c 7.4 Insulin-dependent. CBG (last 3)  Recent Labs    07/22/20 0337 07/22/20 0810 07/22/20 1114  GLUCAP 107* 180* 149*   No changes in meds.      Essential hypertension Blood pressure parameters are optimal.    Anemia of chronic disease Transfuse to keep hemoglobin greater than 7.    Septic shock secondary to community-acquired pneumonia present on admission Acute severe hypoxic respiratory failure in the setting of COPD exacerbation Resolved at this time.  Complete the course of IV antibiotics.  He was initially intubated in NICU later on self extubated.     Pressure injury present on admission Pressure Injury 06/23/20 Coccyx Posterior;Mid Stage 2 -  Partial thickness loss of dermis presenting as a shallow open injury with a red, pink wound bed without slough. (Active)  06/23/20 1400  Location: Coccyx  Location Orientation: Posterior;Mid  Staging: Stage 2 -  Partial thickness loss of dermis presenting as a shallow open injury with a red, pink wound bed without slough.  Wound Description (Comments):   Present on Admission: Yes   Foam dressing at present.   Hypercalcemia Will try gentle hydration and one dose of lasix to see if hypercalcemia improves.  PTH levels ordered and pending.  Anemia of chronic disease Hemoglobin around 8 and stable. Transfuse to keep hemoglobin greater than 7.    Pressure injury present on admission.  Pressure Injury 06/23/20 Coccyx Posterior;Mid Stage 2 -  Partial thickness loss of  dermis presenting as a shallow open injury with a red, pink wound bed without slough. (Active)  06/23/20 1400  Location: Coccyx  Location Orientation: Posterior;Mid  Staging: Stage 2 -  Partial thickness loss of dermis presenting as a shallow open injury with a red, pink wound bed without slough.  Wound Description (Comments):   Present on Admission: Yes    Severe Protein Calorie Malnutrition:  Pt is on tube feeds, but he is asking for chunky veges and fruits.  Recommend SLP evaluation to see if he can be on regular diet. Pt requesting to stop the tube feeds.      DVT prophylaxis: Heparin Code Status: Full code  Family Communication: None at bedside  disposition:   Status is: Inpatient  Remains inpatient appropriate because:Unsafe d/c plan  Dispo: The patient is from: Home              Anticipated d/c is to: SNF              Patient currently is medically stable to d/c.   Difficult to place patient Yes       Consultants:  Neurology Nephrology     Subjective: Wants to eat regular food. No chest pain or sob. Some abd pain.   Objective: Vitals:   07/22/20 0026 07/22/20 0434 07/22/20 0739 07/22/20 1113  BP: 126/60 115/66 (!) 125/91 104/73  Pulse: 61 71 84 76  Resp: _0 Temp: 98.9 F (37.2 C) 98.8 F (37.1 C) (!) 97.5 F (36.4 C) 98.4 F (36.9 C)  TempSrc:      SpO2: 100% (!) 89% 100% 100%  Weight:      Height:        Intake/Output Summary (Last 24 hours) at 07/22/2020 1425 Last data filed at 07/22/2020 1352 Gross per 24 hour  Intake 660 ml  Output 5600 ml  Net -4940 ml    Filed Weights   07/18/20 0451 07/19/20 0332 07/21/20 0500  Weight: 82.6 kg 85.1 kg 82.9 kg    Examination:  General exam: alert and comfortable.  Respiratory system: Air entry fair, no wheezing heard.  Cardiovascular system: S1-S2 heard, RRR, no JVD, no pedal edema Gastrointestinal system: Abdomen is soft, non tender non distended bowel sounds wnl.  Central nervous  system: aalert and oriented to place and person.  Extremities: No pedal edema Skin: stage 2 coccyx pressure injury  Psychiatry: Mood is appropriate.     Data Reviewed: I have personally reviewed following labs and imaging studies  CBC: Recent Labs  Lab 07/19/20 1126 07/22/20 0618  WBC 9.9 7.3  HGB 8.0* 8.7*  HCT 23.9* 26.3*  MCV 90.2 89.8  PLT 310 320     Basic Metabolic Panel: Recent Labs  Lab 07/16/20 0440 07/19/20 1126 07/20/20 0632 07/22/20 0618  NA 132* 134*  --  132*  K 3.7 3.5  --  4.2  CL 96* 96*  --  95*  CO2 29 30  --  29  GLUCOSE 158* 136*  --  112*  BUN 122* 94*  --  86*  CREATININE 2.71* 2.60*  --  2.70*  CALCIUM 11.5* 11.2*  --  11.0*  PHOS  --   --  4.6  --      GFR:  Estimated Creatinine Clearance: 29 mL/min (A) (by C-G formula based on SCr of 2.7 mg/dL (H)).  Liver Function Tests: No results for input(s): AST, ALT, ALKPHOS, BILITOT, PROT, ALBUMIN in the last 168 hours.  CBG: Recent Labs  Lab 07/21/20 2002 07/22/20 0028 07/22/20 0337 07/22/20 0810 07/22/20 1114  GLUCAP 172* 174* 107* 180* 149*      No results found for this or any previous visit (from the past 240 hour(s)).       Radiology Studies: No results found.      Scheduled Meds:  amLODipine  10 mg Per Tube Daily   chlorhexidine gluconate (MEDLINE KIT)  15 mL Mouth Rinse BID   Chlorhexidine Gluconate Cloth  6 each Topical Daily   cloNIDine  0.3 mg Transdermal Weekly   feeding supplement (PROSource TF)  45 mL Per Tube Daily   gabapentin  100 mg Oral Q8H   heparin injection (subcutaneous)  5,000 Units Subcutaneous Q8H   insulin aspart  0-20 Units Subcutaneous Q4H   insulin aspart  3 Units Subcutaneous Q4H   insulin glargine  20 Units Subcutaneous Daily   metoprolol tartrate  100 mg Per Tube BID   multivitamin  1 tablet Per Tube QHS   nutrition supplement (JUVEN)  1 packet Per Tube BID   [START ON 07/23/2020] pantoprazole  40 mg Oral Daily   prazosin  4 mg Per  Tube QHS   QUEtiapine  25 mg Per Tube BID   traZODone  150 mg Per Tube QHS   Continuous Infusions:  sodium chloride Stopped (06/22/20 1614)     LOS: 48 days        Hosie Poisson, MD [facility]   To contact the attending provider between 7A-7P or the covering provider during after hours 7P-7A, please log into the web site [email or web address] and access using universal [place] password for that web site. If you do not have the password, please call the hospital operator.  07/22/2020, 2:25 PM

## 2020-07-22 NOTE — Progress Notes (Addendum)
Physical Therapy Treatment Patient Details Name: Wyatt Ball. MRN: [id number] DOB: [birth year] Today's Date: 07/22/2020    History of Present Illness Wyatt Ball. is a 60 y.o. 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  Since hospitalization pt has been intubated, finally extubated 6/15.    PT Comments    Pt is making limited progress towards goals with ability to perform bed mobility, however refusing OOB attempts this date. Reports increased stomach pain with all movement. At end of session, able to reposition to eat breakfast. Good endurance with supine there-ex with most of activity demonstrating active movement. Progress note updated with goals updated. Pt with no change in status at this time. Will continue to progress as able.   Follow Up Recommendations  SNF     Equipment Recommendations   (TBD)    Recommendations for Other Services       Precautions / Restrictions Precautions Precautions: Fall Restrictions Weight Bearing Restrictions: No    Mobility  Bed Mobility Overal bed mobility: Needs Assistance Bed Mobility: Rolling Rolling: Min assist         General bed mobility comments: Pt able to roll to B sides with min assist for trunkal assist. Refuses to perform OOB mobility at this time    Transfers                 General transfer comment: refuses this date due to pain  Ambulation/Gait                 Stairs             Wheelchair Mobility    Modified Rankin (Stroke Patients Only)       Balance Overall balance assessment: Needs assistance                                           Cognition Arousal/Alertness: Awake/alert Behavior During Therapy: WFL for tasks assessed/performed Overall Cognitive Status: History of cognitive impairments - at baseline                                 General Comments: follows commands, however does appear confused and demonstrates labile temperment. Fluctuations in ability to agreement to participate.      Exercises Other Exercises Other Exercises: supine ther-ex performed on B LE including heel slides, AP, quad sets, hip abd/add, SAQ, and bridging. 10 reps performed. Other Exercises: performed rolling to B side with reaching across body x 5 reps each side. Safe technique performed    General Comments        Pertinent Vitals/Pain Pain Assessment: 0-10 Pain Score: 10-Worst pain ever Pain Location: abdominal pain Pain Descriptors / Indicators: Discomfort;Grimacing Pain Intervention(s): Repositioned;Monitored during session    Home Living                      Prior Function            PT Goals (current goals can now be found in the care plan  section) Acute Rehab PT Goals Patient Stated Goal: none stated PT Goal Formulation: With patient Time For Goal Achievement: 08/05/20 Potential to Achieve Goals: Fair Progress towards PT goals: Progressing toward goals    Frequency    Min 2X/week      PT Plan Current plan remains appropriate    Co-evaluation              AM-PAC PT "6 Clicks" Mobility   Outcome Measure  Help needed turning from your back to your side while in a flat bed without using bedrails?: A Little Help needed moving from lying on your back to sitting on the side of a flat bed without using bedrails?: A Lot Help needed moving to and from a bed to a chair (including a wheelchair)?: Total Help needed standing up from a chair using your arms (e.g., wheelchair or bedside chair)?: Total Help needed to walk in hospital room?: Total Help needed climbing 3-5  steps with a railing? : Total 6 Click Score: 9    End of Session   Activity Tolerance: Patient limited by pain Patient left: in bed;with bed alarm set Nurse Communication: Mobility status PT Visit Diagnosis: Muscle weakness (generalized) (M62.81);Adult, failure to thrive (R62.7);Pain Pain - Right/Left:  (neck) Pain - part of body:  (neck)     Time: 3875-6433 PT Time Calculation (min) (ACUTE ONLY): 23 min  Charges:  $Therapeutic Exercise: 8-22 mins $Therapeutic Activity: 8-22 mins                     Elizabeth Palau, PT, DPT [phone]    Britny Riel 07/22/2020, 1:01 PM

## 2020-07-22 NOTE — Progress Notes (Signed)
Occupational Therapy Treatment Patient Details Name: Wyatt Ball. MRN: [id number] DOB: 1960-06-08 Today's Date: 07/22/2020    History of present illness Wyatt Ball. is a 60 y.o. 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  Since hospitalization pt has been intubated, finally extubated 6/15.   OT comments  Pt seen for OT treatment on this date. Upon arrival to room pt awake seated upright in bed. Pt requires SET-UP A for washing face, MIN A to don hospital gown, MAX A to don socks, and MAX A for posterior peri-care at bed-level. Pt initially deferring OOB mobility, however agreeable following encouragement, bed-level ADLs, and bed-level therapy exercises. Pt requires MAX A+2 for supine<>sit transfers and MIN A to maintain static sitting balance at EOB. Pt attempted x1 sit>stand transfer, however unable to clear hips from bed following MAX A+2 and use of RW, and refusing additional attempts. Pt assisted back to bed, in no acute distress, with all needs within reach. Pt continues to benefit from skilled OT services to maximize return to PLOF and minimize risk of future falls, injury, caregiver burden, and readmission. Will continue to follow POC. Discharge recommendation remains appropriate.     Follow Up Recommendations  SNF    Equipment Recommendations  Other (comment) (defer to next venue of care)       Precautions / Restrictions Precautions Precautions: Fall Restrictions Weight Bearing Restrictions: No       Mobility Bed Mobility Overal bed mobility: Needs Assistance Bed Mobility: Rolling;Supine to Sit;Sit to Supine Rolling: Min  assist   Supine to sit: Max assist;+2 for physical assistance Sit to supine: Max assist;+2 for physical assistance      Transfers Overall transfer level: Needs assistance Equipment used: Rolling walker (2 wheeled) Transfers: Sit to/from Stand Sit to Stand: Max assist;+2 physical assistance         General transfer comment: unable to clear hips from bed, and pt refusing additional attempts    Balance Overall balance assessment: Needs assistance Sitting-balance support: Bilateral upper extremity supported;Feet supported Sitting balance-Leahy Scale: Poor   Postural control: Other (comment) (anterior and lateral leans) Standing balance support: Bilateral upper extremity supported;During functional activity Standing balance-Leahy Scale: Zero Standing balance comment: unable to clear hips from bed, and pt refusing additional attempts                           ADL either performed or assessed with clinical judgement   ADL Overall ADL's : Needs assistance/impaired     Grooming: Wash/dry face;Set up;Bed level           Upper Body Dressing : Minimal assistance;Sitting Upper Body Dressing Details (indicate cue type and reason): to don hospital gown Lower Body Dressing: Maximal assistance;Bed level Lower Body Dressing Details (indicate cue type and reason): to don/doff socks     Toileting- Clothing Manipulation and Hygiene: Maximal assistance;Bed level Toileting - Clothing Manipulation Details (indicate cue type and reason): MAX A for posterior peri-care              Cognition Arousal/Alertness: Awake/alert Behavior During Therapy: WFL for tasks assessed/performed Overall Cognitive Status: History of cognitive impairments - at baseline  Area of Impairment: Orientation;Following commands;Safety/judgement;Awareness;Problem solving                 Orientation Level: Disoriented to;Time;Situation     Following Commands: Follows one step commands  inconsistently Safety/Judgement: Decreased awareness of safety;Decreased awareness of deficits Awareness: Intellectual Problem Solving: Slow processing;Decreased initiation;Difficulty sequencing;Requires verbal cues;Requires tactile cues General Comments: Labile temperament, with fluctuations in ability to agreement to participate.                   Pertinent Vitals/ Pain       Pain Assessment: 0-10 Pain Score: 4  Pain Location: back Pain Descriptors / Indicators: Discomfort;Grimacing Pain Intervention(s): Limited activity within patient's tolerance;Monitored during session;Repositioned         Frequency  Min 1X/week        Progress Toward Goals  OT Goals(current goals can now be found in the care plan section)  Progress towards OT goals: Progressing toward goals  Acute Rehab OT Goals Patient Stated Goal: to have less pain OT Goal Formulation: With patient Time For Goal Achievement: 07/31/20 Potential to Achieve Goals: Fair  Plan Frequency remains appropriate;Discharge plan remains appropriate       AM-PAC OT "6 Clicks" Daily Activity     Outcome Measure   Help from another person eating meals?: A Little Help from another person taking care of personal grooming?: A Little Help from another person toileting, which includes using toliet, bedpan, or urinal?: A Lot Help from another person bathing (including washing, rinsing, drying)?: A Lot Help from another person to put on and taking off regular upper body clothing?: A Little Help from another person to put on and taking off regular lower body clothing?: A Lot 6 Click Score: 15    End of Session Equipment Utilized During Treatment: Gait belt;Rolling walker  OT Visit Diagnosis: Other symptoms and signs involving cognitive function   Activity Tolerance Patient tolerated treatment well   Patient Left in bed;with call bell/phone within reach;with bed alarm set   Nurse Communication Mobility status         Time: 4536-4680 OT Time Calculation (min): 34 min  Charges: OT General Charges $OT Visit: 1 Visit OT Treatments $Self Care/Home Management : 8-22 mins $Therapeutic Activity: 8-22 mins  Matthew Folks, OTR/L [facility] [phone]

## 2020-07-23 DIAGNOSIS — J189 Pneumonia, unspecified organism: Secondary | ICD-10-CM | POA: Diagnosis not present

## 2020-07-23 DIAGNOSIS — N179 Acute kidney failure, unspecified: Secondary | ICD-10-CM | POA: Diagnosis not present

## 2020-07-23 LAB — GLUCOSE, CAPILLARY
Glucose-Capillary: 113 mg/dL — ABNORMAL HIGH (ref 70–99)
Glucose-Capillary: 114 mg/dL — ABNORMAL HIGH (ref 70–99)
Glucose-Capillary: 154 mg/dL — ABNORMAL HIGH (ref 70–99)
Glucose-Capillary: 164 mg/dL — ABNORMAL HIGH (ref 70–99)
Glucose-Capillary: 167 mg/dL — ABNORMAL HIGH (ref 70–99)
Glucose-Capillary: 182 mg/dL — ABNORMAL HIGH (ref 70–99)
Glucose-Capillary: 92 mg/dL (ref 70–99)

## 2020-07-23 LAB — BASIC METABOLIC PANEL
Anion gap: 11 (ref 5–15)
BUN: 73 mg/dL — ABNORMAL HIGH (ref 8–23)
CO2: 28 mmol/L (ref 22–32)
Calcium: 10.4 mg/dL — ABNORMAL HIGH (ref 8.9–10.3)
Chloride: 97 mmol/L — ABNORMAL LOW (ref 98–111)
Creatinine, Ser: 2.87 mg/dL — ABNORMAL HIGH (ref 0.61–1.24)
GFR, Estimated: 24 mL/min — ABNORMAL LOW (ref >60)
Glucose, Bld: 110 mg/dL — ABNORMAL HIGH (ref 70–99)
Potassium: 5.4 mmol/L — ABNORMAL HIGH (ref 3.5–5.1)
Sodium: 136 mmol/L (ref 135–145)

## 2020-07-23 MED ORDER — SODIUM ZIRCONIUM CYCLOSILICATE 10 G PO PACK
10.0000 g | PACK | Freq: Two times a day (BID) | ORAL | Status: AC
  Administered 2020-07-23 (×2): 10 g via ORAL
  Filled 2020-07-23 (×2): qty 1

## 2020-07-23 NOTE — Progress Notes (Signed)
PROGRESS NOTE    Wyatt Ball [employer].  GXQ:[id number] DOB: [birth year] DOA: 06/04/2020 PCP: System, Provider Not In     Chief Complaint  Patient presents with   Altered Mental Status    Brief Narrative:   HTN, type II DM, PTSD, migraine, neurogenic bladder, remote history of transverse myelitis, GERD.  Presents with complaints of confusion found to have septic shock, unknown organism with acute kidney injury, COPD exacerbation requiring intubation and ICU stay. 5/21 admitted for sepsis and pneumonia. 6/2 transferred to ICU, intubated underwent bronchoscopy 6/3 self extubated followed by reintubated 6/7 HD started 6/15 extubated 6/19 transferred to [facility]. Neurology, nephrology, urology and infectious disease consulted. 6/24 Recurrent Fevers, treated with short course of antibiotics. 6/26 Right PICC line placed. 6/27 HD stopped.  Goals of care discussed with palliative team 6/29 EGD and G Tube placed for persistent dysphagia.   Currently plan is arrange safe discharge plan to SNF. Pt seen and examined at bedside, no new complaints today.   Assessment & Plan:   Principal Problem:   CAP (community acquired pneumonia) Active Problems:   Acute metabolic encephalopathy   Severe sepsis with septic shock (HCC)   Neurogenic bladder   Chronic, continuous use of opioids   Chronic low back pain   Type 2 diabetes mellitus without complication (HCC)   Migraines   Recurrent UTI   History of colon polyps   Urinary retention   Altered mental status   Pressure injury of skin   AKI (acute kidney injury) ([place])   Malnutrition of moderate degree   Acute encephalopathy Secondary to anoxic brain injury Neurology consulted, suggested gradual improvement over time. Metabolic work-up so far negative. EEG is negative. He is alert and oriented and answering all questions appropriately.    Acute kidney injury associated with hypernatremia hyperkalemia and hyperphosphatemia Probably  secondary to ATN with underlying diabetic nephropathy Patient was on dialysis temporarily and nephrology believed that patient is not a good candidate for outpatient HD. Patient and family does not want to pursue further hemodialysis if renal function worsens.   Chronic pain syndrome Resume home medications    Type 2 diabetes mellitus with renal complications Continue with sliding scale insulin A1c 7.4 Insulin-dependent. CBG (last 3)  Recent Labs    07/23/20 0017 07/23/20 0357 07/23/20 0825  GLUCAP 114* 92 113*   No changes in meds. Pt currently on carb modified diet.      Essential hypertension Blood pressure parameters are optimal.    Anemia of chronic disease Transfuse to keep hemoglobin greater than 7.    Septic shock secondary to community-acquired pneumonia present on admission Acute severe hypoxic respiratory failure in the setting of COPD exacerbation Resolved at this time.  Completed the course of IV antibiotics.   He was initially intubated in ICU later on self extubated.   Hypercalcemia Will try gentle hydration and one dose of lasix to see if hypercalcemia improves.  PTH levels  reviewed , repeat labs are pending.    Anemia of chronic disease Hemoglobin around 8 and stable. Transfuse to keep hemoglobin greater than 7.    Pressure injury present on admission.  Pressure Injury 06/23/20 Coccyx Posterior;Mid Stage 2 -  Partial thickness loss of dermis presenting as a shallow open injury with a red, pink wound bed without slough. (Active)  06/23/20 1400  Location: Coccyx  Location Orientation: Posterior;Mid  Staging: Stage 2 -  Partial thickness loss of dermis presenting as a shallow open injury with a red, pink wound bed  without slough.  Wound Description (Comments):   Present on Admission: Yes  Has foam dressing.    Severe Protein Calorie Malnutrition:  Pt is on tube feeds, but he is asking for chunky veges and fruits.  Recommend SLP  evaluation to see if he can be on regular diet. Pt requesting to stop the tube feeds.      DVT prophylaxis: Heparin Code Status: Full code  Family Communication: None at bedside  disposition:   Status is: Inpatient  Remains inpatient appropriate because:Unsafe d/c plan  Dispo: The patient is from: Home              Anticipated d/c is to: SNF              Patient currently is medically stable to d/c.   Difficult to place patient Yes       Consultants:  Neurology Nephrology     Subjective: No chest pain or sob.   Objective: Vitals:   07/22/20 1952 07/23/20 0026 07/23/20 0536 07/23/20 0803  BP: 118/66 (!) 139/58 139/62 (!) 99/59  Pulse: 66 62 68 70  Resp: 18 16 18 16   Temp: 97.7 F (36.5 C) 98.4 F (36.9 C) 97.9 F (36.6 C) 97.6 F (36.4 C)  TempSrc: Oral  Oral   SpO2: 100% 99% 100% 100%  Weight:      Height:        Intake/Output Summary (Last 24 hours) at 07/23/2020 0830 Last data filed at 07/23/2020 0806 Gross per 24 hour  Intake 1900.32 ml  Output 5575 ml  Net -3674.68 ml    Filed Weights   07/18/20 0451 07/19/20 0332 07/21/20 0500  Weight: 82.6 kg 85.1 kg 82.9 kg    Examination:  General exam: elderly gentleman, not in distress.  Respiratory system: clear to auscultation, no wheezing heard.   Cardiovascular system: S1-S2 heard, RRR, no JVD, no pedal edema.  Gastrointestinal system: Abdomen is soft, NT ND BS+ Central nervous system: Alert and oriented, able to answering questions appropriately.  Extremities: No pedal edema Skin: stage 2 coccyx pressure injury  Psychiatry: Mood is appropriate.     Data Reviewed: I have personally reviewed following labs and imaging studies  CBC: Recent Labs  Lab 07/19/20 1126 07/22/20 0618  WBC 9.9 7.3  HGB 8.0* 8.7*  HCT 23.9* 26.3*  MCV 90.2 89.8  PLT 310 320     Basic Metabolic Panel: Recent Labs  Lab 07/19/20 1126 07/20/20 0632 07/21/20 0542 07/22/20 0618  NA 134*  --   --  132*  K  3.5  --   --  4.2  CL 96*  --   --  95*  CO2 30  --   --  29  GLUCOSE 136*  --   --  112*  BUN 94*  --   --  86*  CREATININE 2.60*  --   --  2.70*  CALCIUM 11.2*  --  10.9* 11.0*  PHOS  --  4.6  --   --      GFR: Estimated Creatinine Clearance: 29 mL/min (A) (by C-G formula based on SCr of 2.7 mg/dL (H)).  Liver Function Tests: No results for input(s): AST, ALT, ALKPHOS, BILITOT, PROT, ALBUMIN in the last 168 hours.  CBG: Recent Labs  Lab 07/22/20 1629 07/22/20 2019 07/23/20 0017 07/23/20 0357 07/23/20 0825  GLUCAP 285* 81 114* 92 113*      No results found for this or any previous visit (from the past 240 hour(s)).  Radiology Studies: No results found.      Scheduled Meds:  amLODipine  10 mg Per Tube Daily   chlorhexidine gluconate (MEDLINE KIT)  15 mL Mouth Rinse BID   Chlorhexidine Gluconate Cloth  6 each Topical Daily   cloNIDine  0.3 mg Transdermal Weekly   feeding supplement (PROSource TF)  45 mL Per Tube Daily   gabapentin  100 mg Oral Q8H   heparin injection (subcutaneous)  5,000 Units Subcutaneous Q8H   insulin aspart  0-20 Units Subcutaneous Q4H   insulin aspart  3 Units Subcutaneous Q4H   insulin glargine  20 Units Subcutaneous Daily   metoprolol tartrate  100 mg Per Tube BID   multivitamin  1 tablet Per Tube QHS   nutrition supplement (JUVEN)  1 packet Per Tube BID   pantoprazole  40 mg Oral Daily   prazosin  4 mg Per Tube QHS   QUEtiapine  25 mg Per Tube BID   traZODone  150 mg Per Tube QHS   Continuous Infusions:  sodium chloride Stopped (06/22/20 1614)   sodium chloride 75 mL/hr at 07/22/20 1539     LOS: 16 days        Hosie Poisson, MD [facility]   To contact the attending provider between 7A-7P or the covering provider during after hours 7P-7A, please log into the web site [email or web address] and access using universal [place] password for that web site. If you do not have the password, please call the hospital  operator.  07/23/2020, 8:30 AM

## 2020-07-24 DIAGNOSIS — J189 Pneumonia, unspecified organism: Secondary | ICD-10-CM | POA: Diagnosis not present

## 2020-07-24 LAB — GLUCOSE, CAPILLARY
Glucose-Capillary: 105 mg/dL — ABNORMAL HIGH (ref 70–99)
Glucose-Capillary: 134 mg/dL — ABNORMAL HIGH (ref 70–99)
Glucose-Capillary: 192 mg/dL — ABNORMAL HIGH (ref 70–99)
Glucose-Capillary: 154 mg/dL — ABNORMAL HIGH (ref 70–99)
Glucose-Capillary: 166 mg/dL — ABNORMAL HIGH (ref 70–99)
Glucose-Capillary: 96 mg/dL (ref 70–99)

## 2020-07-24 MED ORDER — INSULIN ASPART 100 UNIT/ML IJ SOLN: 0.0000 [IU] | Freq: Three times a day (TID) | INTRAMUSCULAR | Status: DC

## 2020-07-24 MED ORDER — INSULIN ASPART 100 UNIT/ML IJ SOLN
0.0000 [IU] | Freq: Three times a day (TID) | INTRAMUSCULAR | Status: DC
  Administered 2020-07-25: 7 [IU] via SUBCUTANEOUS
  Administered 2020-07-25: 4 [IU] via SUBCUTANEOUS
  Administered 2020-07-25: 3 [IU] via SUBCUTANEOUS
  Administered 2020-07-26: 7 [IU] via SUBCUTANEOUS
  Administered 2020-07-26: 17:00:00 3 [IU] via SUBCUTANEOUS
  Administered 2020-07-26: 7 [IU] via SUBCUTANEOUS
  Administered 2020-07-26: 08:00:00 3 [IU] via SUBCUTANEOUS
  Administered 2020-07-27 (×2): 4 [IU] via SUBCUTANEOUS
  Administered 2020-07-28: 18:00:00 3 [IU] via SUBCUTANEOUS
  Administered 2020-07-28: 09:00:00 7 [IU] via SUBCUTANEOUS
  Administered 2020-07-29 (×3): 3 [IU] via SUBCUTANEOUS
  Administered 2020-07-30: 19:00:00 2 [IU] via SUBCUTANEOUS
  Administered 2020-07-30 (×2): 3 [IU] via SUBCUTANEOUS
  Administered 2020-07-31 (×4): 4 [IU] via SUBCUTANEOUS
  Administered 2020-08-01: 10:00:00 3 [IU] via SUBCUTANEOUS
  Administered 2020-08-01 (×2): 11 [IU] via SUBCUTANEOUS
  Administered 2020-08-02: 3 [IU] via SUBCUTANEOUS
  Filled 2020-07-24 (×25): qty 1

## 2020-07-24 MED ORDER — NEPRO/CARBSTEADY PO LIQD
237.0000 mL | Freq: Two times a day (BID) | ORAL | Status: DC
  Administered 2020-07-24 – 2020-07-31 (×9): 237 mL via ORAL

## 2020-07-24 MED ORDER — CHLORHEXIDINE GLUCONATE CLOTH 2 % EX PADS
6.0000 | MEDICATED_PAD | Freq: Every day | CUTANEOUS | Status: DC
  Administered 2020-07-24 – 2020-08-02 (×10): 6 via TOPICAL

## 2020-07-24 NOTE — Progress Notes (Signed)
Physical Therapy Treatment Patient Details Name: Wyatt Ball. MRN: [id number] DOB: 1960-05-05 Today's Date: 07/24/2020    History of Present Illness Gaspar Garbe Berrones Montez Hageman. is a 60 y.o. 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  Since hospitalization pt has been intubated, finally extubated 6/15.    PT Comments    Pt with improved affect this date and agreeable to OOB mobility. PT/OT co-treat this date. Still required +2 assist (communicated with RN for technique). Still demonstrates poor standing balance/tolerance with B LE atrophy noted. Will continue to progress towards goals. Currently recommend SNF.   Follow Up Recommendations  SNF     Equipment Recommendations   (TBD)    Recommendations for Other Services       Precautions / Restrictions Precautions Precautions: Fall Restrictions Weight Bearing Restrictions: No    Mobility  Bed Mobility Overal bed mobility: Needs Assistance Bed Mobility: Supine to Sit     Supine to sit: Min assist     General bed mobility comments: able to initiate mobility efforts, however needs min assist to complete movement including scooting towards EOB. Once seated, progresses to only supervision for static sitting    Transfers Overall transfer level: Needs assistance Equipment used: 2 person hand held assist Transfers: Sit to/from Stand Sit to Stand: Max assist;+2 physical assistance         General transfer comment: able to stand with flexed posture. Good effort with periods of only mod +2 required. Able to take shuffle steps over to recliner.  Ambulation/Gait             General Gait  Details: unable   Stairs             Wheelchair Mobility    Modified Rankin (Stroke Patients Only)       Balance Overall balance assessment: Needs assistance Sitting-balance support: Bilateral upper extremity supported;Feet supported Sitting balance-Leahy Scale: Fair     Standing balance support: Bilateral upper extremity supported;During functional activity Standing balance-Leahy Scale: Zero                              Cognition Arousal/Alertness: Awake/alert Behavior During Therapy: WFL for tasks assessed/performed Overall Cognitive Status: History of cognitive impairments - at baseline                                        Exercises      General Comments        Pertinent Vitals/Pain Pain Assessment: Faces Faces Pain Scale: Hurts even more Pain Location: B LEs when extended in bed Pain Descriptors / Indicators: Discomfort;Grimacing Pain Intervention(s): Limited activity within patient's tolerance;Repositioned    Home Living                      Prior Function            PT Goals (current goals can now be found in the care plan section) Acute Rehab PT Goals Patient Stated Goal: to get stronger PT Goal Formulation: With patient Time For Goal  Achievement: 08/05/20 Potential to Achieve Goals: Fair Progress towards PT goals: Progressing toward goals    Frequency    Min 2X/week      PT Plan Current plan remains appropriate    Co-evaluation PT/OT/SLP Co-Evaluation/Treatment: Yes Reason for Co-Treatment: For patient/therapist safety;To address functional/ADL transfers PT goals addressed during session: Mobility/safety with mobility OT goals addressed during session: ADL's and self-care      AM-PAC PT "6 Clicks" Mobility   Outcome Measure  Help needed turning from your back to your side while in a flat bed without using bedrails?: A Little Help needed moving from lying on your back to sitting on the  side of a flat bed without using bedrails?: A Little Help needed moving to and from a bed to a chair (including a wheelchair)?: Total Help needed standing up from a chair using your arms (e.g., wheelchair or bedside chair)?: A Lot Help needed to walk in hospital room?: Total Help needed climbing 3-5 steps with a railing? : Total 6 Click Score: 11    End of Session Equipment Utilized During Treatment: Gait belt Activity Tolerance: Patient tolerated treatment well Patient left: in chair;with chair alarm set Nurse Communication: Mobility status (discussed with RN on assist level for return transfer to bed) PT Visit Diagnosis: Muscle weakness (generalized) (M62.81);Adult, failure to thrive (R62.7);Pain Pain - Right/Left:  (bilat) Pain - part of body: Leg     Time: 1049-1100 PT Time Calculation (min) (ACUTE ONLY): 11 min  Charges:  $Therapeutic Activity: 8-22 mins                     Elizabeth Palau, PT, DPT [phone]    Seirra Kos 07/24/2020, 12:03 PM

## 2020-07-24 NOTE — TOC Progression Note (Signed)
Transition of Care (TOC) - Progression Note    Patient Details  Name: Wyatt Ball. MRN: [id number] Date of Birth: July 14, 1960  Transition of Care [facility]) CM/SW Contact  Caryn Section, RN Phone Number: 07/24/2020, 4:19 PM  Clinical Narrative: Sherron Monday to [facility] Administrator, Kenney Houseman at 1500 today, who confirmed that facility cannot accept patient due to facility at capacity for [facility] patients.  [place], message left for Laqueta Carina at [facility].  Will await response.    Expected Discharge Plan: Skilled Nursing Facility Barriers to Discharge: Continued Medical Work up  Expected Discharge Plan and Services Expected Discharge Plan: Skilled Nursing Facility In-house Referral: NA   Post Acute Care Choice: Skilled Nursing Facility Living arrangements for the past 2 months: Single Family Home                                       Social Determinants of Health (SDOH) Interventions    Readmission Risk Interventions No flowsheet data found.

## 2020-07-24 NOTE — Progress Notes (Signed)
Nutrition Follow-up  DOCUMENTATION CODES:  Non-severe (moderate) malnutrition in context of chronic illness  INTERVENTION:  Continue current diet as ordered, adjust per SLP recommendations Nepro Shake po BID, each supplement provides 425 kcal and 19 grams protein. If pt doesn't consume, will order to bolus contianers Rena-vit daily via tube  Juven Fruit Punch BID via tube, each serving provides 95kcal and 2.5g of protein (amino acids glutamine and arginine)  NUTRITION DIAGNOSIS:  Moderate Malnutrition (in the context of chronic illness) related to inability to eat as evidenced by mild fat depletion, mild muscle depletion, moderate muscle depletion  - ongoing  GOAL:  Patient will meet greater than or equal to 90% of their needs  - progressing  MONITOR:  TF tolerance, Labs, Weight trends  ASSESSMENT:  60 y.o. male with h/o hypertension, PTSD, diabetes, migraines, GERD, neurogenic bladder secondary to remote history of transverse myelitis, requires self-catheterization and has frequent UTIs and opioids for chronic pain who is admitted on 06/04/2020 for CAP and sepsis   6/6 HD initiation 6/17 NGT replaced 6/24 NGT replaced again 6/27 - Nephrology signed off, no further HD 6/29 - PEG placed by GI 7/6 - SLP advanced to clear liquids 7/7 - SLP advanced to DYS 1 diet 7/18 - TF stopped by MD per pt request, DYS 3 diet initiated  Pt with increasing oral intake since last assessment. Pt requested that MD turn TF off which was ultimately done 7/18 once diet was advanced to DYS 3. Overall intake inadequate to meet estimated needs, but intake is trending up. RN reports that brother brought dinner in for pt last night, intake may be slightly better than documented. Will add Nepro BID. If pt does not drink these orally, will need to be bolused to provide needed nutrition.   Noted that K trending up yesterday, no labs today. Will monitor.  Average Meal Intake: 7/13-7/20: 33% intake x 10 recorded  meals (trending up)  Nutritionally Relevant Medications: Scheduled Meds:  feeding supplement (NEPRO CARB STEADY)  237 mL Oral BID BM   insulin aspart  0-20 Units Subcutaneous Q4H   insulin glargine  20 Units Subcutaneous Daily   multivitamin  1 tablet Per Tube QHS   nutrition supplement (JUVEN)  1 packet Per Tube BID   pantoprazole  40 mg Oral Daily   PRN Meds: alum & mag hydroxide-simeth, ondansetron  Labs Reviewed: K 5.4 (7/19) BUN 73, creatinine 2.87 (7/19) SBG ranges from 105-182 mg/dL over the last 24 hours HgbA1c 7.4% (5/31)  Nutrition Focused Physical Exam Flowsheet Row Most Recent Value  Orbital Region Mild depletion  Upper Arm Region No depletion  Thoracic and Lumbar Region No depletion  Buccal Region Mild depletion  Temple Region Moderate depletion  Clavicle Bone Region Mild depletion  Clavicle and Acromion Bone Region Mild depletion  Scapular Bone Region Mild depletion  Dorsal Hand Mild depletion  Patellar Region Mild depletion  Anterior Thigh Region Mild depletion  Posterior Calf Region Moderate depletion  Edema (RD Assessment) None  Hair Reviewed  Eyes Reviewed  Mouth Reviewed  Skin Reviewed  Nails Reviewed   Diet Order:   Diet Order             DIET DYS 3 Room service appropriate? Yes; Fluid consistency: Thin  Diet effective now                  EDUCATION NEEDS:  No education needs have been identified at this time  Skin:  Skin Assessment: Skin Integrity Issues: (sacral  wound 1.8 cm x 0.8 cm) Skin Integrity Issues:: Stage II Stage II: coccyx  Last BM:  7/20 - type 6  Height:  Ht Readings from Last 1 Encounters:  06/18/20 5' 5.98" (1.676 m)    Weight:  Wt Readings from Last 1 Encounters:  07/21/20 82.9 kg   Ideal Body Weight:  64.5 kg  BMI:  Body mass index is 29.51 kg/m.  Estimated Nutritional Needs:  Kcal:  2200-2400 kcal/d Protein:  110-125g/d Fluid:  2-2.2 L/d  Greig Castilla, RD, LDN Clinical Dietitian Pager on Amion

## 2020-07-24 NOTE — Progress Notes (Signed)
PROGRESS NOTE    Wyatt Ball.  GEX:[id number] DOB: September 16, 1960 DOA: 06/04/2020 PCP: System, Provider Not In     Chief Complaint  Patient presents with   Altered Mental Status    Brief Narrative:   HTN, type II DM, PTSD, migraine, neurogenic bladder, remote history of transverse myelitis, GERD.  Presents with complaints of confusion found to have septic shock, unknown organism with acute kidney injury, COPD exacerbation requiring intubation and ICU stay. 5/21 admitted for sepsis and pneumonia. 6/2 transferred to ICU, intubated underwent bronchoscopy 6/3 self extubated followed by reintubated 6/7 HD started 6/15 extubated 6/19 transferred to [facility]. Neurology, nephrology, urology and infectious disease consulted. 6/24 Recurrent Fevers, treated with short course of antibiotics. 6/26 Right PICC line placed. 6/27 HD stopped.  Goals of care discussed with palliative team 6/29 EGD and G Tube placed for persistent dysphagia.   Currently plan is arrange safe discharge plan to SNF. Pt seen and examined at bedside, no new complaints today.   Assessment & Plan:   Principal Problem:   CAP (community acquired pneumonia) Active Problems:   Acute metabolic encephalopathy   Severe sepsis with septic shock (HCC)   Neurogenic bladder   Chronic, continuous use of opioids   Chronic low back pain   Type 2 diabetes mellitus without complication (HCC)   Migraines   Recurrent UTI   History of colon polyps   Urinary retention   Altered mental status   Pressure injury of skin   AKI (acute kidney injury) ([place])   Malnutrition of moderate degree   Acute encephalopathy Secondary to anoxic brain injury Neurology consulted, suggested gradual improvement over time. Metabolic work-up so far negative. EEG is negative. He is alert and oriented and answering all questions appropriately.    Acute kidney injury associated with hypernatremia hyperkalemia and hyperphosphatemia Probably  secondary to ATN with underlying diabetic nephropathy Patient was on dialysis temporarily and nephrology believed that patient is not a good candidate for outpatient HD. Patient and family does not want to pursue further hemodialysis if renal function worsens.   Chronic pain syndrome Resume home medications    Type 2 diabetes mellitus with renal complications Continue with sliding scale insulin A1c 7.4 Insulin-dependent. CBG (last 3)  Recent Labs    07/24/20 0400 07/24/20 0805 07/24/20 1158  GLUCAP 105* 134* 192*  No changes in meds. Pt currently on carb modified diet.      Essential hypertension Blood pressure parameters are optimal.    Anemia of chronic disease Transfuse to keep hemoglobin greater than 7.    Septic shock secondary to community-acquired pneumonia present on admission Acute severe hypoxic respiratory failure in the setting of COPD exacerbation Resolved at this time.  Completed the course of IV antibiotics.   He was initially intubated in ICU later on self extubated.   Hypercalcemia Will try gentle hydration and one dose of lasix to see if hypercalcemia improves.  PTH levels  reviewed , repeat labs are pending.    Anemia of chronic disease Hemoglobin around 8 and stable. Transfuse to keep hemoglobin greater than 7.    Pressure injury present on admission.  Pressure Injury 06/23/20 Coccyx Posterior;Mid Stage 2 -  Partial thickness loss of dermis presenting as a shallow open injury with a red, pink wound bed without slough. (Active)  06/23/20 1400  Location: Coccyx  Location Orientation: Posterior;Mid  Staging: Stage 2 -  Partial thickness loss of dermis presenting as a shallow open injury with a red, pink wound bed without  slough.  Wound Description (Comments):   Present on Admission: Yes  Has foam dressing.    Severe Protein Calorie Malnutrition:  Pt is on tube feeds, but he is asking for chunky veges and fruits.  Recommend SLP  evaluation to see if he can be on regular diet. Pt requesting to stop the tube feeds.      DVT prophylaxis: Heparin Code Status: Full code  Family Communication: None at bedside  disposition:   Status is: Inpatient  Remains inpatient appropriate because:Unsafe d/c plan  Dispo: The patient is from: Home              Anticipated d/c is to: SNF              Patient currently is medically stable to d/c.   Difficult to place patient Yes       Consultants:         [facility] s/p Intubation Neurology Nephrology, s/p HD     Subjective: No chest pain or sob.  Lying comfortably, no active issues.  Objective: Vitals:   07/23/20 2351 07/24/20 0400 07/24/20 0808 07/24/20 1158  BP: 131/67 113/64 (!) 119/57 121/73  Pulse: 87 72 78 81  Resp: 15 18 18 18   Temp: 98.2 F (36.8 C) 98.4 F (36.9 C) (!) 97.5 F (36.4 C) 98.8 F (37.1 C)  TempSrc:  Oral  Oral  SpO2: 100% 100% 100% 100%  Weight:      Height:        Intake/Output Summary (Last 24 hours) at 07/24/2020 1234 Last data filed at 07/24/2020 1106 Gross per 24 hour  Intake 0 ml  Output 5175 ml  Net -5175 ml   Filed Weights   07/18/20 0451 07/19/20 0332 07/21/20 0500  Weight: 82.6 kg 85.1 kg 82.9 kg    Examination:  General exam: elderly gentleman, not in distress.  Respiratory system: clear to auscultation, no wheezing heard.   Cardiovascular system: S1-S2 heard, RRR, no JVD, no pedal edema.  Gastrointestinal system: Abdomen is soft, NT ND BS+ Central nervous system: Alert and oriented, able to answering questions appropriately.  Extremities: No pedal edema Skin: stage 2 coccyx pressure injury  Psychiatry: Mood is appropriate.     Data Reviewed: I have personally reviewed following labs and imaging studies  CBC: Recent Labs  Lab 07/19/20 1126 07/22/20 0618  WBC 9.9 7.3  HGB 8.0* 8.7*  HCT 23.9* 26.3*  MCV 90.2 89.8  PLT 310 747    Basic Metabolic Panel: Recent Labs  Lab 07/19/20 1126 07/20/20 0632  07/21/20 0542 07/22/20 0618 07/23/20 0844  NA 134*  --   --  132* 136  K 3.5  --   --  4.2 5.4*  CL 96*  --   --  95* 97*  CO2 30  --   --  29 28  GLUCOSE 136*  --   --  112* 110*  BUN 94*  --   --  86* 73*  CREATININE 2.60*  --   --  2.70* 2.87*  CALCIUM 11.2*  --  10.9* 11.0* 10.4*  PHOS  --  4.6  --   --   --     GFR: Estimated Creatinine Clearance: 27.3 mL/min (A) (by C-G formula based on SCr of 2.87 mg/dL (H)).  Liver Function Tests: No results for input(s): AST, ALT, ALKPHOS, BILITOT, PROT, ALBUMIN in the last 168 hours.  CBG: Recent Labs  Lab 07/23/20 2036 07/23/20 2352 07/24/20 0400 07/24/20 0805 07/24/20 1158  GLUCAP 182* 164* 105* 134* 192*     No results found for this or any previous visit (from the past 240 hour(s)).       Radiology Studies: No results found.      Scheduled Meds:  amLODipine  10 mg Per Tube Daily   chlorhexidine gluconate (MEDLINE KIT)  15 mL Mouth Rinse BID   Chlorhexidine Gluconate Cloth  6 each Topical Daily   cloNIDine  0.3 mg Transdermal Weekly   feeding supplement (NEPRO CARB STEADY)  237 mL Oral BID BM   gabapentin  100 mg Oral Q8H   heparin injection (subcutaneous)  5,000 Units Subcutaneous Q8H   insulin aspart  0-20 Units Subcutaneous Q4H   insulin glargine  20 Units Subcutaneous Daily   metoprolol tartrate  100 mg Per Tube BID   multivitamin  1 tablet Per Tube QHS   nutrition supplement (JUVEN)  1 packet Per Tube BID   pantoprazole  40 mg Oral Daily   prazosin  4 mg Per Tube QHS   QUEtiapine  25 mg Per Tube BID   traZODone  150 mg Per Tube QHS   Continuous Infusions:  sodium chloride Stopped (06/22/20 1614)     LOS: 50 days        Val Riles, MD [facility]   To contact the attending provider between 7A-7P or the covering provider during after hours 7P-7A, please log into the web site [email or web address] and access using universal [place] password for that web site. If you do not have the  password, please call the hospital operator.  07/24/2020, 12:34 PM

## 2020-07-24 NOTE — Progress Notes (Signed)
Occupational Therapy Treatment Patient Details Name: Wyatt Ball. MRN: [id number] DOB: Oct 14, 1960 Today's Date: 07/24/2020    History of present illness Wyatt Garbe [place] Hageman. is a 59 y.o. 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  Since hospitalization pt has been intubated, finally extubated 6/15.   OT comments  Pt seen for OT tx this date to f/u re: safety with ADLs. Pt requires MOD A for LB dressing in long sitting in bed. Pt requires MIN A to SETUP for opening containers for self-feeding with sherbet. Pt requires MAX A to come to sitting at EOB as well as cues to sequence. Pt agreeable to attempting to come to stand and pivot. He requires MAX A +2 to come to stand and makes good effort to shuffle his feet to get to chair adjacent to his L side. Pt left in chair with pillows placed under BLE for comfort. Pt left with call button in reach and chair alarm set. RN notified that pt requires 2p assist for back to bed but has progressed beyond needing hoyer lift. Will continue to follow. Pt tolerated session well.    Follow Up Recommendations  SNF    Equipment Recommendations  Other (comment)    Recommendations for Other Services      Precautions / Restrictions Precautions Precautions: Fall Restrictions Weight Bearing Restrictions: No       Mobility Bed Mobility Overal bed mobility: Needs Assistance Bed Mobility: Sit to Supine     Supine to sit: Min assist Sit to supine: Mod assist;+2 for physical assistance   General bed mobility comments: needs assist for B LE and repositioning in sidelying    Transfers Overall transfer level:  Needs assistance Equipment used: 2 person hand held assist Transfers: Sit to/from Stand Sit to Stand: Max assist;+2 physical assistance         General transfer comment: stand pivot from EOB to chair with pt making effort to shuffle/step feet towards chair. Pt with difficulty coming to fully extended stnading position, noted to still have hips moderately in flexion with cues to squeeze glutes to improve extension.    Balance Overall balance assessment: Needs assistance Sitting-balance support: Bilateral upper extremity supported;Feet supported Sitting balance-Leahy Scale: Fair Sitting balance - Comments: anterior and lateral leans   Standing balance support: Bilateral upper extremity supported;During functional activity Standing balance-Leahy Scale: Zero Standing balance comment: requiring MAX A +2 to clear hips and requires 2p assist to sustain, noted to have hips in flexion despite efforts to block knees to assist him in extending.                           ADL either performed or assessed with clinical judgement   ADL Overall ADL's : Needs assistance/impaired Eating/Feeding: Set up;Minimal assistance;Sitting Eating/Feeding Details (indicate cue type and reason): to open condiments/containers.                 Lower Body Dressing: Moderate assistance Lower Body Dressing Details (indicate cue type and reason): long sitting in bed to don socks, pt demos good hip and knee flexibility to complete task, bust just weakness in grasp as well as general  leg weakness.                     Vision Patient Visual Report: No change from baseline     Perception     Praxis      Cognition Arousal/Alertness: Awake/alert Behavior During Therapy: WFL for tasks assessed/performed Overall Cognitive Status: History of cognitive impairments - at baseline Area of Impairment: Orientation;Following commands;Safety/judgement;Awareness;Problem solving                  Orientation Level: Disoriented to;Situation     Following Commands: Follows one step commands with increased time Safety/Judgement: Decreased awareness of deficits Awareness: Intellectual Problem Solving: Slow processing;Requires verbal cues;Requires tactile cues General Comments: pt with overall improved mentation today, improved ability to follow commands. Overall appropraite with ~90% of command following, occasionally requring increased time to process.        Exercises Other Exercises Other Exercises: seated participation in LB ADLs and opening containers to increase grasp strength.   Shoulder Instructions       General Comments      Pertinent Vitals/ Pain       Pain Assessment: Faces Faces Pain Scale: Hurts little more Pain Location: B LEs when extended in bed Pain Descriptors / Indicators: Discomfort;Grimacing Pain Intervention(s): Limited activity within patient's tolerance;Repositioned;Other (comment) (pillows under LEs)  Home Living                                          Prior Functioning/Environment              Frequency  Min 1X/week        Progress Toward Goals  OT Goals(current goals can now be found in the care plan section)  Progress towards OT goals: Progressing toward goals  Acute Rehab OT Goals Patient Stated Goal: to get stronger OT Goal Formulation: With patient Time For Goal Achievement: 07/31/20 Potential to Achieve Goals: Fair  Plan Frequency remains appropriate;Discharge plan remains appropriate    Co-evaluation    PT/OT/SLP Co-Evaluation/Treatment: Yes Reason for Co-Treatment: For patient/therapist safety;To address functional/ADL transfers PT goals addressed during session: Mobility/safety with mobility OT goals addressed during session: ADL's and self-care      AM-PAC OT "6 Clicks" Daily Activity     Outcome Measure   Help from another person eating meals?: A Little Help from another person taking  care of personal grooming?: A Little Help from another person toileting, which includes using toliet, bedpan, or urinal?: A Lot Help from another person bathing (including washing, rinsing, drying)?: A Lot Help from another person to put on and taking off regular upper body clothing?: A Little Help from another person to put on and taking off regular lower body clothing?: A Lot 6 Click Score: 15    End of Session Equipment Utilized During Treatment: Gait belt;Rolling walker  OT Visit Diagnosis: Other symptoms and signs involving cognitive function   Activity Tolerance Patient tolerated treatment well   Patient Left with call bell/phone within reach;in chair;with chair alarm set   Nurse Communication Mobility status        Time: 1937-9024 OT Time Calculation (min): 29 min  Charges: OT General Charges $OT Visit: 1 Visit OT Treatments $Self Care/Home Management : 8-22 mins  Rejeana Brock, MS, OTR/L ascom [phone] 07/24/20, 3:07 PM

## 2020-07-24 NOTE — Progress Notes (Signed)
Physical Therapy Treatment Patient Details Name: Wyatt Ball. MRN: [id number] DOB: 01-04-1961 Today's Date: 07/24/2020    History of Present Illness Wyatt Garbe [place] Monte[place] Hageman. is a 60 y.o. 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  Since hospitalization pt has been intubated, finally extubated 6/15.    PT Comments    Pt is making gradual progress towards goals. 2nd session due to assist RN for transfer from chair->bed. Assisted in repositioning. Still remains Max A +2 for all mobility. Again, discussed with RN on safe mobility including use of appropriate staff and use of gait belt. Not safe to attempt RW at this time. Will continue to progress.   Follow Up Recommendations  SNF     Equipment Recommendations   (TBD)    Recommendations for Other Services       Precautions / Restrictions Precautions Precautions: Fall Restrictions Weight Bearing Restrictions: No    Mobility  Bed Mobility Overal bed mobility: Needs Assistance Bed Mobility: Sit to Supine     Supine to sit: Min assist Sit to supine: Mod assist;+2 for physical assistance   General bed mobility comments: needs assist for B LE and repositioning in sidelying    Transfers Overall transfer level: Needs assistance Equipment used: 2 person hand held assist Transfers: Sit to/from Stand Sit to Stand: Max assist;+2 physical assistance         General transfer comment: SPT from chair->bed. Able to take several steps over to bed with mod cues for sequencing.  Ambulation/Gait             General Gait Details: unable   Stairs             Wheelchair Mobility     Modified Rankin (Stroke Patients Only)       Balance Overall balance assessment: Needs assistance Sitting-balance support: Bilateral upper extremity supported;Feet supported Sitting balance-Leahy Scale: Fair     Standing balance support: Bilateral upper extremity supported;During functional activity Standing balance-Leahy Scale: Zero                              Cognition Arousal/Alertness: Awake/alert Behavior During Therapy: WFL for tasks assessed/performed Overall Cognitive Status: History of cognitive impairments - at baseline                                        Exercises      General Comments        Pertinent Vitals/Pain Pain Assessment: Faces Faces Pain Scale: Hurts little more Pain Location: B LEs when extended in bed Pain Descriptors / Indicators: Discomfort;Grimacing Pain Intervention(s): Limited activity within patient's tolerance;Repositioned (in sidelying)    Home Living                      Prior Function            PT Goals (current goals can now be found in the care plan section) Acute Rehab PT Goals Patient Stated Goal: to get stronger PT Goal Formulation: With patient Time For Goal Achievement:  08/05/20 Potential to Achieve Goals: Fair Progress towards PT goals: Progressing toward goals    Frequency    Min 2X/week      PT Plan Current plan remains appropriate    Co-evaluation PT/OT/SLP Co-Evaluation/Treatment: Yes Reason for Co-Treatment: For patient/therapist safety;To address functional/ADL transfers PT goals addressed during session: Mobility/safety with mobility OT goals addressed during session: ADL's and self-care      AM-PAC PT "6 Clicks" Mobility   Outcome Measure  Help needed turning from your back to your side while in a flat bed without using bedrails?: A Little Help needed moving from lying on your back to sitting on the side of a flat bed without using bedrails?: A  Little Help needed moving to and from a bed to a chair (including a wheelchair)?: Total Help needed standing up from a chair using your arms (e.g., wheelchair or bedside chair)?: A Lot Help needed to walk in hospital room?: Total Help needed climbing 3-5 steps with a railing? : Total 6 Click Score: 11    End of Session Equipment Utilized During Treatment: Gait belt Activity Tolerance: Patient tolerated treatment well Patient left: in bed;with bed alarm set Nurse Communication: Mobility status PT Visit Diagnosis: Muscle weakness (generalized) (M62.81);Adult, failure to thrive (R62.7);Pain Pain - Right/Left:  (bilat) Pain - part of body: Leg     Time: 1255-1305 PT Time Calculation (min) (ACUTE ONLY): 10 min  Charges:  $Therapeutic Activity: 8-22 mins                     Elizabeth Palau, PT, DPT [phone]    Brandn Mcgath 07/24/2020, 1:34 PM

## 2020-07-25 DIAGNOSIS — J189 Pneumonia, unspecified organism: Secondary | ICD-10-CM | POA: Diagnosis not present

## 2020-07-25 LAB — CBC
HCT: 25.3 % — ABNORMAL LOW (ref 39.0–52.0)
Hemoglobin: 8.1 g/dL — ABNORMAL LOW (ref 13.0–17.0)
MCH: 30.2 pg (ref 26.0–34.0)
MCHC: 32 g/dL (ref 30.0–36.0)
MCV: 94.4 fL (ref 80.0–100.0)
Platelets: 300 10*3/uL (ref 150–400)
RBC: 2.68 MIL/uL — ABNORMAL LOW (ref 4.22–5.81)
RDW: 14.6 % (ref 11.5–15.5)
WBC: 6.2 10*3/uL (ref 4.0–10.5)
nRBC: 0 % (ref 0.0–0.2)

## 2020-07-25 LAB — BASIC METABOLIC PANEL
Anion gap: 10 (ref 5–15)
BUN: 53 mg/dL — ABNORMAL HIGH (ref 8–23)
CO2: 26 mmol/L (ref 22–32)
Calcium: 9.4 mg/dL (ref 8.9–10.3)
Chloride: 97 mmol/L — ABNORMAL LOW (ref 98–111)
Creatinine, Ser: 2.89 mg/dL — ABNORMAL HIGH (ref 0.61–1.24)
GFR, Estimated: 24 mL/min — ABNORMAL LOW (ref >60)
Glucose, Bld: 111 mg/dL — ABNORMAL HIGH (ref 70–99)
Potassium: 5.4 mmol/L — ABNORMAL HIGH (ref 3.5–5.1)
Sodium: 133 mmol/L — ABNORMAL LOW (ref 135–145)

## 2020-07-25 LAB — GLUCOSE, CAPILLARY
Glucose-Capillary: 105 mg/dL — ABNORMAL HIGH (ref 70–99)
Glucose-Capillary: 204 mg/dL — ABNORMAL HIGH (ref 70–99)
Glucose-Capillary: 134 mg/dL — ABNORMAL HIGH (ref 70–99)
Glucose-Capillary: 155 mg/dL — ABNORMAL HIGH (ref 70–99)

## 2020-07-25 LAB — MAGNESIUM: Magnesium: 2.6 mg/dL — ABNORMAL HIGH (ref 1.7–2.4)

## 2020-07-25 LAB — PHOSPHORUS: Phosphorus: 3.1 mg/dL (ref 2.5–4.6)

## 2020-07-25 MED ORDER — SODIUM ZIRCONIUM CYCLOSILICATE 10 G PO PACK
10.0000 g | PACK | Freq: Three times a day (TID) | ORAL | Status: AC
  Administered 2020-07-25 (×2): 10 g via ORAL
  Filled 2020-07-25 (×3): qty 1

## 2020-07-25 NOTE — Progress Notes (Signed)
Physical Therapy Treatment Patient Details Name: Wyatt Ball. MRN: [id number] DOB: Feb 11, 1960 Today's Date: 07/25/2020    History of Present Illness Wyatt Ball Hageman. is a 60 y.o. 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  Since hospitalization pt has been intubated, finally extubated 6/15.    PT Comments    Pt was long sitting in bed, awake, and oriented x 3. He was agreeable to session and cooperative throughout. Therapist does question pt's effort throughout session. Pt is however improving from a PT standpoint. +1 assistance throughout session today. He stood 3 x EOB to RW prior to standing and taking ~ 5 steps to recliner. Heavy reliance on RW for support. Recommend +2 assistance for RN staff during any/all transfers. Recommend stand pivot only due top pt's high risk of falls. He was sitting in recliner with chair alarm in place and call bell in reach at conclusion of session. Acute PT will continue efforts to progress pt to PLOF however highly recommend DC to rehab to maximize independence with ADLs.    Follow Up Recommendations  SNF     Equipment Recommendations  Other (comment) (defer to next level of care)       Precautions / Restrictions Precautions Precautions: Fall Restrictions Weight Bearing Restrictions: No    Mobility  Bed Mobility Overal bed mobility: Needs Assistance Bed Mobility: Supine to Sit     Supine to sit: Min assist     General bed mobility comments: min assist to progress from supine (HOB elevated ~ 20 degrees) to short sit EOB    Transfers Overall transfer level: Needs assistance Equipment used:  Rolling walker (2 wheeled) Transfers: Sit to/from Stand Sit to Stand: Max assist         General transfer comment: Pt performed STS 3 x EOB to RW with progressively lower surface height. Progressed to stand- step with RW to recliner that was placed ~ 5 ft away from EOB.  Ambulation/Gait Ambulation/Gait assistance: Max assist Gait Distance (Feet): 5 Feet Assistive device: Rolling walker (2 wheeled) Gait Pattern/deviations: Step-to pattern;Staggering right;Staggering left;Trunk flexed;Narrow base of support Gait velocity: decreased   General Gait Details: Pt is extremely high fall risk 2/2 to impulsivity+ weakness. Therapist does questions pt's effort throughout session.      Balance Overall balance assessment: Needs assistance Sitting-balance support: Bilateral upper extremity supported;Feet supported Sitting balance-Leahy Scale: Good Sitting balance - Comments: no LOB sitting EOB x > 8 minutes during session. close supervision due to pt's impulsivity/cognition deficits   Standing balance support: Bilateral upper extremity supported;During functional activity Standing balance-Leahy Scale: Poor Standing balance comment: Requires max assist to maintain balance at EOB with BUE support on RW.      Cognition Arousal/Alertness: Awake/alert Behavior During Therapy: WFL for tasks assessed/performed Overall Cognitive Status: History of cognitive impairments - at baseline Area of Impairment: Orientation;Following commands;Safety/judgement;Awareness;Problem solving      Orientation Level: Disoriented to;Situation     Following Commands: Follows one step commands consistently;Follows one step commands with increased time;Follows multi-step commands inconsistently Safety/Judgement: Decreased awareness of safety;Decreased awareness of deficits Awareness: Intellectual Problem Solving: Slow processing;Requires verbal cues;Requires tactile cues General Comments: Pt is alert and oriented x  3.  Therapist questions if pt is manipulative due to inconsistent cognitive responses to questions.      Exercises General Exercises - Lower Extremity Long Arc Quad: AROM;10 reps;Seated (3 x 10 BLES/ marching also.)        Pertinent Vitals/Pain Pain Assessment: No/denies pain Pain Score: 0-No pain Pain Intervention(s): Limited activity within patient's tolerance;Monitored during session     PT Goals (current goals can now be found in the care plan section) Acute Rehab PT Goals Patient Stated Goal: "do what I use to do" Progress towards PT goals: Progressing toward goals    Frequency    Min 2X/week      PT Plan Current plan remains appropriate    Co-evaluation     PT goals addressed during session: Mobility/safety with mobility;Balance;Proper use of DME;Strengthening/ROM        AM-PAC PT "6 Clicks" Mobility   Outcome Measure  Help needed turning from your back to your side while in a flat bed without using bedrails?: A Little Help needed moving from lying on your back to sitting on the side of a flat bed without using bedrails?: A Little Help needed moving to and from a bed to a chair (including a wheelchair)?: A Lot Help needed standing up from a chair using your arms (e.g., wheelchair or bedside chair)?: A Lot Help needed to walk in hospital room?: A Lot Help needed climbing 3-5 steps with a railing? : Total 6 Click Score: 13    End of Session Equipment Utilized During Treatment: Gait belt Activity Tolerance: Patient tolerated treatment well Patient left: in chair;with call bell/phone within reach;with chair alarm set Nurse Communication: Mobility status PT Visit Diagnosis: Muscle weakness (generalized) (M62.81);Adult, failure to thrive (R62.7);Pain     Time: 1350-1415 PT Time Calculation (min) (ACUTE ONLY): 25 min  Charges:  $Therapeutic Activity: 23-37 mins                     Jetta Lout PTA 07/25/20, 2:28 PM

## 2020-07-25 NOTE — Progress Notes (Signed)
PROGRESS NOTE    Wyatt Ball.  NIO:[id number] DOB: [birth year] DOA: 06/04/2020 PCP: System, Provider Not In     Chief Complaint  Patient presents with   Altered Mental Status    Brief Narrative:   HTN, type II DM, PTSD, migraine, neurogenic bladder, remote history of transverse myelitis, GERD.  Presents with complaints of confusion found to have septic shock, unknown organism with acute kidney injury, COPD exacerbation requiring intubation and ICU stay. 5/21 admitted for sepsis and pneumonia. 6/2 transferred to ICU, intubated underwent bronchoscopy 6/3 self extubated followed by reintubated 6/7 HD started 6/15 extubated 6/19 transferred to [facility]. Neurology, nephrology, urology and infectious disease consulted. 6/24 Recurrent Fevers, treated with short course of antibiotics. 6/26 Right PICC line placed. 6/27 HD stopped.  Goals of care discussed with palliative team 6/29 EGD and G Tube placed for persistent dysphagia.   Currently plan is arrange safe discharge plan to SNF. Pt seen and examined at bedside, no new complaints today.   Assessment & Plan:   Principal Problem:   CAP (community acquired pneumonia) Active Problems:   Acute metabolic encephalopathy   Severe sepsis with septic shock (HCC)   Neurogenic bladder   Chronic, continuous use of opioids   Chronic low back pain   Type 2 diabetes mellitus without complication (HCC)   Migraines   Recurrent UTI   History of colon polyps   Urinary retention   Altered mental status   Pressure injury of skin   AKI (acute kidney injury) ([place])   Malnutrition of moderate degree   Acute encephalopathy Secondary to anoxic brain injury Neurology consulted, suggested gradual improvement over time. Metabolic work-up so far negative. EEG is negative. He is alert and oriented and answering all questions appropriately.    Acute kidney injury associated with hypernatremia hyperkalemia and hyperphosphatemia Probably  secondary to ATN with underlying diabetic nephropathy Patient was on dialysis temporarily and nephrology believed that patient is not a good candidate for outpatient HD. Patient and family does not want to pursue further hemodialysis if renal function worsens.   Hyperkalemia secondary to renal failure 7/21 K5.4, Lokelma 10 g p.o. x2 doses given Monitor and treat accordingly.  Chronic pain syndrome Resume home medications    Type 2 diabetes mellitus with renal complications Continue with sliding scale insulin A1c 7.4 Insulin-dependent. CBG (last 3)  Recent Labs    07/24/20 2108 07/25/20 0803 07/25/20 1202  GLUCAP 96 105* 204*  No changes in meds. Pt currently on carb modified diet.      Essential hypertension Blood pressure parameters are optimal.    Anemia of chronic disease Transfuse to keep hemoglobin greater than 7.    Septic shock secondary to community-acquired pneumonia present on admission Acute severe hypoxic respiratory failure in the setting of COPD exacerbation Resolved at this time.  Completed the course of IV antibiotics.   He was initially intubated in ICU later on self extubated.   Hypercalcemia Will try gentle hydration and one dose of lasix to see if hypercalcemia improves.  PTH levels  reviewed , repeat labs are pending.    Anemia of chronic disease Hemoglobin around 8 and stable. Transfuse to keep hemoglobin greater than 7.    Pressure injury present on admission.  Pressure Injury 06/23/20 Coccyx Posterior;Mid Stage 2 -  Partial thickness loss of dermis presenting as a shallow open injury with a red, pink wound bed without slough. (Active)  06/23/20 1400  Location: Coccyx  Location Orientation: Posterior;Mid  Staging: Stage 2 -  Partial thickness loss of dermis presenting as a shallow open injury with a red, pink wound bed without slough.  Wound Description (Comments):   Present on Admission: Yes  Has foam dressing.    Severe  Protein Calorie Malnutrition:  Pt is on tube feeds, but he is asking for chunky veges and fruits.  Recommend SLP evaluation to see if he can be on regular diet. Pt requesting to stop the tube feeds.      DVT prophylaxis: Heparin Code Status: Full code  Family Communication: None at bedside  disposition:   Status is: Inpatient  Remains inpatient appropriate because:Unsafe d/c plan  Dispo: The patient is from: Home              Anticipated d/c is to: SNF              Patient currently is medically stable to d/c.   Difficult to place patient Yes       Consultants:         [facility] s/p Intubation Neurology Nephrology, s/p HD     Subjective: No chest pain or sob.  Lying comfortably, no active issues.  Objective: Vitals:   07/25/20 0354 07/25/20 0500 07/25/20 0805 07/25/20 1201  BP: 101/63  (!) 107/57 112/66  Pulse: 66  71 70  Resp: _0 Temp: 97.8 F (36.6 C)  99.2 F (37.3 C) 98.6 F (37 C)  TempSrc:      SpO2: 99%  96% 100%  Weight:  83.3 kg    Height:        Intake/Output Summary (Last 24 hours) at 07/25/2020 1226 Last data filed at 07/25/2020 1105 Gross per 24 hour  Intake 240 ml  Output 2175 ml  Net -1935 ml   Filed Weights   07/19/20 0332 07/21/20 0500 07/25/20 0500  Weight: 85.1 kg 82.9 kg 83.3 kg    Examination:  General exam: elderly gentleman, not in distress.  Respiratory system: clear to auscultation, no wheezing heard.   Cardiovascular system: S1-S2 heard, RRR, no JVD, no pedal edema.  Gastrointestinal system: Abdomen is soft, NT ND BS+ Central nervous system: Alert and oriented, able to answering questions appropriately.  Extremities: No pedal edema Skin: stage 2 coccyx pressure injury  Psychiatry: Mood is appropriate.     Data Reviewed: I have personally reviewed following labs and imaging studies  CBC: Recent Labs  Lab 07/19/20 1126 07/22/20 0618 07/25/20 0554  WBC 9.9 7.3 6.2  HGB 8.0* 8.7* 8.1*  HCT 23.9* 26.3* 25.3*   MCV 90.2 89.8 94.4  PLT 310 320 956    Basic Metabolic Panel: Recent Labs  Lab 07/19/20 1126 07/20/20 0632 07/21/20 0542 07/22/20 0618 07/23/20 0844 07/25/20 0554  NA 134*  --   --  132* 136 133*  K 3.5  --   --  4.2 5.4* 5.4*  CL 96*  --   --  95* 97* 97*  CO2 30  --   --  _1 GLUCOSE 136*  --   --  112* 110* 111*  BUN 94*  --   --  86* 73* 53*  CREATININE 2.60*  --   --  2.70* 2.87* 2.89*  CALCIUM 11.2*  --  10.9* 11.0* 10.4* 9.4  MG  --   --   --   --   --  2.6*  PHOS  --  4.6  --   --   --  3.1    GFR: Estimated  Creatinine Clearance: 27.2 mL/min (A) (by C-G formula based on SCr of 2.89 mg/dL (H)).  Liver Function Tests: No results for input(s): AST, ALT, ALKPHOS, BILITOT, PROT, ALBUMIN in the last 168 hours.  CBG: Recent Labs  Lab 07/24/20 1533 07/24/20 1606 07/24/20 2108 07/25/20 0803 07/25/20 1202  GLUCAP 166* 154* 96 105* 204*     No results found for this or any previous visit (from the past 240 hour(s)).       Radiology Studies: No results found.      Scheduled Meds:  amLODipine  10 mg Per Tube Daily   chlorhexidine gluconate (MEDLINE KIT)  15 mL Mouth Rinse BID   Chlorhexidine Gluconate Cloth  6 each Topical Daily   cloNIDine  0.3 mg Transdermal Weekly   feeding supplement (NEPRO CARB STEADY)  237 mL Oral BID BM   gabapentin  100 mg Oral Q8H   heparin injection (subcutaneous)  5,000 Units Subcutaneous Q8H   insulin aspart  0-20 Units Subcutaneous TID AC & HS   insulin glargine  20 Units Subcutaneous Daily   metoprolol tartrate  100 mg Per Tube BID   multivitamin  1 tablet Per Tube QHS   nutrition supplement (JUVEN)  1 packet Per Tube BID   pantoprazole  40 mg Oral Daily   prazosin  4 mg Per Tube QHS   QUEtiapine  25 mg Per Tube BID   sodium zirconium cyclosilicate  10 g Oral TID   traZODone  150 mg Per Tube QHS   Continuous Infusions:  sodium chloride Stopped (06/22/20 1614)     LOS: 38 days        Val Riles,  MD [facility]   To contact the attending provider between 7A-7P or the covering provider during after hours 7P-7A, please log into the web site [email or web address] and access using universal [place] password for that web site. If you do not have the password, please call the hospital operator.  07/25/2020, 12:26 PM

## 2020-07-26 DIAGNOSIS — J189 Pneumonia, unspecified organism: Secondary | ICD-10-CM | POA: Diagnosis not present

## 2020-07-26 LAB — CBC
HCT: 26.1 % — ABNORMAL LOW (ref 39.0–52.0)
Hemoglobin: 8.7 g/dL — ABNORMAL LOW (ref 13.0–17.0)
MCH: 30.7 pg (ref 26.0–34.0)
MCHC: 33.3 g/dL (ref 30.0–36.0)
MCV: 92.2 fL (ref 80.0–100.0)
Platelets: 324 10*3/uL (ref 150–400)
RBC: 2.83 MIL/uL — ABNORMAL LOW (ref 4.22–5.81)
RDW: 14.1 % (ref 11.5–15.5)
WBC: 7 10*3/uL (ref 4.0–10.5)
nRBC: 0 % (ref 0.0–0.2)

## 2020-07-26 LAB — BASIC METABOLIC PANEL
Anion gap: 8 (ref 5–15)
BUN: 52 mg/dL — ABNORMAL HIGH (ref 8–23)
CO2: 26 mmol/L (ref 22–32)
Calcium: 9.2 mg/dL (ref 8.9–10.3)
Chloride: 99 mmol/L (ref 98–111)
Creatinine, Ser: 3.08 mg/dL — ABNORMAL HIGH (ref 0.61–1.24)
GFR, Estimated: 22 mL/min — ABNORMAL LOW (ref >60)
Glucose, Bld: 115 mg/dL — ABNORMAL HIGH (ref 70–99)
Potassium: 4.3 mmol/L (ref 3.5–5.1)
Sodium: 133 mmol/L — ABNORMAL LOW (ref 135–145)

## 2020-07-26 LAB — GLUCOSE, CAPILLARY
Glucose-Capillary: 143 mg/dL — ABNORMAL HIGH (ref 70–99)
Glucose-Capillary: 236 mg/dL — ABNORMAL HIGH (ref 70–99)
Glucose-Capillary: 122 mg/dL — ABNORMAL HIGH (ref 70–99)
Glucose-Capillary: 234 mg/dL — ABNORMAL HIGH (ref 70–99)

## 2020-07-26 LAB — MAGNESIUM: Magnesium: 2.3 mg/dL (ref 1.7–2.4)

## 2020-07-26 LAB — PHOSPHORUS: Phosphorus: 4.1 mg/dL (ref 2.5–4.6)

## 2020-07-26 MED ORDER — GABAPENTIN 300 MG PO CAPS
300.0000 mg | ORAL_CAPSULE | Freq: Every day | ORAL | Status: DC
  Administered 2020-07-26 – 2020-08-01 (×7): 300 mg via ORAL
  Filled 2020-07-26 (×7): qty 1

## 2020-07-26 MED ORDER — GABAPENTIN 300 MG PO CAPS: 400.0000 mg | ORAL_CAPSULE | Freq: Three times a day (TID) | ORAL | Status: DC

## 2020-07-26 MED ORDER — GABAPENTIN 100 MG PO CAPS
100.0000 mg | ORAL_CAPSULE | Freq: Two times a day (BID) | ORAL | Status: DC
  Administered 2020-07-27 – 2020-08-02 (×13): 100 mg via ORAL
  Filled 2020-07-26 (×13): qty 1

## 2020-07-26 NOTE — TOC Progression Note (Signed)
Transition of Care (TOC) - Progression Note    Patient Details  Name: Wyatt Ball. MRN: [id number] Date of Birth: 02/19/1960  Transition of Care [facility]) CM/SW Contact  Caryn Section, RN Phone Number: 07/26/2020, 2:43 PM  Clinical Narrative:   TOC spoke with brother [name].  [name] would like patient to be close to [place] if possible, but will agree to [place], [place].  Spoke with Laqueta Carina at [facility], [facility] [place], [facility], [facility] all in network with [place].  Bed search started, however these facilities can only accommodate patient short term.  Patient's brother states that he would only like short term rehab for patient at this time.  Awaiting responses from facilities.    Expected Discharge Plan: Skilled Nursing Facility Barriers to Discharge: Continued Medical Work up  Expected Discharge Plan and Services Expected Discharge Plan: Skilled Nursing Facility In-house Referral: NA   Post Acute Care Choice: Skilled Nursing Facility Living arrangements for the past 2 months: Single Family Home                                       Social Determinants of Health (SDOH) Interventions    Readmission Risk Interventions No flowsheet data found.

## 2020-07-26 NOTE — Plan of Care (Signed)
End of shift summary:  VSS on room air. Pain managed with prn medications. Pt intermittently emotional this shift, sometimes tearful and irritable. Denies n/v. Foley catheter patent, urine output adequate. Blood glucose monitored. Pt refused wound dressing change. Turning himself in bed intermittently. Remained free from falls or injury. Call bell within reach and able to use.  Problem: Health Behavior/Discharge Planning: Goal: Ability to manage health-related needs will improve Outcome: Progressing   Problem: Clinical Measurements: Goal: Will remain free from infection Outcome: Progressing   Problem: Activity: Goal: Risk for activity intolerance will decrease Outcome: Progressing   Problem: Nutrition: Goal: Adequate nutrition will be maintained Outcome: Progressing   Problem: Coping: Goal: Level of anxiety will decrease Outcome: Progressing   Problem: Elimination: Goal: Will not experience complications related to urinary retention Outcome: Progressing   Problem: Pain Managment: Goal: General experience of comfort will improve Outcome: Progressing   Problem: Skin Integrity: Goal: Risk for impaired skin integrity will decrease Outcome: Progressing   Problem: Safety: Goal: Ability to remain free from injury will improve Outcome: Progressing

## 2020-07-26 NOTE — Progress Notes (Signed)
Physical Therapy Treatment Patient Details Name: Wyatt Ball. MRN: [id number] DOB: 02-22-1960 Today's Date: 07/26/2020    History of Present Illness Wyatt [place] Hageman. is a 60 y.o. 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  Since hospitalization pt has been intubated, finally extubated 6/15.    PT Comments    Pt was long sitting in bed upon arriving. He is alert and agreeable to session. Required assistance to exit bed, stand, and ambulate ~ 5 ft to recliner. Pt is slightly impulsive and does have baseline cognition deficits per friend who visited last week. Overall pt tolerated session well. Encouraged increased activity throughout the day to continue to improve strength. Will greatly benefit from SNF at DC to address deficits while maximizing independence with ADLs.    Follow Up Recommendations  SNF     Equipment Recommendations  Other (comment) (defer to next level of care.)       Precautions / Restrictions Precautions Precautions: Fall Precaution Comments: PEG Restrictions Weight Bearing Restrictions: No    Mobility  Bed Mobility Overal bed mobility: Needs Assistance Bed Mobility: Supine to Sit Rolling: Min assist   Supine to sit: Min assist     General bed mobility comments: Pt was able to progress form long sit to short sit EOB with min assist + increased time. HOB elevated and use of bed rails    Transfers Overall transfer level: Needs assistance Equipment used: Rolling walker (2 wheeled) Transfers: Sit to/from Stand Sit to Stand: Mod assist         General transfer comment: Mod assist to stand from EOB to RW.  bed height slightly elevated + vcs for technique, sequencing, and improved safety  Ambulation/Gait Ambulation/Gait assistance: Mod assist;Max assist Gait Distance (Feet): 5 Feet Assistive device: Rolling walker (2 wheeled) Gait Pattern/deviations: Step-to pattern;Staggering right;Staggering left;Trunk flexed;Narrow base of support Gait velocity: decreased   General Gait Details: Pt was able to demonstrate improved ambulation with gait from EOB to recliner. Vcs for posture and encouragement.    Balance Overall balance assessment: Needs assistance Sitting-balance support: Bilateral upper extremity supported;Feet supported Sitting balance-Leahy Scale: Good Sitting balance - Comments: no LOB sitting EOB   Standing balance support: Bilateral upper extremity supported;During functional activity Standing balance-Leahy Scale: Poor Standing balance comment: requires BUE support + constant assistance for safety      Cognition Arousal/Alertness: Awake/alert Behavior During Therapy: WFL for tasks assessed/performed Overall Cognitive Status: History of cognitive impairments - at baseline Area of Impairment: Orientation;Following commands;Safety/judgement;Awareness;Problem solving      General Comments: Pt was alert and cooperative however uses humor/ distraction to high baseline cognition deficits. poor safety awareness and poor insight of deficits             Pertinent Vitals/Pain Pain Assessment: No/denies pain Pain Score: 0-No pain Faces Pain Scale: No hurt Pain Intervention(s): Monitored during session;Repositioned;Limited activity within patient's tolerance     PT Goals (current goals can now be found in the care plan section) Acute Rehab PT Goals Patient Stated Goal: none stated Progress towards PT goals: Progressing toward goals    Frequency    Min 2X/week      PT Plan Current plan remains appropriate  Co-evaluation     PT goals addressed during session:  Mobility/safety with mobility;Balance;Proper use of DME;Strengthening/ROM        AM-PAC PT "6 Clicks" Mobility   Outcome Measure  Help needed turning from your back to your side while in a flat bed without using bedrails?: A Little Help needed moving from lying on your back to sitting on the side of a flat bed without using bedrails?: A Little Help needed moving to and from a bed to a chair (including a wheelchair)?: A Lot Help needed standing up from a chair using your arms (e.g., wheelchair or bedside chair)?: A Lot Help needed to walk in hospital room?: A Lot Help needed climbing 3-5 steps with a railing? : Total 6 Click Score: 13    End of Session Equipment Utilized During Treatment: Gait belt Activity Tolerance: Patient tolerated treatment well Patient left: in chair;with call bell/phone within reach;with chair alarm set Nurse Communication: Mobility status PT Visit Diagnosis: Muscle weakness (generalized) (M62.81);Adult, failure to thrive (R62.7);Pain     Time: 0630-1601 PT Time Calculation (min) (ACUTE ONLY): 28 min  Charges:  $Therapeutic Activity: 23-37 mins                    Jetta Lout PTA 07/26/20, 12:30 PM

## 2020-07-26 NOTE — Progress Notes (Signed)
OT Cancellation Note  Patient Details Name: Wyatt Ball. MRN: [id number] DOB: 06-May-1960   Cancelled Treatment:    Reason Eval/Treat Not Completed: Pain limiting ability to participate. Upon arrival pt in bed, reporting 6/10 pain. Defers therapy session, will follow up at later date.time as able.   Kathie Dike, M.S. OTR/L  07/26/20, 12:33 PM  ascom [phone]

## 2020-07-26 NOTE — Progress Notes (Signed)
PROGRESS NOTE    Wyatt Ball.  NTZ:[id number] DOB: 07-21-1960 DOA: 06/04/2020 PCP: System, Provider Not In     Chief Complaint  Patient presents with   Altered Mental Status    Brief Narrative:   HTN, type II DM, PTSD, migraine, neurogenic bladder, remote history of transverse myelitis, GERD.  Presents with complaints of confusion found to have septic shock, unknown organism with acute kidney injury, COPD exacerbation requiring intubation and ICU stay. 5/21 admitted for sepsis and pneumonia. 6/2 transferred to ICU, intubated underwent bronchoscopy 6/3 self extubated followed by reintubated 6/7 HD started 6/15 extubated 6/19 transferred to [facility]. Neurology, nephrology, urology and infectious disease consulted. 6/24 Recurrent Fevers, treated with short course of antibiotics. 6/26 Right PICC line placed. 6/27 HD stopped.  Goals of care discussed with palliative team 6/29 EGD and G Tube placed for persistent dysphagia.   Currently plan is arrange safe discharge plan to SNF. Pt seen and examined at bedside, no new complaints today.   Assessment & Plan:   Principal Problem:   CAP (community acquired pneumonia) Active Problems:   Acute metabolic encephalopathy   Severe sepsis with septic shock (HCC)   Neurogenic bladder   Chronic, continuous use of opioids   Chronic low back pain   Type 2 diabetes mellitus without complication (HCC)   Migraines   Recurrent UTI   History of colon polyps   Urinary retention   Altered mental status   Pressure injury of skin   AKI (acute kidney injury) ([place])   Malnutrition of moderate degree   Acute encephalopathy Secondary to anoxic brain injury Neurology consulted, suggested gradual improvement over time. Metabolic work-up so far negative. EEG is negative. He is alert and oriented and answering all questions appropriately.    Acute kidney injury associated with hypernatremia hyperkalemia and hyperphosphatemia Probably  secondary to ATN with underlying diabetic nephropathy Patient was on dialysis temporarily and nephrology believed that patient is not a good candidate for outpatient HD. Patient and family does not want to pursue further hemodialysis if renal function worsens.   Hyperkalemia secondary to renal failure 7/21 K5.4, Lokelma 10 g p.o. x2 doses given Monitor and treat accordingly.  Chronic pain syndrome Resume home medications    Type 2 diabetes mellitus with renal complications Continue with sliding scale insulin A1c 7.4 Insulin-dependent. CBG (last 3)  Recent Labs    07/25/20 2118 07/26/20 0727 07/26/20 1152  GLUCAP 155* 143* 236*  No changes in meds. Pt currently on carb modified diet.      Essential hypertension Blood pressure parameters are optimal.    Anemia of chronic disease Transfuse to keep hemoglobin greater than 7.    Septic shock secondary to community-acquired pneumonia present on admission Acute severe hypoxic respiratory failure in the setting of COPD exacerbation Resolved at this time.  Completed the course of IV antibiotics.   He was initially intubated in ICU later on self extubated.   Hypercalcemia Will try gentle hydration and one dose of lasix to see if hypercalcemia improves.  PTH levels  reviewed , repeat labs are pending.    Anemia of chronic disease Hemoglobin around 8 and stable. Transfuse to keep hemoglobin greater than 7.    Pressure injury present on admission.  Pressure Injury 06/23/20 Coccyx Posterior;Mid Stage 2 -  Partial thickness loss of dermis presenting as a shallow open injury with a red, pink wound bed without slough. (Active)  06/23/20 1400  Location: Coccyx  Location Orientation: Posterior;Mid  Staging: Stage 2 -  Partial thickness loss of dermis presenting as a shallow open injury with a red, pink wound bed without slough.  Wound Description (Comments):   Present on Admission: Yes  Has foam dressing.    Severe  Protein Calorie Malnutrition:  Pt is on tube feeds, but he is asking for chunky veges and fruits.  Recommend SLP evaluation to see if he can be on regular diet. Pt requesting to stop the tube feeds.      DVT prophylaxis: Heparin Code Status: Full code  Family Communication: None at bedside  disposition:   Status is: Inpatient  Remains inpatient appropriate because:Unsafe d/c plan  Dispo: The patient is from: Home              Anticipated d/c is to: SNF              Patient currently is medically stable to d/c.   Difficult to place patient Yes       Consultants:         [facility] s/p Intubation Neurology Nephrology, s/p HD     Subjective: No overnight issues, patient was complaining of pain in the right hand 3 fingers, started yesterday off and on and throbbing pain.  Patient was advised to ask RN for as needed pain medication.  Patient denied any chest pain or shortness of breath, no any other active issues. Patient was lying comfortably.   Objective: Vitals:   07/26/20 0500 07/26/20 0619 07/26/20 0726 07/26/20 1102  BP:  119/66 131/67 120/67  Pulse:  76 75 77  Resp:  16 16 16   Temp:  97.8 F (36.6 C) 98.7 F (37.1 C) 98.4 F (36.9 C)  TempSrc:  Oral    SpO2:  100% 100% 100%  Weight: 84.1 kg     Height:        Intake/Output Summary (Last 24 hours) at 07/26/2020 1413 Last data filed at 07/26/2020 1130 Gross per 24 hour  Intake 720 ml  Output 5375 ml  Net -4655 ml   Filed Weights   07/21/20 0500 07/25/20 0500 07/26/20 0500  Weight: 82.9 kg 83.3 kg 84.1 kg    Examination:  General exam: elderly gentleman, not in distress.  Respiratory system: clear to auscultation, no wheezing heard.   Cardiovascular system: S1-S2 heard, RRR, no JVD, no pedal edema.  Gastrointestinal system: Abdomen is soft, NT ND BS+ Central nervous system: Alert and oriented, able to answering questions appropriately.  Extremities: No pedal edema Skin: stage 2 coccyx pressure injury   Psychiatry: Mood is appropriate.     Data Reviewed: I have personally reviewed following labs and imaging studies  CBC: Recent Labs  Lab 07/22/20 0618 07/25/20 0554 07/26/20 0411  WBC 7.3 6.2 7.0  HGB 8.7* 8.1* 8.7*  HCT 26.3* 25.3* 26.1*  MCV 89.8 94.4 92.2  PLT 320 300 703    Basic Metabolic Panel: Recent Labs  Lab 07/20/20 0632 07/21/20 0542 07/22/20 0618 07/23/20 0844 07/25/20 0554 07/26/20 0411  NA  --   --  132* 136 133* 133*  K  --   --  4.2 5.4* 5.4* 4.3  CL  --   --  95* 97* 97* 99  CO2  --   --  29 28 26 26   GLUCOSE  --   --  112* 110* 111* 115*  BUN  --   --  86* 73* 53* 52*  CREATININE  --   --  2.70* 2.87* 2.89* 3.08*  CALCIUM  --  10.9* 11.0*  10.4* 9.4 9.2  MG  --   --   --   --  2.6* 2.3  PHOS 4.6  --   --   --  3.1 4.1    GFR: Estimated Creatinine Clearance: 25.6 mL/min (A) (by C-G formula based on SCr of 3.08 mg/dL (H)).  Liver Function Tests: No results for input(s): AST, ALT, ALKPHOS, BILITOT, PROT, ALBUMIN in the last 168 hours.  CBG: Recent Labs  Lab 07/25/20 1202 07/25/20 1617 07/25/20 2118 07/26/20 0727 07/26/20 1152  GLUCAP 204* 134* 155* 143* 236*     No results found for this or any previous visit (from the past 240 hour(s)).       Radiology Studies: No results found.      Scheduled Meds:  amLODipine  10 mg Per Tube Daily   chlorhexidine gluconate (MEDLINE KIT)  15 mL Mouth Rinse BID   Chlorhexidine Gluconate Cloth  6 each Topical Daily   cloNIDine  0.3 mg Transdermal Weekly   feeding supplement (NEPRO CARB STEADY)  237 mL Oral BID BM   gabapentin  100 mg Oral Q8H   heparin injection (subcutaneous)  5,000 Units Subcutaneous Q8H   insulin aspart  0-20 Units Subcutaneous TID AC & HS   insulin glargine  20 Units Subcutaneous Daily   metoprolol tartrate  100 mg Per Tube BID   multivitamin  1 tablet Per Tube QHS   nutrition supplement (JUVEN)  1 packet Per Tube BID   pantoprazole  40 mg Oral Daily   prazosin   4 mg Per Tube QHS   QUEtiapine  25 mg Per Tube BID   traZODone  150 mg Per Tube QHS   Continuous Infusions:  sodium chloride Stopped (06/22/20 1614)     LOS: 52 days        Val Riles, MD [facility]   To contact the attending provider between 7A-7P or the covering provider during after hours 7P-7A, please log into the web site [email or web address] and access using universal [place] password for that web site. If you do not have the password, please call the hospital operator.  07/26/2020, 2:13 PM

## 2020-07-27 DIAGNOSIS — J189 Pneumonia, unspecified organism: Secondary | ICD-10-CM | POA: Diagnosis not present

## 2020-07-27 LAB — GLUCOSE, CAPILLARY
Glucose-Capillary: 95 mg/dL (ref 70–99)
Glucose-Capillary: 103 mg/dL — ABNORMAL HIGH (ref 70–99)
Glucose-Capillary: 158 mg/dL — ABNORMAL HIGH (ref 70–99)
Glucose-Capillary: 168 mg/dL — ABNORMAL HIGH (ref 70–99)

## 2020-07-27 LAB — CBC
HCT: 24.6 % — ABNORMAL LOW (ref 39.0–52.0)
Hemoglobin: 8 g/dL — ABNORMAL LOW (ref 13.0–17.0)
MCH: 30.4 pg (ref 26.0–34.0)
MCHC: 32.5 g/dL (ref 30.0–36.0)
MCV: 93.5 fL (ref 80.0–100.0)
Platelets: 305 10*3/uL (ref 150–400)
RBC: 2.63 MIL/uL — ABNORMAL LOW (ref 4.22–5.81)
RDW: 14.2 % (ref 11.5–15.5)
WBC: 7.9 10*3/uL (ref 4.0–10.5)
nRBC: 0 % (ref 0.0–0.2)

## 2020-07-27 LAB — BASIC METABOLIC PANEL
Anion gap: 9 (ref 5–15)
BUN: 49 mg/dL — ABNORMAL HIGH (ref 8–23)
CO2: 26 mmol/L (ref 22–32)
Calcium: 8.9 mg/dL (ref 8.9–10.3)
Chloride: 100 mmol/L (ref 98–111)
Creatinine, Ser: 3.02 mg/dL — ABNORMAL HIGH (ref 0.61–1.24)
GFR, Estimated: 23 mL/min — ABNORMAL LOW (ref >60)
Glucose, Bld: 107 mg/dL — ABNORMAL HIGH (ref 70–99)
Potassium: 4.1 mmol/L (ref 3.5–5.1)
Sodium: 135 mmol/L (ref 135–145)

## 2020-07-27 LAB — MAGNESIUM: Magnesium: 2.3 mg/dL (ref 1.7–2.4)

## 2020-07-27 LAB — PHOSPHORUS: Phosphorus: 4.1 mg/dL (ref 2.5–4.6)

## 2020-07-27 MED ORDER — MORPHINE SULFATE ER 15 MG PO TBCR
15.0000 mg | EXTENDED_RELEASE_TABLET | Freq: Two times a day (BID) | ORAL | Status: DC
  Administered 2020-07-27 – 2020-08-02 (×13): 15 mg via ORAL
  Filled 2020-07-27 (×13): qty 1

## 2020-07-27 MED ORDER — OXYCODONE-ACETAMINOPHEN 5-325 MG PO TABS
1.0000 | ORAL_TABLET | Freq: Four times a day (QID) | ORAL | Status: DC | PRN
  Administered 2020-07-27 – 2020-08-01 (×6): 1 via NASOGASTRIC
  Filled 2020-07-27 (×6): qty 1

## 2020-07-27 NOTE — Progress Notes (Signed)
PROGRESS NOTE    Wyatt Ball.  ZOX:[id number] DOB: 11-06-1960 DOA: 06/04/2020 PCP: System, Provider Not In     Chief Complaint  Patient presents with   Altered Mental Status    Brief Narrative:   HTN, type II DM, PTSD, migraine, neurogenic bladder, remote history of transverse myelitis, GERD.  Presents with complaints of confusion found to have septic shock, unknown organism with acute kidney injury, COPD exacerbation requiring intubation and ICU stay. 5/21 admitted for sepsis and pneumonia. 6/2 transferred to ICU, intubated underwent bronchoscopy 6/3 self extubated followed by reintubated 6/7 HD started 6/15 extubated 6/19 transferred to [facility]. Neurology, nephrology, urology and infectious disease consulted. 6/24 Recurrent Fevers, treated with short course of antibiotics. 6/26 Right PICC line placed. 6/27 HD stopped.  Goals of care discussed with palliative team 6/29 EGD and G Tube placed for persistent dysphagia.   Currently plan is arrange safe discharge plan to SNF. Pt seen and examined at bedside, no new complaints today.   Assessment & Plan:   Principal Problem:   CAP (community acquired pneumonia) Active Problems:   Acute metabolic encephalopathy   Severe sepsis with septic shock (HCC)   Neurogenic bladder   Chronic, continuous use of opioids   Chronic low back pain   Type 2 diabetes mellitus without complication (HCC)   Migraines   Recurrent UTI   History of colon polyps   Urinary retention   Altered mental status   Pressure injury of skin   AKI (acute kidney injury) ([place])   Malnutrition of moderate degree   Acute encephalopathy Secondary to anoxic brain injury Neurology consulted, suggested gradual improvement over time. Metabolic work-up so far negative. EEG is negative. He is alert and oriented and answering all questions appropriately.    Acute kidney injury associated with hypernatremia hyperkalemia and hyperphosphatemia Probably  secondary to ATN with underlying diabetic nephropathy Patient was on dialysis temporarily and nephrology believed that patient is not a good candidate for outpatient HD. Patient and family does not want to pursue further hemodialysis if renal function worsens.   Hyperkalemia secondary to renal failure. resolved 7/21 K5.4, Lokelma 10 g p.o. x2 doses given 7/23 K wnl now  Monitor and treat accordingly.  Chronic pain syndrome Resume home medications    Type 2 diabetes mellitus with renal complications Continue with sliding scale insulin A1c 7.4 Insulin-dependent. CBG (last 3)  Recent Labs    07/26/20 1616 07/26/20 2016 07/27/20 0814  GLUCAP 122* 234* 95  No changes in meds. Pt currently on carb modified diet.      Essential hypertension Blood pressure parameters are optimal.    Anemia of chronic disease Transfuse to keep hemoglobin greater than 7.    Septic shock secondary to community-acquired pneumonia present on admission Acute severe hypoxic respiratory failure in the setting of COPD exacerbation Resolved at this time.  Completed the course of IV antibiotics.   He was initially intubated in ICU later on self extubated.   Hypercalcemia Will try gentle hydration and one dose of lasix to see if hypercalcemia improves.  PTH levels  reviewed , repeat labs are pending.    Anemia of chronic disease Hemoglobin around 8 and stable. Transfuse to keep hemoglobin greater than 7.    Pressure injury present on admission.  Pressure Injury 06/23/20 Coccyx Posterior;Mid Stage 2 -  Partial thickness loss of dermis presenting as a shallow open injury with a red, pink wound bed without slough. (Active)  06/23/20 1400  Location: Coccyx  Location Orientation:  Posterior;Mid  Staging: Stage 2 -  Partial thickness loss of dermis presenting as a shallow open injury with a red, pink wound bed without slough.  Wound Description (Comments):   Present on Admission: Yes  Has foam  dressing.    Severe Protein Calorie Malnutrition:  Pt is on tube feeds, but he is asking for chunky veges and fruits.  Recommend SLP evaluation to see if he can be on regular diet. Pt requesting to stop the tube feeds.      DVT prophylaxis: Heparin Code Status: Full code  Family Communication: None at bedside  disposition:   Status is: Inpatient  Remains inpatient appropriate because:Unsafe d/c plan  Dispo: The patient is from: Home              Anticipated d/c is to: SNF              Patient currently is medically stable to d/c.   Difficult to place patient Yes       Consultants:         [facility] s/p Intubation Neurology Nephrology, s/p HD     Subjective: No overnight issues, patient has generalized body ache, resumed home medications morphine MS Contin 15 twice daily and patient was concerned about his diet so started him on regular diet.  Denied any other active issues.    Objective: Vitals:   07/26/20 2029 07/27/20 0025 07/27/20 0442 07/27/20 0814  BP: 126/71 137/64 107/62 119/81  Pulse: 86 71 67 80  Resp: _0 Temp: 98.9 F (37.2 C) 98.3 F (36.8 C) 98.1 F (36.7 C) 98.6 F (37 C)  TempSrc:   Oral Oral  SpO2: 99% 97% 100% 100%  Weight:   82.7 kg   Height:   _1  (1.651 m)     Intake/Output Summary (Last 24 hours) at 07/27/2020 1212 Last data filed at 07/27/2020 0900 Gross per 24 hour  Intake 720 ml  Output 4525 ml  Net -3805 ml   Filed Weights   07/25/20 0500 07/26/20 0500 07/27/20 0442  Weight: 83.3 kg 84.1 kg 82.7 kg    Examination:  General exam: elderly gentleman, not in distress.  Respiratory system: clear to auscultation, no wheezing heard.   Cardiovascular system: S1-S2 heard, RRR, no JVD, no pedal edema.  Gastrointestinal system: Abdomen is soft, NT ND BS+ Central nervous system: Alert and oriented, able to answering questions appropriately.  Extremities: No pedal edema Skin: stage 2 coccyx pressure injury  Psychiatry: Mood  is appropriate.     Data Reviewed: I have personally reviewed following labs and imaging studies  CBC: Recent Labs  Lab 07/22/20 0618 07/25/20 0554 07/26/20 0411 07/27/20 0529  WBC 7.3 6.2 7.0 7.9  HGB 8.7* 8.1* 8.7* 8.0*  HCT 26.3* 25.3* 26.1* 24.6*  MCV 89.8 94.4 92.2 93.5  PLT 320 300 324 353    Basic Metabolic Panel: Recent Labs  Lab 07/22/20 0618 07/23/20 0844 07/25/20 0554 07/26/20 0411 07/27/20 0529  NA 132* 136 133* 133* 135  K 4.2 5.4* 5.4* 4.3 4.1  CL 95* 97* 97* 99 100  CO2 _2 GLUCOSE 112* 110* 111* 115* 107*  BUN 86* 73* 53* 52* 49*  CREATININE 2.70* 2.87* 2.89* 3.08* 3.02*  CALCIUM 11.0* 10.4* 9.4 9.2 8.9  MG  --   --  2.6* 2.3 2.3  PHOS  --   --  3.1 4.1 4.1    GFR: Estimated Creatinine Clearance: 25.4 mL/min (A) (by  C-G formula based on SCr of 3.02 mg/dL (H)).  Liver Function Tests: No results for input(s): AST, ALT, ALKPHOS, BILITOT, PROT, ALBUMIN in the last 168 hours.  CBG: Recent Labs  Lab 07/26/20 0727 07/26/20 1152 07/26/20 1616 07/26/20 2016 07/27/20 0814  GLUCAP 143* 236* 122* 234* 95     No results found for this or any previous visit (from the past 240 hour(s)).       Radiology Studies: No results found.      Scheduled Meds:  amLODipine  10 mg Per Tube Daily   chlorhexidine gluconate (MEDLINE KIT)  15 mL Mouth Rinse BID   Chlorhexidine Gluconate Cloth  6 each Topical Daily   cloNIDine  0.3 mg Transdermal Weekly   feeding supplement (NEPRO CARB STEADY)  237 mL Oral BID BM   gabapentin  300 mg Oral QHS   And   gabapentin  100 mg Oral BID WC   heparin injection (subcutaneous)  5,000 Units Subcutaneous Q8H   insulin aspart  0-20 Units Subcutaneous TID AC & HS   insulin glargine  20 Units Subcutaneous Daily   metoprolol tartrate  100 mg Per Tube BID   morphine  15 mg Oral Q12H   multivitamin  1 tablet Per Tube QHS   nutrition supplement (JUVEN)  1 packet Per Tube BID   pantoprazole  40 mg Oral  Daily   prazosin  4 mg Per Tube QHS   QUEtiapine  25 mg Per Tube BID   traZODone  150 mg Per Tube QHS   Continuous Infusions:  sodium chloride Stopped (06/22/20 1614)     LOS: 64 days        Val Riles, MD [facility]   To contact the attending provider between 7A-7P or the covering provider during after hours 7P-7A, please log into the web site [email or web address] and access using universal [place] password for that web site. If you do not have the password, please call the hospital operator.  07/27/2020, 12:12 PM

## 2020-07-28 DIAGNOSIS — J189 Pneumonia, unspecified organism: Secondary | ICD-10-CM | POA: Diagnosis not present

## 2020-07-28 LAB — BASIC METABOLIC PANEL
Anion gap: 8 (ref 5–15)
BUN: 39 mg/dL — ABNORMAL HIGH (ref 8–23)
CO2: 27 mmol/L (ref 22–32)
Calcium: 8.9 mg/dL (ref 8.9–10.3)
Chloride: 101 mmol/L (ref 98–111)
Creatinine, Ser: 3.59 mg/dL — ABNORMAL HIGH (ref 0.61–1.24)
GFR, Estimated: 18 mL/min — ABNORMAL LOW (ref >60)
Glucose, Bld: 104 mg/dL — ABNORMAL HIGH (ref 70–99)
Potassium: 4.1 mmol/L (ref 3.5–5.1)
Sodium: 136 mmol/L (ref 135–145)

## 2020-07-28 LAB — CBC
HCT: 25.8 % — ABNORMAL LOW (ref 39.0–52.0)
Hemoglobin: 8.3 g/dL — ABNORMAL LOW (ref 13.0–17.0)
MCH: 29.9 pg (ref 26.0–34.0)
MCHC: 32.2 g/dL (ref 30.0–36.0)
MCV: 92.8 fL (ref 80.0–100.0)
Platelets: 311 10*3/uL (ref 150–400)
RBC: 2.78 MIL/uL — ABNORMAL LOW (ref 4.22–5.81)
RDW: 14.4 % (ref 11.5–15.5)
WBC: 6.9 10*3/uL (ref 4.0–10.5)
nRBC: 0 % (ref 0.0–0.2)

## 2020-07-28 LAB — GLUCOSE, CAPILLARY
Glucose-Capillary: 106 mg/dL — ABNORMAL HIGH (ref 70–99)
Glucose-Capillary: 203 mg/dL — ABNORMAL HIGH (ref 70–99)
Glucose-Capillary: 87 mg/dL (ref 70–99)
Glucose-Capillary: 137 mg/dL — ABNORMAL HIGH (ref 70–99)
Glucose-Capillary: 135 mg/dL — ABNORMAL HIGH (ref 70–99)

## 2020-07-28 NOTE — Progress Notes (Signed)
PROGRESS NOTE    Wyatt Ball.  QQP:[id number] DOB: Jul 28, 1960 DOA: 06/04/2020 PCP: System, Provider Not In     Chief Complaint  Patient presents with   Altered Mental Status    Brief Narrative:   HTN, type II DM, PTSD, migraine, neurogenic bladder, remote history of transverse myelitis, GERD.  Presents with complaints of confusion found to have septic shock, unknown organism with acute kidney injury, COPD exacerbation requiring intubation and ICU stay. 5/21 admitted for sepsis and pneumonia. 6/2 transferred to ICU, intubated underwent bronchoscopy 6/3 self extubated followed by reintubated 6/7 HD started 6/15 extubated 6/19 transferred to [facility]. Neurology, nephrology, urology and infectious disease consulted. 6/24 Recurrent Fevers, treated with short course of antibiotics. 6/26 Right PICC line placed. 6/27 HD stopped.  Goals of care discussed with palliative team 6/29 EGD and G Tube placed for persistent dysphagia.   Currently plan is arrange safe discharge plan to SNF. Pt seen and examined at bedside, no new complaints today.   Assessment & Plan:   Principal Problem:   CAP (community acquired pneumonia) Active Problems:   Acute metabolic encephalopathy   Severe sepsis with septic shock (HCC)   Neurogenic bladder   Chronic, continuous use of opioids   Chronic low back pain   Type 2 diabetes mellitus without complication (HCC)   Migraines   Recurrent UTI   History of colon polyps   Urinary retention   Altered mental status   Pressure injury of skin   AKI (acute kidney injury) ([place])   Malnutrition of moderate degree   Acute encephalopathy Secondary to anoxic brain injury Neurology consulted, suggested gradual improvement over time. Metabolic work-up so far negative. EEG is negative. He is alert and oriented and answering all questions appropriately.    Acute kidney injury associated with hypernatremia hyperkalemia and hyperphosphatemia Probably  secondary to ATN with underlying diabetic nephropathy Patient was on dialysis temporarily and nephrology believed that patient is not a good candidate for outpatient HD. Patient and family does not want to pursue further hemodialysis if renal function worsens.   Hyperkalemia secondary to renal failure. resolved 7/21 K5.4, Lokelma 10 g p.o. x2 doses given 7/24 K 4.1 wnl now  Monitor and treat accordingly.  Chronic pain syndrome Resume home medications    Type 2 diabetes mellitus with renal complications Continue with sliding scale insulin A1c 7.4 Insulin-dependent. CBG (last 3)  Recent Labs    07/28/20 0417 07/28/20 0751 07/28/20 1145  GLUCAP 106* 203* 87  No changes in meds. Pt currently on carb modified diet.      Essential hypertension Blood pressure parameters are optimal.    Anemia of chronic disease Transfuse to keep hemoglobin greater than 7.    Septic shock secondary to community-acquired pneumonia present on admission Acute severe hypoxic respiratory failure in the setting of COPD exacerbation Resolved at this time.  Completed the course of IV antibiotics.   He was initially intubated in ICU later on self extubated.   Hypercalcemia Will try gentle hydration and one dose of lasix to see if hypercalcemia improves.  PTH levels  reviewed , repeat labs are pending.    Anemia of chronic disease Hemoglobin around 8 and stable. Transfuse to keep hemoglobin greater than 7.    Pressure injury present on admission.  Pressure Injury 06/23/20 Coccyx Posterior;Mid Stage 2 -  Partial thickness loss of dermis presenting as a shallow open injury with a red, pink wound bed without slough. (Active)  06/23/20 1400  Location: Coccyx  Location  Orientation: Posterior;Mid  Staging: Stage 2 -  Partial thickness loss of dermis presenting as a shallow open injury with a red, pink wound bed without slough.  Wound Description (Comments):   Present on Admission: Yes  Has  foam dressing.    Severe Protein Calorie Malnutrition:  Pt is on tube feeds, but he is asking for chunky veges and fruits.  Recommend SLP evaluation to see if he can be on regular diet. Pt requesting to stop the tube feeds.      DVT prophylaxis: Heparin Code Status: Full code  Family Communication: None at bedside  disposition:   Status is: Inpatient  Remains inpatient appropriate because:Unsafe d/c plan  Dispo: The patient is from: Home              Anticipated d/c is to: SNF              Patient currently is medically stable to d/c.   Difficult to place patient Yes       Consultants:         [facility] s/p Intubation Neurology Nephrology, s/p HD     Subjective: No overnight issues, patient has generalized body ache, resumed home medications morphine MS Contin 15 twice daily and patient was concerned about his diet so started him on regular diet.  Denied any other active issues.    Objective: Vitals:   07/27/20 1943 07/28/20 0420 07/28/20 0751 07/28/20 1146  BP: (!) 99/53 104/66 (!) 114/59 (!) 100/46  Pulse: 72 73 73 82  Resp: _0 Temp: 98.4 F (36.9 C) 98.2 F (36.8 C) 98.2 F (36.8 C) 98 F (36.7 C)  TempSrc: Oral Oral  Oral  SpO2: 99% 100% 100% 97%  Weight:      Height:        Intake/Output Summary (Last 24 hours) at 07/28/2020 1230 Last data filed at 07/28/2020 1024 Gross per 24 hour  Intake 1200 ml  Output 4550 ml  Net -3350 ml   Filed Weights   07/25/20 0500 07/26/20 0500 07/27/20 0442  Weight: 83.3 kg 84.1 kg 82.7 kg    Examination:  General exam: elderly gentleman, not in distress.  Respiratory system: clear to auscultation, no wheezing heard.   Cardiovascular system: S1-S2 heard, RRR, no JVD, no pedal edema.  Gastrointestinal system: Abdomen is soft, NT ND BS+ Central nervous system: Alert and oriented, able to answering questions appropriately.  Extremities: No pedal edema Skin: stage 2 coccyx pressure injury  Psychiatry: Mood  is appropriate.     Data Reviewed: I have personally reviewed following labs and imaging studies  CBC: Recent Labs  Lab 07/22/20 0618 07/25/20 0554 07/26/20 0411 07/27/20 0529 07/28/20 0420  WBC 7.3 6.2 7.0 7.9 6.9  HGB 8.7* 8.1* 8.7* 8.0* 8.3*  HCT 26.3* 25.3* 26.1* 24.6* 25.8*  MCV 89.8 94.4 92.2 93.5 92.8  PLT 320 300 324 305 875    Basic Metabolic Panel: Recent Labs  Lab 07/23/20 0844 07/25/20 0554 07/26/20 0411 07/27/20 0529 07/28/20 0420  NA 136 133* 133* 135 136  K 5.4* 5.4* 4.3 4.1 4.1  CL 97* 97* 99 100 101  CO2 _1 GLUCOSE 110* 111* 115* 107* 104*  BUN 73* 53* 52* 49* 39*  CREATININE 2.87* 2.89* 3.08* 3.02* 3.59*  CALCIUM 10.4* 9.4 9.2 8.9 8.9  MG  --  2.6* 2.3 2.3  --   PHOS  --  3.1 4.1 4.1  --     GFR:  Estimated Creatinine Clearance: 21.4 mL/min (A) (by C-G formula based on SCr of 3.59 mg/dL (H)).  Liver Function Tests: No results for input(s): AST, ALT, ALKPHOS, BILITOT, PROT, ALBUMIN in the last 168 hours.  CBG: Recent Labs  Lab 07/27/20 1733 07/27/20 1947 07/28/20 0417 07/28/20 0751 07/28/20 1145  GLUCAP 158* 168* 106* 203* 87     No results found for this or any previous visit (from the past 240 hour(s)).       Radiology Studies: No results found.      Scheduled Meds:  amLODipine  10 mg Per Tube Daily   chlorhexidine gluconate (MEDLINE KIT)  15 mL Mouth Rinse BID   Chlorhexidine Gluconate Cloth  6 each Topical Daily   cloNIDine  0.3 mg Transdermal Weekly   feeding supplement (NEPRO CARB STEADY)  237 mL Oral BID BM   gabapentin  300 mg Oral QHS   And   gabapentin  100 mg Oral BID WC   heparin injection (subcutaneous)  5,000 Units Subcutaneous Q8H   insulin aspart  0-20 Units Subcutaneous TID AC & HS   insulin glargine  20 Units Subcutaneous Daily   metoprolol tartrate  100 mg Per Tube BID   morphine  15 mg Oral Q12H   multivitamin  1 tablet Per Tube QHS   nutrition supplement (JUVEN)  1 packet Per Tube  BID   pantoprazole  40 mg Oral Daily   prazosin  4 mg Per Tube QHS   QUEtiapine  25 mg Per Tube BID   traZODone  150 mg Per Tube QHS   Continuous Infusions:  sodium chloride Stopped (06/22/20 1614)     LOS: 46 days        Val Riles, MD [facility]   To contact the attending provider between 7A-7P or the covering provider during after hours 7P-7A, please log into the web site [email or web address] and access using universal [place] password for that web site. If you do not have the password, please call the hospital operator.  07/28/2020, 12:30 PM

## 2020-07-29 LAB — BASIC METABOLIC PANEL
Anion gap: 9 (ref 5–15)
BUN: 58 mg/dL — ABNORMAL HIGH (ref 8–23)
CO2: 23 mmol/L (ref 22–32)
Calcium: 8.8 mg/dL — ABNORMAL LOW (ref 8.9–10.3)
Chloride: 101 mmol/L (ref 98–111)
Creatinine, Ser: 3.28 mg/dL — ABNORMAL HIGH (ref 0.61–1.24)
GFR, Estimated: 21 mL/min — ABNORMAL LOW (ref >60)
Glucose, Bld: 137 mg/dL — ABNORMAL HIGH (ref 70–99)
Potassium: 5.1 mmol/L (ref 3.5–5.1)
Sodium: 133 mmol/L — ABNORMAL LOW (ref 135–145)

## 2020-07-29 LAB — CBC
HCT: 24 % — ABNORMAL LOW (ref 39.0–52.0)
Hemoglobin: 8 g/dL — ABNORMAL LOW (ref 13.0–17.0)
MCH: 31.4 pg (ref 26.0–34.0)
MCHC: 33.3 g/dL (ref 30.0–36.0)
MCV: 94.1 fL (ref 80.0–100.0)
Platelets: 280 10*3/uL (ref 150–400)
RBC: 2.55 MIL/uL — ABNORMAL LOW (ref 4.22–5.81)
RDW: 14.6 % (ref 11.5–15.5)
WBC: 8.8 10*3/uL (ref 4.0–10.5)
nRBC: 0 % (ref 0.0–0.2)

## 2020-07-29 LAB — GLUCOSE, CAPILLARY
Glucose-Capillary: 114 mg/dL — ABNORMAL HIGH (ref 70–99)
Glucose-Capillary: 123 mg/dL — ABNORMAL HIGH (ref 70–99)
Glucose-Capillary: 147 mg/dL — ABNORMAL HIGH (ref 70–99)
Glucose-Capillary: 135 mg/dL — ABNORMAL HIGH (ref 70–99)

## 2020-07-29 NOTE — Progress Notes (Signed)
PROGRESS NOTE    Wyatt Ball.  KWI:[id number] DOB: [birth year] DOA: 06/04/2020 PCP: System, Provider Not In    Brief Narrative:  HTN, type II DM, PTSD, migraine, neurogenic bladder, remote history of transverse myelitis, GERD.  Presents with complaints of confusion found to have septic shock, unknown organism with acute kidney injury, COPD exacerbation requiring intubation and ICU stay. 5/21 admitted for sepsis and pneumonia. 6/2 transferred to ICU, intubated underwent bronchoscopy 6/3 self extubated followed by reintubated 6/7 HD started 6/15 extubated 6/19 transferred to [facility]. Neurology, nephrology, urology and infectious disease consulted. 6/24 Recurrent Fevers, treated with short course of antibiotics. 6/26 Right PICC line placed. 6/27 HD stopped.  Goals of care discussed with palliative team 6/29 EGD and G Tube placed for persistent dysphagia.   Currently plan is arrange safe discharge plan to SNF.   Assessment & Plan:   Principal Problem:   CAP (community acquired pneumonia) Active Problems:   Acute metabolic encephalopathy   Severe sepsis with septic shock (HCC)   Neurogenic bladder   Chronic, continuous use of opioids   Chronic low back pain   Type 2 diabetes mellitus without complication (HCC)   Migraines   Recurrent UTI   History of colon polyps   Urinary retention   Altered mental status   Pressure injury of skin   AKI (acute kidney injury) ([place])   Malnutrition of moderate degree  Acute encephalopathy Secondary to anoxic brain injury Neurology consulted, suggested gradual improvement over time. Metabolic work-up so far negative. EEG is negative. He is alert and oriented and answering all questions appropriately.     Acute kidney injury associated with hypernatremia hyperkalemia and hyperphosphatemia Probably secondary to ATN with underlying diabetic nephropathy Patient was on dialysis temporarily and nephrology believed that patient is not a  good candidate for outpatient HD. Patient and family does not want to pursue further hemodialysis if renal function worsens. Creatinine has hovered in the threes. Continues to make urine Will monitor renal function     Hyperkalemia secondary to renal failure. 7/21 K5.4, Lokelma 10 g p.o. x2 doses given 7/24 K 4.1 wnl now Plan: Potassium 5.1 on 7/25.  Monitor for now.  If potassium remains elevated will dose Lokelma again   Chronic pain syndrome Resume home medications       Type 2 diabetes mellitus with renal complications Continue with sliding scale insulin A1c 7.4 Insulin-dependent.   DVT prophylaxis: SQ heparin Code Status: DNR Family Communication: None today  disposition Plan:Status is: Inpatient  Remains inpatient appropriate because:Inpatient level of care appropriate due to severity of illness  Dispo: The patient is from: Home              Anticipated d/c is to:  Unknown at this time              Patient currently is medically stable to d/c.   Difficult to place patient Yes       Level of care: Med-Surg  Consultants:  None  Procedures: (Don't include imaging studies which can be auto populated. Include things that cannot be auto populated i.e. Echo, Carotid and venous dopplers, Foley, Bipap, HD, tubes/drains, wound vac, central lines etc) None  Antimicrobials: (specify start and planned stop date. Auto populated tables are space occupying and do not give end dates) None   Subjective: Seen and examined.  Sitting in bed.  Tolerating p.o. intake.  No visible distress.  Objective: Vitals:   07/28/20 2147 07/29/20 0423 07/29/20 0827 07/29/20 1226  BP: 107/62 119/62 100/60 (!) 105/58  Pulse: 77 80 75 69  Resp: 18 18 18 16   Temp: 98.8 F (37.1 C) 98.5 F (36.9 C) 98.6 F (37 C) 98.7 F (37.1 C)  TempSrc:  Oral Oral   SpO2: 98% 100% 98% 100%  Weight:      Height:        Intake/Output Summary (Last 24 hours) at 07/29/2020 1325 Last data filed at  07/29/2020 1049 Gross per 24 hour  Intake 120 ml  Output 4750 ml  Net -4630 ml   Filed Weights   07/25/20 0500 07/26/20 0500 07/27/20 0442  Weight: 83.3 kg 84.1 kg 82.7 kg    Examination:  General exam: No acute distress.  Appears chronically ill Respiratory system: Clear to auscultation. Respiratory effort normal. Cardiovascular system: S1-S2, regular rate and rhythm, no murmurs  gastrointestinal system: Obese, nontender, nondistended, normal bowel sounds, PEG tube in place Central nervous system: Alert and oriented. No focal neurological deficits. Extremities: Symmetric 5 x 5 power. Skin: No rashes, lesions or ulcers Psychiatry: Judgement and insight appear normal. Mood & affect appropriate.     Data Reviewed: I have personally reviewed following labs and imaging studies  CBC: Recent Labs  Lab 07/25/20 0554 07/26/20 0411 07/27/20 0529 07/28/20 0420 07/29/20 0657  WBC 6.2 7.0 7.9 6.9 8.8  HGB 8.1* 8.7* 8.0* 8.3* 8.0*  HCT 25.3* 26.1* 24.6* 25.8* 24.0*  MCV 94.4 92.2 93.5 92.8 94.1  PLT 300 324 305 311 759   Basic Metabolic Panel: Recent Labs  Lab 07/25/20 0554 07/26/20 0411 07/27/20 0529 07/28/20 0420 07/29/20 0657  NA 133* 133* 135 136 133*  K 5.4* 4.3 4.1 4.1 5.1  CL 97* 99 100 101 101  CO2 26 26 26 27 23   GLUCOSE 111* 115* 107* 104* 137*  BUN 53* 52* 49* 39* 58*  CREATININE 2.89* 3.08* 3.02* 3.59* 3.28*  CALCIUM 9.4 9.2 8.9 8.9 8.8*  MG 2.6* 2.3 2.3  --   --   PHOS 3.1 4.1 4.1  --   --    GFR: Estimated Creatinine Clearance: 23.4 mL/min (A) (by C-G formula based on SCr of 3.28 mg/dL (H)). Liver Function Tests: No results for input(s): AST, ALT, ALKPHOS, BILITOT, PROT, ALBUMIN in the last 168 hours. No results for input(s): LIPASE, AMYLASE in the last 168 hours. No results for input(s): AMMONIA in the last 168 hours. Coagulation Profile: No results for input(s): INR, PROTIME in the last 168 hours. Cardiac Enzymes: No results for input(s): CKTOTAL,  CKMB, CKMBINDEX, TROPONINI in the last 168 hours. BNP (last 3 results) No results for input(s): PROBNP in the last 8760 hours. HbA1C: No results for input(s): HGBA1C in the last 72 hours. CBG: Recent Labs  Lab 07/28/20 1145 07/28/20 1709 07/28/20 2150 07/29/20 0824 07/29/20 1153  GLUCAP 87 137* 135* 114* 123*   Lipid Profile: No results for input(s): CHOL, HDL, LDLCALC, TRIG, CHOLHDL, LDLDIRECT in the last 72 hours. Thyroid Function Tests: No results for input(s): TSH, T4TOTAL, FREET4, T3FREE, THYROIDAB in the last 72 hours. Anemia Panel: No results for input(s): VITAMINB12, FOLATE, FERRITIN, TIBC, IRON, RETICCTPCT in the last 72 hours. Sepsis Labs: No results for input(s): PROCALCITON, LATICACIDVEN in the last 168 hours.  No results found for this or any previous visit (from the past 240 hour(s)).       Radiology Studies: No results found.      Scheduled Meds:  amLODipine  10 mg Per Tube Daily   chlorhexidine gluconate (MEDLINE KIT)  15 mL Mouth Rinse BID   Chlorhexidine Gluconate Cloth  6 each Topical Daily   cloNIDine  0.3 mg Transdermal Weekly   feeding supplement (NEPRO CARB STEADY)  237 mL Oral BID BM   gabapentin  300 mg Oral QHS   And   gabapentin  100 mg Oral BID WC   heparin injection (subcutaneous)  5,000 Units Subcutaneous Q8H   insulin aspart  0-20 Units Subcutaneous TID AC & HS   insulin glargine  20 Units Subcutaneous Daily   metoprolol tartrate  100 mg Per Tube BID   morphine  15 mg Oral Q12H   multivitamin  1 tablet Per Tube QHS   nutrition supplement (JUVEN)  1 packet Per Tube BID   pantoprazole  40 mg Oral Daily   prazosin  4 mg Per Tube QHS   QUEtiapine  25 mg Per Tube BID   traZODone  150 mg Per Tube QHS   Continuous Infusions:  sodium chloride Stopped (06/22/20 1614)     LOS: 55 days    Time spent: 25 minutes    Sidney Ace, MD [facility] Pager [phone]  If 7PM-7AM, please contact  night-coverage 07/29/2020, 1:25 PM

## 2020-07-29 NOTE — TOC Progression Note (Signed)
Transition of Care (TOC) - Progression Note    Patient Details  Name: Wyatt Ball. MRN: [id number] Date of Birth: 1960-09-22  Transition of Care [facility]) CM/SW Contact  Caryn Section, RN Phone Number: 07/29/2020, 2:06 PM  Clinical Narrative:   Revonda Standard from [facility] stated they cannot accept patient due to chances of his conversion to LTC without a payer contract.  Kristi from [facility], [address] [place], [place] [phone] is currently reviewing patient for possible admission  TOC contact information given to Kristi, awaiting response.   Note: patient's brother had previously accepted a facility in [place].    Expected Discharge Plan: Skilled Nursing Facility Barriers to Discharge: Continued Medical Work up  Expected Discharge Plan and Services Expected Discharge Plan: Skilled Nursing Facility In-house Referral: NA   Post Acute Care Choice: Skilled Nursing Facility Living arrangements for the past 2 months: Single Family Home                                       Social Determinants of Health (SDOH) Interventions    Readmission Risk Interventions No flowsheet data found.

## 2020-07-29 NOTE — Evaluation (Signed)
Occupational Therapy Re-evaluation Patient Details Name: Wyatt Ball. MRN: [id number] DOB: 09/29/1960 Today's Date: 07/29/2020    History of Present Illness Wyatt Ball Wyatt Ball. is a 60 y.o. male with medical history significant for HTN, PTSD, diabetes, migraines, GERD, neurogenic bladder secondary to remote history of transverse myelitis, who self catheterizes and has frequent UTIs as well as chronic back pain on chronic opiates who at baseline is independent, and very conversational who usually gets his care at the [place] a brought to the ER with altered mental status.  Since hospitalization pt has been intubated, finally extubated 6/15.   Clinical Impression   Pt seen for OT re-evaluation on this date. Upon arrival to room, pt awake following bathing with NT. Pt agreeable to OT session. Pt continues to present with decreased strength, balance, and activity tolerance, however is progressing well and has met 3/3 goals. Goals upgraded to reflect current progress. Pt currently requires MIN GUARD for bed mobility, MIN A for seated LB dressing with adaptive equipment, and MOD A for lateral scoot transfers bed>chair. Pt continues to fatigue quickly with minimal activity and would benefit from continued skilled OT services to maximize return to PLOF and minimize risk of future falls, injury, caregiver burden, and readmission. Will continue to follow POC. Frequency and discharge recommendation remains appropriate.      Follow Up Recommendations  SNF    Equipment Recommendations  Other (comment) (TBD)       Precautions / Restrictions Precautions Precautions: Fall Precaution Comments: PEG Restrictions Weight Bearing Restrictions: No      Mobility Bed Mobility Overal bed mobility: Needs Assistance Bed Mobility: Supine to Sit     Supine to sit: Min guard     General bed mobility comments: Pt was able to progress from semi-fowlers postion to sitting EOB with use of bed rails and  increased time/effort only. No physical assistance required from this [place] this date    Transfers Overall transfer level: Needs assistance Equipment used: Rolling walker (2 wheeled) Transfers: Sit to/from Stand;Lateral/Scoot Transfers Sit to Stand: Mod assist        Lateral/Scoot Transfers: Mod assist;From elevated surface General transfer comment: Pt able to transfer bed>chair with incremental lateral scoots    Balance Overall balance assessment: Needs assistance Sitting-balance support: No upper extremity supported;Feet supported Sitting balance-Leahy Scale: Good Sitting balance - Comments: Good static sitting balance at EOB   Standing balance support: Bilateral upper extremity supported;During functional activity Standing balance-Leahy Scale: Poor Standing balance comment: requires BUE support + MOD A to clear hips from bed                           ADL either performed or assessed with clinical judgement   ADL Overall ADL's : Needs assistance/impaired                     Lower Body Dressing: Minimal assistance;Sitting/lateral leans;With adaptive equipment Lower Body Dressing Details (indicate cue type and reason): While sitting in recliner with LE elevated, MIN A to don/doff socks using reacher. Pt fatigues quickly, and required x3 rest breaks to don socks                      Pertinent Vitals/Pain Pain Assessment: 0-10 Pain Score: 5  Pain Location: back Pain Descriptors / Indicators: Discomfort;Aching Pain Intervention(s): Monitored during session;Repositioned              Cognition Arousal/Alertness: Awake/alert  Behavior During Therapy: WFL for tasks assessed/performed Overall Cognitive Status: Impaired/Different from baseline Area of Impairment: Orientation;Following commands;Safety/judgement;Awareness;Problem solving;Memory                 Orientation Level: Disoriented to;Situation   Memory: Decreased short-term  memory Following Commands: Follows one step commands with increased time;Follows multi-step commands inconsistently Safety/Judgement: Decreased awareness of safety;Decreased awareness of deficits Awareness: Intellectual Problem Solving: Slow processing;Requires verbal cues;Requires tactile cues General Comments: Pt was alert and agreeable to tx. Pt continues to present w/ labile temperament, requesting increase time to perform ADLs/functional mobility        OT Treatment/Interventions:      OT Goals(Current goals can be found in the care plan section) Acute Rehab OT Goals Patient Stated Goal: to walk out of here OT Goal Formulation: With patient Time For Goal Achievement: 08/12/20 Potential to Achieve Goals: Fair ADL Goals Pt Will Perform Grooming: with set-up;with supervision;sitting Pt Will Perform Upper Body Dressing: with supervision;sitting Pt Will Transfer to Toilet: with modified independence;stand pivot transfer;bedside commode  OT Frequency: Min 1X/week       AM-PAC OT "6 Clicks" Daily Activity     Outcome Measure Help from another person eating meals?: A Little Help from another person taking care of personal grooming?: A Little Help from another person toileting, which includes using toliet, bedpan, or urinal?: A Lot Help from another person bathing (including washing, rinsing, drying)?: A Lot Help from another person to put on and taking off regular upper body clothing?: A Little Help from another person to put on and taking off regular lower body clothing?: A Lot 6 Click Score: 15   End of Session Equipment Utilized During Treatment: Gait belt;Rolling walker Nurse Communication: Mobility status  Activity Tolerance: Patient tolerated treatment well Patient left: with call bell/phone within reach;in chair;with chair alarm set  OT Visit Diagnosis: Other symptoms and signs involving cognitive function                Time: 1051-1120 OT Time Calculation (min): 29  min Charges:  OT General Charges $OT Visit: 1 Visit OT Evaluation $OT Re-eval: 1 Re-eval OT Treatments $Self Care/Home Management : 8-22 mins  Fredirick Maudlin, OTR/L [place]

## 2020-07-29 NOTE — Progress Notes (Signed)
Physical Therapy Treatment Patient Details Name: Wyatt Ball. MRN: [id number] DOB: April 13, 1960 Today's Date: 07/29/2020    History of Present Illness Wyatt Ball. is a 60 y.o. 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  Since hospitalization pt has been intubated, finally extubated 6/15.    PT Comments    Pt is making gradual progress towards goals with ability to perform multiple standing attempts this date, however fatigues with exertion and unable to transfer to recliner, request to return supine. Good endurance with there-ex. Incontinent BM noted with RN in room for assistance. Still requires +2 for mobility. Will continue to progress as able.   Follow Up Recommendations  SNF     Equipment Recommendations       Recommendations for Other Services       Precautions / Restrictions Precautions Precautions: Fall Precaution Comments: PEG Restrictions Weight Bearing Restrictions: No    Mobility  Bed Mobility Overal bed mobility: Needs Assistance Bed Mobility: Supine to Sit     Supine to sit: Min guard     General bed mobility comments: safe technique with no cues for initiation. Once seated, upright posture noted    Transfers Overall transfer level: Needs assistance Equipment used: Rolling walker (2 wheeled) Transfers: Sit to/from Stand;Lateral/Scoot Transfers Sit to Stand: Mod assist;+2 physical assistance        Lateral/Scoot Transfers: Mod assist;From elevated surface General transfer comment: 3-4 attempts with pt able to perform transfer slowly, needing bed elevated prior to performance. During each attempt, only  able to stand x 20-30 seconds prior to fatigue. RW used. Needs +2 for safety  Ambulation/Gait             General Gait Details: unable due to fatigue   Stairs             Wheelchair Mobility    Modified Rankin (Stroke Patients Only)       Balance Overall balance assessment: Needs assistance Sitting-balance support: No upper extremity supported;Feet supported Sitting balance-Leahy Scale: Good Sitting balance - Comments: Good static sitting balance at EOB   Standing balance support: Bilateral upper extremity supported;During functional activity Standing balance-Leahy Scale: Poor Standing balance comment: requires BUE support + MOD A to clear hips from bed                            Cognition Arousal/Alertness: Awake/alert Behavior During Therapy: WFL for tasks assessed/performed Overall Cognitive Status: Impaired/Different from baseline Area of Impairment: Orientation;Following commands;Safety/judgement;Awareness;Problem solving;Memory                 Orientation Level: Disoriented to;Situation   Memory: Decreased short-term memory Following Commands: Follows one step commands with increased time;Follows multi-step commands inconsistently Safety/Judgement: Decreased awareness of safety;Decreased awareness of deficits Awareness: Intellectual Problem Solving: Slow processing;Requires verbal cues;Requires tactile cues General Comments: alert and agreeable to session, fatigues quickly      Exercises Other Exercises Other Exercises: Supine/seated ther-ex performed on B LE including SLRs, SAQ, hip abd/ad, hip add squeezes, and LAQ. 10 reps performed with rest breaks as needed Other Exercises: Pt with incontient episode requiring multiple stands for peri care and hygiene.  New sacral patch donned    General Comments        Pertinent Vitals/Pain Pain Assessment: No/denies pain    Home Living                      Prior Function             PT Goals (current goals can now be found in the care plan section) Acute Rehab PT Goals Patient Stated Goal: to walk out of here PT Goal Formulation: With patient Time For Goal Achievement: 08/05/20 Potential to Achieve Goals: Fair Progress towards PT goals: Progressing toward goals    Frequency    Min 2X/week      PT Plan Current plan remains appropriate    Co-evaluation              AM-PAC PT "6 Clicks" Mobility   Outcome Measure  Help needed turning from your back to your side while in a flat bed without using bedrails?: A Little Help needed moving from lying on your back to sitting on the side of a flat bed without using bedrails?: A Little Help needed moving to and from a bed to a chair (including a wheelchair)?: A Lot Help needed standing up from a chair using your arms (e.g., wheelchair or bedside chair)?: A Lot Help needed to walk in hospital room?: A Lot Help needed climbing 3-5 steps with a railing? : Total 6 Click Score: 13    End of Session Equipment Utilized During Treatment: Gait belt Activity Tolerance: Patient limited by fatigue Patient left: in bed;with bed alarm set;with family/visitor present Nurse Communication: Mobility status PT Visit Diagnosis: Muscle weakness (generalized) (M62.81);Adult, failure to thrive (R62.7);Pain     Time: 1010-1041 PT Time Calculation (min) (ACUTE ONLY): 31 min  Charges:  $Therapeutic Exercise: 8-22 mins $Therapeutic Activity: 8-22 mins                     Elizabeth Palau, PT, DPT [phone]    Johnothan Bascomb 07/29/2020, 3:54 PM

## 2020-07-30 DIAGNOSIS — Z09 Encounter for follow-up examination after completed treatment for conditions other than malignant neoplasm: Secondary | ICD-10-CM

## 2020-07-30 LAB — GLUCOSE, CAPILLARY
Glucose-Capillary: 87 mg/dL (ref 70–99)
Glucose-Capillary: 145 mg/dL — ABNORMAL HIGH (ref 70–99)
Glucose-Capillary: 141 mg/dL — ABNORMAL HIGH (ref 70–99)
Glucose-Capillary: 142 mg/dL — ABNORMAL HIGH (ref 70–99)

## 2020-07-30 LAB — BASIC METABOLIC PANEL
Anion gap: 8 (ref 5–15)
BUN: 54 mg/dL — ABNORMAL HIGH (ref 8–23)
CO2: 28 mmol/L (ref 22–32)
Calcium: 8.7 mg/dL — ABNORMAL LOW (ref 8.9–10.3)
Chloride: 100 mmol/L (ref 98–111)
Creatinine, Ser: 3.35 mg/dL — ABNORMAL HIGH (ref 0.61–1.24)
GFR, Estimated: 20 mL/min — ABNORMAL LOW (ref >60)
Glucose, Bld: 92 mg/dL (ref 70–99)
Potassium: 4.8 mmol/L (ref 3.5–5.1)
Sodium: 136 mmol/L (ref 135–145)

## 2020-07-30 NOTE — Progress Notes (Signed)
PROGRESS NOTE    Wyatt Ball.  CWC:[id number] DOB: Nov 25, 1960 DOA: 06/04/2020 PCP: System, Provider Not In    Brief Narrative:  HTN, type II DM, PTSD, migraine, neurogenic bladder, remote history of transverse myelitis, GERD.  Presents with complaints of confusion found to have septic shock, unknown organism with acute kidney injury, COPD exacerbation requiring intubation and ICU stay. 5/21 admitted for sepsis and pneumonia. 6/2 transferred to ICU, intubated underwent bronchoscopy 6/3 self extubated followed by reintubated 6/7 HD started 6/15 extubated 6/19 transferred to [facility] Neurology, nephrology, urology and infectious disease consulted. 6/24 Recurrent Fevers, treated with short course of antibiotics. 6/26 Right PICC line placed. 6/27 HD stopped.  Goals of care discussed with palliative team 6/29 EGD and G Tube placed for persistent dysphagia.   Currently plan is arrange safe discharge to SNF.  Discharge delayed by lack of safe disposition plan   Assessment & Plan:   Principal Problem:   CAP (community acquired pneumonia) Active Problems:   Acute metabolic encephalopathy   Severe sepsis with septic shock (HCC)   Neurogenic bladder   Chronic, continuous use of opioids   Chronic low back pain   Type 2 diabetes mellitus without complication (HCC)   Migraines   Recurrent UTI   History of colon polyps   Urinary retention   Altered mental status   Pressure injury of skin   AKI (acute kidney injury) ([place])   Malnutrition of moderate degree  Acute encephalopathy Secondary to anoxic brain injury Neurology consulted, suggested gradual improvement over time. Metabolic work-up so far negative. EEG is negative. He is alert and oriented and answering all questions appropriately. No longer need for PEG tube Plan: Discussed with GI.  They will remove PEG at bedside today 7/26     Acute kidney injury associated with hypernatremia hyperkalemia and  hyperphosphatemia Probably secondary to ATN with underlying diabetic nephropathy Patient was on dialysis temporarily and nephrology believed that patient is not a good candidate for outpatient HD. Patient and family does not want to pursue further hemodialysis if renal function worsens. Creatinine has hovered in the threes. Continues to make urine Will monitor renal function     Hyperkalemia secondary to renal failure. 7/21 K5.4, Lokelma 10 g p.o. x2 doses given 7/24 K 4.1 wnl now Plan: Potassium 4.8 on 7/26.  Kidney function stable.   Chronic pain syndrome Resume home medications       Type 2 diabetes mellitus with renal complications Continue with sliding scale insulin A1c 7.4 Insulin-dependent.   DVT prophylaxis: SQ heparin Code Status: DNR Family Communication: None today  disposition Plan:Status is: Inpatient  Remains inpatient appropriate because:Inpatient level of care appropriate due to severity of illness  Dispo: The patient is from: Home              Anticipated d/c is to:  Unknown at this time              Patient currently is medically stable to d/c.   Difficult to place patient Yes  Patient remains medically stable for discharge.  Discharge delayed by lack of safe disposition plan.     Level of care: Med-Surg  Consultants:  None  Procedures: None  Antimicrobials:  None   Subjective: Seen and examined.  Sitting in bed.  Tolerating p.o. intake.  No visible distress.  Objective: Vitals:   07/29/20 2130 07/30/20 0436 07/30/20 0500 07/30/20 0740  BP: 111/66 96/63  (!) 103/56  Pulse: 74 70  76  Resp:  18  16  Temp:  98.6 F (37 C)  99.3 F (37.4 C)  TempSrc:      SpO2:  97%  95%  Weight:   61 kg   Height:        Intake/Output Summary (Last 24 hours) at 07/30/2020 1106 Last data filed at 07/30/2020 0435 Gross per 24 hour  Intake 240 ml  Output 2400 ml  Net -2160 ml   Filed Weights   07/26/20 0500 07/27/20 0442 07/30/20 0500   Weight: 84.1 kg 82.7 kg 61 kg    Examination:  General exam: No acute distress.  Appears chronically ill Respiratory system: Clear to auscultation. Respiratory effort normal. Cardiovascular system: S1-S2, regular rate and rhythm, no murmurs  gastrointestinal system: Obese, nontender, nondistended, normal bowel sounds, PEG tube in place Central nervous system: Alert and oriented. No focal neurological deficits. Extremities: Symmetric 5 x 5 power. Skin: No rashes, lesions or ulcers Psychiatry: Judgement and insight appear normal. Mood & affect appropriate.     Data Reviewed: I have personally reviewed following labs and imaging studies  CBC: Recent Labs  Lab 07/25/20 0554 07/26/20 0411 07/27/20 0529 07/28/20 0420 07/29/20 0657  WBC 6.2 7.0 7.9 6.9 8.8  HGB 8.1* 8.7* 8.0* 8.3* 8.0*  HCT 25.3* 26.1* 24.6* 25.8* 24.0*  MCV 94.4 92.2 93.5 92.8 94.1  PLT 300 324 305 311 633   Basic Metabolic Panel: Recent Labs  Lab 07/25/20 0554 07/26/20 0411 07/27/20 0529 07/28/20 0420 07/29/20 0657 07/30/20 0834  NA 133* 133* 135 136 133* 136  K 5.4* 4.3 4.1 4.1 5.1 4.8  CL 97* 99 100 101 101 100  CO2 _0 GLUCOSE 111* 115* 107* 104* 137* 92  BUN 53* 52* 49* 39* 58* 54*  CREATININE 2.89* 3.08* 3.02* 3.59* 3.28* 3.35*  CALCIUM 9.4 9.2 8.9 8.9 8.8* 8.7*  MG 2.6* 2.3 2.3  --   --   --   PHOS 3.1 4.1 4.1  --   --   --    GFR: Estimated Creatinine Clearance: 20 mL/min (A) (by C-G formula based on SCr of 3.35 mg/dL (H)). Liver Function Tests: No results for input(s): AST, ALT, ALKPHOS, BILITOT, PROT, ALBUMIN in the last 168 hours. No results for input(s): LIPASE, AMYLASE in the last 168 hours. No results for input(s): AMMONIA in the last 168 hours. Coagulation Profile: No results for input(s): INR, PROTIME in the last 168 hours. Cardiac Enzymes: No results for input(s): CKTOTAL, CKMB, CKMBINDEX, TROPONINI in the last 168 hours. BNP (last 3 results) No results for  input(s): PROBNP in the last 8760 hours. HbA1C: No results for input(s): HGBA1C in the last 72 hours. CBG: Recent Labs  Lab 07/29/20 0824 07/29/20 1153 07/29/20 1646 07/29/20 2012 07/30/20 0742  GLUCAP 114* 123* 147* 135* 87   Lipid Profile: No results for input(s): CHOL, HDL, LDLCALC, TRIG, CHOLHDL, LDLDIRECT in the last 72 hours. Thyroid Function Tests: No results for input(s): TSH, T4TOTAL, FREET4, T3FREE, THYROIDAB in the last 72 hours. Anemia Panel: No results for input(s): VITAMINB12, FOLATE, FERRITIN, TIBC, IRON, RETICCTPCT in the last 72 hours. Sepsis Labs: No results for input(s): PROCALCITON, LATICACIDVEN in the last 168 hours.  No results found for this or any previous visit (from the past 240 hour(s)).       Radiology Studies: No results found.      Scheduled Meds:  amLODipine  10 mg Per Tube Daily   chlorhexidine gluconate (MEDLINE KIT)  15 mL Mouth Rinse BID  Chlorhexidine Gluconate Cloth  6 each Topical Daily   cloNIDine  0.3 mg Transdermal Weekly   feeding supplement (NEPRO CARB STEADY)  237 mL Oral BID BM   gabapentin  300 mg Oral QHS   And   gabapentin  100 mg Oral BID WC   heparin injection (subcutaneous)  5,000 Units Subcutaneous Q8H   insulin aspart  0-20 Units Subcutaneous TID AC & HS   insulin glargine  20 Units Subcutaneous Daily   metoprolol tartrate  100 mg Per Tube BID   morphine  15 mg Oral Q12H   multivitamin  1 tablet Per Tube QHS   nutrition supplement (JUVEN)  1 packet Per Tube BID   pantoprazole  40 mg Oral Daily   prazosin  4 mg Per Tube QHS   QUEtiapine  25 mg Per Tube BID   traZODone  150 mg Per Tube QHS   Continuous Infusions:  sodium chloride Stopped (06/22/20 1614)     LOS: 56 days    Time spent: 15 minutes    Sidney Ace, MD [facility] Pager [phone]  If 7PM-7AM, please contact night-coverage 07/30/2020, 11:06 AM

## 2020-07-30 NOTE — TOC Progression Note (Signed)
Transition of Care (TOC) - Progression Note    Patient Details  Name: Wyatt Ball. MRN: [id number] Date of Birth: [birth year]  Transition of Care [facility]) CM/SW Contact  Caryn Section, RN Phone Number: 07/30/2020, 3:08 PM  Clinical Narrative:   As per Silva Bandy at [facility] [phone], she received this patient information and she will call back later today with response (at 9am)  As of 15:00 not response Message left for Kristi.    Expected Discharge Plan: Skilled Nursing Facility Barriers to Discharge: Continued Medical Work up  Expected Discharge Plan and Services Expected Discharge Plan: Skilled Nursing Facility In-house Referral: NA   Post Acute Care Choice: Skilled Nursing Facility Living arrangements for the past 2 months: Single Family Home                                       Social Determinants of Health (SDOH) Interventions    Readmission Risk Interventions No flowsheet data found.

## 2020-07-30 NOTE — Progress Notes (Signed)
   Wyatt Ball , MD [address], [place], [place], [place], [address] [address] [place], [place], [place], [address] Phone: [phone]  Fax: [phone]   Wyatt Ball Wyatt Ball. is being followed for  G tube  Subjective: Contacted by Dr Priscella Mann for G tube removal as patient eating and drinking well. Patient has no complaints.    Objective: Vital signs in last 24 hours: Vitals:   07/30/20 0436 07/30/20 0500 07/30/20 0740 07/30/20 1124  BP: 96/63  (!) 103/56 (!) 97/59  Pulse: 70  76 74  Resp: $Remo'18  16 18  'XjuwQ$ Temp: 98.6 F (37 C)  99.3 F (37.4 C) 98.7 F (37.1 C)  TempSrc:      SpO2: 97%  95% 99%  Weight:  61 kg    Height:       Weight change:   Intake/Output Summary (Last 24 hours) at 07/30/2020 1321 Last data filed at 07/30/2020 0435 Gross per 24 hour  Intake 240 ml  Output 2400 ml  Net -2160 ml     Exam:  Abdomen: G tube noted, site looks clean, tube pulled out and dressing applied. No bleeding or pain after tube being pulled out  .    Lab Results: $RemoveBefo'@LABTEST2'tjwnWsFaegG$ @ [facility] Results: No results found for this or any previous visit (from the past 240 hour(s)). Studies/Results: No results found. Medications: I have reviewed the patient's current medications. Scheduled Meds:  amLODipine  10 mg Per Tube Daily   chlorhexidine gluconate (MEDLINE KIT)  15 mL Mouth Rinse BID   Chlorhexidine Gluconate Cloth  6 each Topical Daily   cloNIDine  0.3 mg Transdermal Weekly   feeding supplement (NEPRO CARB STEADY)  237 mL Oral BID BM   gabapentin  300 mg Oral QHS   And   gabapentin  100 mg Oral BID WC   heparin injection (subcutaneous)  5,000 Units Subcutaneous Q8H   insulin aspart  0-20 Units Subcutaneous TID AC & HS   insulin glargine  20 Units Subcutaneous Daily   metoprolol tartrate  100 mg Per Tube BID   morphine  15 mg Oral Q12H   multivitamin  1 tablet Per Tube QHS   nutrition supplement (JUVEN)  1 packet Per Tube BID   pantoprazole  40 mg Oral Daily   prazosin   4 mg Per Tube QHS   QUEtiapine  25 mg Per Tube BID   traZODone  150 mg Per Tube QHS   Continuous Infusions:  sodium chloride Stopped (06/22/20 1614)   PRN Meds:.sodium chloride, alum & mag hydroxide-simeth, haloperidol lactate, heparin sodium (porcine), heparin sodium (porcine), hydrALAZINE, labetalol, ondansetron **OR** ondansetron (ZOFRAN) IV, oxyCODONE-acetaminophen, prazosin, sodium chloride flush, traMADol   Assessment: Principal Problem:   CAP (community acquired pneumonia) Active Problems:   Acute metabolic encephalopathy   Severe sepsis with septic shock (HCC)   Neurogenic bladder   Chronic, continuous use of opioids   Chronic low back pain   Type 2 diabetes mellitus without complication (HCC)   Migraines   Recurrent UTI   History of colon polyps   Urinary retention   Altered mental status   Pressure injury of skin   AKI (acute kidney injury) ([place])   Malnutrition of moderate degree   I have been asked to take out G tube which was placed 4 weeks back . Tube pulled out and dressing applied after patients verbal consent.     LOS: 5 days   Wyatt Bellows, MD 07/30/2020, 1:21 PM

## 2020-07-31 LAB — GLUCOSE, CAPILLARY
Glucose-Capillary: 153 mg/dL — ABNORMAL HIGH (ref 70–99)
Glucose-Capillary: 159 mg/dL — ABNORMAL HIGH (ref 70–99)
Glucose-Capillary: 193 mg/dL — ABNORMAL HIGH (ref 70–99)
Glucose-Capillary: 152 mg/dL — ABNORMAL HIGH (ref 70–99)

## 2020-07-31 NOTE — Progress Notes (Signed)
Physical Therapy Treatment Patient Details Name: Wyatt Ball. MRN: [id number] DOB: 04/20/1960 Today's Date: 07/31/2020    History of Present Illness Wyatt Ball. is a 60 y.o. 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  Since hospitalization pt has been intubated, finally extubated 6/15.    PT Comments    Seen per request of nursing staff for assist with return to bed from [facility].  Completes sit/stand and SPT with RW, max assist +1-2 during session.  Maintains partial stand 15-20 seconds prior to spontaneous return to sitting; unable to acheive full bilat hip, knee and trunk extension despite max facilitation from therapist.  Fatigues quickly and quick to self-terminate activity as challenge/demand increases. Left in care of CNA to complete ADL and set up bed.   Follow Up Recommendations  SNF     Equipment Recommendations  None recommended by PT    Recommendations for Other Services       Precautions / Restrictions Precautions Precautions: Fall Restrictions Weight Bearing Restrictions: No    Mobility  Bed Mobility Overal bed mobility: Needs Assistance Bed Mobility: Sit to Supine Rolling: Supervision   Supine to sit: Min guard Sit to supine: Min guard   General bed mobility comments: self-assisting LEs into bed as needed    Transfers Overall transfer level: Needs assistance Equipment used: Rolling walker (2 wheeled) Transfers: Sit to/from Stand Sit to Stand: Max assist         General transfer comment: constant cuing for hand placement; extensive assist for lift off, postural extension, bilat hip/knee extension.   Unable to acheive full truncal extension despite cuing  Ambulation/Gait             General Gait Details: unsafe/unable   Stairs             Wheelchair Mobility    Modified Rankin (Stroke Patients Only)       Balance Overall balance assessment: Needs assistance Sitting-balance support: No upper extremity supported;Feet supported Sitting balance-Leahy Scale: Fair Sitting balance - Comments: Good initial static sitting balance at EOB, however fatigues quickly, requiring bilateral UE support overtime Postural control: Other (comment) (forward lean) Standing balance support: Bilateral upper extremity supported Standing balance-Leahy Scale: Zero Standing balance comment: requires BUE support + MAX A+2 to maintain standing position for ~10sec                            Cognition Arousal/Alertness: Awake/alert Behavior During Therapy: WFL for tasks assessed/performed Overall Cognitive Status: Impaired/Different from baseline Area of Impairment: Orientation;Following commands;Safety/judgement;Awareness;Problem solving;Memory                 Orientation Level: Disoriented to;Situation   Memory: Decreased short-term memory Following Commands: Follows one step commands with increased time;Follows multi-step commands inconsistently Safety/Judgement: Decreased awareness of safety;Decreased awareness of deficits Awareness: Intellectual Problem Solving: Slow processing;Requires verbal cues;Requires tactile cues General Comments: agreeable to session, poor carryover      Exercises Other Exercises Other Exercises: Sit/stand from [facility] with RW, max assist +2 for two repetitions.  Maintains partial stand 15-20 seconds prior to spontaneous return to sitting; unable to acheive full bilat hip, knee and  trunk extension despite max facilitation from therapist.  Fatigues quickly and quick to self-terminate activity as challenge/demand increases. Other Exercises:  BSC/bed transfer, SPT with RW, max assist +2--poor standing posture/tolerance, tends to drape self over front of walker (despite cuing); ultimately attempting sit prior to full alignment with seating surface (requiring max/total assist to ensure buttocks on bed).  Poor eccentric control, poor activity tolerance    General Comments        Pertinent Vitals/Pain Pain Assessment: Faces Faces Pain Scale: Hurts little more Pain Location: generalized soreness Pain Descriptors / Indicators: Grimacing;Moaning Pain Intervention(s): Limited activity within patient's tolerance;Monitored during session;Repositioned    Home Living                      Prior Function            PT Goals (current goals can now be found in the care plan section) Acute Rehab PT Goals Patient Stated Goal: to walk out of here PT Goal Formulation: With patient Time For Goal Achievement: 08/05/20 Potential to Achieve Goals: Fair Progress towards PT goals: Progressing toward goals    Frequency    Min 2X/week      PT Plan Current plan remains appropriate    Co-evaluation PT/OT/SLP Co-Evaluation/Treatment: Yes Reason for Co-Treatment: For patient/therapist safety;To address functional/ADL transfers PT goals addressed during session: Mobility/safety with mobility;Balance;Proper use of DME OT goals addressed during session: ADL's and self-care      AM-PAC PT "6 Clicks" Mobility   Outcome Measure  Help needed turning from your back to your side while in a flat bed without using bedrails?: A Little Help needed moving from lying on your back to sitting on the side of a flat bed without using bedrails?: A Little Help needed moving to and from a bed to a chair (including a wheelchair)?: A Lot Help needed standing up from a chair using your arms (e.g., wheelchair or bedside chair)?: A Lot Help needed to walk in hospital room?: Total Help needed climbing 3-5 steps with a railing? : Total 6 Click Score:  12    End of Session Equipment Utilized During Treatment: Gait belt Activity Tolerance: Patient limited by fatigue Patient left: in bed;with call bell/phone within reach;with bed alarm set Nurse Communication: Mobility status PT Visit Diagnosis: Muscle weakness (generalized) (M62.81);Adult, failure to thrive (R62.7);Pain     Time: 1610-9604 PT Time Calculation (min) (ACUTE ONLY): 12 min  Charges:  $Therapeutic Activity: 8-22 mins                     Carie Kapuscinski H. Manson Passey, PT, DPT, NCS 07/31/20, 7:10 PM [phone]

## 2020-07-31 NOTE — TOC Progression Note (Signed)
Transition of Care (TOC) - Progression Note    Patient Details  Name: Wyatt Ball. MRN: [id number] Date of Birth: December 06, 1960  Transition of [facility]) CM/SW Contact  Caryn Section, RN Phone Number: 07/31/2020, 2:56 PM  Clinical Narrative:   [facility] admissions-state they have a bed for patient tomorrow.  Contacted patient and brother to notify them, left message for Wyatt Ball at [facility] to arrange transportation to facility as they would need a company with [facility].      Expected Discharge Plan: Skilled Nursing Facility Barriers to Discharge: Continued Medical Work up  Expected Discharge Plan and Services Expected Discharge Plan: Skilled Nursing Facility In-house Referral: NA   Post Acute Care Choice: Skilled Nursing Facility Living arrangements for the past 2 months: Single Family Home                                       Social Determinants of Health (SDOH) Interventions    Readmission Risk Interventions No flowsheet data found.

## 2020-07-31 NOTE — Progress Notes (Signed)
Pt. Refusing to wear patient ID band or DNR band. Patient educated on concerns of not wearing, example, CPR being provided when that is not his wishes. Pt states "that's the least of my worries right now". MD notified.

## 2020-07-31 NOTE — Progress Notes (Signed)
Physical Therapy Treatment Patient Details Name: Wyatt Ball. MRN: [id number] DOB: March 21, 1960 Today's Date: 07/31/2020    History of Present Illness Wyatt Garbe [place] Montez Hageman. is a 60 y.o. 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  Since hospitalization pt has been intubated, finally extubated 6/15.    PT Comments    Pt is making gradual progress towards goals with willingness to participate, however poor tolerance for standing, still requiring +2 for all attempts at OOB. Pt with incontinent stool with rolling multiple time for hygiene. Will continue to progress as able.   Follow Up Recommendations  SNF     Equipment Recommendations  None recommended by PT    Recommendations for Other Services       Precautions / Restrictions Precautions Precautions: Fall Restrictions Weight Bearing Restrictions: No    Mobility  Bed Mobility Overal bed mobility: Needs Assistance Bed Mobility: Supine to Sit;Sit to Supine;Rolling Rolling: Supervision   Supine to sit: Min guard Sit to supine: Min guard   General bed mobility comments: With use of bedrails and increased time/effortm, pt able to perform bed mobility with no physical assist    Transfers Overall transfer level: Needs assistance Equipment used: Rolling walker (2 wheeled) Transfers: Sit to/from Stand Sit to Stand: Max assist;+2 physical assistance         General transfer comment: x4 attempts. MAX verbal cues for safe hand position. Once standing, pt locks out B knees and begins to flex at hips, folding over onto AD. Pt then slowly fades down to bed with decreased eccentric control.  Reports increased nausea with exertion. Unable to further transfer off bed at this time.  Ambulation/Gait             General Gait Details: unable due to fatigue   Stairs             Wheelchair Mobility    Modified Rankin (Stroke Patients Only)       Balance Overall balance assessment: Needs assistance Sitting-balance support: No upper extremity supported;Feet supported Sitting balance-Leahy Scale: Good Sitting balance - Comments: Good initial static sitting balance at EOB, however fatigues quickly, requiring bilateral UE support overtime Postural control: Other (comment) (forward lean) Standing balance support: Bilateral upper extremity supported;During functional activity Standing balance-Leahy Scale: Zero Standing balance comment: requires BUE support + MAX A+2 to maintain standing position for ~10sec                            Cognition Arousal/Alertness: Awake/alert Behavior During Therapy: WFL for tasks assessed/performed Overall Cognitive Status: Impaired/Different from baseline Area of Impairment: Orientation;Following commands;Safety/judgement;Awareness;Problem solving;Memory                 Orientation Level: Disoriented to;Situation   Memory: Decreased short-term memory Following Commands: Follows one step commands with increased time;Follows multi-step commands inconsistently Safety/Judgement: Decreased awareness of safety;Decreased awareness of deficits Awareness: Intellectual Problem Solving: Slow processing;Requires verbal cues;Requires tactile cues General Comments: agreeable to session, poor carryover      Exercises Other Exercises Other Exercises: rolling to B sides performed multiple times for hygiene due to incontient episode. Max/total assist for hygiene.  General Comments        Pertinent Vitals/Pain Pain Assessment: No/denies pain    Home Living                      Prior Function            PT  Goals (current goals can now be found in the care plan section) Acute Rehab PT Goals Patient Stated Goal: to walk out of here PT Goal Formulation: With patient Time For Goal Achievement: 08/05/20 Potential to Achieve Goals: Fair Progress towards PT goals: Progressing toward goals    Frequency    Min 2X/week      PT Plan Current plan remains appropriate    Co-evaluation PT/OT/SLP Co-Evaluation/Treatment: Yes Reason for Co-Treatment: For patient/therapist safety;To address functional/ADL transfers PT goals addressed during session: Mobility/safety with mobility;Balance;Proper use of DME OT goals addressed during session: ADL's and self-care      AM-PAC PT "6 Clicks" Mobility   Outcome Measure  Help needed turning from your back to your side while in a flat bed without using bedrails?: A Little Help needed moving from lying on your back to sitting on the side of a flat bed without using bedrails?: A Little Help needed moving to and from a bed to a chair (including a wheelchair)?: A Lot Help needed standing up from a chair using your arms (e.g., wheelchair or bedside chair)?: A Lot Help needed to walk in hospital room?: A Lot Help needed climbing 3-5 steps with a railing? : Total 6 Click Score: 13    End of Session Equipment Utilized During Treatment: Gait belt Activity Tolerance: Patient limited by fatigue Patient left: in bed;with bed alarm set Nurse Communication: Mobility status PT Visit Diagnosis: Muscle weakness (generalized) (M62.81);Adult, failure to thrive (R62.7);Pain     Time: 6073-7106 PT Time Calculation (min) (ACUTE ONLY): 40 min  Charges:  $Therapeutic Activity: 23-37 mins                     Elizabeth Palau, PT, DPT [phone]    Wyatt Ball 07/31/2020, 4:42 PM

## 2020-07-31 NOTE — Progress Notes (Signed)
PROGRESS NOTE    Wyatt Ball [employer].  SJG:[id number] DOB: [birth year] DOA: 06/04/2020 PCP: System, Provider Not In    Brief Narrative:  HTN, type II DM, PTSD, migraine, neurogenic bladder, remote history of transverse myelitis, GERD.  Presents with complaints of confusion found to have septic shock, unknown organism with acute kidney injury, COPD exacerbation requiring intubation and ICU stay. 5/21 admitted for sepsis and pneumonia. 6/2 transferred to ICU, intubated underwent bronchoscopy 6/3 self extubated followed by reintubated 6/7 HD started 6/15 extubated 6/19 transferred to [facility]. Neurology, nephrology, urology and infectious disease consulted. 6/24 Recurrent Fevers, treated with short course of antibiotics. 6/26 Right PICC line placed. 6/27 HD stopped.  Goals of care discussed with palliative team 6/29 EGD and G Tube placed for persistent dysphagia.   Currently plan is arrange safe discharge to SNF.  Discharge delayed by lack of safe disposition plan.  Reengaged GI.  Removed PEG tube at bedside on 7/26   Assessment & Plan:   Principal Problem:   CAP (community acquired pneumonia) Active Problems:   Acute metabolic encephalopathy   Severe sepsis with septic shock (HCC)   Neurogenic bladder   Chronic, continuous use of opioids   Chronic low back pain   Type 2 diabetes mellitus without complication (HCC)   Migraines   Recurrent UTI   History of colon polyps   Urinary retention   Altered mental status   Pressure injury of skin   AKI (acute kidney injury) ([place])   Malnutrition of moderate degree  Acute encephalopathy Secondary to anoxic brain injury Neurology consulted, suggested gradual improvement over time. Metabolic work-up so far negative. EEG is negative. He is alert and oriented and answering all questions appropriately. No longer need for PEG tube Plan: PEG tube removed 7/26.  Diet as tolerated     Acute kidney injury associated with hypernatremia  hyperkalemia and hyperphosphatemia Probably secondary to ATN with underlying diabetic nephropathy Patient was on dialysis temporarily and nephrology believed that patient is not a good candidate for outpatient HD. Patient and family does not want to pursue further hemodialysis if renal function worsens. Creatinine has hovered in the threes. Continues to make urine Will monitor renal function     Hyperkalemia secondary to renal failure. 7/21 K5.4, Lokelma 10 g p.o. x2 doses given 7/24 K 4.1 wnl now Plan: Potassium 4.8 on 7/26.  Kidney function stable. Check kidney function intermittently   Chronic pain syndrome Resume home medications       Type 2 diabetes mellitus with renal complications Continue with sliding scale insulin A1c 7.4 Insulin-dependent.   DVT prophylaxis: SQ heparin Code Status: DNR Family Communication: None today  disposition Plan:Status is: Inpatient  Remains inpatient appropriate because:Inpatient level of care appropriate due to severity of illness  Dispo: The patient is from: Home              Anticipated d/c is to:  Unknown at this time              Patient currently is medically stable to d/c.   Difficult to place patient Yes  Patient remains medically stable for discharge.  Discharge delayed by lack of safe disposition plan.     Level of care: Med-Surg  Consultants:  None  Procedures: None  Antimicrobials:  None   Subjective: Seen and examined.  Sitting in bed.  Tolerating p.o. intake.  No visible distress.  Objective: Vitals:   07/31/20 0451 07/31/20 0500 07/31/20 0735 07/31/20 1211  BP: (!) 115/59  Marland Kitchen)  99/55 100/75  Pulse: 74  72 76  Resp: _0 Temp: 98.3 F (36.8 C)  98.9 F (37.2 C) 99.1 F (37.3 C)  TempSrc:      SpO2: 100%  97% 97%  Weight:  62.4 kg    Height:        Intake/Output Summary (Last 24 hours) at 07/31/2020 1336 Last data filed at 07/31/2020 1216 Gross per 24 hour  Intake 480 ml  Output 3275 ml   Net -2795 ml   Filed Weights   07/27/20 0442 07/30/20 0500 07/31/20 0500  Weight: 82.7 kg 61 kg 62.4 kg    Examination:  General exam: No acute distress.  Appears chronically ill Respiratory system: Clear to auscultation. Respiratory effort normal. Cardiovascular system: S1-S2, regular rate and rhythm, no murmurs  gastrointestinal system: Obese, nontender, nondistended, normal bowel sounds, PEG tube removed.  Site CDI  Central nervous system: Alert and oriented. No focal neurological deficits. Extremities: Symmetric 5 x 5 power. Skin: No rashes, lesions or ulcers Psychiatry: Judgement and insight appear normal. Mood & affect appropriate.     Data Reviewed: I have personally reviewed following labs and imaging studies  CBC: Recent Labs  Lab 07/25/20 0554 07/26/20 0411 07/27/20 0529 07/28/20 0420 07/29/20 0657  WBC 6.2 7.0 7.9 6.9 8.8  HGB 8.1* 8.7* 8.0* 8.3* 8.0*  HCT 25.3* 26.1* 24.6* 25.8* 24.0*  MCV 94.4 92.2 93.5 92.8 94.1  PLT 300 324 305 311 683   Basic Metabolic Panel: Recent Labs  Lab 07/25/20 0554 07/26/20 0411 07/27/20 0529 07/28/20 0420 07/29/20 0657 07/30/20 0834  NA 133* 133* 135 136 133* 136  K 5.4* 4.3 4.1 4.1 5.1 4.8  CL 97* 99 100 101 101 100  CO2 _1 GLUCOSE 111* 115* 107* 104* 137* 92  BUN 53* 52* 49* 39* 58* 54*  CREATININE 2.89* 3.08* 3.02* 3.59* 3.28* 3.35*  CALCIUM 9.4 9.2 8.9 8.9 8.8* 8.7*  MG 2.6* 2.3 2.3  --   --   --   PHOS 3.1 4.1 4.1  --   --   --    GFR: Estimated Creatinine Clearance: 20.1 mL/min (A) (by C-G formula based on SCr of 3.35 mg/dL (H)). Liver Function Tests: No results for input(s): AST, ALT, ALKPHOS, BILITOT, PROT, ALBUMIN in the last 168 hours. No results for input(s): LIPASE, AMYLASE in the last 168 hours. No results for input(s): AMMONIA in the last 168 hours. Coagulation Profile: No results for input(s): INR, PROTIME in the last 168 hours. Cardiac Enzymes: No results for input(s): CKTOTAL,  CKMB, CKMBINDEX, TROPONINI in the last 168 hours. BNP (last 3 results) No results for input(s): PROBNP in the last 8760 hours. HbA1C: No results for input(s): HGBA1C in the last 72 hours. CBG: Recent Labs  Lab 07/30/20 1128 07/30/20 1659 07/30/20 2146 07/31/20 0736 07/31/20 1213  GLUCAP 145* 141* 142* 153* 159*   Lipid Profile: No results for input(s): CHOL, HDL, LDLCALC, TRIG, CHOLHDL, LDLDIRECT in the last 72 hours. Thyroid Function Tests: No results for input(s): TSH, T4TOTAL, FREET4, T3FREE, THYROIDAB in the last 72 hours. Anemia Panel: No results for input(s): VITAMINB12, FOLATE, FERRITIN, TIBC, IRON, RETICCTPCT in the last 72 hours. Sepsis Labs: No results for input(s): PROCALCITON, LATICACIDVEN in the last 168 hours.  No results found for this or any previous visit (from the past 240 hour(s)).       Radiology Studies: No results found.      Scheduled Meds:  amLODipine  10 mg Per Tube Daily   chlorhexidine gluconate (MEDLINE KIT)  15 mL Mouth Rinse BID   Chlorhexidine Gluconate Cloth  6 each Topical Daily   cloNIDine  0.3 mg Transdermal Weekly   feeding supplement (NEPRO CARB STEADY)  237 mL Oral BID BM   gabapentin  300 mg Oral QHS   And   gabapentin  100 mg Oral BID WC   heparin injection (subcutaneous)  5,000 Units Subcutaneous Q8H   insulin aspart  0-20 Units Subcutaneous TID AC & HS   insulin glargine  20 Units Subcutaneous Daily   metoprolol tartrate  100 mg Per Tube BID   morphine  15 mg Oral Q12H   multivitamin  1 tablet Per Tube QHS   nutrition supplement (JUVEN)  1 packet Per Tube BID   pantoprazole  40 mg Oral Daily   prazosin  4 mg Per Tube QHS   QUEtiapine  25 mg Per Tube BID   traZODone  150 mg Per Tube QHS   Continuous Infusions:  sodium chloride Stopped (06/22/20 1614)     LOS: 57 days    Time spent: 15 minutes    Sidney Ace, MD [facility] Pager [phone]  If 7PM-7AM, please contact  night-coverage 07/31/2020, 1:36 PM

## 2020-07-31 NOTE — Progress Notes (Signed)
Nutrition Follow-up  DOCUMENTATION CODES:  Non-severe (moderate) malnutrition in context of chronic illness  INTERVENTION:  Continue current diet as ordered, per SLP recommendations Nepro Shake po BID, each supplement provides 425 kcal and 19 grams protein. If pt doesn't consume, will order to bolus contianers Rena-vit daily  Juven Fruit Punch BID, each serving provides 95kcal and 2.5g of protein (amino acids glutamine and arginine)  NUTRITION DIAGNOSIS:  Moderate Malnutrition (in the context of chronic illness) related to inability to eat as evidenced by mild fat depletion, mild muscle depletion, moderate muscle depletion  - ongoing  GOAL:  Patient will meet greater than or equal to 90% of their needs  - progressing  MONITOR:  TF tolerance, Labs, Weight trends  ASSESSMENT:  60 y.o. male with h/o hypertension, PTSD, diabetes, migraines, GERD, neurogenic bladder secondary to remote history of transverse myelitis, requires self-catheterization and has frequent UTIs and opioids for chronic pain who is admitted on 06/04/2020 for CAP and sepsis   6/6 HD initiation 6/17 NGT replaced 6/24 NGT replaced again 6/27 - Nephrology signed off, no further HD 6/29 - PEG placed by GI 7/6 - SLP advanced to clear liquids 7/7 - SLP advanced to DYS 1 diet 7/18 - TF stopped by MD per pt request, DYS 3 diet initiated 7/27 - PEG pulled  Pt resting in bed at the time of visit. States he is happy to be on regular diet and to have PEG tube out. Pt goes through periods of irritability during conversation, but easy to redirect. Inquired about appetite, states he eats when he's hungry, not when people tell him to. Does endorse drinking the Nepro, likes the butter pecan flavor best.  Noted a significant weight change in the last few days (182.3 lb to 137 lb in 4 days). Likely inaccurate as visually, pt's weight looks to be closer to 180 lbs. Request RN obtain new measured weight.   Noted CM secured bed for  pt, likely will dc tomorrow.   Average Meal Intake: 7/13-7/20: 33% intake x 10 recorded meals (trending up) 7/21-7/27: 64% intake x 11 recorded meals (0-100%)  Nutritionally Relevant Medications: Scheduled Meds:  feeding supplement (NEPRO CARB STEADY)  237 mL Oral BID BM   insulin aspart  0-20 Units Subcutaneous TID AC & HS   insulin glargine  20 Units Subcutaneous Daily   multivitamin  1 tablet Per Tube QHS   nutrition supplement (JUVEN)  1 packet Per Tube BID   pantoprazole  40 mg Oral Daily   PRN Meds: alum & mag hydroxide-simeth, ondansetron  Labs Reviewed: BUN 54, creatinine 3.35 (7/26) SBG ranges from 87-153 mg/dL over the last 24 hours HgbA1c 7.4% (5/31)  Nutrition Focused Physical Exam Flowsheet Row Most Recent Value  Orbital Region Mild depletion  Upper Arm Region No depletion  Thoracic and Lumbar Region No depletion  Buccal Region Mild depletion  Temple Region Moderate depletion  Clavicle Bone Region Mild depletion  Clavicle and Acromion Bone Region Mild depletion  Scapular Bone Region Mild depletion  Dorsal Hand Mild depletion  Patellar Region Mild depletion  Anterior Thigh Region Mild depletion  Posterior Calf Region Moderate depletion  Edema (RD Assessment) None  Hair Reviewed  Eyes Reviewed  Mouth Reviewed  Skin Reviewed  Nails Reviewed   Diet Order:   Diet Order             Diet regular Room service appropriate? Yes; Fluid consistency: Thin  Diet effective now  EDUCATION NEEDS:  No education needs have been identified at this time  Skin:  Skin Assessment: Skin Integrity Issues: (sacral wound 1.8 cm x 0.8 cm) Skin Integrity Issues:: Stage II Stage II: coccyx  Last BM:  7/27 - type 5  Height:  Ht Readings from Last 1 Encounters:  07/27/20 5\' 5"  (1.651 m)    Weight:  Wt Readings from Last 1 Encounters:  07/31/20 62.4 kg   Ideal Body Weight:  64.5 kg  BMI:  Body mass index is 22.89 kg/m.  Estimated Nutritional  Needs:  Kcal:  2200-2400 kcal/d Protein:  110-125g/d Fluid:  2-2.2 L/d  08/02/20, RD, LDN Clinical Dietitian Pager on Amion

## 2020-07-31 NOTE — Progress Notes (Signed)
Occupational Therapy Treatment Patient Details Name: Wyatt Ball. MRN: [id number] DOB: 20-Jul-1960 Today's Date: 07/31/2020    History of present illness Wyatt Ball. is a 60 y.o. 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  Since hospitalization pt has been intubated, finally extubated 6/15.   OT comments  Pt seen for OT treatment on this date, with PT present at end of session to address functional mobility and ensure pt/therapist safety with OOB mobility. Upon arrival to room, pt in bed with hospital gown doffed and pt verbalizing frustrations. OT provided therapeutic listening and pt subsequently motivated to participate in OT tx. Pt was able to don socks at bed-level and perform supine>sit transfer, requiring MIN GUARD only this date. Following transfer, pt was observed to be incontinent of stool. Pt returned to supine and required MAX A for bed-level peri-care. Following peri-care, pt returned to sitting EOB, fatiguing quickly and requiring b/l UE to sit EOB. With MAX A+2, pt attempted x4 sit<>stand transfers with RW. Pt is making good progress toward goals and continues to benefit from skilled OT services to maximize return to PLOF and minimize risk of future falls, injury, caregiver burden, and readmission. Will continue to follow POC. Discharge recommendation remains appropriate.     Follow Up Recommendations  SNF    Equipment Recommendations  Other (comment) (TBD)       Precautions / Restrictions Precautions Precautions: Fall Restrictions Weight Bearing Restrictions: No       Mobility Bed Mobility Overal bed mobility: Needs  Assistance Bed Mobility: Supine to Sit;Sit to Supine;Rolling Rolling: Supervision   Supine to sit: Min guard Sit to supine: Min guard   General bed mobility comments: With use of bedrails and increased time/effortm, pt able to perform bed mobility with no physical assist    Transfers Overall transfer level: Needs assistance Equipment used: Rolling walker (2 wheeled) Transfers: Sit to/from Stand Sit to Stand: Max assist;+2 physical assistance         General transfer comment: x4 attempts. MAX verbal cues for safe hand position.    Balance Overall balance assessment: Needs assistance Sitting-balance support: No upper extremity supported;Feet supported Sitting balance-Leahy Scale: Good Sitting balance - Comments: Good initial static sitting balance at EOB, however fatigues quickly, requiring bilateral UE support overtime Postural control: Other (comment) (forward lean) Standing balance support: Bilateral upper extremity supported;During functional activity Standing balance-Leahy Scale: Zero Standing balance comment: requires BUE support + MAX A+2 to maintain standing position for ~10sec                           ADL either performed or assessed with clinical judgement   ADL Overall ADL's : Needs assistance/impaired Eating/Feeding: Supervision/ safety;Set up;Sitting Eating/Feeding Details (indicate cue type and reason): to bring cup to mouth and drink via straw             Upper Body Dressing : Minimal assistance;Sitting Upper Body Dressing Details (indicate cue type and reason): to don hospital gown Lower Body Dressing: Supervision/safety;Set up;Bed level Lower Body Dressing Details (indicate cue type and reason): To don/doff socks. Pt fatigues quickly, and required x2 rest breaks to don socks  Toileting- Clothing Manipulation and Hygiene: Maximal assistance;Bed level Toileting - Clothing Manipulation Details (indicate cue type and reason): MAX A for  posterior peri-care     Functional mobility during ADLs: Maximal assistance;+2 for physical assistance;Rolling walker        Cognition Arousal/Alertness: Awake/alert Behavior During Therapy: WFL for tasks assessed/performed Overall Cognitive Status: Impaired/Different from baseline Area of Impairment: Orientation;Following commands;Safety/judgement;Awareness;Problem solving;Memory                 Orientation Level: Disoriented to;Situation   Memory: Decreased short-term memory Following Commands: Follows one step commands with increased time;Follows multi-step commands inconsistently Safety/Judgement: Decreased awareness of safety;Decreased awareness of deficits Awareness: Intellectual Problem Solving: Slow processing;Requires verbal cues;Requires tactile cues General Comments: alert and agreeable to session, fatigues quickly                   Pertinent Vitals/ Pain       Pain Assessment: No/denies pain         Frequency  Min 1X/week        Progress Toward Goals  OT Goals(current goals can now be found in the care plan section)  Progress towards OT goals: Progressing toward goals  Acute Rehab OT Goals Patient Stated Goal: to walk out of here OT Goal Formulation: With patient Time For Goal Achievement: 08/12/20 Potential to Achieve Goals: Fair  Plan Frequency remains appropriate;Discharge plan remains appropriate    Co-evaluation    PT/OT/SLP Co-Evaluation/Treatment: Yes Reason for Co-Treatment: For patient/therapist safety;To address functional/ADL transfers PT goals addressed during session: Mobility/safety with mobility;Balance;Proper use of DME OT goals addressed during session: ADL's and self-care      AM-PAC OT "6 Clicks" Daily Activity     Outcome Measure   Help from another person eating meals?: A Little Help from another person taking care of personal grooming?: A Little Help from another person toileting, which includes using  toliet, bedpan, or urinal?: A Lot Help from another person bathing (including washing, rinsing, drying)?: A Lot Help from another person to put on and taking off regular upper body clothing?: A Little Help from another person to put on and taking off regular lower body clothing?: A Little 6 Click Score: 16    End of Session Equipment Utilized During Treatment: Gait belt;Rolling walker  OT Visit Diagnosis: Other symptoms and signs involving cognitive function   Activity Tolerance Patient tolerated treatment well   Patient Left in bed;with call bell/phone within reach;with bed alarm set   Nurse Communication Mobility status        Time: 3536-1443 OT Time Calculation (min): 56 min  Charges: OT General Charges $OT Visit: 1 Visit OT Treatments $Self Care/Home Management : 23-37 mins  Matthew Folks, OTR/L [facility] [phone]

## 2020-08-01 LAB — RESP PANEL BY RT-PCR (FLU A&B, COVID) ARPGX2
Influenza A by PCR: NEGATIVE
Influenza B by PCR: NEGATIVE
SARS Coronavirus 2 by RT PCR: NEGATIVE

## 2020-08-01 LAB — GLUCOSE, CAPILLARY
Glucose-Capillary: 134 mg/dL — ABNORMAL HIGH (ref 70–99)
Glucose-Capillary: 266 mg/dL — ABNORMAL HIGH (ref 70–99)
Glucose-Capillary: 67 mg/dL — ABNORMAL LOW (ref 70–99)
Glucose-Capillary: 96 mg/dL (ref 70–99)
Glucose-Capillary: 262 mg/dL — ABNORMAL HIGH (ref 70–99)

## 2020-08-01 MED ORDER — QUETIAPINE FUMARATE 25 MG PO TABS
25.0000 mg | ORAL_TABLET | Freq: Two times a day (BID) | ORAL | Status: DC
Start: 2020-08-01 — End: 2020-10-20

## 2020-08-01 MED ORDER — INSULIN GLARGINE 100 UNIT/ML ~~LOC~~ SOLN: 20.0000 [IU] | Freq: Every day | SUBCUTANEOUS | 11 refills | Status: DC

## 2020-08-01 MED ORDER — PANTOPRAZOLE SODIUM 40 MG PO TBEC: 40.0000 mg | DELAYED_RELEASE_TABLET | Freq: Every day | ORAL | Status: DC

## 2020-08-01 MED ORDER — METOPROLOL TARTRATE 100 MG PO TABS: 100.0000 mg | ORAL_TABLET | Freq: Two times a day (BID) | ORAL | Status: DC

## 2020-08-01 MED ORDER — GABAPENTIN 300 MG PO CAPS: 300.0000 mg | ORAL_CAPSULE | Freq: Every day | ORAL | Status: DC

## 2020-08-01 MED ORDER — MORPHINE SULFATE ER 15 MG PO TBCR: 15.0000 mg | EXTENDED_RELEASE_TABLET | Freq: Two times a day (BID) | ORAL | 0 refills | Status: AC

## 2020-08-01 MED ORDER — AMLODIPINE BESYLATE 10 MG PO TABS: 10.0000 mg | ORAL_TABLET | Freq: Every day | ORAL | Status: DC

## 2020-08-01 MED ORDER — CLONIDINE 0.3 MG/24HR TD PTWK: 0.3000 mg | MEDICATED_PATCH | TRANSDERMAL | 12 refills | Status: DC

## 2020-08-01 MED ORDER — OXYCODONE-ACETAMINOPHEN 5-325 MG PO TABS: 1.0000 | ORAL_TABLET | Freq: Two times a day (BID) | ORAL | 0 refills | Status: AC | PRN

## 2020-08-01 MED ORDER — GABAPENTIN 100 MG PO CAPS
100.0000 mg | ORAL_CAPSULE | Freq: Two times a day (BID) | ORAL | Status: DC
Start: 2020-08-01 — End: 2020-10-20

## 2020-08-01 NOTE — TOC Progression Note (Signed)
Transition of Care (TOC) - Progression Note    Patient Details  Name: Wyatt Ball. MRN: [id number] Date of Birth: [birth year]  Transition of Care [facility]) CM/SW Contact  Caryn Section, RN Phone Number: 08/01/2020, 10:54 AM  Clinical Narrative:   [facility] in [place], [place] Address: [address] [place], [place] [address] Phone: [phone] is able to accommodate patient as per Admissions Coordinator.    Laqueta Carina from [facility] in [place] [phone]  made arrangements for Transport from [facility] at 10am 08/02/2020.    Patient's brother aware that T[facility] and [facility] patient closer to [facility], but no [facility] facility accepted patient.  Brother aware of facility, transport day and time.  Patient also aware.  TOC contact information given to all above mentioned, TOC to follow to discharge.    Expected Discharge Plan: Skilled Nursing Facility Barriers to Discharge: Continued Medical Work up  Expected Discharge Plan and Services Expected Discharge Plan: Skilled Nursing Facility In-house Referral: NA   Post Acute Care Choice: Skilled Nursing Facility Living arrangements for the past 2 months: Single Family Home                                       Social Determinants of Health (SDOH) Interventions    Readmission Risk Interventions No flowsheet data found.

## 2020-08-01 NOTE — Progress Notes (Signed)
PROGRESS NOTE    Wyatt Ball.  KCL:[id number] DOB: 1960/02/04 DOA: 06/04/2020 PCP: System, Provider Not In    Brief Narrative:  HTN, type II DM, PTSD, migraine, neurogenic bladder, remote history of transverse myelitis, GERD.  Presents with complaints of confusion found to have septic shock, unknown organism with acute kidney injury, COPD exacerbation requiring intubation and ICU stay. 5/21 admitted for sepsis and pneumonia. 6/2 transferred to ICU, intubated underwent bronchoscopy 6/3 self extubated followed by reintubated 6/7 HD started 6/15 extubated 6/19 transferred to [facility]. Neurology, nephrology, urology and infectious disease consulted. 6/24 Recurrent Fevers, treated with short course of antibiotics. 6/26 Right PICC line placed. 6/27 HD stopped.  Goals of care discussed with palliative team 6/29 EGD and G Tube placed for persistent dysphagia.   Currently plan is arrange safe discharge to SNF.  Discharge delayed by lack of safe disposition plan.  Reengaged GI.  Removed PEG tube at bedside on 7/26  [facility] informed that patient has a bed offer at this [facility] and [place].  Patient will discharge there via nonemergent transport on 7/29.   Assessment & Plan:   Principal Problem:   CAP (community acquired pneumonia) Active Problems:   Acute metabolic encephalopathy   Severe sepsis with septic shock (HCC)   Neurogenic bladder   Chronic, continuous use of opioids   Chronic low back pain   Type 2 diabetes mellitus without complication (HCC)   Migraines   Recurrent UTI   History of colon polyps   Urinary retention   Altered mental status   Pressure injury of skin   AKI (acute kidney injury) ([place])   Malnutrition of moderate degree  Acute encephalopathy Secondary to anoxic brain injury Neurology consulted, suggested gradual improvement over time. Metabolic work-up so far negative. EEG is negative. He is alert and oriented and answering all questions  appropriately. No longer need for PEG tube Plan: PEG tube removed 7/26.  Diet as tolerated Mental status at baseline     Acute kidney injury associated with hypernatremia hyperkalemia and hyperphosphatemia Probably secondary to ATN with underlying diabetic nephropathy Patient was on dialysis temporarily and nephrology believed that patient is not a good candidate for outpatient HD. Patient and family does not want to pursue further hemodialysis if renal function worsens. Creatinine has hovered in the threes. Continues to make urine Will monitor renal function     Hyperkalemia secondary to renal failure. 7/21 K5.4, Lokelma 10 g p.o. x2 doses given 7/24 K 4.1 wnl now Plan: Potassium 4.8 on 7/26.  Kidney function stable. Monitor kidney function intermittently   Chronic pain syndrome Resume home medications       Type 2 diabetes mellitus with renal complications Continue with sliding scale insulin A1c 7.4 Insulin-dependent.   DVT prophylaxis: SQ heparin Code Status: DNR Family Communication: None today  disposition Plan:Status is: Inpatient  Remains inpatient appropriate because:Inpatient level of care appropriate due to severity of illness, unsafe discharge plan  Dispo: The patient is from: Home              Anticipated d/c is to: SNF              Patient currently is medically stable to d/c.   Difficult to place patient Yes  Patient remains medically stable for discharge.  Discharge delayed by lack of safe disposition plan.  Per TOC patient will be transported to skilled nursing facility in [place] on 7/29     Level of care: Med-Surg  Consultants:  None  Procedures: None  Antimicrobials:  None   Subjective: Seen and examined.  Sitting up in bed.  No visible distress.  Objective: Vitals:   07/31/20 2119 08/01/20 0026 08/01/20 0507 08/01/20 0735  BP: 104/65 106/70 112/69 123/61  Pulse: 78 81 78 78  Resp: _0 Temp: 98.8 F (37.1 C) 97.9 F  (36.6 C) 98 F (36.7 C) 98 F (36.7 C)  TempSrc:  Oral    SpO2: 99% 99% 91% 100%  Weight:      Height:        Intake/Output Summary (Last 24 hours) at 08/01/2020 1207 Last data filed at 08/01/2020 0513 Gross per 24 hour  Intake --  Output 2525 ml  Net -2525 ml   Filed Weights   07/27/20 0442 07/30/20 0500 07/31/20 0500  Weight: 82.7 kg 61 kg 62.4 kg    Examination:  General exam: No acute distress.  Appears chronically ill Respiratory system: Clear to auscultation. Respiratory effort normal. Cardiovascular system: S1-S2, regular rate and rhythm, no murmurs  gastrointestinal system: Obese, nontender, nondistended, normal bowel sounds, PEG tube removed.  Site CDI  Central nervous system: Alert and oriented. No focal neurological deficits. Extremities: Symmetric 5 x 5 power. Skin: No rashes, lesions or ulcers Psychiatry: Judgement and insight appear normal. Mood & affect appropriate.     Data Reviewed: I have personally reviewed following labs and imaging studies  CBC: Recent Labs  Lab 07/26/20 0411 07/27/20 0529 07/28/20 0420 07/29/20 0657  WBC 7.0 7.9 6.9 8.8  HGB 8.7* 8.0* 8.3* 8.0*  HCT 26.1* 24.6* 25.8* 24.0*  MCV 92.2 93.5 92.8 94.1  PLT 324 305 311 427   Basic Metabolic Panel: Recent Labs  Lab 07/26/20 0411 07/27/20 0529 07/28/20 0420 07/29/20 0657 07/30/20 0834  NA 133* 135 136 133* 136  K 4.3 4.1 4.1 5.1 4.8  CL 99 100 101 101 100  CO2 _1 GLUCOSE 115* 107* 104* 137* 92  BUN 52* 49* 39* 58* 54*  CREATININE 3.08* 3.02* 3.59* 3.28* 3.35*  CALCIUM 9.2 8.9 8.9 8.8* 8.7*  MG 2.3 2.3  --   --   --   PHOS 4.1 4.1  --   --   --    GFR: Estimated Creatinine Clearance: 20.1 mL/min (A) (by C-G formula based on SCr of 3.35 mg/dL (H)). Liver Function Tests: No results for input(s): AST, ALT, ALKPHOS, BILITOT, PROT, ALBUMIN in the last 168 hours. No results for input(s): LIPASE, AMYLASE in the last 168 hours. No results for input(s): AMMONIA  in the last 168 hours. Coagulation Profile: No results for input(s): INR, PROTIME in the last 168 hours. Cardiac Enzymes: No results for input(s): CKTOTAL, CKMB, CKMBINDEX, TROPONINI in the last 168 hours. BNP (last 3 results) No results for input(s): PROBNP in the last 8760 hours. HbA1C: No results for input(s): HGBA1C in the last 72 hours. CBG: Recent Labs  Lab 07/31/20 0736 07/31/20 1213 07/31/20 1644 07/31/20 2120 08/01/20 0918  GLUCAP 153* 159* 193* 152* 134*   Lipid Profile: No results for input(s): CHOL, HDL, LDLCALC, TRIG, CHOLHDL, LDLDIRECT in the last 72 hours. Thyroid Function Tests: No results for input(s): TSH, T4TOTAL, FREET4, T3FREE, THYROIDAB in the last 72 hours. Anemia Panel: No results for input(s): VITAMINB12, FOLATE, FERRITIN, TIBC, IRON, RETICCTPCT in the last 72 hours. Sepsis Labs: No results for input(s): PROCALCITON, LATICACIDVEN in the last 168 hours.  No results found for this or any previous visit (from the past 240  hour(s)).       Radiology Studies: No results found.      Scheduled Meds:  amLODipine  10 mg Per Tube Daily   chlorhexidine gluconate (MEDLINE KIT)  15 mL Mouth Rinse BID   Chlorhexidine Gluconate Cloth  6 each Topical Daily   cloNIDine  0.3 mg Transdermal Weekly   feeding supplement (NEPRO CARB STEADY)  237 mL Oral BID BM   gabapentin  300 mg Oral QHS   And   gabapentin  100 mg Oral BID WC   heparin injection (subcutaneous)  5,000 Units Subcutaneous Q8H   insulin aspart  0-20 Units Subcutaneous TID AC & HS   insulin glargine  20 Units Subcutaneous Daily   metoprolol tartrate  100 mg Per Tube BID   morphine  15 mg Oral Q12H   multivitamin  1 tablet Per Tube QHS   nutrition supplement (JUVEN)  1 packet Per Tube BID   pantoprazole  40 mg Oral Daily   prazosin  4 mg Per Tube QHS   QUEtiapine  25 mg Per Tube BID   traZODone  150 mg Per Tube QHS   Continuous Infusions:  sodium chloride Stopped (06/22/20 1614)      LOS: 58 days    Time spent: 15 minutes    Sidney Ace, MD [facility] Pager [phone]  If 7PM-7AM, please contact night-coverage 08/01/2020, 12:07 PM

## 2020-08-02 LAB — GLUCOSE, CAPILLARY: Glucose-Capillary: 146 mg/dL — ABNORMAL HIGH (ref 70–99)

## 2020-08-02 NOTE — Progress Notes (Signed)
Called report to [facility] in [place]. Gave report to [place]. Gathered all items of patient, including clothes that Michelle Nasuti gave to patient. No belongings left in room.

## 2020-08-02 NOTE — Discharge Summary (Signed)
Physician Discharge Summary  Toniann KetAlfred Dileo Jr. VWU:[id number] DOB: 08/24/1960 DOA: 06/04/2020  PCP: System, Provider Not In  Admit date: 06/04/2020 Discharge date: 08/02/2020  Admitted From: Home Disposition: SNF  Recommendations for Outpatient Follow-up:  Follow up with PCP in 1-2 weeks   Home Health: No Equipment/Devices: None  Discharge Condition: Stable CODE STATUS: DNR Diet recommendation: Regular  Brief/Interim Summary: HTN, type II DM, PTSD, migraine, neurogenic bladder, remote history of transverse myelitis, GERD.  Presents with complaints of confusion found to have septic shock, unknown organism with acute kidney injury, COPD exacerbation requiring intubation and ICU stay. 5/21 admitted for sepsis and pneumonia. 6/2 transferred to ICU, intubated underwent bronchoscopy 6/3 self extubated followed by reintubated 6/7 HD started 6/15 extubated [facility] Neurology, nephrology, urology and infectious disease consulted. 6/24 Recurrent Fevers, treated with short course of antibiotics. 6/26 Right PICC line placed. 6/27 HD stopped.  Goals of care discussed with palliative team 6/29 EGD and G Tube placed for persistent dysphagia.   Currently plan is arrange safe discharge to SNF.  Discharge delayed by lack of safe disposition plan.  Reengaged GI.  Removed PEG tube at bedside on 7/26   [facility]  Patient will discharge there via nonemergent transport on 7/29.   Discharge Diagnoses:  Principal Problem:   CAP (community acquired pneumonia) Active Problems:   Acute metabolic encephalopathy   Severe sepsis with septic shock (HCC)   Neurogenic bladder   Chronic, continuous use of opioids   Chronic low back pain   Type 2 diabetes mellitus without complication (HCC)   Migraines   Recurrent UTI   History of colon polyps   Urinary retention   Altered mental status   Pressure injury of  skin   AKI (acute kidney injury) (HCC)   Malnutrition of moderate degree Acute encephalopathy Secondary to anoxic brain injury Neurology consulted, suggested gradual improvement over time. Metabolic work-up so far negative. EEG is negative. He is alert and oriented and answering all questions appropriately. No longer need for PEG tube Plan: PEG tube removed 7/26.  Diet as tolerated Mental status at baseline     Acute kidney injury associated with hypernatremia hyperkalemia and hyperphosphatemia Probably secondary to ATN with underlying diabetic nephropathy Patient was on dialysis temporarily and nephrology believed that patient is not a good candidate for outpatient HD. Patient and family does not want to pursue further hemodialysis if renal function worsens. Creatinine has hovered in the threes. Continues to make urine Will monitor renal function intermittently post discharge     Hyperkalemia secondary to renal failure. 7/21 K5.4, Lokelma 10 g p.o. x2 doses given 7/24 K 4.1 wnl now Plan: Potassium 4.8 on 7/26.  Kidney function stable. Monitor kidney function intermittently   Chronic pain syndrome Resume home medications       Type 2 diabetes mellitus with renal complications Continue with sliding scale insulin A1c 7.4 Insulin-dependent.   Discharge Instructions  Discharge Instructions     Diet - low sodium heart healthy   Complete by: As directed    Discharge wound care:   Complete by: As directed    Wound care  Every shift      Comments: Place a very small (2 x 2 gauze) moistened with saline, and folded to fit the wound at the coccyx. Cover with a foam dressing. Change each shift and prn soilage.   Increase activity slowly   Complete by: As  directed       Allergies as of 08/02/2020   No Known Allergies      Medication List     STOP taking these medications    Alogliptin Benzoate 12.5 MG Tabs   DULoxetine 60 MG capsule Commonly known as:  CYMBALTA   gabapentin 600 MG tablet Commonly known as: NEURONTIN Replaced by: gabapentin 300 MG capsule   guaiFENesin 600 MG 12 hr tablet Commonly known as: MUCINEX   ibuprofen 800 MG tablet Commonly known as: ADVIL   lisinopril 40 MG tablet Commonly known as: ZESTRIL   metFORMIN 1000 MG tablet Commonly known as: GLUCOPHAGE   metoprolol 200 MG 24 hr tablet Commonly known as: TOPROL-XL   omeprazole 20 MG capsule Commonly known as: PRILOSEC   sildenafil 100 MG tablet Commonly known as: VIAGRA       TAKE these medications    amLODipine 10 MG tablet Commonly known as: NORVASC Take 1 tablet (10 mg total) by mouth daily.   aspirin 81 MG chewable tablet Chew 81 mg by mouth daily.   cloNIDine 0.3 mg/24hr patch Commonly known as: CATAPRES - Dosed in mg/24 hr Place 1 patch (0.3 mg total) onto the skin once a week.   cyclobenzaprine 10 MG tablet Commonly known as: FLEXERIL Take 10 mg by mouth 3 (three) times daily as needed for muscle spasms.   DSS 100 MG Caps Take 200 mg by mouth 2 (two) times daily.   gabapentin 300 MG capsule Commonly known as: NEURONTIN Take 1 capsule (300 mg total) by mouth at bedtime. Replaces: gabapentin 600 MG tablet   gabapentin 100 MG capsule Commonly known as: NEURONTIN Take 1 capsule (100 mg total) by mouth 2 (two) times daily with breakfast and lunch.   hydrOXYzine 50 MG tablet Commonly known as: ATARAX/VISTARIL Take 50 mg by mouth every 6 (six) hours as needed for anxiety.   insulin glargine 100 UNIT/ML injection Commonly known as: LANTUS Inject 0.2 mLs (20 Units total) into the skin daily.   latanoprost 0.005 % ophthalmic solution Commonly known as: XALATAN Place 1 drop into both eyes at bedtime.   loratadine 10 MG tablet Commonly known as: CLARITIN Take 10 mg by mouth daily.   metoprolol tartrate 100 MG tablet Commonly known as: LOPRESSOR Take 1 tablet (100 mg total) by mouth 2 (two) times daily.   morphine 15 MG 12  hr tablet Commonly known as: MS CONTIN Take 1 tablet (15 mg total) by mouth 2 (two) times daily for 3 days.   oxyCODONE-acetaminophen 5-325 MG tablet Commonly known as: PERCOCET/ROXICET Take 1 tablet by mouth 2 (two) times daily as needed for up to 3 days. SNF use only.  Continuing meds deferred to SNF staff What changed:  reasons to take this additional instructions   pantoprazole 40 MG tablet Commonly known as: PROTONIX Take 1 tablet (40 mg total) by mouth daily.   prazosin 2 MG capsule Commonly known as: MINIPRESS Take 4 mg by mouth at bedtime.   prazosin 1 MG capsule Commonly known as: MINIPRESS Take 1 mg by mouth 2 (two) times daily as needed (PTSD symptoms).   promethazine 25 MG tablet Commonly known as: PHENERGAN Take 25 mg by mouth 3 (three) times daily as needed for nausea.   QUEtiapine 25 MG tablet Commonly known as: SEROQUEL Take 1 tablet (25 mg total) by mouth 2 (two) times daily.   simvastatin 80 MG tablet Commonly known as: ZOCOR Take 80 mg by mouth every evening.   traZODone 150  MG tablet Commonly known as: DESYREL Take 150 mg by mouth at bedtime.               Discharge Care Instructions  (From admission, onward)           Start     Ordered   08/02/20 0000  Discharge wound care:       Comments: Wound care  Every shift      Comments: Place a very small (2 x 2 gauze) moistened with saline, and folded to fit the wound at the coccyx. Cover with a foam dressing. Change each shift and prn soilage.   08/02/20 0730            No Known Allergies  Consultations: GI Palliative care Nephrology ICU   Procedures/Studies: DG Abd 1 View  Result Date: 07/13/2020 CLINICAL DATA:  Altered mental status.  Nausea EXAM: ABDOMEN - 1 VIEW COMPARISON:  None. FINDINGS: Gastrostomy tube projects over the stomach. Nonobstructive bowel gas pattern. No organomegaly or free air. IMPRESSION: No acute findings. Electronically Signed   By: Charlett Nose M.D.    On: 07/13/2020 12:22   (Echo, Carotid, EGD, Colonoscopy, ERCP)    Subjective: Seen and examined on the day of discharge.  Stable no distress.  Stable for discharge to skilled nursing facility.  Discharge Exam: Vitals:   08/01/20 1956 08/02/20 0405  BP: (!) 115/56 (!) 182/171  Pulse: 85 77  Resp: 18 18  Temp: 98.7 F (37.1 C) 98.4 F (36.9 C)  SpO2: 98% 98%   Vitals:   08/01/20 1222 08/01/20 1500 08/01/20 1956 08/02/20 0405  BP: 120/69 117/67 (!) 115/56 (!) 182/171  Pulse: 77 80 85 77  Resp: 18 18 18 18   Temp: 98 F (36.7 C) 98.1 F (36.7 C) 98.7 F (37.1 C) 98.4 F (36.9 C)  TempSrc:      SpO2: 99% 100% 98% 98%  Weight:      Height:        General: Pt is alert, awake, not in acute distress Cardiovascular: RRR, S1/S2 +, no rubs, no gallops Respiratory: CTA bilaterally, no wheezing, no rhonchi Abdominal: Soft, NT, ND, bowel sounds + Extremities: no edema, no cyanosis    The results of significant diagnostics from this hospitalization (including imaging, microbiology, ancillary and laboratory) are listed below for reference.     Microbiology: Recent Results (from the past 240 hour(s))  Resp Panel by RT-PCR (Flu A&B, Covid) Nasopharyngeal Swab     Status: None   Collection Time: 08/01/20  2:29 PM   Specimen: Nasopharyngeal Swab; Nasopharyngeal(NP) swabs in vial transport medium  Result Value Ref Range Status   SARS Coronavirus 2 by RT PCR NEGATIVE NEGATIVE Final    Comment: (NOTE) SARS-CoV-2 target nucleic acids are NOT DETECTED.  The SARS-CoV-2 RNA is generally detectable in upper respiratory specimens during the acute phase of infection. The lowest concentration of SARS-CoV-2 viral copies this assay can detect is 138 copies/mL. A negative result does not preclude SARS-Cov-2 infection and should not be used as the sole basis for treatment or other patient management decisions. A negative result may occur with  improper specimen collection/handling, submission  of specimen other than nasopharyngeal swab, presence of viral mutation(s) within the areas targeted by this assay, and inadequate number of viral copies(<138 copies/mL). A negative result must be combined with clinical observations, patient history, and epidemiological information. The expected result is Negative.  Fact Sheet for Patients:  08/03/20  Fact Sheet for Healthcare Providers:  BloggerCourse.com  This test is no t yet approved or cleared by the [place] and  has been authorized for detection and/or diagnosis of SARS-CoV-2 by FDA under an Emergency Use Authorization (EUA). This EUA will remain  in effect (meaning this test can be used) for the duration of the COVID-19 declaration under Section 564(b)(1) of the Act, 21 U.S.C.section 360bbb-3(b)(1), unless the authorization is terminated  or revoked sooner.       Influenza A by PCR NEGATIVE NEGATIVE Final   Influenza B by PCR NEGATIVE NEGATIVE Final    Comment: (NOTE) The Xpert Xpress SARS-CoV-2/FLU/RSV plus assay is intended as an aid in the diagnosis of influenza from Nasopharyngeal swab specimens and should not be used as a sole basis for treatment. Nasal washings and aspirates are unacceptable for Xpert Xpress SARS-CoV-2/FLU/RSV testing.  Fact Sheet for Patients: BloggerCourse.com  Fact Sheet for Healthcare Providers: SeriousBroker.it  This test is not yet approved or cleared by the [place] FDA and has been authorized for detection and/or diagnosis of SARS-CoV-2 by FDA under an Emergency Use Authorization (EUA). This EUA will remain in effect (meaning this test can be used) for the duration of the COVID-19 declaration under Section 564(b)(1) of the Act, 21 U.S.C. section 360bbb-3(b)(1), unless the authorization is terminated or revoked.  Performed at [facility], [address] [place], [place] [address]      Labs: BNP (last 3 results) No results for input(s): BNP in the last 8760 hours. Basic Metabolic Panel: Recent Labs  Lab 07/27/20 0529 07/28/20 0420 07/29/20 0657 07/30/20 0834  NA 135 136 133* 136  K 4.1 4.1 5.1 4.8  CL 100 101 101 100  CO2 GLUCOSE 107* 104* 137* 92  BUN 49* 39* 58* 54*  CREATININE 3.02* 3.59* 3.28* 3.35*  CALCIUM 8.9 8.9 8.8* 8.7*  MG 2.3  --   --   --   PHOS 4.1  --   --   --    Liver Function Tests: No results for input(s): AST, ALT, ALKPHOS, BILITOT, PROT, ALBUMIN in the last 168 hours. No results for input(s): LIPASE, AMYLASE in the last 168 hours. No results for input(s): AMMONIA in the last 168 hours. CBC: Recent Labs  Lab 07/27/20 0529 07/28/20 0420 07/29/20 0657  WBC 7.9 6.9 8.8  HGB 8.0* 8.3* 8.0*  HCT 24.6* 25.8* 24.0*  MCV 93.5 92.8 94.1  PLT 305 311 280   Cardiac Enzymes: No results for input(s): CKTOTAL, CKMB, CKMBINDEX, TROPONINI in the last 168 hours. BNP: Invalid input(s): POCBNP CBG: Recent Labs  Lab 08/01/20 0918 08/01/20 1222 08/01/20 1610 08/01/20 1706 08/01/20 2001  GLUCAP 134* 266* 67* 96 262*   D-Dimer No results for input(s): DDIMER in the last 72 hours. Hgb A1c No results for input(s): HGBA1C in the last 72 hours. Lipid Profile No results for input(s): CHOL, HDL, LDLCALC, TRIG, CHOLHDL, LDLDIRECT in the last 72 hours. Thyroid function studies No results for input(s): TSH, T4TOTAL, T3FREE, THYROIDAB in the last 72 hours.  Invalid input(s): FREET3 Anemia work up No results for input(s): VITAMINB12, FOLATE, FERRITIN, TIBC, IRON, RETICCTPCT in the last 72 hours. Urinalysis    Component Value Date/Time   COLORURINE YELLOW (A) 06/28/2020 1613   APPEARANCEUR CLOUDY (A) 06/28/2020 1613   LABSPEC 1.014 06/28/2020 1613   PHURINE 5.0 06/28/2020 1613   GLUCOSEU 50 (A) 06/28/2020 1613   HGBUR MODERATE (A) 06/28/2020 1613   BILIRUBINUR NEGATIVE 06/28/2020 1613    KETONESUR NEGATIVE  06/28/2020 1613   PROTEINUR 30 (A) 06/28/2020 1613   NITRITE NEGATIVE 06/28/2020 1613   LEUKOCYTESUR LARGE (A) 06/28/2020 1613   Sepsis Labs Invalid input(s): PROCALCITONIN,  WBC,  LACTICIDVEN Microbiology Recent Results (from the past 240 hour(s))  Resp Panel by RT-PCR (Flu A&B, Covid) Nasopharyngeal Swab     Status: None   Collection Time: 08/01/20  2:29 PM   Specimen: Nasopharyngeal Swab; Nasopharyngeal(NP) swabs in vial transport medium  Result Value Ref Range Status   SARS Coronavirus 2 by RT PCR NEGATIVE NEGATIVE Final    Comment: (NOTE) SARS-CoV-2 target nucleic acids are NOT DETECTED.  The SARS-CoV-2 RNA is generally detectable in upper respiratory specimens during the acute phase of infection. The lowest concentration of SARS-CoV-2 viral copies this assay can detect is 138 copies/mL. A negative result does not preclude SARS-Cov-2 infection and should not be used as the sole basis for treatment or other patient management decisions. A negative result may occur with  improper specimen collection/handling, submission of specimen other than nasopharyngeal swab, presence of viral mutation(s) within the areas targeted by this assay, and inadequate number of viral copies(<138 copies/mL). A negative result must be combined with clinical observations, patient history, and epidemiological information. The expected result is Negative.  Fact Sheet for Patients:  BloggerCourse.com  Fact Sheet for Healthcare Providers:  SeriousBroker.it  This test is no t yet approved or cleared by the [place] FDA and  has been authorized for detection and/or diagnosis of SARS-CoV-2 by FDA under an Emergency Use Authorization (EUA). This EUA will remain  in effect (meaning this test can be used) for the duration of the COVID-19 declaration under Section 564(b)(1) of the Act, 21 U.S.C.section 360bbb-3(b)(1), unless the  authorization is terminated  or revoked sooner.       Influenza A by PCR NEGATIVE NEGATIVE Final   Influenza B by PCR NEGATIVE NEGATIVE Final    Comment: (NOTE) The Xpert Xpress SARS-CoV-2/FLU/RSV plus assay is intended as an aid in the diagnosis of influenza from Nasopharyngeal swab specimens and should not be used as a sole basis for treatment. Nasal washings and aspirates are unacceptable for Xpert Xpress SARS-CoV-2/FLU/RSV testing.  Fact Sheet for Patients: BloggerCourse.com  Fact Sheet for Healthcare Providers: SeriousBroker.it  This test is not yet approved or cleared by the [place] FDA and has been authorized for detection and/or diagnosis of SARS-CoV-2 by FDA under an Emergency Use Authorization (EUA). This EUA will remain in effect (meaning this test can be used) for the duration of the COVID-19 declaration under Section 564(b)(1) of the Act, 21 U.S.C. section 360bbb-3(b)(1), unless the authorization is terminated or revoked.  Performed at [facility], [address], [place], [place] [address]      Time coordinating discharge: Over 30 minutes  SIGNED:   Tresa Moore, MD  [facility] 08/02/2020, 7:30 AM Pager   If 7PM-7AM, please contact night-coverage

## 2020-08-02 NOTE — TOC Progression Note (Signed)
Transition of Care (TOC) - Progression Note    Patient Details  Name: Wyatt Ball. MRN: [id number] Date of Birth: February 06, 1960  Transition of Care [facility]) CM/SW Contact  Caryn Section, RN Phone Number: 08/02/2020, 10:19 AM  Clinical Narrative:   Patient going to [facility] in [place], [place] today,  Patient and brother are aware and are amenable to plan.  [facility] will pick patient up to transport to facility at 10 am    Expected Discharge Plan: Skilled Nursing Facility Barriers to Discharge: Barriers Resolved  Expected Discharge Plan and Services Expected Discharge Plan: Skilled Nursing Facility In-house Referral: NA   Post Acute Care Choice: Skilled Nursing Facility Living arrangements for the past 2 months: Single Family Home Expected Discharge Date: 08/02/20               DME Arranged:  (n/a)         HH Arranged:  (n/a)           Social Determinants of Health (SDOH) Interventions    Readmission Risk Interventions Readmission Risk Prevention Plan 08/01/2020  Transportation Screening Complete  Medication Review [facility]) Complete  PCP or Specialist appointment within 3-5 days of discharge Complete  HRI or Home Care Consult Complete  SW Recovery Care/Counseling Consult Not Complete  SW Consult Not Complete Comments Patient assigned to [facility]  Palliative Care Screening Complete  Skilled Nursing Facility Complete  Some recent data might be hidden

## 2020-08-02 NOTE — Plan of Care (Signed)
  Problem: Coping: Goal: Level of anxiety will decrease Outcome: Progressing  Patinet anxiety decreased with redirection, reassurance, effective communication,

## 2020-10-14 ENCOUNTER — Inpatient Hospital Stay
Admission: EM | Admit: 2020-10-14 | Discharge: 2020-10-20 | DRG: 070 | Disposition: A | Discharge status: Home Health | Payer: No Typology Code available for payment source | Attending: Internal Medicine | Admitting: Internal Medicine

## 2020-10-14 ENCOUNTER — Inpatient Hospital Stay: Payer: No Typology Code available for payment source

## 2020-10-14 ENCOUNTER — Inpatient Hospital Stay
Admit: 2020-10-14 | Discharge: 2020-10-14 | Disposition: A | Discharge status: Home / Self Care | Payer: No Typology Code available for payment source | Attending: Family Medicine | Admitting: Family Medicine

## 2020-10-14 ENCOUNTER — Encounter: Payer: Self-pay | Admitting: Radiology

## 2020-10-14 ENCOUNTER — Emergency Department: Payer: No Typology Code available for payment source

## 2020-10-14 DIAGNOSIS — T17800A Unspecified foreign body in other parts of respiratory tract causing asphyxiation, initial encounter: Secondary | ICD-10-CM

## 2020-10-14 DIAGNOSIS — R5383 Other fatigue: Secondary | ICD-10-CM | POA: Diagnosis not present

## 2020-10-14 DIAGNOSIS — I674 Hypertensive encephalopathy: Secondary | ICD-10-CM | POA: Diagnosis present

## 2020-10-14 DIAGNOSIS — Z7982 Long term (current) use of aspirin: Secondary | ICD-10-CM

## 2020-10-14 DIAGNOSIS — K219 Gastro-esophageal reflux disease without esophagitis: Secondary | ICD-10-CM | POA: Diagnosis present

## 2020-10-14 DIAGNOSIS — G629 Polyneuropathy, unspecified: Secondary | ICD-10-CM

## 2020-10-14 DIAGNOSIS — E785 Hyperlipidemia, unspecified: Secondary | ICD-10-CM | POA: Diagnosis present

## 2020-10-14 DIAGNOSIS — E1122 Type 2 diabetes mellitus with diabetic chronic kidney disease: Secondary | ICD-10-CM | POA: Diagnosis present

## 2020-10-14 DIAGNOSIS — F4310 Post-traumatic stress disorder, unspecified: Secondary | ICD-10-CM | POA: Diagnosis present

## 2020-10-14 DIAGNOSIS — E119 Type 2 diabetes mellitus without complications: Secondary | ICD-10-CM | POA: Diagnosis not present

## 2020-10-14 DIAGNOSIS — Z794 Long term (current) use of insulin: Secondary | ICD-10-CM | POA: Diagnosis not present

## 2020-10-14 DIAGNOSIS — G40909 Epilepsy, unspecified, not intractable, without status epilepticus: Secondary | ICD-10-CM | POA: Diagnosis present

## 2020-10-14 DIAGNOSIS — J690 Pneumonitis due to inhalation of food and vomit: Secondary | ICD-10-CM | POA: Diagnosis present

## 2020-10-14 DIAGNOSIS — Z8249 Family history of ischemic heart disease and other diseases of the circulatory system: Secondary | ICD-10-CM

## 2020-10-14 DIAGNOSIS — G8929 Other chronic pain: Secondary | ICD-10-CM | POA: Diagnosis present

## 2020-10-14 DIAGNOSIS — N179 Acute kidney failure, unspecified: Secondary | ICD-10-CM

## 2020-10-14 DIAGNOSIS — G373 Acute transverse myelitis in demyelinating disease of central nervous system: Secondary | ICD-10-CM | POA: Diagnosis present

## 2020-10-14 DIAGNOSIS — E1142 Type 2 diabetes mellitus with diabetic polyneuropathy: Secondary | ICD-10-CM | POA: Diagnosis present

## 2020-10-14 DIAGNOSIS — T17800S Unspecified foreign body in other parts of respiratory tract causing asphyxiation, sequela: Secondary | ICD-10-CM | POA: Diagnosis not present

## 2020-10-14 DIAGNOSIS — D631 Anemia in chronic kidney disease: Secondary | ICD-10-CM | POA: Diagnosis present

## 2020-10-14 DIAGNOSIS — Z66 Do not resuscitate: Secondary | ICD-10-CM | POA: Diagnosis present

## 2020-10-14 DIAGNOSIS — R339 Retention of urine, unspecified: Secondary | ICD-10-CM | POA: Diagnosis not present

## 2020-10-14 DIAGNOSIS — F32A Depression, unspecified: Secondary | ICD-10-CM | POA: Diagnosis present

## 2020-10-14 DIAGNOSIS — Z79899 Other long term (current) drug therapy: Secondary | ICD-10-CM

## 2020-10-14 DIAGNOSIS — R710 Precipitous drop in hematocrit: Secondary | ICD-10-CM | POA: Diagnosis not present

## 2020-10-14 DIAGNOSIS — R338 Other retention of urine: Secondary | ICD-10-CM | POA: Diagnosis not present

## 2020-10-14 DIAGNOSIS — N1832 Chronic kidney disease, stage 3b: Secondary | ICD-10-CM | POA: Diagnosis present

## 2020-10-14 DIAGNOSIS — I160 Hypertensive urgency: Secondary | ICD-10-CM | POA: Diagnosis present

## 2020-10-14 DIAGNOSIS — I1310 Hypertensive heart and chronic kidney disease without heart failure, with stage 1 through stage 4 chronic kidney disease, or unspecified chronic kidney disease: Secondary | ICD-10-CM | POA: Diagnosis present

## 2020-10-14 DIAGNOSIS — N189 Chronic kidney disease, unspecified: Secondary | ICD-10-CM | POA: Diagnosis not present

## 2020-10-14 DIAGNOSIS — Z833 Family history of diabetes mellitus: Secondary | ICD-10-CM

## 2020-10-14 DIAGNOSIS — I6783 Posterior reversible encephalopathy syndrome: Principal | ICD-10-CM

## 2020-10-14 DIAGNOSIS — F17210 Nicotine dependence, cigarettes, uncomplicated: Secondary | ICD-10-CM | POA: Diagnosis present

## 2020-10-14 DIAGNOSIS — N1831 Chronic kidney disease, stage 3a: Secondary | ICD-10-CM | POA: Diagnosis not present

## 2020-10-14 DIAGNOSIS — F419 Anxiety disorder, unspecified: Secondary | ICD-10-CM | POA: Diagnosis present

## 2020-10-14 DIAGNOSIS — Z20822 Contact with and (suspected) exposure to covid-19: Secondary | ICD-10-CM | POA: Diagnosis present

## 2020-10-14 DIAGNOSIS — Z96649 Presence of unspecified artificial hip joint: Secondary | ICD-10-CM | POA: Diagnosis present

## 2020-10-14 DIAGNOSIS — I639 Cerebral infarction, unspecified: Secondary | ICD-10-CM | POA: Diagnosis present

## 2020-10-14 DIAGNOSIS — E1169 Type 2 diabetes mellitus with other specified complication: Secondary | ICD-10-CM | POA: Diagnosis present

## 2020-10-14 DIAGNOSIS — R569 Unspecified convulsions: Secondary | ICD-10-CM

## 2020-10-14 LAB — CBC
HCT: 36.3 % — ABNORMAL LOW (ref 39.0–52.0)
Hemoglobin: 12.1 g/dL — ABNORMAL LOW (ref 13.0–17.0)
MCH: 30.9 pg (ref 26.0–34.0)
MCHC: 33.3 g/dL (ref 30.0–36.0)
MCV: 92.6 fL (ref 80.0–100.0)
Platelets: 282 10*3/uL (ref 150–400)
RBC: 3.92 MIL/uL — ABNORMAL LOW (ref 4.22–5.81)
RDW: 12.1 % (ref 11.5–15.5)
WBC: 7.5 10*3/uL (ref 4.0–10.5)
nRBC: 0 % (ref 0.0–0.2)

## 2020-10-14 LAB — LIPID PANEL
Cholesterol: 125 mg/dL (ref 0–200)
HDL: 33 mg/dL — ABNORMAL LOW (ref >40)
LDL Cholesterol: 67 mg/dL (ref 0–99)
Total CHOL/HDL Ratio: 3.8 RATIO
Triglycerides: 124 mg/dL (ref <150)
VLDL: 25 mg/dL (ref 0–40)

## 2020-10-14 LAB — COMPREHENSIVE METABOLIC PANEL
ALT: 10 U/L (ref 0–44)
AST: 14 U/L — ABNORMAL LOW (ref 15–41)
Albumin: 3.8 g/dL (ref 3.5–5.0)
Alkaline Phosphatase: 82 U/L (ref 38–126)
Anion gap: 10 (ref 5–15)
BUN: 21 mg/dL (ref 8–23)
CO2: 25 mmol/L (ref 22–32)
Calcium: 9.6 mg/dL (ref 8.9–10.3)
Chloride: 101 mmol/L (ref 98–111)
Creatinine, Ser: 1.54 mg/dL — ABNORMAL HIGH (ref 0.61–1.24)
GFR, Estimated: 51 mL/min — ABNORMAL LOW (ref >60)
Glucose, Bld: 165 mg/dL — ABNORMAL HIGH (ref 70–99)
Potassium: 4.4 mmol/L (ref 3.5–5.1)
Sodium: 136 mmol/L (ref 135–145)
Total Bilirubin: 0.6 mg/dL (ref 0.3–1.2)
Total Protein: 7 g/dL (ref 6.5–8.1)

## 2020-10-14 LAB — RESP PANEL BY RT-PCR (FLU A&B, COVID) ARPGX2
Influenza A by PCR: NEGATIVE
Influenza B by PCR: NEGATIVE
SARS Coronavirus 2 by RT PCR: NEGATIVE

## 2020-10-14 LAB — MRSA NEXT GEN BY PCR, NASAL: MRSA by PCR Next Gen: NOT DETECTED

## 2020-10-14 LAB — CBG MONITORING, ED
Glucose-Capillary: 178 mg/dL — ABNORMAL HIGH (ref 70–99)
Glucose-Capillary: 164 mg/dL — ABNORMAL HIGH (ref 70–99)
Glucose-Capillary: 145 mg/dL — ABNORMAL HIGH (ref 70–99)

## 2020-10-14 LAB — APTT: aPTT: 28 seconds (ref 24–36)

## 2020-10-14 LAB — ETHANOL: Alcohol, Ethyl (B): <10 mg/dL (ref <10)

## 2020-10-14 LAB — GLUCOSE, CAPILLARY
Glucose-Capillary: 178 mg/dL — ABNORMAL HIGH (ref 70–99)
Glucose-Capillary: 155 mg/dL — ABNORMAL HIGH (ref 70–99)
Glucose-Capillary: 186 mg/dL — ABNORMAL HIGH (ref 70–99)

## 2020-10-14 LAB — PROTIME-INR
INR: 1 (ref 0.8–1.2)
Prothrombin Time: 12.6 seconds (ref 11.4–15.2)

## 2020-10-14 LAB — HEMOGLOBIN A1C
Hgb A1c MFr Bld: 5.5 % (ref 4.8–5.6)
Mean Plasma Glucose: 111 mg/dL

## 2020-10-14 IMAGING — CT CT ANGIO HEAD-NECK (W OR W/O PERF)
1 of 11 series · 5 of 33 positions shown · IV contrast (omnipaque)
Comparison: CT and MRI earlier today.

CLINICAL DATA: Speech disturbance.  Witnessed seizure.

EXAM:
CT ANGIOGRAPHY HEAD AND NECK
TECHNIQUE: Multidetector CT imaging of the head and neck was performed using
the standard protocol during bolus administration of intravenous
contrast. Multiplanar CT image reconstructions and MIPs were
obtained to evaluate the vascular anatomy. Carotid stenosis
measurements (when applicable) are obtained utilizing NASCET
criteria, using the distal internal carotid diameter as the
denominator.
CONTRAST:  75mL OMNIPAQUE IOHEXOL 350 MG/ML SOLN

[Series 12: ax thin · axial · 0.40mm/px · z∈[-328,-86]mm · 5 of 366 slices shown]
[im 61/366  soft-tissue]
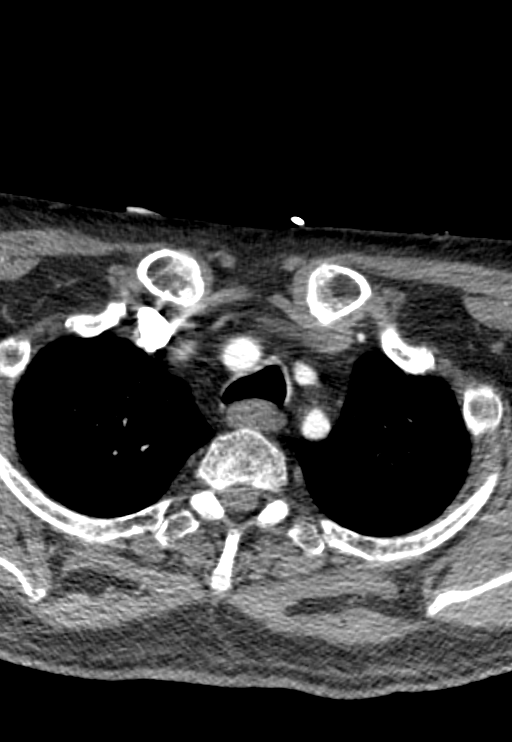
[im 122/366  bone]
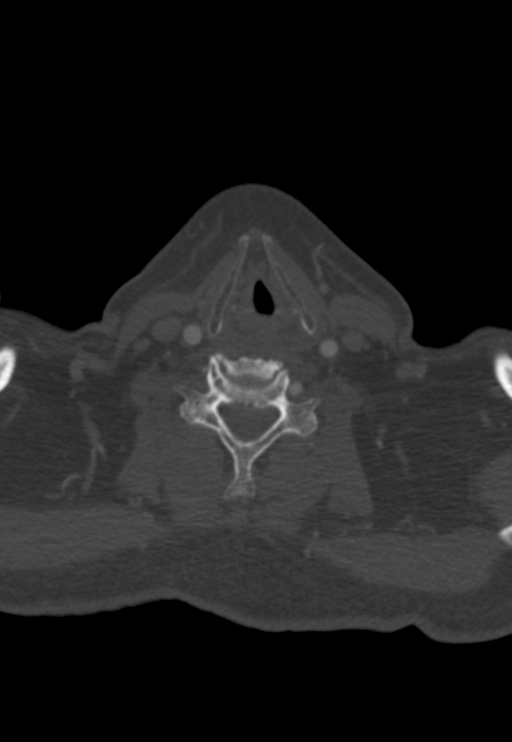
[im 183/366  soft-tissue]
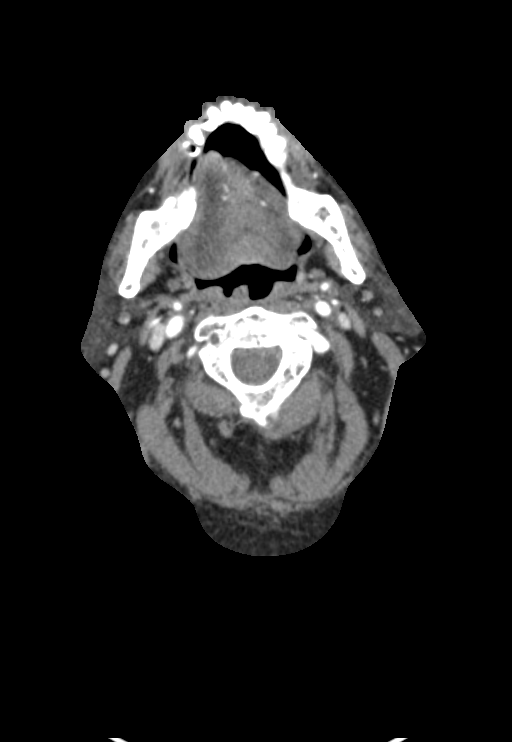
[im 244/366  bone]
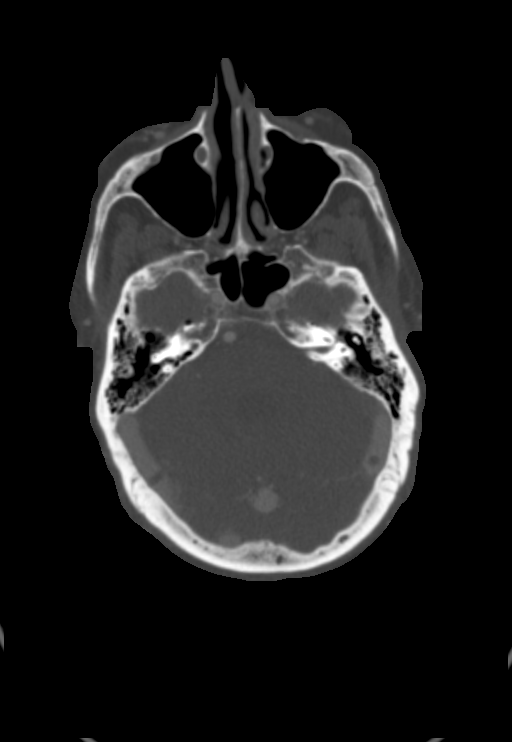
[im 305/366  soft-tissue]
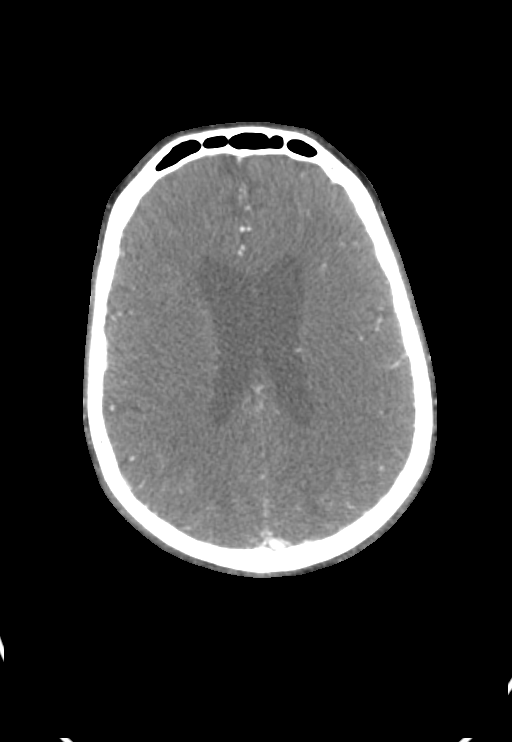

[5 of 33 positions shown; findings below may reference images not displayed]

FINDINGS: CT HEAD FINDINGS

Brain: There is worsening of cortical and subcortical edema compared
to the CT scan done earlier today. This affects both cerebral
hemispheres and the cerebellum and is primarily subcortical,
consistent with posterior reversible encephalopathy as noted by MRI.
No evidence of hemorrhage. No mass, hydrocephalus or extra-axial
collection.

Vascular: There is atherosclerotic calcification of the major
vessels at the base of the brain.

Skull: Negative

Sinuses: Clear

Orbits: Normal

Review of the MIP images confirms the above findings

CTA NECK FINDINGS

Aortic arch: Aortic atherosclerosis. Branching pattern is normal
without origin stenosis.

Right carotid system: Common carotid artery widely patent to the
bifurcation region. Calcified plaque at the carotid bifurcation but
no stenosis. Cervical ICA is widely patent.

Left carotid system: Common carotid artery widely patent to the
bifurcation. Carotid bifurcation is normal. Cervical ICA is widely
patent.

Vertebral arteries: Both vertebral artery origins are widely patent.
The left vertebral artery is strongly dominant. Both vertebral
arteries are patent to the foramen magnum.

Skeleton: Ordinary mid cervical spondylosis.

Other neck: No mass or lymphadenopathy.  Normal

Upper chest: Normal

Review of the MIP images confirms the above findings

CTA HEAD FINDINGS

Anterior circulation: Both internal carotid arteries are patent
through the skull base and siphon regions. Minimal siphon
atherosclerotic calcification but no stenosis. The anterior and
middle cerebral vessels are widely patent. No large vessel occlusion
or correctable proximal stenosis. No aneurysm or vascular
malformation.

Posterior circulation: Both vertebral arteries are widely patent
through the foramen magnum to the basilar. No basilar stenosis.
Posterior circulation branch vessels appear normal.

Venous sinuses: Patent and normal.

Anatomic variants: None significant.

Review of the MIP images confirms the above findings
IMPRESSION: Worsening of brain edema consistent with worsened imaging findings
of posterior reversible encephalopathy. No evidence of hemorrhage.

No large vessel occlusion.

Mild aortic atherosclerotic calcification. Mild carotid bifurcation
calcification on the right but without stenosis on either side.

No intracranial stenosis, aneurysm or vascular malformation.

## 2020-10-14 IMAGING — CT CT HEAD W/O CM
3 series · 16 of 47 positions shown, 19 images · non-contrast
Comparison: None.

CLINICAL DATA: Seizure, nontraumatic (Age >= 41y)

EXAM:
CT HEAD WITHOUT CONTRAST
TECHNIQUE: Contiguous axial images were obtained from the base of the skull
through the vertex without intravenous contrast.

[Series 3: head wo · axial · 0.46mm/px · z∈[-124,+31]mm · 10 of 37 slices shown, 13 images]
[im 3/37  brain]
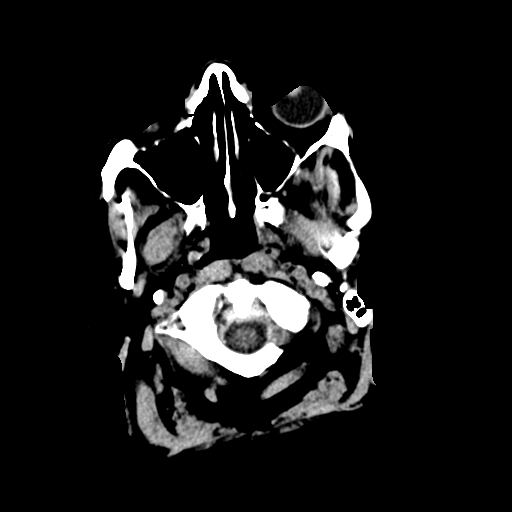
[im 3/37  bone]
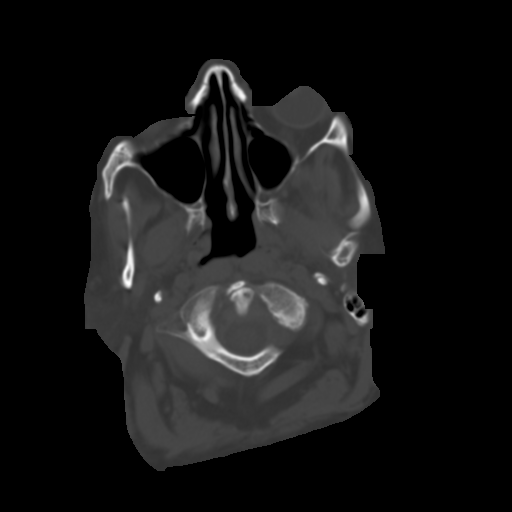
[im 7/37  brain]
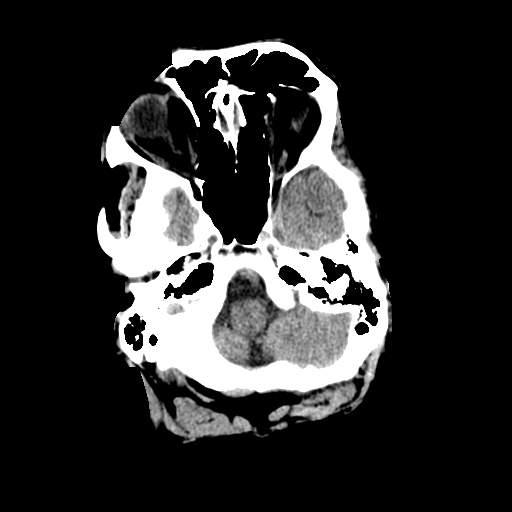
[im 10/37  brain]
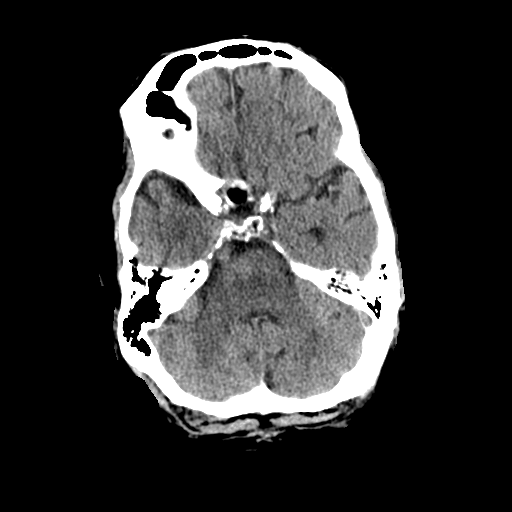
[im 13/37  brain]
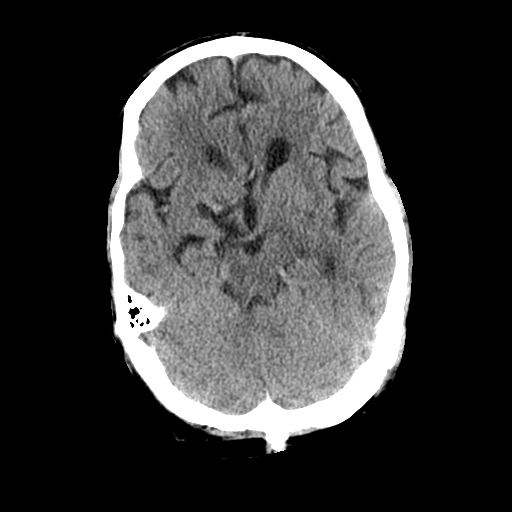
[im 17/37  brain]
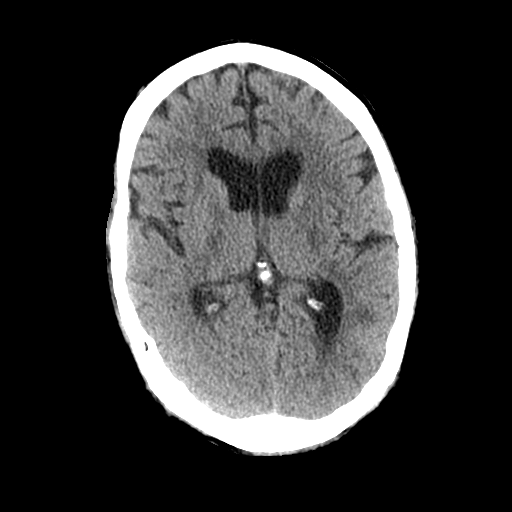
[im 17/37  bone]
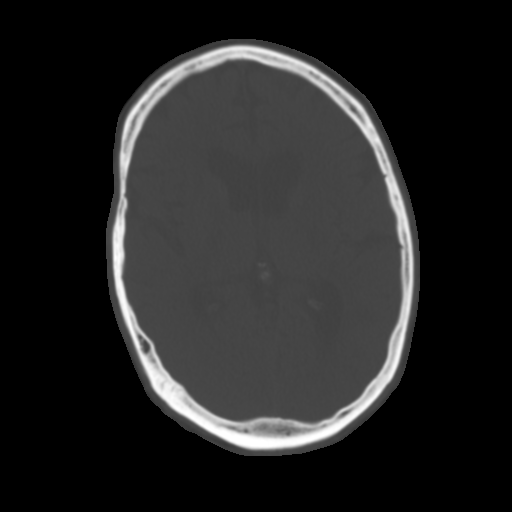
[im 20/37  brain]
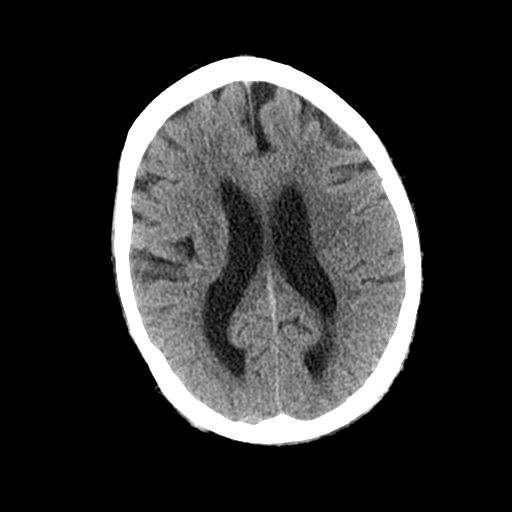
[im 24/37  brain]
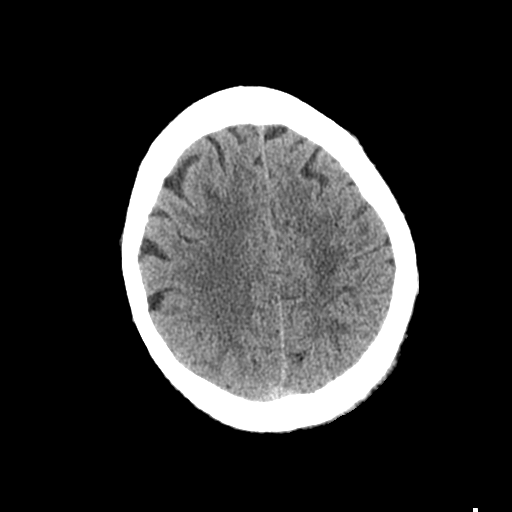
[im 28/37  brain]
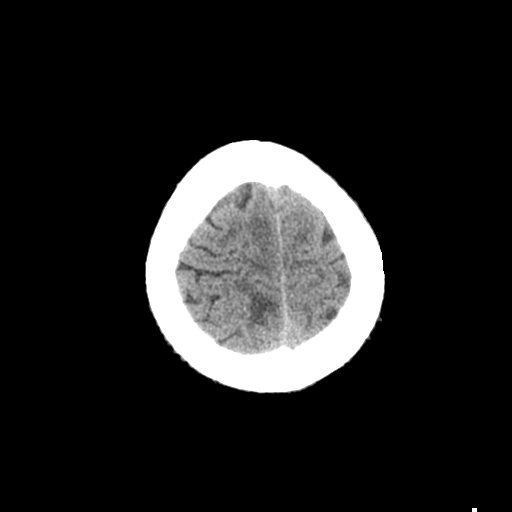
[im 30/37  brain]
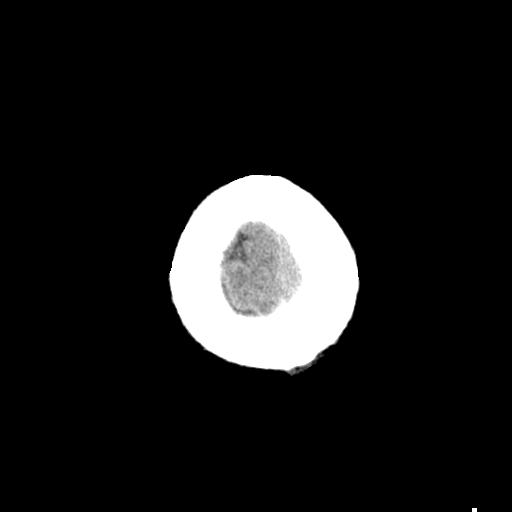
[im 30/37  bone]
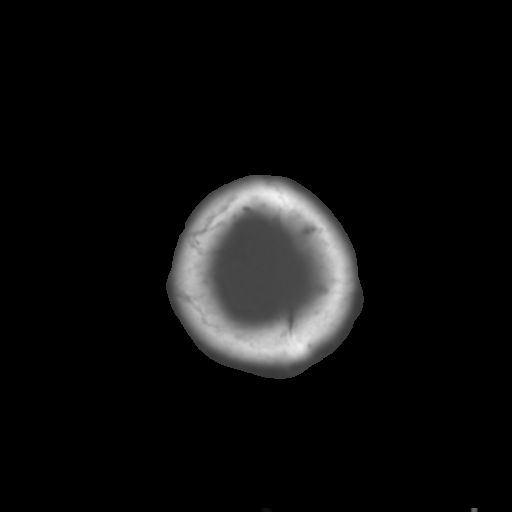
[im 34/37  brain]
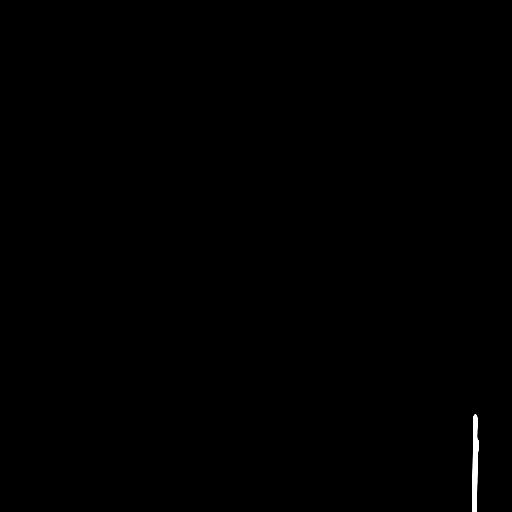

[Series 4: coronal soft tissue · coronal · 0.35mm/px · 3 of 77 slices shown]
[im 26/77  brain]
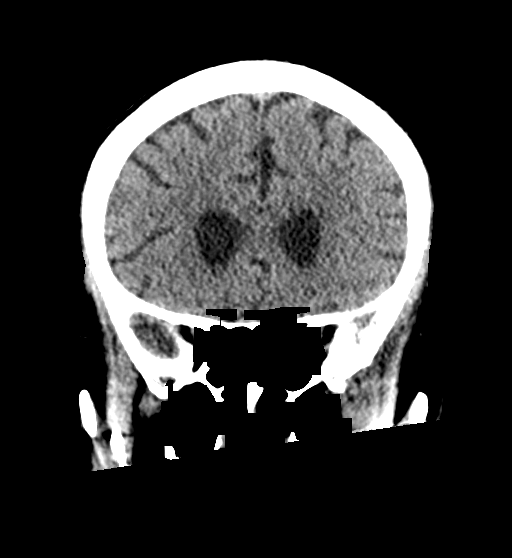
[im 34/77  brain]
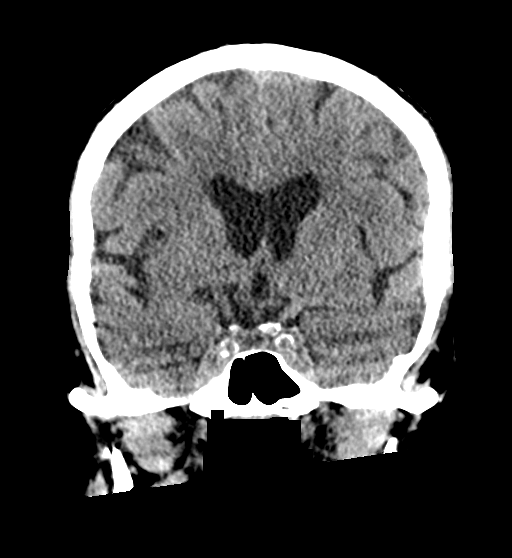
[im 43/77  brain]
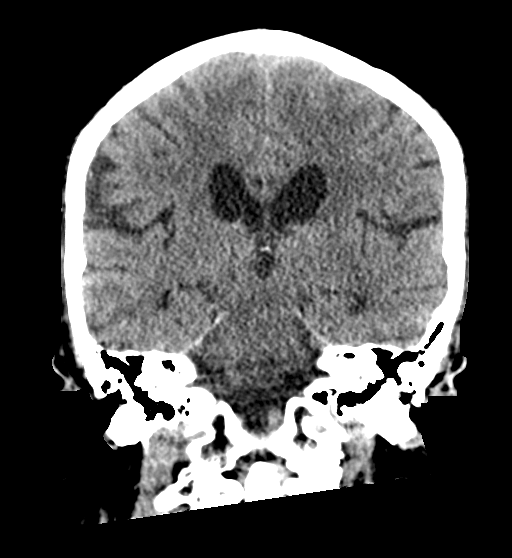

[Series 5: sagittal soft tissue · sagittal · 0.41mm/px · 3 of 57 slices shown]
[im 20/57  brain]
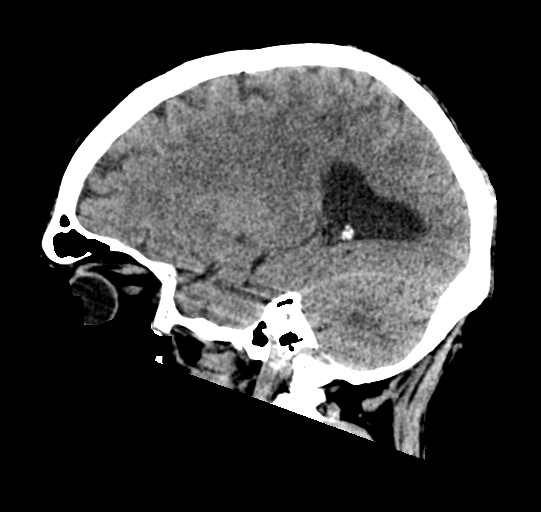
[im 29/57  brain]
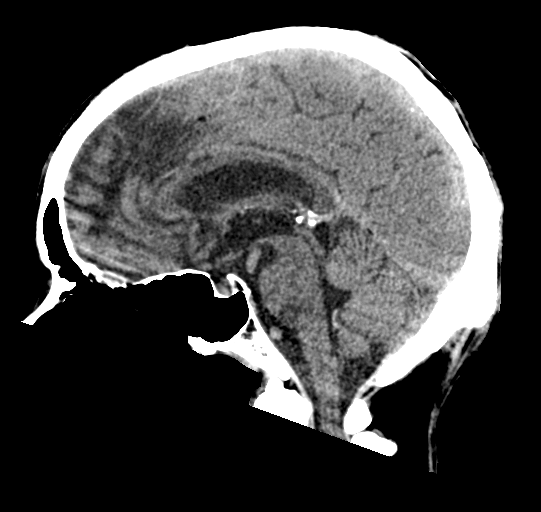
[im 37/57  brain]
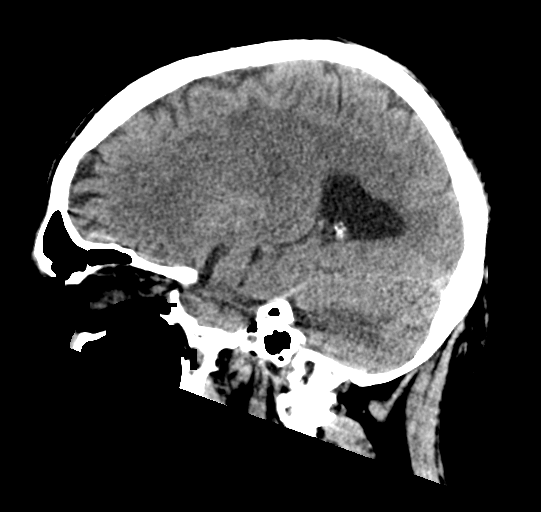

[16 of 47 positions shown; findings below may reference images not displayed]

FINDINGS: Brain: Mild parenchymal volume loss is commensurate with the
patient's age. Mild periventricular white matter changes are present
likely reflecting the sequela of small vessel ischemia. Since the
prior examination, a focus of low attenuation is seen within the
para falcine right frontal lobe in keeping with a subacute cortical
infarct. No associated abnormal mass effect or midline shift. No
acute intracranial hemorrhage. Ventricular size is normal. The
cerebellum is unremarkable.

Vascular: No asymmetric hyperdense vasculature at the skull base.

Skull: Intact

Sinuses/Orbits: The visualized paranasal sinuses are clear. The
orbits are unremarkable.

Other: Mastoid air cells and middle ear cavities are clear.
IMPRESSION: Subacute right parafalcine parietal cortical infarct. No associated
abnormal mass effect or acute intracranial hemorrhage.

Stable senescent change

## 2020-10-14 IMAGING — MR MR HEAD W/O CM
10 of 14 series · 31 of 48 positions shown · non-contrast
Comparison: Head CT [DATE] and MRI [DATE]

CLINICAL DATA: Stroke, follow up.  Seizure.  Hypertensive urgency.

EXAM:
MRI HEAD WITHOUT CONTRAST
TECHNIQUE: Multiplanar, multiecho pulse sequences of the brain and surrounding
structures were obtained without intravenous contrast.

[Series 5: ax dwi_tracew · axial · 3.0mm · 0.65mm/px · z∈[-71,+76]mm · 4 of 46 slices shown]
[im 1/46]
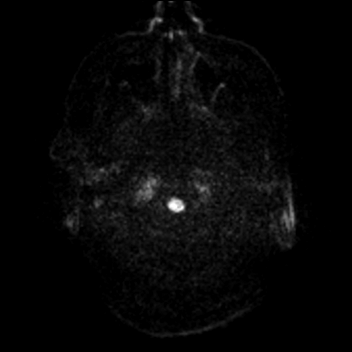
[im 16/46]
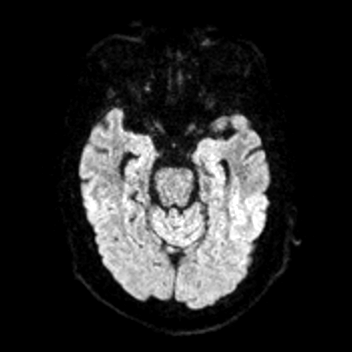
[im 31/46]
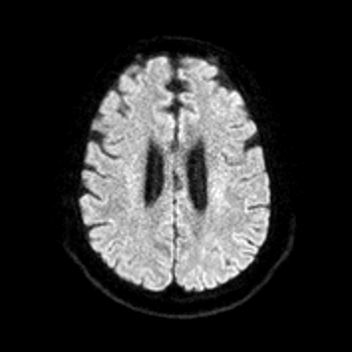
[im 46/46]
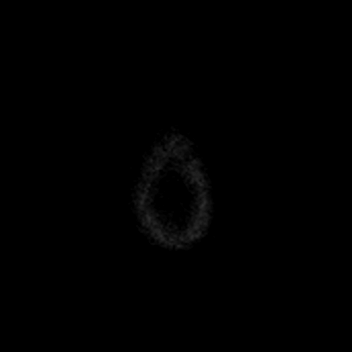

[Series 6: ax dwi_adc · axial · 3.0mm · 0.65mm/px · z∈[-71,+76]mm · 3 of 46 slices shown]
[im 1/46]
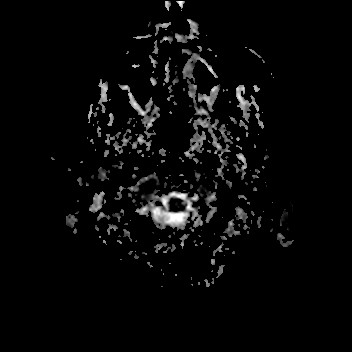
[im 23/46]
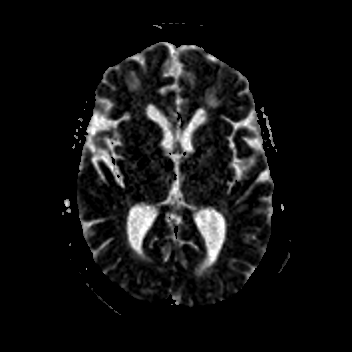
[im 46/46]
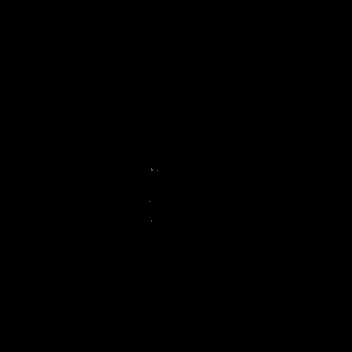

[Series 7: cor dwi_tracew · coronal · 5.0mm · 0.60mm/px · 2 of 36 slices shown]
[im 1/36]
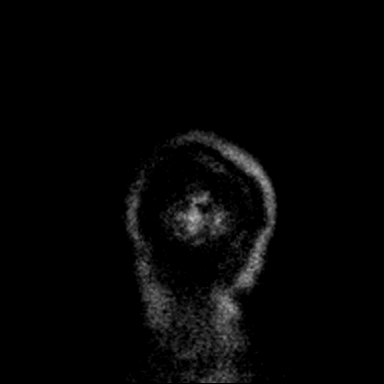
[im 36/36]
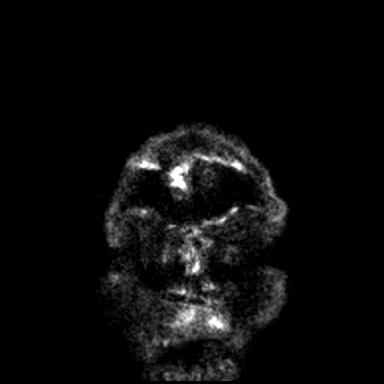

[Series 9: T1 · sagittal · 5.0mm · 0.62mm/px · 2 of 22 slices shown (1 of 2)]
[im 1/22]
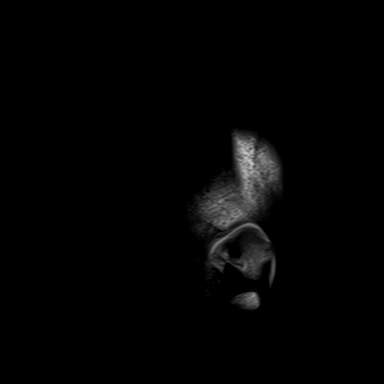
[im 22/22]
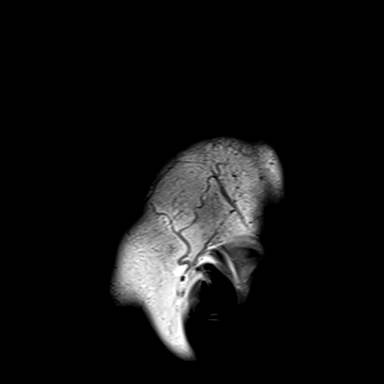

[Series 10: T2 · axial · 5.0mm · 0.53mm/px · z∈[-70,+74]mm · 2 of 25 slices shown (1 of 3)]
[im 1/25]
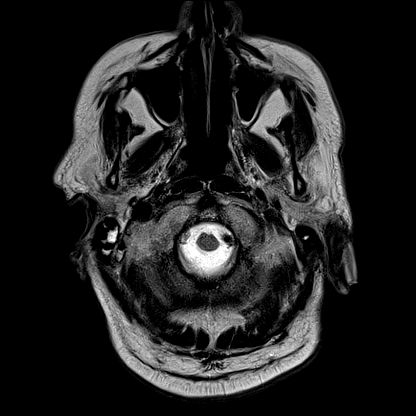
[im 25/25]
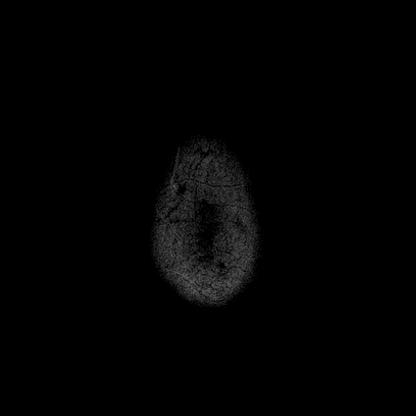

[Series 15: FLAIR · axial · 3.0mm · 0.53mm/px · z∈[-95,+99]mm · 4 of 55 slices shown (1 of 2)]
[im 1/55]
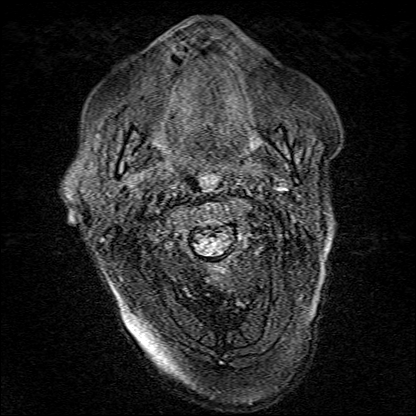
[im 19/55]
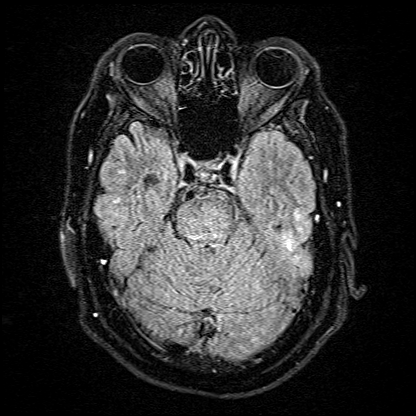
[im 37/55]
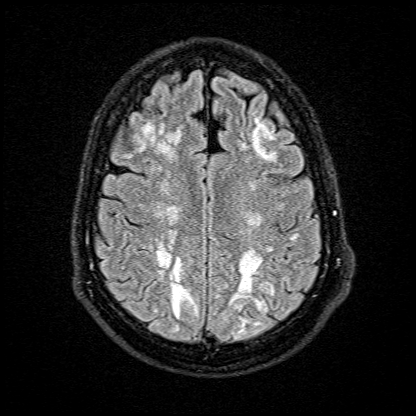
[im 55/55]
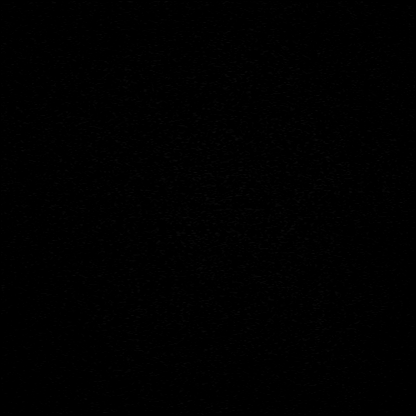

[Series 16: T1 · axial · 1.0mm · 0.98mm/px · z∈[-75,+83]mm · 8 of 160 slices shown (2 of 2)]
[im 1/160]
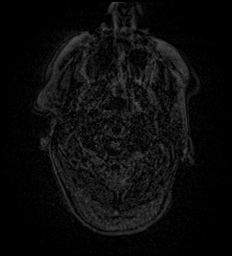
[im 32/160]
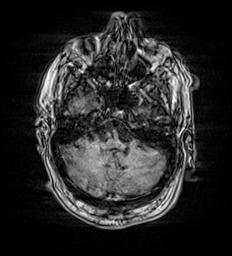
[im 48/160]
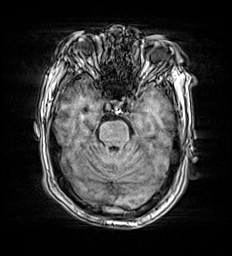
[im 64/160]
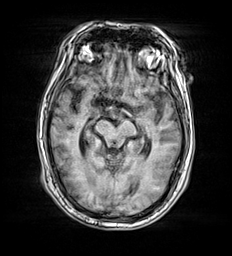
[im 96/160]
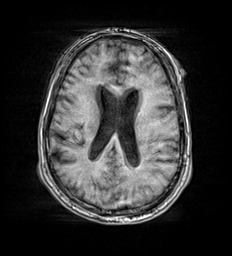
[im 112/160]
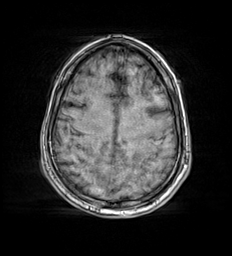
[im 128/160]
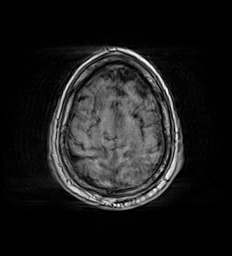
[im 160/160]
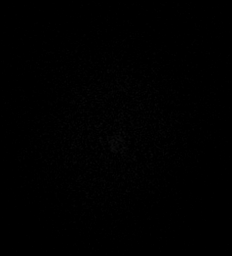

[Series 17: T2 · coronal · 5.0mm · 0.60mm/px · 2 of 28 slices shown (2 of 3)]
[im 1/28]
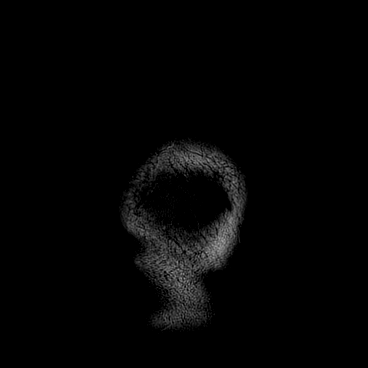
[im 28/28]
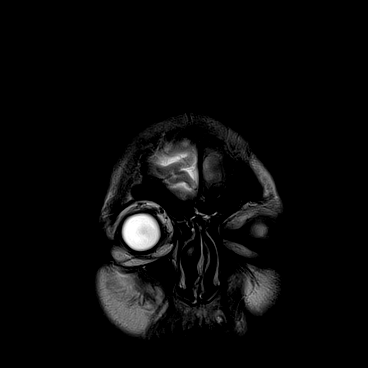

[Series 18: T2 · coronal · 3.0mm · 0.23mm/px · 2 of 35 slices shown (3 of 3)]
[im 1/35]
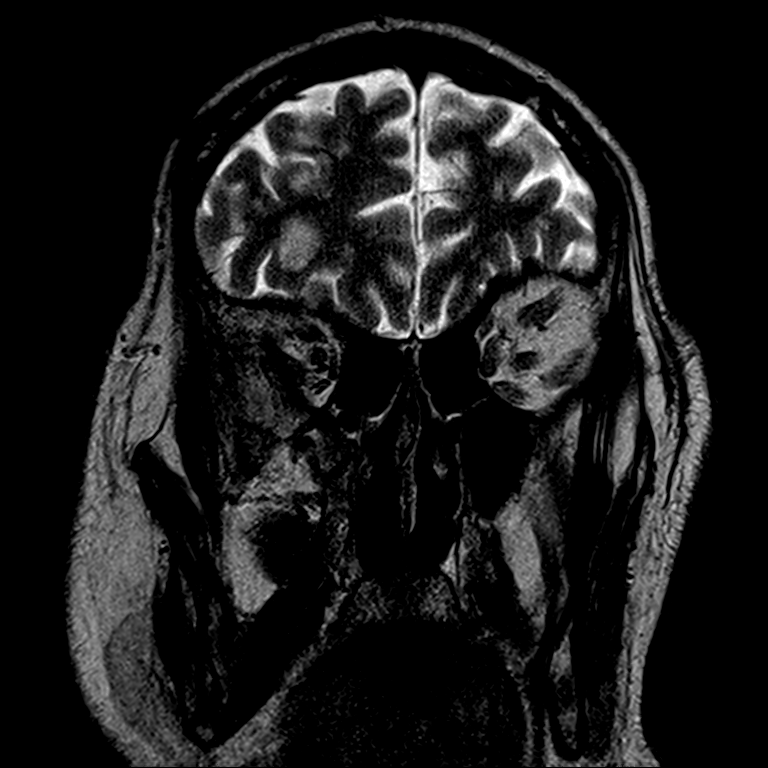
[im 35/35]
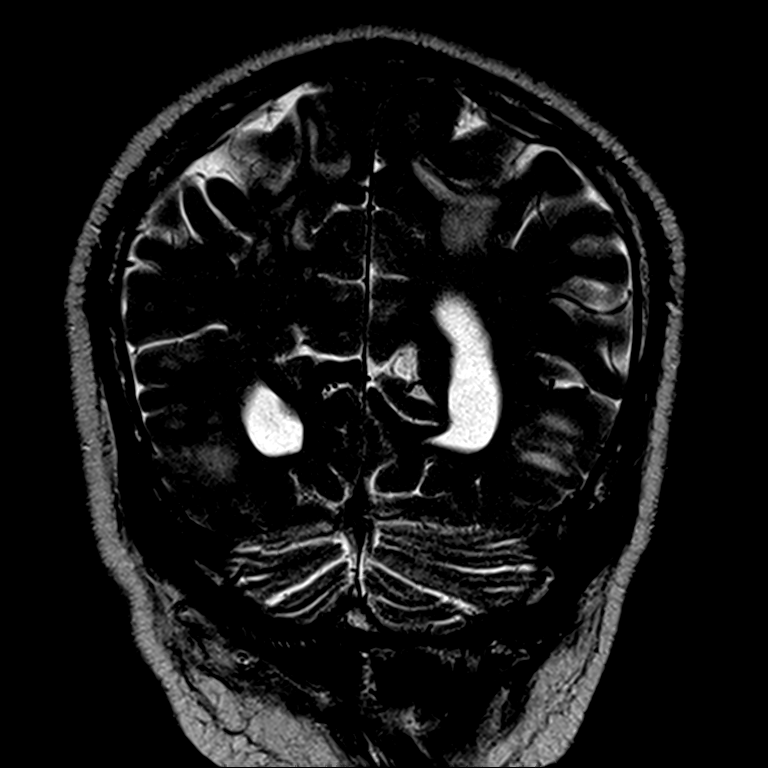

[Series 19: FLAIR · coronal · 3.0mm · 0.35mm/px · 2 of 35 slices shown (2 of 2)]
[im 1/35]
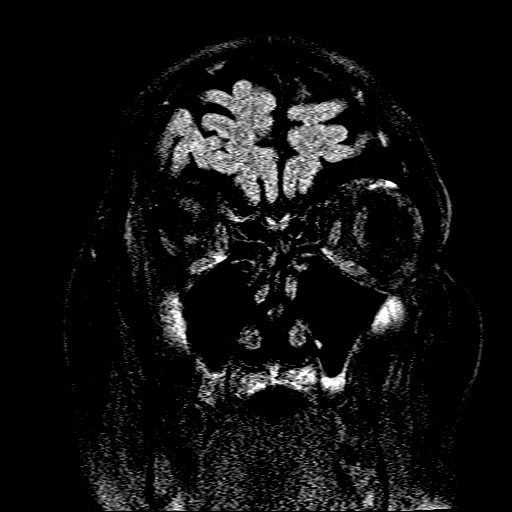
[im 35/35]
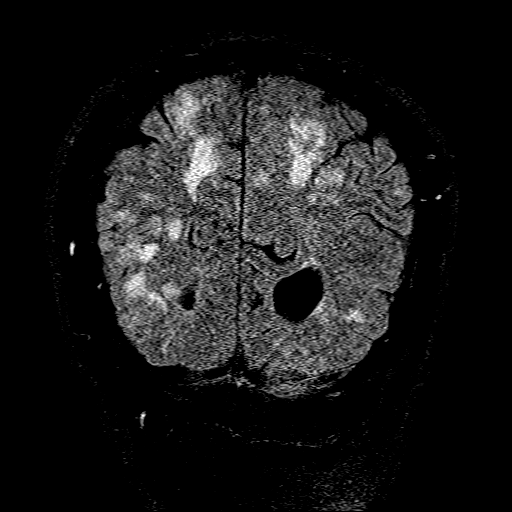

[31 of 48 positions shown; findings below may reference images not displayed]

FINDINGS: Brain: There are extensive patchy areas of T2 hyperintensity
compatible with edema throughout both cerebral and cerebellar
hemispheres in a symmetric fashion. This is greatest in the
subcortical white matter although the deep white matter and cortex
are also involved. Involvement is greatest in the temporal-occipital
regions and in the frontoparietal regions towards the vertex. There
is also patchy T2 hyperintensity in the pons. There is mild
involvement of the left thalamus. There is no restricted diffusion
to indicate an acute infarct. No intracranial hemorrhage, midline
shift, or extra-axial fluid collection is identified. There is mild
cerebral atrophy.

Vascular: Major intracranial vascular flow voids are preserved.

Skull and upper cervical spine: Unremarkable bone marrow signal.

Sinuses/Orbits: Unremarkable orbits. Clear paranasal sinuses. Small
bilateral mastoid effusions.

Other: None.
IMPRESSION: 1. Extensive patchy, symmetric edema throughout both cerebral and
cerebellar hemispheres most suspicious for posterior reversible
encephalopathy syndrome (PRES).
2. No acute infarct.

## 2020-10-14 IMAGING — DX DG CHEST 1V PORT
1 series · 1 of 1 positions shown · non-contrast
Comparison: Chest radiograph [DATE]

CLINICAL DATA: Aspiration

EXAM:
PORTABLE CHEST 1 VIEW

[chest ap]
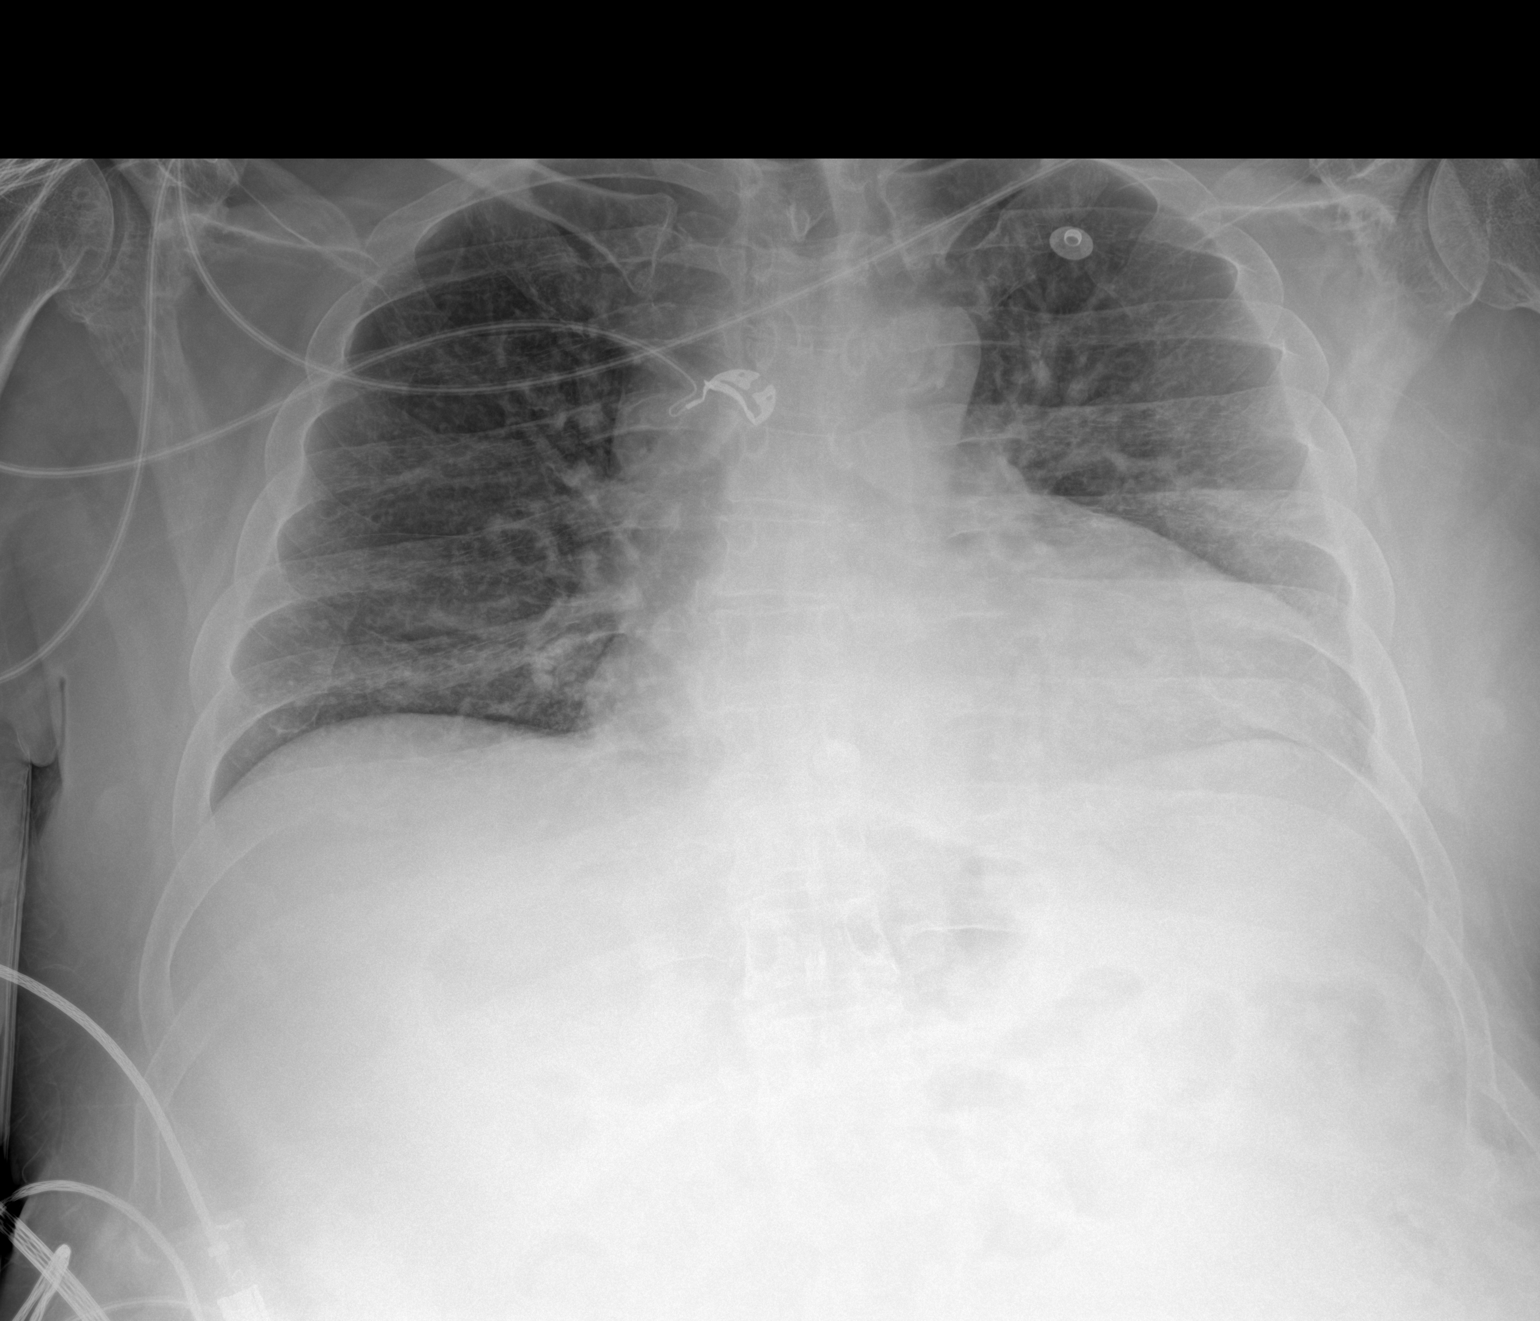

[1 of 1 positions shown; findings below may reference images not displayed]

FINDINGS: The heart is mildly enlarged, unchanged. The mediastinal contours
are stable, allowing for low lung volumes and AP technique.

Lung volumes are low. There are patchy opacities in the right base.
There is vascular congestion without definite overt pulmonary edema.
There is no pleural effusion or pneumothorax.

There is no acute osseous abnormality.
IMPRESSION: 1. Patchy opacities in the right base may reflect infection or
aspiration.
2. Cardiomegaly with vascular congestion but no definite overt
pulmonary edema.

## 2020-10-14 MED ORDER — ONDANSETRON HCL 4 MG/2ML IJ SOLN
INTRAMUSCULAR | Status: AC
  Administered 2020-10-14: 4 mg via INTRAVENOUS
  Filled 2020-10-14: qty 2

## 2020-10-14 MED ORDER — SENNOSIDES-DOCUSATE SODIUM 8.6-50 MG PO TABS: 1.0000 | ORAL_TABLET | Freq: Every evening | ORAL | Status: DC | PRN

## 2020-10-14 MED ORDER — INSULIN GLARGINE-YFGN 100 UNIT/ML ~~LOC~~ SOLN: 20.0000 [IU] | Freq: Every day | SUBCUTANEOUS | Status: DC

## 2020-10-14 MED ORDER — LEVETIRACETAM IN NACL 1500 MG/100ML IV SOLN
1500.0000 mg | Freq: Once | INTRAVENOUS | Status: AC
  Administered 2020-10-14: 1500 mg via INTRAVENOUS
  Filled 2020-10-14: qty 100

## 2020-10-14 MED ORDER — ENOXAPARIN SODIUM 40 MG/0.4ML IJ SOSY
40.0000 mg | PREFILLED_SYRINGE | INTRAMUSCULAR | Status: DC
  Administered 2020-10-14 – 2020-10-20 (×7): 40 mg via SUBCUTANEOUS
  Filled 2020-10-14 (×7): qty 0.4

## 2020-10-14 MED ORDER — HYDRALAZINE HCL 20 MG/ML IJ SOLN
20.0000 mg | Freq: Once | INTRAMUSCULAR | Status: AC
  Administered 2020-10-14: 20 mg via INTRAVENOUS
  Filled 2020-10-14: qty 1

## 2020-10-14 MED ORDER — LORAZEPAM 2 MG/ML IJ SOLN
1.0000 mg | INTRAMUSCULAR | Status: DC | PRN
  Filled 2020-10-14: qty 1

## 2020-10-14 MED ORDER — ZIPRASIDONE MESYLATE 20 MG IM SOLR
10.0000 mg | Freq: Four times a day (QID) | INTRAMUSCULAR | Status: DC | PRN
  Administered 2020-10-14: 10 mg via INTRAMUSCULAR
  Filled 2020-10-14: qty 20

## 2020-10-14 MED ORDER — METOPROLOL TARTRATE 5 MG/5ML IV SOLN
10.0000 mg | Freq: Four times a day (QID) | INTRAVENOUS | Status: DC
  Administered 2020-10-14 – 2020-10-16 (×6): 10 mg via INTRAVENOUS
  Filled 2020-10-14 (×6): qty 10

## 2020-10-14 MED ORDER — ACETAMINOPHEN 160 MG/5ML PO SOLN
650.0000 mg | ORAL | Status: DC | PRN
  Filled 2020-10-14: qty 20.3

## 2020-10-14 MED ORDER — PROMETHAZINE HCL 25 MG PO TABS
25.0000 mg | ORAL_TABLET | Freq: Three times a day (TID) | ORAL | Status: DC | PRN
  Filled 2020-10-14: qty 1

## 2020-10-14 MED ORDER — CLONIDINE HCL 0.3 MG/24HR TD PTWK: 0.3000 mg | MEDICATED_PATCH | TRANSDERMAL | Status: DC

## 2020-10-14 MED ORDER — CLONIDINE HCL 0.3 MG/24HR TD PTWK
0.3000 mg | MEDICATED_PATCH | TRANSDERMAL | Status: DC
  Administered 2020-10-14: 0.3 mg via TRANSDERMAL
  Filled 2020-10-14: qty 1

## 2020-10-14 MED ORDER — QUETIAPINE FUMARATE 25 MG PO TABS
25.0000 mg | ORAL_TABLET | Freq: Two times a day (BID) | ORAL | Status: DC
  Administered 2020-10-16 – 2020-10-18 (×5): 25 mg via ORAL
  Filled 2020-10-14 (×5): qty 1

## 2020-10-14 MED ORDER — CLOPIDOGREL BISULFATE 75 MG PO TABS: 75.0000 mg | ORAL_TABLET | Freq: Every day | ORAL | Status: DC

## 2020-10-14 MED ORDER — METOPROLOL TARTRATE 50 MG PO TABS: 100.0000 mg | ORAL_TABLET | Freq: Two times a day (BID) | ORAL | Status: DC

## 2020-10-14 MED ORDER — INSULIN ASPART 100 UNIT/ML IJ SOLN
0.0000 [IU] | Freq: Three times a day (TID) | INTRAMUSCULAR | Status: DC
  Administered 2020-10-14: 2 [IU] via SUBCUTANEOUS
  Filled 2020-10-14: qty 1

## 2020-10-14 MED ORDER — PRAZOSIN HCL 1 MG PO CAPS
1.0000 mg | ORAL_CAPSULE | Freq: Two times a day (BID) | ORAL | Status: DC | PRN
  Filled 2020-10-14: qty 1

## 2020-10-14 MED ORDER — HYDROXYZINE HCL 50 MG PO TABS
50.0000 mg | ORAL_TABLET | Freq: Four times a day (QID) | ORAL | Status: DC | PRN
  Filled 2020-10-14: qty 1

## 2020-10-14 MED ORDER — METOPROLOL TARTRATE 5 MG/5ML IV SOLN: 5.0000 mg | Freq: Two times a day (BID) | INTRAVENOUS | Status: DC

## 2020-10-14 MED ORDER — STROKE: EARLY STAGES OF RECOVERY BOOK: Freq: Once | Status: DC

## 2020-10-14 MED ORDER — LEVETIRACETAM IN NACL 500 MG/100ML IV SOLN
500.0000 mg | Freq: Two times a day (BID) | INTRAVENOUS | Status: DC
  Filled 2020-10-14 (×2): qty 100

## 2020-10-14 MED ORDER — LORAZEPAM 2 MG/ML IJ SOLN
1.0000 mg | INTRAMUSCULAR | Status: AC
  Administered 2020-10-14: 1 mg via INTRAVENOUS

## 2020-10-14 MED ORDER — GABAPENTIN 100 MG PO CAPS
100.0000 mg | ORAL_CAPSULE | Freq: Two times a day (BID) | ORAL | Status: DC
  Administered 2020-10-16 – 2020-10-20 (×9): 100 mg via ORAL
  Filled 2020-10-14 (×10): qty 1

## 2020-10-14 MED ORDER — PRAZOSIN HCL 2 MG PO CAPS
4.0000 mg | ORAL_CAPSULE | Freq: Every day | ORAL | Status: DC
  Administered 2020-10-16 – 2020-10-19 (×4): 4 mg via ORAL
  Filled 2020-10-14 (×8): qty 2

## 2020-10-14 MED ORDER — TRAZODONE HCL 50 MG PO TABS
150.0000 mg | ORAL_TABLET | Freq: Every day | ORAL | Status: DC
  Administered 2020-10-16 – 2020-10-19 (×4): 150 mg via ORAL
  Filled 2020-10-14: qty 1
  Filled 2020-10-14 (×2): qty 3
  Filled 2020-10-14: qty 1

## 2020-10-14 MED ORDER — GABAPENTIN 300 MG PO CAPS
300.0000 mg | ORAL_CAPSULE | Freq: Every day | ORAL | Status: DC
  Administered 2020-10-16 – 2020-10-19 (×4): 300 mg via ORAL
  Filled 2020-10-14 (×4): qty 1

## 2020-10-14 MED ORDER — LORAZEPAM 2 MG/ML IJ SOLN
INTRAMUSCULAR | Status: AC
  Administered 2020-10-14: 2 mg via INTRAVENOUS
  Filled 2020-10-14: qty 1

## 2020-10-14 MED ORDER — HALOPERIDOL LACTATE 5 MG/ML IJ SOLN
INTRAMUSCULAR | Status: AC
  Filled 2020-10-14: qty 1

## 2020-10-14 MED ORDER — LORATADINE 10 MG PO TABS
10.0000 mg | ORAL_TABLET | Freq: Every day | ORAL | Status: DC
  Administered 2020-10-16 – 2020-10-20 (×5): 10 mg via ORAL
  Filled 2020-10-14 (×5): qty 1

## 2020-10-14 MED ORDER — ASPIRIN 81 MG PO CHEW
81.0000 mg | CHEWABLE_TABLET | Freq: Every day | ORAL | Status: DC
  Administered 2020-10-16 – 2020-10-20 (×5): 81 mg via ORAL
  Filled 2020-10-14 (×5): qty 1

## 2020-10-14 MED ORDER — SODIUM CHLORIDE 0.9 % IV SOLN: INTRAVENOUS | Status: DC

## 2020-10-14 MED ORDER — ATORVASTATIN CALCIUM 20 MG PO TABS
40.0000 mg | ORAL_TABLET | Freq: Every day | ORAL | Status: DC
  Administered 2020-10-16 – 2020-10-20 (×5): 40 mg via ORAL
  Filled 2020-10-14 (×5): qty 2

## 2020-10-14 MED ORDER — ONDANSETRON HCL 4 MG/2ML IJ SOLN: 4.0000 mg | Freq: Once | INTRAMUSCULAR | Status: AC

## 2020-10-14 MED ORDER — DOCUSATE SODIUM 100 MG PO CAPS
200.0000 mg | ORAL_CAPSULE | Freq: Two times a day (BID) | ORAL | Status: DC
  Administered 2020-10-16 – 2020-10-20 (×8): 200 mg via ORAL
  Filled 2020-10-14 (×8): qty 2

## 2020-10-14 MED ORDER — LORAZEPAM 2 MG/ML IJ SOLN: 2.0000 mg | Freq: Once | INTRAMUSCULAR | Status: AC

## 2020-10-14 MED ORDER — LABETALOL HCL 5 MG/ML IV SOLN
20.0000 mg | INTRAVENOUS | Status: DC | PRN
  Administered 2020-10-15 – 2020-10-16 (×3): 20 mg via INTRAVENOUS
  Filled 2020-10-14 (×3): qty 4

## 2020-10-14 MED ORDER — LEVETIRACETAM IN NACL 500 MG/100ML IV SOLN
500.0000 mg | Freq: Two times a day (BID) | INTRAVENOUS | Status: DC
  Administered 2020-10-14 – 2020-10-17 (×6): 500 mg via INTRAVENOUS
  Filled 2020-10-14 (×7): qty 100

## 2020-10-14 MED ORDER — INSULIN ASPART 100 UNIT/ML IJ SOLN
0.0000 [IU] | INTRAMUSCULAR | Status: DC
  Administered 2020-10-14: 1 [IU] via SUBCUTANEOUS
  Administered 2020-10-14 (×3): 2 [IU] via SUBCUTANEOUS
  Administered 2020-10-15: 3 [IU] via SUBCUTANEOUS
  Administered 2020-10-15 (×2): 1 [IU] via SUBCUTANEOUS
  Administered 2020-10-15 – 2020-10-16 (×6): 2 [IU] via SUBCUTANEOUS
  Administered 2020-10-16: 1 [IU] via SUBCUTANEOUS
  Administered 2020-10-17: 2 [IU] via SUBCUTANEOUS
  Filled 2020-10-14 (×13): qty 1

## 2020-10-14 MED ORDER — CHLORHEXIDINE GLUCONATE CLOTH 2 % EX PADS
6.0000 | MEDICATED_PAD | Freq: Every day | CUTANEOUS | Status: DC
  Administered 2020-10-14 – 2020-10-18 (×3): 6 via TOPICAL
  Filled 2020-10-14: qty 6

## 2020-10-14 MED ORDER — AMLODIPINE BESYLATE 10 MG PO TABS
10.0000 mg | ORAL_TABLET | Freq: Every day | ORAL | Status: DC
  Administered 2020-10-16 – 2020-10-20 (×5): 10 mg via ORAL
  Filled 2020-10-14 (×4): qty 1
  Filled 2020-10-14: qty 2

## 2020-10-14 MED ORDER — ACETAMINOPHEN 325 MG PO TABS
650.0000 mg | ORAL_TABLET | ORAL | Status: DC | PRN
  Administered 2020-10-17: 650 mg via ORAL
  Filled 2020-10-14: qty 2

## 2020-10-14 MED ORDER — CYCLOBENZAPRINE HCL 10 MG PO TABS
10.0000 mg | ORAL_TABLET | Freq: Three times a day (TID) | ORAL | Status: DC | PRN
  Administered 2020-10-16 – 2020-10-18 (×2): 10 mg via ORAL
  Filled 2020-10-14 (×3): qty 1

## 2020-10-14 MED ORDER — ACETAMINOPHEN 650 MG RE SUPP
650.0000 mg | RECTAL | Status: DC | PRN
  Administered 2020-10-14: 650 mg via RECTAL
  Filled 2020-10-14: qty 2

## 2020-10-14 MED ORDER — ASPIRIN 300 MG RE SUPP
300.0000 mg | Freq: Once | RECTAL | Status: AC
  Administered 2020-10-14: 300 mg via RECTAL
  Filled 2020-10-14: qty 1

## 2020-10-14 MED ORDER — LATANOPROST 0.005 % OP SOLN
1.0000 [drp] | Freq: Every day | OPHTHALMIC | Status: DC
  Administered 2020-10-14 – 2020-10-19 (×6): 1 [drp] via OPHTHALMIC
  Filled 2020-10-14: qty 2.5

## 2020-10-14 MED ORDER — PANTOPRAZOLE SODIUM 40 MG PO TBEC
40.0000 mg | DELAYED_RELEASE_TABLET | Freq: Every day | ORAL | Status: DC
  Administered 2020-10-16 – 2020-10-20 (×5): 40 mg via ORAL
  Filled 2020-10-14 (×5): qty 1

## 2020-10-14 MED ORDER — HALOPERIDOL LACTATE 5 MG/ML IJ SOLN
2.0000 mg | Freq: Once | INTRAMUSCULAR | Status: AC
  Administered 2020-10-14: 2 mg via INTRAVENOUS

## 2020-10-14 MED ORDER — NICARDIPINE HCL IN NACL 20-0.86 MG/200ML-% IV SOLN
3.0000 mg/h | INTRAVENOUS | Status: DC
  Administered 2020-10-14 (×2): 5 mg/h via INTRAVENOUS
  Administered 2020-10-14: 3 mg/h via INTRAVENOUS
  Administered 2020-10-15: 5.5 mg/h via INTRAVENOUS
  Administered 2020-10-15: 15 mg/h via INTRAVENOUS
  Administered 2020-10-15: 8 mg/h via INTRAVENOUS
  Administered 2020-10-15: 10 mg/h via INTRAVENOUS
  Administered 2020-10-15: 8 mg/h via INTRAVENOUS
  Administered 2020-10-15 – 2020-10-16 (×4): 10 mg/h via INTRAVENOUS
  Filled 2020-10-14 (×15): qty 200

## 2020-10-14 MED ORDER — IOHEXOL 350 MG/ML SOLN
75.0000 mL | Freq: Once | INTRAVENOUS | Status: AC | PRN
  Administered 2020-10-14: 75 mL via INTRAVENOUS

## 2020-10-14 NOTE — ED Notes (Signed)
Fsbs 145

## 2020-10-14 NOTE — Evaluation (Addendum)
Physical Therapy Evaluation Patient Details Name: Wyatt Ball. MRN: [id number] DOB: Jan 15, 1960 Today's Date: 10/14/2020  History of Present Illness  Pt is a 60 y/o M admitted on 10/14/20 with c/c of AMS & seizures. While in the ED pt had a seizure episode with significantly elevated BP. Pt found to have subacute R parietal corical infarction with AMS/acute encephalopathy likely postictal with associated seizures. PMH: Dm2, HTN, anxiety, transverse myelitis  Clinical Impression  Pt seen for PT evaluation as co-tx with OT for pt & therapists' safety. Pt with significant cognitive & communication deficits & unable to provide PLOF/home setup, nor follow simple one step commands with multimodal cuing. Pt requires MAX<>total assist overall for bed mobility but is able to engage in multiple sit<>stand at EOB. Pt with impaired midline orientation & inability to correct. Pt does not verbalize any words during session (nor makes any attempts). Pt would benefit from STR upon d/c to maximize independence with functional mobility, decrease caregiver burden, & reduce fall risk prior to return home.      Recommendations for follow up therapy are one component of a multi-disciplinary discharge planning process, led by the attending physician.  Recommendations may be updated based on patient status, additional functional criteria and insurance authorization.  Follow Up Recommendations SNF    Equipment Recommendations  None recommended by PT    Recommendations for Other Services       Precautions / Restrictions Precautions Precautions: Fall Restrictions Weight Bearing Restrictions: No      Mobility  Bed Mobility Overal bed mobility: Needs Assistance Bed Mobility: Supine to Sit;Sit to Supine     Supine to sit: Total assist;+2 for physical assistance;+2 for safety/equipment Sit to supine: Total assist;+2 for physical assistance;+2 for safety/equipment        Transfers Overall  transfer level: Needs assistance   Transfers: Sit to/from Stand Sit to Stand: MAX assist;+2 physical assistance;+2 safety/equipment         General transfer comment: Pt performs multiple sit<>stands (x4) throughout session with MAX assist +2 with HHA & therapists blocking BLE knees to prevent buckling & assistance for upright posture and to shift pelvis anteriorly.  Ambulation/Gait Ambulation/Gait assistance:  (Attempted to assist pt with weight shifting to allow him to step L at EOB but pt unable to follow commands or tactile cuing & unable to advance foot despite total assist.)              Stairs            Wheelchair Mobility    Modified Rankin (Stroke Patients Only)       Balance Overall balance assessment: Needs assistance Sitting-balance support: Feet supported;Bilateral upper extremity supported Sitting balance-Leahy Scale: Zero Sitting balance - Comments: significant L lateral lean with poor ability to correct   Standing balance support: Bilateral upper extremity supported;During functional activity Standing balance-Leahy Scale: Zero Standing balance comment: max assist +2 static standing EOB                             Pertinent Vitals/Pain Pain Assessment: Faces Faces Pain Scale: No hurt    Home Living Family/patient expects to be discharged to:: Unsure                 Additional Comments: Pt with significant cognitive deficits & unable to communicate PLOF or home set up.    Prior Function           Comments: Pt  with significant cognitive deficits & unable to communicate PLOF or home set up.     Hand Dominance        Extremity/Trunk Assessment   Upper Extremity Assessment Upper Extremity Assessment: Generalized weakness;Difficult to assess due to impaired cognition    Lower Extremity Assessment Lower Extremity Assessment: Generalized weakness;Difficult to assess due to impaired cognition       Communication    Communication:  (Pt does not verbalize. Does not follow commands.)  Cognition Arousal/Alertness: Awake/alert Behavior During Therapy: Flat affect;Restless Overall Cognitive Status: No family/caregiver present to determine baseline cognitive functioning                                 General Comments: Pt unable to follow any commands, restless at times (reaching for therapists' arms, his own IV).      General Comments General comments (skin integrity, edema, etc.): Unable to obtain accurate BP reading as pt moving LUE throughout despite cuing/attempts to minimize movement.    Exercises     Assessment/Plan    PT Assessment Patient needs continued PT services  PT Problem List Decreased strength;Decreased coordination;Decreased range of motion;Decreased cognition;Decreased activity tolerance;Decreased knowledge of use of DME;Decreased balance;Decreased safety awareness;Decreased mobility;Decreased knowledge of precautions       PT Treatment Interventions DME instruction;Therapeutic exercise;Wheelchair mobility training;Gait training;Balance training;Manual techniques;Stair training;Neuromuscular re-education;Modalities;Functional mobility training;Cognitive remediation;Patient/family education;Therapeutic activities    PT Goals (Current goals can be found in the Care Plan section)  Acute Rehab PT Goals PT Goal Formulation: Patient unable to participate in goal setting    Frequency Min 2X/week   Barriers to discharge Decreased caregiver support;Inaccessible home environment      Co-evaluation PT/OT/SLP Co-Evaluation/Treatment: Yes Reason for Co-Treatment: Complexity of the patient's impairments (multi-system involvement);Necessary to address cognition/behavior during functional activity;For patient/therapist safety;To address functional/ADL transfers PT goals addressed during session: Mobility/safety with mobility;Balance;Strengthening/ROM         AM-PAC PT "6  Clicks" Mobility  Outcome Measure Help needed turning from your back to your side while in a flat bed without using bedrails?: Total Help needed moving from lying on your back to sitting on the side of a flat bed without using bedrails?: Total Help needed moving to and from a bed to a chair (including a wheelchair)?: Total Help needed standing up from a chair using your arms (e.g., wheelchair or bedside chair)?: Total Help needed to walk in hospital room?: Total Help needed climbing 3-5 steps with a railing? : Total 6 Click Score: 6    End of Session   Activity Tolerance: Patient tolerated treatment well Patient left: in bed;with call bell/phone within reach Nurse Communication: Mobility status PT Visit Diagnosis: Unsteadiness on feet (R26.81);Muscle weakness (generalized) (M62.81);Difficulty in walking, not elsewhere classified (R26.2);Other abnormalities of gait and mobility (R26.89)    Time: 3536-1443 PT Time Calculation (min) (ACUTE ONLY): 18 min   Charges:   PT Evaluation $PT Eval High Complexity: 1 High          Aleda Grana, PT, DPT 10/14/20, 3:13 PM   Sandi Mariscal 10/14/2020, 3:04 PM

## 2020-10-14 NOTE — ED Notes (Signed)
Pt convulsing at this time. This RN, second RN, and EDP at bedside. NRB applied. Convulsing last 30-45 seconds. VO 2mg  of Ativan given.

## 2020-10-14 NOTE — ED Notes (Signed)
Pt brother [name] updated by this RN via phone at this time.

## 2020-10-14 NOTE — Progress Notes (Addendum)
PROGRESS NOTE    Wyatt Ball [employer]  QQI:[id number] DOB: 09/11/1960 DOA: 10/14/2020 PCP: System, Provider Not In    Assessment & Plan:   Active Problems:   Stroke (cerebrum) (HCC)   Hypertensive urgency   Subacute right parietal cortical infarction: as per CT scan. NPO. Speech consulted. Continue w/ neuro checks. MRI brain shows possible PRES. Echo pending. Neuro consulted.   Possible seizure: EEG pending. No seizure meds as per home med rec. Continue on IV keppra. IV ativan prn.  Neuro consulted   HTN: w/ HTN urgency. Restart IV cardene drip as per neuro. Goal SBP <160  Peripheral neuropathy: continue on home dose of gabapentin when no longer NPO   DM2: likely poorly controlled. Continue on SSI w/ accuchecks   GERD: continue on PPI when no longer NPO  HLD: continue on statin when no longer NPO   Depression: severity unknown. 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 2022 & was in coma for about 10-11 days & was previously ambulating w/ crutches ineffectively as per pt's brother   DVT prophylaxis: SCDs Code Status: full  Family Communication: discussed pt's care w/ pt's brother, [name], and answered his questions  Disposition Plan: depends on PT/OT recs   Level of care: Stepdown  Status is: Inpatient  Remains inpatient appropriate because:Unsafe d/c plan, IV treatments appropriate due to intensity of illness or inability to take PO, and Inpatient level of care appropriate due to severity of illness  Dispo: The patient is from: Home              Anticipated d/c is to: SNF              Patient currently is not medically stable to d/c.   Difficult to place patient : unclear    Consultants:  Neuro   Procedures:   Antimicrobials:    Subjective: Pt is not talking currently  Objective: Vitals:   10/14/20 0700 10/14/20 0727 10/14/20 0730 10/14/20 0800  BP: (!) 198/116 (!) 192/104 (!) 203/95 (!) 208/98  Pulse:  73 80 76 81  Resp: 14 15 11 20   Temp:      TempSrc:      SpO2: 97% 96% 98% 100%  Weight:      Height:       No intake or output data in the 24 hours ending 10/14/20 0821 Filed Weights   10/14/20 0030  Weight: 63 kg    Examination:  General exam: Appears uncomfortable Respiratory system: diminished breath sounds b/l  Cardiovascular system: S1 & S2 +. No rubs, gallops or clicks.  Gastrointestinal system: Abdomen is nondistended, soft and nontender. Normal bowel sounds heard. Central nervous system: Lethargic. Follows simple commands intermittently  Psychiatry: Judgement and insight appear poor. Flat mood and affect    Data Reviewed: I have personally reviewed following labs and imaging studies  CBC: Recent Labs  Lab 10/14/20 0029  WBC 7.5  HGB 12.1*  HCT 36.3*  MCV 92.6  PLT 282   Basic Metabolic Panel: Recent Labs  Lab 10/14/20 0029  NA 136  K 4.4  CL 101  CO2 25  GLUCOSE 165*  BUN 21  CREATININE 1.54*  CALCIUM 9.6   GFR: Estimated Creatinine Clearance: 43.8 mL/min (A) (by C-G formula based on SCr of 1.54 mg/dL (H)). Liver Function Tests: Recent Labs  Lab 10/14/20 0029  AST 14*  ALT 10  ALKPHOS 82  BILITOT 0.6  PROT  7.0  ALBUMIN 3.8   No results for input(s): LIPASE, AMYLASE in the last 168 hours. No results for input(s): AMMONIA in the last 168 hours. Coagulation Profile: Recent Labs  Lab 10/14/20 0029  INR 1.0   Cardiac Enzymes: No results for input(s): CKTOTAL, CKMB, CKMBINDEX, TROPONINI in the last 168 hours. BNP (last 3 results) No results for input(s): PROBNP in the last 8760 hours. HbA1C: No results for input(s): HGBA1C in the last 72 hours. CBG: No results for input(s): GLUCAP in the last 168 hours. Lipid Profile: Recent Labs    10/14/20 0718  CHOL 125  HDL 33*  LDLCALC 67  TRIG 884  CHOLHDL 3.8   Thyroid Function Tests: No results for input(s): TSH, T4TOTAL, FREET4, T3FREE, THYROIDAB in the last 72 hours. Anemia  Panel: No results for input(s): VITAMINB12, FOLATE, FERRITIN, TIBC, IRON, RETICCTPCT in the last 72 hours. Sepsis Labs: No results for input(s): PROCALCITON, LATICACIDVEN in the last 168 hours.  Recent Results (from the past 240 hour(s))  Resp Panel by RT-PCR (Flu A&B, Covid) Nasopharyngeal Swab     Status: None   Collection Time: 10/14/20  1:48 AM   Specimen: Nasopharyngeal Swab; Nasopharyngeal(NP) swabs in vial transport medium  Result Value Ref Range Status   SARS Coronavirus 2 by RT PCR NEGATIVE NEGATIVE Final    Comment: (NOTE) SARS-CoV-2 target nucleic acids are NOT DETECTED.  The SARS-CoV-2 RNA is generally detectable in upper respiratory specimens during the acute phase of infection. The lowest concentration of SARS-CoV-2 viral copies this assay can detect is 138 copies/mL. A negative result does not preclude SARS-Cov-2 infection and should not be used as the sole basis for treatment or other patient management decisions. A negative result may occur with  improper specimen collection/handling, submission of specimen other than nasopharyngeal swab, presence of viral mutation(s) within the areas targeted by this assay, and inadequate number of viral copies(<138 copies/mL). A negative result must be combined with clinical observations, patient history, and epidemiological information. The expected result is Negative.  Fact Sheet for Patients:  BloggerCourse.com  Fact Sheet for Healthcare Providers:  SeriousBroker.it  This test is no t yet approved or cleared by the [place] FDA and  has been authorized for detection and/or diagnosis of SARS-CoV-2 by FDA under an Emergency Use Authorization (EUA). This EUA will remain  in effect (meaning this test can be used) for the duration of the COVID-19 declaration under Section 564(b)(1) of the Act, 21 U.S.C.section 360bbb-3(b)(1), unless the authorization is terminated  or  revoked sooner.       Influenza A by PCR NEGATIVE NEGATIVE Final   Influenza B by PCR NEGATIVE NEGATIVE Final    Comment: (NOTE) The Xpert Xpress SARS-CoV-2/FLU/RSV plus assay is intended as an aid in the diagnosis of influenza from Nasopharyngeal swab specimens and should not be used as a sole basis for treatment. Nasal washings and aspirates are unacceptable for Xpert Xpress SARS-CoV-2/FLU/RSV testing.  Fact Sheet for Patients: BloggerCourse.com  Fact Sheet for Healthcare Providers: SeriousBroker.it  This test is not yet approved or cleared by the [place] FDA and has been authorized for detection and/or diagnosis of SARS-CoV-2 by FDA under an Emergency Use Authorization (EUA). This EUA will remain in effect (meaning this test can be used) for the duration of the COVID-19 declaration under Section 564(b)(1) of the Act, 21 U.S.C. section 360bbb-3(b)(1), unless the authorization is terminated or revoked.  Performed at [facility], [address], [place], [place] [address]  Radiology Studies: CT HEAD WO CONTRAST ( )  Result Date: 10/14/2020 CLINICAL DATA:  Seizure, nontraumatic (Age >= 41y) EXAM: CT HEAD WITHOUT CONTRAST TECHNIQUE: Contiguous axial images were obtained from the base of the skull through the vertex without intravenous contrast. COMPARISON:  None. FINDINGS: Brain: Mild parenchymal volume loss is commensurate with the patient's age. Mild periventricular white matter changes are present likely reflecting the sequela of small vessel ischemia. Since the prior examination, a focus of low attenuation is seen within the para falcine right frontal lobe in keeping with a subacute cortical infarct. No associated abnormal mass effect or midline shift. No acute intracranial hemorrhage. Ventricular size is normal. The cerebellum is unremarkable. Vascular: No asymmetric hyperdense vasculature at the  skull base. Skull: Intact Sinuses/Orbits: The visualized paranasal sinuses are clear. The orbits are unremarkable. Other: Mastoid air cells and middle ear cavities are clear. IMPRESSION: Subacute right parafalcine parietal cortical infarct. No associated abnormal mass effect or acute intracranial hemorrhage. Stable senescent change Electronically Signed   By: Helyn Numbers M.D.   On: 10/14/2020 01:15   MR BRAIN WO CONTRAST  Result Date: 10/14/2020 CLINICAL DATA:  Stroke, follow up.  Seizure.  Hypertensive urgency. EXAM: MRI HEAD WITHOUT CONTRAST TECHNIQUE: Multiplanar, multiecho pulse sequences of the brain and surrounding structures were obtained without intravenous contrast. COMPARISON:  Head CT 10/14/2020 and MRI 06/07/2020 FINDINGS: Brain: There are extensive patchy areas of T2 hyperintensity compatible with edema throughout both cerebral and cerebellar hemispheres in a symmetric fashion. This is greatest in the subcortical white matter although the deep white matter and cortex are also involved. Involvement is greatest in the temporal-occipital regions and in the frontoparietal regions towards the vertex. There is also patchy T2 hyperintensity in the pons. There is mild involvement of the left thalamus. There is no restricted diffusion to indicate an acute infarct. No intracranial hemorrhage, midline shift, or extra-axial fluid collection is identified. There is mild cerebral atrophy. Vascular: Major intracranial vascular flow voids are preserved. Skull and upper cervical spine: Unremarkable bone marrow signal. Sinuses/Orbits: Unremarkable orbits. Clear paranasal sinuses. Small bilateral mastoid effusions. Other: None. IMPRESSION: 1. 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 2. No acute infarct. Electronically Signed   By: Sebastian Ache M.D.   On: 10/14/2020 06:10        Scheduled  Meds:   stroke: mapping our early stages of recovery book   Does not apply Once   amLODipine  10 mg Oral Daily   aspirin  81 mg Oral Daily   atorvastatin  40 mg Oral Daily   cloNIDine  0.3 mg Transdermal Weekly   clopidogrel  75 mg Oral Daily   DSS  200 mg Oral BID   enoxaparin (LOVENOX) injection  40 mg Subcutaneous Q24H   gabapentin  100 mg Oral BID WC   gabapentin  300 mg Oral QHS   haloperidol lactate       hydrALAZINE  20 mg Intravenous Once   insulin aspart  0-9 Units Subcutaneous TID AC & HS   insulin glargine-yfgn  20 Units Subcutaneous Daily   latanoprost  1 drop Both Eyes QHS   loratadine  10 mg Oral Daily   metoprolol tartrate  100 mg Oral BID   pantoprazole  40 mg Oral Daily   prazosin  4 mg Oral QHS   QUEtiapine  25 mg Oral BID   traZODone  150 mg Oral QHS   Continuous Infusions:  sodium chloride 100 mL/hr at 10/14/20 0412   levETIRAcetam     niCARDipine Stopped (10/14/20 0407)     LOS: 0 days    Time spent: 30 mins     Charise Killian, MD [facility] Pager [phone]  If 7PM-7AM, please contact night-coverage 10/14/2020, 8:21 AM

## 2020-10-14 NOTE — H&P (Signed)
[place]   PATIENT NAME: Wyatt Ball    MR#:  [id number]  DATE OF BIRTH:  02/20/1960  DATE OF ADMISSION:  10/14/2020  PRIMARY CARE PHYSICIAN: System, Provider Not In   Patient is coming from: Home  REQUESTING/REFERRING PHYSICIAN: Princella Ion, MD  CHIEF COMPLAINT:   Chief Complaint  Patient presents with  . Seizures    HISTORY OF PRESENT ILLNESS:  Wyatt Ball. is a 60 y.o. male with medical history significant for type 2 diabetes mellitus, hypertension, and anxiety and transverse myelitis, who presented to the ER with acute onset of altered mental status.  The patient was reportedly witnessed to have a seizure episode by his roommate per EMS.  When EMS found him he was awake but confused.  His roommate could not be contacted.  While the patient was in the ER he had a seizure episode with significantly elevated blood pressure that was up to 189/114 and later 250/160.   ED Course: Upon presentation to the emergency room, blood pressure was 188/100 and later 199/93 and later 250/160 with otherwise normal vital signs.  Labs revealed a BUN of 21 with a creatinine of 1.54.  CBC showed mild anemia.  Coagulation profile was within normal.  Alcohol level was less than 10.  Respiratory panel is currently pending.   EKG as reviewed by me : EKG showed normal sinus rhythm with a rate of 84 with left atrial enlargement Imaging: Noncontrasted CT scan revealed subacute right Parietal cortical infarct with no abnormal mass or hemorrhage.  The patient was given 300 mg rectal aspirin and 2 mg of IV Haldol and 4 mg of IV Zofran.  Given hypertensive urgency during his seizure he was started on IV Cardene drip and was given 2 mg of IV Ativan for seizure and loaded with IV Keppra.  He will be admitted to a stepdown unit bed for further evaluation and management.  PAST MEDICAL HISTORY:   Past Medical History:  Diagnosis Date  . Anxiety   . DM (diabetes mellitus) (HCC)    . HTN (hypertension)   . Transverse myelitis (HCC)     PAST SURGICAL HISTORY:   Past Surgical History:  Procedure Laterality Date  . PEG PLACEMENT N/A 07/03/2020   Procedure: PERCUTANEOUS ENDOSCOPIC GASTROSTOMY (PEG) PLACEMENT;  Surgeon: [name], MD;  Location: [facility] ENDOSCOPY;  Service: Gastroenterology;  Laterality: N/A;  . TONSILLECTOMY    . TOTAL HIP ARTHROPLASTY      SOCIAL HISTORY:   Social History   Tobacco Use  . Smoking status: Every Day  . Smokeless tobacco: Never  Substance Use Topics  . Alcohol use: Never    FAMILY HISTORY:   Family History  Problem Relation Age of Onset  . CAD Mother   . Diabetes Mother   . CAD Father   . Diabetes Father   . CAD Brother   . Diabetes Brother     DRUG ALLERGIES:  No Known Allergies  REVIEW OF SYSTEMS:   ROS As per history of present illness. All pertinent systems were reviewed above. Constitutional, HEENT, cardiovascular, respiratory, GI, GU, musculoskeletal, neuro, psychiatric, endocrine, integumentary and hematologic systems were reviewed and are otherwise negative/unremarkable except for positive findings mentioned above in the HPI.   MEDICATIONS AT HOME:   Prior to Admission medications   Medication Sig Start Date End Date Taking? Authorizing Provider  amLODipine (NORVASC) 10 MG tablet Take 1 tablet (10 mg total) by mouth daily. 08/02/20   Sreenath,  Sudheer B, MD  aspirin 81 MG chewable tablet Chew 81 mg by mouth daily.    [provider]  cloNIDine (CATAPRES - DOSED IN MG/24 HR) 0.3 mg/24hr patch Place 1 patch (0.3 mg total) onto the skin once a week. 08/02/20   Tresa Moore, MD  cyclobenzaprine (FLEXERIL) 10 MG tablet Take 10 mg by mouth 3 (three) times daily as needed for muscle spasms.    [provider]  Docusate Sodium (DSS) 100 MG CAPS Take 200 mg by mouth 2 (two) times daily.    [provider]  gabapentin (NEURONTIN) 100 MG capsule Take 1 capsule (100 mg total) by  mouth 2 (two) times daily with breakfast and lunch. 08/01/20   Lolita Patella B, MD  gabapentin (NEURONTIN) 300 MG capsule Take 1 capsule (300 mg total) by mouth at bedtime. 08/01/20   Tresa Moore, MD  hydrOXYzine (ATARAX/VISTARIL) 50 MG tablet Take 50 mg by mouth every 6 (six) hours as needed for anxiety.    [provider]  insulin glargine (LANTUS) 100 UNIT/ML injection Inject 0.2 mLs (20 Units total) into the skin daily. 08/02/20   Sreenath, Sudheer B, MD  latanoprost (XALATAN) 0.005 % ophthalmic solution Place 1 drop into both eyes at bedtime.    [provider]  loratadine (CLARITIN) 10 MG tablet Take 10 mg by mouth daily.    [provider]  metoprolol tartrate (LOPRESSOR) 100 MG tablet Take 1 tablet (100 mg total) by mouth 2 (two) times daily. 08/01/20   Tresa Moore, MD  pantoprazole (PROTONIX) 40 MG tablet Take 1 tablet (40 mg total) by mouth daily. 08/02/20   Tresa Moore, MD  prazosin (MINIPRESS) 1 MG capsule Take 1 mg by mouth 2 (two) times daily as needed (PTSD symptoms).    [provider]  prazosin (MINIPRESS) 2 MG capsule Take 4 mg by mouth at bedtime.    [provider]  promethazine (PHENERGAN) 25 MG tablet Take 25 mg by mouth 3 (three) times daily as needed for nausea.    [provider]  QUEtiapine (SEROQUEL) 25 MG tablet Take 1 tablet (25 mg total) by mouth 2 (two) times daily. 08/01/20   Tresa Moore, MD  simvastatin (ZOCOR) 80 MG tablet Take 80 mg by mouth every evening.    [provider]  traZODone (DESYREL) 150 MG tablet Take 150 mg by mouth at bedtime.    [provider]      VITAL SIGNS:  Blood pressure (!) 178/83, pulse 83, temperature 98.1 F (36.7 C), temperature source Oral, resp. rate 17, height 5\' 5"  (1.651 m), weight 63 kg, SpO2 95 %.  PHYSICAL EXAMINATION:  Physical Exam  GENERAL:  60 y.o.-year-old Caucasian male patient lying in the bed with no acute  distress.  He is alert and oriented only to his name and was otherwise fairly confused during my interview. EYES: Pupils equal, round, reactive to light and accommodation. No scleral icterus. Extraocular muscles intact.  HEENT: Head atraumatic, normocephalic. Oropharynx and nasopharynx clear.  NECK:  Supple, no jugular venous distention. No thyroid enlargement, no tenderness.  LUNGS: Normal breath sounds bilaterally, no wheezing, rales,rhonchi or crepitation. No use of accessory muscles of respiration.  CARDIOVASCULAR: Regular rate and rhythm, S1, S2 normal. No murmurs, rubs, or gallops.  ABDOMEN: Soft, nondistended, nontender. Bowel sounds present. No organomegaly or mass.  EXTREMITIES: No pedal edema, cyanosis, or clubbing.  NEUROLOGIC: Cranial nerves II through XII are intact. Muscle strength 5/5 in all  extremities. Sensation intact. Gait not checked.  PSYCHIATRIC: The patient is alert and oriented x 1.  No good eye contact. SKIN: No obvious rash, lesion, or ulcer.   LABORATORY PANEL:   CBC Recent Labs  Lab 10/14/20 0029  WBC 7.5  HGB 12.1*  HCT 36.3*  PLT 282   ------------------------------------------------------------------------------------------------------------------  Chemistries  Recent Labs  Lab 10/14/20 0029  NA 136  K 4.4  CL 101  CO2 25  GLUCOSE 165*  BUN 21  CREATININE 1.54*  CALCIUM 9.6  AST 14*  ALT 10  ALKPHOS 82  BILITOT 0.6   ------------------------------------------------------------------------------------------------------------------  Cardiac Enzymes No results for input(s): TROPONINI in the last 168 hours. ------------------------------------------------------------------------------------------------------------------  RADIOLOGY:  CT HEAD WO CONTRAST ( )  Result Date: 10/14/2020 CLINICAL DATA:  Seizure, nontraumatic (Age >= 41y) EXAM: CT HEAD WITHOUT CONTRAST TECHNIQUE: Contiguous axial images were obtained from the base of the skull  through the vertex without intravenous contrast. COMPARISON:  None. FINDINGS: Brain: Mild parenchymal volume loss is commensurate with the patient's age. Mild periventricular white matter changes are present likely reflecting the sequela of small vessel ischemia. Since the prior examination, a focus of low attenuation is seen within the para falcine right frontal lobe in keeping with a subacute cortical infarct. No associated abnormal mass effect or midline shift. No acute intracranial hemorrhage. Ventricular size is normal. The cerebellum is unremarkable. Vascular: No asymmetric hyperdense vasculature at the skull base. Skull: Intact Sinuses/Orbits: The visualized paranasal sinuses are clear. The orbits are unremarkable. Other: Mastoid air cells and middle ear cavities are clear. IMPRESSION: Subacute right parafalcine parietal cortical infarct. No associated abnormal mass effect or acute intracranial hemorrhage. Stable senescent change Electronically Signed   By: Helyn Numbers M.D.   On: 10/14/2020 01:15      IMPRESSION AND PLAN:  Active Problems:   Stroke (cerebrum) (HCC)  1.  Subacute right parietal cortical infarction with altered mental status/acute encephalopathy likely postictal with associated seizures. - The patient will be admitted to a stepdown unit bed. - We will follow neurochecks every 4 hours for 24 hours. - We will obtain a brain MRI without contrast as well as bilateral carotid Doppler. - We will place the patient on aspirin and Plavix. - PT/OT and ST consults will be obtained. - Neurology consult to be obtained. - I notified Dr. Wilford Corner about the patient.  2.  Essential hypertension with hypertensive urgency. - We will continue amlodipine, Lopressor and Minipress  when able to take p.o. - We will continue Catapres patch. - The patient will be placed on IV Cardene drip.  3.  Peripheral neuropathy. - We will continue Neurontin.  4.  Type 2 diabetes mellitus without  complications. - The patient will be placed on supplement coverage with NovoLog.  5.  GERD. - We will continue PPI therapy.  6.  Dyslipidemia. - We will continue statin therapy.  7.  Depression. - We will continue Seroquel.   DVT prophylaxis: Lovenox.  Code Status: The patient's DNR/DNI Family Communication:  The plan of care was discussed in details with the patient (and family). I answered all questions. The patient agreed to proceed with the above mentioned plan. Further management will depend upon hospital course. Disposition Plan: Back to previous home environment Consults called: Neurology. All the records are reviewed and case discussed with ED provider.  Status is: Inpatient  Remains inpatient appropriate because:Altered mental status, Ongoing diagnostic testing needed not appropriate for outpatient work up, Unsafe d/c plan, IV treatments appropriate due  to intensity of illness or inability to take PO, and Inpatient level of care appropriate due to severity of illness  Dispo: The patient is from: Home              Anticipated d/c is to: Home              Patient currently is not medically stable to d/c.               Difficult to place patient No  Authorized and performed by: Valente David, MD Total critical care time: Approximately   55    minutes. Due to a high probability of clinically significant, life-threatening deterioration, the patient required my highest level of preparedness to intervene emergently and I personally spent this critical care time directly and personally managing the patient.  This critical care time included obtaining a history, examining the patient, pulse oximetry, ordering and review of studies, arranging urgent treatment with development of management plan, evaluation of patient's response to treatment, frequent reassessment, and discussions with other providers. This critical care time was performed to assess and manage the high probability of  imminent, life-threatening deterioration that could result in multiorgan failure.  It was exclusive of separately billable procedures and treating other patients and teaching time.  Please see MDM section and the rest of the note for further information on patient assessment and treatment.     Hannah Beat M.D on 10/14/2020 at 2:16 AM  [facility]   From 7 PM-7 AM, contact night-coverage [email or web address]  CC: Primary care physician; System, Provider Not In

## 2020-10-14 NOTE — Progress Notes (Signed)
Eeg done 

## 2020-10-14 NOTE — ED Notes (Signed)
Pt currently post ictal with NRB still on. Keppra and IVF currently infusing.

## 2020-10-14 NOTE — ED Provider Notes (Signed)
[facility] Emergency Department Provider Note  ____________________________________________  Time seen: Approximately 1:34 AM  I have reviewed the triage vital signs and the nursing notes.   HISTORY  Chief Complaint Seizures  Level 5 caveat:  Portions of the history and physical were unable to be obtained due to AMS   HPI Wyatt Ball. is a 60 y.o. male with a history of DM, hypertension, transverse myelitis who presents for evaluation of altered mental status.  Patient comes from home and EMS reports the patient apparently had a seizure that was witnessed by roommates.  EMS did not have any more details about patient's presentation.  Reports that when they found patient at home he was awake but confused.  Unable to reach patient's roommate.  Unknown what patient's baseline is.  Patient is complaining of a headache.  He tells me that he drinks alcohol on a daily basis.  He is very confused and intermittently following commands   Past Medical History:  Diagnosis Date   Anxiety    DM (diabetes mellitus) (HCC)    HTN (hypertension)    Transverse myelitis (HCC)     Patient Active Problem List   Diagnosis Date Noted   Malnutrition of moderate degree 07/10/2020   AKI (acute kidney injury) (HCC) 06/26/2020   Pressure injury of skin 06/25/2020   Altered mental status    Urinary retention    Acute metabolic encephalopathy 06/04/2020   Severe sepsis with septic shock (HCC) 06/04/2020   CAP (community acquired pneumonia) 06/04/2020   Neurogenic bladder 06/04/2020   Chronic, continuous use of opioids 06/04/2020   Chronic low back pain 06/04/2020   Type 2 diabetes mellitus without complication (HCC) 06/04/2020   Migraines 06/04/2020   Recurrent UTI 06/04/2020   History of colon polyps 06/04/2020    Past Surgical History:  Procedure Laterality Date   PEG PLACEMENT N/A 07/03/2020   Procedure: PERCUTANEOUS ENDOSCOPIC GASTROSTOMY (PEG) PLACEMENT;   Surgeon: [name], MD;  Location: [facility] ENDOSCOPY;  Service: Gastroenterology;  Laterality: N/A;   TONSILLECTOMY     TOTAL HIP ARTHROPLASTY      Prior to Admission medications   Medication Sig Start Date End Date Taking? Authorizing Provider  amLODipine (NORVASC) 10 MG tablet Take 1 tablet (10 mg total) by mouth daily. 08/02/20   Tresa Moore, MD  aspirin 81 MG chewable tablet Chew 81 mg by mouth daily.    [provider]  cloNIDine (CATAPRES - DOSED IN MG/24 HR) 0.3 mg/24hr patch Place 1 patch (0.3 mg total) onto the skin once a week. 08/02/20   Tresa Moore, MD  cyclobenzaprine (FLEXERIL) 10 MG tablet Take 10 mg by mouth 3 (three) times daily as needed for muscle spasms.    [provider]  Docusate Sodium (DSS) 100 MG CAPS Take 200 mg by mouth 2 (two) times daily.    [provider]  gabapentin (NEURONTIN) 100 MG capsule Take 1 capsule (100 mg total) by mouth 2 (two) times daily with breakfast and lunch. 08/01/20   Lolita Patella B, MD  gabapentin (NEURONTIN) 300 MG capsule Take 1 capsule (300 mg total) by mouth at bedtime. 08/01/20   Tresa Moore, MD  hydrOXYzine (ATARAX/VISTARIL) 50 MG tablet Take 50 mg by mouth every 6 (six) hours as needed for anxiety.    [provider]  insulin glargine (LANTUS) 100 UNIT/ML injection Inject 0.2 mLs (20 Units total) into the skin daily. 08/02/20   Tresa Moore, MD  latanoprost (  XALATAN) 0.005 % ophthalmic solution Place 1 drop into both eyes at bedtime.    [provider]  loratadine (CLARITIN) 10 MG tablet Take 10 mg by mouth daily.    [provider]  metoprolol tartrate (LOPRESSOR) 100 MG tablet Take 1 tablet (100 mg total) by mouth 2 (two) times daily. 08/01/20   Tresa Moore, MD  pantoprazole (PROTONIX) 40 MG tablet Take 1 tablet (40 mg total) by mouth daily. 08/02/20   Tresa Moore, MD  prazosin (MINIPRESS) 1 MG capsule Take 1 mg by mouth 2 (two) times  daily as needed (PTSD symptoms).    [provider]  prazosin (MINIPRESS) 2 MG capsule Take 4 mg by mouth at bedtime.    [provider]  promethazine (PHENERGAN) 25 MG tablet Take 25 mg by mouth 3 (three) times daily as needed for nausea.    [provider]  QUEtiapine (SEROQUEL) 25 MG tablet Take 1 tablet (25 mg total) by mouth 2 (two) times daily. 08/01/20   Tresa Moore, MD  simvastatin (ZOCOR) 80 MG tablet Take 80 mg by mouth every evening.    [provider]  traZODone (DESYREL) 150 MG tablet Take 150 mg by mouth at bedtime.    [provider]    Allergies Patient has no known allergies.  Family History  Problem Relation Age of Onset   CAD Mother    Diabetes Mother    CAD Father    Diabetes Father    CAD Brother    Diabetes Brother     Social History Social History   Tobacco Use   Smoking status: Every Day   Smokeless tobacco: Never  Substance Use Topics   Alcohol use: Never    Review of Systems  Constitutional: Negative for fever. + AMS Cardiovascular: Negative for chest pain. Respiratory: Negative for shortness of breath. Gastrointestinal: Negative for abdominal pain, vomiting or diarrhea. Neurological: + headaches   ____________________________________________   PHYSICAL EXAM:  VITAL SIGNS: ED Triage Vitals  Enc Vitals Group     BP 10/14/20 0025 (!) 188/100     Pulse Rate 10/14/20 0025 78     Resp 10/14/20 0025 11     Temp 10/14/20 0030 98.1 F (36.7 C)     Temp Source 10/14/20 0030 Oral     SpO2 10/14/20 0025 99 %     Weight 10/14/20 0030 138 lb 14.2 oz (63 kg)     Height 10/14/20 0030 5\' 5"  (1.651 m)     Head Circumference --      Peak Flow --      Pain Score --      Pain Loc --      Pain Edu? --      Excl. in GC? --     Constitutional: Alert and oriented to self only, no distress.  HEENT:      Head: Normocephalic and atraumatic.         Eyes: Conjunctivae are normal. Sclera is  non-icteric.       Mouth/Throat: Mucous membranes are moist.       Neck: Supple with no signs of meningismus. Cardiovascular: Regular rate and rhythm. No murmurs, gallops, or rubs. 2+ symmetrical distal pulses are present in all extremities. No JVD. Respiratory: Normal respiratory effort. Lungs are clear to auscultation bilaterally.  Gastrointestinal: Soft, non tender. Musculoskeletal:  No edema, cyanosis, or erythema of extremities. Neurologic: Confused, patient told me his name is Jaiyden but then every question I ask  after that he answered as Gaspar Garbe.  Having difficulty following simple commands such as squeezing my hand, pushing or pulling me from him.  He is moving all extremities and his face is symmetric Skin: Skin is warm, dry and intact. No rash noted. Psychiatric: Mood and affect are normal. Speech and behavior are normal.  ____________________________________________   LABS (all labs ordered are listed, but only abnormal results are displayed)  Labs Reviewed  CBC - Abnormal; Notable for the following components:      Result Value   RBC 3.92 (*)    Hemoglobin 12.1 (*)    HCT 36.3 (*)    All other components within normal limits  COMPREHENSIVE METABOLIC PANEL - Abnormal; Notable for the following components:   Glucose, Bld 165 (*)    Creatinine, Ser 1.54 (*)    AST 14 (*)    GFR, Estimated 51 (*)    All other components within normal limits  RESP PANEL BY RT-PCR (FLU A&B, COVID) ARPGX2  ETHANOL  URINE DRUG SCREEN, QUALITATIVE ([facility] ONLY)  PROTIME-INR  APTT  URINALYSIS, ROUTINE W REFLEX MICROSCOPIC   ____________________________________________  EKG  ED ECG REPORT I, Nita Sickle, the attending physician, personally viewed and interpreted this ECG.  Sinus rhythm with a rate of 84, normal intervals, normal axis, no ST elevations or depressions. ____________________________________________  RADIOLOGY  I have personally reviewed the images performed during this  visit and I agree with the Radiologist's read.   Interpretation by Radiologist:  CT HEAD WO CONTRAST ( )  Result Date: 10/14/2020 CLINICAL DATA:  Seizure, nontraumatic (Age >= 41y) EXAM: CT HEAD WITHOUT CONTRAST TECHNIQUE: Contiguous axial images were obtained from the base of the skull through the vertex without intravenous contrast. COMPARISON:  None. FINDINGS: Brain: Mild parenchymal volume loss is commensurate with the patient's age. Mild periventricular white matter changes are present likely reflecting the sequela of small vessel ischemia. Since the prior examination, a focus of low attenuation is seen within the para falcine right frontal lobe in keeping with a subacute cortical infarct. No associated abnormal mass effect or midline shift. No acute intracranial hemorrhage. Ventricular size is normal. The cerebellum is unremarkable. Vascular: No asymmetric hyperdense vasculature at the skull base. Skull: Intact Sinuses/Orbits: The visualized paranasal sinuses are clear. The orbits are unremarkable. Other: Mastoid air cells and middle ear cavities are clear. IMPRESSION: Subacute right parafalcine parietal cortical infarct. No associated abnormal mass effect or acute intracranial hemorrhage. Stable senescent change Electronically Signed   By: Helyn Numbers M.D.   On: 10/14/2020 01:15     ____________________________________________   PROCEDURES  Procedure(s) performed:yes .1-3 Lead EKG Interpretation Performed by: Nita Sickle, MD Authorized by: Nita Sickle, MD     Interpretation: non-specific     ECG rate assessment: normal     Rhythm: sinus rhythm     Ectopy: none     Conduction: normal     Critical Care performed:  None ____________________________________________   INITIAL IMPRESSION / ASSESSMENT AND PLAN / ED COURSE   60 y.o. male with a history of DM, hypertension, transverse myelitis who presents for evaluation of altered mental status.  Patient is confused,  GCS 12, having difficulty following basic commands but he is moving all extremities and face is symmetric.  EKG with no signs of ischemia or dysrhythmias.  Head CT done to rule out acute intracranial hemorrhage and showed a subacute right parietal cortical infarct but no signs of hemorrhage.  Patient's BP is elevated, we will not treat at  this time to allow for permissive hypertension.  Will consult the hospitalist for admission.  Labs show no significant electrolyte derangements, signs of sepsis, alcohol is negative.  Old medical records reviewed from patient's admission to the hospital 4 months ago.      _____________________________________________ Please note:  Patient was evaluated in Emergency Department today for the symptoms described in the history of present illness. Patient was evaluated in the context of the global COVID-19 pandemic, which necessitated consideration that the patient might be at risk for infection with the SARS-CoV-2 virus that causes COVID-19. Institutional protocols and algorithms that pertain to the evaluation of patients at risk for COVID-19 are in a state of rapid change based on information released by regulatory bodies including the CDC and federal and state organizations. These policies and algorithms were followed during the patient's care in the ED.  Some ED evaluations and interventions may be delayed as a result of limited staffing during the pandemic.   [place] Controlled Substance Database was reviewed by me. ____________________________________________   FINAL CLINICAL IMPRESSION(S) / ED DIAGNOSES   Final diagnoses:  Cerebrovascular accident (CVA), unspecified mechanism (HCC)      NEW MEDICATIONS STARTED DURING THIS VISIT:  ED Discharge Orders     None        Note:  This document was prepared using Dragon voice recognition software and may include unintentional dictation errors.    Nita Sickle, MD 10/14/20 [phone]

## 2020-10-14 NOTE — Progress Notes (Signed)
Occupational Therapy Evaluation Patient Details Name: Wyatt Ball. MRN: [id number] DOB: [birth year] Today's Date: 10/14/2020   History of Present Illness Pt is a 60 y/o M admitted on 10/14/20 with c/c of AMS & seizures. While in the ED pt had a seizure episode with significantly elevated BP. Pt found to have subacute R parietal corical infarction with AMS/acute encephalopathy likely postictal with associated seizures. PMH: Dm2, HTN, anxiety, transverse myelitis   Clinical Impression   Mr Kleinman was seen for OT/PT co-evaluation this date. Pt unable to follow commands or answer PLOF/home setup - per MD in room, pt's brother reports AD use for limited mobility. Pt presents to acute OT demonstrating impaired ADL performance and functional mobility 2/2 functional strength/ROM/balance deficits, poor command following, and decreased insight into deficits. Pt currently requires MAX A x2 don B socks seated EOB. MAX A x2 + HHA for ADL t/f. Max-Mod L lateral lean in sitting. Pt would benefit from skilled OT to address noted impairments and functional limitations (see below for any additional details) in order to maximize safety and independence while minimizing falls risk and caregiver burden. Upon hospital discharge, recommend STR to maximize pt safety and return to PLOF.       Recommendations for follow up therapy are one component of a multi-disciplinary discharge planning process, led by the attending physician.  Recommendations may be updated based on patient status, additional functional criteria and insurance authorization.   Follow Up Recommendations  SNF;Supervision/Assistance - 24 hour    Equipment Recommendations  Other (comment) (defer to next venue of care)    Recommendations for Other Services       Precautions / Restrictions Precautions Precautions: Fall Restrictions Weight Bearing Restrictions: No      Mobility Bed Mobility Overal bed mobility: Needs  Assistance Bed Mobility: Supine to Sit;Sit to Supine     Supine to sit: Total assist;+2 for physical assistance;+2 for safety/equipment Sit to supine: Total assist;+2 for physical assistance;+2 for safety/equipment        Transfers Overall transfer level: Needs assistance   Transfers: Sit to/from Stand Sit to Stand: +2 physical assistance;+2 safety/equipment;Max assist         General transfer comment: Pt performs multiple sit<>stands (x4) throughout session with MAX assist +2 with HHA & therapists blocking BLE knees to prevent buckling & assistance for upright posture and to shift pelvis anteriorly.    Balance Overall balance assessment: Needs assistance Sitting-balance support: Feet supported;Bilateral upper extremity supported Sitting balance-Leahy Scale: Zero Sitting balance - Comments: significant L lateral lean with poor ability to correct Postural control: Left lateral lean Standing balance support: Bilateral upper extremity supported;During functional activity Standing balance-Leahy Scale: Zero Standing balance comment: max assist +2 static standing EOB                           ADL either performed or assessed with clinical judgement   ADL Overall ADL's : Needs assistance/impaired                                       General ADL Comments: MAX A x2 don B socks seated EOB. MAX A x2 + HHA for ADL t/f. Poor command following for ADL trials      Pertinent Vitals/Pain Pain Assessment: Faces Faces Pain Scale: No hurt     Hand Dominance     Extremity/Trunk Assessment Upper  Extremity Assessment Upper Extremity Assessment: Generalized weakness;Difficult to assess due to impaired cognition   Lower Extremity Assessment Lower Extremity Assessment: Generalized weakness;Difficult to assess due to impaired cognition       Communication Communication Communication: Other (comment) (Pt does not verbalize. Does not follow commands.)    Cognition Arousal/Alertness: Awake/alert Behavior During Therapy: Flat affect;Restless Overall Cognitive Status: No family/caregiver present to determine baseline cognitive functioning                                 General Comments: Pt unable to follow any commands, restless at times (reaching for therapists' arms, his own IV).   General Comments  pt unable to keep arm still for BP accurate reading    Exercises Exercises: Other exercises Other Exercises Other Exercises: Pt educated re: orientation Other Exercises: LBD, sup<>sit, sit<>stand, sitting/standing balance/tolerance   Shoulder Instructions      Home Living Family/patient expects to be discharged to:: Unsure                                 Additional Comments: Pt with significant cognitive deficits & unable to communicate PLOF or home set up.      Prior Functioning/Environment Level of Independence: Needs assistance        Comments: Per chart, pt requiring AD for limited mobility        OT Problem List: Decreased strength;Decreased range of motion;Decreased activity tolerance;Impaired balance (sitting and/or standing);Decreased coordination;Decreased cognition;Decreased safety awareness;Decreased knowledge of precautions;Impaired UE functional use      OT Treatment/Interventions: Self-care/ADL training;Therapeutic exercise;Energy conservation;DME and/or AE instruction;Therapeutic activities;Patient/family education;Balance training    OT Goals(Current goals can be found in the care plan section) Acute Rehab OT Goals Patient Stated Goal: unable to state OT Goal Formulation: Patient unable to participate in goal setting Time For Goal Achievement: 10/28/20 Potential to Achieve Goals: Fair ADL Goals Pt Will Perform Eating: with supervision;bed level Pt Will Perform Grooming: with min assist;sitting Pt Will Transfer to Toilet: with max assist;stand pivot transfer;bedside commode (c  LRAD PRN)  OT Frequency: Min 1X/week   Barriers to D/C: Decreased caregiver support          Co-evaluation PT/OT/SLP Co-Evaluation/Treatment: Yes Reason for Co-Treatment: Complexity of the patient's impairments (multi-system involvement);Necessary to address cognition/behavior during functional activity;For patient/therapist safety;To address functional/ADL transfers PT goals addressed during session: Mobility/safety with mobility OT goals addressed during session: ADL's and self-care      AM-PAC OT "6 Clicks" Daily Activity     Outcome Measure Help from another person eating meals?: A Lot Help from another person taking care of personal grooming?: A Lot Help from another person toileting, which includes using toliet, bedpan, or urinal?: A Lot Help from another person bathing (including washing, rinsing, drying)?: A Lot Help from another person to put on and taking off regular upper body clothing?: A Lot Help from another person to put on and taking off regular lower body clothing?: A Lot 6 Click Score: 12   End of Session Nurse Communication: Mobility status  Activity Tolerance: Other (comment) (Pt limited by cognition) Patient left: in bed;with call bell/phone within reach  OT Visit Diagnosis: Other abnormalities of gait and mobility (R26.89);Cognitive communication deficit (R41.841) Symptoms and signs involving cognitive functions: Other cerebrovascular disease                Time: [phone]  OT Time Calculation (min): 18 min Charges:  OT General Charges $OT Visit: 1 Visit OT Evaluation $OT Eval Moderate Complexity: 1 Mod  Kathie Dike, M.S. OTR/L  10/14/20, 3:36 PM  ascom [phone]

## 2020-10-14 NOTE — Consult Note (Signed)
Neurology Consultation Reason for Consult: Altered MEntal Status Referring Physician: Wilfred Ball  CC: Altered Mental Status  History is obtained from:Chart  HPI: Wyatt Gautreau Montez Hageman. is a 60 y.o. male who presented with seizure last night.  He was complaining of headache for three days then he fell.  His brother found him, and initially he was speaking, but then shortly thereafter, he he was found seizing.  His brother estimates that he thrashed about for 30 to 45 seconds before being severely encephalopathic.  He started to "come to" still was not speaking normally after about 30 minutes.  He was brought into the emergency department  He has never had a seizure before.  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  LKW: 3 days ago tpa given?: no, outside window   ROS: A 14 point ROS was performed and is negative except as noted in the HPI.   Past Medical History:  Diagnosis Date   Anxiety    DM (diabetes mellitus) (HCC)    HTN (hypertension)    Transverse myelitis (HCC)      Family History  Problem Relation Age of Onset   CAD Mother    Diabetes Mother    CAD Father    Diabetes Father    CAD Brother    Diabetes Brother      Social History:  reports that he has been smoking. He has never used smokeless tobacco. He reports that he does not drink alcohol. No history on file for drug use.   Exam: Current vital signs: BP (!) 188/87   Pulse 93   Temp (!) 100.9 F (38.3 C) (Rectal)   Resp 14   Ht 5\' 5"  (1.651 m)   Wt 63 kg   SpO2 97%   BMI 23.11 kg/m  Vital signs in last 24 hours: Temp:  [98.1 F (36.7 C)-100.9 F (38.3 C)] 100.9 F (38.3 C) (10/10 1211) Pulse Rate:  [27-115] 93 (10/10 1200) Resp:  [11-27] 14 (10/10 1040) BP: (139-262)/(83-205) 188/87 (10/10 1200) SpO2:  [72 %-100 %] 97 % (10/10 1200) Weight:  [63 kg] 63 kg (10/10 0030)   Physical Exam  Constitutional: Appears well-developed and well-nourished.  Psych: Affect  appropriate to situation Eyes: No scleral injection HENT: No OP obstruction, neck is supple MSK: no joint deformities.  Cardiovascular: Normal rate and regular rhythm.  Respiratory: Effort normal, non-labored breathing GI: Soft.  No distension. There is no tenderness.  Skin: WDI  Neuro: Mental Status: Patient is awake, alert, he does look at the examiner single time, but does not engage consistently.  He does not follow commands or speak. Cranial Nerves: II: He does fixate the examiner, but does not blink to threat reliably from either direction. Pupils are equal, round, and reactive to light.   III,IV, VI: EOMI without ptosis or diploplia.  V: VII: He blinks to eyelid stimulation bilaterally Motor: He moves all extremities symmetrically, no clear focal weakness, he does appear to have reduced muscle bulk in bilateral lower extremities sensory: Response to noxious stimulation in all four extremities Deep Tendon Reflexes: 2+ and symmetric at the patella Cerebellar: Does not perform      I have reviewed labs in epic and the results pertinent to this consultation are: Creatinine 1.5  I have reviewed the images obtained: MRI brain-extensive confluent T2 change consistent with PR ES  Impression: 60 year old male with posterior reversible encephalopathy syndrome (PRES).  This is managed as hypertensive encephalopathy, and is essentially the imaging finding of that disorder.  With the multiple recurrent seizures, I would favor continuing antiepileptic therapy at this time, though he may not need lifelong antiepileptic therapy.  Stat EEG does not reveal ongoing seizure activity, and I suspect that his current mental status is more due to his PRES. the diffusion change seen on the MRI and read as a stroke could be T2 shine through versus ischemic conversion of his pres.  Recommendations: 1) aggressive BP control, with target systolic of 160 2) continue Keppra 500 mg twice daily 3) CT  angio head and neck 4) continue baby aspirin 5) neurology will continue to follow   Ritta Slot, MD [facility] [phone]  If 7pm- 7am, please page neurology on call as listed in [facility]ION.

## 2020-10-14 NOTE — Progress Notes (Signed)
Chart reviewed. Pt currently receiving EEG. Will reattempt swallow eval when available.

## 2020-10-14 NOTE — ED Notes (Signed)
Pt is responding to voice and moving extremities but having a very hard time following commands. will open eyes on command and squeeze rt hand when asked but not following commands with other extremities. However, Pt is moving all extremities independently. Unable to respond verbally. When opening eyes pt is moving both eyes through all fields but unable to follow RN finger when asked

## 2020-10-14 NOTE — ED Notes (Signed)
Pt responds to pain and some physical touch on extremities while drawing blood. Pt unable to respond verbally or follow commands at this time

## 2020-10-14 NOTE — ED Notes (Signed)
Resumed care from lorrie rn.  Pt confused.  Iv team at bedside.  Iv started.  Meds now infusing for blood pressure.   Nsr on monitor.  Seizure pads in place.

## 2020-10-14 NOTE — ED Notes (Signed)
Pt continues to move all extremities independently but is unable to verbally communicate or follow commands when prompted.

## 2020-10-14 NOTE — ED Notes (Signed)
Resumed care from lorrie rn.  Pt nonverbal.  Sinus on monitor at 100.  Iv meds infusing.   Pt waiting on admission.

## 2020-10-14 NOTE — ED Notes (Signed)
Patient with multiple episodes of projectile emesis. Bed change performed. Pt at 90 degrees for aspiration protection.

## 2020-10-14 NOTE — ED Notes (Signed)
Pt to MRI

## 2020-10-14 NOTE — ED Notes (Signed)
Patient taken to CT on monitor by [facility].

## 2020-10-14 NOTE — ED Triage Notes (Signed)
Pt BIBA from home for c/o seizure witnessed by roommate.  No hx of same in past. Per EMS pt is post ictal at this time. No interventions on scene.

## 2020-10-14 NOTE — ED Notes (Signed)
ED TO INPATIENT HANDOFF REPORT  ED Nurse Name and Phone #:   S Name/Age/Gender Wyatt Garbe Clodfelter Jr. 60 y.o. male Room/Bed: ED09A/ED09A  Code Status   Code Status: DNR  Home/SNF/Other Unknown at this point  Is this baseline?     Triage Complete: Triage complete  Chief Complaint Stroke (cerebrum) [facility]) [I63.9]  Triage Note Pt BIBA from home for c/o seizure witnessed by roommate.  No hx of same in past. Per EMS pt is post ictal at this time. No interventions on scene.       Allergies No Known Allergies  Level of Care/Admitting Diagnosis ED Disposition     ED Disposition  Admit   Condition  --   Comment  Hospital Area: [facility] [[id number]]  Level of Care: Med-Surg [16]  Covid Evaluation: Asymptomatic Screening Protocol (No Symptoms)  Diagnosis: [facility]) [[id number]]  Admitting Physician: Hannah Beat [[id number]]  Attending Physician: Hannah Beat [[id number]]  Estimated length of stay: past midnight tomorrow  Certification:: I certify this patient will need inpatient services for at least 2 midnights          B Medical/Surgery History Past Medical History:  Diagnosis Date   Anxiety    DM (diabetes mellitus) (HCC)    HTN (hypertension)    Transverse myelitis (HCC)    Past Surgical History:  Procedure Laterality Date   PEG PLACEMENT N/A 07/03/2020   Procedure: PERCUTANEOUS ENDOSCOPIC GASTROSTOMY (PEG) PLACEMENT;  Surgeon: [name], MD;  Location: [facility] ENDOSCOPY;  Service: Gastroenterology;  Laterality: N/A;   TONSILLECTOMY     TOTAL HIP ARTHROPLASTY       A IV Location/Drains/Wounds Patient Lines/Drains/Airways Status     Active Line/Drains/Airways     Name Placement date Placement time Site Days   Peripheral IV 10/14/20 20 G 1" Anterior;Left Hand 10/14/20  0057  Hand  less than 1   Incision (Closed) 07/03/20 Abdomen Left;Medial;Upper 07/03/20  1445  -- 103   Pressure Injury 06/23/20 Coccyx Posterior;Mid Stage  2 -  Partial thickness loss of dermis presenting as a shallow open injury with a red, pink wound bed without slough. 06/23/20  1400  -- 113            Intake/Output Last 24 hours No intake or output data in the 24 hours ending 10/14/20 0630  Labs/Imaging Results for orders placed or performed during the hospital encounter of 10/14/20 (from the past 48 hour(s))  CBC - if new onset seizures     Status: Abnormal   Collection Time: 10/14/20 12:29 AM  Result Value Ref Range   WBC 7.5 4.0 - 10.5 K/uL   RBC 3.92 (L) 4.22 - 5.81 MIL/uL   Hemoglobin 12.1 (L) 13.0 - 17.0 g/dL   HCT 16.0 (L) 10.9 - 32.3 %   MCV 92.6 80.0 - 100.0 fL   MCH 30.9 26.0 - 34.0 pg   MCHC 33.3 30.0 - 36.0 g/dL   RDW 55.7 32.2 - 02.5 %   Platelets 282 150 - 400 K/uL   nRBC 0.0 0.0 - 0.2 %    Comment: Performed at [facility], [address] [place], [place] [address]  Comprehensive metabolic panel     Status: Abnormal   Collection Time: 10/14/20 12:29 AM  Result Value Ref Range   Sodium 136 135 - 145 mmol/L   Potassium 4.4 3.5 - 5.1 mmol/L   Chloride 101 98 - 111 mmol/L   CO2 25 22 - 32 mmol/L  Glucose, Bld 165 (H) 70 - 99 mg/dL    Comment: Glucose reference range applies only to samples taken after fasting for at least 8 hours.   BUN 21 8 - 23 mg/dL   Creatinine, Ser 4.27 (H) 0.61 - 1.24 mg/dL   Calcium 9.6 8.9 - 06.2 mg/dL   Total Protein 7.0 6.5 - 8.1 g/dL   Albumin 3.8 3.5 - 5.0 g/dL   AST 14 (L) 15 - 41 U/L   ALT 10 0 - 44 U/L   Alkaline Phosphatase 82 38 - 126 U/L   Total Bilirubin 0.6 0.3 - 1.2 mg/dL   GFR, Estimated 51 (L) >60 mL/min    Comment: (NOTE) Calculated using the CKD-EPI Creatinine Equation (2021)    Anion gap 10 5 - 15    Comment: Performed at [facility], [address] [place], [place] [address]  Ethanol     Status: None   Collection Time: 10/14/20 12:29 AM  Result Value Ref Range   Alcohol, Ethyl (B) <10 <10 mg/dL    Comment: (NOTE) Lowest detectable  limit for serum alcohol is 10 mg/dL.  For medical purposes only. Performed at [facility], [address] [place], [place] [address]   Protime-INR     Status: None   Collection Time: 10/14/20 12:29 AM  Result Value Ref Range   Prothrombin Time 12.6 11.4 - 15.2 seconds   INR 1.0 0.8 - 1.2    Comment: (NOTE) INR goal varies based on device and disease states. Performed at [facility], [address] [place], [place] [address]   APTT     Status: None   Collection Time: 10/14/20 12:29 AM  Result Value Ref Range   aPTT 28 24 - 36 seconds    Comment: Performed at [facility], [address] [place], [place] [address]  Resp Panel by RT-PCR (Flu A&B, Covid) Nasopharyngeal Swab     Status: None   Collection Time: 10/14/20  1:48 AM   Specimen: Nasopharyngeal Swab; Nasopharyngeal(NP) swabs in vial transport medium  Result Value Ref Range   SARS Coronavirus 2 by RT PCR NEGATIVE NEGATIVE    Comment: (NOTE) SARS-CoV-2 target nucleic acids are NOT DETECTED.  The SARS-CoV-2 RNA is generally detectable in upper respiratory specimens during the acute phase of infection. The lowest concentration of SARS-CoV-2 viral copies this assay can detect is 138 copies/mL. A negative result does not preclude SARS-Cov-2 infection and should not be used as the sole basis for treatment or other patient management decisions. A negative result may occur with  improper specimen collection/handling, submission of specimen other than nasopharyngeal swab, presence of viral mutation(s) within the areas targeted by this assay, and inadequate number of viral copies(<138 copies/mL). A negative result must be combined with clinical observations, patient history, and epidemiological information. The expected result is Negative.  Fact Sheet for Patients:  BloggerCourse.com  Fact Sheet for Healthcare Providers:   SeriousBroker.it  This test is no t yet approved or cleared by the [place] FDA and  has been authorized for detection and/or diagnosis of SARS-CoV-2 by FDA under an Emergency Use Authorization (EUA). This EUA will remain  in effect (meaning this test can be used) for the duration of the COVID-19 declaration under Section 564(b)(1) of the Act, 21 U.S.C.section 360bbb-3(b)(1), unless the authorization is terminated  or revoked sooner.       Influenza A by PCR NEGATIVE NEGATIVE   Influenza B by PCR NEGATIVE NEGATIVE  Comment: (NOTE) The Xpert Xpress SARS-CoV-2/FLU/RSV plus assay is intended as an aid in the diagnosis of influenza from Nasopharyngeal swab specimens and should not be used as a sole basis for treatment. Nasal washings and aspirates are unacceptable for Xpert Xpress SARS-CoV-2/FLU/RSV testing.  Fact Sheet for Patients: BloggerCourse.com  Fact Sheet for Healthcare Providers: SeriousBroker.it  This test is not yet approved or cleared by the [place] FDA and has been authorized for detection and/or diagnosis of SARS-CoV-2 by FDA under an Emergency Use Authorization (EUA). This EUA will remain in effect (meaning this test can be used) for the duration of the COVID-19 declaration under Section 564(b)(1) of the Act, 21 U.S.C. section 360bbb-3(b)(1), unless the authorization is terminated or revoked.  Performed at [facility], [address] [place], [place] [address]    CT HEAD WO CONTRAST ( )  Result Date: 10/14/2020 CLINICAL DATA:  Seizure, nontraumatic (Age >= 41y) EXAM: CT HEAD WITHOUT CONTRAST TECHNIQUE: Contiguous axial images were obtained from the base of the skull through the vertex without intravenous contrast. COMPARISON:  None. FINDINGS: Brain: Mild parenchymal volume loss is commensurate with the patient's age. Mild periventricular white matter changes are  present likely reflecting the sequela of small vessel ischemia. Since the prior examination, a focus of low attenuation is seen within the para falcine right frontal lobe in keeping with a subacute cortical infarct. No associated abnormal mass effect or midline shift. No acute intracranial hemorrhage. Ventricular size is normal. The cerebellum is unremarkable. Vascular: No asymmetric hyperdense vasculature at the skull base. Skull: Intact Sinuses/Orbits: The visualized paranasal sinuses are clear. The orbits are unremarkable. Other: Mastoid air cells and middle ear cavities are clear. IMPRESSION: Subacute right parafalcine parietal cortical infarct. No associated abnormal mass effect or acute intracranial hemorrhage. Stable senescent change Electronically Signed   By: Helyn Numbers M.D.   On: 10/14/2020 01:15    Pending Labs Unresulted Labs (From admission, onward)     Start     Ordered   10/21/20 0500  Creatinine, serum  (enoxaparin (LOVENOX)    CrCl >/= 30 ml/min)  Weekly,   STAT     Comments: while on enoxaparin therapy    10/14/20 0212   10/14/20 0500  Hemoglobin A1c  (Labs)  Tomorrow morning,   STAT        10/14/20 0212   10/14/20 0500  Lipid panel  (Labs)  Tomorrow morning,   STAT       Comments: Fasting    10/14/20 0212   10/14/20 0133  Urinalysis, Routine w reflex microscopic  ONCE - STAT,   STAT        10/14/20 0133   10/14/20 0033  Urine Drug Screen, Qualitative ([facility] only)  Once,   STAT        10/14/20 0033            Vitals/Pain Today's Vitals   10/14/20 0110 10/14/20 0115 10/14/20 0130 10/14/20 0145  BP: (!) 184/140 (!) 196/104 (!) 195/95 (!) 178/83  Pulse: 91 90 86 83  Resp: 16 16 (!) 22 17  Temp:      TempSrc:      SpO2: 95% 94% 96% 95%  Weight:      Height:        Isolation Precautions No active isolations  Medications Medications  haloperidol lactate (HALDOL) 5 MG/ML injection (0 mg  Hold 10/14/20 0219)  aspirin chewable tablet 81 mg (has no  administration in time range)  amLODipine (NORVASC) tablet 10  mg (has no administration in time range)  cloNIDine (CATAPRES - Dosed in mg/24 hr) patch 0.3 mg (has no administration in time range)  metoprolol tartrate (LOPRESSOR) tablet 100 mg (has no administration in time range)  prazosin (MINIPRESS) capsule 1 mg (has no administration in time range)  prazosin (MINIPRESS) capsule 4 mg (has no administration in time range)  atorvastatin (LIPITOR) tablet 40 mg (has no administration in time range)  hydrOXYzine (ATARAX/VISTARIL) tablet 50 mg (has no administration in time range)  QUEtiapine (SEROQUEL) tablet 25 mg (has no administration in time range)  traZODone (DESYREL) tablet 150 mg (has no administration in time range)  insulin glargine-yfgn (SEMGLEE) injection 20 Units (has no administration in time range)  DSS CAPS 200 mg (has no administration in time range)  pantoprazole (PROTONIX) EC tablet 40 mg (has no administration in time range)  cyclobenzaprine (FLEXERIL) tablet 10 mg (has no administration in time range)  gabapentin (NEURONTIN) capsule 100 mg (has no administration in time range)  gabapentin (NEURONTIN) capsule 300 mg (has no administration in time range)  loratadine (CLARITIN) tablet 10 mg (has no administration in time range)  promethazine (PHENERGAN) tablet 25 mg (has no administration in time range)  latanoprost (XALATAN) 0.005 % ophthalmic solution 1 drop (has no administration in time range)   stroke: mapping our early stages of recovery book (has no administration in time range)  0.9 %  sodium chloride infusion (has no administration in time range)  acetaminophen (TYLENOL) tablet 650 mg (has no administration in time range)    Or  acetaminophen (TYLENOL) 160 MG/5ML solution 650 mg (has no administration in time range)    Or  acetaminophen (TYLENOL) suppository 650 mg (has no administration in time range)  senna-docusate (Senokot-S) tablet 1 tablet (has no administration  in time range)  enoxaparin (LOVENOX) injection 40 mg (has no administration in time range)  ondansetron (ZOFRAN) injection 4 mg (4 mg Intravenous Given 10/14/20 0111)  aspirin suppository 300 mg (300 mg Rectal Given 10/14/20 0148)  haloperidol lactate (HALDOL) injection 2 mg (2 mg Intravenous Given 10/14/20 0156)    Mobility non-ambulatory     Focused Assessments Neuro Assessment Handoff:  Swallow screen pass? No          Neuro Assessment: Exceptions to WDL Neuro Checks:      Last Documented NIHSS Modified Score:   Has TPA been given? No If patient is a Neuro Trauma and patient is going to OR before floor call report to 4N Charge nurse: [phone] or [phone]   R Recommendations: See Admitting Provider Note  Report given to:   Additional Notes:

## 2020-10-14 NOTE — ED Notes (Signed)
Report called to randy rn icu nurse

## 2020-10-14 NOTE — ED Notes (Signed)
Pt moved to room with line of sight to nurses station

## 2020-10-14 NOTE — Procedures (Signed)
History: 60 year old male being evaluated for persistent encephalopathy after seizure  Sedation: Ativan, haldol given earlier in day  Technique: This EEG was acquired with electrodes placed according to the International 10-20 electrode system (including Fp1, Fp2, F3, F4, C3, C4, P3, P4, O1, O2, T3, T4, T5, T6, A1, A2, Fz, Cz, Pz). The following electrodes were missing or displaced: none.   Background: There is a posterior dominant rhythm of 9 to 10 Hz which is moderately organized.  There is also superimposed irregular delta and theta generalized slow activity intruding into the background.  There are periodic 1 to 1.5 Hz generalized triphasic morphology discharges which are anteriorly predominant which wax and wane, but with no clear evolution.  There is excessive anteriorly predominant beta activity throughout the recording.  Photic stimulation: Physiologic driving is not performed  EEG Abnormalities: 1) triphasic waves 2) generalized irregular slow activity 3) excessive beta activity  Clinical Interpretation: This EEG is consistent with a generalized nonspecific cerebral dysfunction (encephalopathy).   Triphasic waves are more associated with toxic or metabolic encephalopathy, but can rarely represent cortical irritability.  There was no definite seizure or seizure predisposition recorded on this study. Please note that lack of epileptiform activity on EEG does not preclude the possibility of epilepsy.   Ritta Slot, MD [facility] [phone]  If 7pm- 7am, please page neurology on call as listed in AMION.

## 2020-10-14 NOTE — ED Notes (Signed)
Meds given  pt to ct scan

## 2020-10-14 NOTE — Progress Notes (Signed)
*  PRELIMINARY RESULTS* Echocardiogram 2D Echocardiogram has been performed.  Wyatt Ball 10/14/2020, 10:23 AM

## 2020-10-14 NOTE — ED Notes (Signed)
NRB removed, O2 staying at 100% on RA

## 2020-10-15 DIAGNOSIS — J690 Pneumonitis due to inhalation of food and vomit: Secondary | ICD-10-CM

## 2020-10-15 LAB — GLUCOSE, CAPILLARY
Glucose-Capillary: 146 mg/dL — ABNORMAL HIGH (ref 70–99)
Glucose-Capillary: 148 mg/dL — ABNORMAL HIGH (ref 70–99)
Glucose-Capillary: 173 mg/dL — ABNORMAL HIGH (ref 70–99)
Glucose-Capillary: 174 mg/dL — ABNORMAL HIGH (ref 70–99)
Glucose-Capillary: 177 mg/dL — ABNORMAL HIGH (ref 70–99)
Glucose-Capillary: 241 mg/dL — ABNORMAL HIGH (ref 70–99)

## 2020-10-15 LAB — BASIC METABOLIC PANEL
Anion gap: 12 (ref 5–15)
BUN: 22 mg/dL (ref 8–23)
CO2: 20 mmol/L — ABNORMAL LOW (ref 22–32)
Calcium: 9.7 mg/dL (ref 8.9–10.3)
Chloride: 108 mmol/L (ref 98–111)
Creatinine, Ser: 1.56 mg/dL — ABNORMAL HIGH (ref 0.61–1.24)
GFR, Estimated: 50 mL/min — ABNORMAL LOW (ref >60)
Glucose, Bld: 221 mg/dL — ABNORMAL HIGH (ref 70–99)
Potassium: 3.8 mmol/L (ref 3.5–5.1)
Sodium: 140 mmol/L (ref 135–145)

## 2020-10-15 LAB — CBC
HCT: 33.8 % — ABNORMAL LOW (ref 39.0–52.0)
Hemoglobin: 11.6 g/dL — ABNORMAL LOW (ref 13.0–17.0)
MCH: 30.4 pg (ref 26.0–34.0)
MCHC: 34.3 g/dL (ref 30.0–36.0)
MCV: 88.7 fL (ref 80.0–100.0)
Platelets: 279 10*3/uL (ref 150–400)
RBC: 3.81 MIL/uL — ABNORMAL LOW (ref 4.22–5.81)
RDW: 12.5 % (ref 11.5–15.5)
WBC: 20 10*3/uL — ABNORMAL HIGH (ref 4.0–10.5)
nRBC: 0 % (ref 0.0–0.2)

## 2020-10-15 LAB — PROCALCITONIN: Procalcitonin: <0.1 ng/mL

## 2020-10-15 LAB — ECHOCARDIOGRAM LIMITED BUBBLE STUDY: S' Lateral: 2.6 cm

## 2020-10-15 MED ORDER — AMPICILLIN-SULBACTAM SODIUM 1.5 (1-0.5) G IJ SOLR
1.5000 g | Freq: Four times a day (QID) | INTRAMUSCULAR | Status: AC
Start: 2020-10-15 — End: 2020-10-17
  Administered 2020-10-15 – 2020-10-17 (×10): 1.5 g via INTRAVENOUS
  Filled 2020-10-15: qty 1.5
  Filled 2020-10-15: qty 4
  Filled 2020-10-15 (×9): qty 1.5

## 2020-10-15 MED ORDER — IPRATROPIUM-ALBUTEROL 0.5-2.5 (3) MG/3ML IN SOLN: 3.0000 mL | RESPIRATORY_TRACT | Status: DC | PRN

## 2020-10-15 MED ORDER — MORPHINE SULFATE (PF) 2 MG/ML IV SOLN
2.0000 mg | INTRAVENOUS | Status: DC | PRN
Start: 2020-10-15 — End: 2020-10-17
  Administered 2020-10-15 – 2020-10-17 (×9): 2 mg via INTRAVENOUS
  Filled 2020-10-15 (×11): qty 1

## 2020-10-15 NOTE — Progress Notes (Addendum)
Subjective: Slightly more awake today  Exam: Vitals:   10/15/20 0645 10/15/20 0715  BP: (!) 164/71 (!) 149/70  Pulse: 98 100  Resp: (!) 30 15  Temp:    SpO2: 98% 99%   Gen: In bed, NAD Resp: non-labored breathing, no acute distress Abd: soft, nt Neck: Supple  Neuro: MS: He is awake, he engages the examiner and responds more appropriately to noxious stimulation.  He says "what?"  He does not follow commands. 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  Treatment will be aggressive blood pressure control, and he has been lowered to the 160s to 170s, but still reaching the 180s overnight.  I would favor being more aggressive with control today.  Recommendations: 1) change BP goal to less than 140(I have updated the order) 2) continue Keppra 500 mg twice daily 3) neurology will continue to follow  This patient is critically ill and at significant risk of neurological worsening, death and care requires constant monitoring of vital signs, hemodynamics,respiratory and cardiac monitoring, neurological assessment, discussion with family, other specialists and medical decision making of high complexity. I spent 35 minutes of neurocritical care time  in the care of  this patient. This was time spent independent of any time provided by nurse practitioner or PA.  Ritta Slot, MD [facility] [phone]  If 7pm- 7am, please page neurology on call as listed in AMION. 10/15/2020  10:04 AM

## 2020-10-15 NOTE — Progress Notes (Addendum)
SLP Cancellation Note  Patient Details Name: Wyatt Ball. MRN: [id number] DOB: May 24, 1960   Cancelled treatment:       Reason Eval/Treat Not Completed: Patient not medically ready;Medical issues which prohibited therapy (chart reviewed; consulted NSG.) Pt is poorly arousable and incoherent to mod-max stim; moaning phonations only. He does not follow commands. Sitter present in room; bilateral mitts+. Pt is NPO. Discussed w/ NSG who recommended holding on pt's BSE and Cognitive-ling. evals at this time. ST services gave information and instruction on oral care for stimulation and hygiene to Sitter, NSG. Will f/u tomorrow monitoring pt's status for appropriateness for BSE. NSG agreed.    Jerilynn Som, MS, CCC-SLP Speech Language Pathologist Rehab Services [phone] [facility] 10/15/2020, 9:04 AM

## 2020-10-15 NOTE — Progress Notes (Signed)
Occupational Therapy Treatment Patient Details Name: Wyatt Ball. MRN: [id number] DOB: 05/11/1960 Today's Date: 10/15/2020   History of present illness Pt is a 60 y/o M admitted on 10/14/20 with c/c of AMS & seizures. While in the ED pt had a seizure episode with significantly elevated BP. Pt found to have subacute R parietal corical infarction with AMS/acute encephalopathy likely postictal with associated seizures. PMH: Dm2, HTN, anxiety, transverse myelitis   OT comments  Wyatt Ball was seen for OT/PT  co-tx this date. Session units split per hospital protocols. Pt presents wearing safety mitts, per RN has been confused and unable to follow any VCs this date. Pt notably responses to his own name when therapists enter the room responds "what" when called by his first name. Pt able to participate in bed mobility and ADL tasks as described below (See ADL section for additional details). He continues to be limited by significant cognitive impairments and limited functional use of BUE. He requires MAX A to complete face washing and TOTAL A +2 for all functional mobility. Pt is able to progress to close CGA for seated balance at EOB during session. Pt education limited 2/2 cognition. NT educated on strategies to support participation in functional tasks.  Pt is progressing toward OT goals and continues to benefit from skilled OT services to maximize return to PLOF and minimize risk of future falls, injury, caregiver burden, and readmission. Will continue to follow POC. Discharge recommendation remains appropriate.     Recommendations for follow up therapy are one component of a multi-disciplinary discharge planning process, led by the attending physician.  Recommendations may be updated based on patient status, additional functional criteria and insurance authorization.    Follow Up Recommendations  SNF;Supervision/Assistance - 24 hour    Equipment Recommendations  Other (comment)  (Defer)    Recommendations for Other Services      Precautions / Restrictions Precautions Precautions: Fall Restrictions Weight Bearing Restrictions: No       Mobility Bed Mobility Overal bed mobility: Needs Assistance Bed Mobility: Supine to Sit;Sit to Supine     Supine to sit: Total assist;+2 for physical assistance;+2 for safety/equipment Sit to supine: Total assist;+2 for physical assistance;+2 for safety/equipment   General bed mobility comments: Pt unable to assist due to absent command following. Mild resistance with BLE with supine>sit.    Transfers Overall transfer level: Needs assistance   Transfers: Lateral/Scoot Transfers          Lateral/Scoot Transfers: +2 physical assistance;Total assist General transfer comment: deferred - attempted lateral scooting with no lift of hips or attempts to participate appreciated by pt.    Balance Overall balance assessment: Needs assistance Sitting-balance support: Feet supported;Bilateral upper extremity supported Sitting balance-Leahy Scale: Poor Sitting balance - Comments: Improved from MAX A to CGA with occasional tactile cuing to correct posture by leaning to the left. Postural control: Right lateral lean                                 ADL either performed or assessed with clinical judgement   ADL Overall ADL's : Needs assistance/impaired                                       General ADL Comments: Pt continues to be significantly limited by lmpaired cognition, impaired functional use of BUE, and  decreased safety awareness. He requires TOTAL Assist for bed mobility +2; MAX A for face washing while seated EOB with intermittent +1 min A for seated balance. He is unable to sequence steps for oral care despite multimodal cueing and MAX assist while seated EOB.     Vision   Additional Comments: Will continue to monitor within functional context.   Perception     Praxis       Cognition Arousal/Alertness: Awake/alert;Lethargic Behavior During Therapy: Flat affect Overall Cognitive Status: No family/caregiver present to determine baseline cognitive functioning                                 General Comments: Pt unable to follow commands, droswy with eyes drifting shut. He is attentive to his name, answering with "yeah" or "what"        Exercises Other Exercises Other Exercises: OT facilitates seated UB grooming tasks as described above. See ADL section for additional details.   Shoulder Instructions       General Comments      Pertinent Vitals/ Pain       Pain Assessment: Faces Faces Pain Scale: No hurt  Home Living                                          Prior Functioning/Environment              Frequency  Min 1X/week        Progress Toward Goals  OT Goals(current goals can now be found in the care plan section)     Acute Rehab OT Goals Patient Stated Goal: unable to state OT Goal Formulation: Patient unable to participate in goal setting Time For Goal Achievement: 10/28/20 Potential to Achieve Goals: Fair  Plan      Co-evaluation    PT/OT/SLP Co-Evaluation/Treatment: Yes Reason for Co-Treatment: Complexity of the patient's impairments (multi-system involvement);Necessary to address cognition/behavior during functional activity;For patient/therapist safety;To address functional/ADL transfers PT goals addressed during session: Mobility/safety with mobility;Balance OT goals addressed during session: ADL's and self-care      AM-PAC OT "6 Clicks" Daily Activity     Outcome Measure   Help from another person eating meals?: A Lot Help from another person taking care of personal grooming?: A Lot Help from another person toileting, which includes using toliet, bedpan, or urinal?: A Lot Help from another person bathing (including washing, rinsing, drying)?: A Lot Help from another person to  put on and taking off regular upper body clothing?: A Lot Help from another person to put on and taking off regular lower body clothing?: A Lot 6 Click Score: 12    End of Session    OT Visit Diagnosis: Other abnormalities of gait and mobility (R26.89);Cognitive communication deficit (R41.841);Other symptoms and signs involving cognitive function Symptoms and signs involving cognitive functions: Other cerebrovascular disease   Activity Tolerance Other (comment) (Limited by cognition.)   Patient Left in bed;with call bell/phone within reach   Nurse Communication Mobility status        Time: 1336-1401 OT Time Calculation (min): 25 min  Charges: OT General Charges $OT Visit: 1 Visit OT Treatments $Self Care/Home Management : 8-22 mins  Rockney Ghee, M.S., OTR/L Ascom: [phone] 10/15/20, 3:42 PM

## 2020-10-15 NOTE — Progress Notes (Addendum)
PROGRESS NOTE   HPI was taken from Dr. Arville Care: Wyatt Ball. is a 60 y.o. male with medical history significant for type 2 diabetes mellitus, hypertension, and anxiety and transverse myelitis, who presented to the ER with acute onset of altered mental status.  The patient was reportedly witnessed to have a seizure episode by his roommate per EMS.  When EMS found him he was awake but confused.  His roommate could not be contacted.  While the patient was in the ER he had a seizure episode with significantly elevated blood pressure that was up to 189/114 and later 250/160.    ED Course: Upon presentation to the emergency room, blood pressure was 188/100 and later 199/93 and later 250/160 with otherwise normal vital signs.  Labs revealed a BUN of 21 with a creatinine of 1.54.  CBC showed mild anemia.  Coagulation profile was within normal.  Alcohol level was less than 10.  Respiratory panel is currently pending.   EKG as reviewed by me : EKG showed normal sinus rhythm with a rate of 84 with left atrial enlargement Imaging: Noncontrasted CT scan revealed subacute right Parietal cortical infarct with no abnormal mass or hemorrhage.  The patient was given 300 mg rectal aspirin and 2 mg of IV Haldol and 4 mg of IV Zofran.  Given hypertensive urgency during his seizure he was started on IV Cardene drip and was given 2 mg of IV Ativan for seizure and loaded with IV Keppra.  He will be admitted to a stepdown unit bed for further evaluation and management    [employer]  ZOX:[id number] DOB: July 21, 1960 DOA: 10/14/2020 PCP: System, Provider Not In    Assessment & Plan:   Active Problems:   Stroke (cerebrum) (HCC)   Hypertensive urgency   Subacute right parietal cortical infarction: as per CT scan. NPO. Speech is following. Continue w/ neuro checks. MRI brain shows possible PRES. Echo shows EF 65-70%, diastolic function was normal & no evidence of PFO or atrial septal defect. Neuro  following and recs apprec   Possible seizure: EEG shows is consistent w/ generalized nonspecific cerebral dysfunction (encephalopathy). No seizure meds at home as per med rec. Continue on keppra   HTN: w/ HTN urgency. Continue on IV cardene drip w/ goal of SBP < 140 as per neuro   Aspiration pneumonia: continue on unasyn, bronchodilators & encourage incentive spirometry   Leukocytosis: likely secondary to infection. Continue on IV abxs  Peripheral neuropathy: continue on home dose of gabapentin when no longer NPO   DM2: well controlled, HbA1c 5.5. Continue on SSI w/ accuchecks   Likely CKD: baseline Cr/GFR is unknown, currently IIIa. Cr is trending up from day prior   GERD: continue on PPI when no longer NPO   HLD: continue on statin when no longer NPO   Depression: severity unknown. Continue on home dose of seroquel when no longer NPO    Hx of anoxic brain injury: in June 2022 & was in coma for about 10-11 days & was previously ambulating w/ crutches ineffectively as per pt's brother   DVT prophylaxis: SCDs Code Status: full  Family Communication:  Disposition Plan: likely d/c to SNF   Level of care: Stepdown  Status is: Inpatient  Remains inpatient appropriate because:Unsafe d/c plan, IV treatments appropriate due to intensity of illness or inability to take PO, and Inpatient level of care appropriate due to severity of illness  Dispo: The patient is from: Home  Anticipated d/c is to: SNF              Patient currently is not medically stable to d/c.   Difficult to place patient : unclear    Consultants:  Neuro   Procedures:   Antimicrobials:    Subjective: Pt is still not talking this morning.   Objective: Vitals:   10/15/20 0615 10/15/20 0630 10/15/20 0645 10/15/20 0715  BP: (!) 147/76 (!) 152/68 (!) 164/71 (!) 149/70  Pulse: 97 98 98 100  Resp: 17 (!) 30 (!) 30 15  Temp:      TempSrc:      SpO2: 98% 99% 98% 99%  Weight:      Height:         Intake/Output Summary (Last 24 hours) at 10/15/2020 0841 Last data filed at 10/15/2020 0701 Gross per 24 hour  Intake 3341.78 ml  Output 2490 ml  Net 851.78 ml   Filed Weights   10/14/20 0030  Weight: 63 kg    Examination:  General exam: Appears calm  Respiratory system: decreased breath sounds b/l  Cardiovascular system: S1/S2+. No rubs or clicks  Gastrointestinal system: Abd is soft, NT, ND & hypoactive bowel sounds  Central nervous system: Lethargic. Intermittently follows simple commands  Psychiatry: judgement and insight appears poor. Flat mood and affect    Data Reviewed: I have personally reviewed following labs and imaging studies  CBC: Recent Labs  Lab 10/14/20 0029 10/15/20 0512  WBC 7.5 20.0*  HGB 12.1* 11.6*  HCT 36.3* 33.8*  MCV 92.6 88.7  PLT 282 279   Basic Metabolic Panel: Recent Labs  Lab 10/14/20 0029 10/15/20 0512  NA 136 140  K 4.4 3.8  CL 101 108  CO2 25 20*  GLUCOSE 165* 221*  BUN 21 22  CREATININE 1.54* 1.56*  CALCIUM 9.6 9.7   GFR: Estimated Creatinine Clearance: 43.3 mL/min (A) (by C-G formula based on SCr of 1.56 mg/dL (H)). Liver Function Tests: Recent Labs  Lab 10/14/20 0029  AST 14*  ALT 10  ALKPHOS 82  BILITOT 0.6  PROT 7.0  ALBUMIN 3.8   No results for input(s): LIPASE, AMYLASE in the last 168 hours. No results for input(s): AMMONIA in the last 168 hours. Coagulation Profile: Recent Labs  Lab 10/14/20 0029  INR 1.0   Cardiac Enzymes: No results for input(s): CKTOTAL, CKMB, CKMBINDEX, TROPONINI in the last 168 hours. BNP (last 3 results) No results for input(s): PROBNP in the last 8760 hours. HbA1C: Recent Labs    10/14/20 0718  HGBA1C 5.5   CBG: Recent Labs  Lab 10/14/20 1824 10/14/20 1917 10/14/20 2227 10/15/20 0324 10/15/20 0711  GLUCAP 178* 155* 186* 241* 174*   Lipid Profile: Recent Labs    10/14/20 0718  CHOL 125  HDL 33*  LDLCALC 67  TRIG 741  CHOLHDL 3.8   Thyroid Function  Tests: No results for input(s): TSH, T4TOTAL, FREET4, T3FREE, THYROIDAB in the last 72 hours. Anemia Panel: No results for input(s): VITAMINB12, FOLATE, FERRITIN, TIBC, IRON, RETICCTPCT in the last 72 hours. Sepsis Labs: No results for input(s): PROCALCITON, LATICACIDVEN in the last 168 hours.  Recent Results (from the past 240 hour(s))  Resp Panel by RT-PCR (Flu A&B, Covid) Nasopharyngeal Swab     Status: None   Collection Time: 10/14/20  1:48 AM   Specimen: Nasopharyngeal Swab; Nasopharyngeal(NP) swabs in vial transport medium  Result Value Ref Range Status   SARS Coronavirus 2 by RT PCR NEGATIVE NEGATIVE Final  Comment: (NOTE) SARS-CoV-2 target nucleic acids are NOT DETECTED.  The SARS-CoV-2 RNA is generally detectable in upper respiratory specimens during the acute phase of infection. The lowest concentration of SARS-CoV-2 viral copies this assay can detect is 138 copies/mL. A negative result does not preclude SARS-Cov-2 infection and should not be used as the sole basis for treatment or other patient management decisions. A negative result may occur with  improper specimen collection/handling, submission of specimen other than nasopharyngeal swab, presence of viral mutation(s) within the areas targeted by this assay, and inadequate number of viral copies(<138 copies/mL). A negative result must be combined with clinical observations, patient history, and epidemiological information. The expected result is Negative.  Fact Sheet for Patients:  BloggerCourse.com  Fact Sheet for Healthcare Providers:  SeriousBroker.it  This test is no t yet approved or cleared by the [place] FDA and  has been authorized for detection and/or diagnosis of SARS-CoV-2 by FDA under an Emergency Use Authorization (EUA). This EUA will remain  in effect (meaning this test can be used) for the duration of the COVID-19 declaration under Section  564(b)(1) of the Act, 21 U.S.C.section 360bbb-3(b)(1), unless the authorization is terminated  or revoked sooner.       Influenza A by PCR NEGATIVE NEGATIVE Final   Influenza B by PCR NEGATIVE NEGATIVE Final    Comment: (NOTE) The Xpert Xpress SARS-CoV-2/FLU/RSV plus assay is intended as an aid in the diagnosis of influenza from Nasopharyngeal swab specimens and should not be used as a sole basis for treatment. Nasal washings and aspirates are unacceptable for Xpert Xpress SARS-CoV-2/FLU/RSV testing.  Fact Sheet for Patients: BloggerCourse.com  Fact Sheet for Healthcare Providers: SeriousBroker.it  This test is not yet approved or cleared by the [place] FDA and has been authorized for detection and/or diagnosis of SARS-CoV-2 by FDA under an Emergency Use Authorization (EUA). This EUA will remain in effect (meaning this test can be used) for the duration of the COVID-19 declaration under Section 564(b)(1) of the Act, 21 U.S.C. section 360bbb-3(b)(1), unless the authorization is terminated or revoked.  Performed at [facility], [address] [place], [place] [address]   CULTURE, BLOOD (ROUTINE X 2) w Reflex to ID Panel     Status: None (Preliminary result)   Collection Time: 10/14/20  3:44 PM   Specimen: BLOOD  Result Value Ref Range Status   Specimen Description BLOOD BLOOD RIGHT ARM  Final   Special Requests   Final    BOTTLES DRAWN AEROBIC AND ANAEROBIC Blood Culture adequate volume   Culture   Final    NO GROWTH < 24 HOURS Performed at [facility], [address] [place], [place] [address]    Report Status PENDING  Incomplete  CULTURE, BLOOD (ROUTINE X 2) w Reflex to ID Panel     Status: None (Preliminary result)   Collection Time: 10/14/20  4:06 PM   Specimen: BLOOD  Result Value Ref Range Status   Specimen Description BLOOD RIGHT ANTECUBITAL  Final   Special Requests   Final     BOTTLES DRAWN AEROBIC AND ANAEROBIC Blood Culture adequate volume   Culture   Final    NO GROWTH < 24 HOURS Performed at [facility], [address] [place], [place] [address]    Report Status PENDING  Incomplete  MRSA Next Gen by PCR, Nasal     Status: None   Collection Time: 10/14/20  6:12 PM   Specimen: Nasal Mucosa; Nasal Swab  Result  Value Ref Range Status   MRSA by PCR Next Gen NOT DETECTED NOT DETECTED Final    Comment: (NOTE) The GeneXpert MRSA Assay (FDA approved for NASAL specimens only), is one component of a comprehensive MRSA colonization surveillance program. It is not intended to diagnose MRSA infection nor to guide or monitor treatment for MRSA infections. Test performance is not FDA approved in patients less than 65 years old. Performed at [facility], [address], [place], [place] [address]          Radiology Studies: CT ANGIO HEAD NECK W WO CM  Result Date: 10/14/2020 CLINICAL DATA:  Speech disturbance.  Witnessed seizure. EXAM: CT ANGIOGRAPHY HEAD AND NECK TECHNIQUE: Multidetector CT imaging of the head and neck was performed using the standard protocol during bolus administration of intravenous contrast. Multiplanar CT image reconstructions and MIPs were obtained to evaluate the vascular anatomy. Carotid stenosis measurements (when applicable) are obtained utilizing NASCET criteria, using the distal internal carotid diameter as the denominator. CONTRAST:  27mL OMNIPAQUE IOHEXOL 350 MG/ML SOLN COMPARISON:  CT and MRI earlier today. FINDINGS: CT HEAD FINDINGS Brain: There is worsening of cortical and subcortical edema compared to the CT scan done earlier today. This affects both cerebral hemispheres and the cerebellum and is primarily subcortical, consistent with posterior reversible encephalopathy as noted by MRI. No evidence of hemorrhage. No mass, hydrocephalus or extra-axial collection. Vascular: There is atherosclerotic calcification of  the major vessels at the base of the brain. Skull: Negative Sinuses: Clear Orbits: Normal Review of the MIP images confirms the above findings CTA NECK FINDINGS Aortic arch: Aortic atherosclerosis. Branching pattern is normal without origin stenosis. Right carotid system: Common carotid artery widely patent to the bifurcation region. Calcified plaque at the carotid bifurcation but no stenosis. Cervical ICA is widely patent. Left carotid system: Common carotid artery widely patent to the bifurcation. Carotid bifurcation is normal. Cervical ICA is widely patent. Vertebral arteries: Both vertebral artery origins are widely patent. The left vertebral artery is strongly dominant. Both vertebral arteries are patent to the foramen magnum. Skeleton: Ordinary mid cervical spondylosis. Other neck: No mass or lymphadenopathy.  Normal Upper chest: Normal Review of the MIP images confirms the above findings CTA HEAD FINDINGS Anterior circulation: Both internal carotid arteries are patent through the skull base and siphon regions. Minimal siphon atherosclerotic calcification but no stenosis. The anterior and middle cerebral vessels are widely patent. No large vessel occlusion or correctable proximal stenosis. No aneurysm or vascular malformation. Posterior circulation: Both vertebral arteries are widely patent through the foramen magnum to the basilar. No basilar stenosis. Posterior circulation branch vessels appear normal. Venous sinuses: Patent and normal. Anatomic variants: None significant. Review of the MIP images confirms the above findings IMPRESSION: Worsening of brain edema consistent with worsened imaging findings of posterior reversible encephalopathy. No evidence of hemorrhage. No large vessel occlusion. Mild aortic atherosclerotic calcification. Mild carotid bifurcation calcification on the right but without stenosis on either side. No intracranial stenosis, aneurysm or vascular malformation. Electronically Signed    By: Paulina Fusi M.D.   On: 10/14/2020 17:22   CT HEAD WO CONTRAST ( )  Result Date: 10/14/2020 CLINICAL DATA:  Seizure, nontraumatic (Age >= 41y) EXAM: CT HEAD WITHOUT CONTRAST TECHNIQUE: Contiguous axial images were obtained from the base of the skull through the vertex without intravenous contrast. COMPARISON:  None. FINDINGS: Brain: Mild parenchymal volume loss is commensurate with the patient's age. Mild periventricular white matter changes are present likely reflecting the sequela of small  vessel ischemia. Since the prior examination, a focus of low attenuation is seen within the para falcine right frontal lobe in keeping with a subacute cortical infarct. No associated abnormal mass effect or midline shift. No acute intracranial hemorrhage. Ventricular size is normal. The cerebellum is unremarkable. Vascular: No asymmetric hyperdense vasculature at the skull base. Skull: Intact Sinuses/Orbits: The visualized paranasal sinuses are clear. The orbits are unremarkable. Other: Mastoid air cells and middle ear cavities are clear. IMPRESSION: Subacute right parafalcine parietal cortical infarct. No associated abnormal mass effect or acute intracranial hemorrhage. Stable senescent change Electronically Signed   By: Helyn Numbers M.D.   On: 10/14/2020 01:15   MR BRAIN WO CONTRAST  Result Date: 10/14/2020 CLINICAL DATA:  Stroke, follow up.  Seizure.  Hypertensive urgency. EXAM: MRI HEAD WITHOUT CONTRAST TECHNIQUE: Multiplanar, multiecho pulse sequences of the brain and surrounding structures were obtained without intravenous contrast. COMPARISON:  Head CT 10/14/2020 and MRI 06/07/2020 FINDINGS: Brain: There are extensive patchy areas of T2 hyperintensity compatible with edema throughout both cerebral and cerebellar hemispheres in a symmetric fashion. This is greatest in the subcortical white matter although the deep white matter and cortex are also involved. Involvement is greatest in the temporal-occipital  regions and in the frontoparietal regions towards the vertex. There is also patchy T2 hyperintensity in the pons. There is mild involvement of the left thalamus. There is no restricted diffusion to indicate an acute infarct. No intracranial hemorrhage, midline shift, or extra-axial fluid collection is identified. There is mild cerebral atrophy. Vascular: Major intracranial vascular flow voids are preserved. Skull and upper cervical spine: Unremarkable bone marrow signal. Sinuses/Orbits: Unremarkable orbits. Clear paranasal sinuses. Small bilateral mastoid effusions. Other: None. IMPRESSION: 1. 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 2. No acute infarct. Electronically Signed   By: Sebastian Ache M.D.   On: 10/14/2020 06:10   DG Chest Port 1 View  Result Date: 10/14/2020 CLINICAL DATA:  Aspiration EXAM: PORTABLE CHEST 1 VIEW COMPARISON:  Chest radiograph 07/31/2020 FINDINGS: The heart is mildly enlarged, unchanged. The mediastinal contours are stable, allowing for low lung volumes and AP technique. Lung volumes are low. There are patchy opacities in the right base. There is vascular congestion without definite overt pulmonary edema. There is no pleural effusion or pneumothorax. There is no acute osseous abnormality. IMPRESSION: 1. Patchy opacities in the right base may reflect infection or aspiration. 2. Cardiomegaly with vascular congestion but no definite overt pulmonary edema. Electronically Signed   By: Lesia Hausen M.D.   On: 10/14/2020 15:30   EEG adult  Result Date: 10/14/2020 Rejeana Brock, MD     10/14/2020  6:25 PM History: 60 year old male being evaluated for persistent encephalopathy after seizure Sedation: Ativan, haldol given earlier in day Technique: This EEG was acquired with electrodes placed according to the International 10-20 electrode system (including Fp1, Fp2,  F3, F4, C3, C4, P3, P4, O1, O2, T3, T4, T5, T6, A1, A2, Fz, Cz, Pz). The following electrodes were missing or displaced: none. Background: There is a posterior dominant rhythm of 9 to 10 Hz which is moderately organized.  There is also superimposed irregular delta and theta generalized slow activity intruding into the background.  There are periodic 1 to 1.5 Hz generalized triphasic morphology discharges which are anteriorly predominant which wax and wane, but with no clear evolution.  There is excessive anteriorly predominant beta activity throughout the recording. Photic stimulation: Physiologic driving is  not performed EEG Abnormalities: 1) triphasic waves 2) generalized irregular slow activity 3) excessive beta activity Clinical Interpretation: This EEG is consistent with a generalized nonspecific cerebral dysfunction (encephalopathy). Triphasic waves are more associated with toxic or metabolic encephalopathy, but can rarely represent cortical irritability. There was no definite seizure or seizure predisposition recorded on this study. Please note that lack of epileptiform activity on EEG does not preclude the possibility of epilepsy. Ritta Slot, MD [facility] [phone] If 7pm- 7am, please page neurology on call as listed in AMION.        Scheduled Meds:   stroke: mapping our early stages of recovery book   Does not apply Once   amLODipine  10 mg Oral Daily   aspirin  81 mg Oral Daily   atorvastatin  40 mg Oral Daily   Chlorhexidine Gluconate Cloth  6 each Topical Q0600   cloNIDine  0.3 mg Transdermal Weekly   docusate sodium  200 mg Oral BID   enoxaparin (LOVENOX) injection  40 mg Subcutaneous Q24H   gabapentin  100 mg Oral BID WC   gabapentin  300 mg Oral QHS   insulin aspart  0-9 Units Subcutaneous Q4H   latanoprost  1 drop Both Eyes QHS   loratadine  10 mg Oral Daily   metoprolol tartrate  10 mg Intravenous Q6H   pantoprazole  40 mg Oral Daily   prazosin  4 mg  Oral QHS   QUEtiapine  25 mg Oral BID   traZODone  150 mg Oral QHS   Continuous Infusions:  sodium chloride 100 mL/hr at 10/15/20 0456   ampicillin-sulbactam (UNASYN) IV     levETIRAcetam 500 mg (10/15/20 0332)   niCARDipine 8 mg/hr (10/15/20 0701)     LOS: 1 day    Time spent: 30 mins     Charise Killian, MD [facility] Pager [phone]  If 7PM-7AM, please contact night-coverage 10/15/2020, 8:41 AM

## 2020-10-15 NOTE — Progress Notes (Signed)
Physical Therapy Treatment Patient Details Name: Wyatt Ball. MRN: [id number] DOB: [birth year] Today's Date: 10/15/2020   History of Present Illness Pt is a 60 y/o M admitted on 10/14/20 with c/c of AMS & seizures. While in the ED pt had a seizure episode with significantly elevated BP. Pt found to have subacute R parietal corical infarction with AMS/acute encephalopathy likely postictal with associated seizures. PMH: Dm2, HTN, anxiety, transverse myelitis    PT Comments    Pt received supine in bed, sitter in room. PT/OT co-tx due to pt complexity including physical and cognitive impairments. Pt attentive to PT each time PT stated his name, pt would open eyes and respond with "yeah" or "what" and turn his head in the direction of the voice - increased attention noted to the right however pt did turn head in both directions. Pt remained sitting EOB for 15 minutes, PT assisting with sitting balance and pt responding to tactile cues to correct rightward lean and posture, while OT focused on self-care. Pt does remain Total Ax2 for bed mobility with absent command following. Boosting/scooting attempted while sitting EOB - pt did not participate so STS was deferred at this date. Would benefit from skilled PT to address above deficits and promote optimal return to PLOF.   Recommendations for follow up therapy are one component of a multi-disciplinary discharge planning process, led by the attending physician.  Recommendations may be updated based on patient status, additional functional criteria and insurance authorization.  Follow Up Recommendations  SNF     Equipment Recommendations  None recommended by PT    Recommendations for Other Services       Precautions / Restrictions Precautions Precautions: Fall Restrictions Weight Bearing Restrictions: No     Mobility  Bed Mobility Overal bed mobility: Needs Assistance Bed Mobility: Supine to Sit;Sit to Supine     Supine to sit:  Total assist;+2 for physical assistance;+2 for safety/equipment Sit to supine: Total assist;+2 for physical assistance;+2 for safety/equipment   General bed mobility comments: Pt unable to assist due to absent command following. Mild resistance with BLE with supine>sit.    Transfers                 General transfer comment: deferred - attempted lateral scooting with no lift of hips  Ambulation/Gait                 Stairs             Wheelchair Mobility    Modified Rankin (Stroke Patients Only)       Balance Overall balance assessment: Needs assistance Sitting-balance support: Feet supported;Bilateral upper extremity supported Sitting balance-Leahy Scale: Poor Sitting balance - Comments: Improved from MAX A to CGA with occasional tactile cuing to correct posture by leaning to the left. Postural control: Right lateral lean                                  Cognition Arousal/Alertness: Awake/alert Behavior During Therapy: Flat affect Overall Cognitive Status: No family/caregiver present to determine baseline cognitive functioning                                 General Comments: Pt unable to follow commands, droswy with eyes drifting shut. He is attentive to his name, answering with "yeah" or "what"      Exercises  General Comments        Pertinent Vitals/Pain Pain Assessment: Faces Faces Pain Scale: No hurt    Home Living                      Prior Function            PT Goals (current goals can now be found in the care plan section) Acute Rehab PT Goals PT Goal Formulation: Patient unable to participate in goal setting    Frequency    Min 2X/week      PT Plan      Co-evaluation PT/OT/SLP Co-Evaluation/Treatment: Yes Reason for Co-Treatment: Complexity of the patient's impairments (multi-system involvement);Necessary to address cognition/behavior during functional activity;For  patient/therapist safety;To address functional/ADL transfers PT goals addressed during session: Mobility/safety with mobility;Balance OT goals addressed during session: ADL's and self-care      AM-PAC PT "6 Clicks" Mobility   Outcome Measure  Help needed turning from your back to your side while in a flat bed without using bedrails?: Total Help needed moving from lying on your back to sitting on the side of a flat bed without using bedrails?: Total Help needed moving to and from a bed to a chair (including a wheelchair)?: Total Help needed standing up from a chair using your arms (e.g., wheelchair or bedside chair)?: Total Help needed to walk in hospital room?: Total Help needed climbing 3-5 steps with a railing? : Total 6 Click Score: 6    End of Session   Activity Tolerance: Patient tolerated treatment well Patient left: in bed;with call bell/phone within reach;with nursing/sitter in room Nurse Communication: Mobility status PT Visit Diagnosis: Unsteadiness on feet (R26.81);Muscle weakness (generalized) (M62.81);Difficulty in walking, not elsewhere classified (R26.2);Other abnormalities of gait and mobility (R26.89)     Time: 1336-1401 PT Time Calculation (min) (ACUTE ONLY): 25 min  Charges:  $Neuromuscular Re-education: 8-22 mins                     Basilia Jumbo PT, DPT 10/15/20 3:13 PM [phone]

## 2020-10-16 DIAGNOSIS — R338 Other retention of urine: Secondary | ICD-10-CM

## 2020-10-16 DIAGNOSIS — I6783 Posterior reversible encephalopathy syndrome: Secondary | ICD-10-CM

## 2020-10-16 DIAGNOSIS — N1831 Chronic kidney disease, stage 3a: Secondary | ICD-10-CM

## 2020-10-16 DIAGNOSIS — R710 Precipitous drop in hematocrit: Secondary | ICD-10-CM

## 2020-10-16 DIAGNOSIS — R569 Unspecified convulsions: Secondary | ICD-10-CM

## 2020-10-16 DIAGNOSIS — T17800S Unspecified foreign body in other parts of respiratory tract causing asphyxiation, sequela: Secondary | ICD-10-CM

## 2020-10-16 DIAGNOSIS — T17800A Unspecified foreign body in other parts of respiratory tract causing asphyxiation, initial encounter: Secondary | ICD-10-CM

## 2020-10-16 LAB — GLUCOSE, CAPILLARY
Glucose-Capillary: 124 mg/dL — ABNORMAL HIGH (ref 70–99)
Glucose-Capillary: 151 mg/dL — ABNORMAL HIGH (ref 70–99)
Glucose-Capillary: 117 mg/dL — ABNORMAL HIGH (ref 70–99)
Glucose-Capillary: 184 mg/dL — ABNORMAL HIGH (ref 70–99)
Glucose-Capillary: 180 mg/dL — ABNORMAL HIGH (ref 70–99)

## 2020-10-16 LAB — MAGNESIUM: Magnesium: 1.7 mg/dL (ref 1.7–2.4)

## 2020-10-16 LAB — CBC
HCT: 30.1 % — ABNORMAL LOW (ref 39.0–52.0)
Hemoglobin: 9.8 g/dL — ABNORMAL LOW (ref 13.0–17.0)
MCH: 30.2 pg (ref 26.0–34.0)
MCHC: 32.6 g/dL (ref 30.0–36.0)
MCV: 92.6 fL (ref 80.0–100.0)
Platelets: 221 10*3/uL (ref 150–400)
RBC: 3.25 MIL/uL — ABNORMAL LOW (ref 4.22–5.81)
RDW: 12.7 % (ref 11.5–15.5)
WBC: 10.1 10*3/uL (ref 4.0–10.5)
nRBC: 0 % (ref 0.0–0.2)

## 2020-10-16 LAB — BASIC METABOLIC PANEL
Anion gap: 8 (ref 5–15)
BUN: 21 mg/dL (ref 8–23)
CO2: 23 mmol/L (ref 22–32)
Calcium: 9 mg/dL (ref 8.9–10.3)
Chloride: 113 mmol/L — ABNORMAL HIGH (ref 98–111)
Creatinine, Ser: 1.52 mg/dL — ABNORMAL HIGH (ref 0.61–1.24)
GFR, Estimated: 52 mL/min — ABNORMAL LOW (ref >60)
Glucose, Bld: 135 mg/dL — ABNORMAL HIGH (ref 70–99)
Potassium: 3.3 mmol/L — ABNORMAL LOW (ref 3.5–5.1)
Sodium: 144 mmol/L (ref 135–145)

## 2020-10-16 MED ORDER — METOPROLOL SUCCINATE ER 50 MG PO TB24
50.0000 mg | ORAL_TABLET | Freq: Every day | ORAL | Status: DC
  Administered 2020-10-16 – 2020-10-17 (×2): 50 mg via ORAL
  Filled 2020-10-16 (×2): qty 1

## 2020-10-16 MED ORDER — FINASTERIDE 5 MG PO TABS
5.0000 mg | ORAL_TABLET | Freq: Every day | ORAL | Status: DC
  Administered 2020-10-17 – 2020-10-20 (×4): 5 mg via ORAL
  Filled 2020-10-16 (×4): qty 1

## 2020-10-16 MED ORDER — LABETALOL HCL 5 MG/ML IV SOLN
10.0000 mg | INTRAVENOUS | Status: DC | PRN
  Administered 2020-10-16 – 2020-10-17 (×5): 10 mg via INTRAVENOUS
  Filled 2020-10-16 (×5): qty 4

## 2020-10-16 MED ORDER — MAGNESIUM SULFATE IN D5W 1-5 GM/100ML-% IV SOLN
1.0000 g | Freq: Once | INTRAVENOUS | Status: AC
  Administered 2020-10-16: 1 g via INTRAVENOUS
  Filled 2020-10-16: qty 100

## 2020-10-16 MED ORDER — POTASSIUM CHLORIDE 10 MEQ/100ML IV SOLN
10.0000 meq | INTRAVENOUS | Status: AC
Start: 2020-10-16 — End: 2020-10-16
  Administered 2020-10-16 (×4): 10 meq via INTRAVENOUS
  Filled 2020-10-16 (×4): qty 100

## 2020-10-16 MED ORDER — METOPROLOL SUCCINATE ER 50 MG PO TB24: 25.0000 mg | ORAL_TABLET | Freq: Every day | ORAL | Status: DC

## 2020-10-16 MED ORDER — LABETALOL HCL 5 MG/ML IV SOLN
10.0000 mg | INTRAVENOUS | Status: DC | PRN
Start: 2020-10-16 — End: 2020-10-16

## 2020-10-16 NOTE — Progress Notes (Signed)
PT Cancellation Note  Patient Details Name: Wyatt Ball. MRN: [id number] DOB: 1960-12-07   Cancelled Treatment:     PT attempt. Pt just finished working with OT and is soundly sleeping. Will return later this afternoon and continue to follow per current POC.    Rushie Chestnut 10/16/2020, 10:49 AM

## 2020-10-16 NOTE — Progress Notes (Signed)
Occupational Therapy Treatment Patient Details Name: Wyatt Ball. MRN: [id number] DOB: [birth year] Today's Date: 10/16/2020   History of present illness Pt is a 60 y/o M admitted on 10/14/20 with c/c of AMS & seizures. While in the ED pt had a seizure episode with significantly elevated BP. Pt found to have subacute R parietal corical infarction with AMS/acute encephalopathy likely postictal with associated seizures. PMH: Dm2, HTN, anxiety, transverse myelitis   OT comments  Pt seen for OT treatment this date to f/u re: safety with ADLs/ADL mobility. Pt requires MAX/TOTAL A to come to EOB sitting with HOB elevated. Pt requires MOD/MAX A with hand to mouth to take sips of thickened liquids and attempt ice chips. Pt is difficult to motivate for oral hygiene and thus requires increased encouragement. Pt requires MOD/MAX A to roll once back in bed to rearrange bed linens. Pt left with all needs met and in reach, bed alarm activated at end of session.   Recommendations for follow up therapy are one component of a multi-disciplinary discharge planning process, led by the attending physician.  Recommendations may be updated based on patient status, additional functional criteria and insurance authorization.    Follow Up Recommendations  SNF;Supervision/Assistance - 24 hour    Equipment Recommendations  Other (comment)    Recommendations for Other Services      Precautions / Restrictions Precautions Precautions: Fall Restrictions Weight Bearing Restrictions: No       Mobility Bed Mobility Overal bed mobility: Needs Assistance Bed Mobility: Supine to Sit;Sit to Supine     Supine to sit: Total assist;HOB elevated Sit to supine: Max assist;+2 for physical assistance   General bed mobility comments: pt with increased command following and contributes by reaching for bed rail in rolling.    Transfers                 General transfer comment: deferred    Balance        Sitting balance - Comments: MOD/MAX A For static sitting initially, progresses as more time spents sitting to CGA/MIN A.       Standing balance comment: deferred                           ADL either performed or assessed with clinical judgement   ADL Overall ADL's : Needs assistance/impaired Eating/Feeding: Moderate assistance;Sitting Eating/Feeding Details (indicate cue type and reason): supported sitting EOB with UEs for balance with L lateral lean towards HOB. OT places two pillows to help support as well as moderate intermittent  cues for pt to right himself d/t noted posterior lean. MOD A for hand over hand to drink thickedned liquids (nectar) and hand over hand to manage spoon with ice chips Grooming: Oral care;Maximal assistance;Sitting Grooming Details (indicate cue type and reason): MAX A for stimuli to participate in task, hand over hand to contribute to ~15-20% of task         Upper Body Dressing : Moderate assistance;Maximal assistance;Sitting Upper Body Dressing Details (indicate cue type and reason): seated EOB with moderately supported sitting HOB elevated to L lateral lean as needed when fatigued), with multimodal cues (tactile appears to be best) to participate including iniitation, sequence and termination of task                         Vision   Additional Comments: difficult to formally assess d/t cognitive function   Perception  Praxis      Cognition Arousal/Alertness: Awake/alert;Lethargic Behavior During Therapy: Flat affect Overall Cognitive Status: No family/caregiver present to determine baseline cognitive functioning                                 General Comments: Drowsy, requires increased processing time and cues, lmited verbalizations, requires stimuli to maintain wakefullness throughout        Exercises Other Exercises Other Exercises: OT engages pt in seated dressing, g/h and self feeding tasks    Shoulder Instructions       General Comments      Pertinent Vitals/ Pain       Pain Assessment: No/denies pain Faces Pain Scale: Hurts a little bit Pain Location: grimmace/moan with sup<>sit  Home Living                                          Prior Functioning/Environment              Frequency  Min 1X/week        Progress Toward Goals  OT Goals(current goals can now be found in the care plan section)  Progress towards OT goals: Progressing toward goals  Acute Rehab OT Goals Patient Stated Goal: unable to state OT Goal Formulation: Patient unable to participate in goal setting Time For Goal Achievement: 10/28/20 Potential to Achieve Goals: [place] Discharge plan remains appropriate    Co-evaluation    PT/OT/SLP Co-Evaluation/Treatment: Yes Reason for Co-Treatment: Complexity of the patient's impairments (multi-system involvement);Necessary to address cognition/behavior during functional activity;For patient/therapist safety   OT goals addressed during session: ADL's and self-care;Strengthening/ROM SLP goals addressed during session: Swallowing    AM-PAC OT "6 Clicks" Daily Activity     Outcome Measure   Help from another person eating meals?: A Lot Help from another person taking care of personal grooming?: A Lot Help from another person toileting, which includes using toliet, bedpan, or urinal?: A Lot Help from another person bathing (including washing, rinsing, drying)?: A Lot Help from another person to put on and taking off regular upper body clothing?: A Lot Help from another person to put on and taking off regular lower body clothing?: A Lot 6 Click Score: 12    End of Session    OT Visit Diagnosis: Other abnormalities of gait and mobility (R26.89);Cognitive communication deficit (R41.841);Other symptoms and signs involving cognitive function Symptoms and signs involving cognitive functions: Other cerebrovascular disease    Activity Tolerance Patient limited by lethargy   Patient Left in bed;with call bell/phone within reach;with bed alarm set   Nurse Communication Mobility status        Time: 0762-2633 OT Time Calculation (min): 28 min  Charges: OT General Charges $OT Visit: 1 Visit OT Treatments $Self Care/Home Management : 8-22 mins $Therapeutic Activity: 8-22 mins  Gerrianne Scale, [employer], OTR/L ascom [phone] 10/16/20, 5:16 PM

## 2020-10-16 NOTE — Progress Notes (Signed)
Subjective: Somewhat improved.  BP control is improving, off of nicardipine at this time.  Exam: Vitals:   10/16/20 0900 10/16/20 1000  BP: (!) 146/66 129/65  Pulse: 83 69  Resp: 20 (!) 22  Temp:    SpO2: 99% 99%   Gen: In bed, NAD Resp: non-labored breathing, no acute distress Abd: soft, nt Neck: Supple  Neuro: MS: He is somnolent, but I am able to arouse him with a brief sternal rub.  He is able to answer simple questions this morning, though he does not answer orientation questions.  He follows commands. 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  He continues to improve but is still fairly encephalopathic.  Recommendations: 1) continue strict blood pressure control, less than 140 systolic, could use as needed labetalol, also nicardipine if needed. 2) continue Keppra 500 mg twice daily 3) neurology will continue to follow    Ritta Slot, MD [facility] [phone]  If 7pm- 7am, please page neurology on call as listed in AMION. 10/16/2020  11:40 AM

## 2020-10-16 NOTE — Plan of Care (Signed)

## 2020-10-16 NOTE — Evaluation (Addendum)
Clinical/Bedside Swallow Evaluation Patient Details  Name: Wyatt Ball. MRN: [id number] Date of Birth: 04-06-1960  Today's Date: 10/16/2020 Time: SLP Start Time (ACUTE ONLY): 1020 SLP Stop Time (ACUTE ONLY): 1105 SLP Time Calculation (min) (ACUTE ONLY): 45 min  Past Medical History:  Past Medical History:  Diagnosis Date   Anxiety    DM (diabetes mellitus) (HCC)    HTN (hypertension)    Transverse myelitis (HCC)    Past Surgical History:  Past Surgical History:  Procedure Laterality Date   PEG PLACEMENT N/A 07/03/2020   Procedure: PERCUTANEOUS ENDOSCOPIC GASTROSTOMY (PEG) PLACEMENT;  Surgeon: [name], MD;  Location: [facility] ENDOSCOPY;  Service: Gastroenterology;  Laterality: N/A;   TONSILLECTOMY     TOTAL HIP ARTHROPLASTY     HPI:  Pt  is a 60 y.o. male with medical history significant for HTN, PTSD, diabetes, migraines, GERD, neurogenic bladder secondary to remote history of transverse myelitis w/ ETOH use, who presented to the ER with acute onset of altered mental status.  The patient was reportedly witnessed to have a seizure episode by his roommate per EMS.  When EMS found him he was awake but confused.  His roommate could not be contacted.  While the patient was in the ER he had a seizure episode with significantly elevated blood pressure that was up to 189/114 and later 250/160.  In the ED, pt reported that he drinks alcohol on a daily basis. He was very confused and intermittently following commands w/ MD.  Admitted w/ Dx of: Subacute right parietal cortical infarction with altered mental status/acute encephalopathy likely postictal with associated seizures.   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 2. No acute infarct.  CXR at admit: Patchy opacities in the right base may reflect infection or  aspiration.  2. Cardiomegaly with vascular  congestion but no definite overt  pulmonary edema.   OF NOTE: pt was recently admited 6-07/2020; he was d/c'd from skilled ST services d/t "pt was not able to participate in cognitive therapy.". He was also d/c'd on a Dysphagia level 1 (puree) diet w/ thin liquids; aspiration precautions.    Assessment / Plan / Recommendation  Clinical Impression  Pt appears to present w/ oropharyngeal phase dysphagia w/ overt, clinical s/s of aspiration w/ thin liquid trials. Suspect pt's presentation of oropharyngeal phase dysphagia is impacted by significantly declined Cognitive status; PRES, per Neurology. This can impact his overall awareness/timing of swallow and safety during po tasks which increases risk for aspiration, choking. Pt's risk for aspiration is present but can be reduced when following general aspiration precautions and using a modified diet consistency w/ Nectar liquids He required MOD+ verbal/ visual cues for during po tasks.   He was somnolent during exam but arousable w/ MOD cues. He was able to answer few simple questions including giving his name though is not oriented. He was able to follow few basic commands. He appears to be improving per chart notes but is still encephalopathic.  Pt consumed trials of ice chip, purees, and Nectar and thin liquids w/ overt clinical s/s of aspiration noted w/ thin liquids c/b overt coughing, min delayed response x1. No  overt clinical s/s of aspiration noted w/ remaining consistencies including Nectar liquids; no decline in vocal quality; no cough, and no decline in respiratory status during/post trials. O2 sats remained 99%. Oral phase was adequate for bolus management and oral clearing of the  liquid boluses given; though min+ increased Time noted during bolus management and organization of puree consistency prior to A-P transfer and swallow. Pt attempted self-feeding by holding Cup but required Mod-Max support and guidance d/t Cognitive decline. OM Exam  appeared grossly [facility] w/ No unilateral weakness noted. Some confusion of OM tasks and oral care; pt did not want to participate in oral care w/ OT.     Due to Cognitive decline and risk for aspiration, recommend initiation of a Dysphagia level 1 (Puree) w/ Nectar liquids; aspiration precautions; feeding assistance at all meal -- reduce distractions. Monitor for oral clearing b/t bites/sips. Engage pt to help hold cup when drinking. GERD precautions.  SLP Visit Diagnosis: Dysphagia, oropharyngeal phase (R13.12) (Cognitive impact/decline currently)    Aspiration Risk  Mild aspiration risk;Moderate aspiration risk;Risk for inadequate nutrition/hydration    Diet Recommendation   Dysphagia level 1 (Puree) w/ Nectar liquids; aspiration precautions; feeding assistance at all meal -- reduce distractions. Monitor for oral clearing b/t bites/sips. Engage pt to help hold cup when drinking. GERD precautions.   Medication Administration: Crushed with puree (as able)    Other  Recommendations Recommended Consults:  (Dietician) Oral Care Recommendations: Oral care BID;Oral care before and after PO;Staff/trained caregiver to provide oral care Other Recommendations: Order thickener from pharmacy;Prohibited food (jello, ice cream, thin soups);Remove water pitcher;Have oral suction available    Recommendations for follow up therapy are one component of a multi-disciplinary discharge planning process, led by the attending physician.  Recommendations may be updated based on patient status, additional functional criteria and insurance authorization.  Follow up Recommendations Skilled Nursing facility      Frequency and Duration min 3x week  2 weeks       Prognosis Prognosis for Safe Diet Advancement: Fair Barriers to Reach Goals: Cognitive deficits;Time post onset;Severity of deficits;Behavior      Swallow Study   General Date of Onset: 10/14/20 HPI: Pt  is a 60 y.o. 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  The patient was reportedly witnessed to have a seizure episode by his roommate per EMS.  When EMS found him he was awake but confused.  His roommate could not be contacted.  While the patient was in the ER he had a seizure episode with significantly elevated blood pressure that was up to 189/114 and later 250/160.  In the ED, pt reported that he drinks alcohol on a daily basis. He was very confused and intermittently following commands w/ MD.  Admitted w/ Dx of: Subacute right parietal cortical infarction with altered mental status/acute encephalopathy likely postictal with associated seizures.  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 2. No acute infarct.  CXR at admit: Patchy opacities in the right base may reflect infection or  aspiration.  2. Cardiomegaly with vascular congestion but no definite overt  pulmonary edema.  OF NOTE: pt was recently admited 6-07/2020; he was d/c'd from skilled ST services d/t "pt was not able to participate in cognitive therapy.". He was also d/c'd on a Dysphagia level 1 (puree) diet w/ thin liquids; aspiration precautions. Type of Study: Bedside Swallow Evaluation Previous Swallow Assessment: 6-07/2020 Diet Prior to this Study: Dysphagia 1 (puree);Thin liquids Temperature Spikes Noted: No (wbc 10.1) Respiratory Status: Room  air History of Recent Intubation: No Behavior/Cognition: Alert;Cooperative;Pleasant mood;Distractible;Requires cueing;Confused Oral Cavity Assessment: Dried secretions;Dry Oral Care Completed by SLP: Yes (attempted w/ OT) Oral Cavity - Dentition: Adequate natural dentition;Poor condition Vision:  (confusion) Self-Feeding Abilities:  Needs assist;Needs set up;Total assist Patient Positioning: Upright in bed (EOB w/ support) Baseline Vocal Quality: Low vocal intensity Volitional Cough: Cognitively unable to elicit Volitional Swallow: Unable to elicit    Oral/Motor/Sensory Function Overall Oral Motor/Sensory Function:  (impacted by Cognition but no Unilateral weakness noted during bolus management)   Ice Chips Ice chips: Impaired Presentation: Spoon (fed; 1) Oral Phase Impairments: Poor awareness of bolus Oral Phase Functional Implications: Prolonged oral transit Pharyngeal Phase Impairments:  (none)   Thin Liquid Thin Liquid: Impaired Presentation: Cup;Self Fed;Straw (fully supported; 3 trials) Oral Phase Impairments: Poor awareness of bolus Oral Phase Functional Implications:  (expectorated x1/3) Pharyngeal  Phase Impairments: Suspected delayed Swallow;Cough - Immediate (2/2 trials)    Nectar Thick Nectar Thick Liquid: Within functional limits Presentation: Self Fed;Straw (fully supported; 4 trials)   Honey Thick Honey Thick Liquid: Not tested   Puree Puree: Impaired Presentation: Spoon (fed; 3 trials) Oral Phase Impairments: Poor awareness of bolus Oral Phase Functional Implications: Prolonged oral transit (min) Pharyngeal Phase Impairments:  (none) Other Comments: cognition vs fatigue vs both   Solid     Solid: Not tested        Jerilynn Som, MS, CCC-SLP Speech Language Pathologist Rehab Services [phone] Wyatt Ball 10/16/2020,11:53 AM

## 2020-10-16 NOTE — Progress Notes (Signed)
Patient ID: Wyatt Garbe Kainz Montez Hageman., male   DOB: [birth year], 60 y.o.   MRN: [id number] [facility] PROGRESS NOTE  Wyatt Garbe Raulerson Montez Hageman. YQI:[id number] DOB: 1960-01-15 DOA: 10/14/2020 PCP: System, Provider Not In  HPI/Subjective: Patient starting on a diet today.  Does have a little bit of a cough.  Able to move his extremities but feels weak.  Able to talk today.  Slow with his responses.  Admitted with altered mental status and seizure and found to have Pres on MRI.  Objective: Vitals:   10/16/20 0900 10/16/20 1000  BP: (!) 146/66 129/65  Pulse: 83 69  Resp: 20 (!) 22  Temp:    SpO2: 99% 99%    Intake/Output Summary (Last 24 hours) at 10/16/2020 1236 Last data filed at 10/16/2020 1144 Gross per 24 hour  Intake 5717.49 ml  Output 2175 ml  Net 3542.49 ml   Filed Weights   10/14/20 0030  Weight: 63 kg    ROS: Review of Systems  Respiratory:  Positive for cough. Negative for shortness of breath.   Cardiovascular:  Negative for chest pain.  Gastrointestinal:  Negative for abdominal pain, nausea and vomiting.  Exam: Physical Exam HENT:     Head: Normocephalic.  Eyes:     General: Lids are normal.     Conjunctiva/sclera: Conjunctivae normal.     Comments: Patient slow with his eye movements.  Cardiovascular:     Rate and Rhythm: Normal rate and regular rhythm.     Heart sounds: Normal heart sounds, S1 normal and S2 normal.  Pulmonary:     Breath sounds: Normal breath sounds. No decreased breath sounds, wheezing, rhonchi or rales.  Abdominal:     Palpations: Abdomen is soft.     Tenderness: There is no abdominal tenderness.  Musculoskeletal:     Right lower leg: No swelling.     Left lower leg: No swelling.  Skin:    General: Skin is warm.     Findings: No rash.  Neurological:     Mental Status: He is alert.     Comments: Answer some questions.  Slow with following commands.  Able to straight leg raise and lift up his arms.      Scheduled Meds:   stroke:  mapping our early stages of recovery book   Does not apply Once   amLODipine  10 mg Oral Daily   aspirin  81 mg Oral Daily   atorvastatin  40 mg Oral Daily   Chlorhexidine Gluconate Cloth  6 each Topical Q0600   cloNIDine  0.3 mg Transdermal Weekly   docusate sodium  200 mg Oral BID   enoxaparin (LOVENOX) injection  40 mg Subcutaneous Q24H   finasteride  5 mg Oral Daily   gabapentin  100 mg Oral BID WC   gabapentin  300 mg Oral QHS   insulin aspart  0-9 Units Subcutaneous Q4H   latanoprost  1 drop Both Eyes QHS   loratadine  10 mg Oral Daily   metoprolol succinate  50 mg Oral Daily   pantoprazole  40 mg Oral Daily   prazosin  4 mg Oral QHS   QUEtiapine  25 mg Oral BID   traZODone  150 mg Oral QHS   Continuous Infusions:  sodium chloride 40 mL/hr at 10/16/20 1151   ampicillin-sulbactam (UNASYN) IV Stopped (10/16/20 1028)   levETIRAcetam 500 mg (10/16/20 0340)    Assessment/Plan:  PRES seen on MRI of the brain.  Blood pressure control.  IV drip stopped today.  Add Toprol to clonidine patch and lisinopril and continue to monitor.  As needed IV pushes for high blood pressure.  Possibility of right parietal cortical on infarction seen on CT scan but not MRI of the brain.  On aspirin, atorvastatin.  LDL 67.  Started on a diet and started talking and following commands today. Seizure on Keppra Hypertensive urgency.  Was on Cardene drip but that was discontinued today.  Continue clonidine patch and lisinopril and Norvasc.  Toprol XL added today. Aspiration pneumonia with leukocytosis on Unasyn.  Last white blood cell count in the normal range. Drop in hemoglobin with no signs of bleeding.  Decrease IV fluid hydration. Urinary retention.  On Proscar.  Has Foley catheter in. Chronic kidney disease stage IIIa Neuropathy on gabapentin Depression on Seroquel Previous history of anoxic brain injury back in June 2022.       Code Status:     Code Status Orders  (From admission, onward)            Start     Ordered   10/14/20 0205  Do not attempt resuscitation (DNR)  Continuous       Question Answer Comment  In the event of cardiac or respiratory ARREST Do not call a "code blue"   In the event of cardiac or respiratory ARREST Do not perform Intubation, CPR, defibrillation or ACLS   In the event of cardiac or respiratory ARREST Use medication by any route, position, wound care, and other measures to relive pain and suffering. May use oxygen, suction and manual treatment of airway obstruction as needed for comfort.      10/14/20 0212           Code Status History     Date Active Date Inactive Code Status Order ID Comments User Context   06/27/2020 1654 08/02/2020 1609 DNR [id number]  Lurene Shadow, MD Inpatient   06/04/2020 2029 06/27/2020 1654 Full Code [id number]  Andris Baumann, MD ED      Family Communication: Updated patient's brother on the phone Disposition Plan: Status is: Inpatient  Dispo: The patient is from: Home              Anticipated d/c is to: Likely rehab              Patient currently tapered to off nicardipine drip and starting oral medications   Difficult to place patient.  Hopefully not  Consultants: Neurology  Time spent: 28 minutes, case discussed with nursing staff and neurology  Makalya Nave [facility]  [facility]

## 2020-10-17 DIAGNOSIS — G629 Polyneuropathy, unspecified: Secondary | ICD-10-CM

## 2020-10-17 LAB — GLUCOSE, CAPILLARY
Glucose-Capillary: 173 mg/dL — ABNORMAL HIGH (ref 70–99)
Glucose-Capillary: 188 mg/dL — ABNORMAL HIGH (ref 70–99)
Glucose-Capillary: 216 mg/dL — ABNORMAL HIGH (ref 70–99)
Glucose-Capillary: 251 mg/dL — ABNORMAL HIGH (ref 70–99)

## 2020-10-17 MED ORDER — HYDRALAZINE HCL 20 MG/ML IJ SOLN
10.0000 mg | Freq: Four times a day (QID) | INTRAMUSCULAR | Status: DC | PRN
  Administered 2020-10-17 – 2020-10-18 (×3): 10 mg via INTRAVENOUS
  Filled 2020-10-17 (×3): qty 1

## 2020-10-17 MED ORDER — MORPHINE SULFATE ER 15 MG PO TBCR
15.0000 mg | EXTENDED_RELEASE_TABLET | Freq: Two times a day (BID) | ORAL | Status: DC
  Administered 2020-10-17 – 2020-10-20 (×7): 15 mg via ORAL
  Filled 2020-10-17 (×7): qty 1

## 2020-10-17 MED ORDER — NEPRO/CARBSTEADY PO LIQD
237.0000 mL | Freq: Three times a day (TID) | ORAL | Status: DC
  Administered 2020-10-17 – 2020-10-20 (×10): 237 mL via ORAL

## 2020-10-17 MED ORDER — LISINOPRIL 20 MG PO TABS
40.0000 mg | ORAL_TABLET | Freq: Every day | ORAL | Status: DC
  Administered 2020-10-17: 40 mg via ORAL
  Filled 2020-10-17: qty 2

## 2020-10-17 MED ORDER — INSULIN ASPART 100 UNIT/ML IJ SOLN
0.0000 [IU] | Freq: Three times a day (TID) | INTRAMUSCULAR | Status: DC
  Administered 2020-10-17: 2 [IU] via SUBCUTANEOUS
  Administered 2020-10-17: 5 [IU] via SUBCUTANEOUS
  Administered 2020-10-17 (×2): 3 [IU] via SUBCUTANEOUS
  Administered 2020-10-18: 2 [IU] via SUBCUTANEOUS
  Administered 2020-10-18: 17:00:00 3 [IU] via SUBCUTANEOUS
  Administered 2020-10-18: 2 [IU] via SUBCUTANEOUS
  Administered 2020-10-18: 1 [IU] via SUBCUTANEOUS
  Administered 2020-10-19 (×2): 2 [IU] via SUBCUTANEOUS
  Administered 2020-10-19: 3 [IU] via SUBCUTANEOUS
  Administered 2020-10-19: 17:00:00 1 [IU] via SUBCUTANEOUS
  Administered 2020-10-20 (×2): 2 [IU] via SUBCUTANEOUS
  Filled 2020-10-17 (×14): qty 1

## 2020-10-17 MED ORDER — AMOXICILLIN-POT CLAVULANATE 875-125 MG PO TABS
1.0000 | ORAL_TABLET | Freq: Two times a day (BID) | ORAL | Status: AC
  Administered 2020-10-17 – 2020-10-19 (×5): 1 via ORAL
  Filled 2020-10-17 (×6): qty 1

## 2020-10-17 MED ORDER — LABETALOL HCL 100 MG PO TABS
100.0000 mg | ORAL_TABLET | Freq: Three times a day (TID) | ORAL | Status: DC
  Filled 2020-10-17 (×3): qty 1

## 2020-10-17 MED ORDER — OXYCODONE-ACETAMINOPHEN 5-325 MG PO TABS
1.0000 | ORAL_TABLET | Freq: Four times a day (QID) | ORAL | Status: DC | PRN
  Administered 2020-10-18 – 2020-10-19 (×2): 1 via ORAL
  Filled 2020-10-17 (×2): qty 1

## 2020-10-17 MED ORDER — LABETALOL HCL 200 MG PO TABS
200.0000 mg | ORAL_TABLET | Freq: Two times a day (BID) | ORAL | Status: DC
  Administered 2020-10-17 – 2020-10-18 (×2): 200 mg via ORAL
  Filled 2020-10-17 (×3): qty 1

## 2020-10-17 MED ORDER — LEVETIRACETAM 500 MG PO TABS
500.0000 mg | ORAL_TABLET | Freq: Two times a day (BID) | ORAL | Status: DC
  Administered 2020-10-17 – 2020-10-20 (×6): 500 mg via ORAL
  Filled 2020-10-17 (×8): qty 1

## 2020-10-17 NOTE — Progress Notes (Signed)
Patient ID: Wyatt Ball., male   DOB: 07-Apr-1960, 60 y.o.   MRN: [id number] [facility] PROGRESS NOTE  Wyatt Garbe Crosland Montez Ball. [id number] DOB: 1960/07/31 DOA: 10/14/2020 PCP: System, Provider Not In  HPI/Subjective: Patient talking a lot better today than even yesterday.  Upset about his diet being dysphagia diet.  Hopefully can upgrade diet soon.  Patient's blood pressure up this morning.  As per nursing staff received IV meds overnight.  Admitted with pres.  Patient takes chronic pain medications at home.  Objective: Vitals:   10/17/20 1035 10/17/20 1100  BP: (!) 175/77 (!) 169/92  Pulse:  81  Resp:  (!) 25  Temp:    SpO2:  100%    Intake/Output Summary (Last 24 hours) at 10/17/2020 1231 Last data filed at 10/17/2020 1100 Gross per 24 hour  Intake 952.54 ml  Output 3625 ml  Net -2672.46 ml   Filed Weights   10/14/20 0030  Weight: 63 kg    ROS: Review of Systems  Respiratory:  Negative for shortness of breath.   Cardiovascular:  Negative for chest pain.  Gastrointestinal:  Negative for abdominal pain, nausea and vomiting.  Exam: Physical Exam HENT:     Head: Normocephalic.     Mouth/Throat:     Pharynx: No oropharyngeal exudate.  Eyes:     General: Lids are normal.     Conjunctiva/sclera: Conjunctivae normal.  Cardiovascular:     Rate and Rhythm: Normal rate and regular rhythm.     Heart sounds: Normal heart sounds, S1 normal and S2 normal.  Pulmonary:     Breath sounds: Normal breath sounds. No decreased breath sounds, wheezing, rhonchi or rales.  Abdominal:     Palpations: Abdomen is soft.     Tenderness: There is no abdominal tenderness.  Musculoskeletal:     Right lower leg: No swelling.     Left lower leg: No swelling.  Skin:    General: Skin is warm.     Findings: No rash.  Neurological:     Mental Status: He is alert.     Comments: Answers all questions appropriately.  Talking more fluently today.  Feeding himself.       Scheduled Meds:   stroke: mapping our early stages of recovery book   Does not apply Once   amLODipine  10 mg Oral Daily   amoxicillin-clavulanate  1 tablet Oral Q12H   aspirin  81 mg Oral Daily   atorvastatin  40 mg Oral Daily   Chlorhexidine Gluconate Cloth  6 each Topical Q0600   cloNIDine  0.3 mg Transdermal Weekly   docusate sodium  200 mg Oral BID   enoxaparin (LOVENOX) injection  40 mg Subcutaneous Q24H   feeding supplement (NEPRO CARB STEADY)  237 mL Oral TID BM   finasteride  5 mg Oral Daily   gabapentin  100 mg Oral BID WC   gabapentin  300 mg Oral QHS   insulin aspart  0-9 Units Subcutaneous TID AC & HS   labetalol  200 mg Oral BID   latanoprost  1 drop Both Eyes QHS   levETIRAcetam  500 mg Oral BID   lisinopril  40 mg Oral Daily   loratadine  10 mg Oral Daily   morphine  15 mg Oral Q12H   pantoprazole  40 mg Oral Daily   prazosin  4 mg Oral QHS   QUEtiapine  25 mg Oral BID   traZODone  150 mg Oral QHS   Continuous Infusions:  ampicillin-sulbactam (UNASYN)  IV 1.5 g (10/17/20 1038)    Assessment/Plan:  Pres seen on MRI of the brain.  Continue to try to get blood pressure under better control.  Added labetalol today and lisinopril today.  Already on clonidine patch and Norvasc.  As needed IV pushes.  Possibility of right parietal cortical infarction seen on CT scan of the brain but MRI did not comment on this.  On aspirin, atorvastatin.  LDL 67.  Hopefully can advance diet today. Seizure on Keppra Hypertensive urgency.  Initially was on Cardene drip now on clonidine patch, Norvasc.  Toprol discontinued and labetalol started.  Added lisinopril. Aspiration pneumonia with leukocytosis.  Switch Unasyn over to Augmentin Drop in hemoglobin.  No signs of bleeding.  Discontinue IV fluids. Urinary retention.  History of transverse myelitis does self catheterizations at home.  Has Foley catheter in here.  On Proscar. Chronic kidney disease stage IIIa Chronic pain and  neuropathy.  Restart MS Contin, Percocet, Flexeril and already on gabapentin Depression on Seroquel Previous history of anoxic brain injury back in June 2022 Continue PT and OT evaluation    Code Status:     Code Status Orders  (From admission, onward)           Start     Ordered   10/14/20 0205  Do not attempt resuscitation (DNR)  Continuous       Question Answer Comment  In the event of cardiac or respiratory ARREST Do not call a "code blue"   In the event of cardiac or respiratory ARREST Do not perform Intubation, CPR, defibrillation or ACLS   In the event of cardiac or respiratory ARREST Use medication by any route, position, wound care, and other measures to relive pain and suffering. May use oxygen, suction and manual treatment of airway obstruction as needed for comfort.      10/14/20 0212           Code Status History     Date Active Date Inactive Code Status Order ID Comments User Context   06/27/2020 1654 08/02/2020 1609 DNR [id number]  Lurene Shadow, MD Inpatient   06/04/2020 2029 06/27/2020 1654 Full Code [id number]  Andris Baumann, MD ED      Family Communication: Updated brother on the phone Disposition Plan: Status is: Inpatient  Dispo: The patient is from: Home              Anticipated d/c is to: Likely rehab              Patient currently blood pressure still high today requiring IV medication.  Titrating oral medication   Difficult to place patient.  No.  Consultants: Neurology  Antibiotics: Unasyn being switched over to Augmentin  Time spent: 27 minutes  Jaimy Kliethermes [employer]

## 2020-10-17 NOTE — Progress Notes (Signed)
Subjective: Markedly improved  Exam: Vitals:   10/17/20 1035 10/17/20 1100  BP: (!) 175/77 (!) 169/92  Pulse:  81  Resp:  (!) 25  Temp:    SpO2:  100%   Gen: In bed, NAD Resp: non-labored breathing, no acute distress Abd: soft, nt Neck: Supple  Neuro: MS: He is awake, alert, conversant.  He is oriented to month, gives the year as 2021. 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  He continues to improve.   He does have some mild right-sided deficits, and I am concerned that he may have had some degree of infarction secondary to the pres.  This was suggested in the initial MRI, but I still do not think it is definite.  In any case, I would not modify current management.  Recommendations: 1) continue strict blood pressure control, less than 140 systolic 2) continue Keppra 500 mg twice daily, I would continue this until outpatient follow-up with neurology, I would favor continuing it for a few months 3) PT, OT, ST 4) care from this point forward is purely supportive other than the above, neurology will be available on an as-needed basis, but I would expect him to continue to gradually improve over time.   Ritta Slot, MD [facility] [phone]  If 7pm- 7am, please page neurology on call as listed in AMION. 10/17/2020  12:56 PM

## 2020-10-17 NOTE — Progress Notes (Signed)
Occupational Therapy Treatment Patient Details Name: Wyatt Ball. MRN: [id number] DOB: [birth year] Today's Date: 10/17/2020   History of present illness Pt is a 60 y/o M admitted on 10/14/20 with c/c of AMS & seizures. While in the ED pt had a seizure episode with significantly elevated BP. Pt found to have subacute R parietal corical infarction with AMS/acute encephalopathy likely postictal with associated seizures. PMH: Dm2, HTN, anxiety, transverse myelitis   OT comments  Mr Mierediercks was seen for OT/PT co-treatment on this date. Upon arrival to room pt reclined in bed, greets therapists, and states eager for OOB to chair. Pt requires CGA sup>sit. MAX A don B socks seated EOB - pt requires no physical assist to thread socks however assist for heavy posterior lean. MIN A don gown seated EOB - assist for L lateral lean. Pt demonstrates poor insight into deficits and L midline shift. MOD A x2 + RW sit<>stand improving to MIN A x2 + RW for SPT bed>chair. Pt making good progress toward goals. Pt continues to benefit from skilled OT services to maximize return to PLOF. Will continue to follow POC. Discharge recommendation remains appropriate, plan to re-assess goals next session 2/2 pt progress.     Recommendations for follow up therapy are one component of a multi-disciplinary discharge planning process, led by the attending physician.  Recommendations may be updated based on patient status, additional functional criteria and insurance authorization.    Follow Up Recommendations  SNF;Supervision/Assistance - 24 hour    Equipment Recommendations  Other (comment)    Recommendations for Other Services      Precautions / Restrictions Precautions Precautions: Fall Restrictions Weight Bearing Restrictions: No       Mobility Bed Mobility Overal bed mobility: Needs Assistance Bed Mobility: Supine to Sit     Supine to sit: Min guard;HOB elevated          Transfers Overall  transfer level: Needs assistance Equipment used: Rolling walker (2 wheeled) Transfers: Sit to/from [employer] Sit to Stand: Mod assist;+2 physical assistance Stand pivot transfers: Min assist;+2 physical assistance            Balance Overall balance assessment: Needs assistance Sitting-balance support: Feet supported;No upper extremity supported Sitting balance-Leahy Scale: Poor   Postural control: Posterior lean;Left lateral lean Standing balance support: Bilateral upper extremity supported;During functional activity                               ADL either performed or assessed with clinical judgement   ADL Overall ADL's : Needs assistance/impaired                                       General ADL Comments: MAX A don B socks seated EOB - assist for heavy posterior lean. MIN A don gown seated EOB - assist for sitting balance. MIN A x2 SPT bed>chair      Cognition Arousal/Alertness: Awake/alert Behavior During Therapy: WFL for tasks assessed/performed Overall Cognitive Status: Impaired/Different from baseline Area of Impairment: Following commands;Awareness;Problem solving;Safety/judgement                       Following Commands: Follows one step commands with increased time Safety/Judgement: Decreased awareness of safety;Decreased awareness of deficits Awareness: Emergent Problem Solving: Slow processing;Requires verbal cues;Requires tactile cues General Comments: Talking appropriatley, constant cues  to find midline        Exercises Exercises: Other exercises Other Exercises Other Exercises: Pt educated re: OT role, DME recs, d/c recs, falls prevention Other Exercises: LBD, UBD, sup<>sit, sit<>stand, sitting/standing balance/tolerance      General Comments SEATED: BP 136/79, MAP 96. SEATED following t.f: BP 156/72, MAP 92    Pertinent Vitals/ Pain       Pain Assessment: No/denies pain   Frequency  Min  1X/week        Progress Toward Goals  OT Goals(current goals can now be found in the care plan section)  Progress towards OT goals: Progressing toward goals;OT to reassess next treatment  Acute Rehab OT Goals Patient Stated Goal: to return to PLOF OT Goal Formulation: With patient Time For Goal Achievement: 10/28/20 Potential to Achieve Goals: Fair ADL Goals Pt Will Perform Eating: with supervision;bed level Pt Will Perform Grooming: with min assist;sitting Pt Will Transfer to Toilet: with max assist;stand pivot transfer;bedside commode  Plan Discharge plan remains appropriate;Frequency remains appropriate    Co-evaluation    PT/OT/SLP Co-Evaluation/Treatment: Yes Reason for Co-Treatment: Necessary to address cognition/behavior during functional activity;For patient/therapist safety;To address functional/ADL transfers PT goals addressed during session: Mobility/safety with mobility;Balance OT goals addressed during session: ADL's and self-care      AM-PAC OT "6 Clicks" Daily Activity     Outcome Measure   Help from another person eating meals?: A Little Help from another person taking care of personal grooming?: A Little Help from another person toileting, which includes using toliet, bedpan, or urinal?: A Lot Help from another person bathing (including washing, rinsing, drying)?: A Lot Help from another person to put on and taking off regular upper body clothing?: A Little Help from another person to put on and taking off regular lower body clothing?: A Lot 6 Click Score: 15    End of Session Equipment Utilized During Treatment: Rolling walker;Gait belt  OT Visit Diagnosis: Other abnormalities of gait and mobility (R26.89);Cognitive communication deficit (R41.841);Other symptoms and signs involving cognitive function Symptoms and signs involving cognitive functions: Other cerebrovascular disease   Activity Tolerance Patient tolerated treatment well   Patient Left  in chair;with call bell/phone within reach   Nurse Communication Mobility status        Time: 7412-8786 OT Time Calculation (min): 24 min  Charges: OT General Charges $OT Visit: 1 Visit OT Treatments $Self Care/Home Management : 8-22 mins  Kathie Dike, M.S. OTR/L  10/17/20, 4:08 PM  ascom [phone]

## 2020-10-17 NOTE — Progress Notes (Signed)
Patient alert and oriented to self. In NSR, patient given PRN hydralazine and labetalol this morning for elevated blood pressures, PRN morphine for pain. MD aware. Pressures have since been below 140s, controlled with PO medications. Tylenol given for headache, patient stated effective. Speech therapy advanced diet to regular, patient tolerated well.

## 2020-10-17 NOTE — Progress Notes (Signed)
Physical Therapy Treatment Patient Details Name: Wyatt Ball. MRN: [id number] DOB: [birth year] Today's Date: 10/17/2020   History of Present Illness Pt is a 60 y/o M admitted on 10/14/20 with c/c of AMS & seizures. While in the ED pt had a seizure episode with significantly elevated BP. Pt found to have subacute R parietal corical infarction with AMS/acute encephalopathy likely postictal with associated seizures. PMH: Dm2, HTN, anxiety, transverse myelitis    PT Comments    Pt received supine in bed, more alert than previous session and conversational. Pt agreeable to therapy. Bed mobility improved to CGA with VC to encourage independence. Pt does demo mild right sided neglect with a leftward trunk lean in both sitting and standing. When asked, pt felt like he was leaning towards the right side. PT provided VC and TC to help pt find midline. MAX A required to maintain balance while donning socks due to posterior lean. MIN-MOD A x2 for STS and stand pivot. Would like to attempt STS and transfer with assist of 1 person. Will need +2 for chair follow when assessing ambulation (when safe with BP restrictions). BP was monitored throughout session with systolic increase >140 after transfer. Would benefit from skilled PT to address above deficits and promote optimal return to PLOF.   Recommendations for follow up therapy are one component of a multi-disciplinary discharge planning process, led by the attending physician.  Recommendations may be updated based on patient status, additional functional criteria and insurance authorization.  Follow Up Recommendations  SNF     Equipment Recommendations  None recommended by PT    Recommendations for Other Services       Precautions / Restrictions Precautions Precautions: Fall Restrictions Weight Bearing Restrictions: No Other Position/Activity Restrictions: strict BP cutoff of 140 per neurology     Mobility  Bed Mobility Overal bed  mobility: Needs Assistance Bed Mobility: Supine to Sit     Supine to sit: Min guard;HOB elevated     General bed mobility comments: Improved command following. VC for sequencing and mechanics. CGA for safety and steadying once sitting    Transfers Overall transfer level: Needs assistance Equipment used: Rolling walker (2 wheeled) Transfers: Sit to/from [employer] Sit to Stand: Mod assist;+2 physical assistance Stand pivot transfers: Min assist;+2 physical assistance       General transfer comment: MOD A x2 to lift during STS with pt leaning left, unaware. Increased steadying assist and partial management of RW during SPT. VC on stepping and sequencing.  Ambulation/Gait             General Gait Details: deferred - BP elevated >140   Stairs             Wheelchair Mobility    Modified Rankin (Stroke Patients Only)       Balance Overall balance assessment: Needs assistance Sitting-balance support: Feet supported;No upper extremity supported Sitting balance-Leahy Scale: Poor Sitting balance - Comments: MIN A initially to assist pt with finiding midline. Improved to CGA when focused, with increased trunk sway towards left and posterior. Pt required MAX A to maintain balance while donning socks due to posterior lean. Postural control: Posterior lean;Left lateral lean Standing balance support: Bilateral upper extremity supported;During functional activity Standing balance-Leahy Scale: Poor Standing balance comment: Required MIN A to steady and BUE support of RW                            Cognition Arousal/Alertness:  Awake/alert Behavior During Therapy: WFL for tasks assessed/performed Overall Cognitive Status: Impaired/Different from baseline Area of Impairment: Following commands;Awareness;Problem solving;Safety/judgement                       Following Commands: Follows one step commands with increased  time Safety/Judgement: Decreased awareness of safety;Decreased awareness of deficits Awareness: Emergent Problem Solving: Slow processing;Requires verbal cues;Requires tactile cues General Comments: Talking appropriatley, constant cues to find midline      Exercises Other Exercises Other Exercises: Pt educated re: OT role, DME recs, d/c recs, falls prevention Other Exercises: LBD, UBD, sup<>sit, sit<>stand, sitting/standing balance/tolerance    General Comments General comments (skin integrity, edema, etc.): Seated: BP 136/79, MAP 96. Seated following transfer: BP 156/72, MAP 92. RN aware.      Pertinent Vitals/Pain Pain Assessment: No/denies pain    Home Living                      Prior Function            PT Goals (current goals can now be found in the care plan section) Acute Rehab PT Goals Patient Stated Goal: to return to PLOF    Frequency    Min 2X/week      PT Plan      Co-evaluation PT/OT/SLP Co-Evaluation/Treatment: Yes Reason for Co-Treatment: Necessary to address cognition/behavior during functional activity;For patient/therapist safety;To address functional/ADL transfers PT goals addressed during session: Mobility/safety with mobility;Balance OT goals addressed during session: ADL's and self-care      AM-PAC PT "6 Clicks" Mobility   Outcome Measure  Help needed turning from your back to your side while in a flat bed without using bedrails?: A Little Help needed moving from lying on your back to sitting on the side of a flat bed without using bedrails?: A Little Help needed moving to and from a bed to a chair (including a wheelchair)?: A Lot Help needed standing up from a chair using your arms (e.g., wheelchair or bedside chair)?: A Little Help needed to walk in hospital room?: A Lot Help needed climbing 3-5 steps with a railing? : Total 6 Click Score: 14    End of Session Equipment Utilized During Treatment: Gait belt Activity  Tolerance: Patient tolerated treatment well Patient left: with call bell/phone within reach;in chair Nurse Communication: Mobility status PT Visit Diagnosis: Unsteadiness on feet (R26.81);Muscle weakness (generalized) (M62.81);Difficulty in walking, not elsewhere classified (R26.2);Other abnormalities of gait and mobility (R26.89)     Time: 5038-8828 PT Time Calculation (min) (ACUTE ONLY): 24 min  Charges:  $Neuromuscular Re-education: 8-22 mins                     Basilia Jumbo PT, DPT 10/17/20 5:21 PM [phone]

## 2020-10-17 NOTE — Progress Notes (Signed)
Speech Language Pathology Treatment: Dysphagia  Patient Details Name: Wyatt Ball. MRN: [id number] DOB: 1960/08/31 Today's Date: 10/17/2020 Time: 0630-1601 SLP Time Calculation (min) (ACUTE ONLY): 45 min  Assessment / Plan / Recommendation Clinical Impression  Pt seen for ongoing assessment of swallowing. He is alert, verbally responsive and engaged in conversation w/ SLP today. Pt is on RA; wbc wnl. He is eager to eat/drink a regular diet. Pt presents w/ a much improved Cognitive/mental status -- he is alert, oriented to self, place, and general time. He gave accurate information re: himself and described events including his Dtr's pending marriage and wanting to be there for it. He had questions about his DNR status which were diverted to the Nurse. He set up his Lunch tray independently and fed self.  Pt explained general aspiration precautions and agreed verbally to the need for following them especially sitting upright for all oral intake -- support given behind the back more for full upright sitting d/t pt being min slouched in bed. He fed himself Lunch meal items of soft solids and thin liquids via cup. No overt clinical s/s of aspiration were noted w/ any consistency; respiratory status remained calm and unlabored, vocal quality clear b/t trials. Oral phase appeared grossly [facility] for bolus management and timely A-P transfer for swallowing; min increased mastication time needed d/t pt missing many Dentition. Oral clearing achieved w/ all consistencies. Pt/NSG denied any swallowing deficits w/ previous diet.    Pt appears at reduced risk for aspriation when following general aspiration precautions. Recommend a Regular diet w/ cut meats for ease of soft foods w/ gravies added to moisten foods; Thin liquids via Cup. Recommend general aspiration precautions; Pills Whole in Puree; tray setup and positioning assistance for meals. REFLUX precautions d/t pt's baseline. ST services will f/u w/  toleration of upgraded diet. NSG updated. Precautions posted at bedside - No straws.     HPI HPI: Pt  is a 60 y.o. male with medical history significant for HTN, PTSD, diabetes, migraines, GERD, neurogenic bladder secondary to remote history of transverse myelitis w/ ETOH use, who presented to the ER with acute onset of altered mental status.  The patient was reportedly witnessed to have a seizure episode by his roommate per EMS.  When EMS found him he was awake but confused.  His roommate could not be contacted.  While the patient was in the ER he had a seizure episode with significantly elevated blood pressure that was up to 189/114 and later 250/160.  In the ED, pt reported that he drinks alcohol on a daily basis. He was very confused and intermittently following commands w/ MD.  Admitted w/ Dx of: Subacute right parietal cortical infarction with altered mental status/acute encephalopathy likely postictal with associated seizures.  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 2. No acute infarct.  CXR at admit: Patchy opacities in the right base may reflect infection or  aspiration.  2. Cardiomegaly with vascular congestion but no definite overt  pulmonary edema.  OF NOTE: pt was recently admited 6-07/2020; he was d/c'd from skilled ST services d/t "pt was not able to participate in cognitive therapy.". He was also d/c'd on a Dysphagia level 1 (puree) diet w/ thin liquids; aspiration precautions.      SLP Plan  Continue with current plan of care (po check x1)      Recommendations for follow up therapy are  one component of a multi-disciplinary discharge planning process, led by the attending physician.  Recommendations may be updated based on patient status, additional functional criteria and insurance authorization.    Recommendations  Diet recommendations: Regular;Thin  liquid Liquids provided via: Cup Medication Administration: Whole meds with puree (for easier, safer swallowing) Supervision: Patient able to self feed Compensations: Minimize environmental distractions;Slow rate;Small sips/bites;Lingual sweep for clearance of pocketing;Follow solids with liquid Postural Changes and/or Swallow Maneuvers: Out of bed for meals;Seated upright 90 degrees;Upright 30-60 min after meal                Oral Care Recommendations: Oral care BID;Patient independent with oral care Follow up Recommendations: Skilled Nursing facility (TBD) SLP Visit Diagnosis: Dysphagia, unspecified (R13.10) Plan: Continue with current plan of care (po check x1)       GO                 Jerilynn Som, MS, CCC-SLP Speech Language Pathologist Rehab Services [phone] [facility]  10/17/2020, 1:14 PM

## 2020-10-18 ENCOUNTER — Other Ambulatory Visit: Payer: Self-pay

## 2020-10-18 ENCOUNTER — Encounter: Payer: Self-pay | Admitting: Family Medicine

## 2020-10-18 DIAGNOSIS — R5383 Other fatigue: Secondary | ICD-10-CM

## 2020-10-18 DIAGNOSIS — N1832 Chronic kidney disease, stage 3b: Secondary | ICD-10-CM

## 2020-10-18 LAB — CBC
HCT: 29.5 % — ABNORMAL LOW (ref 39.0–52.0)
Hemoglobin: 9.7 g/dL — ABNORMAL LOW (ref 13.0–17.0)
MCH: 29.9 pg (ref 26.0–34.0)
MCHC: 32.9 g/dL (ref 30.0–36.0)
MCV: 91 fL (ref 80.0–100.0)
Platelets: 221 10*3/uL (ref 150–400)
RBC: 3.24 MIL/uL — ABNORMAL LOW (ref 4.22–5.81)
RDW: 12.3 % (ref 11.5–15.5)
WBC: 7.4 10*3/uL (ref 4.0–10.5)
nRBC: 0 % (ref 0.0–0.2)

## 2020-10-18 LAB — BASIC METABOLIC PANEL
Anion gap: 5 (ref 5–15)
BUN: 26 mg/dL — ABNORMAL HIGH (ref 8–23)
CO2: 25 mmol/L (ref 22–32)
Calcium: 8.9 mg/dL (ref 8.9–10.3)
Chloride: 106 mmol/L (ref 98–111)
Creatinine, Ser: 1.76 mg/dL — ABNORMAL HIGH (ref 0.61–1.24)
GFR, Estimated: 43 mL/min — ABNORMAL LOW (ref >60)
Glucose, Bld: 190 mg/dL — ABNORMAL HIGH (ref 70–99)
Potassium: 3.6 mmol/L (ref 3.5–5.1)
Sodium: 136 mmol/L (ref 135–145)

## 2020-10-18 LAB — GLUCOSE, CAPILLARY
Glucose-Capillary: 229 mg/dL — ABNORMAL HIGH (ref 70–99)
Glucose-Capillary: 171 mg/dL — ABNORMAL HIGH (ref 70–99)
Glucose-Capillary: 222 mg/dL — ABNORMAL HIGH (ref 70–99)
Glucose-Capillary: 269 mg/dL — ABNORMAL HIGH (ref 70–99)
Glucose-Capillary: 261 mg/dL — ABNORMAL HIGH (ref 70–99)
Glucose-Capillary: 163 mg/dL — ABNORMAL HIGH (ref 70–99)

## 2020-10-18 MED ORDER — INSULIN ASPART 100 UNIT/ML IJ SOLN
2.0000 [IU] | Freq: Three times a day (TID) | INTRAMUSCULAR | Status: DC
  Administered 2020-10-18 – 2020-10-20 (×6): 2 [IU] via SUBCUTANEOUS
  Filled 2020-10-18 (×6): qty 1

## 2020-10-18 MED ORDER — INFLUENZA VAC SPLIT QUAD 0.5 ML IM SUSY: 0.5000 mL | PREFILLED_SYRINGE | INTRAMUSCULAR | Status: DC

## 2020-10-18 MED ORDER — LISINOPRIL 20 MG PO TABS
20.0000 mg | ORAL_TABLET | Freq: Two times a day (BID) | ORAL | Status: DC
  Administered 2020-10-18 (×2): 20 mg via ORAL
  Filled 2020-10-18 (×2): qty 1

## 2020-10-18 MED ORDER — LABETALOL HCL 200 MG PO TABS
200.0000 mg | ORAL_TABLET | Freq: Three times a day (TID) | ORAL | Status: DC
  Administered 2020-10-18 – 2020-10-20 (×6): 200 mg via ORAL
  Filled 2020-10-18 (×9): qty 1

## 2020-10-18 MED ORDER — QUETIAPINE FUMARATE 25 MG PO TABS
25.0000 mg | ORAL_TABLET | Freq: Every day | ORAL | Status: DC
  Administered 2020-10-19: 22:00:00 25 mg via ORAL
  Filled 2020-10-18: qty 1

## 2020-10-18 NOTE — Progress Notes (Signed)
Patient ID: Gaspar Garbe Naumann Montez Hageman., male   DOB: 23-Dec-1960, 60 y.o.   MRN: [id number] [facility] PROGRESS NOTE  Gaspar Garbe Konkel Montez Hageman. HGD:[id number] DOB: 18-Jun-1960 DOA: 10/14/2020 PCP: System, Provider Not In  HPI/Subjective: Patient awakened from sleep.  Stated sometimes he does not sleep well at night.  I restarted pain medications yesterday.  Objective: Vitals:   10/18/20 1158 10/18/20 1200  BP: (!) 141/69 (!) 143/78  Pulse:    Resp:  17  Temp:  98.7 F (37.1 C)  SpO2:  97%    Intake/Output Summary (Last 24 hours) at 10/18/2020 1442 Last data filed at 10/18/2020 1400 Gross per 24 hour  Intake 880.05 ml  Output 5666 ml  Net -4785.95 ml   Filed Weights   10/14/20 0030  Weight: 63 kg    ROS: Review of Systems  Respiratory:  Negative for shortness of breath.   Cardiovascular:  Negative for chest pain.  Gastrointestinal:  Negative for abdominal pain.  Exam: Physical Exam HENT:     Head: Normocephalic.     Mouth/Throat:     Pharynx: No oropharyngeal exudate.  Eyes:     General: Lids are normal.     Conjunctiva/sclera: Conjunctivae normal.  Cardiovascular:     Rate and Rhythm: Normal rate and regular rhythm.     Heart sounds: Normal heart sounds, S1 normal and S2 normal.  Pulmonary:     Breath sounds: No decreased breath sounds, wheezing, rhonchi or rales.  Abdominal:     Palpations: Abdomen is soft.     Tenderness: There is no abdominal tenderness.  Musculoskeletal:     Right lower leg: No swelling.     Left lower leg: No swelling.  Skin:    General: Skin is warm.     Findings: No rash.  Neurological:     Mental Status: He is lethargic.      Scheduled Meds:   stroke: mapping our early stages of recovery book   Does not apply Once   amLODipine  10 mg Oral Daily   amoxicillin-clavulanate  1 tablet Oral Q12H   aspirin  81 mg Oral Daily   atorvastatin  40 mg Oral Daily   Chlorhexidine Gluconate Cloth  6 each Topical Q0600   cloNIDine  0.3 mg  Transdermal Weekly   docusate sodium  200 mg Oral BID   enoxaparin (LOVENOX) injection  40 mg Subcutaneous Q24H   feeding supplement (NEPRO CARB STEADY)  237 mL Oral TID BM   finasteride  5 mg Oral Daily   gabapentin  100 mg Oral BID WC   gabapentin  300 mg Oral QHS   [START ON 10/19/2020] influenza vac split quadrivalent PF  0.5 mL Intramuscular Tomorrow-1000   insulin aspart  0-9 Units Subcutaneous TID AC & HS   labetalol  200 mg Oral TID   latanoprost  1 drop Both Eyes QHS   levETIRAcetam  500 mg Oral BID   lisinopril  20 mg Oral BID   loratadine  10 mg Oral Daily   morphine  15 mg Oral Q12H   pantoprazole  40 mg Oral Daily   prazosin  4 mg Oral QHS   QUEtiapine  25 mg Oral BID   traZODone  150 mg Oral QHS     Assessment/Plan:  Pressing on MRI of the brain.  Blood pressure up overnight last night and again this afternoon.  Increase labetalol to 200 mg 3 times daily.  Patient already on clonidine patch, Norvasc, lisinopril and Minipress. Possibility of  right parietal cortical infarct seen on CT scan of the brain.  MRI did not mention this in the report.  On aspirin and atorvastatin Seizure on Keppra Hypertensive urgency on presentation initially was on Cardene drip now on clonidine patch Norvasc labetalol and lisinopril Aspiration pneumonia with leukocytosis on Augmentin Urinary retention.  Patient has a history of transverse myelitis and does self catheterizations at home.  Currently has a Foley catheter in.  On Proscar. Chronic kidney disease stage IIIb  Creatinine creeped up to 1.76 today. Chronic pain and neuropathy I restarted pain medications yesterday.  Watch mental status closely.  A little lethargic today.  Get rid of a.m. Seroquel. Previous history of anoxic brain injury back in June 2022 Weakness.  PT recommending rehab but patient would like to go home. Elevated sugar with hemoglobin A1c 5.5.  Diabetes coordinator recommending short acting insulin with meals.        Code Status:     Code Status Orders  (From admission, onward)           Start     Ordered   10/17/20 1349  Full code  Continuous        10/17/20 1349           Code Status History     Date Active Date Inactive Code Status Order ID Comments User Context   10/14/2020 0212 10/17/2020 1349 DNR [id number]  Hannah Beat, MD ED   06/27/2020 1654 08/02/2020 1609 DNR [id number]  Lurene Shadow, MD Inpatient   06/04/2020 2029 06/27/2020 1654 Full Code [id number]  Andris Baumann, MD ED      Family Communication: Tried to reach brother Disposition Plan: Status is: Inpatient  Consultants: Neurology  Time spent: 27 minutes  Raelan [employer]

## 2020-10-18 NOTE — Evaluation (Signed)
Occupational Therapy Re-Evaluation Patient Details Name: Wyatt Ball Brooke Bonito. MRN: [id number] DOB: 01-09-1960 Today's Date: 10/18/2020   History of Present Illness Pt is a 60 y/o M admitted on 10/14/20 with c/c of AMS & seizures. While in the ED pt had a seizure episode with significantly elevated BP. Pt found to have subacute R parietal corical infarction with AMS/acute encephalopathy likely postictal with associated seizures. PMH: Dm2, HTN, anxiety, transverse myelitis   Clinical Impression   Mr Lichtenwalner was seen for OT re-evaluation on this date 2/2 goals met and discharge recs updated. Pt baseline lives with his brother who provides IADL support, pt Independent with cooking and self-catheterization at baseline. Upon arrival pt in bed, eager for OOB to chair. Pt follows 1 step commands with repetition. Pt requires MIN A don B socks seated EOB - assist for posterior lean. MIN A + RW for sit<>stand x5 trials - tolerates <1 min standing each trial. Pt continues to benefit from skilled OT services to maximize return to PLOF and minimize risk of future falls, injury, caregiver burden, and readmission. Will continue to follow POC. Discharge recommendation updated to CIR to reflect pt progress.      Recommendations for follow up therapy are one component of a multi-disciplinary discharge planning process, led by the attending physician.  Recommendations may be updated based on patient status, additional functional criteria and insurance authorization.   Follow Up Recommendations  CIR    Equipment Recommendations  3 in 1 bedside commode    Recommendations for Other Services Rehab consult     Precautions / Restrictions Precautions Precautions: Fall Restrictions Weight Bearing Restrictions: No      Mobility Bed Mobility Overal bed mobility: Needs Assistance Bed Mobility: Supine to Sit     Supine to sit: Min guard;HOB elevated          Transfers Overall transfer level: Needs  assistance Equipment used: Rolling walker (2 wheeled) Transfers: Sit to/from [employer] Sit to Stand: Min assist;From elevated surface Stand pivot transfers: Min guard;+2 safety/equipment       General transfer comment: Poor standing tolerance, sits quickly requiring MIN A    Balance Overall balance assessment: Needs assistance Sitting-balance support: Feet supported;No upper extremity supported Sitting balance-Leahy Scale: Fair   Postural control: Posterior lean Standing balance support: Single extremity supported;During functional activity Standing balance-Leahy Scale: Fair Standing balance comment: reaching inside BOS                           ADL either performed or assessed with clinical judgement   ADL Overall ADL's : Needs assistance/impaired                                       General ADL Comments: MIN A don B socks seated EOB - assist for posterior lean. MIN A + RW for ADL t/f - tolerates ~1 min standing bouts x5 trials      Pertinent Vitals/Pain Pain Assessment: No/denies pain           Communication     Cognition Arousal/Alertness: Awake/alert Behavior During Therapy: WFL for tasks assessed/performed Overall Cognitive Status: Impaired/Different from baseline Area of Impairment: Following commands;Safety/judgement                       Following Commands: Follows one step commands with increased time Safety/Judgement: Decreased awareness  of safety;Decreased awareness of deficits         General Comments       Exercises Exercises: Other exercises Other Exercises Other Exercises: Pt educated re: HEP, DME recs, d/c recs, falls prevention Other Exercises: LBD, functional reach, sup<>sit, sit<>stand, sitting/standing balance/tolerance   Shoulder Instructions                           OT Goals(Current goals can be found in the care plan section) Acute Rehab OT Goals Patient Stated Goal:  to return to PLOF OT Goal Formulation: With patient Time For Goal Achievement: 11/01/20 Potential to Achieve Goals: Good ADL Goals Pt Will Perform Grooming: with modified independence;standing (c LRAD PRN) Pt Will Perform Lower Body Dressing: with modified independence;sit to/from stand (c LRAD PRN) Pt Will Transfer to Toilet: with modified independence;ambulating;regular height toilet (c LRAD PRN)  OT Frequency: Min 1X/week    AM-PAC OT "6 Clicks" Daily Activity     Outcome Measure Help from another person eating meals?: A Little Help from another person taking care of personal grooming?: A Lot Help from another person toileting, which includes using toliet, bedpan, or urinal?: A Lot Help from another person bathing (including washing, rinsing, drying)?: A Lot Help from another person to put on and taking off regular upper body clothing?: A Little Help from another person to put on and taking off regular lower body clothing?: A Little 6 Click Score: 15   End of Session Equipment Utilized During Treatment: Rolling walker;Gait belt Nurse Communication: Mobility status  Activity Tolerance: Patient tolerated treatment well Patient left: in chair;with call bell/phone within reach;with chair alarm set  OT Visit Diagnosis: Other abnormalities of gait and mobility (R26.89);Cognitive communication deficit (R41.841);Other symptoms and signs involving cognitive function Symptoms and signs involving cognitive functions: Other cerebrovascular disease                Time: 2774-1287 OT Time Calculation (min): 36 min Charges:  OT General Charges $OT Visit: 1 Visit OT Evaluation $OT Re-eval: 1 Re-eval OT Treatments $Self Care/Home Management : 23-37 mins  Dessie Coma, M.S. OTR/L  10/18/20, 4:09 PM  ascom [phone]

## 2020-10-18 NOTE — Progress Notes (Signed)
Speech Language Pathology Treatment: Dysphagia  Patient Details Name: Wyatt Ball. MRN: [id number] DOB: 1960/07/31 Today's Date: 10/18/2020 Time: 9528-4132 SLP Time Calculation (min) (ACUTE ONLY): 25 min  Assessment / Plan / Recommendation Clinical Impression  Pt seen for ongoing assessment of swallowing; toleration of diet, and education w/ pt/Brother present on general aspiration precautions. He is alert, verbally responsive and engaged appropriately in conversation. Pt is on RA; wbc wnl. He is eager to eat/drink a regular diet. Pt presents w/ a much improved Cognitive/mental status -- he is alert, oriented to self, place, and general time.  Pt explained general aspiration precautions and agreed verbally to the need for following them especially sitting upright for all oral intake -- support given behind the back more for full upright sitting d/t pt being min slouched in bed. He fed himself sips of thin liquids via cup. No overt clinical s/s of aspiration were noted w/ any consistency; respiratory status remained calm and unlabored, vocal quality clear b/t trials. Oral phase appeared grossly [facility] for bolus management and timely A-P transfer for swallowing; missing Dentition. Oral clearing achieved w/ trials. Pt/NSG denied any swallowing deficits w/ previous diet.    Pt appears at reduced risk for aspriation when following general aspiration precautions. Recommend a Regular diet w/ cut meats for ease of soft foods w/ gravies added to moisten foods; Thin liquids via Cup. Recommend general aspiration precautions; Pills Whole in Puree; tray setup and positioning assistance for meals. REFLUX precautions d/t pt's baseline. ST services will sign off at this time w/ no further needs indicated. NSG updated. Precautions posted at bedside - No straws.      HPI HPI: Pt  is a 60 y.o. male with medical history significant for HTN, PTSD, diabetes, migraines, GERD, neurogenic bladder secondary to remote  history of transverse myelitis w/ ETOH use, who presented to the ER with acute onset of altered mental status.  The patient was reportedly witnessed to have a seizure episode by his roommate per EMS.  When EMS found him he was awake but confused.  His roommate could not be contacted.  While the patient was in the ER he had a seizure episode with significantly elevated blood pressure that was up to 189/114 and later 250/160.  In the ED, pt reported that he drinks alcohol on a daily basis. He was very confused and intermittently following commands w/ MD.  Admitted w/ Dx of: Subacute right parietal cortical infarction with altered mental status/acute encephalopathy likely postictal with associated seizures.  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 2. No acute infarct.  CXR at admit: Patchy opacities in the right base may reflect infection or  aspiration.  2. Cardiomegaly with vascular congestion but no definite overt  pulmonary edema.  OF NOTE: pt was recently admited 6-07/2020; he was d/c'd from skilled ST services d/t "pt was not able to participate in cognitive therapy.". He was also d/c'd on a Dysphagia level 1 (puree) diet w/ thin liquids; aspiration precautions.      SLP Plan  All goals met      Recommendations for follow up therapy are one component of a multi-disciplinary discharge planning process, led by the attending physician.  Recommendations may be updated based on patient status, additional functional criteria and insurance authorization.    Recommendations  Diet recommendations: Regular;Thin liquid Liquids provided via: Cup Medication Administration: Whole meds with puree (as needed  for easier swallowing) Supervision: Patient able to self feed Compensations: Minimize environmental distractions;Slow rate;Small sips/bites;Lingual sweep for clearance of  pocketing;Follow solids with liquid Postural Changes and/or Swallow Maneuvers: Out of bed for meals;Seated upright 90 degrees;Upright 30-60 min after meal                General recommendations:  (n/a) Oral Care Recommendations: Oral care BID;Patient independent with oral care Follow up Recommendations: None SLP Visit Diagnosis: Dysphagia, unspecified (R13.10) Plan: All goals met       GO                 Orinda Kenner, MS, CCC-SLP Speech Language Pathologist Rehab Services [phone] [facility]  10/18/2020, 5:24 PM

## 2020-10-18 NOTE — Progress Notes (Signed)
PT Cancellation Note  Patient Details Name: Wyatt Ball. MRN: [id number] DOB: 25-Dec-1960   Cancelled Treatment:    Reason Eval/Treat Not Completed: Other (comment) Attempted to see pt for PT tx with pt noted to have elevated BP at rest (systolic BP 157). Per neurology note pt has strict orders for systolic <140. Nurse reports pt has not received medication yet either. Will hold PT tx at this time & f/u as able.  Aleda Grana, PT, DPT 10/18/20, 12:29 PM    Sandi Mariscal 10/18/2020, 12:28 PM

## 2020-10-18 NOTE — Progress Notes (Signed)
Inpatient Diabetes Program Recommendations  AACE/ADA: New Consensus Statement on Inpatient Glycemic Control (2015)  Target Ranges:  Prepandial:   less than 140 mg/dL      Peak postprandial:   less than 180 mg/dL (1-2 hours)      Critically ill patients:  140 - 180 mg/dL  Results for DAMIEL, BARTHOLD (MRN [id number]) as of 10/18/2020 14:20  Ref. Range 10/17/2020 07:37 10/17/2020 11:34 10/17/2020 16:37 10/17/2020 21:22  Glucose-Capillary Latest Ref Range: 70 - 99 mg/dL 825 (H) 003 (H) 704 (H) 229 (H)  Results for ROXY, FILLER (MRN [id number]) as of 10/18/2020 14:20  Ref. Range 10/18/2020 07:29 10/18/2020 11:33  Glucose-Capillary Latest Ref Range: 70 - 99 mg/dL 038 (H) 882 (H)     Home DM Meds: Alogliptin 12.5 mg daily       Metformin 1000 mg bid  Current Orders: Novolog Sensitive Correction Scale/ SSI (0-9 units) TID AC + HS    MD- Please consider adding low dose Novolog Meal Coverage:  Novolog 3 units TID with meals  Hold if pt eats <50% of meal, Hold if pt NPO  Please also change diet to Carbohydrate Modified    --Will follow patient during hospitalization--  Ambrose Finland RN, MSN, CDE Diabetes Coordinator Inpatient Glycemic Control Team Team Pager: [phone] (8a-5p)

## 2020-10-19 DIAGNOSIS — G8929 Other chronic pain: Secondary | ICD-10-CM

## 2020-10-19 DIAGNOSIS — N189 Chronic kidney disease, unspecified: Secondary | ICD-10-CM

## 2020-10-19 DIAGNOSIS — N179 Acute kidney failure, unspecified: Secondary | ICD-10-CM

## 2020-10-19 LAB — CULTURE, BLOOD (ROUTINE X 2)
Culture: NO GROWTH
Culture: NO GROWTH
Special Requests: ADEQUATE
Special Requests: ADEQUATE

## 2020-10-19 LAB — GLUCOSE, CAPILLARY
Glucose-Capillary: 183 mg/dL — ABNORMAL HIGH (ref 70–99)
Glucose-Capillary: 207 mg/dL — ABNORMAL HIGH (ref 70–99)
Glucose-Capillary: 144 mg/dL — ABNORMAL HIGH (ref 70–99)
Glucose-Capillary: 187 mg/dL — ABNORMAL HIGH (ref 70–99)

## 2020-10-19 LAB — BASIC METABOLIC PANEL
Anion gap: 11 (ref 5–15)
BUN: 29 mg/dL — ABNORMAL HIGH (ref 8–23)
CO2: 26 mmol/L (ref 22–32)
Calcium: 9.2 mg/dL (ref 8.9–10.3)
Chloride: 99 mmol/L (ref 98–111)
Creatinine, Ser: 1.87 mg/dL — ABNORMAL HIGH (ref 0.61–1.24)
GFR, Estimated: 40 mL/min — ABNORMAL LOW (ref >60)
Glucose, Bld: 194 mg/dL — ABNORMAL HIGH (ref 70–99)
Potassium: 4.1 mmol/L (ref 3.5–5.1)
Sodium: 136 mmol/L (ref 135–145)

## 2020-10-19 MED ORDER — HYDRALAZINE HCL 20 MG/ML IJ SOLN: 10.0000 mg | Freq: Four times a day (QID) | INTRAMUSCULAR | Status: DC | PRN

## 2020-10-19 MED ORDER — SODIUM CHLORIDE 0.9 % IV BOLUS
250.0000 mL | Freq: Once | INTRAVENOUS | Status: AC
  Administered 2020-10-19: 11:00:00 250 mL via INTRAVENOUS

## 2020-10-19 MED ORDER — LISINOPRIL 20 MG PO TABS: 20.0000 mg | ORAL_TABLET | Freq: Every day | ORAL | Status: DC

## 2020-10-19 MED ORDER — SORBITOL 70 % SOLN
30.0000 mL | Freq: Every day | Status: DC | PRN
  Administered 2020-10-19: 30 mL via ORAL
  Filled 2020-10-19 (×2): qty 30

## 2020-10-19 MED ORDER — SODIUM CHLORIDE 0.9 % IV SOLN: INTRAVENOUS | Status: DC

## 2020-10-19 MED ORDER — LABETALOL HCL 5 MG/ML IV SOLN: 10.0000 mg | INTRAVENOUS | Status: DC | PRN

## 2020-10-19 NOTE — Progress Notes (Addendum)
Inpatient Rehab Admissions Coordinator:   Per OT recommendations, patient was screened for CIR candidacy by Megan Salon, MS, CCC-SLP. At this time, Pt. does not appear to demonstrate medical necessity to justify in hospital rehabilitation/CIR. I will not pursue a rehab consult for this Pt.   Recommend other rehab venues to be pursued.  Please contact me with any questions.  Megan Salon, MS, CCC-SLP Rehab Admissions Coordinator  [phone] (celll) [phone] (office)

## 2020-10-19 NOTE — Progress Notes (Signed)
Occupational Therapy Treatment Patient Details Name: Wyatt Ball. MRN: [id number] DOB: 09-11-1960 Today's Date: 10/19/2020   History of present illness Pt is a 60 y/o M admitted on 10/14/20 with c/c of AMS & seizures. While in the ED pt had a seizure episode with significantly elevated BP. Pt found to have subacute R parietal corical infarction with AMS/acute encephalopathy likely postictal with associated seizures. PMH: Dm2, HTN, anxiety, transverse myelitis   OT comments  Pt seen for OT tx this date to f/u re: safety with ADLs/ADL mobility. Pt requesting to shower and OT gets clearance from MD. Pt comes to EOB sitting with SUPV and increased time. Requires MIN A to CTS with Lofstrand as well as cues for safety (pt wanting to use his personal loftstrand crutches that he uses at baseline). OT engages pt fxl mobiltiy with MIN A with loftstrands to restroom. OT engages pt in bathing/showering tasks (at his request and with MD clearance to shower, IV conccealed with plastic) wtih SETUP for seated UB bathing and MOD A for sit/lateral lean for LB bathing on shower seat. Pt requries SETUP/MIN A for UB dressing and MAX A to don clean socks. Initially able to use Grab bars for transfer into shower with MIN A, but fatigues with seated bathing tasks, ultimately requiring arm in arm SPS with gait belt and MAX A to transfer to recliner. Pt left in recliner with chair alarm activated, all needs met and in reach, RN made aware of session contents.    Recommendations for follow up therapy are one component of a multi-disciplinary discharge planning process, led by the attending physician.  Recommendations may be updated based on patient status, additional functional criteria and insurance authorization.    Follow Up Recommendations  CIR    Equipment Recommendations  3 in 1 bedside commode    Recommendations for Other Services Rehab consult    Precautions / Restrictions Precautions Precautions:  Fall Restrictions Weight Bearing Restrictions: No       Mobility Bed Mobility Overal bed mobility: Needs Assistance Bed Mobility: Supine to Sit     Supine to sit: Supervision;HOB elevated     General bed mobility comments: increased time, but comes to sitting with no physical assist, only cueing.    Transfers Overall transfer level: Needs assistance Equipment used: Lofstrands Transfers: Sit to/from [facility] Sit to Stand: Min assist Stand pivot transfers: Max assist       General transfer comment: Pt able to perform STS from EOB with loftstrans with MIN A and minimal cueing. demos good tolerance. He becomes so tired after seated bathing, that he requires MAX A to SPS out of shower, into recliner in restroom (unsuccessful with CTS after shower x2)    Balance Overall balance assessment: Needs assistance Sitting-balance support: Feet supported;No upper extremity supported Sitting balance-Leahy Scale: Fair       Standing balance-Leahy Scale: Poor Standing balance comment: F balance initially, reverts to Poor balance after fatiguing with seated bathign tasks.                           ADL either performed or assessed with clinical judgement   ADL Overall ADL's : Needs assistance/impaired         Upper Body Bathing: Set up;Sitting   Lower Body Bathing: Minimal assistance;Moderate assistance;Sitting/lateral leans   Upper Body Dressing : Minimal assistance;Sitting   Lower Body Dressing: Maximal assistance;Sit to/from stand Lower Body Dressing Details (indicate cue type  and reason): to don socks           Tub/Shower Transfer Details (indicate cue type and reason): only MIN/MOD A to transfer into shower with shower seat and grab bars, but MAX A to SPS transfer out of shower to recliner as pt too fatigued after activity. Functional mobility during ADLs: Minimal assistance (MIN A with loftstrand crutches to restroom, but unable to complete  fxl mobiltiy back to) General ADL Comments: showering okay'ed by Dr. Earleen Newport, IV concealed with plastic     Vision   Additional Comments: appears to track appropriately this session, does wear glasses at baseline   Perception     Praxis      Cognition Arousal/Alertness: Awake/alert Behavior During Therapy: [facility] for tasks assessed/performed Overall Cognitive Status: Impaired/Different from baseline Area of Impairment: Following commands;Safety/judgement                       Following Commands: Follows one step commands with increased time Safety/Judgement: Decreased awareness of safety;Decreased awareness of deficits     General Comments: pt is appropriate conversationally, he is able to follow all commands, oriented to self, place and some aspects of situation, slightly off on temporal orientation still. He is much more appropriate but still with significant lack of insight into deificts.        Exercises Other Exercises Other Exercises: OT engages pt fxl mobiltiy with MIN A with loftstrands to restroom. OT engages pt in bathing/showering tasks (at his request and with MD clearance to shower, IV conccealed with plastic) wtih SETUP for seatd UB ADLs and MOD A for sit/lateral lean for LB bathing on shower seat. Grab bars for transfer into shower with MIN A, requires arm in arm SPS with gait belt to recliner after shower d/t fatigue.   Shoulder Instructions       General Comments      Pertinent Vitals/ Pain       Pain Assessment: Faces Faces Pain Scale: No hurt  Home Living                                          Prior Functioning/Environment              Frequency  Min 2X/week        Progress Toward Goals  OT Goals(current goals can now be found in the care plan section)  Progress towards OT goals: Progressing toward goals  Acute Rehab OT Goals Patient Stated Goal: to return to PLOF OT Goal Formulation: With patient Time For  Goal Achievement: 11/01/20 Potential to Achieve Goals: Good  Plan Frequency needs to be updated;Discharge plan remains appropriate    Co-evaluation                 AM-PAC OT "6 Clicks" Daily Activity     Outcome Measure   Help from another person eating meals?: A Little Help from another person taking care of personal grooming?: A Lot Help from another person toileting, which includes using toliet, bedpan, or urinal?: A Lot Help from another person bathing (including washing, rinsing, drying)?: A Lot Help from another person to put on and taking off regular upper body clothing?: A Little Help from another person to put on and taking off regular lower body clothing?: A Lot 6 Click Score: 14    End of Session Equipment Utilized During Treatment:  Rolling walker;Gait belt;Other (comment) (loftstrands that pt prefers and uses at baseline, used for fxl mobiltiy to restroom, subsequent transfer trials attempted with RW or arm in arm for increased stability as pt not that successful with loftstrands after fatiguing.)  OT Visit Diagnosis: Other abnormalities of gait and mobility (R26.89);Cognitive communication deficit (R41.841);Other symptoms and signs involving cognitive function Symptoms and signs involving cognitive functions: Other cerebrovascular disease   Activity Tolerance Patient tolerated treatment well   Patient Left in chair;with call bell/phone within reach;with chair alarm set   Nurse Communication Mobility status        Time: 7583-0746 OT Time Calculation (min): 53 min  Charges: OT General Charges $OT Visit: 1 Visit OT Treatments $Self Care/Home Management : 23-37 mins $Therapeutic Activity: 23-37 mins  Gerrianne Scale, MS, OTR/L ascom [phone] 10/19/20, 2:40 PM

## 2020-10-19 NOTE — TOC Initial Note (Addendum)
Transition of Care (TOC) - Initial/Assessment Note    Patient Details  Name: Wyatt Ball. MRN: [id number] Date of Birth: 04-22-1960  Transition of Care [facility]) CM/SW Contact:    Liliana Cline, LCSW Phone Number: 10/19/2020, 4:12 PM  Clinical Narrative:              CIR has declined patient. SNF rec.  Patient asleep. Call to patient's brother Wyatt Ball. Wyatt Ball reported patient lives with him. Patient recently went to SNF in [place] and did not have a good experience. Both patient and Wyatt Ball are refusing SNF recommendation. Patient has crutches and a RW at home. Wyatt Ball states patient needs a rollator, he would like to see if the [place] would cover this. If not, he plans to purchase one for the patient. Wyatt Ball reported patient was recently active with [facility] services but was not sure of the agency. CSW sent secure email to Wonda Cerise with [facility] to inquire if patient is eligible for a rollator through them and which [facility] agency patient was set up with. Waiting for response.   Expected Discharge Plan: Home w Home Health Services Barriers to Discharge: Continued Medical Work up   Patient Goals and CMS Choice Patient states their goals for this hospitalization and ongoing recovery are:: home with home health CMS Medicare.gov Compare Post Acute Care list provided to:: Patient Represenative (must comment) Choice offered to / list presented to : Sibling  Expected Discharge Plan and Services Expected Discharge Plan: Home w Home Health Services       Living arrangements for the past 2 months: Single Family Home                                      Prior Living Arrangements/Services Living arrangements for the past 2 months: Single Family Home Lives with:: Siblings Patient language and need for interpreter reviewed:: Yes Do you feel safe going back to the place where you live?: Yes      Need for Family Participation in Patient Care: Yes (Comment) Care giver support system in place?: Yes  (comment) Current home services: DME Criminal Activity/Legal Involvement Pertinent to Current Situation/Hospitalization: No - Comment as needed  Activities of Daily Living Home Assistive Devices/Equipment: None ADL Screening (condition at time of admission) Patient's cognitive ability adequate to safely complete daily activities?: No Is the patient deaf or have difficulty hearing?: No Does the patient have difficulty seeing, even when wearing glasses/contacts?: No Does the patient have difficulty concentrating, remembering, or making decisions?: Yes Patient able to express need for assistance with ADLs?: Yes Does the patient have difficulty dressing or bathing?: No Independently performs ADLs?: No Communication: Independent Dressing (OT): Needs assistance Is this a change from baseline?: Pre-admission baseline Grooming: Needs assistance Is this a change from baseline?: Pre-admission baseline Feeding: Independent Bathing: Needs assistance Is this a change from baseline?: Pre-admission baseline Toileting: Needs assistance Is this a change from baseline?: Pre-admission baseline In/Out Bed: Needs assistance Is this a change from baseline?: Pre-admission baseline Walks in Home: Needs assistance Is this a change from baseline?: Pre-admission baseline Does the patient have difficulty walking or climbing stairs?: Yes Weakness of Legs: Both Weakness of Arms/Hands: None  Permission Sought/Granted Permission sought to share information with : Facility Industrial/product designer granted to share information with : Yes, Verbal Permission Granted     Permission granted to share info w AGENCY: [facility], [facility]  Emotional Assessment       Orientation: : Oriented to Self, Oriented to Place, Oriented to  Time, Oriented to Situation Alcohol / Substance Use: Not Applicable Psych Involvement: No (comment)  Admission diagnosis:  Stroke (cerebrum) (HCC) [I63.9] Hypertensive urgency  [I16.0] Aspiration into lower respiratory tract [T17.800A] Cerebrovascular accident (CVA), unspecified mechanism (HCC) [I63.9] Patient Active Problem List   Diagnosis Date Noted   Lethargy    Stage 3b chronic kidney disease (HCC)    Neuropathy    Posterior reversible encephalopathy syndrome    Seizure (HCC)    Aspiration into lower respiratory tract    Drop in hemoglobin    Stage 3a chronic kidney disease (HCC)    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   Hypertensive urgency 10/14/2020   Malnutrition of moderate degree 07/10/2020   AKI (acute kidney injury) (HCC) 06/26/2020   Pressure injury of skin 06/25/2020   Altered mental status    Acute urinary retention    Acute metabolic encephalopathy 06/04/2020   Severe sepsis with septic shock (HCC) 06/04/2020   CAP (community acquired pneumonia) 06/04/2020   Neurogenic bladder 06/04/2020   Chronic, continuous use of opioids 06/04/2020   Chronic low back pain 06/04/2020   Type 2 diabetes mellitus without complication (HCC) 06/04/2020   Migraines 06/04/2020   Recurrent UTI 06/04/2020   History of colon polyps 06/04/2020   PCP:  System, Provider Not In Pharmacy:   [id number] - [place], [place] - [address] [place] [address] Phone: [phone] Fax: [phone]     Social Determinants of Health (SDOH) Interventions    Readmission Risk Interventions Readmission Risk Prevention Plan 10/19/2020 08/01/2020  Transportation Screening Complete Complete  Medication Review Oceanographer) Complete Complete  PCP or Specialist appointment within 3-5 days of discharge Complete Complete  HRI or Home Care Consult Complete Complete  SW Recovery Care/Counseling Consult Complete Not Complete  SW Consult Not Complete Comments - Patient assigned to [facility]  Palliative Care Screening Not Applicable Complete  Skilled Nursing Facility Complete Complete  Some recent data might be hidden

## 2020-10-19 NOTE — Progress Notes (Signed)
Patient ID: Wyatt Garbe Holton Montez Ball., male   DOB: [birth year], 60 y.o.   MRN: [id number] [facility] PROGRESS NOTE  Wyatt Ball. GEX:[id number] DOB: 07-Sep-1960 DOA: 10/14/2020 PCP: System, Provider Not In  HPI/Subjective: Patient asked me if he can go home today.  Occupational Therapy was going to get him in a shower today.  We are going to remove his Foley catheter so he can do his self catheterizations here in the hospital before he gets home to do them.  Blood pressure better controlled.  Patient feeling better.  Admitted with pres.  Objective: Vitals:   10/19/20 1158 10/19/20 1555  BP: 124/75 137/72  Pulse: 80 74  Resp: 16 16  Temp: 97.8 F (36.6 C) 98.1 F (36.7 C)  SpO2: 99% 99%    Intake/Output Summary (Last 24 hours) at 10/19/2020 1648 Last data filed at 10/19/2020 1536 Gross per 24 hour  Intake 683.26 ml  Output 5000 ml  Net -4316.74 ml   Filed Weights   10/14/20 0030  Weight: 63 kg    ROS: Review of Systems  Respiratory:  Negative for shortness of breath.   Cardiovascular:  Negative for chest pain.  Gastrointestinal:  Negative for abdominal pain, nausea and vomiting.  Exam: Physical Exam HENT:     Head: Normocephalic.     Mouth/Throat:     Pharynx: No oropharyngeal exudate.  Eyes:     General: Lids are normal.     Conjunctiva/sclera: Conjunctivae normal.  Cardiovascular:     Rate and Rhythm: Normal rate and regular rhythm.     Heart sounds: Normal heart sounds, S1 normal and S2 normal.  Pulmonary:     Breath sounds: No decreased breath sounds, wheezing, rhonchi or rales.  Abdominal:     Palpations: Abdomen is soft.     Tenderness: There is no abdominal tenderness.  Musculoskeletal:     Right lower leg: No swelling.     Left lower leg: No swelling.  Skin:    General: Skin is warm.     Findings: No rash.  Neurological:     Mental Status: He is alert and oriented to person, place, and time.     Comments: Able to straight leg raise.       Scheduled Meds:   stroke: mapping our early stages of recovery book   Does not apply Once   amLODipine  10 mg Oral Daily   amoxicillin-clavulanate  1 tablet Oral Q12H   aspirin  81 mg Oral Daily   atorvastatin  40 mg Oral Daily   Chlorhexidine Gluconate Cloth  6 each Topical Q0600   cloNIDine  0.3 mg Transdermal Weekly   docusate sodium  200 mg Oral BID   enoxaparin (LOVENOX) injection  40 mg Subcutaneous Q24H   feeding supplement (NEPRO CARB STEADY)  237 mL Oral TID BM   finasteride  5 mg Oral Daily   gabapentin  100 mg Oral BID WC   gabapentin  300 mg Oral QHS   influenza vac split quadrivalent PF  0.5 mL Intramuscular Tomorrow-1000   insulin aspart  0-9 Units Subcutaneous TID AC & HS   insulin aspart  2 Units Subcutaneous TID WC   labetalol  200 mg Oral TID   latanoprost  1 drop Both Eyes QHS   levETIRAcetam  500 mg Oral BID   loratadine  10 mg Oral Daily   morphine  15 mg Oral Q12H   pantoprazole  40 mg Oral Daily   prazosin  4 mg Oral  QHS   QUEtiapine  25 mg Oral QHS   traZODone  150 mg Oral QHS   Continuous Infusions:  sodium chloride 50 mL/hr at 10/19/20 1536    Assessment/Plan:  Pres on MRI of the brain.  Blood pressure better controlled today with increasing the labetalol 200 mg 3 times a day.  Patient on clonidine patch Norvasc and Minipress.  Holding lisinopril with increasing creatinine. Possibility of right parietal cortical infarct seen on CT scan of the brain.  MRI did not mention this in the report.  On aspirin and atorvastatin. Acute kidney injury on chronic kidney disease stage IIIa.  Creatinine 1.5 to the other day and up to 1.87.  Discontinue lisinopril give IV fluid bolus and hydration today and recheck creatinine tomorrow. Seizure with press on Keppra. Hypertensive urgency on presentation initially on Cardene drip.  Now on clonidine patch, Norvasc, labetalol and Minipress. Urinary retention with history of transverse myelitis.  DC Foley catheter  today.  Patient will do straight cath [facility] to make sure he is able to do this prior to disposition home. Chronic pain and neuropathy.  I restarted his pain medications.  Discontinued Seroquel in the morning. Previous history of anoxic brain injury back in June 2022. Weakness.  Physical therapy recommending rehab but patient wants to go home. Elevated sugar.  Hemoglobin A1c 5.5.  Did give short acting insulin prior to meals.       Code Status:     Code Status Orders  (From admission, onward)           Start     Ordered   10/17/20 1349  Full code  Continuous        10/17/20 1349           Code Status History     Date Active Date Inactive Code Status Order ID Comments User Context   10/14/2020 0212 10/17/2020 1349 DNR [id number]  Hannah Beat, MD ED   06/27/2020 1654 08/02/2020 1609 DNR [id number]  Lurene Shadow, MD Inpatient   06/04/2020 2029 06/27/2020 1654 Full Code [id number]  Andris Baumann, MD ED      Family Communication: Spoke with patient's brother on the phone Disposition Plan: Status is: Inpatient  Time spent: 27 minutes  Makaylah [employer]

## 2020-10-20 DIAGNOSIS — E1169 Type 2 diabetes mellitus with other specified complication: Secondary | ICD-10-CM

## 2020-10-20 DIAGNOSIS — E785 Hyperlipidemia, unspecified: Secondary | ICD-10-CM

## 2020-10-20 LAB — BASIC METABOLIC PANEL
Anion gap: 5 (ref 5–15)
BUN: 33 mg/dL — ABNORMAL HIGH (ref 8–23)
CO2: 28 mmol/L (ref 22–32)
Calcium: 9 mg/dL (ref 8.9–10.3)
Chloride: 103 mmol/L (ref 98–111)
Creatinine, Ser: 1.75 mg/dL — ABNORMAL HIGH (ref 0.61–1.24)
GFR, Estimated: 44 mL/min — ABNORMAL LOW (ref >60)
Glucose, Bld: 186 mg/dL — ABNORMAL HIGH (ref 70–99)
Potassium: 4 mmol/L (ref 3.5–5.1)
Sodium: 136 mmol/L (ref 135–145)

## 2020-10-20 LAB — GLUCOSE, CAPILLARY
Glucose-Capillary: 167 mg/dL — ABNORMAL HIGH (ref 70–99)
Glucose-Capillary: 182 mg/dL — ABNORMAL HIGH (ref 70–99)

## 2020-10-20 MED ORDER — ATORVASTATIN CALCIUM 40 MG PO TABS: 40.0000 mg | ORAL_TABLET | Freq: Every day | ORAL | 0 refills | Status: AC

## 2020-10-20 MED ORDER — DULOXETINE HCL 60 MG PO CPEP: 60.0000 mg | ORAL_CAPSULE | Freq: Every day | ORAL | 3 refills | Status: AC

## 2020-10-20 MED ORDER — AMLODIPINE BESYLATE 10 MG PO TABS: 10.0000 mg | ORAL_TABLET | Freq: Every day | ORAL | 0 refills | Status: AC

## 2020-10-20 MED ORDER — CLONIDINE 0.3 MG/24HR TD PTWK: 0.3000 mg | MEDICATED_PATCH | TRANSDERMAL | 0 refills | Status: AC

## 2020-10-20 MED ORDER — GABAPENTIN 100 MG PO CAPS: 100.0000 mg | ORAL_CAPSULE | Freq: Two times a day (BID) | ORAL | 0 refills | Status: DC

## 2020-10-20 MED ORDER — NEPRO/CARBSTEADY PO LIQD: 237.0000 mL | Freq: Three times a day (TID) | ORAL | 0 refills | Status: AC

## 2020-10-20 MED ORDER — LEVETIRACETAM 500 MG PO TABS: 500.0000 mg | ORAL_TABLET | Freq: Two times a day (BID) | ORAL | 0 refills | Status: AC

## 2020-10-20 MED ORDER — QUETIAPINE FUMARATE 25 MG PO TABS: 25.0000 mg | ORAL_TABLET | Freq: Every day | ORAL | Status: AC

## 2020-10-20 MED ORDER — LABETALOL HCL 200 MG PO TABS: 200.0000 mg | ORAL_TABLET | Freq: Three times a day (TID) | ORAL | 0 refills | Status: AC

## 2020-10-20 NOTE — Discharge Summary (Signed)
[facility]   PATIENT NAME: Wyatt Ball    MR#:  [id number]  DATE OF BIRTH:  1960-04-29  DATE OF ADMISSION:  10/14/2020 ADMITTING PHYSICIAN: Hannah Beat, MD  DATE OF DISCHARGE: 10/20/2020  PRIMARY CARE PHYSICIAN: System, Provider Not In    ADMISSION DIAGNOSIS:  Stroke (cerebrum) (HCC) [I63.9] Hypertensive urgency [I16.0] Aspiration into lower respiratory tract [T17.800A] Cerebrovascular accident (CVA), unspecified mechanism (HCC) [I63.9]  DISCHARGE DIAGNOSIS:  Active Problems:   Other chronic pain   Acute urinary retention   Acute kidney injury superimposed on CKD (HCC)   Stroke (cerebrum) (HCC)   Hypertensive urgency   Posterior reversible encephalopathy syndrome   Seizure (HCC)   Aspiration into lower respiratory tract   Drop in hemoglobin   Stage 3a chronic kidney disease (HCC)   Neuropathy   Lethargy   Stage 3b chronic kidney disease (HCC)   SECONDARY DIAGNOSIS:   Past Medical History:  Diagnosis Date  . Anxiety   . DM (diabetes mellitus) (HCC)   . HTN (hypertension)   . Transverse myelitis (HCC)     HOSPITAL COURSE:   1.  PRES seen on MRI of the brain.  Neurology recommended good blood pressure control.  Labetalol titrated up to 200 mg 3 times a day the other day.  Patient on clonidine patch, Norvasc and Minipress.  Holding lisinopril at this time with increased creatinine. 2.  Possibility of right parietal cortical infarction seen on CT scan but not MRI of the brain.  Continue aspirin and atorvastatin. 3.  Acute kidney injury on chronic kidney disease stage IIIa.  Creatinine 1.5 the other day and went up to 1.87.  Today's creatinine 1.75.  Continue to hold lisinopril.  Stay hydrated as outpatient. 4.  Seizure with PRES. continue Keppra. 5.  Hypertensive urgency on presentation initially was on Cardene drip.  Now on clonidine patch, Norvasc, labetalol and Minipress. 6.  Urinary retention with history of  transverse myelitis.  The patient had a Foley catheter during the hospital course.  Normally does straight catheterizations at home.  We discontinue Foley catheter and he is able to do straight catheterization. 7.  Chronic pain and neuropathy.  Once he was able to [facility] better I did start his chronic pain medications back again.  The patient has prescriptions for this already. 8.  Weakness.  Physical therapy initially recommended rehab.  Patient wants to go home.  Physical therapy change the recommendations to home with assistance and home health. 9.  Previous anoxic brain injury back in June 2022 10.  Type 2 diabetes mellitus with hyperlipidemia.  Hemoglobin A1c 5.5.  Holding metformin with acute kidney injury.  Switch the patient Zocor over to atorvastatin because Zocor interacts with Norvasc. 11.  Acute metabolic encephalopathy with Pres and seizure.  Improved back to baseline. 12.  Anemia, likely dilutional with vigorous IV fluids during the hospital course.  Last hemoglobin 9.7.  DISCHARGE CONDITIONS:   Satisfactory  CONSULTS OBTAINED:    Neurology  DRUG ALLERGIES:   Allergies  Allergen Reactions  . Lactose Intolerance (Gi) Nausea And Vomiting  . Methadone Nausea And Vomiting  . Molds & Smuts Nausea And Vomiting  . Sulfa Antibiotics Rash and Hives    Other reaction(s): Urticaria, ANGIOEDEMA OF LIPS    DISCHARGE MEDICATIONS:   Allergies as of 10/20/2020       Reactions   Lactose Intolerance (gi) Nausea And Vomiting   Methadone Nausea And Vomiting   Molds & Smuts Nausea  And Vomiting   Sulfa Antibiotics Rash, Hives   Other reaction(s): Urticaria, ANGIOEDEMA OF LIPS        Medication List     STOP taking these medications    Alogliptin Benzoate 12.5 MG Tabs   guaiFENesin 600 MG 12 hr tablet Commonly known as: MUCINEX   ibuprofen 800 MG tablet Commonly known as: ADVIL   insulin glargine 100 UNIT/ML injection Commonly known as: LANTUS   lisinopril 40 MG  tablet Commonly known as: ZESTRIL   metFORMIN 1000 MG tablet Commonly known as: GLUCOPHAGE   metoprolol tartrate 100 MG tablet Commonly known as: LOPRESSOR   pantoprazole 40 MG tablet Commonly known as: PROTONIX   sildenafil 100 MG tablet Commonly known as: VIAGRA   simvastatin 80 MG tablet Commonly known as: ZOCOR Replaced by: atorvastatin 40 MG tablet   sorbitol 70 % solution       TAKE these medications    amLODipine 10 MG tablet Commonly known as: NORVASC Take 1 tablet (10 mg total) by mouth daily.   aspirin 81 MG chewable tablet Chew 81 mg by mouth daily.   atorvastatin 40 MG tablet Commonly known as: LIPITOR Take 1 tablet (40 mg total) by mouth daily. Start taking on: October 21, 2020 Replaces: simvastatin 80 MG tablet   cloNIDine 0.3 mg/24hr patch Commonly known as: CATAPRES - Dosed in mg/24 hr Place 1 patch (0.3 mg total) onto the skin once a week.   cyclobenzaprine 10 MG tablet Commonly known as: FLEXERIL Take 10 mg by mouth 3 (three) times daily as needed for muscle spasms.   DSS 100 MG Caps Take 200 mg by mouth 2 (two) times daily.   DULoxetine 60 MG capsule Commonly known as: CYMBALTA Take 1 capsule (60 mg total) by mouth daily. What changed: how much to take   feeding supplement (NEPRO CARB STEADY) Liqd Take 237 mLs by mouth 3 (three) times daily between meals.   gabapentin 100 MG capsule Commonly known as: NEURONTIN Take 1 capsule (100 mg total) by mouth 2 (two) times daily with breakfast and lunch. What changed: Another medication with the same name was removed. Continue taking this medication, and follow the directions you see here.   hydrOXYzine 50 MG tablet Commonly known as: ATARAX/VISTARIL Take 50 mg by mouth every 6 (six) hours as needed for anxiety.   labetalol 200 MG tablet Commonly known as: NORMODYNE Take 1 tablet (200 mg total) by mouth 3 (three) times daily.   latanoprost 0.005 % ophthalmic solution Commonly known as:  XALATAN Place 2 drops into both eyes at bedtime.   levETIRAcetam 500 MG tablet Commonly known as: KEPPRA Take 1 tablet (500 mg total) by mouth 2 (two) times daily.   loratadine 10 MG tablet Commonly known as: CLARITIN Take 10 mg by mouth daily.   morphine 15 MG 12 hr tablet Commonly known as: MS CONTIN Take 15 mg by mouth every 12 (twelve) hours.   omeprazole 20 MG capsule Commonly known as: PRILOSEC Take 20 mg by mouth 2 (two) times daily before a meal.   oxyCODONE-acetaminophen 5-325 MG tablet Commonly known as: PERCOCET/ROXICET Take 1 tablet by mouth 2 (two) times daily as needed for severe pain.   prazosin 2 MG capsule Commonly known as: MINIPRESS Take 4 mg by mouth at bedtime. May take an additional 1 mg tablet with 4 mg at bedtime as needed   prazosin 1 MG capsule Commonly known as: MINIPRESS Take 1 mg by mouth 2 (two) times daily as needed (  PTSD symptoms). May take an additional 1 mg tablet along with 4 mg at bedtime as needed   promethazine 25 MG tablet Commonly known as: PHENERGAN Take 25 mg by mouth 3 (three) times daily as needed for nausea.   QUEtiapine 25 MG tablet Commonly known as: SEROQUEL Take 1 tablet (25 mg total) by mouth at bedtime. What changed: when to take this   traZODone 150 MG tablet Commonly known as: DESYREL Take 150 mg by mouth at bedtime.               Durable Medical Equipment  (From admission, onward)           Start     Ordered   10/20/20 1143  For home use only DME Tub bench  Once        10/20/20 1142             DISCHARGE INSTRUCTIONS:   Follow-up your medical doctor at the [place] 5 days  If you experience worsening of your admission symptoms, develop shortness of breath, life threatening emergency, suicidal or homicidal thoughts you must seek medical attention immediately by calling 911 or calling your MD immediately  if symptoms less severe.  You Must read complete instructions/literature along with all the  possible adverse reactions/side effects for all the Medicines you take and that have been prescribed to you. Take any new Medicines after you have completely understood and accept all the possible adverse reactions/side effects.   Please note  You were cared for by a hospitalist during your hospital stay. If you have any questions about your discharge medications or the care you received while you were in the hospital after you are discharged, you can call the unit and asked to speak with the hospitalist on call if the hospitalist that took care of you is not available. Once you are discharged, your primary care physician will handle any further medical issues. Please note that NO REFILLS for any discharge medications will be authorized once you are discharged, as it is imperative that you return to your primary care physician (or establish a relationship with a primary care physician if you do not have one) for your aftercare needs so that they can reassess your need for medications and monitor your lab values.    Today   CHIEF COMPLAINT:   Chief Complaint  Patient presents with  . Seizures    HISTORY OF PRESENT ILLNESS:  Wyatt Ball  is a 59 y.o. male initially admitted with seizure   VITAL SIGNS:  Blood pressure 129/73, pulse 79, temperature 97.7 F (36.5 C), temperature source Oral, resp. rate 18, height 5\' 5"  (1.651 m), weight 63 kg, SpO2 99 %.  I/O:   Intake/Output Summary (Last 24 hours) at 10/20/2020 1611 Last data filed at 10/20/2020 1428 Gross per 24 hour  Intake 1193.04 ml  Output 2000 ml  Net -806.96 ml    PHYSICAL EXAMINATION:  GENERAL:  60 y.o.-year-old patient lying in the bed with no acute distress.  EYES: Pupils equal, round, reactive to light and accommodation. No scleral icterus.  HEENT: Head atraumatic, normocephalic. Oropharynx and nasopharynx clear.  LUNGS: Normal breath sounds bilaterally, no wheezing, rales,rhonchi or crepitation. No use of  accessory muscles of respiration.  CARDIOVASCULAR: S1, S2 normal. No murmurs, rubs, or gallops.  ABDOMEN: Soft, non-tender, non-distended. Bowel sounds present. No organomegaly or mass.  EXTREMITIES: No pedal edema, cyanosis, or clubbing.  NEUROLOGIC: Cranial nerves II through XII are intact.  Patient able to  straight leg raise bilaterally. Sensation intact. Gait not checked.  PSYCHIATRIC: The patient is alert and oriented x 3.  SKIN: No obvious rash, lesion, or ulcer.   DATA REVIEW:   CBC Recent Labs  Lab 10/18/20 0521  WBC 7.4  HGB 9.7*  HCT 29.5*  PLT 221    Chemistries  Recent Labs  Lab 10/14/20 0029 10/15/20 0512 10/16/20 0405 10/18/20 0521 10/20/20 0439  NA 136   < > 144   < > 136  K 4.4   < > 3.3*   < > 4.0  CL 101   < > 113*   < > 103  CO2 25   < > 23   < > 28  GLUCOSE 165*   < > 135*   < > 186*  BUN 21   < > 21   < > 33*  CREATININE 1.54*   < > 1.52*   < > 1.75*  CALCIUM 9.6   < > 9.0   < > 9.0  MG  --   --  1.7  --   --   AST 14*  --   --   --   --   ALT 10  --   --   --   --   ALKPHOS 82  --   --   --   --   BILITOT 0.6  --   --   --   --    < > = values in this interval not displayed.     Microbiology Results  Results for orders placed or performed during the hospital encounter of 10/14/20  Resp Panel by RT-PCR (Flu A&B, Covid) Nasopharyngeal Swab     Status: None   Collection Time: 10/14/20  1:48 AM   Specimen: Nasopharyngeal Swab; Nasopharyngeal(NP) swabs in vial transport medium  Result Value Ref Range Status   SARS Coronavirus 2 by RT PCR NEGATIVE NEGATIVE Final    Comment: (NOTE) SARS-CoV-2 target nucleic acids are NOT DETECTED.  The SARS-CoV-2 RNA is generally detectable in upper respiratory specimens during the acute phase of infection. The lowest concentration of SARS-CoV-2 viral copies this assay can detect is 138 copies/mL. A negative result does not preclude SARS-Cov-2 infection and should not be used as the sole basis for treatment  or other patient management decisions. A negative result may occur with  improper specimen collection/handling, submission of specimen other than nasopharyngeal swab, presence of viral mutation(s) within the areas targeted by this assay, and inadequate number of viral copies(<138 copies/mL). A negative result must be combined with clinical observations, patient history, and epidemiological information. The expected result is Negative.  Fact Sheet for Patients:  BloggerCourse.com  Fact Sheet for Healthcare Providers:  SeriousBroker.it  This test is no t yet approved or cleared by the [place] FDA and  has been authorized for detection and/or diagnosis of SARS-CoV-2 by FDA under an Emergency Use Authorization (EUA). This EUA will remain  in effect (meaning this test can be used) for the duration of the COVID-19 declaration under Section 564(b)(1) of the Act, 21 U.S.C.section 360bbb-3(b)(1), unless the authorization is terminated  or revoked sooner.       Influenza A by PCR NEGATIVE NEGATIVE Final   Influenza B by PCR NEGATIVE NEGATIVE Final    Comment: (NOTE) The Xpert Xpress SARS-CoV-2/FLU/RSV plus assay is intended as an aid in the diagnosis of influenza from Nasopharyngeal swab specimens and should not be used as a sole basis for treatment. Nasal  washings and aspirates are unacceptable for Xpert Xpress SARS-CoV-2/FLU/RSV testing.  Fact Sheet for Patients: BloggerCourse.com  Fact Sheet for Healthcare Providers: SeriousBroker.it  This test is not yet approved or cleared by the [place] FDA and has been authorized for detection and/or diagnosis of SARS-CoV-2 by FDA under an Emergency Use Authorization (EUA). This EUA will remain in effect (meaning this test can be used) for the duration of the COVID-19 declaration under Section 564(b)(1) of the Act, 21 U.S.C. section  360bbb-3(b)(1), unless the authorization is terminated or revoked.  Performed at [facility], [address] [place], [place] [address]   CULTURE, BLOOD (ROUTINE X 2) w Reflex to ID Panel     Status: None   Collection Time: 10/14/20  3:44 PM   Specimen: BLOOD  Result Value Ref Range Status   Specimen Description BLOOD BLOOD RIGHT ARM  Final   Special Requests   Final    BOTTLES DRAWN AEROBIC AND ANAEROBIC Blood Culture adequate volume   Culture   Final    NO GROWTH 5 DAYS Performed at [facility], [address] [place], [place] [address]    Report Status 10/19/2020 FINAL  Final  CULTURE, BLOOD (ROUTINE X 2) w Reflex to ID Panel     Status: None   Collection Time: 10/14/20  4:06 PM   Specimen: BLOOD  Result Value Ref Range Status   Specimen Description BLOOD RIGHT ANTECUBITAL  Final   Special Requests   Final    BOTTLES DRAWN AEROBIC AND ANAEROBIC Blood Culture adequate volume   Culture   Final    NO GROWTH 5 DAYS Performed at [facility], [address], [place], [place] [address]    Report Status 10/19/2020 FINAL  Final  MRSA Next Gen by PCR, Nasal     Status: None   Collection Time: 10/14/20  6:12 PM   Specimen: Nasal Mucosa; Nasal Swab  Result Value Ref Range Status   MRSA by PCR Next Gen NOT DETECTED NOT DETECTED Final    Comment: (NOTE) The GeneXpert MRSA Assay (FDA approved for NASAL specimens only), is one component of a comprehensive MRSA colonization surveillance program. It is not intended to diagnose MRSA infection nor to guide or monitor treatment for MRSA infections. Test performance is not FDA approved in patients less than 21 years old. Performed at [facility], [address] [place], [place] [address]       Management plans discussed with the patient, family and they are in agreement.  CODE STATUS:     Code Status Orders  (From admission, onward)           Start     Ordered   10/17/20 1349   Full code  Continuous        10/17/20 1349           Code Status History     Date Active Date Inactive Code Status Order ID Comments User Context   10/14/2020 0212 10/17/2020 1349 DNR [id number]  Hannah Beat, MD ED   06/27/2020 1654 08/02/2020 1609 DNR [id number]  Lurene Shadow, MD Inpatient   06/04/2020 2029 06/27/2020 1654 Full Code [id number]  Andris Baumann, MD ED       TOTAL TIME TAKING CARE OF THIS PATIENT: 35 minutes.    Alford Highland M.D on 10/20/2020 at 4:11 PM    [facility]  CC: Primary care physician; System, Provider Not In

## 2020-10-20 NOTE — TOC Transition Note (Signed)
Transition of Care [facility]) - CM/SW Discharge Note   Patient Details  Name: Wyatt Ball. MRN: [id number] Date of Birth: [birth year]  Transition of Care [facility]) CM/SW Contact:  Liliana Cline, LCSW Phone Number: 10/20/2020, 12:09 PM   Clinical Narrative:     Patient is discharging home today.  Patient unsure of which [facility] agency he is active with.  Per [facility], it may be [facility]-- attempted to reach out to [employer] with no response. Sent secure email to Cala Bradford and included [employer] for follow up tomorrow.  Also waiting on response from Dana with [facility]. Asked MD for [facility] orders.     Final next level of care: Home w Home Health Services Barriers to Discharge: Barriers Resolved   Patient Goals and CMS Choice Patient states their goals for this hospitalization and ongoing recovery are:: home with home health CMS Medicare.gov Compare Post Acute Care list provided to:: Patient Represenative (must comment) Choice offered to / list presented to : Sibling  Discharge Placement                    Patient and family notified of of transfer: 10/20/20  Discharge Plan and Services                                     Social Determinants of Health (SDOH) Interventions     Readmission Risk Interventions Readmission Risk Prevention Plan 10/19/2020 08/01/2020  Transportation Screening Complete Complete  Medication Review [employer]) Complete Complete  PCP or Specialist appointment within 3-5 days of discharge Complete Complete  HRI or Home Care Consult Complete Complete  SW Recovery Care/Counseling Consult Complete Not Complete  SW Consult Not Complete Comments - Patient assigned to [facility]  Palliative Care Screening Not Applicable Complete  Skilled Nursing Facility Complete Complete  Some recent data might be hidden

## 2020-10-20 NOTE — Discharge Instructions (Signed)
Simvastatin switched to atovastatin

## 2020-10-20 NOTE — Progress Notes (Signed)
Physical Therapy Treatment Patient Details Name: Wyatt Ball. MRN: [id number] DOB: 02-Aug-1960 Today's Date: 10/20/2020   History of Present Illness Pt is a 60 y/o M admitted on 10/14/20 with c/c of AMS & seizures. While in the ED pt had a seizure episode with significantly elevated BP. Pt found to have subacute R parietal corical infarction with AMS/acute encephalopathy likely postictal with associated seizures. PMH: Dm2, HTN, anxiety, transverse myelitis    PT Comments    Pt received supine in bed, agreeable to therapy after he completed the level he was on in his game. PT prepared room/IV in the meantime. Pt performed bed mobility with SUP, STS and ambulation with CGA  - initially CGA for safety evolving into CGA to steady as pt fatigued at end of ambulation. Pt did use Lofstrand crutches during mobility due to pt using these at baseline. He demo inconsistency sequencing stepping pattern with AD advancement - PT presumes this is baseline as pt appeared comfortable with ambulation. He did report fatigue with final 20-25ft and required increased steadying support at this time - PT advised pt to be aware of ambulation distance at this time due to muscular fatigue in order to reduce fall risk. Pt did follow commands well throughout session however he continues to present with safety awareness deficits. PT has been in communication with MD this morning as pt is requesting to d/c home rather than rehab. PT is changing d/c recs to home with HHPT and supervision with all out of bed mobility. Pt states he needs tub transfer bench for home, otherwise has all DME. Would benefit from skilled PT to address above deficits and promote optimal return to PLOF.   Recommendations for follow up therapy are one component of a multi-disciplinary discharge planning process, led by the attending physician.  Recommendations may be updated based on patient status, additional functional criteria and insurance  authorization.  Follow Up Recommendations  Home health PT;Supervision for mobility/OOB     Equipment Recommendations  Other (comment) (tub transfer bench)    Recommendations for Other Services       Precautions / Restrictions Precautions Precautions: Fall Restrictions Weight Bearing Restrictions: No     Mobility  Bed Mobility Overal bed mobility: Needs Assistance Bed Mobility: Supine to Sit;Sit to Supine     Supine to sit: Supervision;HOB elevated Sit to supine: Supervision   General bed mobility comments: Increased time and effort    Transfers Overall transfer level: Needs assistance Equipment used: Lofstrands Transfers: Sit to/from Stand Sit to Stand: Min guard         General transfer comment: STS from EOB using lofstrands and CGA to steady  Ambulation/Gait Ambulation/Gait assistance: Min guard Gait Distance (Feet): 90 Feet Assistive device: Lofstrands Gait Pattern/deviations: Step-through pattern;Decreased stride length;Decreased weight shift to right Gait velocity: significantly decreased   General Gait Details: Pt ambulates 70ft using lofstrand crutches, CGA at all times for safety, steadying required in final 11ft due to fatigue. Pt places crutches significantly anterior and has inconsistent sequencing of stepping with advancing crutches. Difficulty multi-tasking - holding conversation and walking. Speed improved when PT limited distraction.   Stairs             Wheelchair Mobility    Modified Rankin (Stroke Patients Only)       Balance Overall balance assessment: Needs assistance Sitting-balance support: Feet supported;No upper extremity supported Sitting balance-Leahy Scale: Good Sitting balance - Comments: Static sitting EOB   Standing balance support: Bilateral upper extremity supported;During functional  activity Standing balance-Leahy Scale: Poor Standing balance comment: Requires lofstrand crutches and CGA to steady with fatigue  during ambulation                            Cognition Arousal/Alertness: Awake/alert Behavior During Therapy: WFL for tasks assessed/performed Overall Cognitive Status: Impaired/Different from baseline Area of Impairment: Safety/judgement                       Following Commands: Follows one step commands consistently Safety/Judgement: Decreased awareness of safety;Decreased awareness of deficits Awareness: Emergent Problem Solving: Slow processing;Requires verbal cues;Requires tactile cues General Comments: Pt is appropriate conversationally, he is able to follow all commands. He still lacks insight into deificts.      Exercises Other Exercises Other Exercises: Pt education for safe d/c home: safety precautions regarding ambulation, muscular fatigue, setting limitations to current activity tolerance, precautions to take with showering/bathroom mobility. Spoke to MD regarding d/c plans, safety information provided and need for tub transfer bench, [facility] therapy services and recommendation for  supervision with all out of bed mobility. Pt picked up phone on 2 occasions to play games as PT was educating - PT instructed pt to put phone away.    General Comments        Pertinent Vitals/Pain Pain Assessment: No/denies pain    Home Living Family/patient expects to be discharged to:: Private residence Living Arrangements: Other relatives (brother) Available Help at Discharge: Family;Available PRN/intermittently (brother and "soon to be" ex-wife) Type of Home: House Home Access: Ramped entrance   Home Layout: One level Home Equipment: Other (comment) (lofstrand crutches) Additional Comments: Uses lofstrand crutches at baseline    Prior Function Level of Independence: Needs assistance  Gait / Transfers Assistance Needed: limited ambulation distances ADL's / Homemaking Assistance Needed: assist by brother and wife Comments: Limited mobility; states he typically  remains in the house   PT Goals (current goals can now be found in the care plan section) Acute Rehab PT Goals Patient Stated Goal: to return to PLOF PT Goal Formulation: With patient    Frequency    Min 2X/week      PT Plan      Co-evaluation              AM-PAC PT "6 Clicks" Mobility   Outcome Measure  Help needed turning from your back to your side while in a flat bed without using bedrails?: A Little Help needed moving from lying on your back to sitting on the side of a flat bed without using bedrails?: A Little Help needed moving to and from a bed to a chair (including a wheelchair)?: A Little Help needed standing up from a chair using your arms (e.g., wheelchair or bedside chair)?: A Little Help needed to walk in hospital room?: A Little Help needed climbing 3-5 steps with a railing? : Total 6 Click Score: 16    End of Session Equipment Utilized During Treatment: Gait belt Activity Tolerance: Patient tolerated treatment well;Patient limited by fatigue Patient left: with call bell/phone within reach;in bed;with bed alarm set Nurse Communication: Mobility status;Precautions PT Visit Diagnosis: Unsteadiness on feet (R26.81);Muscle weakness (generalized) (M62.81);Difficulty in walking, not elsewhere classified (R26.2);Other abnormalities of gait and mobility (R26.89)     Time: 2979-8921 PT Time Calculation (min) (ACUTE ONLY): 35 min  Charges:  $Gait Training: 8-22 mins $Therapeutic Activity: 8-22 mins  Basilia Jumbo PT, DPT 10/20/20 12:21 PM [phone]

## 2020-10-20 NOTE — Progress Notes (Signed)
Patient is being discharged home with brother. Ivs have been removed. Discharge instructions explained and patient educated. A copy of discharge instruction given to patient. Brother will be his transportation home.

## 2023-08-06 ENCOUNTER — Emergency Department

## 2023-08-06 ENCOUNTER — Other Ambulatory Visit: Payer: Self-pay

## 2023-08-06 ENCOUNTER — Inpatient Hospital Stay
Admission: EM | Admit: 2023-08-06 | Discharge: 2023-08-09 | DRG: 682 | Disposition: A | Discharge status: Home Health | Attending: Obstetrics and Gynecology | Admitting: Obstetrics and Gynecology

## 2023-08-06 ENCOUNTER — Inpatient Hospital Stay

## 2023-08-06 DIAGNOSIS — N179 Acute kidney failure, unspecified: Principal | ICD-10-CM | POA: Diagnosis present

## 2023-08-06 DIAGNOSIS — Z79899 Other long term (current) drug therapy: Secondary | ICD-10-CM

## 2023-08-06 DIAGNOSIS — G8929 Other chronic pain: Secondary | ICD-10-CM | POA: Diagnosis present

## 2023-08-06 DIAGNOSIS — Z7982 Long term (current) use of aspirin: Secondary | ICD-10-CM

## 2023-08-06 DIAGNOSIS — Z87891 Personal history of nicotine dependence: Secondary | ICD-10-CM

## 2023-08-06 DIAGNOSIS — Z8249 Family history of ischemic heart disease and other diseases of the circulatory system: Secondary | ICD-10-CM

## 2023-08-06 DIAGNOSIS — R338 Other retention of urine: Secondary | ICD-10-CM | POA: Diagnosis not present

## 2023-08-06 DIAGNOSIS — E1122 Type 2 diabetes mellitus with diabetic chronic kidney disease: Secondary | ICD-10-CM | POA: Diagnosis present

## 2023-08-06 DIAGNOSIS — Z79891 Long term (current) use of opiate analgesic: Secondary | ICD-10-CM

## 2023-08-06 DIAGNOSIS — Z833 Family history of diabetes mellitus: Secondary | ICD-10-CM | POA: Diagnosis not present

## 2023-08-06 DIAGNOSIS — B952 Enterococcus as the cause of diseases classified elsewhere: Secondary | ICD-10-CM | POA: Diagnosis present

## 2023-08-06 DIAGNOSIS — E669 Obesity, unspecified: Secondary | ICD-10-CM | POA: Diagnosis present

## 2023-08-06 DIAGNOSIS — G40909 Epilepsy, unspecified, not intractable, without status epilepticus: Secondary | ICD-10-CM | POA: Diagnosis present

## 2023-08-06 DIAGNOSIS — N184 Chronic kidney disease, stage 4 (severe): Secondary | ICD-10-CM | POA: Diagnosis present

## 2023-08-06 DIAGNOSIS — Z885 Allergy status to narcotic agent status: Secondary | ICD-10-CM

## 2023-08-06 DIAGNOSIS — Z8673 Personal history of transient ischemic attack (TIA), and cerebral infarction without residual deficits: Secondary | ICD-10-CM | POA: Diagnosis not present

## 2023-08-06 DIAGNOSIS — N319 Neuromuscular dysfunction of bladder, unspecified: Secondary | ICD-10-CM | POA: Diagnosis present

## 2023-08-06 DIAGNOSIS — F4310 Post-traumatic stress disorder, unspecified: Secondary | ICD-10-CM | POA: Diagnosis present

## 2023-08-06 DIAGNOSIS — F1190 Opioid use, unspecified, uncomplicated: Secondary | ICD-10-CM | POA: Diagnosis present

## 2023-08-06 DIAGNOSIS — I639 Cerebral infarction, unspecified: Secondary | ICD-10-CM | POA: Diagnosis present

## 2023-08-06 DIAGNOSIS — E1169 Type 2 diabetes mellitus with other specified complication: Secondary | ICD-10-CM | POA: Diagnosis present

## 2023-08-06 DIAGNOSIS — Z1152 Encounter for screening for COVID-19: Secondary | ICD-10-CM

## 2023-08-06 DIAGNOSIS — G9341 Metabolic encephalopathy: Secondary | ICD-10-CM | POA: Diagnosis present

## 2023-08-06 DIAGNOSIS — R5381 Other malaise: Secondary | ICD-10-CM | POA: Diagnosis present

## 2023-08-06 DIAGNOSIS — I160 Hypertensive urgency: Secondary | ICD-10-CM | POA: Diagnosis present

## 2023-08-06 DIAGNOSIS — Z8619 Personal history of other infectious and parasitic diseases: Secondary | ICD-10-CM

## 2023-08-06 DIAGNOSIS — E872 Acidosis, unspecified: Secondary | ICD-10-CM | POA: Diagnosis present

## 2023-08-06 DIAGNOSIS — E739 Lactose intolerance, unspecified: Secondary | ICD-10-CM | POA: Diagnosis present

## 2023-08-06 DIAGNOSIS — R569 Unspecified convulsions: Secondary | ICD-10-CM

## 2023-08-06 DIAGNOSIS — N3001 Acute cystitis with hematuria: Secondary | ICD-10-CM

## 2023-08-06 DIAGNOSIS — E785 Hyperlipidemia, unspecified: Secondary | ICD-10-CM | POA: Diagnosis present

## 2023-08-06 DIAGNOSIS — Z882 Allergy status to sulfonamides status: Secondary | ICD-10-CM

## 2023-08-06 DIAGNOSIS — I129 Hypertensive chronic kidney disease with stage 1 through stage 4 chronic kidney disease, or unspecified chronic kidney disease: Secondary | ICD-10-CM | POA: Diagnosis present

## 2023-08-06 DIAGNOSIS — N3000 Acute cystitis without hematuria: Secondary | ICD-10-CM | POA: Diagnosis present

## 2023-08-06 DIAGNOSIS — R4182 Altered mental status, unspecified: Principal | ICD-10-CM

## 2023-08-06 DIAGNOSIS — Z888 Allergy status to other drugs, medicaments and biological substances status: Secondary | ICD-10-CM

## 2023-08-06 DIAGNOSIS — Q549 Hypospadias, unspecified: Secondary | ICD-10-CM

## 2023-08-06 LAB — DIFFERENTIAL
Abs Immature Granulocytes: 0.03 K/uL (ref 0.00–0.07)
Basophils Absolute: 0 K/uL (ref 0.0–0.1)
Basophils Relative: 0 %
Eosinophils Absolute: 0 K/uL (ref 0.0–0.5)
Eosinophils Relative: 0 %
Immature Granulocytes: 0 %
Lymphocytes Relative: 9 %
Lymphs Abs: 1 K/uL (ref 0.7–4.0)
Monocytes Absolute: 0.7 K/uL (ref 0.1–1.0)
Monocytes Relative: 6 %
Neutro Abs: 9.6 K/uL — ABNORMAL HIGH (ref 1.7–7.7)
Neutrophils Relative %: 85 %

## 2023-08-06 LAB — COMPREHENSIVE METABOLIC PANEL WITH GFR
ALT: 11 U/L (ref 0–44)
AST: 17 U/L (ref 15–41)
Albumin: 3.9 g/dL (ref 3.5–5.0)
Alkaline Phosphatase: 103 U/L (ref 38–126)
Anion gap: 17 — ABNORMAL HIGH (ref 5–15)
BUN: 56 mg/dL — ABNORMAL HIGH (ref 8–23)
CO2: 19 mmol/L — ABNORMAL LOW (ref 22–32)
Calcium: 10.4 mg/dL — ABNORMAL HIGH (ref 8.9–10.3)
Chloride: 104 mmol/L (ref 98–111)
Creatinine, Ser: 4.15 mg/dL — ABNORMAL HIGH (ref 0.61–1.24)
GFR, Estimated: 15 mL/min — ABNORMAL LOW (ref >60)
Glucose, Bld: 124 mg/dL — ABNORMAL HIGH (ref 70–99)
Potassium: 4.5 mmol/L (ref 3.5–5.1)
Sodium: 140 mmol/L (ref 135–145)
Total Bilirubin: 1 mg/dL (ref 0.0–1.2)
Total Protein: 7.7 g/dL (ref 6.5–8.1)

## 2023-08-06 LAB — GLUCOSE, CAPILLARY: Glucose-Capillary: 108 mg/dL — ABNORMAL HIGH (ref 70–99)

## 2023-08-06 LAB — URINALYSIS, W/ REFLEX TO CULTURE (INFECTION SUSPECTED)
Bilirubin Urine: NEGATIVE
Glucose, UA: NEGATIVE mg/dL
Hgb urine dipstick: NEGATIVE
Ketones, ur: 5 mg/dL — AB
Nitrite: NEGATIVE
Protein, ur: >=300 mg/dL — AB
Specific Gravity, Urine: 1.013 (ref 1.005–1.030)
pH: 8 (ref 5.0–8.0)

## 2023-08-06 LAB — PROTIME-INR
INR: 1 (ref 0.8–1.2)
Prothrombin Time: 13.8 s (ref 11.4–15.2)

## 2023-08-06 LAB — LACTIC ACID, PLASMA
Lactic Acid, Venous: 1.5 mmol/L (ref 0.5–1.9)
Lactic Acid, Venous: 0.8 mmol/L (ref 0.5–1.9)

## 2023-08-06 LAB — APTT: aPTT: 31 s (ref 24–36)

## 2023-08-06 LAB — ETHANOL: Alcohol, Ethyl (B): <15 mg/dL (ref <15)

## 2023-08-06 LAB — CBC
HCT: 38.4 % — ABNORMAL LOW (ref 39.0–52.0)
Hemoglobin: 12.8 g/dL — ABNORMAL LOW (ref 13.0–17.0)
MCH: 29.4 pg (ref 26.0–34.0)
MCHC: 33.3 g/dL (ref 30.0–36.0)
MCV: 88.1 fL (ref 80.0–100.0)
Platelets: 284 K/uL (ref 150–400)
RBC: 4.36 MIL/uL (ref 4.22–5.81)
RDW: 12.6 % (ref 11.5–15.5)
WBC: 11.3 K/uL — ABNORMAL HIGH (ref 4.0–10.5)
nRBC: 0 % (ref 0.0–0.2)

## 2023-08-06 LAB — RESP PANEL BY RT-PCR (RSV, FLU A&B, COVID)  RVPGX2
Influenza A by PCR: NEGATIVE
Influenza B by PCR: NEGATIVE
Resp Syncytial Virus by PCR: NEGATIVE
SARS Coronavirus 2 by RT PCR: NEGATIVE

## 2023-08-06 LAB — TROPONIN I (HIGH SENSITIVITY): Troponin I (High Sensitivity): 27 ng/L — ABNORMAL HIGH (ref <18)

## 2023-08-06 LAB — CK: Total CK: 356 U/L (ref 49–397)

## 2023-08-06 MED ORDER — PRAZOSIN HCL 2 MG PO CAPS
4.0000 mg | ORAL_CAPSULE | Freq: Every day | ORAL | Status: DC
  Administered 2023-08-06 – 2023-08-08 (×3): 4 mg via ORAL
  Filled 2023-08-06 (×3): qty 2

## 2023-08-06 MED ORDER — HYDRALAZINE HCL 20 MG/ML IJ SOLN
10.0000 mg | INTRAMUSCULAR | Status: DC | PRN
  Administered 2023-08-06 – 2023-08-07 (×3): 10 mg via INTRAVENOUS
  Filled 2023-08-06 (×3): qty 1

## 2023-08-06 MED ORDER — HEPARIN SODIUM (PORCINE) 5000 UNIT/ML IJ SOLN
5000.0000 [IU] | Freq: Three times a day (TID) | INTRAMUSCULAR | Status: DC
  Administered 2023-08-06 – 2023-08-09 (×10): 5000 [IU] via SUBCUTANEOUS
  Filled 2023-08-06 (×10): qty 1

## 2023-08-06 MED ORDER — ACETAMINOPHEN 325 MG PO TABS
650.0000 mg | ORAL_TABLET | Freq: Four times a day (QID) | ORAL | Status: DC | PRN
  Administered 2023-08-07 – 2023-08-08 (×3): 650 mg via ORAL
  Filled 2023-08-06 (×3): qty 2

## 2023-08-06 MED ORDER — ATORVASTATIN CALCIUM 20 MG PO TABS: 40.0000 mg | ORAL_TABLET | Freq: Every day | ORAL | Status: DC

## 2023-08-06 MED ORDER — ONDANSETRON HCL 4 MG PO TABS
4.0000 mg | ORAL_TABLET | Freq: Four times a day (QID) | ORAL | Status: DC | PRN
  Administered 2023-08-08: 4 mg via ORAL
  Filled 2023-08-06: qty 1

## 2023-08-06 MED ORDER — CLONIDINE HCL 0.3 MG/24HR TD PTWK
0.3000 mg | MEDICATED_PATCH | TRANSDERMAL | Status: DC
  Administered 2023-08-06: 0.3 mg via TRANSDERMAL
  Filled 2023-08-06: qty 1

## 2023-08-06 MED ORDER — LORAZEPAM 2 MG/ML IJ SOLN
2.0000 mg | Freq: Once | INTRAMUSCULAR | Status: AC
  Administered 2023-08-06: 2 mg via INTRAVENOUS
  Filled 2023-08-06: qty 1

## 2023-08-06 MED ORDER — SODIUM CHLORIDE 0.9 % IV SOLN: INTRAVENOUS | Status: AC

## 2023-08-06 MED ORDER — PRAZOSIN HCL 1 MG PO CAPS: 1.0000 mg | ORAL_CAPSULE | Freq: Every evening | ORAL | Status: DC | PRN

## 2023-08-06 MED ORDER — SODIUM CHLORIDE 0.9 % IV SOLN
1.0000 g | Freq: Once | INTRAVENOUS | Status: AC
  Administered 2023-08-06: 1 g via INTRAVENOUS
  Filled 2023-08-06: qty 20

## 2023-08-06 MED ORDER — SODIUM CHLORIDE 0.9% FLUSH
3.0000 mL | Freq: Once | INTRAVENOUS | Status: AC
  Administered 2023-08-06: 3 mL via INTRAVENOUS

## 2023-08-06 MED ORDER — ONDANSETRON HCL 4 MG/2ML IJ SOLN
4.0000 mg | Freq: Four times a day (QID) | INTRAMUSCULAR | Status: DC | PRN
  Administered 2023-08-06: 4 mg via INTRAVENOUS
  Filled 2023-08-06 (×2): qty 2

## 2023-08-06 MED ORDER — MIDAZOLAM HCL 2 MG/2ML IJ SOLN
2.0000 mg | Freq: Once | INTRAMUSCULAR | Status: AC
  Administered 2023-08-06: 2 mg via INTRAVENOUS
  Filled 2023-08-06: qty 2

## 2023-08-06 MED ORDER — ALBUTEROL SULFATE (2.5 MG/3ML) 0.083% IN NEBU: 2.5000 mg | INHALATION_SOLUTION | RESPIRATORY_TRACT | Status: DC | PRN

## 2023-08-06 MED ORDER — AMLODIPINE BESYLATE 10 MG PO TABS
10.0000 mg | ORAL_TABLET | Freq: Every day | ORAL | Status: DC
  Administered 2023-08-07 – 2023-08-09 (×3): 10 mg via ORAL
  Filled 2023-08-06 (×3): qty 1

## 2023-08-06 MED ORDER — LABETALOL HCL 100 MG PO TABS
200.0000 mg | ORAL_TABLET | Freq: Three times a day (TID) | ORAL | Status: DC
  Administered 2023-08-06 – 2023-08-09 (×8): 200 mg via ORAL
  Filled 2023-08-06 (×8): qty 2

## 2023-08-06 MED ORDER — INSULIN ASPART 100 UNIT/ML IJ SOLN: 0.0000 [IU] | Freq: Three times a day (TID) | INTRAMUSCULAR | Status: DC

## 2023-08-06 MED ORDER — LACTATED RINGERS IV SOLN: INTRAVENOUS | Status: AC

## 2023-08-06 MED ORDER — SODIUM CHLORIDE 0.9 % IV SOLN
500.0000 mg | Freq: Two times a day (BID) | INTRAVENOUS | Status: DC
  Administered 2023-08-07 – 2023-08-09 (×6): 500 mg via INTRAVENOUS
  Filled 2023-08-06 (×7): qty 10

## 2023-08-06 MED ORDER — ACETAMINOPHEN 650 MG RE SUPP: 650.0000 mg | Freq: Four times a day (QID) | RECTAL | Status: DC | PRN

## 2023-08-06 MED ORDER — SODIUM CHLORIDE 0.9 % IV SOLN: 2.0000 g | Freq: Once | INTRAVENOUS | Status: DC

## 2023-08-06 MED ORDER — LACTATED RINGERS IV BOLUS
1000.0000 mL | Freq: Once | INTRAVENOUS | Status: AC
  Administered 2023-08-06: 1000 mL via INTRAVENOUS

## 2023-08-06 MED ORDER — ASPIRIN 81 MG PO CHEW
81.0000 mg | CHEWABLE_TABLET | Freq: Every day | ORAL | Status: DC
  Administered 2023-08-07: 81 mg via ORAL
  Filled 2023-08-06: qty 1

## 2023-08-06 NOTE — Progress Notes (Addendum)
 Pt arrived to unit, VSS at this time. Pt confused and disoriented x 4. Pt being transported to ultrasound at this time. MD Debby notified of BP 187/85.

## 2023-08-06 NOTE — ED Triage Notes (Signed)
 Pt was found at home to be confused. Per EMS family sts that pt has last seen normal at 0830 this AM. Pt was ambulatory on scene as well as having N/V/D. Pt is A/Ox2.

## 2023-08-06 NOTE — ED Provider Notes (Signed)
 [facility] Provider Note    Event Date/Time   First MD Initiated Contact with Patient 08/06/23 1125     (approximate)   History   Altered Mental Status   HPI  Wyatt Ball. is a 63 y.o. male past medical history significant for CVA, history of PRESS, PTSD, history of seizure, CKD, hypospadias with need for In-N-Out catheterization, who presents to the emergency department with altered mental status.  History is provided by the patient's daughter who is at bedside.  Patient's daughter states that she last talked to him 2 days ago.  States that she was called by her uncle today which is who he lives with and was told that he has been having a fever for 2 days and vomiting and needed to come to the emergency department.  States that whenever she arrived to his uncles house he was wandering around the room, confused altered and yelling for help.  Last known well was on Wednesday.  Uncertain of any falls or head trauma.  She states that he had 1 prior episode when he was hospitalized in 2022 and is normally followed by the [facility].  On review of his home medications on his phone I do not see that he is on any anticoagulation.       Physical Exam   Triage Vital Signs: ED Triage Vitals  Encounter Vitals Group     BP 08/06/23 1130 (!) 223/94     Girls Systolic BP Percentile --      Girls Diastolic BP Percentile --      Boys Systolic BP Percentile --      Boys Diastolic BP Percentile --      Pulse Rate 08/06/23 1130 (!) 57     Resp 08/06/23 1130 20     Temp 08/06/23 1130 98.2 F (36.8 C)     Temp Source 08/06/23 1130 Oral     SpO2 08/06/23 1130 95 %     Weight 08/06/23 1126 220 lb (99.8 kg)     Height --      Head Circumference --      Peak Flow --      Pain Score --      Pain Loc --      Pain Education --      Exclude from Growth Chart --     Most recent vital signs: Vitals:   08/06/23 1500 08/06/23 1530  BP: (!) 171/72 (!) 181/70  Pulse: 88  83  Resp: (!) 28 (!) 26  Temp:  98.5 F (36.9 C)  SpO2: 94% 96%    Physical Exam Constitutional:      General: He is in acute distress.     Appearance: He is well-developed. He is ill-appearing.  HENT:     Head: Atraumatic.  Eyes:     Conjunctiva/sclera: Conjunctivae normal.  Cardiovascular:     Rate and Rhythm: Regular rhythm.  Pulmonary:     Effort: No respiratory distress.     Breath sounds: No wheezing.  Abdominal:     Tenderness: There is abdominal tenderness. There is guarding.  Musculoskeletal:     Cervical back: Normal range of motion.  Skin:    General: Skin is warm.  Neurological:     General: No focal deficit present.     Mental Status: He is alert. He is disoriented.     IMPRESSION / MDM / ASSESSMENT AND PLAN / ED COURSE  I reviewed the triage vital signs and  the nursing notes.  On arrival patient significantly altered and hypertensive with a blood pressure of 220/94.  Last known well was on Wednesday.  Outside of the window for TNK and does not meet criteria for LVO  Immediately taken back for CT scan of the head to further evaluate for possible intracranial hemorrhage  Differential diagnosis including intracranial hemorrhage, PRESS, infectious process, electrolyte abnormality  Bladder scan with greater than 500 in the bladder  Attempted In-N-Out catheterization was unsuccessful, patient has hypospadias and required urology cart for a smaller Foley catheter size  EKG  I, Clotilda Punter, the attending physician, personally viewed and interpreted this ECG.   Rate: Normal  Rhythm: Normal sinus  Axis: Normal  Intervals: Normal  ST&T Change: None  No tachycardic or bradycardic dysrhythmias while on cardiac telemetry.  RADIOLOGY I independently reviewed imaging, my interpretation of imaging: CT scan of the head with no signs of intracranial hemorrhage CT scan of the head was read as no acute findings  Chest x-ray read as no acute  findings   LABS (all labs ordered are listed, but only abnormal results are displayed) Labs interpreted as -    Labs Reviewed  CBC - Abnormal; Notable for the following components:      Result Value   WBC 11.3 (*)    Hemoglobin 12.8 (*)    HCT 38.4 (*)    All other components within normal limits  DIFFERENTIAL - Abnormal; Notable for the following components:   Neutro Abs 9.6 (*)    All other components within normal limits  COMPREHENSIVE METABOLIC PANEL WITH GFR - Abnormal; Notable for the following components:   CO2 19 (*)    Glucose, Bld 124 (*)    BUN 56 (*)    Creatinine, Ser 4.15 (*)    Calcium  10.4 (*)    GFR, Estimated 15 (*)    Anion gap 17 (*)    All other components within normal limits  URINALYSIS, W/ REFLEX TO CULTURE (INFECTION SUSPECTED) - Abnormal; Notable for the following components:   Color, Urine YELLOW (*)    APPearance CLOUDY (*)    Ketones, ur 5 (*)    Protein, ur >=300 (*)    Leukocytes,Ua SMALL (*)    Bacteria, UA MANY (*)    All other components within normal limits  TROPONIN I (HIGH SENSITIVITY) - Abnormal; Notable for the following components:   Troponin I (High Sensitivity) 27 (*)    All other components within normal limits  RESP PANEL BY RT-PCR (RSV, FLU A&B, COVID)  RVPGX2  CULTURE, BLOOD (SINGLE)  URINE CULTURE  CULTURE, BLOOD (SINGLE)  PROTIME-INR  APTT  ETHANOL  LACTIC ACID, PLASMA  CK  LACTIC ACID, PLASMA     MDM Required IV Versed  and IV Ativan  for agitation  Clinical Course as of 08/06/23 1602  Fri Aug 06, 2023  1306 Attempting In-N-Out catheterization, unsuccessful with 14 French Foley.  Bladder scan with 500 in the bladder.  Patient does In-N-Out catheterization at home.  Will attempt to get the urology cart where a smaller Foley catheter for In-N-Out, otherwise will need to consult urology [SM]  1336 Patient with significant altered mental status.  Lab work resulted with significantly elevated acute kidney injury with  creatinine elevated to 4.1 from a baseline of 1.7, BUN elevated at 56.  CO2 is low at 19 with an elevated anion gap of 17.  Does have an elevated calcium  level of 10.4.  Troponin elevated at 27.  Initial lactic  acid 1.5.  Will add on a CK.  Urology called at bedside to attempt In-N-Out catheterization, concerned that acute kidney injury is secondary to bladder outlet obstruction. [SM]  1357 Foley catheter was placed with significant urine output.  Will repeat blood pressure now that he has a Foley catheter and is draining urine with no longer urinary retention.  Will obtain CT scan noncontrasted.  CK within normal limits.  Consulted hospitalist for acute kidney injury and altered mental status. [SM]    Clinical Course User Index [SM] Suzanne Kirsch, MD   UA concerning for urinary tract infection.  Discussed with pharmacy who stated that he has a history of ESBL and recommended meropenem .  Blood cultures added on.  Adding on a lactic acid.  Started on meropenem .  Given IV fluids.  Blood pressure improved after In-N-Out catheterization and Foley was kept in place.  Low suspicion for acute CVA or PRESS.   Consulted hospitalist for admission for urinary retention, acute kidney injury and urinary tract infection with altered mental status.  PROCEDURES:  Critical Care performed: yes  .Critical Care  Performed by: Suzanne Kirsch, MD Authorized by: Suzanne Kirsch, MD   Critical care provider statement:    Critical care time (minutes):  30   Critical care time was exclusive of:  Separately billable procedures and treating other patients   Critical care was necessary to treat or prevent imminent or life-threatening deterioration of the following conditions:  Renal failure   Critical care was time spent personally by me on the following activities:  Development of treatment plan with patient or surrogate, discussions with consultants, evaluation of patient's response to treatment, examination of patient,  ordering and review of laboratory studies, ordering and review of radiographic studies, ordering and performing treatments and interventions, pulse oximetry, re-evaluation of patient's condition and review of old charts   Care discussed with: admitting provider     Patient's presentation is most consistent with acute presentation with potential threat to life or bodily function.   MEDICATIONS ORDERED IN ED: Medications  lactated ringers  infusion (0 mLs Intravenous Hold 08/06/23 1454)  sodium chloride  flush (NS) 0.9 % injection 3 mL (3 mLs Intravenous Given 08/06/23 1226)  midazolam  (VERSED ) injection 2 mg (2 mg Intravenous Given 08/06/23 1225)  LORazepam  (ATIVAN ) injection 2 mg (2 mg Intravenous Given 08/06/23 1321)  lactated ringers  bolus 1,000 mL (1,000 mLs Intravenous New Bag/Given 08/06/23 1448)  meropenem  (MERREM ) 1 g in sodium chloride  0.9 % 100 mL IVPB (0 g Intravenous Stopped 08/06/23 1540)    FINAL CLINICAL IMPRESSION(S) / ED DIAGNOSES   Final diagnoses:  Altered mental status, unspecified altered mental status type  AKI (acute kidney injury) (HCC)  Acute cystitis without hematuria     Rx / DC Orders   ED Discharge Orders     None        Note:  This document was prepared using Dragon voice recognition software and may include unintentional dictation errors.   Suzanne Kirsch, MD 08/06/23 [phone]

## 2023-08-06 NOTE — ED Notes (Signed)
RN to bedside to introduce self to pt. Pt is sleeping at this time.

## 2023-08-06 NOTE — Consult Note (Signed)
 CODE SEPSIS - PHARMACY COMMUNICATION  **Broad Spectrum Antibiotics should be administered within 1 hour of Sepsis diagnosis**  Time Code Sepsis Called/Page Received: 1430  Antibiotics Ordered: meropenem  (Hx of ESBL E.coli in April 04/2022)  Time of 1st antibiotic administration: 1449  Additional action taken by pharmacy: none  If necessary, Name of Provider/Nurse Contacted: n/a    Wyatt Ball PharmD, BCPS 08/06/2023 2:33 PM

## 2023-08-06 NOTE — Progress Notes (Signed)
 Pt returned from ultrasound

## 2023-08-06 NOTE — ED Notes (Signed)
 This nurse attempted I&O cath per request primary RN Penne. Pt has noted hypospadias. Attempt made and able to advance approximately 3 inches down shaft of penis and unable to advance further despite lube and manipulation. MD Mumma notified of the same. Bladder scan showing full bladder per Penne, RN. Urology cart called for by [facility]. Attempt with smallest catheter planned on its arrival.

## 2023-08-06 NOTE — H&P (Signed)
 History and Physical    [employer] FMW:[id number] DOB: [birth year] DOA: 08/06/2023  PCP: System, Provider Not In  Patient coming from: home  I have personally briefly reviewed patient's old medical records in [facility]  Chief Complaint:  confusion/delirium/n/v/d  HPI: Wyatt Ball. is a 63 y.o. male with medical history significant of Anxiety, DMII , HTN,PTSD, hx of transverse myelitis, CVA, PRES, Seizure d/o , CKDIIla-b,  hx of urinary retention in setting of hypospadias, on CIC, who presents to ED after family noted patient having acute mental status change as well as n/v/d. Per family patient 2 days ago started running a fever and having intermittent n/v. Today patient became acutely agitated and spacing around the room and yelling.. IN the field EMS notes patient is ambulatory with stable vitals but with episodes of n/v/d per triage note.  Patient currently notes no sob/n/v/ does have some mild abdominal pain. Somnolent not able to give history. But noted to be calm and follows commands well.   ED Course:  Vitals 98.2, Bp 223/94 repeat bp 153/73, hr 57, rr 20 , sat 95%   EKG: nsr  Wbc 11.3, hgg 12.8, pt 284,  Pmn 9.6, inr 1 RVP -neg  ETOH<15 CE :27  Na 140, k 4.5, Cl 104, bicarb 19, glu 124, cr 4.15,  AG17  CK 356 CXR: NAD CTH: IMPRESSION: 1. No acute intracranial abnormality. 2. Normalized gray-white differentiation compared to 2022 CT/MRI when PRES was suspected on imaging.  UA :  wbc 11-20 , + bacteria , wbc clumps Tx versed , lorazepam , merrem  LR   Review of Systems: As per HPI otherwise 10 point review of systems negative.   Past Medical History:  Diagnosis Date   Anxiety    DM (diabetes mellitus) (HCC)    HTN (hypertension)    Transverse myelitis (HCC)     Past Surgical History:  Procedure Laterality Date   PEG PLACEMENT N/A 07/03/2020   Procedure: PERCUTANEOUS ENDOSCOPIC GASTROSTOMY (PEG) PLACEMENT;  Surgeon: Therisa Bi, MD;   Location: [facility] ENDOSCOPY;  Service: Gastroenterology;  Laterality: N/A;   TONSILLECTOMY     TOTAL HIP ARTHROPLASTY       reports that he has been smoking. He has never used smokeless tobacco. He reports that he does not drink alcohol. No history on file for drug use.  Allergies  Allergen Reactions   Lactose Intolerance (Gi) Nausea And Vomiting   Methadone Nausea And Vomiting   Molds & Smuts Nausea And Vomiting   Sulfa Antibiotics Rash and Hives    Other reaction(s): Urticaria, ANGIOEDEMA OF LIPS    Family History  Problem Relation Age of Onset   CAD Mother    Diabetes Mother    CAD Father    Diabetes Father    CAD Brother    Diabetes Brother     Prior to Admission medications   Medication Sig Start Date End Date Taking? Authorizing Provider  amLODipine  (NORVASC ) 10 MG tablet Take 1 tablet (10 mg total) by mouth daily. 10/20/20   Josette Ade, MD  aspirin  81 MG chewable tablet Chew 81 mg by mouth daily.    [provider]  atorvastatin  (LIPITOR) 40 MG tablet Take 1 tablet (40 mg total) by mouth daily. 10/21/20   Josette Ade, MD  cloNIDine  (CATAPRES  - DOSED IN MG/24 HR) 0.3 mg/24hr patch Place 1 patch (0.3 mg total) onto the skin once a week. 10/20/20   Josette Ade, MD  cyclobenzaprine  (FLEXERIL ) 10 MG tablet Take 10 mg  by mouth 3 (three) times daily as needed for muscle spasms.    [provider]  Docusate Sodium  (DSS) 100 MG CAPS Take 200 mg by mouth 2 (two) times daily.    [provider]  DULoxetine  (CYMBALTA ) 60 MG capsule Take 1 capsule (60 mg total) by mouth daily. 10/20/20   Josette Ade, MD  gabapentin  (NEURONTIN ) 100 MG capsule Take 1 capsule (100 mg total) by mouth 2 (two) times daily with breakfast and lunch. 10/20/20   Josette Ade, MD  hydrOXYzine  (ATARAX /VISTARIL ) 50 MG tablet Take 50 mg by mouth every 6 (six) hours as needed for anxiety.    [provider]  labetalol  (NORMODYNE ) 200 MG tablet Take 1 tablet  (200 mg total) by mouth 3 (three) times daily. 10/20/20   Josette Ade, MD  latanoprost  (XALATAN ) 0.005 % ophthalmic solution Place 2 drops into both eyes at bedtime.    [provider]  levETIRAcetam  (KEPPRA ) 500 MG tablet Take 1 tablet (500 mg total) by mouth 2 (two) times daily. 10/20/20   Josette Ade, MD  loratadine  (CLARITIN ) 10 MG tablet Take 10 mg by mouth daily.    [provider]  morphine  (MS CONTIN ) 15 MG 12 hr tablet Take 15 mg by mouth every 12 (twelve) hours.    [provider]  Nutritional Supplements (FEEDING SUPPLEMENT, NEPRO CARB STEADY,) LIQD Take 237 mLs by mouth 3 (three) times daily between meals. 10/20/20   Josette Ade, MD  omeprazole (PRILOSEC) 20 MG capsule Take 20 mg by mouth 2 (two) times daily before a meal.    [provider]  oxyCODONE -acetaminophen  (PERCOCET/ROXICET) 5-325 MG tablet Take 1 tablet by mouth 2 (two) times daily as needed for severe pain.    [provider]  prazosin  (MINIPRESS ) 1 MG capsule Take 1 mg by mouth 2 (two) times daily as needed (PTSD symptoms). May take an additional 1 mg tablet along with 4 mg at bedtime as needed    [provider]  prazosin  (MINIPRESS ) 2 MG capsule Take 4 mg by mouth at bedtime. May take an additional 1 mg tablet with 4 mg at bedtime as needed    [provider]  promethazine  (PHENERGAN ) 25 MG tablet Take 25 mg by mouth 3 (three) times daily as needed for nausea.    [provider]  QUEtiapine  (SEROQUEL ) 25 MG tablet Take 1 tablet (25 mg total) by mouth at bedtime. 10/20/20   Josette Ade, MD  traZODone  (DESYREL ) 150 MG tablet Take 150 mg by mouth at bedtime.    [provider]    Physical Exam: Vitals:   08/06/23 1130 08/06/23 1400 08/06/23 1500 08/06/23 1530  BP: (!) 223/94 (!) 153/73 (!) 171/72 (!) 181/70  Pulse: (!) 57 93 88 83  Resp: 20 18 (!) 28 (!) 26  Temp: 98.2 F (36.8 C)   98.5 F (36.9 C)  TempSrc: Oral    Oral  SpO2: 95% 95% 94% 96%  Weight:        Constitutional: NAD, calm, comfortable Vitals:   08/06/23 1130 08/06/23 1400 08/06/23 1500 08/06/23 1530  BP: (!) 223/94 (!) 153/73 (!) 171/72 (!) 181/70  Pulse: (!) 57 93 88 83  Resp: 20 18 (!) 28 (!) 26  Temp: 98.2 F (36.8 C)   98.5 F (36.9 C)  TempSrc: Oral   Oral  SpO2: 95% 95% 94% 96%  Weight:       Eyes: Pupils equal, lids and conjunctivae normal ENMT: Mucous membranes are moist. Posterior  pharynx clear of any exudate or lesions.Normal dentition.  Neck: normal, supple, no masses, no thyromegaly Respiratory: clear to auscultation bilaterally, no wheezing, no crackles. Normal respiratory effort. No accessory muscle use.  Cardiovascular: Regular rate and rhythm, no murmurs / rubs / gallops. No extremity edema. 2+ pedal pulses. Abdomen: mild suprapubic tenderness, no masses palpated. No hepatosplenomegaly. Bowel sounds positive.  Musculoskeletal: no clubbing / cyanosis. No joint deformity upper and lower extremities. Good ROM, no contractures. Normal muscle tone.  Skin: no rashes, lesions, ulcers. No induration Neurologic: CN 2-12 grossly intact. Sensation intact, MAEX4 Psychiatric: calm unable to fully assess    Labs on Admission: I have personally reviewed following labs and imaging studies  CBC: Recent Labs  Lab 08/06/23 1147  WBC 11.3*  NEUTROABS 9.6*  HGB 12.8*  HCT 38.4*  MCV 88.1  PLT 284   Basic Metabolic Panel: Recent Labs  Lab 08/06/23 1147  NA 140  K 4.5  CL 104  CO2 19*  GLUCOSE 124*  BUN 56*  CREATININE 4.15*  CALCIUM  10.4*   GFR: CrCl cannot be calculated (Unknown ideal weight.). Liver Function Tests: Recent Labs  Lab 08/06/23 1147  AST 17  ALT 11  ALKPHOS 103  BILITOT 1.0  PROT 7.7  ALBUMIN  3.9   No results for input(s): LIPASE, AMYLASE in the last 168 hours. No results for input(s): AMMONIA in the last 168 hours. Coagulation Profile: Recent Labs  Lab 08/06/23 1147  INR 1.0    Cardiac Enzymes: Recent Labs  Lab 08/06/23 1147  CKTOTAL 356   BNP (last 3 results) No results for input(s): PROBNP in the last 8760 hours. HbA1C: No results for input(s): HGBA1C in the last 72 hours. CBG: No results for input(s): GLUCAP in the last 168 hours. Lipid Profile: No results for input(s): CHOL, HDL, LDLCALC, TRIG, CHOLHDL, LDLDIRECT in the last 72 hours. Thyroid Function Tests: No results for input(s): TSH, T4TOTAL, FREET4, T3FREE, THYROIDAB in the last 72 hours. Anemia Panel: No results for input(s): VITAMINB12, FOLATE, FERRITIN, TIBC, IRON, RETICCTPCT in the last 72 hours. Urine analysis:    Component Value Date/Time   COLORURINE YELLOW (A) 08/06/2023 1405   APPEARANCEUR CLOUDY (A) 08/06/2023 1405   LABSPEC 1.013 08/06/2023 1405   PHURINE 8.0 08/06/2023 1405   GLUCOSEU NEGATIVE 08/06/2023 1405   HGBUR NEGATIVE 08/06/2023 1405   BILIRUBINUR NEGATIVE 08/06/2023 1405   KETONESUR 5 (A) 08/06/2023 1405   PROTEINUR >=300 (A) 08/06/2023 1405   NITRITE NEGATIVE 08/06/2023 1405   LEUKOCYTESUR SMALL (A) 08/06/2023 1405    Radiological Exams on Admission: CT HEAD WO CONTRAST Result Date: 08/06/2023 CLINICAL DATA:  63 year old male with altered mental status, found confused at home. EXAM: CT HEAD WITHOUT CONTRAST TECHNIQUE: Contiguous axial images were obtained from the base of the skull through the vertex without intravenous contrast. RADIATION DOSE REDUCTION: This exam was performed according to the departmental dose-optimization program which includes automated exposure control, adjustment of the mA and/or kV according to patient size and/or use of iterative reconstruction technique. COMPARISON:  Brain MRI, Head CT 10/14/2020. FINDINGS: Brain: Stable cerebral volume since 2022. Normalized gray-white differentiation since that time when posterior reversible encephalopathy syndrome (PRES) was suspected by CT and MRI. No midline shift,  ventriculomegaly, mass effect, evidence of mass lesion, intracranial hemorrhage or evidence of cortically based acute infarction. No cortical encephalomalacia identified. Vascular: Calcified atherosclerosis at the skull base. No suspicious intracranial vascular hyperdensity. Skull: Mild motion artifact. No acute osseous abnormality identified. Sinuses/Orbits: Visualized paranasal sinuses and mastoids  are clear. Other: No acute orbit or scalp soft tissue finding. IMPRESSION: 1. No acute intracranial abnormality. 2. Normalized gray-white differentiation compared to 2022 CT/MRI when PRES was suspected on imaging. Electronically Signed   By: VEAR Hurst M.D.   On: 08/06/2023 12:25   DG Chest Port 1 View Result Date: 08/06/2023 CLINICAL DATA:  Questionable sepsis - evaluate for abnormality. Altered mental status. EXAM: PORTABLE CHEST 1 VIEW COMPARISON:  10/14/2020. FINDINGS: Low lung volume. Bilateral lung fields are clear. Bilateral costophrenic angles are clear. Stable cardio-mediastinal silhouette. No acute osseous abnormalities. Right reverse shoulder arthroplasty noted. The soft tissues are within normal limits. IMPRESSION: No active disease. Electronically Signed   By: Ree Molt M.D.   On: 08/06/2023 12:07    EKG: Independently reviewed.   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 -low volume in setting  of n/v due to UTI  -continue with ivfs  - monitor strict I/o  -f/u with renal u/s r/o pyelonephritis   Urinary retention  -s/p foley cath now resolved  -voiding trial as appropriate - patient on CIC at home but with delirium was not able to complete  Acute metabolic encephalopathy/delirium  -in setting of UTI  -treat underlying cause    HTN  Hx of PRES -initially in ED bp elevated but improved s/p resolution of urinary retention  -resume home regimen once med rec completed  - prn Hydralazine    Hx  of CVA -continue on secondary ppx   Hx of Seizure d/o  -continue on keppra  and gabapentin   DVT prophylaxis:  heparin  Code Status: full/ as discussed per patient wishes in event of cardiac arrest  Family Communication:  full/ as discussed per patient wishes in event of cardiac arrest  Disposition Plan: patient  expected to be admitted greater than 2 midnights  Consults called: n/a Admission status: med tele   Camila DELENA Ned MD [facility]   If 7PM-7AM, please contact night-coverage [email or web address] Password [email or web address]  08/06/2023, 4:10 PM

## 2023-08-06 NOTE — ED Notes (Signed)
 RN attempted to get In and out cath with no successes. RN notified MD.

## 2023-08-06 NOTE — Plan of Care (Signed)

## 2023-08-06 NOTE — Sepsis Progress Note (Signed)
 eLink is following this Code Sepsis.

## 2023-08-07 ENCOUNTER — Inpatient Hospital Stay

## 2023-08-07 DIAGNOSIS — I10 Essential (primary) hypertension: Secondary | ICD-10-CM | POA: Insufficient documentation

## 2023-08-07 DIAGNOSIS — G4730 Sleep apnea, unspecified: Secondary | ICD-10-CM | POA: Insufficient documentation

## 2023-08-07 DIAGNOSIS — G373 Acute transverse myelitis in demyelinating disease of central nervous system: Secondary | ICD-10-CM | POA: Insufficient documentation

## 2023-08-07 LAB — COMPREHENSIVE METABOLIC PANEL WITH GFR
ALT: 11 U/L (ref 0–44)
AST: 17 U/L (ref 15–41)
Albumin: 3.5 g/dL (ref 3.5–5.0)
Alkaline Phosphatase: 82 U/L (ref 38–126)
Anion gap: 15 (ref 5–15)
BUN: 61 mg/dL — ABNORMAL HIGH (ref 8–23)
CO2: 20 mmol/L — ABNORMAL LOW (ref 22–32)
Calcium: 9.2 mg/dL (ref 8.9–10.3)
Chloride: 108 mmol/L (ref 98–111)
Creatinine, Ser: 3.96 mg/dL — ABNORMAL HIGH (ref 0.61–1.24)
GFR, Estimated: 16 mL/min — ABNORMAL LOW (ref >60)
Glucose, Bld: 117 mg/dL — ABNORMAL HIGH (ref 70–99)
Potassium: 4.2 mmol/L (ref 3.5–5.1)
Sodium: 143 mmol/L (ref 135–145)
Total Bilirubin: 0.6 mg/dL (ref 0.0–1.2)
Total Protein: 6.7 g/dL (ref 6.5–8.1)

## 2023-08-07 LAB — URINE DRUG SCREEN, QUALITATIVE (ARMC ONLY)
Amphetamines, Ur Screen: NOT DETECTED
Barbiturates, Ur Screen: NOT DETECTED
Benzodiazepine, Ur Scrn: NOT DETECTED
Cannabinoid 50 Ng, Ur ~~LOC~~: NOT DETECTED
Cocaine Metabolite,Ur ~~LOC~~: NOT DETECTED
MDMA (Ecstasy)Ur Screen: NOT DETECTED
Methadone Scn, Ur: NOT DETECTED
Opiate, Ur Screen: NOT DETECTED
Phencyclidine (PCP) Ur S: NOT DETECTED
Tricyclic, Ur Screen: POSITIVE — AB

## 2023-08-07 LAB — BLOOD CULTURE ID PANEL (REFLEXED) - BCID2

## 2023-08-07 LAB — CBC
HCT: 34.1 % — ABNORMAL LOW (ref 39.0–52.0)
Hemoglobin: 11.1 g/dL — ABNORMAL LOW (ref 13.0–17.0)
MCH: 29 pg (ref 26.0–34.0)
MCHC: 32.6 g/dL (ref 30.0–36.0)
MCV: 89 fL (ref 80.0–100.0)
Platelets: 235 K/uL (ref 150–400)
RBC: 3.83 MIL/uL — ABNORMAL LOW (ref 4.22–5.81)
RDW: 13 % (ref 11.5–15.5)
WBC: 9.1 K/uL (ref 4.0–10.5)
nRBC: 0 % (ref 0.0–0.2)

## 2023-08-07 LAB — GLUCOSE, CAPILLARY
Glucose-Capillary: 104 mg/dL — ABNORMAL HIGH (ref 70–99)
Glucose-Capillary: 130 mg/dL — ABNORMAL HIGH (ref 70–99)
Glucose-Capillary: 133 mg/dL — ABNORMAL HIGH (ref 70–99)

## 2023-08-07 LAB — HIV ANTIBODY (ROUTINE TESTING W REFLEX): HIV Screen 4th Generation wRfx: NONREACTIVE

## 2023-08-07 MED ORDER — LEVETIRACETAM 500 MG PO TABS
500.0000 mg | ORAL_TABLET | Freq: Two times a day (BID) | ORAL | Status: DC
  Administered 2023-08-07 – 2023-08-09 (×5): 500 mg via ORAL
  Filled 2023-08-07 (×5): qty 1

## 2023-08-07 MED ORDER — QUETIAPINE FUMARATE 25 MG PO TABS
25.0000 mg | ORAL_TABLET | Freq: Once | ORAL | Status: AC
  Administered 2023-08-07: 25 mg via ORAL
  Filled 2023-08-07: qty 1

## 2023-08-07 MED ORDER — LABETALOL HCL 5 MG/ML IV SOLN: 20.0000 mg | INTRAVENOUS | Status: DC | PRN

## 2023-08-07 MED ORDER — ATORVASTATIN CALCIUM 20 MG PO TABS
40.0000 mg | ORAL_TABLET | Freq: Every day | ORAL | Status: DC
Start: 2023-08-07 — End: 2023-08-09
  Administered 2023-08-07 – 2023-08-08 (×2): 40 mg via ORAL
  Filled 2023-08-07 (×2): qty 2

## 2023-08-07 MED ORDER — LORAZEPAM 2 MG/ML IJ SOLN
1.0000 mg | Freq: Once | INTRAMUSCULAR | Status: AC | PRN
  Administered 2023-08-07: 1 mg via INTRAVENOUS
  Filled 2023-08-07: qty 1

## 2023-08-07 MED ORDER — GABAPENTIN 100 MG PO CAPS
100.0000 mg | ORAL_CAPSULE | Freq: Two times a day (BID) | ORAL | Status: DC
  Administered 2023-08-08 (×2): 100 mg via ORAL
  Filled 2023-08-07 (×2): qty 1

## 2023-08-07 MED ORDER — CHLORHEXIDINE GLUCONATE CLOTH 2 % EX PADS
6.0000 | MEDICATED_PAD | Freq: Every day | CUTANEOUS | Status: DC
  Administered 2023-08-07 – 2023-08-09 (×3): 6 via TOPICAL

## 2023-08-07 MED ORDER — DULOXETINE HCL 30 MG PO CPEP
60.0000 mg | ORAL_CAPSULE | Freq: Every day | ORAL | Status: DC
Start: 2023-08-07 — End: 2023-08-09
  Administered 2023-08-07 – 2023-08-09 (×3): 60 mg via ORAL
  Filled 2023-08-07 (×3): qty 2

## 2023-08-07 MED ORDER — HYDRALAZINE HCL 25 MG PO TABS: 25.0000 mg | ORAL_TABLET | Freq: Once | ORAL | Status: DC | PRN

## 2023-08-07 NOTE — Progress Notes (Addendum)
 PHARMACY - PHYSICIAN COMMUNICATION CRITICAL VALUE ALERT - BLOOD CULTURE IDENTIFICATION (BCID)  Wyatt Ball. is an 63 y.o. male who presented to [facility] on 08/06/2023 with a chief complaint of AKI and UTI.  Assessment:  1/3 bottles (aerobic only) growing GPC, BCID = Staph species  Name of physician (or Provider) Contacted: Dr. Kandis  Current antibiotics: Meropenem  500 mg IV every 12 hours   Changes to prescribed antibiotics recommended:  No change to current antibiotics at this time.  Results for orders placed or performed during the hospital encounter of 08/06/23  Blood Culture ID Panel (Reflexed) (Collected: 08/06/2023 11:47 AM)  Result Value Ref Range   Enterococcus faecalis NOT DETECTED NOT DETECTED   Enterococcus Faecium NOT DETECTED NOT DETECTED   Listeria monocytogenes NOT DETECTED NOT DETECTED   Staphylococcus species DETECTED (A) NOT DETECTED   Staphylococcus aureus (BCID) NOT DETECTED NOT DETECTED   Staphylococcus epidermidis NOT DETECTED NOT DETECTED   Staphylococcus lugdunensis NOT DETECTED NOT DETECTED   Streptococcus species NOT DETECTED NOT DETECTED   Streptococcus agalactiae NOT DETECTED NOT DETECTED   Streptococcus pneumoniae NOT DETECTED NOT DETECTED   Streptococcus pyogenes NOT DETECTED NOT DETECTED   A.calcoaceticus-baumannii NOT DETECTED NOT DETECTED   Bacteroides fragilis NOT DETECTED NOT DETECTED   Enterobacterales NOT DETECTED NOT DETECTED   Enterobacter cloacae complex NOT DETECTED NOT DETECTED   Escherichia coli NOT DETECTED NOT DETECTED   Klebsiella aerogenes NOT DETECTED NOT DETECTED   Klebsiella oxytoca NOT DETECTED NOT DETECTED   Klebsiella pneumoniae NOT DETECTED NOT DETECTED   Proteus species NOT DETECTED NOT DETECTED   Salmonella species NOT DETECTED NOT DETECTED   Serratia marcescens NOT DETECTED NOT DETECTED   Haemophilus influenzae NOT DETECTED NOT DETECTED   Neisseria meningitidis NOT DETECTED NOT DETECTED   Pseudomonas aeruginosa  NOT DETECTED NOT DETECTED   Stenotrophomonas maltophilia NOT DETECTED NOT DETECTED   Candida albicans NOT DETECTED NOT DETECTED   Candida auris NOT DETECTED NOT DETECTED   Candida glabrata NOT DETECTED NOT DETECTED   Candida krusei NOT DETECTED NOT DETECTED   Candida parapsilosis NOT DETECTED NOT DETECTED   Candida tropicalis NOT DETECTED NOT DETECTED   Cryptococcus neoformans/gattii NOT DETECTED NOT DETECTED    Kayla JULIANNA Blew 08/07/2023  5:46 PM

## 2023-08-07 NOTE — Progress Notes (Signed)
 PROGRESS NOTE    [employer]  FMW:[id number] DOB: 1960/09/07 DOA: 08/06/2023 PCP: System, Provider Not In  [facility]    Brief Narrative:   Wyatt Ball. is a 63 y.o. male with medical history significant of Anxiety, DMII , HTN,PTSD, hx of transverse myelitis, CVA, PRES, Seizure d/o , CKDIIla-b,  hx of urinary retention in setting of hypospadias, on CIC, who presents to ED after family noted patient having acute mental status change as well as n/v/d. Per family patient 2 days ago started running a fever and having intermittent n/v. Today patient became acutely agitated and spacing around the room and yelling.. IN the field EMS notes patient is ambulatory with stable vitals but with episodes of n/v/d per triage note.  Patient currently notes no sob/n/v/ does have some mild abdominal pain. Somnolent not able to give history. But noted to be calm and follows commands well.    Assessment & Plan:   Principal Problem:   AKI (acute kidney injury) (HCC) Active Problems:   Chronic, continuous use of opioids   Type 2 diabetes mellitus with hyperlipidemia (HCC)   Stroke (cerebrum) (HCC)   Seizure (HCC)  # Acute encephalopathy Etiology unclear. Was admitted with similar presentation in 2022 and diagnosed with PRES. May have UTI. Kidney function is a bit off from his baseline. No significant electrolyte abnormality. Recent diarrheal illness but no report of vomiting or diarrhea here. Is on several meds that can contribute to encephalopathy but daughter denies med changes. Does have hx seizure - MRI brain w/ contrast - monitor cultures - uds - EEG - has a sitter  # AKI on ckd 4 Baseline cr appears to be in the 3s as of late per [place] records. Here 4.15 on presentation, improved to 3.96 today. CT abdomen/pelvis (and inexplicably also a f/u renal u/s) shows no obstruction. - continue gentle fluids  # Acute cystitis? Urinalysis abnormal, does CIC at home. Per  admitting MD history ESBL uti - f/u culture - continue penem  # Diarrhea Per family report, none here - stool studies if more diarrhea here  # Neurogenic bladder Hx transverse myelitis. CICs at home - Foley for now  # History seizure - eeg as above - resume home keppra  and gabapentin   # Hypertensive urgency Bp in 200s on arrival, improved to 180s today - continue home meds (amlodipine , clonidine ) - hydral prn  # Chronic pain - re-order home meds after med rec but will likely reduce dose of opioids given encephalopathy  # T2DM Appears to be diet controlled, sugars are appropriate - monitor  # Obesity noted  DVT prophylaxis: heparin  Code Status: full Family Communication: daughter updated telephonically 8/2  Level of care: Telemetry Medical Status is: Inpatient Remains inpatient appropriate because: severity of illness    Consultants:  none  Procedures: none  Antimicrobials:  meropenem     Subjective: Confused, says hurts all over  Objective: Vitals:   08/06/23 1806 08/06/23 1931 08/07/23 0318 08/07/23 0742  BP: (!) 200/83 (!) 171/78 (!) 193/79 (!) 184/91  Pulse:  90 82 83  Resp:  13 18 18   Temp:  98.4 F (36.9 C) 98.4 F (36.9 C) 98.1 F (36.7 C)  TempSrc:  Oral Axillary   SpO2:  98% 100% 97%  Weight:      Height:  5' 8 (1.727 m)      Intake/Output Summary (Last 24 hours) at 08/07/2023 1302 Last data filed at 08/07/2023 1128 Gross per 24 hour  Intake 240  ml  Output 2050 ml  Net -1810 ml   Filed Weights   08/06/23 1126  Weight: 99.8 kg    Examination:  General exam: Appears chronically ill and anxious Respiratory system: Clear to auscultation. Respiratory effort normal. Cardiovascular system: S1 & S2 heard, RRR. N  Gastrointestinal system: Abdomen is obese, soft and nontender.   Central nervous system: moving all 4 Extremities: Symmetric 5 x 5 power. Skin: No rashes, lesions or ulcers Psychiatry: appears anxious    Data  Reviewed: I have personally reviewed following labs and imaging studies  CBC: Recent Labs  Lab 08/06/23 1147 08/07/23 0347  WBC 11.3* 9.1  NEUTROABS 9.6*  --   HGB 12.8* 11.1*  HCT 38.4* 34.1*  MCV 88.1 89.0  PLT 284 235   Basic Metabolic Panel: Recent Labs  Lab 08/06/23 1147 08/07/23 0347  NA 140 143  K 4.5 4.2  CL 104 108  CO2 19* 20*  GLUCOSE 124* 117*  BUN 56* 61*  CREATININE 4.15* 3.96*  CALCIUM  10.4* 9.2   GFR: Estimated Creatinine Clearance: 21.6 mL/min (A) (by C-G formula based on SCr of 3.96 mg/dL (H)). Liver Function Tests: Recent Labs  Lab 08/06/23 1147 08/07/23 0347  AST 17 17  ALT 11 11  ALKPHOS 103 82  BILITOT 1.0 0.6  PROT 7.7 6.7  ALBUMIN  3.9 3.5   No results for input(s): LIPASE, AMYLASE in the last 168 hours. No results for input(s): AMMONIA in the last 168 hours. Coagulation Profile: Recent Labs  Lab 08/06/23 1147  INR 1.0   Cardiac Enzymes: Recent Labs  Lab 08/06/23 1147  CKTOTAL 356   BNP (last 3 results) No results for input(s): PROBNP in the last 8760 hours. HbA1C: No results for input(s): HGBA1C in the last 72 hours. CBG: Recent Labs  Lab 08/06/23 1807 08/07/23 0740 08/07/23 1200  GLUCAP 108* 104* 130*   Lipid Profile: No results for input(s): CHOL, HDL, LDLCALC, TRIG, CHOLHDL, LDLDIRECT in the last 72 hours. Thyroid Function Tests: No results for input(s): TSH, T4TOTAL, FREET4, T3FREE, THYROIDAB in the last 72 hours. Anemia Panel: No results for input(s): VITAMINB12, FOLATE, FERRITIN, TIBC, IRON, RETICCTPCT in the last 72 hours. Urine analysis:    Component Value Date/Time   COLORURINE YELLOW (A) 08/06/2023 1405   APPEARANCEUR CLOUDY (A) 08/06/2023 1405   LABSPEC 1.013 08/06/2023 1405   PHURINE 8.0 08/06/2023 1405   GLUCOSEU NEGATIVE 08/06/2023 1405   HGBUR NEGATIVE 08/06/2023 1405   BILIRUBINUR NEGATIVE 08/06/2023 1405   KETONESUR 5 (A) 08/06/2023 1405   PROTEINUR  >=300 (A) 08/06/2023 1405   NITRITE NEGATIVE 08/06/2023 1405   LEUKOCYTESUR SMALL (A) 08/06/2023 1405   Sepsis Labs: @LABRCNTIP (procalcitonin:4,lacticidven:4)  ) Recent Results (from the past 240 hours)  Blood culture (routine single)     Status: None (Preliminary result)   Collection Time: 08/06/23 11:47 AM   Specimen: BLOOD LEFT FOREARM  Result Value Ref Range Status   Specimen Description   Final    BLOOD LEFT FOREARM Performed at [facility], [address], [place], [place] [address]    Special Requests   Final    BOTTLES DRAWN AEROBIC AND ANAEROBIC Blood Culture results may not be optimal due to an inadequate volume of blood received in culture bottles   Culture   Final    NO GROWTH < 24 HOURS Performed at [facility], [address] [place], [place] [address]    Report Status PENDING  Incomplete  Resp panel by RT-PCR (RSV,  Flu A&B, Covid) Anterior Nasal Swab     Status: None   Collection Time: 08/06/23 11:47 AM   Specimen: Anterior Nasal Swab  Result Value Ref Range Status   SARS Coronavirus 2 by RT PCR NEGATIVE NEGATIVE Final    Comment: (NOTE) SARS-CoV-2 target nucleic acids are NOT DETECTED.  The SARS-CoV-2 RNA is generally detectable in upper respiratory specimens during the acute phase of infection. The lowest concentration of SARS-CoV-2 viral copies this assay can detect is 138 copies/mL. A negative result does not preclude SARS-Cov-2 infection and should not be used as the sole basis for treatment or other patient management decisions. A negative result may occur with  improper specimen collection/handling, submission of specimen other than nasopharyngeal swab, presence of viral mutation(s) within the areas targeted by this assay, and inadequate number of viral copies(<138 copies/mL). A negative result must be combined with clinical observations, patient history, and epidemiological information. The expected result is Negative.  Fact Sheet  for Patients:  BloggerCourse.com  Fact Sheet for Healthcare Providers:  SeriousBroker.it  This test is no t yet approved or cleared by the [place]  FDA and  has been authorized for detection and/or diagnosis of SARS-CoV-2 by FDA under an Emergency Use Authorization (EUA). This EUA will remain  in effect (meaning this test can be used) for the duration of the COVID-19 declaration under Section 564(b)(1) of the Act, 21 U.S.C.section 360bbb-3(b)(1), unless the authorization is terminated  or revoked sooner.       Influenza A by PCR NEGATIVE NEGATIVE Final   Influenza B by PCR NEGATIVE NEGATIVE Final    Comment: (NOTE) The Xpert Xpress SARS-CoV-2/FLU/RSV plus assay is intended as an aid in the diagnosis of influenza from Nasopharyngeal swab specimens and should not be used as a sole basis for treatment. Nasal washings and aspirates are unacceptable for Xpert Xpress SARS-CoV-2/FLU/RSV testing.  Fact Sheet for Patients: BloggerCourse.com  Fact Sheet for Healthcare Providers: SeriousBroker.it  This test is not yet approved or cleared by the [place]  FDA and has been authorized for detection and/or diagnosis of SARS-CoV-2 by FDA under an Emergency Use Authorization (EUA). This EUA will remain in effect (meaning this test can be used) for the duration of the COVID-19 declaration under Section 564(b)(1) of the Act, 21 U.S.C. section 360bbb-3(b)(1), unless the authorization is terminated or revoked.     Resp Syncytial Virus by PCR NEGATIVE NEGATIVE Final    Comment: (NOTE) Fact Sheet for Patients: BloggerCourse.com  Fact Sheet for Healthcare Providers: SeriousBroker.it  This test is not yet approved or cleared by the [place]  FDA and has been authorized for detection and/or diagnosis of SARS-CoV-2 by FDA under an  Emergency Use Authorization (EUA). This EUA will remain in effect (meaning this test can be used) for the duration of the COVID-19 declaration under Section 564(b)(1) of the Act, 21 U.S.C. section 360bbb-3(b)(1), unless the authorization is terminated or revoked.  Performed at [facility], [address] [place], [place] [address]   Culture, blood (single)     Status: None (Preliminary result)   Collection Time: 08/06/23  6:40 PM   Specimen: BLOOD  Result Value Ref Range Status   Specimen Description BLOOD BLOOD RIGHT ARM  Final   Special Requests   Final    AEROBIC BOTTLE ONLY Blood Culture results may not be optimal due to an inadequate volume of blood received in culture bottles   Culture   Final    NO GROWTH < 24 HOURS Performed at  [facility], [address] [place], [place] [address]    Report Status PENDING  Incomplete         Radiology Studies: US  RENAL Result Date: 08/06/2023 CLINICAL DATA:  Urinary tract infection EXAM: RENAL / URINARY TRACT ULTRASOUND COMPLETE COMPARISON:  CT 08/06/2023 FINDINGS: Right Kidney: Renal measurements: 11.8 x 5.8 x 6.6 cm = volume: 239 mL. Echogenicity within normal limits. No mass or hydronephrosis visualized. Left Kidney: Renal measurements: 11.4 x 6 x 6.4 cm = volume: 229 mL. Echogenicity within normal limits. No mass or hydronephrosis visualized. Bladder: Bladder is partially decompressed with a Foley catheter. Bladder wall thickening may be due to under distention or cystitis. Correlate with urinalysis. Suggestion of a diverticulum along the dome of the bladder but this could just be due to under distention. Other: None. IMPRESSION: 1. Normal stone appearance of the kidneys.  No hydronephrosis. 2. Foley catheter decompresses the bladder. Bladder wall thickening may be due to under distention or cystitis. Appearance of small diverticulum at the dome of the bladder could be a true diverticulum or artifact from under  distention. Electronically Signed   By: Elsie Gravely M.D.   On: 08/06/2023 17:59   CT ABDOMEN PELVIS WO CONTRAST Result Date: 08/06/2023 CLINICAL DATA:  Acute kidney injury, evaluate for obstructive process EXAM: CT ABDOMEN AND PELVIS WITHOUT CONTRAST TECHNIQUE: Multidetector CT imaging of the abdomen and pelvis was performed following the standard protocol without IV contrast. RADIATION DOSE REDUCTION: This exam was performed according to the departmental dose-optimization program which includes automated exposure control, adjustment of the mA and/or kV according to patient size and/or use of iterative reconstruction technique. COMPARISON:  July 13, 2020, Jun 04, 2020 FINDINGS: Of note, the lack of intravenous contrast limits evaluation of the solid organ parenchyma and vascularity. The study is degraded by patient's arm positioning. Lower chest: No focal airspace consolidation or pleural effusion. Mild cardiomegaly. Hepatobiliary: No mass.No radiopaque stones or wall thickening of the gallbladder. No intrahepatic or extrahepatic biliary ductal dilation. Pancreas: No mass or main ductal dilation. No peripancreatic inflammation or fluid collection. Spleen: Normal size. No mass. Adrenals/Urinary Tract: No adrenal masses. Small 1 cm right lower pole renal cyst. No hydronephrosis or nephrolithiasis. The urinary bladder is partially obscured by metallic streak artifact from orthopedic hardware. Urinary catheter partially visualized. Stomach/Bowel: The stomach is decompressed without focal abnormality. No small bowel wall thickening or inflammation. No small bowel obstruction.Normal appendix. Descending and sigmoid colonic diverticulosis. No changes of acute diverticulitis. Vascular/Lymphatic: No aortic aneurysm. Diffuse aortoiliac atherosclerosis. No intraabdominal or pelvic lymphadenopathy. Reproductive: The prostate was not well visualized or evaluated due to metallic streak artifact from both hip arthroplasties.  No free pelvic fluid. Other: No pneumoperitoneum, ascites, or mesenteric inflammation. Musculoskeletal: No acute fracture or destructive lesion. Both hip arthroplasties are anatomically aligned without dislocation. Large Schmorl's node along the superior endplate of T12 with adjacent interosseous hemangioma. IMPRESSION: 1. No acute intra-abdominal or pelvic abnormality. 2. Descending and sigmoid colonic diverticulosis. No changes of acute diverticulitis. 3. Partially visualized urinary catheter. The retention balloon and distal ureters bilaterally were not well visualized or evaluated due to metallic streak artifact from both hip arthroplasties. Otherwise, no visualized hydroureteronephrosis or nephrolithiasis within either kidney. Aortic Atherosclerosis (ICD10-I70.0). Electronically Signed   By: Rogelia Myers M.D.   On: 08/06/2023 16:59   CT HEAD WO CONTRAST Result Date: 08/06/2023 CLINICAL DATA:  63 year old male with altered mental status, found confused at home. EXAM: CT HEAD WITHOUT CONTRAST TECHNIQUE: Contiguous axial images  were obtained from the base of the skull through the vertex without intravenous contrast. RADIATION DOSE REDUCTION: This exam was performed according to the departmental dose-optimization program which includes automated exposure control, adjustment of the mA and/or kV according to patient size and/or use of iterative reconstruction technique. COMPARISON:  Brain MRI, Head CT 10/14/2020. FINDINGS: Brain: Stable cerebral volume since 2022. Normalized gray-white differentiation since that time when posterior reversible encephalopathy syndrome (PRES) was suspected by CT and MRI. No midline shift, ventriculomegaly, mass effect, evidence of mass lesion, intracranial hemorrhage or evidence of cortically based acute infarction. No cortical encephalomalacia identified. Vascular: Calcified atherosclerosis at the skull base. No suspicious intracranial vascular hyperdensity. Skull: Mild motion  artifact. No acute osseous abnormality identified. Sinuses/Orbits: Visualized paranasal sinuses and mastoids are clear. Other: No acute orbit or scalp soft tissue finding. IMPRESSION: 1. No acute intracranial abnormality. 2. Normalized gray-white differentiation compared to 2022 CT/MRI when PRES was suspected on imaging. Electronically Signed   By: VEAR Hurst M.D.   On: 08/06/2023 12:25   DG Chest Port 1 View Result Date: 08/06/2023 CLINICAL DATA:  Questionable sepsis - evaluate for abnormality. Altered mental status. EXAM: PORTABLE CHEST 1 VIEW COMPARISON:  10/14/2020. FINDINGS: Low lung volume. Bilateral lung fields are clear. Bilateral costophrenic angles are clear. Stable cardio-mediastinal silhouette. No acute osseous abnormalities. Right reverse shoulder arthroplasty noted. The soft tissues are within normal limits. IMPRESSION: No active disease. Electronically Signed   By: Ree Molt M.D.   On: 08/06/2023 12:07        Scheduled Meds:  amLODipine   10 mg Oral Daily   aspirin   81 mg Oral Daily   atorvastatin   40 mg Oral q1800   Chlorhexidine  Gluconate Cloth  6 each Topical Daily   cloNIDine   0.3 mg Transdermal Weekly   heparin   5,000 Units Subcutaneous Q8H   insulin  aspart  0-6 Units Subcutaneous TID WC   labetalol   200 mg Oral TID   prazosin   4 mg Oral QHS   Continuous Infusions:  sodium chloride  Stopped (08/06/23 1754)   meropenem  (MERREM ) IV 500 mg (08/07/23 0939)     LOS: 1 day     Devaughn KATHEE Ban, MD [facility]   If 7PM-7AM, please contact night-coverage [email or web address] Password [email or web address] 08/07/2023, 1:02 PM

## 2023-08-08 ENCOUNTER — Inpatient Hospital Stay

## 2023-08-08 LAB — BASIC METABOLIC PANEL WITH GFR
Anion gap: 14 (ref 5–15)
BUN: 59 mg/dL — ABNORMAL HIGH (ref 8–23)
CO2: 18 mmol/L — ABNORMAL LOW (ref 22–32)
Calcium: 8.6 mg/dL — ABNORMAL LOW (ref 8.9–10.3)
Chloride: 109 mmol/L (ref 98–111)
Creatinine, Ser: 3.73 mg/dL — ABNORMAL HIGH (ref 0.61–1.24)
GFR, Estimated: 17 mL/min — ABNORMAL LOW (ref >60)
Glucose, Bld: 116 mg/dL — ABNORMAL HIGH (ref 70–99)
Potassium: 4.3 mmol/L (ref 3.5–5.1)
Sodium: 141 mmol/L (ref 135–145)

## 2023-08-08 LAB — CULTURE, BLOOD (SINGLE)

## 2023-08-08 LAB — CBC
HCT: 32.5 % — ABNORMAL LOW (ref 39.0–52.0)
Hemoglobin: 10.8 g/dL — ABNORMAL LOW (ref 13.0–17.0)
MCH: 29.6 pg (ref 26.0–34.0)
MCHC: 33.2 g/dL (ref 30.0–36.0)
MCV: 89 fL (ref 80.0–100.0)
Platelets: 211 K/uL (ref 150–400)
RBC: 3.65 MIL/uL — ABNORMAL LOW (ref 4.22–5.81)
RDW: 12.9 % (ref 11.5–15.5)
WBC: 6.1 K/uL (ref 4.0–10.5)
nRBC: 0 % (ref 0.0–0.2)

## 2023-08-08 MED ORDER — HYDRALAZINE HCL 50 MG PO TABS
50.0000 mg | ORAL_TABLET | Freq: Three times a day (TID) | ORAL | Status: DC
  Administered 2023-08-08 – 2023-08-09 (×5): 50 mg via ORAL
  Filled 2023-08-08 (×5): qty 1

## 2023-08-08 MED ORDER — GADOBUTROL 1 MMOL/ML IV SOLN: 9.0000 mL | Freq: Once | INTRAVENOUS | Status: DC | PRN

## 2023-08-08 MED ORDER — BUPRENORPHINE HCL 600 MCG BU FILM: 600.0000 ug | ORAL_FILM | Freq: Two times a day (BID) | BUCCAL | Status: DC

## 2023-08-08 MED ORDER — OXYCODONE-ACETAMINOPHEN 5-325 MG PO TABS
1.0000 | ORAL_TABLET | Freq: Two times a day (BID) | ORAL | Status: DC | PRN
  Administered 2023-08-08 – 2023-08-09 (×3): 1 via ORAL
  Filled 2023-08-08 (×3): qty 1

## 2023-08-08 MED ORDER — SODIUM BICARBONATE 650 MG PO TABS
650.0000 mg | ORAL_TABLET | Freq: Two times a day (BID) | ORAL | Status: DC
  Administered 2023-08-08 – 2023-08-09 (×3): 650 mg via ORAL
  Filled 2023-08-08 (×3): qty 1

## 2023-08-08 NOTE — Progress Notes (Signed)
 PROGRESS NOTE    [employer]  FMW:[id number] DOB: August 09, 1960 DOA: 08/06/2023 PCP: System, Provider Not In  [facility]    Brief Narrative:   Wyatt Schubring Mickey. is a 63 y.o. male with medical history significant of Anxiety, DMII , HTN,PTSD, hx of transverse myelitis, CVA, PRES, Seizure d/o , CKDIIla-b,  hx of urinary retention in setting of hypospadias, on CIC, who presents to ED after family noted patient having acute mental status change as well as n/v/d. Per family patient 2 days ago started running a fever and having intermittent n/v. Today patient became acutely agitated and spacing around the room and yelling.. IN the field EMS notes patient is ambulatory with stable vitals but with episodes of n/v/d per triage note.  Patient currently notes no sob/n/v/ does have some mild abdominal pain. Somnolent not able to give history. But noted to be calm and follows commands well.    Assessment & Plan:   Principal Problem:   AKI (acute kidney injury) (HCC) Active Problems:   Chronic, continuous use of opioids   Type 2 diabetes mellitus with hyperlipidemia (HCC)   Stroke (cerebrum) (HCC)   Seizure (HCC)  # Acute encephalopathy Etiology unclear. Was admitted with similar presentation in 2022 and diagnosed with PRES. May have UTI. Kidney function is a bit off from his baseline. No significant electrolyte abnormality. Recent diarrheal illness but no report of vomiting or diarrhea here. Is on several meds that can contribute to encephalopathy but daughter denies med changes. Does have hx seizure. UDS no drugs of abuse - MRI brain w/ contrast - monitor cultures - EEG  # AKI on ckd 4 # Metabolic acidosis Baseline cr appears to be in the 3s as of late per [place] records. Here 4.15 on presentation, improved to 3.73 today. CT abdomen/pelvis (and inexplicably also a f/u renal u/s) shows no obstruction. - monitor off fluids - start sodium bicarb  # Debility PT advising  home health  # Acute cystitis? Urinalysis abnormal, does CIC at home. Per admitting MD history ESBL uti. Enterococcus growing in culture - f/u sensitivities - continue penem  # Bacteremia Staph species in 1/4 cultures, not aureus. Likely contaminant - f/u speciation  # Nausea No vomiting, tolerating diet, ct abdomen/pelvis nothing acute - monitor  # Chronic pain Med rec finally complete - resume home buprenorphine , oxy prn, duloxetine   # Diarrhea Per family report, none here - stool studies if more diarrhea here  # Neurogenic bladder Hx transverse myelitis. CICs at home - Foley for now  # History seizure - eeg as above - home keppra   # Hypertensive urgency Bp in 200s on arrival, improved to 180s today - continue amlodipine , clonidine , labetalol  - add scheduled hydralazine   # T2DM Appears to be diet controlled, sugars are appropriate - monitor  # Obesity noted  DVT prophylaxis: heparin  Code Status: full Family Communication: daughter updated telephonically 8/2, no answer when telephoned today. No answer when brother telephoned today, either.  Level of care: Telemetry Medical Status is: Inpatient Remains inpatient appropriate because: severity of illness    Consultants:  none  Procedures: none  Antimicrobials:  meropenem     Subjective: Feeling somewhat better today. Remains nauseous. Tolerating diet.  Objective: Vitals:   08/07/23 1701 08/07/23 1935 08/08/23 0514 08/08/23 0756  BP: (!) 160/76 (!) 163/77 (!) 171/72 (!) 184/80  Pulse: 80 83 81 83  Resp:  18 15 16   Temp:  98.6 F (37 C) 98.1 F (36.7 C) 98.5 F (36.9  C)  TempSrc:   Axillary Oral  SpO2:  100% 100% 100%  Weight:      Height:        Intake/Output Summary (Last 24 hours) at 08/08/2023 1354 Last data filed at 08/08/2023 0900 Gross per 24 hour  Intake 240 ml  Output 1600 ml  Net -1360 ml   Filed Weights   08/06/23 1126  Weight: 99.8 kg    Examination:  General exam:  Appears chronically ill and anxious Respiratory system: Clear to auscultation. Respiratory effort normal. Cardiovascular system: S1 & S2 heard, RRR. N  Gastrointestinal system: Abdomen is obese, soft and nontender.   Central nervous system: moving all 4 Extremities: Symmetric 5 x 5 power. Skin: No rashes, lesions or ulcers Psychiatry: appears anxious    Data Reviewed: I have personally reviewed following labs and imaging studies  CBC: Recent Labs  Lab 08/06/23 1147 08/07/23 0347 08/08/23 0542  WBC 11.3* 9.1 6.1  NEUTROABS 9.6*  --   --   HGB 12.8* 11.1* 10.8*  HCT 38.4* 34.1* 32.5*  MCV 88.1 89.0 89.0  PLT 284 235 211   Basic Metabolic Panel: Recent Labs  Lab 08/06/23 1147 08/07/23 0347 08/08/23 0542  NA 140 143 141  K 4.5 4.2 4.3  CL 104 108 109  CO2 19* 20* 18*  GLUCOSE 124* 117* 116*  BUN 56* 61* 59*  CREATININE 4.15* 3.96* 3.73*  CALCIUM  10.4* 9.2 8.6*   GFR: Estimated Creatinine Clearance: 22.9 mL/min (A) (by C-G formula based on SCr of 3.73 mg/dL (H)). Liver Function Tests: Recent Labs  Lab 08/06/23 1147 08/07/23 0347  AST 17 17  ALT 11 11  ALKPHOS 103 82  BILITOT 1.0 0.6  PROT 7.7 6.7  ALBUMIN  3.9 3.5   No results for input(s): LIPASE, AMYLASE in the last 168 hours. No results for input(s): AMMONIA in the last 168 hours. Coagulation Profile: Recent Labs  Lab 08/06/23 1147  INR 1.0   Cardiac Enzymes: Recent Labs  Lab 08/06/23 1147  CKTOTAL 356   BNP (last 3 results) No results for input(s): PROBNP in the last 8760 hours. HbA1C: No results for input(s): HGBA1C in the last 72 hours. CBG: Recent Labs  Lab 08/06/23 1807 08/07/23 0740 08/07/23 1200 08/07/23 1944  GLUCAP 108* 104* 130* 133*   Lipid Profile: No results for input(s): CHOL, HDL, LDLCALC, TRIG, CHOLHDL, LDLDIRECT in the last 72 hours. Thyroid Function Tests: No results for input(s): TSH, T4TOTAL, FREET4, T3FREE, THYROIDAB in the last 72  hours. Anemia Panel: No results for input(s): VITAMINB12, FOLATE, FERRITIN, TIBC, IRON, RETICCTPCT in the last 72 hours. Urine analysis:    Component Value Date/Time   COLORURINE YELLOW (A) 08/06/2023 1405   APPEARANCEUR CLOUDY (A) 08/06/2023 1405   LABSPEC 1.013 08/06/2023 1405   PHURINE 8.0 08/06/2023 1405   GLUCOSEU NEGATIVE 08/06/2023 1405   HGBUR NEGATIVE 08/06/2023 1405   BILIRUBINUR NEGATIVE 08/06/2023 1405   KETONESUR 5 (A) 08/06/2023 1405   PROTEINUR >=300 (A) 08/06/2023 1405   NITRITE NEGATIVE 08/06/2023 1405   LEUKOCYTESUR SMALL (A) 08/06/2023 1405   Sepsis Labs: @LABRCNTIP (procalcitonin:4,lacticidven:4)  ) Recent Results (from the past 240 hours)  Blood culture (routine single)     Status: None (Preliminary result)   Collection Time: 08/06/23 11:47 AM   Specimen: BLOOD LEFT FOREARM  Result Value Ref Range Status   Specimen Description   Final    BLOOD LEFT FOREARM Performed at [facility], [address], [place], [place] [address]  Special Requests   Final    BOTTLES DRAWN AEROBIC AND ANAEROBIC Blood Culture results may not be optimal due to an inadequate volume of blood received in culture bottles Performed at [facility], [address] [place], [place] [address]    Culture  Setup Time   Final    GRAM POSITIVE COCCI AEROBIC BOTTLE ONLY CRITICAL RESULT CALLED TO, READ BACK BY AND VERIFIED WITH: PHARMD TREY GREENWOOD [id number] BY JINNY GUTTING, MT    Culture   Final    GRAM POSITIVE COCCI IDENTIFICATION TO FOLLOW Performed at [facility], [address], [place], [place] [address]    Report Status PENDING  Incomplete  Resp panel by RT-PCR (RSV, Flu A&B, Covid) Anterior Nasal Swab     Status: None   Collection Time: 08/06/23 11:47 AM   Specimen: Anterior Nasal Swab  Result Value Ref Range Status   SARS Coronavirus 2 by RT PCR NEGATIVE NEGATIVE Final    Comment: (NOTE) SARS-CoV-2 target nucleic acids are NOT  DETECTED.  The SARS-CoV-2 RNA is generally detectable in upper respiratory specimens during the acute phase of infection. The lowest concentration of SARS-CoV-2 viral copies this assay can detect is 138 copies/mL. A negative result does not preclude SARS-Cov-2 infection and should not be used as the sole basis for treatment or other patient management decisions. A negative result may occur with  improper specimen collection/handling, submission of specimen other than nasopharyngeal swab, presence of viral mutation(s) within the areas targeted by this assay, and inadequate number of viral copies(<138 copies/mL). A negative result must be combined with clinical observations, patient history, and epidemiological information. The expected result is Negative.  Fact Sheet for Patients:  BloggerCourse.com  Fact Sheet for Healthcare Providers:  SeriousBroker.it  This test is no t yet approved or cleared by the [place]  FDA and  has been authorized for detection and/or diagnosis of SARS-CoV-2 by FDA under an Emergency Use Authorization (EUA). This EUA will remain  in effect (meaning this test can be used) for the duration of the COVID-19 declaration under Section 564(b)(1) of the Act, 21 U.S.C.section 360bbb-3(b)(1), unless the authorization is terminated  or revoked sooner.       Influenza A by PCR NEGATIVE NEGATIVE Final   Influenza B by PCR NEGATIVE NEGATIVE Final    Comment: (NOTE) The Xpert Xpress SARS-CoV-2/FLU/RSV plus assay is intended as an aid in the diagnosis of influenza from Nasopharyngeal swab specimens and should not be used as a sole basis for treatment. Nasal washings and aspirates are unacceptable for Xpert Xpress SARS-CoV-2/FLU/RSV testing.  Fact Sheet for Patients: BloggerCourse.com  Fact Sheet for Healthcare Providers: SeriousBroker.it  This test is not yet  approved or cleared by the [place]  FDA and has been authorized for detection and/or diagnosis of SARS-CoV-2 by FDA under an Emergency Use Authorization (EUA). This EUA will remain in effect (meaning this test can be used) for the duration of the COVID-19 declaration under Section 564(b)(1) of the Act, 21 U.S.C. section 360bbb-3(b)(1), unless the authorization is terminated or revoked.     Resp Syncytial Virus by PCR NEGATIVE NEGATIVE Final    Comment: (NOTE) Fact Sheet for Patients: BloggerCourse.com  Fact Sheet for Healthcare Providers: SeriousBroker.it  This test is not yet approved or cleared by the [place]  FDA and has been authorized for detection and/or diagnosis of SARS-CoV-2 by FDA under an Emergency Use Authorization (EUA). This EUA will remain in effect (meaning this test can be used) for  the duration of the COVID-19 declaration under Section 564(b)(1) of the Act, 21 U.S.C. section 360bbb-3(b)(1), unless the authorization is terminated or revoked.  Performed at [facility], [address] [place], [place] [address]   Blood Culture ID Panel (Reflexed)     Status: Abnormal   Collection Time: 08/06/23 11:47 AM  Result Value Ref Range Status   Enterococcus faecalis NOT DETECTED NOT DETECTED Final   Enterococcus Faecium NOT DETECTED NOT DETECTED Final   Listeria monocytogenes NOT DETECTED NOT DETECTED Final   Staphylococcus species DETECTED (A) NOT DETECTED Final    Comment: CRITICAL RESULT CALLED TO, READ BACK BY AND VERIFIED WITH: PHARMD TREY GREENWOOD [id number] [id number] BY J RAZZAK, MT    Staphylococcus aureus (BCID) NOT DETECTED NOT DETECTED Final   Staphylococcus epidermidis NOT DETECTED NOT DETECTED Final   Staphylococcus lugdunensis NOT DETECTED NOT DETECTED Final   Streptococcus species NOT DETECTED NOT DETECTED Final   Streptococcus agalactiae NOT DETECTED NOT DETECTED Final   Streptococcus  pneumoniae NOT DETECTED NOT DETECTED Final   Streptococcus pyogenes NOT DETECTED NOT DETECTED Final   A.calcoaceticus-baumannii NOT DETECTED NOT DETECTED Final   Bacteroides fragilis NOT DETECTED NOT DETECTED Final   Enterobacterales NOT DETECTED NOT DETECTED Final   Enterobacter cloacae complex NOT DETECTED NOT DETECTED Final   Escherichia coli NOT DETECTED NOT DETECTED Final   Klebsiella aerogenes NOT DETECTED NOT DETECTED Final   Klebsiella oxytoca NOT DETECTED NOT DETECTED Final   Klebsiella pneumoniae NOT DETECTED NOT DETECTED Final   Proteus species NOT DETECTED NOT DETECTED Final   Salmonella species NOT DETECTED NOT DETECTED Final   Serratia marcescens NOT DETECTED NOT DETECTED Final   Haemophilus influenzae NOT DETECTED NOT DETECTED Final   Neisseria meningitidis NOT DETECTED NOT DETECTED Final   Pseudomonas aeruginosa NOT DETECTED NOT DETECTED Final   Stenotrophomonas maltophilia NOT DETECTED NOT DETECTED Final   Candida albicans NOT DETECTED NOT DETECTED Final   Candida auris NOT DETECTED NOT DETECTED Final   Candida glabrata NOT DETECTED NOT DETECTED Final   Candida krusei NOT DETECTED NOT DETECTED Final   Candida parapsilosis NOT DETECTED NOT DETECTED Final   Candida tropicalis NOT DETECTED NOT DETECTED Final   Cryptococcus neoformans/gattii NOT DETECTED NOT DETECTED Final    Comment: Performed at [facility], [address] [place], [place] [address]  Urine Culture     Status: Abnormal (Preliminary result)   Collection Time: 08/06/23  2:05 PM   Specimen: Urine, Random  Result Value Ref Range Status   Specimen Description   Final    URINE, RANDOM Performed at [facility], [address], [place], [place] [address]    Special Requests   Final    NONE Reflexed from [id number] Performed at [facility], [address] [place], [place] [address]    Culture (A)  Final    >=100,000 COLONIES/mL ENTEROCOCCUS FAECALIS SUSCEPTIBILITIES TO  FOLLOW CULTURE REINCUBATED FOR BETTER GROWTH Performed at [facility], [address], [place], [place] [address]    Report Status PENDING  Incomplete  Culture, blood (single)     Status: None (Preliminary result)   Collection Time: 08/06/23  6:40 PM   Specimen: BLOOD  Result Value Ref Range Status   Specimen Description BLOOD BLOOD RIGHT ARM  Final   Special Requests   Final    AEROBIC BOTTLE ONLY Blood Culture results may not be optimal due to an inadequate volume of blood received in culture bottles   Culture  Final    NO GROWTH 2 DAYS Performed at [facility], [address] [place] [place], [place] [address]    Report Status PENDING  Incomplete         Radiology Studies: US  RENAL Result Date: 08/06/2023 CLINICAL DATA:  Urinary tract infection EXAM: RENAL / URINARY TRACT ULTRASOUND COMPLETE COMPARISON:  CT 08/06/2023 FINDINGS: Right Kidney: Renal measurements: 11.8 x 5.8 x 6.6 cm = volume: 239 mL. Echogenicity within normal limits. No mass or hydronephrosis visualized. Left Kidney: Renal measurements: 11.4 x 6 x 6.4 cm = volume: 229 mL. Echogenicity within normal limits. No mass or hydronephrosis visualized. Bladder: Bladder is partially decompressed with a Foley catheter. Bladder wall thickening may be due to under distention or cystitis. Correlate with urinalysis. Suggestion of a diverticulum along the dome of the bladder but this could just be due to under distention. Other: None. IMPRESSION: 1. Normal stone appearance of the kidneys.  No hydronephrosis. 2. Foley catheter decompresses the bladder. Bladder wall thickening may be due to under distention or cystitis. Appearance of small diverticulum at the dome of the bladder could be a true diverticulum or artifact from under distention. Electronically Signed   By: Elsie Gravely M.D.   On: 08/06/2023 17:59   CT ABDOMEN PELVIS WO CONTRAST Result Date: 08/06/2023 CLINICAL DATA:  Acute kidney injury, evaluate for  obstructive process EXAM: CT ABDOMEN AND PELVIS WITHOUT CONTRAST TECHNIQUE: Multidetector CT imaging of the abdomen and pelvis was performed following the standard protocol without IV contrast. RADIATION DOSE REDUCTION: This exam was performed according to the departmental dose-optimization program which includes automated exposure control, adjustment of the mA and/or kV according to patient size and/or use of iterative reconstruction technique. COMPARISON:  July 13, 2020, Jun 04, 2020 FINDINGS: Of note, the lack of intravenous contrast limits evaluation of the solid organ parenchyma and vascularity. The study is degraded by patient's arm positioning. Lower chest: No focal airspace consolidation or pleural effusion. Mild cardiomegaly. Hepatobiliary: No mass.No radiopaque stones or wall thickening of the gallbladder. No intrahepatic or extrahepatic biliary ductal dilation. Pancreas: No mass or main ductal dilation. No peripancreatic inflammation or fluid collection. Spleen: Normal size. No mass. Adrenals/Urinary Tract: No adrenal masses. Small 1 cm right lower pole renal cyst. No hydronephrosis or nephrolithiasis. The urinary bladder is partially obscured by metallic streak artifact from orthopedic hardware. Urinary catheter partially visualized. Stomach/Bowel: The stomach is decompressed without focal abnormality. No small bowel wall thickening or inflammation. No small bowel obstruction.Normal appendix. Descending and sigmoid colonic diverticulosis. No changes of acute diverticulitis. Vascular/Lymphatic: No aortic aneurysm. Diffuse aortoiliac atherosclerosis. No intraabdominal or pelvic lymphadenopathy. Reproductive: The prostate was not well visualized or evaluated due to metallic streak artifact from both hip arthroplasties. No free pelvic fluid. Other: No pneumoperitoneum, ascites, or mesenteric inflammation. Musculoskeletal: No acute fracture or destructive lesion. Both hip arthroplasties are anatomically  aligned without dislocation. Large Schmorl's node along the superior endplate of T12 with adjacent interosseous hemangioma. IMPRESSION: 1. No acute intra-abdominal or pelvic abnormality. 2. Descending and sigmoid colonic diverticulosis. No changes of acute diverticulitis. 3. Partially visualized urinary catheter. The retention balloon and distal ureters bilaterally were not well visualized or evaluated due to metallic streak artifact from both hip arthroplasties. Otherwise, no visualized hydroureteronephrosis or nephrolithiasis within either kidney. Aortic Atherosclerosis (ICD10-I70.0). Electronically Signed   By: Rogelia Myers M.D.   On: 08/06/2023 16:59        Scheduled Meds:  amLODipine   10 mg Oral Daily   atorvastatin   40  mg Oral q1800   Chlorhexidine  Gluconate Cloth  6 each Topical Daily   cloNIDine   0.3 mg Transdermal Weekly   DULoxetine   60 mg Oral Daily   gabapentin   100 mg Oral BID WC   heparin   5,000 Units Subcutaneous Q8H   hydrALAZINE   50 mg Oral Q8H   labetalol   200 mg Oral TID   levETIRAcetam   500 mg Oral BID   prazosin   4 mg Oral QHS   Continuous Infusions:  meropenem  (MERREM ) IV 500 mg (08/08/23 0914)     LOS: 2 days     Wyatt KATHEE Ban, MD [facility]   If 7PM-7AM, please contact night-coverage [email or web address] Password [email or web address] 08/08/2023, 1:54 PM

## 2023-08-08 NOTE — Progress Notes (Signed)
 Pt unable to lay flat onto back for MRI scan.  Patient was given meds at earlier attempt but was still uncooperative.  Night shift RN stated she and other tried rolling him onto back but keeps screaming help and rolls back onto back.  RN aware to call MRI if anything changes.

## 2023-08-08 NOTE — Evaluation (Signed)
 Physical Therapy Evaluation Patient Details Name: Wyatt Ball. MRN: [id number] DOB: 1960-02-20 Today's Date: 08/08/2023  History of Present Illness  Wyatt Ball. is a 63 y.o. male with medical history significant of Anxiety, DMII , HTN,PTSD, hx of transverse myelitis, CVA, PRES, Seizure d/o , CKDIIla-b,  hx of urinary retention in setting of hypospadias, on CIC, who presents to ED after family noted patient having acute mental status change as well as n/v/d.   Clinical Impression  Patient received in bed, sitter at bedside. Patient is pleasant. Flat affect. He reports feeling terrible and pain all over. Cannot elaborate. He is mod I with bed mobility, transfers with min A and is able to side step ~3 feet. Unable to tolerate more mobility at this time due to feeling poorly. Sitter reports patient has been nauseated all morning. He will continue to benefit from skilled PT to improve mobility and independence.          If plan is discharge home, recommend the following: A little help with bathing/dressing/bathroom;A lot of help with walking and/or transfers;Assist for transportation;Help with stairs or ramp for entrance   Can travel by private vehicle    Yes?    Equipment Recommendations None recommended by PT  Recommendations for Other Services       Functional Status Assessment Patient has had a recent decline in their functional status and demonstrates the ability to make significant improvements in function in a reasonable and predictable amount of time.     Precautions / Restrictions Precautions Precautions: Fall Recall of Precautions/Restrictions: Intact Restrictions Weight Bearing Restrictions Per Provider Order: No      Mobility  Bed Mobility Overal bed mobility: Modified Independent             General bed mobility comments: increased time and effort, but no physical assist provided    Transfers Overall transfer level: Needs  assistance Equipment used: Rolling walker (2 wheels) Transfers: Sit to/from Stand Sit to Stand: Min assist                Ambulation/Gait Ambulation/Gait assistance: Min assist Gait Distance (Feet): 3 Feet Assistive device: Rolling walker (2 wheels) Gait Pattern/deviations: Step-to pattern, Decreased step length - right, Decreased step length - left Gait velocity: decr     General Gait Details: able to side step ~ 3 feet with RW and cga. Patient limited by feeling terrible. Unable to elaborate  Stairs            Wheelchair Mobility     Tilt Bed    Modified Rankin (Stroke Patients Only)       Balance Overall balance assessment: Needs assistance Sitting-balance support: Feet supported Sitting balance-Leahy Scale: Good     Standing balance support: Bilateral upper extremity supported, During functional activity, Reliant on assistive device for balance Standing balance-Leahy Scale: Fair                               Pertinent Vitals/Pain Pain Assessment Pain Assessment: PAINAD Breathing: normal Negative Vocalization: none Facial Expression: sad, frightened, frown Body Language: relaxed Consolability: no need to console PAINAD Score: 1 Pain Intervention(s): Monitored during session    Home Living Family/patient expects to be discharged to:: Private residence Living Arrangements: Other relatives Available Help at Discharge: Family;Available 24 hours/day Type of Home: Mobile home Home Access: Stairs to enter Entrance Stairs-Rails: Right Entrance Stairs-Number of Steps: 10   Home Layout: One level Home  Equipment: Rollator (4 wheels)      Prior Function Prior Level of Function : Independent/Modified Independent;Driving             Mobility Comments: patient reports he lives with brother, uses rollator at baseline and drives ADLs Comments: reports he is independent     Extremity/Trunk Assessment   Upper Extremity  Assessment Upper Extremity Assessment: Overall WFL for tasks assessed    Lower Extremity Assessment Lower Extremity Assessment: Generalized weakness    Cervical / Trunk Assessment Cervical / Trunk Assessment: Normal  Communication   Communication Communication: No apparent difficulties    Cognition Arousal: Alert Behavior During Therapy: Flat affect   PT - Cognitive impairments: Orientation   Orientation impairments: Time, Situation                     Following commands: Intact       Cueing Cueing Techniques: Verbal cues     General Comments      Exercises     Assessment/Plan    PT Assessment Patient needs continued PT services  PT Problem List Decreased strength;Decreased activity tolerance;Decreased mobility;Pain       PT Treatment Interventions DME instruction;Gait training;Stair training;Functional mobility training;Therapeutic activities;Therapeutic exercise;Patient/family education    PT Goals (Current goals can be found in the Care Plan section)  Acute Rehab PT Goals Patient Stated Goal: return home with brother PT Goal Formulation: With patient Time For Goal Achievement: 08/22/23 Potential to Achieve Goals: Good    Frequency Min 2X/week     Co-evaluation               AM-PAC PT 6 Clicks Mobility  Outcome Measure Help needed turning from your back to your side while in a flat bed without using bedrails?: None Help needed moving from lying on your back to sitting on the side of a flat bed without using bedrails?: None Help needed moving to and from a bed to a chair (including a wheelchair)?: A Little Help needed standing up from a chair using your arms (e.g., wheelchair or bedside chair)?: A Little Help needed to walk in hospital room?: A Lot Help needed climbing 3-5 steps with a railing? : A Lot 6 Click Score: 18    End of Session   Activity Tolerance: Patient limited by fatigue;Patient limited by pain Patient left: in  bed;with call bell/phone within reach;with nursing/sitter in room Nurse Communication: Mobility status PT Visit Diagnosis: Muscle weakness (generalized) (M62.81);Difficulty in walking, not elsewhere classified (R26.2);Pain Pain - part of body:  (generalized unable to specify)    Time: 1311-1320 PT Time Calculation (min) (ACUTE ONLY): 9 min   Charges:   PT Evaluation $PT Eval Low Complexity: 1 Low   PT General Charges $$ ACUTE PT VISIT: 1 Visit        Wyatt Ball, PT, GCS 08/08/23,1:30 PM

## 2023-08-08 NOTE — Plan of Care (Signed)
  Problem: Education: Goal: Ability to describe self-care measures that may prevent or decrease complications (Diabetes Survival Skills Education) will improve Outcome: Progressing Goal: Individualized Educational Video(s) Outcome: Progressing   Problem: Coping: Goal: Ability to adjust to condition or change in health will improve Outcome: Progressing   Problem: Fluid Volume: Goal: Ability to maintain a balanced intake and output will improve Outcome: Progressing   Problem: Health Behavior/Discharge Planning: Goal: Ability to identify and utilize available resources and services will improve Outcome: Progressing Goal: Ability to manage health-related needs will improve Outcome: Progressing   Problem: Nutritional: Goal: Maintenance of adequate nutrition will improve Outcome: Progressing Goal: Progress toward achieving an optimal weight will improve Outcome: Progressing   Problem: Metabolic: Goal: Ability to maintain appropriate glucose levels will improve Outcome: Progressing   Problem: Skin Integrity: Goal: Risk for impaired skin integrity will decrease Outcome: Progressing   Problem: Tissue Perfusion: Goal: Adequacy of tissue perfusion will improve Outcome: Progressing   Problem: Education: Goal: Knowledge of General Education information will improve Description: Including pain rating scale, medication(s)/side effects and non-pharmacologic comfort measures Outcome: Progressing   Problem: Health Behavior/Discharge Planning: Goal: Ability to manage health-related needs will improve Outcome: Progressing   Problem: Clinical Measurements: Goal: Ability to maintain clinical measurements within normal limits will improve Outcome: Progressing Goal: Will remain free from infection Outcome: Progressing Goal: Diagnostic test results will improve Outcome: Progressing Goal: Respiratory complications will improve Outcome: Progressing Goal: Cardiovascular complication will  be avoided Outcome: Progressing   Problem: Activity: Goal: Risk for activity intolerance will decrease Outcome: Progressing   Problem: Nutrition: Goal: Adequate nutrition will be maintained Outcome: Progressing   Problem: Coping: Goal: Level of anxiety will decrease Outcome: Progressing

## 2023-08-09 ENCOUNTER — Inpatient Hospital Stay

## 2023-08-09 ENCOUNTER — Other Ambulatory Visit: Payer: Self-pay

## 2023-08-09 DIAGNOSIS — N179 Acute kidney failure, unspecified: Secondary | ICD-10-CM

## 2023-08-09 LAB — BASIC METABOLIC PANEL WITH GFR
Anion gap: 8 (ref 5–15)
BUN: 54 mg/dL — ABNORMAL HIGH (ref 8–23)
CO2: 19 mmol/L — ABNORMAL LOW (ref 22–32)
Calcium: 7.8 mg/dL — ABNORMAL LOW (ref 8.9–10.3)
Chloride: 108 mmol/L (ref 98–111)
Creatinine, Ser: 3.61 mg/dL — ABNORMAL HIGH (ref 0.61–1.24)
GFR, Estimated: 18 mL/min — ABNORMAL LOW (ref >60)
Glucose, Bld: 144 mg/dL — ABNORMAL HIGH (ref 70–99)
Potassium: 4.2 mmol/L (ref 3.5–5.1)
Sodium: 135 mmol/L (ref 135–145)

## 2023-08-09 LAB — HEMOGLOBIN A1C
Hgb A1c MFr Bld: 7 % — ABNORMAL HIGH (ref 4.8–5.6)
Mean Plasma Glucose: 154 mg/dL

## 2023-08-09 MED ORDER — PRAZOSIN HCL 1 MG PO CAPS
1.0000 mg | ORAL_CAPSULE | Freq: Two times a day (BID) | ORAL | Status: AC | PRN
Start: 2023-08-09 — End: ?

## 2023-08-09 MED ORDER — SODIUM BICARBONATE 650 MG PO TABS
650.0000 mg | ORAL_TABLET | Freq: Two times a day (BID) | ORAL | 1 refills | Status: AC
  Filled 2023-08-09: qty 60, 30d supply, fill #0

## 2023-08-09 MED ORDER — AMOXICILLIN 500 MG PO CAPS
500.0000 mg | ORAL_CAPSULE | Freq: Two times a day (BID) | ORAL | Status: DC
  Filled 2023-08-09: qty 1

## 2023-08-09 MED ORDER — AMOXICILLIN 500 MG PO CAPS
500.0000 mg | ORAL_CAPSULE | Freq: Two times a day (BID) | ORAL | 0 refills | Status: AC
  Filled 2023-08-09: qty 10, 5d supply, fill #0

## 2023-08-09 NOTE — Progress Notes (Signed)
 Eeg done

## 2023-08-09 NOTE — Progress Notes (Signed)
 Physical Therapy Treatment Patient Details Name: Wyatt Ball. MRN: [id number] DOB: 06-05-1960 Today's Date: 08/09/2023   History of Present Illness Wyatt Ball. is a 63 y.o. male with medical history significant of Anxiety, DMII , HTN,PTSD, hx of transverse myelitis, CVA, PRES, Seizure d/o , CKDIIla-b,  hx of urinary retention in setting of hypospadias, on CIC, who presents to ED after family noted patient having acute mental status change as well as n/v/d.    PT Comments  Pt seen for PT tx with pt agreeable. Pt is able to complete bed mobility with mod I, ambulate around unit with RW & CGA<>supervision. Pt reports he has a ramp at the front of his house, 10 steps to enter the back & he usually uses the front ramped entrance. Pt reports he's anticipating d/c home today. Pt is progressing well overall with mobility.    If plan is discharge home, recommend the following: A little help with bathing/dressing/bathroom;Assist for transportation;A little help with walking and/or transfers;Assistance with cooking/housework   Can travel by private vehicle        Equipment Recommendations  None recommended by PT    Recommendations for Other Services       Precautions / Restrictions Precautions Precautions: Fall Restrictions Weight Bearing Restrictions Per Provider Order: No     Mobility  Bed Mobility Overal bed mobility: Modified Independent             General bed mobility comments: supine<>sit with HOB elevated but not reliant on it    Transfers Overall transfer level: Needs assistance   Transfers: Sit to/from Stand Sit to Stand: Supervision           General transfer comment: sit<>stand from EOB    Ambulation/Gait Ambulation/Gait assistance: Supervision, Contact guard assist Gait Distance (Feet): 250 Feet Assistive device: Rolling walker (2 wheels) Gait Pattern/deviations: Decreased step length - right, Decreased stride length, Step-through  pattern Gait velocity: decreased         Stairs             Wheelchair Mobility     Tilt Bed    Modified Rankin (Stroke Patients Only)       Balance Overall balance assessment: Needs assistance   Sitting balance-Leahy Scale: Good     Standing balance support: During functional activity, Bilateral upper extremity supported, Reliant on assistive device for balance Standing balance-Leahy Scale: Fair                              Hotel manager: No apparent difficulties  Cognition Arousal: Alert Behavior During Therapy: WFL for tasks assessed/performed   PT - Cognitive impairments: No apparent impairments                         Following commands: Intact      Cueing Cueing Techniques: Verbal cues  Exercises      General Comments        Pertinent Vitals/Pain Pain Assessment Pain Assessment: No/denies pain    Home Living                          Prior Function            PT Goals (current goals can now be found in the care plan section) Acute Rehab PT Goals Patient Stated Goal: return home with brother PT Goal Formulation: With patient Time  For Goal Achievement: 08/22/23 Potential to Achieve Goals: Good Progress towards PT goals: Progressing toward goals    Frequency    Min 2X/week      PT Plan      Co-evaluation              AM-PAC PT 6 Clicks Mobility   Outcome Measure  Help needed turning from your back to your side while in a flat bed without using bedrails?: None Help needed moving from lying on your back to sitting on the side of a flat bed without using bedrails?: None Help needed moving to and from a bed to a chair (including a wheelchair)?: A Little Help needed standing up from a chair using your arms (e.g., wheelchair or bedside chair)?: A Little Help needed to walk in hospital room?: A Little Help needed climbing 3-5 steps with a railing? : A Little 6  Click Score: 20    End of Session   Activity Tolerance: Patient tolerated treatment well Patient left: in bed;with call bell/phone within reach;with bed alarm set Nurse Communication: Mobility status PT Visit Diagnosis: Muscle weakness (generalized) (M62.81);Other abnormalities of gait and mobility (R26.89)     Time: 1413-1430 PT Time Calculation (min) (ACUTE ONLY): 17 min  Charges:    $Therapeutic Activity: 8-22 mins PT General Charges $$ ACUTE PT VISIT: 1 Visit                     Richerd Pinal, PT, DPT 08/09/23, 2:37 PM   Richerd CHRISTELLA Pinal 08/09/2023, 2:36 PM

## 2023-08-09 NOTE — Plan of Care (Signed)

## 2023-08-09 NOTE — Discharge Summary (Signed)
 Wyatt Ball. FMW:[id number] DOB: December 04, 1960 DOA: 08/06/2023  PCP: System, Provider Not In  Admit date: 08/06/2023 Discharge date: 08/09/2023  Time spent: 35 minutes  Recommendations for Outpatient Follow-up:  Pcp f/u 2 weeks at the [facility] as scheduled, attention to kidney function then Attention to BP at f/u    Discharge Diagnoses:  Principal Problem:   AKI (acute kidney injury) (HCC) Active Problems:   Chronic, continuous use of opioids   Type 2 diabetes mellitus with hyperlipidemia (HCC)   Stroke (cerebrum) (HCC)   Seizure (HCC)   Discharge Condition: improved  Diet recommendation: heart healthy  Filed Weights   08/06/23 1126  Weight: 99.8 kg    History of present illness:  From admission h and p Wyatt Ball. is a 63 y.o. male with medical history significant of Anxiety, DMII , HTN,PTSD, hx of transverse myelitis, CVA, PRES, Seizure d/o , CKDIIla-b,  hx of urinary retention in setting of hypospadias, on CIC, who presents to ED after family noted patient having acute mental status change as well as n/v/d. Per family patient 2 days ago started running a fever and having intermittent n/v. Today patient became acutely agitated and spacing around the room and yelling.. IN the field EMS notes patient is ambulatory with stable vitals but with episodes of n/v/d per triage note.  Patient currently notes no sob/n/v/ does have some mild abdominal pain. Somnolent not able to give history. But noted to be calm and follows commands well.    Hospital Course:   # Acute encephalopathy Etiology unclear initially but now appears to be 2/2 uti as has resolved with antibiotics (and time). Was admitted with similar presentation in 2022 and diagnosed with PRES, here only able to hold still for non-con portion of mri, nothing acute seen. Did also have aki on ckd 4. Does have hx seizure but no report of seizure-like activity. UDS no drugs of abuse - f/u results of EEG (pending at time of  d/c)   # AKI on ckd 4 # Metabolic acidosis Baseline cr appears to be in the 3s as of late per [place] records. Here 4.15 on presentation, improved to 3.63 today. CT abdomen/pelvis (and inexplicably also a f/u renal u/s) shows no obstruction. - started sodium bicarb   # Debility PT advising home health   # Acute cystitis Urinalysis abnormal, does CIC at home. Per admitting MD history ESBL uti. Enterococcus growing in culture, pan-sensitive - d/c home with course amoxicillin   # Bacteremia Staph capitis in 1/4 cultures. Likely contaminant   # Nausea No vomiting, tolerating diet, ct abdomen/pelvis nothing acute. Resolved   # Chronic pain - resumed home buprenorphine , oxy prn, duloxetine    # Diarrhea Per family report, none here   # Neurogenic bladder Hx transverse myelitis. CICs at home. Likely cause of above uti - Foley here, discontinued at time of d/c   # History seizure - eeg as above - home keppra    # Hypertensive urgency Bp in 200s on arrival, improved but labile.  - our med rec appears incorrect as patient endorses taking different meds at home - advised close f/u with PCP   # T2DM Appears to be diet controlled, sugars are appropriate   Procedures: none   Consultations: none  Discharge Exam: Vitals:   08/09/23 0450 08/09/23 0730  BP: (!) 139/94 (!) 170/69  Pulse: 68 65  Resp: 17 14  Temp: 98.2 F (36.8 C) 98 F (36.7 C)  SpO2: 100% 97%    General: NAD  Cardiovascular: RRR Respiratory: CTAB  Discharge Instructions   Discharge Instructions     Diet - low sodium heart healthy   Complete by: As directed    Increase activity slowly   Complete by: As directed       Allergies as of 08/09/2023       Reactions   Lactose Intolerance (gi) Nausea And Vomiting   Methadone Nausea And Vomiting   Molds & Smuts Nausea And Vomiting   Sulfa Antibiotics Rash, Hives   Other reaction(s): Urticaria, ANGIOEDEMA OF LIPS        Medication List     STOP  taking these medications    gabapentin  100 MG capsule Commonly known as: NEURONTIN        TAKE these medications    albuterol  108 (90 Base) MCG/ACT inhaler Commonly known as: VENTOLIN  HFA Inhale 2 puffs into the lungs every 4 (four) hours as needed for shortness of breath.   amLODipine  10 MG tablet Commonly known as: NORVASC  Take 1 tablet (10 mg total) by mouth daily.   amoxicillin  500 MG capsule Commonly known as: AMOXIL  Take 1 capsule (500 mg total) by mouth every 12 (twelve) hours for 5 days.   atorvastatin  40 MG tablet Commonly known as: LIPITOR Take 1 tablet (40 mg total) by mouth daily.   B-12 1000 MCG Tabs Take 1,000 mcg by mouth daily.   Buprenorphine  HCl 600 MCG Film Place 600 mcg inside cheek every 12 (twelve) hours.   cloNIDine  0.3 mg/24hr patch Commonly known as: CATAPRES  - Dosed in mg/24 hr Place 1 patch (0.3 mg total) onto the skin once a week.   diphenhydrAMINE  25 mg capsule Commonly known as: BENADRYL  Take 25 mg by mouth daily as needed for allergies.   DULoxetine  60 MG capsule Commonly known as: CYMBALTA  Take 1 capsule (60 mg total) by mouth daily.   feeding supplement (NEPRO CARB STEADY) Liqd Take 237 mLs by mouth 3 (three) times daily between meals.   hydrOXYzine  25 MG tablet Commonly known as: ATARAX  Take 25 mg by mouth every 6 (six) hours as needed for anxiety.   labetalol  200 MG tablet Commonly known as: NORMODYNE  Take 1 tablet (200 mg total) by mouth 3 (three) times daily.   levETIRAcetam  500 MG tablet Commonly known as: KEPPRA  Take 1 tablet (500 mg total) by mouth 2 (two) times daily.   omeprazole 20 MG capsule Commonly known as: PRILOSEC Take 20 mg by mouth daily before breakfast.   oxyCODONE -acetaminophen  5-325 MG tablet Commonly known as: PERCOCET/ROXICET Take 1 tablet by mouth 2 (two) times daily as needed for severe pain.   prazosin  1 MG capsule Commonly known as: MINIPRESS  Take 1 capsule (1 mg total) by mouth 2 (two)  times daily as needed (PTSD symptoms). May take an additional 1 mg tablet along with 4 mg at bedtime as needed   promethazine  25 MG tablet Commonly known as: PHENERGAN  Take 25 mg by mouth 3 (three) times daily as needed for nausea.   QUEtiapine  25 MG tablet Commonly known as: SEROQUEL  Take 1 tablet (25 mg total) by mouth at bedtime.   Semaglutide(0.25 or 0.5MG /DOS) 2 MG/3ML Sopn Inject 0.5 mg into the skin once a week.   sodium bicarbonate  650 MG tablet Take 1 tablet (650 mg total) by mouth 2 (two) times daily.   sodium zirconium cyclosilicate  10 g Pack packet Commonly known as: LOKELMA  Take 10 g by mouth daily.       Allergies  Allergen Reactions   Lactose Intolerance (Gi)  Nausea And Vomiting   Methadone Nausea And Vomiting   Molds & Smuts Nausea And Vomiting   Sulfa Antibiotics Rash and Hives    Other reaction(s): Urticaria, ANGIOEDEMA OF LIPS      The results of significant diagnostics from this hospitalization (including imaging, microbiology, ancillary and laboratory) are listed below for reference.    Significant Diagnostic Studies: MR BRAIN WO CONTRAST Result Date: 08/08/2023 EXAM: MRI BRAIN WITHOUT CONTRAST 08/08/2023 03:20:00 PM TECHNIQUE: Multiplanar multisequence MRI of the head/brain was performed without the administration of intravenous contrast. Only axial diffusion images were obtained due to patient intolerance. COMPARISON: CT of the head dated 08/06/2023. CLINICAL HISTORY: Encephalopathy, hypertension, history of PRES. Altered mental status yesterday with partial resolution today. FINDINGS: BRAIN AND VENTRICLES: No evidence of restricted diffusion to indicate acute or recent infarction. There is generalized cerebral volume loss. The brain otherwise appears normal given the limitations of the study. No intracranial hemorrhage. No mass. No midline shift. No hydrocephalus. The sella is unremarkable. Normal flow voids. ORBITS: No acute abnormality. SINUSES AND  MASTOIDS: No acute abnormality. BONES AND SOFT TISSUES: Normal marrow signal. No acute soft tissue abnormality. IMPRESSION: 1. No acute findings on limited study. Electronically signed by: evalene coho 08/08/2023 04:37 PM EDT RP Workstation: [id number]   US  RENAL Result Date: 08/06/2023 CLINICAL DATA:  Urinary tract infection EXAM: RENAL / URINARY TRACT ULTRASOUND COMPLETE COMPARISON:  CT 08/06/2023 FINDINGS: Right Kidney: Renal measurements: 11.8 x 5.8 x 6.6 cm = volume: 239 mL. Echogenicity within normal limits. No mass or hydronephrosis visualized. Left Kidney: Renal measurements: 11.4 x 6 x 6.4 cm = volume: 229 mL. Echogenicity within normal limits. No mass or hydronephrosis visualized. Bladder: Bladder is partially decompressed with a Foley catheter. Bladder wall thickening may be due to under distention or cystitis. Correlate with urinalysis. Suggestion of a diverticulum along the dome of the bladder but this could just be due to under distention. Other: None. IMPRESSION: 1. Normal stone appearance of the kidneys.  No hydronephrosis. 2. Foley catheter decompresses the bladder. Bladder wall thickening may be due to under distention or cystitis. Appearance of small diverticulum at the dome of the bladder could be a true diverticulum or artifact from under distention. Electronically Signed   By: Elsie Gravely M.D.   On: 08/06/2023 17:59   CT ABDOMEN PELVIS WO CONTRAST Result Date: 08/06/2023 CLINICAL DATA:  Acute kidney injury, evaluate for obstructive process EXAM: CT ABDOMEN AND PELVIS WITHOUT CONTRAST TECHNIQUE: Multidetector CT imaging of the abdomen and pelvis was performed following the standard protocol without IV contrast. RADIATION DOSE REDUCTION: This exam was performed according to the departmental dose-optimization program which includes automated exposure control, adjustment of the mA and/or kV according to patient size and/or use of iterative reconstruction technique. COMPARISON:  July 13, 2020, Jun 04, 2020 FINDINGS: Of note, the lack of intravenous contrast limits evaluation of the solid organ parenchyma and vascularity. The study is degraded by patient's arm positioning. Lower chest: No focal airspace consolidation or pleural effusion. Mild cardiomegaly. Hepatobiliary: No mass.No radiopaque stones or wall thickening of the gallbladder. No intrahepatic or extrahepatic biliary ductal dilation. Pancreas: No mass or main ductal dilation. No peripancreatic inflammation or fluid collection. Spleen: Normal size. No mass. Adrenals/Urinary Tract: No adrenal masses. Small 1 cm right lower pole renal cyst. No hydronephrosis or nephrolithiasis. The urinary bladder is partially obscured by metallic streak artifact from orthopedic hardware. Urinary catheter partially visualized. Stomach/Bowel: The stomach is decompressed without focal abnormality. No small bowel wall  thickening or inflammation. No small bowel obstruction.Normal appendix. Descending and sigmoid colonic diverticulosis. No changes of acute diverticulitis. Vascular/Lymphatic: No aortic aneurysm. Diffuse aortoiliac atherosclerosis. No intraabdominal or pelvic lymphadenopathy. Reproductive: The prostate was not well visualized or evaluated due to metallic streak artifact from both hip arthroplasties. No free pelvic fluid. Other: No pneumoperitoneum, ascites, or mesenteric inflammation. Musculoskeletal: No acute fracture or destructive lesion. Both hip arthroplasties are anatomically aligned without dislocation. Large Schmorl's node along the superior endplate of T12 with adjacent interosseous hemangioma. IMPRESSION: 1. No acute intra-abdominal or pelvic abnormality. 2. Descending and sigmoid colonic diverticulosis. No changes of acute diverticulitis. 3. Partially visualized urinary catheter. The retention balloon and distal ureters bilaterally were not well visualized or evaluated due to metallic streak artifact from both hip arthroplasties.  Otherwise, no visualized hydroureteronephrosis or nephrolithiasis within either kidney. Aortic Atherosclerosis (ICD10-I70.0). Electronically Signed   By: Rogelia Myers M.D.   On: 08/06/2023 16:59   CT HEAD WO CONTRAST Result Date: 08/06/2023 CLINICAL DATA:  63 year old male with altered mental status, found confused at home. EXAM: CT HEAD WITHOUT CONTRAST TECHNIQUE: Contiguous axial images were obtained from the base of the skull through the vertex without intravenous contrast. RADIATION DOSE REDUCTION: This exam was performed according to the departmental dose-optimization program which includes automated exposure control, adjustment of the mA and/or kV according to patient size and/or use of iterative reconstruction technique. COMPARISON:  Brain MRI, Head CT 10/14/2020. FINDINGS: Brain: Stable cerebral volume since 2022. Normalized gray-white differentiation since that time when posterior reversible encephalopathy syndrome (PRES) was suspected by CT and MRI. No midline shift, ventriculomegaly, mass effect, evidence of mass lesion, intracranial hemorrhage or evidence of cortically based acute infarction. No cortical encephalomalacia identified. Vascular: Calcified atherosclerosis at the skull base. No suspicious intracranial vascular hyperdensity. Skull: Mild motion artifact. No acute osseous abnormality identified. Sinuses/Orbits: Visualized paranasal sinuses and mastoids are clear. Other: No acute orbit or scalp soft tissue finding. IMPRESSION: 1. No acute intracranial abnormality. 2. Normalized gray-white differentiation compared to 2022 CT/MRI when PRES was suspected on imaging. Electronically Signed   By: VEAR Hurst M.D.   On: 08/06/2023 12:25   DG Chest Port 1 View Result Date: 08/06/2023 CLINICAL DATA:  Questionable sepsis - evaluate for abnormality. Altered mental status. EXAM: PORTABLE CHEST 1 VIEW COMPARISON:  10/14/2020. FINDINGS: Low lung volume. Bilateral lung fields are clear. Bilateral  costophrenic angles are clear. Stable cardio-mediastinal silhouette. No acute osseous abnormalities. Right reverse shoulder arthroplasty noted. The soft tissues are within normal limits. IMPRESSION: No active disease. Electronically Signed   By: Ree Molt M.D.   On: 08/06/2023 12:07    Microbiology: Recent Results (from the past 240 hours)  Blood culture (routine single)     Status: Abnormal   Collection Time: 08/06/23 11:47 AM   Specimen: BLOOD LEFT FOREARM  Result Value Ref Range Status   Specimen Description   Final    BLOOD LEFT FOREARM Performed at [facility], [address] [place], [place] [address]    Special Requests   Final    BOTTLES DRAWN AEROBIC AND ANAEROBIC Blood Culture results may not be optimal due to an inadequate volume of blood received in culture bottles Performed at [facility], [address] [place], [place] [address]    Culture  Setup Time   Final    GRAM POSITIVE COCCI AEROBIC BOTTLE ONLY CRITICAL RESULT CALLED TO, READ BACK BY AND VERIFIED WITH: PHARMD TREY GREENWOOD [id number] BY J RAZZAQK, MT  Culture (A)  Final    STAPHYLOCOCCUS CAPITIS THE SIGNIFICANCE OF ISOLATING THIS ORGANISM FROM A SINGLE SET OF BLOOD CULTURES WHEN MULTIPLE SETS ARE DRAWN IS UNCERTAIN. PLEASE NOTIFY THE MICROBIOLOGY DEPARTMENT WITHIN ONE WEEK IF SPECIATION AND SENSITIVITIES ARE REQUIRED. Performed at [facility], [address], [place], [place] [address]    Report Status 08/08/2023 FINAL  Final  Resp panel by RT-PCR (RSV, Flu A&B, Covid) Anterior Nasal Swab     Status: None   Collection Time: 08/06/23 11:47 AM   Specimen: Anterior Nasal Swab  Result Value Ref Range Status   SARS Coronavirus 2 by RT PCR NEGATIVE NEGATIVE Final    Comment: (NOTE) SARS-CoV-2 target nucleic acids are NOT DETECTED.  The SARS-CoV-2 RNA is generally detectable in upper respiratory specimens during the acute phase of infection. The lowest concentration of  SARS-CoV-2 viral copies this assay can detect is 138 copies/mL. A negative result does not preclude SARS-Cov-2 infection and should not be used as the sole basis for treatment or other patient management decisions. A negative result may occur with  improper specimen collection/handling, submission of specimen other than nasopharyngeal swab, presence of viral mutation(s) within the areas targeted by this assay, and inadequate number of viral copies(<138 copies/mL). A negative result must be combined with clinical observations, patient history, and epidemiological information. The expected result is Negative.  Fact Sheet for Patients:  BloggerCourse.com  Fact Sheet for Healthcare Providers:  SeriousBroker.it  This test is no t yet approved or cleared by the [place]  FDA and  has been authorized for detection and/or diagnosis of SARS-CoV-2 by FDA under an Emergency Use Authorization (EUA). This EUA will remain  in effect (meaning this test can be used) for the duration of the COVID-19 declaration under Section 564(b)(1) of the Act, 21 U.S.C.section 360bbb-3(b)(1), unless the authorization is terminated  or revoked sooner.       Influenza A by PCR NEGATIVE NEGATIVE Final   Influenza B by PCR NEGATIVE NEGATIVE Final    Comment: (NOTE) The Xpert Xpress SARS-CoV-2/FLU/RSV plus assay is intended as an aid in the diagnosis of influenza from Nasopharyngeal swab specimens and should not be used as a sole basis for treatment. Nasal washings and aspirates are unacceptable for Xpert Xpress SARS-CoV-2/FLU/RSV testing.  Fact Sheet for Patients: BloggerCourse.com  Fact Sheet for Healthcare Providers: SeriousBroker.it  This test is not yet approved or cleared by the [place]  FDA and has been authorized for detection and/or diagnosis of SARS-CoV-2 by FDA under an Emergency Use  Authorization (EUA). This EUA will remain in effect (meaning this test can be used) for the duration of the COVID-19 declaration under Section 564(b)(1) of the Act, 21 U.S.C. section 360bbb-3(b)(1), unless the authorization is terminated or revoked.     Resp Syncytial Virus by PCR NEGATIVE NEGATIVE Final    Comment: (NOTE) Fact Sheet for Patients: BloggerCourse.com  Fact Sheet for Healthcare Providers: SeriousBroker.it  This test is not yet approved or cleared by the [place]  FDA and has been authorized for detection and/or diagnosis of SARS-CoV-2 by FDA under an Emergency Use Authorization (EUA). This EUA will remain in effect (meaning this test can be used) for the duration of the COVID-19 declaration under Section 564(b)(1) of the Act, 21 U.S.C. section 360bbb-3(b)(1), unless the authorization is terminated or revoked.  Performed at [facility], [address] [place], [place] [address]   Blood Culture ID Panel (Reflexed)     Status: Abnormal   Collection Time:  08/06/23 11:47 AM  Result Value Ref Range Status   Enterococcus faecalis NOT DETECTED NOT DETECTED Final   Enterococcus Faecium NOT DETECTED NOT DETECTED Final   Listeria monocytogenes NOT DETECTED NOT DETECTED Final   Staphylococcus species DETECTED (A) NOT DETECTED Final    Comment: CRITICAL RESULT CALLED TO, READ BACK BY AND VERIFIED WITH: PHARMD TREY GREENWOOD [id number] BY J RAZZAK, MT    Staphylococcus aureus (BCID) NOT DETECTED NOT DETECTED Final   Staphylococcus epidermidis NOT DETECTED NOT DETECTED Final   Staphylococcus lugdunensis NOT DETECTED NOT DETECTED Final   Streptococcus species NOT DETECTED NOT DETECTED Final   Streptococcus agalactiae NOT DETECTED NOT DETECTED Final   Streptococcus pneumoniae NOT DETECTED NOT DETECTED Final   Streptococcus pyogenes NOT DETECTED NOT DETECTED Final   A.calcoaceticus-baumannii NOT DETECTED NOT  DETECTED Final   Bacteroides fragilis NOT DETECTED NOT DETECTED Final   Enterobacterales NOT DETECTED NOT DETECTED Final   Enterobacter cloacae complex NOT DETECTED NOT DETECTED Final   Escherichia coli NOT DETECTED NOT DETECTED Final   Klebsiella aerogenes NOT DETECTED NOT DETECTED Final   Klebsiella oxytoca NOT DETECTED NOT DETECTED Final   Klebsiella pneumoniae NOT DETECTED NOT DETECTED Final   Proteus species NOT DETECTED NOT DETECTED Final   Salmonella species NOT DETECTED NOT DETECTED Final   Serratia marcescens NOT DETECTED NOT DETECTED Final   Haemophilus influenzae NOT DETECTED NOT DETECTED Final   Neisseria meningitidis NOT DETECTED NOT DETECTED Final   Pseudomonas aeruginosa NOT DETECTED NOT DETECTED Final   Stenotrophomonas maltophilia NOT DETECTED NOT DETECTED Final   Candida albicans NOT DETECTED NOT DETECTED Final   Candida auris NOT DETECTED NOT DETECTED Final   Candida glabrata NOT DETECTED NOT DETECTED Final   Candida krusei NOT DETECTED NOT DETECTED Final   Candida parapsilosis NOT DETECTED NOT DETECTED Final   Candida tropicalis NOT DETECTED NOT DETECTED Final   Cryptococcus neoformans/gattii NOT DETECTED NOT DETECTED Final    Comment: Performed at [facility], [address] [place], [place] [address]  Urine Culture     Status: Abnormal (Preliminary result)   Collection Time: 08/06/23  2:05 PM   Specimen: Urine, Random  Result Value Ref Range Status   Specimen Description   Final    URINE, RANDOM Performed at [facility], [address] [place], [place] [address]    Special Requests   Final    NONE Reflexed from [id number] Performed at [facility], [address] [place], [place] [address]    Culture (A)  Final    >=100,000 COLONIES/mL ENTEROCOCCUS FAECALIS 80,000 COLONIES/mL STAPHYLOCOCCUS EPIDERMIDIS SUSCEPTIBILITIES TO FOLLOW Performed at [facility], [address], [place], [place] [address]    Report Status PENDING   Incomplete   Organism ID, Bacteria ENTEROCOCCUS FAECALIS (A)  Final      Susceptibility   Enterococcus faecalis - MIC*    AMPICILLIN  <=2 SENSITIVE Sensitive     NITROFURANTOIN <=16 SENSITIVE Sensitive     VANCOMYCIN  1 SENSITIVE Sensitive     * >=100,000 COLONIES/mL ENTEROCOCCUS FAECALIS  Culture, blood (single)     Status: None (Preliminary result)   Collection Time: 08/06/23  6:40 PM   Specimen: BLOOD  Result Value Ref Range Status   Specimen Description BLOOD BLOOD RIGHT ARM  Final   Special Requests   Final    AEROBIC BOTTLE ONLY Blood Culture results may not be optimal due to an inadequate volume of blood received in culture bottles   Culture  Final    NO GROWTH 2 DAYS Performed at [facility], [address] [place] [place], [place] [address]    Report Status PENDING  Incomplete     Labs: Basic Metabolic Panel: Recent Labs  Lab 08/06/23 1147 08/07/23 0347 08/08/23 0542 08/09/23 0508  NA 140 143 141 135  K 4.5 4.2 4.3 4.2  CL 104 108 109 108  CO2 19* 20* 18* 19*  GLUCOSE 124* 117* 116* 144*  BUN 56* 61* 59* 54*  CREATININE 4.15* 3.96* 3.73* 3.61*  CALCIUM  10.4* 9.2 8.6* 7.8*   Liver Function Tests: Recent Labs  Lab 08/06/23 1147 08/07/23 0347  AST 17 17  ALT 11 11  ALKPHOS 103 82  BILITOT 1.0 0.6  PROT 7.7 6.7  ALBUMIN  3.9 3.5   No results for input(s): LIPASE, AMYLASE in the last 168 hours. No results for input(s): AMMONIA in the last 168 hours. CBC: Recent Labs  Lab 08/06/23 1147 08/07/23 0347 08/08/23 0542  WBC 11.3* 9.1 6.1  NEUTROABS 9.6*  --   --   HGB 12.8* 11.1* 10.8*  HCT 38.4* 34.1* 32.5*  MCV 88.1 89.0 89.0  PLT 284 235 211   Cardiac Enzymes: Recent Labs  Lab 08/06/23 1147  CKTOTAL 356   BNP: BNP (last 3 results) No results for input(s): BNP in the last 8760 hours.  ProBNP (last 3 results) No results for input(s): PROBNP in the last 8760 hours.  CBG: Recent Labs  Lab 08/06/23 1807 08/07/23 0740  08/07/23 1200 08/07/23 1944  GLUCAP 108* 104* 130* 133*       Signed:  Devaughn KATHEE Ban MD.  [facility] 08/09/2023, 2:08 PM

## 2023-08-09 NOTE — TOC Transition Note (Signed)
 Transition of Care [facility]) - Discharge Note   Patient Details  Name: Wyatt Ball. MRN: [id number] Date of Birth: 20-May-1960  Transition of Care [facility]) CM/SW Contact:  Wyatt CHRISTELLA Jaksch, RN Phone Number: 08/09/2023, 4:16 PM   Clinical Narrative:     Spoke with patient at bedside. Daughter to order Wyatt Ball to transport home. Discussed home health PT with patient. He is agreeable to services. He states that he has no preference of agency however, he has used an agency in the past. Per Wyatt Ball, patient has used [facility] in the past. Per Wyatt Ball she will accept patient for home health PT services.    Final next level of care: Home w Home Health Services Barriers to Discharge: Barriers Resolved   Patient Goals and CMS Choice Patient states their goals for this hospitalization and ongoing recovery are:: Going home          Discharge Placement                       Discharge Plan and Services Additional resources added to the After Visit Summary for                            [facility] Arranged: PT [facility] Agency: [facility] Date [facility] Agency Contacted: 08/09/23   Representative spoke with at [facility] Agency: Wyatt Ball  Social Drivers of Health (SDOH) Interventions SDOH Screenings   Tobacco Use: High Risk (10/18/2020)     Readmission Risk Interventions     No data to display

## 2023-08-09 NOTE — Progress Notes (Signed)
 Pt does in and out cath's at home so he was allowed to be discharged immediately after having his foley pulled. He said he has all the supplies he needs at home.

## 2023-08-09 NOTE — Procedures (Signed)
 Patient Name: Wyatt Ball.  MRN: [id number]  Epilepsy Attending: Pastor Falling  Referring Physician/Provider: No ref. provider found      Date: 08/09/2023 Duration: 23 minutes   Patient history:  63 y.o. male with medical history significant of Anxiety, DMII , HTN,PTSD, hx of transverse myelitis, CVA, PRES, Seizure d/o , CKDIIla-b,  hx of urinary retention in setting of hypospadias, on CIC, who presents to ED after family noted patient having acute mental status change as well as n/v/d. EEG to rule out seizure  Level of alertness: Awake  AEDs during EEG study: LEV  Technical aspects: This EEG study was done with scalp electrodes positioned according to the 10-20 International system of electrode placement. Electrical activity was reviewed with band pass filter of 1-70Hz , sensitivity of 7 uV/mm, display speed of 71mm/sec with a 60Hz  notched filter applied as appropriate. EEG data were recorded continuously and digitally stored.  Video monitoring was available and reviewed as appropriate.  Description: The posterior dominant rhythm consists of 9 Hz activity of moderate voltage (25-35 uV) seen predominantly in posterior head regions, symmetric and reactive to eye opening and eye closing. Drowsiness was not seen. Sleep was not seen. Hyperventilation and photic stimulation were not performed.     ABNORMALITY -None   IMPRESSION: This study is within normal limits. No seizures or epileptiform discharges were seen throughout the recording.  A normal interictal EEG does not exclude nor support the diagnosis of epilepsy.   Pastor Falling MD Neurology

## 2023-08-09 NOTE — Progress Notes (Signed)
 Inpatient Rehab Admissions Coordinator:    I spoke with evaluating PT and she is in agreement  that Pt. Could tolerate CIR . I will place a consult.   Leita Kleine, MS, CCC-SLP Rehab Admissions Coordinator  [phone] (celll) [phone] (office)

## 2023-08-10 LAB — URINE CULTURE: Culture: >=100000 — AB

## 2023-08-11 LAB — CULTURE, BLOOD (SINGLE): Culture: NO GROWTH
